# Patient Record
Sex: Male | Born: 1955
Health system: Southern US, Community
[De-identification: ages and names within clinical notes are randomized; demographics above are authoritative.]

## PROBLEM LIST (undated history)

## (undated) DIAGNOSIS — B1920 Unspecified viral hepatitis C without hepatic coma: Secondary | ICD-10-CM

## (undated) DIAGNOSIS — I10 Essential (primary) hypertension: Secondary | ICD-10-CM

## (undated) DIAGNOSIS — R0602 Shortness of breath: Secondary | ICD-10-CM

## (undated) DIAGNOSIS — F329 Major depressive disorder, single episode, unspecified: Secondary | ICD-10-CM

## (undated) DIAGNOSIS — D696 Thrombocytopenia, unspecified: Secondary | ICD-10-CM

## (undated) DIAGNOSIS — R9431 Abnormal electrocardiogram [ECG] [EKG]: Secondary | ICD-10-CM

## (undated) DIAGNOSIS — I864 Gastric varices: Secondary | ICD-10-CM

## (undated) DIAGNOSIS — K4090 Unilateral inguinal hernia, without obstruction or gangrene, not specified as recurrent: Secondary | ICD-10-CM

## (undated) DIAGNOSIS — R188 Other ascites: Secondary | ICD-10-CM

## (undated) DIAGNOSIS — K7460 Unspecified cirrhosis of liver: Secondary | ICD-10-CM

## (undated) DIAGNOSIS — C801 Malignant (primary) neoplasm, unspecified: Secondary | ICD-10-CM

## (undated) DIAGNOSIS — F32A Depression, unspecified: Secondary | ICD-10-CM

## (undated) DIAGNOSIS — F1020 Alcohol dependence, uncomplicated: Secondary | ICD-10-CM

## (undated) DIAGNOSIS — I8500 Esophageal varices without bleeding: Secondary | ICD-10-CM

## (undated) DIAGNOSIS — H9190 Unspecified hearing loss, unspecified ear: Secondary | ICD-10-CM

## (undated) HISTORY — DX: Esophageal varices without bleeding: I85.00

## (undated) HISTORY — PX: CIRCUMCISION: SUR203

## (undated) HISTORY — PX: COLONOSCOPY: SHX174

## (undated) HISTORY — DX: Unspecified cirrhosis of liver: K74.60

## (undated) HISTORY — DX: Gastric varices: I86.4

---

## 1998-05-26 ENCOUNTER — Emergency Department (HOSPITAL_COMMUNITY): Admission: EM | Admit: 1998-05-26 | Discharge: 1998-05-26 | Payer: Self-pay | Admitting: Emergency Medicine

## 1998-05-28 ENCOUNTER — Emergency Department (HOSPITAL_COMMUNITY): Admission: EM | Admit: 1998-05-28 | Discharge: 1998-05-28 | Payer: Self-pay | Admitting: Emergency Medicine

## 1999-05-15 ENCOUNTER — Emergency Department (HOSPITAL_COMMUNITY): Admission: EM | Admit: 1999-05-15 | Discharge: 1999-05-15 | Payer: Self-pay | Admitting: *Deleted

## 1999-05-16 ENCOUNTER — Encounter: Payer: Self-pay | Admitting: *Deleted

## 1999-05-16 ENCOUNTER — Encounter: Payer: Self-pay | Admitting: Emergency Medicine

## 1999-05-16 ENCOUNTER — Ambulatory Visit (HOSPITAL_COMMUNITY): Admission: RE | Admit: 1999-05-16 | Discharge: 1999-05-16 | Payer: Self-pay | Admitting: *Deleted

## 1999-05-16 ENCOUNTER — Emergency Department (HOSPITAL_COMMUNITY): Admission: EM | Admit: 1999-05-16 | Discharge: 1999-05-16 | Payer: Self-pay | Admitting: Emergency Medicine

## 2004-02-20 ENCOUNTER — Encounter: Admission: RE | Admit: 2004-02-20 | Discharge: 2004-02-20 | Payer: Self-pay | Admitting: Internal Medicine

## 2004-06-25 ENCOUNTER — Ambulatory Visit: Payer: Self-pay | Admitting: Internal Medicine

## 2005-07-04 ENCOUNTER — Ambulatory Visit: Payer: Self-pay | Admitting: Internal Medicine

## 2005-08-15 ENCOUNTER — Ambulatory Visit (HOSPITAL_COMMUNITY): Admission: RE | Admit: 2005-08-15 | Discharge: 2005-08-15 | Payer: Self-pay | Admitting: Internal Medicine

## 2005-08-15 ENCOUNTER — Encounter (INDEPENDENT_AMBULATORY_CARE_PROVIDER_SITE_OTHER): Payer: Self-pay | Admitting: Specialist

## 2005-08-15 ENCOUNTER — Encounter: Payer: Self-pay | Admitting: Internal Medicine

## 2005-08-15 ENCOUNTER — Ambulatory Visit: Payer: Self-pay | Admitting: Internal Medicine

## 2005-08-21 ENCOUNTER — Ambulatory Visit: Payer: Self-pay | Admitting: Internal Medicine

## 2005-08-21 LAB — CBC WITH DIFFERENTIAL/PLATELET
BASO%: 0.5 % (ref 0.0–2.0)
EOS%: 1.9 % (ref 0.0–7.0)
MCH: 35.6 pg — ABNORMAL HIGH (ref 28.0–33.4)
MCHC: 34.8 g/dL (ref 32.0–35.9)
MCV: 102.3 fL — ABNORMAL HIGH (ref 81.6–98.0)
MONO%: 20.2 % — ABNORMAL HIGH (ref 0.0–13.0)
NEUT%: 44.4 % (ref 40.0–75.0)
RDW: 14.1 % (ref 11.2–14.6)
lymph#: 1.3 10*3/uL (ref 0.9–3.3)

## 2005-08-28 LAB — HEMOCHROMATOSIS DNA-PCR(C282Y,H63D)

## 2005-10-10 ENCOUNTER — Ambulatory Visit: Payer: Self-pay | Admitting: Internal Medicine

## 2005-10-13 LAB — CBC WITH DIFFERENTIAL/PLATELET
BASO%: 0.7 % (ref 0.0–2.0)
EOS%: 2.1 % (ref 0.0–7.0)
LYMPH%: 37.2 % (ref 14.0–48.0)
MCH: 35.8 pg — ABNORMAL HIGH (ref 28.0–33.4)
MCHC: 34.9 g/dL (ref 32.0–35.9)
MCV: 102.6 fL — ABNORMAL HIGH (ref 81.6–98.0)
MONO#: 0.5 10*3/uL (ref 0.1–0.9)
MONO%: 15.6 % — ABNORMAL HIGH (ref 0.0–13.0)
Platelets: 40 10*3/uL — ABNORMAL LOW (ref 145–400)
RBC: 4.15 10*6/uL — ABNORMAL LOW (ref 4.20–5.71)
WBC: 3.2 10*3/uL — ABNORMAL LOW (ref 4.0–10.0)

## 2005-10-17 LAB — HEMOCHROMATOSIS DNA-PCR(C282Y,H63D)

## 2005-10-17 LAB — COMPREHENSIVE METABOLIC PANEL
BUN: 12 mg/dL (ref 6–23)
CO2: 25 mEq/L (ref 19–32)
Calcium: 8.8 mg/dL (ref 8.4–10.5)
Creatinine, Ser: 0.82 mg/dL (ref 0.40–1.50)
Glucose, Bld: 145 mg/dL — ABNORMAL HIGH (ref 70–99)
Total Bilirubin: 1.1 mg/dL (ref 0.3–1.2)

## 2005-10-17 LAB — LACTATE DEHYDROGENASE: LDH: 153 U/L (ref 94–250)

## 2005-10-17 LAB — HIV ANTIBODY (ROUTINE TESTING W REFLEX)

## 2006-06-17 ENCOUNTER — Ambulatory Visit: Payer: Self-pay | Admitting: Internal Medicine

## 2008-04-07 ENCOUNTER — Emergency Department (HOSPITAL_COMMUNITY): Admission: EM | Admit: 2008-04-07 | Discharge: 2008-04-07 | Payer: Self-pay | Admitting: Emergency Medicine

## 2008-09-16 ENCOUNTER — Emergency Department (HOSPITAL_COMMUNITY): Admission: EM | Admit: 2008-09-16 | Discharge: 2008-09-16 | Payer: Self-pay | Admitting: Emergency Medicine

## 2010-08-06 LAB — POCT I-STAT, CHEM 8
BUN: 10 mg/dL (ref 6–23)
Calcium, Ion: 1.03 mmol/L — ABNORMAL LOW (ref 1.12–1.32)
Chloride: 104 mEq/L (ref 96–112)
Creatinine, Ser: 1.1 mg/dL (ref 0.4–1.5)
Glucose, Bld: 117 mg/dL — ABNORMAL HIGH (ref 70–99)
HCT: 40 % (ref 39.0–52.0)
Hemoglobin: 13.6 g/dL (ref 13.0–17.0)
Potassium: 3.9 mEq/L (ref 3.5–5.1)
Sodium: 135 mEq/L (ref 135–145)
TCO2: 25 mmol/L (ref 0–100)

## 2013-04-25 ENCOUNTER — Encounter (HOSPITAL_COMMUNITY): Payer: Self-pay | Admitting: Emergency Medicine

## 2013-04-25 ENCOUNTER — Inpatient Hospital Stay (HOSPITAL_COMMUNITY)
Admission: EM | Admit: 2013-04-25 | Discharge: 2013-04-29 | DRG: 433 | Disposition: A | Discharge status: Home / Self Care | Payer: Medicaid Other | Attending: Internal Medicine | Admitting: Internal Medicine

## 2013-04-25 ENCOUNTER — Emergency Department (INDEPENDENT_AMBULATORY_CARE_PROVIDER_SITE_OTHER)
Admission: EM | Admit: 2013-04-25 | Discharge: 2013-04-25 | Disposition: A | Discharge status: Home / Self Care | Payer: Medicaid Other | Source: Home / Self Care | Attending: Emergency Medicine | Admitting: Emergency Medicine

## 2013-04-25 DIAGNOSIS — N390 Urinary tract infection, site not specified: Secondary | ICD-10-CM

## 2013-04-25 DIAGNOSIS — K7290 Hepatic failure, unspecified without coma: Secondary | ICD-10-CM

## 2013-04-25 DIAGNOSIS — R188 Other ascites: Secondary | ICD-10-CM | POA: Diagnosis present

## 2013-04-25 DIAGNOSIS — F1010 Alcohol abuse, uncomplicated: Secondary | ICD-10-CM | POA: Diagnosis present

## 2013-04-25 DIAGNOSIS — K7030 Alcoholic cirrhosis of liver without ascites: Principal | ICD-10-CM | POA: Diagnosis present

## 2013-04-25 DIAGNOSIS — F17200 Nicotine dependence, unspecified, uncomplicated: Secondary | ICD-10-CM | POA: Diagnosis present

## 2013-04-25 DIAGNOSIS — K7460 Unspecified cirrhosis of liver: Secondary | ICD-10-CM

## 2013-04-25 DIAGNOSIS — B1920 Unspecified viral hepatitis C without hepatic coma: Secondary | ICD-10-CM

## 2013-04-25 DIAGNOSIS — F1410 Cocaine abuse, uncomplicated: Secondary | ICD-10-CM | POA: Diagnosis present

## 2013-04-25 DIAGNOSIS — R601 Generalized edema: Secondary | ICD-10-CM

## 2013-04-25 DIAGNOSIS — I10 Essential (primary) hypertension: Secondary | ICD-10-CM

## 2013-04-25 DIAGNOSIS — L209 Atopic dermatitis, unspecified: Secondary | ICD-10-CM | POA: Diagnosis present

## 2013-04-25 DIAGNOSIS — L2089 Other atopic dermatitis: Secondary | ICD-10-CM | POA: Diagnosis present

## 2013-04-25 DIAGNOSIS — D696 Thrombocytopenia, unspecified: Secondary | ICD-10-CM

## 2013-04-25 DIAGNOSIS — D684 Acquired coagulation factor deficiency: Secondary | ICD-10-CM | POA: Diagnosis present

## 2013-04-25 DIAGNOSIS — R609 Edema, unspecified: Secondary | ICD-10-CM

## 2013-04-25 DIAGNOSIS — Z23 Encounter for immunization: Secondary | ICD-10-CM

## 2013-04-25 HISTORY — DX: Unspecified viral hepatitis C without hepatic coma: B19.20

## 2013-04-25 HISTORY — DX: Essential (primary) hypertension: I10

## 2013-04-25 HISTORY — DX: Other ascites: R18.8

## 2013-04-25 NOTE — ED Provider Notes (Signed)
Chief Complaint:   Chief Complaint  Patient presents with  . Leg Swelling  . Groin Swelling    History of Present Illness:   Jose [name] is a 57 year old male with hepatitis C and ongoing alcohol intake who presents tonight with a 2 to three-month history of swelling of both of his legs, right more so than left. He also notes swelling of his abdomen and tightness and over the past 2 days has had swelling of his scrotum and penis. The scrotal swelling is painful but he denies any abdominal or leg pain. He's had no fever, chills, nausea, or vomiting. He's had some cramping in his legs. He does not have a primary care physician and states he has never received any treatment for hepatitis C. He admits to drinking about 2-3 beers per day plus an unknown amount liquor.  Review of Systems:  Other than noted above, the patient denies any of the following symptoms: Systemic:  No fever, chills, sweats, weight gain or loss. Respiratory:  No coughing, wheezing, or shortness of breath. Cardiac:  No chest pain, tightness, pressure or syncope. GI:  No abdominal pain, swelling, distension, nausea, or vomiting. GU:  No dysuria, frequency, or hematuria. Ext:  No joint pain, muscle pain, or weakness. Skin:  No rash or itching. Neuro:  No paresthesias.  PMFSH:  Past medical history, family history, social history, meds, and allergies were reviewed.  The patient states he's had high blood pressure in the past but is not on any treatment for right now.  Physical Exam:   Vital signs:  BP 152/75  Pulse 60  Temp(Src) 98.3 F (36.8 C) (Oral)  Resp 14  SpO2 98% Gen:  Alert, oriented, in no distress. He has scleral icterus and fetor hepatis.  Neck:  No tenderness, adenopathy, or JVD. Lungs:  Breath sounds clear and equal bilaterally.  No rales, rhonchi or wheezes. Heart:  Regular rhythm, no gallops or murmers. Abdomen:  Was distended, tight, but not tender to palpation. Bowel sounds were normal. Genital exam:  He has massive swelling of the scrotum and penis. Ext:  He has pitting edema of both legs, right more so than left. Neuro:  Alert and oriented times 3.  No muscle weakness.  Sensation intact to light touch. Skin:  Warm and dry.  No rash or skin lesions.  Assessment:  The primary encounter diagnosis was Cirrhosis. Diagnoses of Ascites and Edema were also pertinent to this visit.  He will need diuresis and medical monitoring. He does not have a medical home, therefore will probably need hospitalization.  Plan:  The patient was transferred to the ED via shuttle in stable condition.  Medical Decision Making: 57 year old male with history of hepatitis C and ongoing alcohol intake presents tonight with a 2 to 3 month history of swelling of both legs, right greater than left, scrotum, and abdomen.  He denies fever, abdominal pain, or vomiting.  He has no primary care physician.  On exam he has ascites, scleral icterus, massive edema of his scrotum, and edema of both legs.  My impression is cirrhosis and he will need to be admitted for diuresis.        Reuben Likes, MD 04/25/13 [phone]

## 2013-04-25 NOTE — ED Notes (Signed)
Reports swelling of right leg and knee for over 2 to 3 months. States has gotten worse over the past week.   Pt is also having swelling in groin area over the last couple of days that is gradually getting worse daily.  No otc meds taken.  Hx of hypertension.

## 2013-04-25 NOTE — ED Notes (Signed)
Pt is an [facility] transfer.  Per [facility] papers, pt with gross swelling to scrotum and bilateral legs.  Pt states swelling has been ongoing for a couple months.

## 2013-04-26 ENCOUNTER — Inpatient Hospital Stay (HOSPITAL_COMMUNITY): Payer: Medicaid Other

## 2013-04-26 ENCOUNTER — Emergency Department (HOSPITAL_COMMUNITY): Payer: Medicaid Other

## 2013-04-26 ENCOUNTER — Encounter (HOSPITAL_COMMUNITY): Payer: Self-pay | Admitting: Internal Medicine

## 2013-04-26 DIAGNOSIS — R601 Generalized edema: Secondary | ICD-10-CM | POA: Diagnosis present

## 2013-04-26 DIAGNOSIS — M7989 Other specified soft tissue disorders: Secondary | ICD-10-CM

## 2013-04-26 DIAGNOSIS — R609 Edema, unspecified: Secondary | ICD-10-CM

## 2013-04-26 DIAGNOSIS — B1920 Unspecified viral hepatitis C without hepatic coma: Secondary | ICD-10-CM

## 2013-04-26 DIAGNOSIS — K769 Liver disease, unspecified: Secondary | ICD-10-CM

## 2013-04-26 DIAGNOSIS — I10 Essential (primary) hypertension: Secondary | ICD-10-CM

## 2013-04-26 DIAGNOSIS — N390 Urinary tract infection, site not specified: Secondary | ICD-10-CM | POA: Diagnosis present

## 2013-04-26 LAB — CBC WITH DIFFERENTIAL/PLATELET
Basophils Absolute: 0 10*3/uL (ref 0.0–0.1)
Eosinophils Absolute: 0.1 10*3/uL (ref 0.0–0.7)
Eosinophils Relative: 2 % (ref 0–5)
HCT: 38.3 % — ABNORMAL LOW (ref 39.0–52.0)
Lymphocytes Relative: 35 % (ref 12–46)
MCH: 39.1 pg — ABNORMAL HIGH (ref 26.0–34.0)
MCHC: 35.8 g/dL (ref 30.0–36.0)
MCV: 109.4 fL — ABNORMAL HIGH (ref 78.0–100.0)
Monocytes Absolute: 0.5 10*3/uL (ref 0.1–1.0)
Monocytes Relative: 14 % — ABNORMAL HIGH (ref 3–12)
Platelets: 35 10*3/uL — ABNORMAL LOW (ref 150–400)
RDW: 16.4 % — ABNORMAL HIGH (ref 11.5–15.5)
WBC: 3.6 10*3/uL — ABNORMAL LOW (ref 4.0–10.5)

## 2013-04-26 LAB — URINALYSIS, ROUTINE W REFLEX MICROSCOPIC
Ketones, ur: NEGATIVE mg/dL
Nitrite: NEGATIVE
Nitrite: POSITIVE — AB
Protein, ur: NEGATIVE mg/dL
Protein, ur: NEGATIVE mg/dL
Specific Gravity, Urine: 1.025 (ref 1.005–1.030)
Urobilinogen, UA: >8 mg/dL — ABNORMAL HIGH (ref 0.0–1.0)
Urobilinogen, UA: 4 mg/dL — ABNORMAL HIGH (ref 0.0–1.0)

## 2013-04-26 LAB — COMPREHENSIVE METABOLIC PANEL
AST: 139 U/L — ABNORMAL HIGH (ref 0–37)
BUN: 10 mg/dL (ref 6–23)
CO2: 26 mEq/L (ref 19–32)
Calcium: 7.8 mg/dL — ABNORMAL LOW (ref 8.4–10.5)
Creatinine, Ser: 0.85 mg/dL (ref 0.50–1.35)
GFR calc Af Amer: >90 mL/min (ref >90)
GFR calc non Af Amer: >90 mL/min (ref >90)
Glucose, Bld: 116 mg/dL — ABNORMAL HIGH (ref 70–99)
Sodium: 140 mEq/L (ref 137–147)
Total Bilirubin: 2 mg/dL — ABNORMAL HIGH (ref 0.3–1.2)

## 2013-04-26 LAB — URINE MICROSCOPIC-ADD ON

## 2013-04-26 LAB — RAPID URINE DRUG SCREEN, HOSP PERFORMED
Amphetamines: NOT DETECTED
Barbiturates: NOT DETECTED
Tetrahydrocannabinol: NOT DETECTED

## 2013-04-26 LAB — POTASSIUM: Potassium: 4.4 mEq/L (ref 3.7–5.3)

## 2013-04-26 LAB — HEPATITIS PANEL, ACUTE
HCV Ab: REACTIVE — AB
Hepatitis B Surface Ag: NEGATIVE

## 2013-04-26 LAB — AMMONIA: Ammonia: 79 umol/L — ABNORMAL HIGH (ref 11–60)

## 2013-04-26 LAB — ETHANOL: Alcohol, Ethyl (B): <11 mg/dL (ref 0–11)

## 2013-04-26 MED ORDER — VITAMIN B-1 100 MG PO TABS
100.0000 mg | ORAL_TABLET | Freq: Every day | ORAL | Status: DC
  Administered 2013-04-26 – 2013-04-29 (×4): 100 mg via ORAL
  Filled 2013-04-26 (×4): qty 1

## 2013-04-26 MED ORDER — LORAZEPAM 1 MG PO TABS
1.0000 mg | ORAL_TABLET | Freq: Four times a day (QID) | ORAL | Status: AC | PRN
  Administered 2013-04-27: 1 mg via ORAL
  Filled 2013-04-26: qty 1

## 2013-04-26 MED ORDER — DEXTROSE 5 % IV SOLN
1.0000 g | INTRAVENOUS | Status: DC
  Administered 2013-04-26 – 2013-04-27 (×2): 1 g via INTRAVENOUS
  Filled 2013-04-26 (×3): qty 10

## 2013-04-26 MED ORDER — NICOTINE 21 MG/24HR TD PT24
21.0000 mg | MEDICATED_PATCH | Freq: Every day | TRANSDERMAL | Status: DC
  Administered 2013-04-27 – 2013-04-29 (×3): 21 mg via TRANSDERMAL
  Filled 2013-04-26 (×4): qty 1

## 2013-04-26 MED ORDER — LORAZEPAM 2 MG/ML IJ SOLN: 1.0000 mg | Freq: Four times a day (QID) | INTRAMUSCULAR | Status: AC | PRN

## 2013-04-26 MED ORDER — FOLIC ACID 1 MG PO TABS
1.0000 mg | ORAL_TABLET | Freq: Every day | ORAL | Status: DC
  Administered 2013-04-26 – 2013-04-29 (×4): 1 mg via ORAL
  Filled 2013-04-26 (×4): qty 1

## 2013-04-26 MED ORDER — THIAMINE HCL 100 MG/ML IJ SOLN
100.0000 mg | Freq: Every day | INTRAMUSCULAR | Status: DC
  Filled 2013-04-26: qty 1

## 2013-04-26 MED ORDER — ADULT MULTIVITAMIN W/MINERALS CH
1.0000 | ORAL_TABLET | Freq: Every day | ORAL | Status: DC
  Administered 2013-04-26 – 2013-04-29 (×4): 1 via ORAL
  Filled 2013-04-26 (×4): qty 1

## 2013-04-26 MED ORDER — LACTULOSE 10 GM/15ML PO SOLN
20.0000 g | Freq: Every day | ORAL | Status: DC
  Administered 2013-04-26 – 2013-04-29 (×4): 20 g via ORAL
  Filled 2013-04-26 (×4): qty 30

## 2013-04-26 MED ORDER — ONDANSETRON HCL 4 MG PO TABS: 4.0000 mg | ORAL_TABLET | Freq: Four times a day (QID) | ORAL | Status: DC | PRN

## 2013-04-26 MED ORDER — ONDANSETRON HCL 4 MG/2ML IJ SOLN: 4.0000 mg | Freq: Four times a day (QID) | INTRAMUSCULAR | Status: DC | PRN

## 2013-04-26 MED ORDER — FUROSEMIDE 10 MG/ML IJ SOLN
40.0000 mg | Freq: Three times a day (TID) | INTRAMUSCULAR | Status: DC
  Administered 2013-04-26 – 2013-04-27 (×5): 40 mg via INTRAVENOUS
  Filled 2013-04-26 (×7): qty 4

## 2013-04-26 MED ORDER — INFLUENZA VAC SPLIT QUAD 0.5 ML IM SUSP
0.5000 mL | INTRAMUSCULAR | Status: AC
  Administered 2013-04-27: 0.5 mL via INTRAMUSCULAR
  Filled 2013-04-26: qty 0.5

## 2013-04-26 NOTE — ED Provider Notes (Signed)
CSN: [id number]     Arrival date & time 04/25/13  1818 History   First MD Initiated Contact with Patient 04/25/13 2356     Chief Complaint  Patient presents with  . Leg Swelling  . Groin Swelling   (Consider location/radiation/quality/duration/timing/severity/associated sxs/prior Treatment) HPI Comments: 57 year old male with a history of hepatitis C, heavy alcohol use and hypertension who presents from the urgent care with a complaint of bilateral lower extremity swelling and scrotal swelling. He states that he has had chronic right lower extremity swelling, recently the swelling has spread to the left leg and 3 days ago the swelling extended into the scrotum and onto the abdominal wall. This is gradually getting worse, nothing makes it better or worse, he has associated mild shortness of breath when he lays supine, no chest pain, no fever, no cough. He states that he used intravenous drug use when he was younger, he postulates this is how he obtained hepatitis C. According to the medical record the patient has had no recent visits to the hospital in many years, no recent laboratory workup or imaging.  The history is provided by the patient and medical records.    Past Medical History  Diagnosis Date  . Hypertension    History reviewed. No pertinent past surgical history. No family history on file. History  Substance Use Topics  . Smoking status: Current Every Day Smoker -- 0.50 packs/day    Types: Cigarettes  . Smokeless tobacco: Not on file  . Alcohol Use: Yes     Comment: "couple beers a day"    Review of Systems  All other systems reviewed and are negative.    Allergies  Review of patient's allergies indicates no known allergies.  Home Medications   Current Outpatient Rx  Name  Route  Sig  Dispense  Refill  . Naproxen Sodium (ALEVE PO)   Oral   Take 220 mg by mouth every 6 (six) hours as needed (pain).          BP 138/65  Pulse 55  Temp(Src) 98.1 F (36.7 C)  (Oral)  Resp 18  Ht 5\' 6"  (1.676 m)  Wt 241 lb (109.317 kg)  BMI 38.92 kg/m2  SpO2 100% Physical Exam  Nursing note and vitals reviewed. Constitutional: He appears well-developed and well-nourished. No distress.  HENT:  Head: Normocephalic and atraumatic.  Mouth/Throat: Oropharynx is clear and moist. No oropharyngeal exudate.  Eyes: Conjunctivae and EOM are normal. Pupils are equal, round, and reactive to light. Right eye exhibits no discharge. Left eye exhibits no discharge. No scleral icterus.  Neck: Normal range of motion. Neck supple. No JVD present. No thyromegaly present.  Cardiovascular: Normal rate, regular rhythm, normal heart sounds and intact distal pulses.  Exam reveals no gallop and no friction rub.   No murmur heard. Pulmonary/Chest: Effort normal and breath sounds normal. No respiratory distress. He has no wheezes. He has no rales.  Abdominal: Soft. Bowel sounds are normal. He exhibits distension. He exhibits no mass. There is no tenderness.  Distended tight nontender abdomen with anasarca to the diaphragm  Genitourinary:  Scrotum approximately the size of a cantaloupe, nontender, edematous. Right inguinal canal appears full intense, left inguinal canal soft, supple and nontender  Musculoskeletal: Normal range of motion. He exhibits edema. He exhibits no tenderness.  Bilateral lower extremity edema pitting, right greater than left  Lymphadenopathy:    He has no cervical adenopathy.  Neurological: He is alert. Coordination normal.  Skin: Skin is warm and  dry. No rash noted. No erythema.  Psychiatric: He has a normal mood and affect. His behavior is normal.    ED Course  Procedures (including critical care time) Labs Review Labs Reviewed  CBC WITH DIFFERENTIAL - Abnormal; Notable for the following:    WBC 3.6 (*)    RBC 3.50 (*)    HCT 38.3 (*)    MCV 109.4 (*)    MCH 39.1 (*)    RDW 16.4 (*)    Platelets 35 (*)    Monocytes Relative 14 (*)    All other  components within normal limits  COMPREHENSIVE METABOLIC PANEL - Abnormal; Notable for the following:    Glucose, Bld 116 (*)    Calcium 7.8 (*)    Albumin 1.8 (*)    AST 139 (*)    ALT 59 (*)    Alkaline Phosphatase 240 (*)    Total Bilirubin 2.0 (*)    All other components within normal limits  URINALYSIS, ROUTINE W REFLEX MICROSCOPIC - Abnormal; Notable for the following:    Color, Urine ORANGE (*)    APPearance CLOUDY (*)    Hgb urine dipstick MODERATE (*)    Bilirubin Urine SMALL (*)    Ketones, ur 15 (*)    Urobilinogen, UA >8.0 (*)    Leukocytes, UA MODERATE (*)    All other components within normal limits  AMMONIA - Abnormal; Notable for the following:    Ammonia 79 (*)    All other components within normal limits  URINE CULTURE  URINE MICROSCOPIC-ADD ON  ETHANOL  URINALYSIS, ROUTINE W REFLEX MICROSCOPIC   Imaging Review Ct Abdomen Pelvis Wo Contrast  04/26/2013   CLINICAL DATA:  Leg swelling for 3 months. Swelling in the groin area for 2 days.  EXAM: CT ABDOMEN AND PELVIS WITHOUT CONTRAST  TECHNIQUE: Multidetector CT imaging of the abdomen and pelvis was performed following the standard protocol without intravenous contrast.  COMPARISON:  None.  FINDINGS: Visualization of lung fields is limited due to respiratory motion artifact.  Visualization of solid organs and vascular structures is limited due noncontrast imaging. There are Coronary artery calcifications. The liver demonstrates changes of cirrhosis. Multiple stones in the gallbladder with gallbladder contraction. Free fluid in the upper abdomen around the liver, in the right and left lower quadrant, and extending down to the pelvis consistent with ascites. Spleen is mildly enlarged. An accessory spleen is present. Multiple varices are demonstrated throughout the upper abdomen and in the paraesophageal region. The unenhanced appearance of the pancreas, adrenal glands, kidneys, inferior vena cava, and retroperitoneal lymph  nodes is unremarkable. Calcification of the abdominal aorta without aneurysm. The stomach, small bowel, and colon are not abnormally distended. No free air in the abdomen.  Pelvis: There is a right inguinal hernia with fluid extending into the right scrotum. Prostate gland is mildly enlarged. Bladder wall is mildly thickened, likely due to incomplete distention. No evidence of diverticulitis. The appendix is normal. Degenerative changes in the spine.  IMPRESSION: Changes of hepatic cirrhosis with splenic enlargement and diffuse abdominal and pelvic ascites. Multiple upper abdominal and paraesophageal varices. Right inguinal hernia containing ascitic fluid. Cholelithiasis with contracted gallbladder.   Electronically Signed   By: Burman Nieves M.D.   On: 04/26/2013 02:33    EKG Interpretation   None       MDM   1. Liver failure   2. Anasarca    The patient does appear to have severe edema, I suspect this is related to  liver failure. Absolutely need evaluation and treatment for his increased fluid accumulation. He also needs imaging of his abdomen to evaluate for possible hernia in the right scrotum. Labs pending, the patient does not appear to be in acute distress and vital signs are reassuring.  Laboratory workup shows that the patient does have slight dehydration given ketonuria, urine appears contaminated which is not surprising given the patient's genitalia exam. He is not anemic and does not have a leukocytosis but he does have a transaminitis as well as an increased ammonia level.  CT scan of the abdomen shows that the patient does have hepatic cirrhosis, diffuse ascites as well as a right inguinal hernia that contains ascites. This was discussed with the hospitalist to admit to the hospital. The patient appears medically stable  Vida Roller, MD 04/26/13 [phone]

## 2013-04-26 NOTE — Progress Notes (Signed)
VASCULAR LAB PRELIMINARY  PRELIMINARY  PRELIMINARY  PRELIMINARY  Right lower extremity venous duplex completed.    Preliminary report:  Right:  No evidence of DVT, superficial thrombosis, or Baker's cyst.  Jose [name], RVS 04/26/2013, 6:03 PM

## 2013-04-26 NOTE — Progress Notes (Signed)
[facility] PROGRESS NOTE  Jose [name] ZOX:[id number] DOB: 1955/06/29 DOA: 04/25/2013 PCP: No PCP Per Patient  STONEY KARCZEWSKI is a 57 y.o. African American male with history of hepatitis C and hypertension not on any medications who presents with the above complaints. Patient reports that he has had increased swelling in the last 2-3 months, however in the last 3-4 days he is noted increased swelling in lower extremities, abdomen, and his scrotum.  Assessment/Plan:  Swelling Patient has ascites, scrotal and lower extremity edema from cirrhosis Albumin 1.8 Currently on Lasix 40 mg TID.  Urine output is good. 2D echo pending GI consulted.  UTI U/A appears infected Cultures pending Started on Rocephin  Alcohol abuse with alcoholic coagulopathy Patient counseled.  States his last drink was Sunday.  No history of withdraw sxs. LFTs elevated in alcoholic pattern.  Thrombocytopenia (platlets 35).  MCV 109. Ammonia level slightly elevated.  Start low dose lactulose. CIWA protocol Placed on folic acid and thiamine.  Cocaine positive Will consult social work for resources.  Hep C Cirrhosis HCV ab reactive. Patient reports he has never had treatment for hep c. Have consulted GI - Appreciate any recommendations Dr. Elnoria Howard can offer.  Hypertension BP vacillates.  Pulse rate low. Continue to monitor.    Right leg swelling significantly greater than left While platelets are very low, will still check doppler to rule out DVT.  Rash Area of black induration on right lower extremity. Skin care per floor RN.  DVT Prophylaxis:  Coagulopathic.  Platelets 35.  Code Status: full Family Communication: patient alert and orientated. Disposition Plan: to friends home when appropriate.   Consultants:  Dr. Elnoria Howard, GI  Procedures:    Antibiotics:  Rocephin  HPI/Subjective: Swelling started 3 days ago, made it difficult to walk.  Breathing fine.  Objective: Filed Vitals:    04/26/13 0732 04/26/13 0845 04/26/13 0930 04/26/13 0942  BP: 161/94 151/100 151/94 151/94  Pulse: 52 53 56 55  Temp:      TempSrc:      Resp: 17   17  Height:      Weight:      SpO2: 99% 97% 96% 99%    Intake/Output Summary (Last 24 hours) at 04/26/13 1519 Last data filed at 04/26/13 8119  Gross per 24 hour  Intake      0 ml  Output    750 ml  Net   -750 ml   Filed Weights   04/25/13 1836  Weight: 109.317 kg (241 lb)    Exam: General: Well developed, well nourished, NAD, appears older than stated age  HEENT:  PERR, EOMI, 2+ icteic Sclera, MMM. No pharyngeal erythema or exudates  Neck: Supple, no JVD, no masses  Cardiovascular: RRR, S1 S2, +systolic murmur Respiratory: Clear to auscultation bilaterally with equal chest rise  Abdomen: Soft, nontender, protuberant, + bowel sounds  Extremities: warm dry without cyanosis clubbing or edema.  Neuro: AAOx3, cranial nerves grossly intact. Strength 5/5 in upper and lower extremities  Skin: dry skin thru out, patch of black induration on right lower ext. Psych: Normal affect and demeanor with intact judgement and insight       Data Reviewed: Basic Metabolic Panel:  Recent Labs Lab 04/26/13 0010  NA 140  K 3.9  CL 105  CO2 26  GLUCOSE 116*  BUN 10  CREATININE 0.85  CALCIUM 7.8*   Liver Function Tests:  Recent Labs Lab 04/26/13 0010  AST 139*  ALT 59*  ALKPHOS 240*  BILITOT 2.0*  PROT 7.0  ALBUMIN 1.8*    Recent Labs Lab 04/26/13 0010  AMMONIA 79*   CBC:  Recent Labs Lab 04/26/13 0010  WBC 3.6*  NEUTROABS 1.7  HGB 13.7  HCT 38.3*  MCV 109.4*  PLT 35*     Studies: Ct Abdomen Pelvis Wo Contrast  04/26/2013   CLINICAL DATA:  Leg swelling for 3 months. Swelling in the groin area for 2 days.  EXAM: CT ABDOMEN AND PELVIS WITHOUT CONTRAST  TECHNIQUE: Multidetector CT imaging of the abdomen and pelvis was performed following the standard protocol without intravenous contrast.  COMPARISON:  None.   FINDINGS: Visualization of lung fields is limited due to respiratory motion artifact.  Visualization of solid organs and vascular structures is limited due noncontrast imaging. There are Coronary artery calcifications. The liver demonstrates changes of cirrhosis. Multiple stones in the gallbladder with gallbladder contraction. Free fluid in the upper abdomen around the liver, in the right and left lower quadrant, and extending down to the pelvis consistent with ascites. Spleen is mildly enlarged. An accessory spleen is present. Multiple varices are demonstrated throughout the upper abdomen and in the paraesophageal region. The unenhanced appearance of the pancreas, adrenal glands, kidneys, inferior vena cava, and retroperitoneal lymph nodes is unremarkable. Calcification of the abdominal aorta without aneurysm. The stomach, small bowel, and colon are not abnormally distended. No free air in the abdomen.  Pelvis: There is a right inguinal hernia with fluid extending into the right scrotum. Prostate gland is mildly enlarged. Bladder wall is mildly thickened, likely due to incomplete distention. No evidence of diverticulitis. The appendix is normal. Degenerative changes in the spine.  IMPRESSION: Changes of hepatic cirrhosis with splenic enlargement and diffuse abdominal and pelvic ascites. Multiple upper abdominal and paraesophageal varices. Right inguinal hernia containing ascitic fluid. Cholelithiasis with contracted gallbladder.   Electronically Signed   By: Burman Nieves M.D.   On: 04/26/2013 02:33   US Abdomen Complete  04/26/2013   CLINICAL DATA:  Anasarca.  Assess for cirrhosis.  EXAM: ULTRASOUND ABDOMEN COMPLETE  COMPARISON:  CT 04/26/2013  FINDINGS: Gallbladder:  The gallbladder is contracted and contains multiple stones. Wall thickness measures 5 mm. No Murphy sign.  Common bile duct:  Diameter: Normal at 5.7 mm.  Liver:  Findings consistent with cirrhosis. Lobular margins with prominent left lobe.  Slow flow in the portal vein which remains hepatopetal. No evidence of focal mass lesion.  IVC:  No abnormality visualized.  Pancreas:  Poorly seen because of overlying bowel gas  Spleen:  Upper limits of normal with a length of 9 cm.  Right Kidney:  Length: 14.2 cm in length. No focal lesion. No hydronephrosis. Echogenicity within normal limits.  Left Kidney:  Length: 13.9 cm in length. No focal lesions or hydronephrosis. Echogenicity within normal limits.  Abdominal aorta:  Atherosclerosis without aneurysm.  Other findings:  Prominent diffuse ascites.  IMPRESSION: Findings consistent with cirrhosis of the liver and diffuse ascites. Slow flow in the portal vein which remains hepatopetal at the moment.  Multiple stones in a contracted gallbladder. Slight gallbladder wall thickening, frequently seen in the setting of ascites.   Electronically Signed   By: Paulina Fusi M.D.   On: 04/26/2013 08:34    Scheduled Meds: . folic acid  1 mg Oral Daily  . furosemide  40 mg Intravenous Q8H  . lactulose  20 g Oral Daily  . multivitamin with minerals  1 tablet Oral Daily  . nicotine  21 mg Transdermal  Daily  . thiamine  100 mg Oral Daily   Or  . thiamine  100 mg Intravenous Daily   Continuous Infusions: . cefTRIAXone (ROCEPHIN)  IV Stopped (04/26/13 9147)    Active Problems:   Anasarca   UTI (urinary tract infection)   Hypertension   Hepatitis C    [place]  [facility] Pager [phone]. If 7PM-7AM, please contact night-coverage at [email or web address], password [facility] 04/26/2013, 3:19 PM  LOS: 1 day

## 2013-04-26 NOTE — H&P (Signed)
Patient's PCP: No PCP Per Patient  Chief Complaint: Swelling  History of Present Illness: Jose [name] is a 57 y.o. African American male with history of hepatitis C and hypertension not on any medications who presents with the above complaints.  Patient reports that he has had increased swelling in the last 2-3 months, however in the last 3-4 days he is noted increased swelling in lower extremities, abdomen, and his scrotum.  He initially presented to urgent care, but was sent to the emergency department for further evaluation.  Hospitalist service was asked to admit the patient for generalized edema.  He denies any recent fevers, chills, nausea, vomiting, chest pain, abdominal pain, diarrhea, headaches or vision changes.  Does complain of minimal shortness of breath.  Review of Systems: All systems reviewed with the patient and positive as per history of present illness, otherwise all other systems are negative.  Past Medical History  Diagnosis Date  . Hypertension   . Hepatitis C    History reviewed. No pertinent past surgical history. Family History  Problem Relation Age of Onset  . Breast cancer Mother   . Diabetes Father    History   Social History  . Marital Status: Married    Spouse Name: N/A    Number of Children: N/A  . Years of Education: N/A   Occupational History  . Not on file.   Social History Main Topics  . Smoking status: Current Every Day Smoker -- 0.50 packs/day    Types: Cigarettes  . Smokeless tobacco: Not on file  . Alcohol Use: Yes     Comment: "couple beers a day"  . Drug Use: Yes    Special: Marijuana, Cocaine     Comment: Last week  . Sexual Activity: Not Currently   Other Topics Concern  . Not on file   Social History Narrative  . No narrative on file   Allergies: Review of patient's allergies indicates no known allergies.  Home Meds: Prior to Admission medications   Medication Sig Start Date End Date Taking? Authorizing Provider   Naproxen Sodium (ALEVE PO) Take 220 mg by mouth every 6 (six) hours as needed (pain).   Yes Historical Provider, MD    Physical Exam: Blood pressure 138/65, pulse 55, temperature 98.1 F (36.7 C), temperature source Oral, resp. rate 18, height 5\' 6"  (1.676 m), weight 109.317 kg (241 lb), SpO2 100.00%. General: Awake, Oriented x3, No acute distress. HEENT: EOMI, Moist mucous membranes Neck: Supple CV: S1 and S2 Lungs: Clear to ascultation bilaterally Abdomen: Soft, Nontender, Nondistended, +bowel sounds. Ext: Good pulses.  3-4+ pitting lower extremity edema. No clubbing or cyanosis noted. Neuro: Cranial Nerves II-XII grossly intact. Has 5/5 motor strength in upper and lower extremities.  Lab results:  Recent Labs  04/26/13 0010  NA 140  K 3.9  CL 105  CO2 26  GLUCOSE 116*  BUN 10  CREATININE 0.85  CALCIUM 7.8*    Recent Labs  04/26/13 0010  AST 139*  ALT 59*  ALKPHOS 240*  BILITOT 2.0*  PROT 7.0  ALBUMIN 1.8*   No results found for this basename: LIPASE, AMYLASE,  in the last 72 hours  Recent Labs  04/26/13 0010  WBC 3.6*  NEUTROABS 1.7  HGB 13.7  HCT 38.3*  MCV 109.4*  PLT 35*   No results found for this basename: CKTOTAL, CKMB, CKMBINDEX, TROPONINI,  in the last 72 hours No components found with this basename: POCBNP,  No results found for this basename:  DDIMER,  in the last 72 hours No results found for this basename: HGBA1C,  in the last 72 hours No results found for this basename: CHOL, HDL, LDLCALC, TRIG, CHOLHDL, LDLDIRECT,  in the last 72 hours No results found for this basename: TSH, T4TOTAL, FREET3, T3FREE, THYROIDAB,  in the last 72 hours No results found for this basename: VITAMINB12, FOLATE, FERRITIN, TIBC, IRON, RETICCTPCT,  in the last 72 hours Imaging results:  Ct Abdomen Pelvis Wo Contrast  04/26/2013   CLINICAL DATA:  Leg swelling for 3 months. Swelling in the groin area for 2 days.  EXAM: CT ABDOMEN AND PELVIS WITHOUT CONTRAST   TECHNIQUE: Multidetector CT imaging of the abdomen and pelvis was performed following the standard protocol without intravenous contrast.  COMPARISON:  None.  FINDINGS: Visualization of lung fields is limited due to respiratory motion artifact.  Visualization of solid organs and vascular structures is limited due noncontrast imaging. There are Coronary artery calcifications. The liver demonstrates changes of cirrhosis. Multiple stones in the gallbladder with gallbladder contraction. Free fluid in the upper abdomen around the liver, in the right and left lower quadrant, and extending down to the pelvis consistent with ascites. Spleen is mildly enlarged. An accessory spleen is present. Multiple varices are demonstrated throughout the upper abdomen and in the paraesophageal region. The unenhanced appearance of the pancreas, adrenal glands, kidneys, inferior vena cava, and retroperitoneal lymph nodes is unremarkable. Calcification of the abdominal aorta without aneurysm. The stomach, small bowel, and colon are not abnormally distended. No free air in the abdomen.  Pelvis: There is a right inguinal hernia with fluid extending into the right scrotum. Prostate gland is mildly enlarged. Bladder wall is mildly thickened, likely due to incomplete distention. No evidence of diverticulitis. The appendix is normal. Degenerative changes in the spine.  IMPRESSION: Changes of hepatic cirrhosis with splenic enlargement and diffuse abdominal and pelvic ascites. Multiple upper abdominal and paraesophageal varices. Right inguinal hernia containing ascitic fluid. Cholelithiasis with contracted gallbladder.   Electronically Signed   By: Burman Nieves M.D.   On: 04/26/2013 02:33    Assessment & Plan by Problem: Anasarca Unclear etiology.  May be related to patient's history of hepatitis C.  Request 2-D echocardiogram in the morning.  Also will get abdominal ultrasound to evaluate for cirrhosis and/or ascites.  Diuresis patient on  Lasix 40 mg IV every 8 hours.  Hepatitis C Ultrasound ordered as indicated above.  Send for hepatitis panel.  Patient will likely benefit from GI consultation, consider consulting GI in the morning.  Urinary tract infection? Patient denies any dysuria.  Likely may be related to scrotal swelling.  Start empiric ceftriaxone.  Follow urine culture.  De-escalation antibiotics if culture results are negative.  Hypertension Continue to monitor for now.  Thrombocytopenia Likely due to liver disease.  Continue to monitor.  Polysubstance abuse Counseled patient on smoking cessation.  Nicotine patch prescribed.  Also counseled the patient on alcohol cessation.  Started the patient on CIWA protocol, with thiamine and folic acid.  Prophylaxis SCDs.  No heparin given thrombocytopenia.  CODE STATUS Full code.  Disposition Admit the patient as inpatient to MedSurg.  Time spent on admission, talking to the patient, and coordinating care was: 50 mins.  Jaxsun Ciampi A, MD 04/26/2013, 6:16 AM

## 2013-04-26 NOTE — Consult Note (Signed)
Unassigned Patient  Reason for Consult: Decompensated cirrhosis Referring Physician: [facility]  Esmond Plants HPI: This is a 57 year old male with a PMH of HCV and ETOH abuse.  The noticed lower extremity swelling for the past couple of months, but then he started to have issues with abdominal and scrotal swelling.  No complaints of any pain or fever.  His last drink of ETOH was this past Sunday with the football game.  He has been drinking ETOH for the past 40 years and he is positive for cocaine in his urine tox screen.  Past Medical History  Diagnosis Date  . Hypertension   . Hepatitis C   . Alcohol abuse   . Ascites     History reviewed. No pertinent past surgical history.  Family History  Problem Relation Age of Onset  . Breast cancer Mother   . Diabetes Father     Social History:  reports that he has been smoking Cigarettes.  He has been smoking about 0.50 packs per day. He has never used smokeless tobacco. He reports that he drinks alcohol. He reports that he uses illicit drugs (Marijuana and Cocaine).  Allergies: No Known Allergies  Medications:  Scheduled: . cefTRIAXone (ROCEPHIN)  IV  1 g Intravenous Q24H  . folic acid  1 mg Oral Daily  . furosemide  40 mg Intravenous Q8H  . [START ON 04/27/2013] influenza vac split quadrivalent PF  0.5 mL Intramuscular Tomorrow-1000  . lactulose  20 g Oral Daily  . multivitamin with minerals  1 tablet Oral Daily  . nicotine  21 mg Transdermal Daily  . thiamine  100 mg Oral Daily   Or  . thiamine  100 mg Intravenous Daily   Continuous:   Results for orders placed during the hospital encounter of 04/25/13 (from the past 24 hour(s))  CBC WITH DIFFERENTIAL     Status: Abnormal   Collection Time    04/26/13 12:10 AM      Result Value Range   WBC 3.6 (*) 4.0 - 10.5 K/uL   RBC 3.50 (*) 4.22 - 5.81 MIL/uL   Hemoglobin 13.7  13.0 - 17.0 g/dL   HCT 16.1 (*) 09.6 - 04.5 %   MCV 109.4 (*) 78.0 - 100.0 fL   MCH 39.1 (*) 26.0  - 34.0 pg   MCHC 35.8  30.0 - 36.0 g/dL   RDW 40.9 (*) 81.1 - 91.4 %   Platelets 35 (*) 150 - 400 K/uL   Neutrophils Relative % 48  43 - 77 %   Neutro Abs 1.7  1.7 - 7.7 K/uL   Lymphocytes Relative 35  12 - 46 %   Lymphs Abs 1.3  0.7 - 4.0 K/uL   Monocytes Relative 14 (*) 3 - 12 %   Monocytes Absolute 0.5  0.1 - 1.0 K/uL   Eosinophils Relative 2  0 - 5 %   Eosinophils Absolute 0.1  0.0 - 0.7 K/uL   Basophils Relative 1  0 - 1 %   Basophils Absolute 0.0  0.0 - 0.1 K/uL  COMPREHENSIVE METABOLIC PANEL     Status: Abnormal   Collection Time    04/26/13 12:10 AM      Result Value Range   Sodium 140  137 - 147 mEq/L   Potassium 3.9  3.7 - 5.3 mEq/L   Chloride 105  96 - 112 mEq/L   CO2 26  19 - 32 mEq/L   Glucose, Bld 116 (*) 70 - 99  mg/dL   BUN 10  6 - 23 mg/dL   Creatinine, Ser 4.78  0.50 - 1.35 mg/dL   Calcium 7.8 (*) 8.4 - 10.5 mg/dL   Total Protein 7.0  6.0 - 8.3 g/dL   Albumin 1.8 (*) 3.5 - 5.2 g/dL   AST 295 (*) 0 - 37 U/L   ALT 59 (*) 0 - 53 U/L   Alkaline Phosphatase 240 (*) 39 - 117 U/L   Total Bilirubin 2.0 (*) 0.3 - 1.2 mg/dL   GFR calc non Af Amer >90  >90 mL/min   GFR calc Af Amer >90  >90 mL/min  AMMONIA     Status: Abnormal   Collection Time    04/26/13 12:10 AM      Result Value Range   Ammonia 79 (*) 11 - 60 umol/L  URINALYSIS, ROUTINE W REFLEX MICROSCOPIC     Status: Abnormal   Collection Time    04/26/13 12:37 AM      Result Value Range   Color, Urine ORANGE (*) YELLOW   APPearance CLOUDY (*) CLEAR   Specific Gravity, Urine 1.025  1.005 - 1.030   pH 6.0  5.0 - 8.0   Glucose, UA NEGATIVE  NEGATIVE mg/dL   Hgb urine dipstick MODERATE (*) NEGATIVE   Bilirubin Urine SMALL (*) NEGATIVE   Ketones, ur 15 (*) NEGATIVE mg/dL   Protein, ur NEGATIVE  NEGATIVE mg/dL   Urobilinogen, UA >6.2 (*) 0.0 - 1.0 mg/dL   Nitrite NEGATIVE  NEGATIVE   Leukocytes, UA MODERATE (*) NEGATIVE  URINE MICROSCOPIC-ADD ON     Status: None   Collection Time    04/26/13 12:37 AM       Result Value Range   Squamous Epithelial / LPF RARE  RARE   WBC, UA 11-20  <3 WBC/hpf   RBC / HPF 21-50  <3 RBC/hpf   Bacteria, UA RARE  RARE  ETHANOL     Status: None   Collection Time    04/26/13  3:41 AM      Result Value Range   Alcohol, Ethyl (B) <11  0 - 11 mg/dL  URINALYSIS, ROUTINE W REFLEX MICROSCOPIC     Status: Abnormal   Collection Time    04/26/13  5:00 AM      Result Value Range   Color, Urine YELLOW  YELLOW   APPearance CLOUDY (*) CLEAR   Specific Gravity, Urine 1.022  1.005 - 1.030   pH 7.0  5.0 - 8.0   Glucose, UA NEGATIVE  NEGATIVE mg/dL   Hgb urine dipstick MODERATE (*) NEGATIVE   Bilirubin Urine SMALL (*) NEGATIVE   Ketones, ur NEGATIVE  NEGATIVE mg/dL   Protein, ur NEGATIVE  NEGATIVE mg/dL   Urobilinogen, UA 4.0 (*) 0.0 - 1.0 mg/dL   Nitrite POSITIVE (*) NEGATIVE   Leukocytes, UA SMALL (*) NEGATIVE  URINE MICROSCOPIC-ADD ON     Status: Abnormal   Collection Time    04/26/13  5:00 AM      Result Value Range   Squamous Epithelial / LPF FEW (*) RARE   WBC, UA 3-6  <3 WBC/hpf   RBC / HPF 21-50  <3 RBC/hpf   Bacteria, UA RARE  RARE  URINE RAPID DRUG SCREEN (HOSP PERFORMED)     Status: Abnormal   Collection Time    04/26/13  5:00 AM      Result Value Range   Opiates NONE DETECTED  NONE DETECTED   Cocaine POSITIVE (*) NONE DETECTED  Benzodiazepines NONE DETECTED  NONE DETECTED   Amphetamines NONE DETECTED  NONE DETECTED   Tetrahydrocannabinol NONE DETECTED  NONE DETECTED   Barbiturates NONE DETECTED  NONE DETECTED  HEPATITIS PANEL, ACUTE     Status: Abnormal   Collection Time    04/26/13  6:35 AM      Result Value Range   Hepatitis B Surface Ag NEGATIVE  NEGATIVE   HCV Ab Reactive (*) NEGATIVE   Hep A IgM NON REACTIVE  NON REACTIVE   Hep B C IgM NON REACTIVE  NON REACTIVE  HIV ANTIBODY (ROUTINE TESTING)     Status: None   Collection Time    04/26/13  6:35 AM      Result Value Range   HIV NON REACTIVE  NON REACTIVE  POTASSIUM     Status: None    Collection Time    04/26/13  3:00 PM      Result Value Range   Potassium 4.4  3.7 - 5.3 mEq/L     Ct Abdomen Pelvis Wo Contrast  04/26/2013   CLINICAL DATA:  Leg swelling for 3 months. Swelling in the groin area for 2 days.  EXAM: CT ABDOMEN AND PELVIS WITHOUT CONTRAST  TECHNIQUE: Multidetector CT imaging of the abdomen and pelvis was performed following the standard protocol without intravenous contrast.  COMPARISON:  None.  FINDINGS: Visualization of lung fields is limited due to respiratory motion artifact.  Visualization of solid organs and vascular structures is limited due noncontrast imaging. There are Coronary artery calcifications. The liver demonstrates changes of cirrhosis. Multiple stones in the gallbladder with gallbladder contraction. Free fluid in the upper abdomen around the liver, in the right and left lower quadrant, and extending down to the pelvis consistent with ascites. Spleen is mildly enlarged. An accessory spleen is present. Multiple varices are demonstrated throughout the upper abdomen and in the paraesophageal region. The unenhanced appearance of the pancreas, adrenal glands, kidneys, inferior vena cava, and retroperitoneal lymph nodes is unremarkable. Calcification of the abdominal aorta without aneurysm. The stomach, small bowel, and colon are not abnormally distended. No free air in the abdomen.  Pelvis: There is a right inguinal hernia with fluid extending into the right scrotum. Prostate gland is mildly enlarged. Bladder wall is mildly thickened, likely due to incomplete distention. No evidence of diverticulitis. The appendix is normal. Degenerative changes in the spine.  IMPRESSION: Changes of hepatic cirrhosis with splenic enlargement and diffuse abdominal and pelvic ascites. Multiple upper abdominal and paraesophageal varices. Right inguinal hernia containing ascitic fluid. Cholelithiasis with contracted gallbladder.   Electronically Signed   By: Burman Nieves M.D.    On: 04/26/2013 02:33   US Abdomen Complete  04/26/2013   CLINICAL DATA:  Anasarca.  Assess for cirrhosis.  EXAM: ULTRASOUND ABDOMEN COMPLETE  COMPARISON:  CT 04/26/2013  FINDINGS: Gallbladder:  The gallbladder is contracted and contains multiple stones. Wall thickness measures 5 mm. No Murphy sign.  Common bile duct:  Diameter: Normal at 5.7 mm.  Liver:  Findings consistent with cirrhosis. Lobular margins with prominent left lobe. Slow flow in the portal vein which remains hepatopetal. No evidence of focal mass lesion.  IVC:  No abnormality visualized.  Pancreas:  Poorly seen because of overlying bowel gas  Spleen:  Upper limits of normal with a length of 9 cm.  Right Kidney:  Length: 14.2 cm in length. No focal lesion. No hydronephrosis. Echogenicity within normal limits.  Left Kidney:  Length: 13.9 cm in length. No focal lesions or  hydronephrosis. Echogenicity within normal limits.  Abdominal aorta:  Atherosclerosis without aneurysm.  Other findings:  Prominent diffuse ascites.  IMPRESSION: Findings consistent with cirrhosis of the liver and diffuse ascites. Slow flow in the portal vein which remains hepatopetal at the moment.  Multiple stones in a contracted gallbladder. Slight gallbladder wall thickening, frequently seen in the setting of ascites.   Electronically Signed   By: Paulina Fusi M.D.   On: 04/26/2013 08:34    ROS:  As stated above in the HPI otherwise negative.  Blood pressure 154/88, pulse 55, temperature 98.2 F (36.8 C), temperature source Oral, resp. rate 18, height 5\' 6"  (1.676 m), weight 241 lb (109.317 kg), SpO2 98.00%.    PE: Gen: NAD, Alert and Oriented HEENT:  [place]/AT, EOMI Neck: Supple, no LAD Lungs: CTA Bilaterally CV: RRR without M/G/R ABM: Soft, distended, soft, +BS Ext: 2+ LE edema  Assessment/Plan: 1) Decompensated cirrhosis. 2) ETOH abuse.   The patient has a poor prognosis.  50% of patients who present with ascites will not survive beyond two years.  I doubt  that he will be able to remain abstinent.  I agree with the diuretics, however, TID is a bit frequent.  An important point is to remain on a low sodium diet.  Plan: 1) Step I diuretics - Lasix 40 mg QD and spironolactone 100 mg QD when some of the swelling has decreased.  Okay to keep on TID for the next 1-2 days to help improve his symptoms. 2) Less 2000 mg sodium diet.  Deannie Resetar D 04/26/2013, 5:07 PM

## 2013-04-26 NOTE — ED Notes (Signed)
Patient given a urinal.

## 2013-04-27 DIAGNOSIS — I359 Nonrheumatic aortic valve disorder, unspecified: Secondary | ICD-10-CM

## 2013-04-27 LAB — COMPREHENSIVE METABOLIC PANEL
ALT: 57 U/L — ABNORMAL HIGH (ref 0–53)
AST: 136 U/L — ABNORMAL HIGH (ref 0–37)
CO2: 25 mEq/L (ref 19–32)
Calcium: 7.9 mg/dL — ABNORMAL LOW (ref 8.4–10.5)
Chloride: 105 mEq/L (ref 96–112)
GFR calc non Af Amer: >90 mL/min (ref >90)
Sodium: 140 mEq/L (ref 137–147)

## 2013-04-27 LAB — URINE CULTURE

## 2013-04-27 LAB — CBC
HCT: 38.7 % — ABNORMAL LOW (ref 39.0–52.0)
MCH: 38.1 pg — ABNORMAL HIGH (ref 26.0–34.0)
Platelets: 37 10*3/uL — ABNORMAL LOW (ref 150–400)
RBC: 3.52 MIL/uL — ABNORMAL LOW (ref 4.22–5.81)
WBC: 3.7 10*3/uL — ABNORMAL LOW (ref 4.0–10.5)

## 2013-04-27 MED ORDER — FUROSEMIDE 40 MG PO TABS
40.0000 mg | ORAL_TABLET | Freq: Two times a day (BID) | ORAL | Status: DC
  Administered 2013-04-27 – 2013-04-28 (×2): 40 mg via ORAL
  Filled 2013-04-27 (×4): qty 1

## 2013-04-27 MED ORDER — CIPROFLOXACIN HCL 500 MG PO TABS
500.0000 mg | ORAL_TABLET | Freq: Two times a day (BID) | ORAL | Status: DC
  Administered 2013-04-27 – 2013-04-29 (×4): 500 mg via ORAL
  Filled 2013-04-27 (×6): qty 1

## 2013-04-27 MED ORDER — SPIRONOLACTONE 100 MG PO TABS
100.0000 mg | ORAL_TABLET | Freq: Every day | ORAL | Status: DC
  Administered 2013-04-27 – 2013-04-29 (×3): 100 mg via ORAL
  Filled 2013-04-27 (×3): qty 1

## 2013-04-27 NOTE — Progress Notes (Signed)
Echo Lab  2D Echocardiogram completed.  Jose [name], RDCS 04/27/2013 9:25 AM

## 2013-04-27 NOTE — Progress Notes (Signed)
Subjective: Feeling better.  Belly is not as tight.  Objective: Vital signs in last 24 hours: Temp:  [97.1 F (36.2 C)-98.4 F (36.9 C)] 97.1 F (36.2 C) (12/31 0530) Pulse Rate:  [52-64] 64 (12/31 0530) Resp:  [17-18] 18 (12/31 0530) BP: (146-169)/(79-100) 146/87 mmHg (12/31 0530) SpO2:  [94 %-100 %] 100 % (12/31 0530) Last BM Date: 04/26/13  Intake/Output from previous day: 12/30 0701 - 12/31 0700 In: 220 [P.O.:120; IV Piggyback:100] Out: 2250 [Urine:2250] Intake/Output this shift: Total I/O In: -  Out: 1000 [Urine:1000]  General appearance: alert and no distress GI: soft, non-tender; bowel sounds normal; no masses,  no organomegaly  Lab Results:  Recent Labs  04/26/13 0010 04/27/13 0710  WBC 3.6* 3.7*  HGB 13.7 13.4  HCT 38.3* 38.7*  PLT 35* 37*   BMET  Recent Labs  04/26/13 0010 04/26/13 1500 04/27/13 0710  NA 140  --  140  K 3.9 4.4 4.0  CL 105  --  105  CO2 26  --  25  GLUCOSE 116*  --  96  BUN 10  --  10  CREATININE 0.85  --  0.90  CALCIUM 7.8*  --  7.9*   LFT  Recent Labs  04/27/13 0710  PROT 6.8  ALBUMIN 1.7*  AST 136*  ALT 57*  ALKPHOS 190*  BILITOT 2.0*   PT/INR No results found for this basename: LABPROT, INR,  in the last 72 hours Hepatitis Panel  Recent Labs  04/26/13 0635  HEPBSAG NEGATIVE  HCVAB Reactive*  HEPAIGM NON REACTIVE  HEPBIGM NON REACTIVE   C-Diff No results found for this basename: CDIFFTOX,  in the last 72 hours Fecal Lactopherrin No results found for this basename: FECLLACTOFRN,  in the last 72 hours  Studies/Results: Ct Abdomen Pelvis Wo Contrast  04/26/2013   CLINICAL DATA:  Leg swelling for 3 months. Swelling in the groin area for 2 days.  EXAM: CT ABDOMEN AND PELVIS WITHOUT CONTRAST  TECHNIQUE: Multidetector CT imaging of the abdomen and pelvis was performed following the standard protocol without intravenous contrast.  COMPARISON:  None.  FINDINGS: Visualization of lung fields is limited due to  respiratory motion artifact.  Visualization of solid organs and vascular structures is limited due noncontrast imaging. There are Coronary artery calcifications. The liver demonstrates changes of cirrhosis. Multiple stones in the gallbladder with gallbladder contraction. Free fluid in the upper abdomen around the liver, in the right and left lower quadrant, and extending down to the pelvis consistent with ascites. Spleen is mildly enlarged. An accessory spleen is present. Multiple varices are demonstrated throughout the upper abdomen and in the paraesophageal region. The unenhanced appearance of the pancreas, adrenal glands, kidneys, inferior vena cava, and retroperitoneal lymph nodes is unremarkable. Calcification of the abdominal aorta without aneurysm. The stomach, small bowel, and colon are not abnormally distended. No free air in the abdomen.  Pelvis: There is a right inguinal hernia with fluid extending into the right scrotum. Prostate gland is mildly enlarged. Bladder wall is mildly thickened, likely due to incomplete distention. No evidence of diverticulitis. The appendix is normal. Degenerative changes in the spine.  IMPRESSION: Changes of hepatic cirrhosis with splenic enlargement and diffuse abdominal and pelvic ascites. Multiple upper abdominal and paraesophageal varices. Right inguinal hernia containing ascitic fluid. Cholelithiasis with contracted gallbladder.   Electronically Signed   By: Burman Nieves M.D.   On: 04/26/2013 02:33   US Abdomen Complete  04/26/2013   CLINICAL DATA:  Anasarca.  Assess for  cirrhosis.  EXAM: ULTRASOUND ABDOMEN COMPLETE  COMPARISON:  CT 04/26/2013  FINDINGS: Gallbladder:  The gallbladder is contracted and contains multiple stones. Wall thickness measures 5 mm. No Murphy sign.  Common bile duct:  Diameter: Normal at 5.7 mm.  Liver:  Findings consistent with cirrhosis. Lobular margins with prominent left lobe. Slow flow in the portal vein which remains hepatopetal. No  evidence of focal mass lesion.  IVC:  No abnormality visualized.  Pancreas:  Poorly seen because of overlying bowel gas  Spleen:  Upper limits of normal with a length of 9 cm.  Right Kidney:  Length: 14.2 cm in length. No focal lesion. No hydronephrosis. Echogenicity within normal limits.  Left Kidney:  Length: 13.9 cm in length. No focal lesions or hydronephrosis. Echogenicity within normal limits.  Abdominal aorta:  Atherosclerosis without aneurysm.  Other findings:  Prominent diffuse ascites.  IMPRESSION: Findings consistent with cirrhosis of the liver and diffuse ascites. Slow flow in the portal vein which remains hepatopetal at the moment.  Multiple stones in a contracted gallbladder. Slight gallbladder wall thickening, frequently seen in the setting of ascites.   Electronically Signed   By: Paulina Fusi M.D.   On: 04/26/2013 08:34    Medications:  Scheduled: . cefTRIAXone (ROCEPHIN)  IV  1 g Intravenous Q24H  . folic acid  1 mg Oral Daily  . furosemide  40 mg Intravenous Q8H  . influenza vac split quadrivalent PF  0.5 mL Intramuscular Tomorrow-1000  . lactulose  20 g Oral Daily  . multivitamin with minerals  1 tablet Oral Daily  . nicotine  21 mg Transdermal Daily  . thiamine  100 mg Oral Daily   Continuous:   Assessment/Plan: 1) Decompensated cirrhosis. 2) ETOH abuse. 3) Cocaine abuse.   His edema has improved.  He is feeling better. His abdomen is softer and her lower extremity edema has improved significantly.  Plan: 1) Continue with lasix and spironolactone. 2) Low salt diet. 3) Stop ETOH and cocaine abuse. 4) Signing off.    LOS: 2 days   Roosvelt Churchwell D 04/27/2013, 8:19 AM

## 2013-04-27 NOTE — Progress Notes (Addendum)
INITIAL NUTRITION ASSESSMENT  DOCUMENTATION CODES Per approved criteria  -Obesity Unspecified   INTERVENTION: 1.  General healthful diet; encourage intake of foods and beverages as able.  RD to follow and assess for nutritional adequacy.  2.  Brief education; provided to patient re: Low sodium diet. Discouraged intake of processed foods and use of salt shaker. RD discussed why it is important for patient to adhere to diet recommendations, and emphasized the role of fluids.  NUTRITION DIAGNOSIS: Nutrition-related knowledge deficit related to cirrhosis with ascites as evidenced by new to low sodium diet.   Monitor:  1.  Food/Beverage; pt meeting >/=90% estimated needs with tolerance. 2.  Wt/wt change; monitor trends 3. Knowledge; monitor for questions  Reason for Assessment: consult for assessment  57 y.o. male  Admitting Dx: Swelling  ASSESSMENT: Pt admitted with swelling in lower extremities, abdomen, and scrotum. Pt with EtOH and cocaine abuse.  RD met with pt who denies nutrition-related concerns.  Pt states he cooks for himself, as well as has meals prepared by his god mother. He does occasionally eat out.  Pt states that he eats a regular diet at home. A low sodium diet is discussed with pt.  He is able to states ways he can decrease his salt intake. Currently eating 100% of meals. Nutrition focused physical exam limited by edema/ascites.   Height: Ht Readings from Last 1 Encounters:  04/25/13 5\' 6"  (1.676 m)    Weight: Wt Readings from Last 1 Encounters:  04/25/13 241 lb (109.317 kg)    Ideal Body Weight: 64.5 kg  % Ideal Body Weight: 168%  Wt Readings from Last 10 Encounters:  04/25/13 241 lb (109.317 kg)    Usual Body Weight: unknown, pt unable to state  % Usual Body Weight: unknown  BMI:  Body mass index is 38.92 kg/(m^2).  Estimated Nutritional Needs: Kcal: 1800-2000 Protein: 65-85g Fluid: ~1.8 L/day  Skin: intact  Diet Order: Sodium  Restricted  EDUCATION NEEDS: -Education needs addressed   Intake/Output Summary (Last 24 hours) at 04/27/13 1307 Last data filed at 04/27/13 1028  Gross per 24 hour  Intake    220 ml  Output   3500 ml  Net  -3280 ml    Last BM: 12/30  Labs:   Recent Labs Lab 04/26/13 0010 04/26/13 1500 04/27/13 0710  NA 140  --  140  K 3.9 4.4 4.0  CL 105  --  105  CO2 26  --  25  BUN 10  --  10  CREATININE 0.85  --  0.90  CALCIUM 7.8*  --  7.9*  GLUCOSE 116*  --  96    CBG (last 3)  No results found for this basename: GLUCAP,  in the last 72 hours  Scheduled Meds: . cefTRIAXone (ROCEPHIN)  IV  1 g Intravenous Q24H  . folic acid  1 mg Oral Daily  . furosemide  40 mg Intravenous Q8H  . lactulose  20 g Oral Daily  . multivitamin with minerals  1 tablet Oral Daily  . nicotine  21 mg Transdermal Daily  . thiamine  100 mg Oral Daily    Continuous Infusions:   Past Medical History  Diagnosis Date  . Hypertension   . Hepatitis C   . Alcohol abuse   . Ascites     History reviewed. No pertinent past surgical history.  Loyce Dys, MS RD LDN Clinical Inpatient Dietitian Pager: [phone] Weekend/After hours pager: [phone]

## 2013-04-27 NOTE — Care Management Note (Signed)
  Page 1 of 1   04/29/2013     2:29:48 PM   CARE MANAGEMENT NOTE 04/29/2013  Patient:  ANTWONE, CAPOZZOLI   Account Number:  [id number]  Date Initiated:  04/27/2013  Documentation initiated by:  Ronny Flurry  Subjective/Objective Assessment:     Action/Plan:   Anticipated DC Date:  04/29/2013   Anticipated DC Plan:  HOME/SELF CARE      DC Planning Services  [facility]  [facility]      Choice offered to / List presented to:             Status of service:   Medicare Important Message given?   (If response is "NO", the following Medicare IM given date fields will be blank) Date Medicare IM given:   Date Additional Medicare IM given:    Discharge Disposition:    Per UR Regulation:    If discussed at Long Length of Stay Meetings, dates discussed:    Comments:  04-29-13 [facility] letter given and explained. Patient voiced understanding . Patient called [facility] and left voice mail to schedule appointment. Ronny Flurry RN BSN [phone]   04-27-13 [place] has information on [employer] , [employer] and [employer] . Patient stated he will call today to schedule follow up appointment .  Ronny Flurry RN BSN [phone]

## 2013-04-27 NOTE — Progress Notes (Signed)
[facility] PROGRESS NOTE  Jose [name] QVZ:[id number] DOB: 07-03-1955 DOA: 04/25/2013 PCP: No PCP Per Patient  Jose [name] is a 57 y.o. African American male with history of hepatitis C and hypertension not on any medications who presents with the above complaints. Patient reports that he has had increased swelling in the last 2-3 months, however in the last 3-4 days he is noted increased swelling in lower extremities, abdomen, and his scrotum.  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 Change to po lasix. Likely discharge in am  UTI Grew lactobacillus. Change to po abx  Alcohol abuse with alcoholic coagulopathy No withdrawal. counselled to quit  Cocaine positive Will consult social work for resources.  Hep C Cirrhosis   Hypertension   Right leg swelling significantly greater than left Doppler negative  Rash Area of black induration on right lower extremity. Skin care per floor RN.  DVT Prophylaxis: .  Platelets 35.  Code Status: full Family Communication: patient alert and orientated. Disposition Plan: to friends home when appropriate.   Consultants:  Dr. Elnoria Howard, GI  Procedures:    Antibiotics:  Rocephin  HPI/Subjective: Swelling started 3 days ago, made it difficult to walk.  Breathing fine.  Objective: Filed Vitals:   04/26/13 1636 04/26/13 2119 04/27/13 0530 04/27/13 1200  BP: 154/88 169/82 146/87 142/66  Pulse: 55 54 64 62  Temp: 98.2 F (36.8 C) 98.4 F (36.9 C) 97.1 F (36.2 C)   TempSrc: Oral Oral Oral   Resp: 18 18 18    Height:      Weight:      SpO2: 98% 100% 100%     Intake/Output Summary (Last 24 hours) at 04/27/13 1727 Last data filed at 04/27/13 1448  Gross per 24 hour  Intake    460 ml  Output   3050 ml  Net  -2590 ml   Filed Weights   04/25/13 1836  Weight: 109.317 kg (241 lb)    Exam: General: Well developed, well nourished, NAD, appears older than stated age  HEENT:  PERR, EOMI, 2+  icteic Sclera, MMM. No pharyngeal erythema or exudates  Neck: Supple, no JVD, no masses  Cardiovascular: RRR, S1 S2, +systolic murmur Respiratory: Clear to auscultation bilaterally with equal chest rise  Abdomen: Soft, nontender, protuberant, + bowel sounds  GU: scrotal edema present Extremities: warm dry without cyanosis clubbing or edema.  Neuro: AAOx3, cranial nerves grossly intact. Strength 5/5 in upper and lower extremities  Skin: dry skin thru out, patch of black induration on right lower ext. Psych: Normal affect and demeanor with intact judgement and insight       Data Reviewed: Basic Metabolic Panel:  Recent Labs Lab 04/26/13 0010 04/26/13 1500 04/27/13 0710  NA 140  --  140  K 3.9 4.4 4.0  CL 105  --  105  CO2 26  --  25  GLUCOSE 116*  --  96  BUN 10  --  10  CREATININE 0.85  --  0.90  CALCIUM 7.8*  --  7.9*   Liver Function Tests:  Recent Labs Lab 04/26/13 0010 04/27/13 0710  AST 139* 136*  ALT 59* 57*  ALKPHOS 240* 190*  BILITOT 2.0* 2.0*  PROT 7.0 6.8  ALBUMIN 1.8* 1.7*    Recent Labs Lab 04/26/13 0010  AMMONIA 79*   CBC:  Recent Labs Lab 04/26/13 0010 04/27/13 0710  WBC 3.6* 3.7*  NEUTROABS 1.7  --   HGB 13.7 13.4  HCT 38.3* 38.7*  MCV  109.4* 109.9*  PLT 35* 37*     Studies: Ct Abdomen Pelvis Wo Contrast  04/26/2013   CLINICAL DATA:  Leg swelling for 3 months. Swelling in the groin area for 2 days.  EXAM: CT ABDOMEN AND PELVIS WITHOUT CONTRAST  TECHNIQUE: Multidetector CT imaging of the abdomen and pelvis was performed following the standard protocol without intravenous contrast.  COMPARISON:  None.  FINDINGS: Visualization of lung fields is limited due to respiratory motion artifact.  Visualization of solid organs and vascular structures is limited due noncontrast imaging. There are Coronary artery calcifications. The liver demonstrates changes of cirrhosis. Multiple stones in the gallbladder with gallbladder contraction. Free fluid  in the upper abdomen around the liver, in the right and left lower quadrant, and extending down to the pelvis consistent with ascites. Spleen is mildly enlarged. An accessory spleen is present. Multiple varices are demonstrated throughout the upper abdomen and in the paraesophageal region. The unenhanced appearance of the pancreas, adrenal glands, kidneys, inferior vena cava, and retroperitoneal lymph nodes is unremarkable. Calcification of the abdominal aorta without aneurysm. The stomach, small bowel, and colon are not abnormally distended. No free air in the abdomen.  Pelvis: There is a right inguinal hernia with fluid extending into the right scrotum. Prostate gland is mildly enlarged. Bladder wall is mildly thickened, likely due to incomplete distention. No evidence of diverticulitis. The appendix is normal. Degenerative changes in the spine.  IMPRESSION: Changes of hepatic cirrhosis with splenic enlargement and diffuse abdominal and pelvic ascites. Multiple upper abdominal and paraesophageal varices. Right inguinal hernia containing ascitic fluid. Cholelithiasis with contracted gallbladder.   Electronically Signed   By: Burman Nieves M.D.   On: 04/26/2013 02:33   US Abdomen Complete  04/26/2013   CLINICAL DATA:  Anasarca.  Assess for cirrhosis.  EXAM: ULTRASOUND ABDOMEN COMPLETE  COMPARISON:  CT 04/26/2013  FINDINGS: Gallbladder:  The gallbladder is contracted and contains multiple stones. Wall thickness measures 5 mm. No Murphy sign.  Common bile duct:  Diameter: Normal at 5.7 mm.  Liver:  Findings consistent with cirrhosis. Lobular margins with prominent left lobe. Slow flow in the portal vein which remains hepatopetal. No evidence of focal mass lesion.  IVC:  No abnormality visualized.  Pancreas:  Poorly seen because of overlying bowel gas  Spleen:  Upper limits of normal with a length of 9 cm.  Right Kidney:  Length: 14.2 cm in length. No focal lesion. No hydronephrosis. Echogenicity within normal  limits.  Left Kidney:  Length: 13.9 cm in length. No focal lesions or hydronephrosis. Echogenicity within normal limits.  Abdominal aorta:  Atherosclerosis without aneurysm.  Other findings:  Prominent diffuse ascites.  IMPRESSION: Findings consistent with cirrhosis of the liver and diffuse ascites. Slow flow in the portal vein which remains hepatopetal at the moment.  Multiple stones in a contracted gallbladder. Slight gallbladder wall thickening, frequently seen in the setting of ascites.   Electronically Signed   By: Paulina Fusi M.D.   On: 04/26/2013 08:34    Scheduled Meds: . cefTRIAXone (ROCEPHIN)  IV  1 g Intravenous Q24H  . folic acid  1 mg Oral Daily  . furosemide  40 mg Intravenous Q8H  . lactulose  20 g Oral Daily  . multivitamin with minerals  1 tablet Oral Daily  . nicotine  21 mg Transdermal Daily  . thiamine  100 mg Oral Daily   Continuous Infusions:    Active Problems:   Anasarca   UTI (urinary tract infection)  Hypertension   Hepatitis C    Sidda Humm L,   [facility] Pager [phone]. If 7PM-7AM, please contact night-coverage at [email or web address], password [facility] 04/27/2013, 5:27 PM  LOS: 2 days

## 2013-04-28 DIAGNOSIS — D696 Thrombocytopenia, unspecified: Secondary | ICD-10-CM | POA: Diagnosis present

## 2013-04-28 DIAGNOSIS — L209 Atopic dermatitis, unspecified: Secondary | ICD-10-CM | POA: Diagnosis present

## 2013-04-28 DIAGNOSIS — L2089 Other atopic dermatitis: Secondary | ICD-10-CM

## 2013-04-28 LAB — BASIC METABOLIC PANEL
BUN: 10 mg/dL (ref 6–23)
CHLORIDE: 101 meq/L (ref 96–112)
CO2: 26 mEq/L (ref 19–32)
CREATININE: 0.85 mg/dL (ref 0.50–1.35)
Calcium: 7.4 mg/dL — ABNORMAL LOW (ref 8.4–10.5)
GFR calc non Af Amer: >90 mL/min (ref >90)
Glucose, Bld: 104 mg/dL — ABNORMAL HIGH (ref 70–99)
POTASSIUM: 3.8 meq/L (ref 3.7–5.3)
SODIUM: 134 meq/L — AB (ref 137–147)

## 2013-04-28 MED ORDER — FUROSEMIDE 40 MG PO TABS
60.0000 mg | ORAL_TABLET | Freq: Two times a day (BID) | ORAL | Status: DC
  Administered 2013-04-28 – 2013-04-29 (×2): 60 mg via ORAL
  Filled 2013-04-28 (×6): qty 1

## 2013-04-28 NOTE — Progress Notes (Signed)
[facility] PROGRESS NOTE  Jose [name] [id number] DOB: August 04, 1955 DOA: 04/25/2013 PCP: No PCP Per Patient  Jose [name] is a 58 y.o. African American male with history of hepatitis C and hypertension not on any medications who presents with the above complaints. Patient reports that he has had increased swelling in the last 2-3 months, however in the last 3-4 days he is noted increased swelling in lower extremities, abdomen, and his scrotum.  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 Improving, but still with significant scrotal edema. Diurese in house for one more day. Likely home in am  UTI Grew lactobacillus. Change to po abx  Alcohol abuse with alcoholic coagulopathy No withdrawal. counselled to quit  Cocaine positive Will consult social work for resources.  Hep C Cirrhosis   Hypertension   Right leg swelling significantly greater than left Doppler negative  Rash Atopic dermatitis  DVT Prophylaxis: .  Platelets 35.  Code Status: full Family Communication: patient alert and orientated. Disposition Plan: to friends home when appropriate.   Consultants:  Dr. Benson Norway, GI  Procedures:    Antibiotics:  Rocephin  HPI/Subjective: Still with scrotal edema  Objective: Filed Vitals:   04/27/13 2237 04/28/13 0500 04/28/13 0504 04/28/13 1401  BP: 113/60  119/59 123/74  Pulse: 69  68 66  Temp: 98 F (36.7 C)  97.5 F (36.4 C) 98.2 F (36.8 C)  TempSrc: Oral  Oral Oral  Resp: 16  17 15   Height:      Weight:  99 kg (218 lb 4.1 oz)    SpO2: 97%  100% 95%    Intake/Output Summary (Last 24 hours) at 04/28/13 1533 Last data filed at 04/28/13 0617  Gross per 24 hour  Intake      0 ml  Output    800 ml  Net   -800 ml   Filed Weights   04/25/13 1836 04/27/13 1700 04/28/13 0500  Weight: 109.317 kg (241 lb) 101 kg (222 lb 10.6 oz) 99 kg (218 lb 4.1 oz)    Exam: General: Well developed, well nourished, NAD, appears older  than stated age  HEENT:  PERR, EOMI, 2+ icteic Sclera, MMM. No pharyngeal erythema or exudates  Neck: Supple, no JVD, no masses  Cardiovascular: RRR, S1 S2, +systolic murmur Respiratory: Clear to auscultation bilaterally with equal chest rise  Abdomen: Soft, nontender, protuberant, + bowel sounds  GU: scrotal edema present Extremities: warm dry without cyanosis clubbing or edema.   Skin: rash on ankle with hyperpigmentation and lichenification        Data Reviewed: Basic Metabolic Panel:  Recent Labs Lab 04/26/13 0010 04/26/13 1500 04/27/13 0710 04/28/13 0815  NA 140  --  140 134*  K 3.9 4.4 4.0 3.8  CL 105  --  105 101  CO2 26  --  25 26  GLUCOSE 116*  --  96 104*  BUN 10  --  10 10  CREATININE 0.85  --  0.90 0.85  CALCIUM 7.8*  --  7.9* 7.4*   Liver Function Tests:  Recent Labs Lab 04/26/13 0010 04/27/13 0710  AST 139* 136*  ALT 59* 57*  ALKPHOS 240* 190*  BILITOT 2.0* 2.0*  PROT 7.0 6.8  ALBUMIN 1.8* 1.7*    Recent Labs Lab 04/26/13 0010  AMMONIA 79*   CBC:  Recent Labs Lab 04/26/13 0010 04/27/13 0710  WBC 3.6* 3.7*  NEUTROABS 1.7  --   HGB 13.7 13.4  HCT 38.3* 38.7*  MCV 109.4* 109.9*  PLT 35* 37*     Studies: No results found.  Scheduled Meds: . ciprofloxacin  500 mg Oral BID  . folic acid  1 mg Oral Daily  . furosemide  40 mg Oral BID  . lactulose  20 g Oral Daily  . multivitamin with minerals  1 tablet Oral Daily  . nicotine  21 mg Transdermal Daily  . spironolactone  100 mg Oral Daily  . thiamine  100 mg Oral Daily   Continuous Infusions:    Active Problems:   Anasarca   UTI (urinary tract infection)   Hypertension   Hepatitis C   Jose [name] L,   [facility] Pager [phone]. If 7PM-7AM, please contact night-coverage at [email or web address], password [facility] 04/28/2013, 3:33 PM  LOS: 3 days

## 2013-04-29 LAB — CBC
HCT: 35.9 % — ABNORMAL LOW (ref 39.0–52.0)
HEMOGLOBIN: 12.4 g/dL — AB (ref 13.0–17.0)
MCH: 37.7 pg — ABNORMAL HIGH (ref 26.0–34.0)
MCHC: 34.5 g/dL (ref 30.0–36.0)
MCV: 109.1 fL — AB (ref 78.0–100.0)
PLATELETS: 33 10*3/uL — AB (ref 150–400)
RBC: 3.29 MIL/uL — AB (ref 4.22–5.81)
RDW: 15.5 % (ref 11.5–15.5)
WBC: 3 10*3/uL — AB (ref 4.0–10.5)

## 2013-04-29 LAB — BASIC METABOLIC PANEL
BUN: 10 mg/dL (ref 6–23)
CALCIUM: 7.4 mg/dL — AB (ref 8.4–10.5)
CO2: 25 meq/L (ref 19–32)
Chloride: 100 mEq/L (ref 96–112)
Creatinine, Ser: 0.91 mg/dL (ref 0.50–1.35)
GFR calc Af Amer: >90 mL/min (ref >90)
GFR calc non Af Amer: >90 mL/min (ref >90)
Glucose, Bld: 131 mg/dL — ABNORMAL HIGH (ref 70–99)
POTASSIUM: 3.6 meq/L — AB (ref 3.7–5.3)
SODIUM: 133 meq/L — AB (ref 137–147)

## 2013-04-29 MED ORDER — SPIRONOLACTONE 100 MG PO TABS: 100.0000 mg | ORAL_TABLET | Freq: Every day | ORAL | Status: DC

## 2013-04-29 MED ORDER — FUROSEMIDE 40 MG PO TABS: 60.0000 mg | ORAL_TABLET | Freq: Two times a day (BID) | ORAL | Status: DC

## 2013-04-29 NOTE — Progress Notes (Signed)
Discharge home. Home discharge instruction given, no question verbalized. 

## 2013-04-29 NOTE — Discharge Summary (Signed)
Physician Discharge Summary  Jose [name] [id number] DOB: 09-20-1955 DOA: 04/25/2013  PCP: No PCP Per Patient  Admit date: 04/25/2013 Discharge date: 04/29/2013  Time spent: greater than 30 min  Recommendations for Outpatient Follow-up:  1. Monitor weights, BMET  Discharge Diagnoses:  Active Problems:   Anasarca   UTI (urinary tract infection)   Hypertension   Hepatitis C   Atopic dermatitis   Thrombocytopenia, unspecified   Discharge Condition:   Filed Weights   04/27/13 1700 04/28/13 0500 04/29/13 0620  Weight: 101 kg (222 lb 10.6 oz) 99 kg (218 lb 4.1 oz) 98.612 kg (217 lb 6.4 oz)    History of present illness:  58 y.o. African American male with history of hepatitis C and hypertension not on any medications who presents with the above complaints. Patient reports that he has had increased swelling in the last 2-3 months, however in the last 3-4 days he is noted increased swelling in lower extremities, abdomen, and his scrotum. He initially presented to urgent care, but was sent to the emergency department for further evaluation. Hospitalist service was asked to admit the patient for generalized edema. He denies any recent fevers, chills, nausea, vomiting, chest pain, abdominal pain, diarrhea, headaches or vision changes. Does complain of minimal shortness of breath  Hospital Course:  Started on IV lasix, CIWA protocol.  GI consulted.  Consulted social work for alcohol and drugs.  Diuresed well, but still some residual scrotal edema at discharge.  No alcohol withdrawal.  Encouraged to abstain from alcohol and f/u as outpatient.  Procedures:  none  Consultations:  [facility]  Discharge Exam: Filed Vitals:   04/29/13 0614  BP: 132/82  Pulse: 66  Temp: 98.4 F (36.9 C)  Resp: 20    General: comfortable Cardiovascular: RRR Respiratory: CTA Abd: s, nt, nd Ext no edema GU: scrotal edema present, but less so.  Discharge Instructions  Discharge Orders   Future  Orders Complete By Expires   Diet - low sodium heart healthy  As directed        Medication List    STOP taking these medications       ALEVE PO      TAKE these medications       furosemide 40 MG tablet  Commonly known as:  LASIX  Take 1.5 tablets (60 mg total) by mouth 2 (two) times daily.     spironolactone 100 MG tablet  Commonly known as:  ALDACTONE  Take 1 tablet (100 mg total) by mouth daily.       No Known Allergies     Follow-up Information   Follow up with [facility]     In 1 week.   Contact information:   [place] [id number] [phone]       The results of significant diagnostics from this hospitalization (including imaging, microbiology, ancillary and laboratory) are listed below for reference.    Significant Diagnostic Studies: Ct Abdomen Pelvis Wo Contrast  04/26/2013   CLINICAL DATA:  Leg swelling for 3 months. Swelling in the groin area for 2 days.  EXAM: CT ABDOMEN AND PELVIS WITHOUT CONTRAST  TECHNIQUE: Multidetector CT imaging of the abdomen and pelvis was performed following the standard protocol without intravenous contrast.  COMPARISON:  None.  FINDINGS: Visualization of lung fields is limited due to respiratory motion artifact.  Visualization of solid organs and vascular structures is limited due noncontrast imaging. There are Coronary artery calcifications. The liver demonstrates changes  of cirrhosis. Multiple stones in the gallbladder with gallbladder contraction. Free fluid in the upper abdomen around the liver, in the right and left lower quadrant, and extending down to the pelvis consistent with ascites. Spleen is mildly enlarged. An accessory spleen is present. Multiple varices are demonstrated throughout the upper abdomen and in the paraesophageal region. The unenhanced appearance of the pancreas, adrenal glands, kidneys, inferior vena cava, and retroperitoneal lymph nodes is unremarkable.  Calcification of the abdominal aorta without aneurysm. The stomach, small bowel, and colon are not abnormally distended. No free air in the abdomen.  Pelvis: There is a right inguinal hernia with fluid extending into the right scrotum. Prostate gland is mildly enlarged. Bladder wall is mildly thickened, likely due to incomplete distention. No evidence of diverticulitis. The appendix is normal. Degenerative changes in the spine.  IMPRESSION: Changes of hepatic cirrhosis with splenic enlargement and diffuse abdominal and pelvic ascites. Multiple upper abdominal and paraesophageal varices. Right inguinal hernia containing ascitic fluid. Cholelithiasis with contracted gallbladder.   Electronically Signed   By: Lucienne Capers M.D.   On: 04/26/2013 02:33   US Abdomen Complete  04/26/2013   CLINICAL DATA:  Anasarca.  Assess for cirrhosis.  EXAM: ULTRASOUND ABDOMEN COMPLETE  COMPARISON:  CT 04/26/2013  FINDINGS: Gallbladder:  The gallbladder is contracted and contains multiple stones. Wall thickness measures 5 mm. No Murphy sign.  Common bile duct:  Diameter: Normal at 5.7 mm.  Liver:  Findings consistent with cirrhosis. Lobular margins with prominent left lobe. Slow flow in the portal vein which remains hepatopetal. No evidence of focal mass lesion.  IVC:  No abnormality visualized.  Pancreas:  Poorly seen because of overlying bowel gas  Spleen:  Upper limits of normal with a length of 9 cm.  Right Kidney:  Length: 14.2 cm in length. No focal lesion. No hydronephrosis. Echogenicity within normal limits.  Left Kidney:  Length: 13.9 cm in length. No focal lesions or hydronephrosis. Echogenicity within normal limits.  Abdominal aorta:  Atherosclerosis without aneurysm.  Other findings:  Prominent diffuse ascites.  IMPRESSION: Findings consistent with cirrhosis of the liver and diffuse ascites. Slow flow in the portal vein which remains hepatopetal at the moment.  Multiple stones in a contracted gallbladder. Slight  gallbladder wall thickening, frequently seen in the setting of ascites.   Electronically Signed   By: Nelson Chimes M.D.   On: 04/26/2013 08:34    Microbiology: Recent Results (from the past 240 hour(s))  URINE CULTURE     Status: None   Collection Time    04/26/13 12:37 AM      Result Value Range Status   Specimen Description URINE, CLEAN CATCH   Final   Special Requests NONE   Final   Culture  Setup Time     Final   Value: 04/26/2013 01:28     Performed at [facility]     Final   Value: 60,000 COLONIES/ML     Performed at [facility]   Culture     Final   Value: LACTOBACILLUS SPECIES     Note: Standardized susceptibility testing for this organism is not available.     Performed at [facility]   Report Status 04/27/2013 FINAL   Final     Labs: Basic Metabolic Panel:  Recent Labs Lab 04/26/13 0010 04/26/13 1500 04/27/13 0710 04/28/13 0815 04/29/13 0328  NA 140  --  140 134* 133*  K 3.9 4.4 4.0 3.8 3.6*  CL 105  --  105 101 100  CO2 26  --  25 26 25   GLUCOSE 116*  --  96 104* 131*  BUN 10  --  10 10 10   CREATININE 0.85  --  0.90 0.85 0.91  CALCIUM 7.8*  --  7.9* 7.4* 7.4*   Liver Function Tests:  Recent Labs Lab 04/26/13 0010 04/27/13 0710  AST 139* 136*  ALT 59* 57*  ALKPHOS 240* 190*  BILITOT 2.0* 2.0*  PROT 7.0 6.8  ALBUMIN 1.8* 1.7*   No results found for this basename: LIPASE, AMYLASE,  in the last 168 hours  Recent Labs Lab 04/26/13 0010  AMMONIA 79*   CBC:  Recent Labs Lab 04/26/13 0010 04/27/13 0710 04/29/13 0328  WBC 3.6* 3.7* 3.0*  NEUTROABS 1.7  --   --   HGB 13.7 13.4 12.4*  HCT 38.3* 38.7* 35.9*  MCV 109.4* 109.9* 109.1*  PLT 35* 37* 33*   Cardiac Enzymes: No results found for this basename: CKTOTAL, CKMB, CKMBINDEX, TROPONINI,  in the last 168 hours BNP: BNP (last 3 results) No results found for this basename: PROBNP,  in the last 8760 hours CBG: No results found for this basename:  GLUCAP,  in the last 168 hours     Signed:  [place] L  [facility] 04/29/2013, 1:59 PM

## 2013-06-06 ENCOUNTER — Ambulatory Visit: Payer: Medicaid Other | Attending: Internal Medicine | Admitting: Internal Medicine

## 2013-06-06 ENCOUNTER — Encounter: Payer: Self-pay | Admitting: Internal Medicine

## 2013-06-06 VITALS — BP 144/76 | HR 58 | Temp 98.4°F | Resp 16 | Ht 66.0 in | Wt 212.0 lb

## 2013-06-06 DIAGNOSIS — B1920 Unspecified viral hepatitis C without hepatic coma: Secondary | ICD-10-CM

## 2013-06-06 DIAGNOSIS — F17200 Nicotine dependence, unspecified, uncomplicated: Secondary | ICD-10-CM | POA: Insufficient documentation

## 2013-06-06 DIAGNOSIS — Z139 Encounter for screening, unspecified: Secondary | ICD-10-CM

## 2013-06-06 DIAGNOSIS — R601 Generalized edema: Secondary | ICD-10-CM

## 2013-06-06 DIAGNOSIS — F1911 Other psychoactive substance abuse, in remission: Secondary | ICD-10-CM

## 2013-06-06 DIAGNOSIS — R609 Edema, unspecified: Secondary | ICD-10-CM

## 2013-06-06 DIAGNOSIS — F1910 Other psychoactive substance abuse, uncomplicated: Secondary | ICD-10-CM

## 2013-06-06 DIAGNOSIS — D696 Thrombocytopenia, unspecified: Secondary | ICD-10-CM

## 2013-06-06 DIAGNOSIS — I10 Essential (primary) hypertension: Secondary | ICD-10-CM

## 2013-06-06 DIAGNOSIS — Z760 Encounter for issue of repeat prescription: Secondary | ICD-10-CM | POA: Insufficient documentation

## 2013-06-06 LAB — LIPID PANEL
CHOLESTEROL: 95 mg/dL (ref 0–200)
HDL: 32 mg/dL — ABNORMAL LOW (ref >39)
LDL Cholesterol: 44 mg/dL (ref 0–99)
Total CHOL/HDL Ratio: 3 Ratio
Triglycerides: 94 mg/dL (ref <150)
VLDL: 19 mg/dL (ref 0–40)

## 2013-06-06 LAB — COMPLETE METABOLIC PANEL WITH GFR
ALT: 73 U/L — ABNORMAL HIGH (ref 0–53)
AST: 133 U/L — ABNORMAL HIGH (ref 0–37)
Albumin: 2.3 g/dL — ABNORMAL LOW (ref 3.5–5.2)
Alkaline Phosphatase: 149 U/L — ABNORMAL HIGH (ref 39–117)
BUN: 9 mg/dL (ref 6–23)
CHLORIDE: 109 meq/L (ref 96–112)
CO2: 22 mEq/L (ref 19–32)
Calcium: 7.7 mg/dL — ABNORMAL LOW (ref 8.4–10.5)
Creat: 0.73 mg/dL (ref 0.50–1.35)
GFR, Est African American: >89 mL/min
Glucose, Bld: 141 mg/dL — ABNORMAL HIGH (ref 70–99)
POTASSIUM: 3.8 meq/L (ref 3.5–5.3)
Sodium: 136 mEq/L (ref 135–145)
Total Bilirubin: 1.5 mg/dL — ABNORMAL HIGH (ref 0.2–1.2)
Total Protein: 6.5 g/dL (ref 6.0–8.3)

## 2013-06-06 LAB — TSH: TSH: 3.502 u[IU]/mL (ref 0.350–4.500)

## 2013-06-06 MED ORDER — SPIRONOLACTONE 100 MG PO TABS: 100.0000 mg | ORAL_TABLET | Freq: Every day | ORAL | Status: DC

## 2013-06-06 MED ORDER — FUROSEMIDE 40 MG PO TABS: 60.0000 mg | ORAL_TABLET | Freq: Two times a day (BID) | ORAL | Status: DC

## 2013-06-06 NOTE — Progress Notes (Signed)
Patient Demographics  Jose [name], is a 58 y.o. male  XFG:[id number]  [id number]:[id number]  DOB - [birth year]  CC:  Chief Complaint  Patient presents with  . Establish Care  . Hypertension  . Hepatitis C       HPI: Jose [name] is a 58 y.o. male here today to establish medical care. he has history of hypertension, hepatitis C, thrombocytopenia UTI anasarca, 6 weeks ago patient was hospitalized with swelling and abdominal scrotum and lower extremities , EMR reviewed, patient was treated with IV Lasix GI was also on board, patient symptomatically improved and was discharged on Lasix and spironolactone, as per patient he then out of his medication still has some swelling in the groin area and scrotal area denies any urinary symptoms denies any change in bowel habits, is requesting refill on his medications. Patient has No headache, No chest pain, No abdominal pain - No Nausea, No new weakness tingling or numbness, No Cough - SOB.  No Known Allergies Past Medical History  Diagnosis Date  . Hypertension   . Hepatitis C   . Alcohol abuse   . Ascites    No current outpatient prescriptions on file prior to visit.   No current facility-administered medications on file prior to visit.   Family History  Problem Relation Age of Onset  . Breast cancer Mother   . Hypertension Mother   . Diabetes Father   . Hypertension Sister   . Heart disease Sister   . Hypertension Paternal Grandmother    History   Social History  . Marital Status: Married    Spouse Name: N/A    Number of Children: N/A  . Years of Education: N/A   Occupational History  . Not on file.   Social History Main Topics  . Smoking status: Light Tobacco Smoker -- 0.50 packs/day    Types: Cigarettes  . Smokeless tobacco: Never Used     Comment: STATES HE SMOKES 2-3 TIMES MONTHLY  . Alcohol Use: Yes     Comment: "couple beers a day"  . Drug Use: Yes    Special: Marijuana, Cocaine     Comment: Last week  . Sexual  Activity: Not Currently   Other Topics Concern  . Not on file   Social History Narrative  . No narrative on file    Review of Systems: Constitutional: Negative for fever, chills, diaphoresis, activity change, appetite change and fatigue. HENT: Negative for ear pain, nosebleeds, congestion, facial swelling, rhinorrhea, neck pain, neck stiffness and ear discharge.  Eyes: Negative for pain, discharge, redness, itching and visual disturbance. Respiratory: Negative for cough, choking, chest tightness, shortness of breath, wheezing and stridor.  Cardiovascular: Negative for chest pain, palpitations and +leg swelling. Gastrointestinal: Negative for abdominal distention. Genitourinary: Negative for dysuria, urgency, frequency, hematuria, flank pain, decreased urine volume, difficulty urinating and dyspareunia. + Scrotal swelling Musculoskeletal: Negative for back pain, joint swelling, arthralgia and gait problem. Neurological: Negative for dizziness, tremors, seizures, syncope, facial asymmetry, speech difficulty, weakness, light-headedness, numbness and headaches.  Hematological: Negative for adenopathy. Does not bruise/bleed easily. Psychiatric/Behavioral: Negative for hallucinations, behavioral problems, confusion, dysphoric mood, decreased concentration and agitation.    Objective:   Filed Vitals:   06/06/13 1142  BP: 144/76  Pulse: 58  Temp: 98.4 F (36.9 C)  Resp: 16    Physical Exam: Constitutional: Patient appears well-developed and well-nourished. No distress. HENT: Normocephalic, atraumatic, External right and left ear normal. Oropharynx is clear and moist.  Eyes: Conjunctivae and EOM  are normal. PERRLA, +scleral icterus. Neck: Normal ROM. Neck supple. No JVD. No tracheal deviation. No thyromegaly. CVS: RRR, S1/S2 +, no murmurs, no gallops, no carotid bruit.  Pulmonary: Effort and breath sounds normal, no stridor, rhonchi, wheezes, rales.  Abdominal: Soft. BS +,  +distension, tenderness, rebound or guarding. Scrotal swelling nontender right greater than left, does not increase with coughing.  Musculoskeletal: Normal range of motion. 2 +edema and no tenderness.  Neuro: Alert. Normal reflexes, muscle tone coordination. No cranial nerve deficit. Skin: Skin is warm and dry. No rash noted. Not diaphoretic. No erythema. No pallor. Psychiatric: Normal mood and affect. Behavior, judgment, thought content normal.  Lab Results  Component Value Date   WBC 3.0* 04/29/2013   HGB 12.4* 04/29/2013   HCT 35.9* 04/29/2013   MCV 109.1* 04/29/2013   PLT 33* 04/29/2013   Lab Results  Component Value Date   CREATININE 0.91 04/29/2013   BUN 10 04/29/2013   NA 133* 04/29/2013   K 3.6* 04/29/2013   CL 100 04/29/2013   CO2 25 04/29/2013    No results found for this basename: HGBA1C   Lipid Panel  No results found for this basename: chol, trig, hdl, cholhdl, vldl, ldlcalc       Assessment and plan:   1. Hypertension Medication refill done. - furosemide (LASIX) 40 MG tablet; Take 1.5 tablets (60 mg total) by mouth 2 (two) times daily.  Dispense: 90 tablet; Refill: 3 - spironolactone (ALDACTONE) 100 MG tablet; Take 1 tablet (100 mg total) by mouth daily.  Dispense: 30 tablet; Refill: 3  2. Hepatitis C  - Ambulatory referral to Gastroenterology  3. Thrombocytopenia, unspecified Will repeat CBC, patient denies any bleeding. - CBC with Differential  4. Screening  - COMPLETE METABOLIC PANEL WITH GFR - Lipid panel - TSH - Vit D  25 hydroxy (rtn osteoporosis monitoring)  5. Anasarca/improved currently more prominent in scrotal area  - furosemide (LASIX) 40 MG tablet; Take 1.5 tablets (60 mg total) by mouth 2 (two) times daily.  Dispense: 90 tablet; Refill: 3 - spironolactone (ALDACTONE) 100 MG tablet; Take 1 tablet (100 mg total) by mouth daily.  Dispense: 30 tablet; Refill: 3  6. History of substance abuse   Return in about 6 weeks (around 07/18/2013).  The patient  was given clear instructions to go to ER or return to medical center if symptoms don't improve, worsen or new problems develop. The patient verbalized understanding. The patient was told to call to get lab results if they haven't heard anything in the next week.     Lorayne Marek, MD

## 2013-06-06 NOTE — Progress Notes (Signed)
Pt here to establish care for Hx  HTN,Hep C  Need medication refill C/o right groin pain-hernia noted with swelling

## 2013-06-07 ENCOUNTER — Telehealth: Payer: Self-pay

## 2013-06-07 LAB — CBC WITH DIFFERENTIAL/PLATELET
Basophils Absolute: 0 10*3/uL (ref 0.0–0.1)
Basophils Relative: 1 % (ref 0–1)
Eosinophils Absolute: 0.1 10*3/uL (ref 0.0–0.7)
Eosinophils Relative: 2 % (ref 0–5)
HEMATOCRIT: 34.5 % — AB (ref 39.0–52.0)
HEMOGLOBIN: 12.2 g/dL — AB (ref 13.0–17.0)
Lymphocytes Relative: 29 % (ref 12–46)
Lymphs Abs: 1 10*3/uL (ref 0.7–4.0)
MCH: 36.2 pg — ABNORMAL HIGH (ref 26.0–34.0)
MCHC: 35.4 g/dL (ref 30.0–36.0)
MCV: 102.4 fL — ABNORMAL HIGH (ref 78.0–100.0)
MONOS PCT: 18 % — AB (ref 3–12)
Monocytes Absolute: 0.6 10*3/uL (ref 0.1–1.0)
NEUTROS ABS: 1.7 10*3/uL (ref 1.7–7.7)
Neutrophils Relative %: 50 % (ref 43–77)
Platelets: 40 10*3/uL — ABNORMAL LOW (ref 150–400)
RBC: 3.37 MIL/uL — ABNORMAL LOW (ref 4.22–5.81)
RDW: 14.3 % (ref 11.5–15.5)
WBC: 3.4 10*3/uL — ABNORMAL LOW (ref 4.0–10.5)

## 2013-06-07 LAB — VITAMIN D 25 HYDROXY (VIT D DEFICIENCY, FRACTURES): Vit D, 25-Hydroxy: <10 ng/mL — ABNORMAL LOW (ref 30–89)

## 2013-06-07 NOTE — Telephone Encounter (Signed)
[facility] called with critical lab Platelets at 40- test was repeated and verified and  Confirmed with a smear Dr Annitta Needs made aware

## 2013-06-07 NOTE — Telephone Encounter (Signed)
Message copied by Dorothe Pea on Tue Jun 07, 2013  2:22 PM ------      Message from: Lorayne Marek      Created: Tue Jun 07, 2013  9:24 AM       Blood work reviewed, noticed low vitamin D, call patient advise to start ergocalciferol 50,000 units once a week for the duration of  12 weeks.       LFTs are stable, CBC shows improvement in the platelet count, advise patient to abstain from alcohol and followup with the GI (history of hepatitis C) ------

## 2013-06-07 NOTE — Telephone Encounter (Signed)
Patient not available Could not leave message Mailbox was full

## 2013-06-10 ENCOUNTER — Ambulatory Visit: Payer: Medicaid Other | Attending: Internal Medicine

## 2013-06-26 HISTORY — PX: COLONOSCOPY: SHX174

## 2013-07-13 ENCOUNTER — Ambulatory Visit: Payer: No Typology Code available for payment source | Attending: Internal Medicine | Admitting: Internal Medicine

## 2013-07-13 ENCOUNTER — Encounter: Payer: Self-pay | Admitting: Internal Medicine

## 2013-07-13 VITALS — BP 147/88 | HR 60 | Temp 98.2°F | Resp 17 | Wt 199.8 lb

## 2013-07-13 DIAGNOSIS — B1920 Unspecified viral hepatitis C without hepatic coma: Secondary | ICD-10-CM | POA: Insufficient documentation

## 2013-07-13 DIAGNOSIS — R7301 Impaired fasting glucose: Secondary | ICD-10-CM | POA: Insufficient documentation

## 2013-07-13 DIAGNOSIS — Z79899 Other long term (current) drug therapy: Secondary | ICD-10-CM | POA: Insufficient documentation

## 2013-07-13 DIAGNOSIS — E559 Vitamin D deficiency, unspecified: Secondary | ICD-10-CM | POA: Insufficient documentation

## 2013-07-13 DIAGNOSIS — F17200 Nicotine dependence, unspecified, uncomplicated: Secondary | ICD-10-CM | POA: Insufficient documentation

## 2013-07-13 DIAGNOSIS — I10 Essential (primary) hypertension: Secondary | ICD-10-CM | POA: Insufficient documentation

## 2013-07-13 MED ORDER — VITAMIN D (ERGOCALCIFEROL) 1.25 MG (50000 UNIT) PO CAPS: 50000.0000 [IU] | ORAL_CAPSULE | ORAL | Status: DC

## 2013-07-13 NOTE — Progress Notes (Signed)
Patient here for follow up for HTN History of Hep C

## 2013-07-13 NOTE — Patient Instructions (Addendum)
DASH Diet The DASH diet stands for "Dietary Approaches to Stop Hypertension." It is a healthy eating plan that has been shown to reduce high blood pressure (hypertension) in as little as 14 days, while also possibly providing other significant health benefits. These other health benefits include reducing the risk of breast cancer after menopause and reducing the risk of type 2 diabetes, heart disease, colon cancer, and stroke. Health benefits also include weight loss and slowing kidney failure in patients with chronic kidney disease.  DIET GUIDELINES  Limit salt (sodium). Your diet should contain less than 1500 mg of sodium daily.  Limit refined or processed carbohydrates. Your diet should include mostly whole grains. Desserts and added sugars should be used sparingly.  Include small amounts of heart-healthy fats. These types of fats include nuts, oils, and tub margarine. Limit saturated and trans fats. These fats have been shown to be harmful in the body. CHOOSING FOODS  The following food groups are based on a 2000 calorie diet. See your Registered Dietitian for individual calorie needs. Grains and Grain Products (6 to 8 servings daily)  Eat More Often: Whole-wheat bread, brown rice, whole-grain or wheat pasta, quinoa, popcorn without added fat or salt (air popped).  Eat Less Often: White bread, white pasta, white rice, cornbread. Vegetables (4 to 5 servings daily)  Eat More Often: Fresh, frozen, and canned vegetables. Vegetables may be raw, steamed, roasted, or grilled with a minimal amount of fat.  Eat Less Often/Avoid: Creamed or fried vegetables. Vegetables in a cheese sauce. Fruit (4 to 5 servings daily)  Eat More Often: All fresh, canned (in natural juice), or frozen fruits. Dried fruits without added sugar. One hundred percent fruit juice ( cup [237 mL] daily).  Eat Less Often: Dried fruits with added sugar. Canned fruit in light or heavy syrup. Lean Meats, Fish, and Poultry (2  servings or less daily. One serving is 3 to 4 oz [85-114 g]).  Eat More Often: Ninety percent or leaner ground beef, tenderloin, sirloin. Round cuts of beef, chicken breast, turkey breast. All fish. Grill, bake, or broil your meat. Nothing should be fried.  Eat Less Often/Avoid: Fatty cuts of meat, turkey, or chicken leg, thigh, or wing. Fried cuts of meat or fish. Dairy (2 to 3 servings)  Eat More Often: Low-fat or fat-free milk, low-fat plain or light yogurt, reduced-fat or part-skim cheese.  Eat Less Often/Avoid: Milk (whole, 2%).Whole milk yogurt. Full-fat cheeses. Nuts, Seeds, and Legumes (4 to 5 servings per week)  Eat More Often: All without added salt.  Eat Less Often/Avoid: Salted nuts and seeds, canned beans with added salt. Fats and Sweets (limited)  Eat More Often: Vegetable oils, tub margarines without trans fats, sugar-free gelatin. Mayonnaise and salad dressings.  Eat Less Often/Avoid: Coconut oils, palm oils, butter, stick margarine, cream, half and half, cookies, candy, pie. FOR MORE INFORMATION The Dash Diet Eating Plan: www.dashdiet.org Document Released: 04/03/2011 Document Revised: 07/07/2011 Document Reviewed: 04/03/2011 Exit[employer] Patient Information 2014 [employer]. Diabetes Meal Planning Guide The diabetes meal planning guide is a tool to help you plan your meals and snacks. It is important for people with diabetes to manage their blood glucose (sugar) levels. Choosing the right foods and the right amounts throughout your day will help control your blood glucose. Eating right can even help you improve your blood pressure and reach or maintain a healthy weight. CARBOHYDRATE COUNTING MADE EASY When you eat carbohydrates, they turn to sugar. This raises your blood glucose level. Counting carbohydrates   can help you control this level so you feel better. When you plan your meals by counting carbohydrates, you can have more flexibility in what you eat and balance  your medicine with your food intake. Carbohydrate counting simply means adding up the total amount of carbohydrate grams in your meals and snacks. Try to eat about the same amount at each meal. Foods with carbohydrates are listed below. Each portion below is 1 carbohydrate serving or 15 grams of carbohydrates. Ask your dietician how many grams of carbohydrates you should eat at each meal or snack. Grains and Starches  1 slice bread.   English muffin or hotdog/hamburger bun.   cup cold cereal (unsweetened).   cup cooked pasta or rice.   cup starchy vegetables (corn, potatoes, peas, beans, winter squash).  1 tortilla (6 inches).   bagel.  1 waffle or pancake (size of a CD).   cup cooked cereal.  4 to 6 small crackers. *Whole grain is recommended. Fruit  1 cup fresh unsweetened berries, melon, papaya, pineapple.  1 small fresh fruit.   banana or mango.   cup fruit juice (4 oz unsweetened).   cup canned fruit in natural juice or water.  2 tbs dried fruit.  12 to 15 grapes or cherries. Milk and Yogurt  1 cup fat-free or 1% milk.  1 cup soy milk.  6 oz light yogurt with sugar-free sweetener.  6 oz low-fat soy yogurt.  6 oz plain yogurt. Vegetables  1 cup raw or  cup cooked is counted as 0 carbohydrates or a "free" food.  If you eat 3 or more servings at 1 meal, count them as 1 carbohydrate serving. Other Carbohydrates   oz chips or pretzels.   cup ice cream or frozen yogurt.   cup sherbet or sorbet.  2 inch square cake, no frosting.  1 tbs honey, sugar, jam, jelly, or syrup.  2 small cookies.  3 squares of graham crackers.  3 cups popcorn.  6 crackers.  1 cup broth-based soup.  Count 1 cup casserole or other mixed foods as 2 carbohydrate servings.  Foods with less than 20 calories in a serving may be counted as 0 carbohydrates or a "free" food. You may want to purchase a book or computer software that lists the carbohydrate gram  counts of different foods. In addition, the nutrition facts panel on the labels of the foods you eat are a good source of this information. The label will tell you how big the serving size is and the total number of carbohydrate grams you will be eating per serving. Divide this number by 15 to obtain the number of carbohydrate servings in a portion. Remember, 1 carbohydrate serving equals 15 grams of carbohydrate. SERVING SIZES Measuring foods and serving sizes helps you make sure you are getting the right amount of food. The list below tells how big or small some common serving sizes are.  1 oz.........4 stacked dice.  3 oz.........Deck of cards.  1 tsp........Tip of little finger.  1 tbs........Thumb.  2 tbs........Golf ball.   cup.......Half of a fist.  1 cup........A fist. SAMPLE DIABETES MEAL PLAN Below is a sample meal plan that includes foods from the grain and starches, dairy, vegetable, fruit, and meat groups. A dietician can individualize a meal plan to fit your calorie needs and tell you the number of servings needed from each food group. However, controlling the total amount of carbohydrates in your meal or snack is more important than making sure you   include all of the food groups at every meal. You may interchange carbohydrate containing foods (dairy, starches, and fruits). The meal plan below is an example of a 2000 calorie diet using carbohydrate counting. This meal plan has 17 carbohydrate servings. Breakfast  1 cup oatmeal (2 carb servings).   cup light yogurt (1 carb serving).  1 cup blueberries (1 carb serving).   cup almonds. Snack  1 large apple (2 carb servings).  1 low-fat string cheese stick. Lunch  Chicken breast salad.  1 cup spinach.   cup chopped tomatoes.  2 oz chicken breast, sliced.  2 tbs low-fat Italian dressing.  12 whole-wheat crackers (2 carb servings).  12 to 15 grapes (1 carb serving).  1 cup low-fat milk (1 carb  serving). Snack  1 cup carrots.   cup hummus (1 carb serving). Dinner  3 oz broiled salmon.  1 cup brown rice (3 carb servings). Snack  1  cups steamed broccoli (1 carb serving) drizzled with 1 tsp olive oil and lemon juice.  1 cup light pudding (2 carb servings). DIABETES MEAL PLANNING WORKSHEET Your dietician can use this worksheet to help you decide how many servings of foods and what types of foods are right for you.  BREAKFAST Food Group and Servings / Carb Servings Grain/Starches __________________________________ Dairy __________________________________________ Vegetable ______________________________________ Fruit ___________________________________________ Meat __________________________________________ Fat ____________________________________________ LUNCH Food Group and Servings / Carb Servings Grain/Starches ___________________________________ Dairy ___________________________________________ Fruit ____________________________________________ Meat ___________________________________________ Fat _____________________________________________ DINNER Food Group and Servings / Carb Servings Grain/Starches ___________________________________ Dairy ___________________________________________ Fruit ____________________________________________ Meat ___________________________________________ Fat _____________________________________________ SNACKS Food Group and Servings / Carb Servings Grain/Starches ___________________________________ Dairy ___________________________________________ Vegetable _______________________________________ Fruit ____________________________________________ Meat ___________________________________________ Fat _____________________________________________ DAILY TOTALS Starches _________________________ Vegetable ________________________ Fruit ____________________________ Dairy ____________________________ Meat  ____________________________ Fat ______________________________ Document Released: 01/09/2005 Document Revised: 07/07/2011 Document Reviewed: 11/20/2008 ExitCare Patient Information 2014 [employer].  

## 2013-07-13 NOTE — Progress Notes (Signed)
MRN: [id number] Name: Jose [name]  Sex: male Age: 58 y.o. DOB: [birth year]  Allergies: Review of patient's allergies indicates no known allergies.  Chief Complaint  Patient presents with  . Follow-up    HPI: Patient is 58 y.o. male who has History of hypertension and is on Lasix and Aldactone comes today for followup , also history of hepatitis C. patient has already been referred to GI, patient had a blood work done which was reviewed with the patient noticed impaired fasting glucose as well as vitamin D deficiency. Patient denies any acute symptoms  Past Medical History  Diagnosis Date  . Hypertension   . Hepatitis C   . Alcohol abuse   . Ascites     History reviewed. No pertinent past surgical history.    Medication List       This list is accurate as of: 07/13/13  2:43 PM.  Always use your most recent med list.               furosemide 40 MG tablet  Commonly known as:  LASIX  Take 1.5 tablets (60 mg total) by mouth 2 (two) times daily.     spironolactone 100 MG tablet  Commonly known as:  ALDACTONE  Take 1 tablet (100 mg total) by mouth daily.     Vitamin D (Ergocalciferol) 50000 UNITS Caps capsule  Commonly known as:  DRISDOL  Take 1 capsule (50,000 Units total) by mouth every 7 (seven) days.        Meds ordered this encounter  Medications  . Vitamin D, Ergocalciferol, (DRISDOL) 50000 UNITS CAPS capsule    Sig: Take 1 capsule (50,000 Units total) by mouth every 7 (seven) days.    Dispense:  12 capsule    Refill:  0    Immunization History  Administered Date(s) Administered  . Influenza,inj,Quad PF,36+ Mos 04/27/2013    Family History  Problem Relation Age of Onset  . Breast cancer Mother   . Hypertension Mother   . Diabetes Father   . Hypertension Sister   . Heart disease Sister   . Hypertension Paternal Grandmother     History  Substance Use Topics  . Smoking status: Light Tobacco Smoker -- 0.50 packs/day    Types: Cigarettes  .  Smokeless tobacco: Never Used     Comment: STATES HE SMOKES 2-3 TIMES MONTHLY  . Alcohol Use: Yes     Comment: "couple beers a day"    Review of Systems   As noted in HPI  Filed Vitals:   07/13/13 1425  BP: 147/88  Pulse: 60  Temp: 98.2 F (36.8 C)  Resp: 17    Physical Exam  Physical Exam  Constitutional: No distress.  Eyes: EOM are normal. Pupils are equal, round, and reactive to light.  Cardiovascular: Normal rate and regular rhythm.   Pulmonary/Chest: Breath sounds normal. No respiratory distress. He has no wheezes. He has no rales.  Musculoskeletal: He exhibits no edema.  Skin: Skin is dry.    CBC    Component Value Date/Time   WBC 3.4* 06/06/2013 1211   WBC 3.2* 10/13/2005 1212   RBC 3.37* 06/06/2013 1211   RBC 4.15* 10/13/2005 1212   HGB 12.2* 06/06/2013 1211   HGB 14.9 10/13/2005 1212   HCT 34.5* 06/06/2013 1211   HCT 42.6 10/13/2005 1212   PLT 40* 06/06/2013 1211   PLT 40* 10/13/2005 1212   MCV 102.4* 06/06/2013 1211   MCV 102.6* 10/13/2005 1212   LYMPHSABS  1.0 06/06/2013 1211   LYMPHSABS 1.2 10/13/2005 1212   MONOABS 0.6 06/06/2013 1211   MONOABS 0.5 10/13/2005 1212   EOSABS 0.1 06/06/2013 1211   EOSABS 0.1 10/13/2005 1212   BASOSABS 0.0 06/06/2013 1211   BASOSABS 0.0 10/13/2005 1212    CMP     Component Value Date/Time   NA 136 06/06/2013 1211   K 3.8 06/06/2013 1211   CL 109 06/06/2013 1211   CO2 22 06/06/2013 1211   GLUCOSE 141* 06/06/2013 1211   BUN 9 06/06/2013 1211   CREATININE 0.73 06/06/2013 1211   CREATININE 0.91 04/29/2013 0328   CALCIUM 7.7* 06/06/2013 1211   PROT 6.5 06/06/2013 1211   ALBUMIN 2.3* 06/06/2013 1211   AST 133* 06/06/2013 1211   ALT 73* 06/06/2013 1211   ALKPHOS 149* 06/06/2013 1211   BILITOT 1.5* 06/06/2013 1211   GFRNONAA >90 04/29/2013 0328   GFRAA >90 04/29/2013 0328    Lab Results  Component Value Date/Time   CHOL 95 06/06/2013 12:11 PM    No components found with this basename: hga1c    Lab Results  Component Value Date/Time   AST 133* 06/06/2013 12:11 PM      Assessment and Plan  Hypertension Advise patient for low salt diet continue with current medications  Hepatitis C Patient is in the process to see GI at [facility].  Unspecified vitamin D deficiency - Plan: Vitamin D, Ergocalciferol, (DRISDOL) 50000 UNITS CAPS capsule  IFG (impaired fasting glucose) Advise patient for low carbohydrate diet, will repeat fasting blood chemistry as well as check hemoglobin A1c on next visit.   Return in about 3 months (around 10/13/2013) for hypertension.  Lorayne Marek, MD

## 2013-08-16 ENCOUNTER — Encounter: Payer: Self-pay | Admitting: Internal Medicine

## 2013-08-19 ENCOUNTER — Telehealth: Payer: Self-pay | Admitting: Internal Medicine

## 2013-08-19 NOTE — Telephone Encounter (Signed)
Pt came to request referral to GI doctor.   [place]

## 2013-08-23 ENCOUNTER — Other Ambulatory Visit: Payer: Self-pay

## 2013-08-23 ENCOUNTER — Telehealth: Payer: Self-pay

## 2013-08-23 DIAGNOSIS — Z0000 Encounter for general adult medical examination without abnormal findings: Secondary | ICD-10-CM

## 2013-08-23 NOTE — Telephone Encounter (Signed)
Patient called  Requesting a referral to GI Referral put into epic

## 2013-09-16 ENCOUNTER — Telehealth: Payer: Self-pay

## 2013-09-16 NOTE — Telephone Encounter (Signed)
Pt was referred by [facility] for screening colonoscopy. He said he had one about a month ago in [place] and he was not cleaned out good and they told him to have another one.  Bringing in for OV to gather more info and then schedule.   He is scheduled for OV with Laban Emperor, NP on 10/18/2013 at 1:30 PM.

## 2013-10-13 ENCOUNTER — Ambulatory Visit: Payer: Medicaid Other | Attending: Internal Medicine | Admitting: Internal Medicine

## 2013-10-13 ENCOUNTER — Encounter: Payer: Self-pay | Admitting: Internal Medicine

## 2013-10-13 VITALS — BP 128/81 | HR 56 | Temp 98.1°F | Resp 16 | Ht 66.0 in | Wt 205.6 lb

## 2013-10-13 DIAGNOSIS — I10 Essential (primary) hypertension: Secondary | ICD-10-CM | POA: Diagnosis not present

## 2013-10-13 DIAGNOSIS — R252 Cramp and spasm: Secondary | ICD-10-CM | POA: Diagnosis not present

## 2013-10-13 DIAGNOSIS — Z09 Encounter for follow-up examination after completed treatment for conditions other than malignant neoplasm: Secondary | ICD-10-CM | POA: Diagnosis present

## 2013-10-13 DIAGNOSIS — E559 Vitamin D deficiency, unspecified: Secondary | ICD-10-CM | POA: Diagnosis not present

## 2013-10-13 DIAGNOSIS — B1920 Unspecified viral hepatitis C without hepatic coma: Secondary | ICD-10-CM | POA: Diagnosis not present

## 2013-10-13 DIAGNOSIS — F1010 Alcohol abuse, uncomplicated: Secondary | ICD-10-CM | POA: Insufficient documentation

## 2013-10-13 DIAGNOSIS — F17200 Nicotine dependence, unspecified, uncomplicated: Secondary | ICD-10-CM | POA: Insufficient documentation

## 2013-10-13 LAB — COMPLETE METABOLIC PANEL WITH GFR
ALT: 58 U/L — AB (ref 0–53)
AST: 106 U/L — AB (ref 0–37)
Albumin: 2.8 g/dL — ABNORMAL LOW (ref 3.5–5.2)
Alkaline Phosphatase: 254 U/L — ABNORMAL HIGH (ref 39–117)
BUN: 11 mg/dL (ref 6–23)
CALCIUM: 8 mg/dL — AB (ref 8.4–10.5)
CHLORIDE: 107 meq/L (ref 96–112)
CO2: 22 mEq/L (ref 19–32)
Creat: 0.8 mg/dL (ref 0.50–1.35)
GFR, Est African American: >89 mL/min
GFR, Est Non African American: >89 mL/min
Glucose, Bld: 112 mg/dL — ABNORMAL HIGH (ref 70–99)
Potassium: 4 mEq/L (ref 3.5–5.3)
Sodium: 134 mEq/L — ABNORMAL LOW (ref 135–145)
Total Bilirubin: 1.5 mg/dL — ABNORMAL HIGH (ref 0.2–1.2)
Total Protein: 7.1 g/dL (ref 6.0–8.3)

## 2013-10-13 LAB — MAGNESIUM: MAGNESIUM: 1.7 mg/dL (ref 1.5–2.5)

## 2013-10-13 MED ORDER — SPIRONOLACTONE 100 MG PO TABS: 100.0000 mg | ORAL_TABLET | Freq: Every day | ORAL | Status: DC

## 2013-10-13 MED ORDER — VITAMIN D 1000 UNITS PO CAPS: 1000.0000 [IU] | ORAL_CAPSULE | Freq: Every day | ORAL | Status: DC

## 2013-10-13 MED ORDER — FUROSEMIDE 40 MG PO TABS: 60.0000 mg | ORAL_TABLET | Freq: Two times a day (BID) | ORAL | Status: DC

## 2013-10-13 NOTE — Progress Notes (Signed)
Patient ID: Jose [name], male   DOB: 01-27-1956, 58 y.o.   MRN: [id number]  CC: f/u   HPI: Patient presents today for follow up of HTN and hepatitis C.  Patient reports that he still drinks a 20 oz of beer daily.  He reports that he continues to take his fluid pills daily.  Patient states that his groin only has slight swelling at the moment.  Patient reports that he went to [facility] for treatment of his Hep C but he has a hard time making the appointments due to transprotation.  Patient c/o of leg/arm cramps after working all day, has been presents for 1 year.    No Known Allergies Past Medical History  Diagnosis Date  . Hypertension   . Hepatitis C   . Alcohol abuse   . Ascites    Current Outpatient Prescriptions on File Prior to Visit  Medication Sig Dispense Refill  . furosemide (LASIX) 40 MG tablet Take 1.5 tablets (60 mg total) by mouth 2 (two) times daily.  90 tablet  3  . spironolactone (ALDACTONE) 100 MG tablet Take 1 tablet (100 mg total) by mouth daily.  30 tablet  3  . Vitamin D, Ergocalciferol, (DRISDOL) 50000 UNITS CAPS capsule Take 1 capsule (50,000 Units total) by mouth every 7 (seven) days.  12 capsule  0   No current facility-administered medications on file prior to visit.   Family History  Problem Relation Age of Onset  . Breast cancer Mother   . Hypertension Mother   . Diabetes Father   . Hypertension Sister   . Heart disease Sister   . Hypertension Paternal Grandmother    History   Social History  . Marital Status: Married    Spouse Name: N/A    Number of Children: N/A  . Years of Education: N/A   Occupational History  . Not on file.   Social History Main Topics  . Smoking status: Light Tobacco Smoker -- 0.50 packs/day    Types: Cigarettes  . Smokeless tobacco: Never Used     Comment: STATES HE SMOKES 2-3 TIMES MONTHLY  . Alcohol Use: Yes     Comment: "couple beers a day"  . Drug Use: Yes    Special: Marijuana, Cocaine     Comment: Last  week  . Sexual Activity: Not Currently   Other Topics Concern  . Not on file   Social History Narrative  . No narrative on file    Review of Systems: See HPI    Objective:   Filed Vitals:   10/13/13 1109  BP: 128/81  Pulse: 56  Temp: 98.1 F (36.7 C)  Resp: 16   Physical Exam  Vitals reviewed. Constitutional: He is oriented to person, place, and time.  Cardiovascular: Normal rate, regular rhythm and normal heart sounds.   Pulmonary/Chest: Effort normal and breath sounds normal.  Abdominal: Soft. Bowel sounds are normal. He exhibits distension (ascites). There is no tenderness.  Genitourinary:  Minimal edema in scrotum  Neurological: He is alert and oriented to person, place, and time. He has normal reflexes.  Skin: Skin is dry.     Lab Results  Component Value Date   WBC 3.4* 06/06/2013   HGB 12.2* 06/06/2013   HCT 34.5* 06/06/2013   MCV 102.4* 06/06/2013   PLT 40* 06/06/2013   Lab Results  Component Value Date   CREATININE 0.73 06/06/2013   BUN 9 06/06/2013   NA 136 06/06/2013   K 3.8 06/06/2013  CL 109 06/06/2013   CO2 22 06/06/2013    No results found for this basename: HGBA1C   Lipid Panel     Component Value Date/Time   CHOL 95 06/06/2013 1211   TRIG 94 06/06/2013 1211   HDL 32* 06/06/2013 1211   CHOLHDL 3.0 06/06/2013 1211   VLDL 19 06/06/2013 1211   LDLCALC 44 06/06/2013 1211       Assessment and plan:   Patrich was seen today for hypertension.  Diagnoses and associated orders for this visit:  Hepatitis C virus infection without hepatic coma, unspecified chronicity - Ambulatory referral to Infectious Disease-Patient will need GI due to liver failure - COMPLETE METABOLIC PANEL WITH GFR  Essential hypertension - spironolactone (ALDACTONE) 100 MG tablet; Take 1 tablet (100 mg total) by mouth daily. - furosemide (LASIX) 40 MG tablet; Take 1.5 tablets (60 mg total) by mouth 2 (two) times daily.  Cramp of both lower extremities - Magnesium  Unspecified vitamin D  deficiency - Cholecalciferol (VITAMIN D) 1000 UNITS capsule; Take 1 capsule (1,000 Units total) by mouth daily.   Return in about 3 months (around 01/13/2014) for Hypertension.      Chari Manning, [facility] [phone] 10/17/2013, 11:31 AM

## 2013-10-13 NOTE — Patient Instructions (Signed)
Hepatitis C  Hepatitis C is a viral infection of the liver. Infection may go undetected for months or years because symptoms may be absent or very mild. Chronic liver disease is the main danger of hepatitis C. This may lead to scarring of the liver (cirrhosis), liver failure, and liver cancer.  CAUSES   Hepatitis C is caused by the hepatitis C virus (HCV). Formerly, hepatitis C infections were most commonly transmitted through blood transfusions. In the early 1990s, routine testing of donated blood for hepatitis C and exclusion of blood that tests positive for HCV began. Now, HCV is most commonly transmitted from person to person through injection drug use, sharing needles, or sex with an infected person. A caregiver may also get the infection from exposure to the blood of an infected patient by way of a cut or needle stick.   SYMPTOMS   Acute Phase  Many cases of acute HCV infection are mild and cause few problems. Some people may not even realize they are sick. Symptoms in others may last a few weeks to several months and include:  · Feeling very tired.  · Loss of appetite.  · Nausea.  · Vomiting.  · Abdominal pain.  · Dark yellow urine.  · Yellow skin and eyes (jaundice).  · Itching of the skin.  Chronic Phase  · Between 50% to 85% of people who get HCV infection become "chronic carriers." They often have no symptoms, but the virus stays in their body. They may spread the virus to others and can get long-term liver disease.  · Many people with chronic HCV infection remain healthy for many years. However, up to 1 in 5 chronically infected people may develop severe liver diseases including scarring of the liver (cirrhosis), liver failure, or liver cancer.  DIAGNOSIS   Diagnosis of hepatitis C infection is made by testing blood for the presence of hepatitis C viral particles called RNA. Other tests may also be done to measure the status of current liver function, exclude other liver problems, or assess liver  damage.  TREATMENT   Treatment with many antiviral drugs is available and recommended for some patients with chronic HCV infection. Drug treatment is generally considered appropriate for patients who:  · Are 18 years of age or older.  · Have a positive test for HCV particles in the blood.  · Have a liver tissue sample (biopsy) that shows chronic hepatitis and significant scarring (fibrosis).  · Do not have signs of liver failure.  · Have acceptable blood test results that confirm the wellness of other body organs.  · Are willing to be treated and conform to treatment requirements.  · Have no other circumstances that would prevent treatment from being recommended (contraindications).  All people who are offered and choose to receive drug treatment must understand that careful medical follow up for many months and even years is crucial in order to make successful care possible. The goal of drug treatment is to eliminate any evidence of HCV in the blood on a long-term basis. This is called a "sustained virologic response" or SVR. Achieving a SVR is associated with a decrease in the chance of life-threatening liver problems, need for a liver transplant, liver cancer rates, and liver-related complications.  Successful treatment currently requires taking treatment drugs for at least 24 weeks and up to 72 weeks. An injected drug (interferon) given weekly and an oral antiviral medicine taken daily are usually prescribed. Side effects from these drugs are common and some may be very   serious. Your response to treatment must be carefully monitored by both you and your caregiver throughout the entire treatment period.  PREVENTION  There is no vaccine for hepatitis C. The only way to prevent the disease is to reduce the risk of exposure to the virus.   · Avoid sharing drug needles or personal items like toothbrushes, razors, and nail clippers with an infected person.  · Healthcare workers need to avoid injuries and wear  appropriate protective equipment such as gloves, gowns, and face masks when performing invasive medical or nursing procedures.  HOME CARE INSTRUCTIONS   To avoid making your liver disease worse:  · Strictly avoid drinking alcohol.  · Carefully review all new prescriptions of medicines with your caregiver. Ask your caregiver which drugs you should avoid. The following drugs are toxic to the liver, and your caregiver may tell you to avoid them:  ¨ Isoniazid.  ¨ Methyldopa.  ¨ Acetaminophen.  ¨ Anabolic steroids (muscle-building drugs).  ¨ Erythromycin.  ¨ Oral contraceptives (birth control pills).  · Check with your caregiver to make sure medicine you are currently taking will not be harmful.  · Periodic blood tests may be required. Follow your caregiver's advice about when you should have blood tests.  · Avoid a sexual relationship until advised otherwise by your caregiver.  · Avoid activities that could expose other people to your blood. Examples include sharing a toothbrush, nail clippers, razors, and needles.  · Bed rest is not necessary, but it may make you feel better. Recovery time is not related to the amount of rest you receive.  · This infection is contagious. Follow your caregiver's instructions in order to avoid spread of the infection.  SEEK IMMEDIATE MEDICAL CARE IF:  · You have increasing fatigue or weakness.  · You have an oral temperature above 102° F (38.9° C), not controlled by medicine.  · You develop loss of appetite, nausea, or vomiting.  · You develop jaundice.  · You develop easy bruising or bleeding.  · You develop any severe problems as a result of your treatment.  MAKE SURE YOU:   · Understand these instructions.  · Will watch your condition.  · Will get help right away if you are not doing well or get worse.  Document Released: 04/11/2000 Document Revised: 07/07/2011 Document Reviewed: 08/14/2010  ExitCare® Patient Information ©2015 [employer]. This information is not intended to replace  advice given to you by your health care provider. Make sure you discuss any questions you have with your health care provider.

## 2013-10-13 NOTE — Progress Notes (Signed)
Patient here for f/u on HTN Needs a refill on Vitamin D Patient c/o leg cramps last CMP was 05/2013. Potassium was WNL.  Problem with Transportation for [employer] in [place] next week .

## 2013-10-18 ENCOUNTER — Other Ambulatory Visit: Payer: Self-pay | Admitting: Internal Medicine

## 2013-10-18 ENCOUNTER — Ambulatory Visit (INDEPENDENT_AMBULATORY_CARE_PROVIDER_SITE_OTHER): Payer: Medicaid Other | Admitting: Gastroenterology

## 2013-10-18 ENCOUNTER — Other Ambulatory Visit: Payer: Self-pay | Admitting: Gastroenterology

## 2013-10-18 ENCOUNTER — Encounter: Payer: Self-pay | Admitting: Gastroenterology

## 2013-10-18 VITALS — BP 129/76 | HR 52 | Temp 98.1°F | Resp 20 | Ht 66.0 in | Wt 198.0 lb

## 2013-10-18 DIAGNOSIS — D369 Benign neoplasm, unspecified site: Secondary | ICD-10-CM

## 2013-10-18 DIAGNOSIS — K7460 Unspecified cirrhosis of liver: Secondary | ICD-10-CM

## 2013-10-18 DIAGNOSIS — K7469 Other cirrhosis of liver: Secondary | ICD-10-CM

## 2013-10-18 MED ORDER — PEG 3350-KCL-NA BICARB-NACL 420 G PO SOLR: 4000.0000 mL | ORAL | Status: DC

## 2013-10-18 NOTE — Patient Instructions (Signed)
We have scheduled you for a colonoscopy and upper endoscopy with Dr. [name] in the near future.  Further recommendations to follow.   Please let us know if you decide not to be seen in [facility] by Infectious Diseases. You will need long-term follow-up of cirrhosis.

## 2013-10-18 NOTE — Progress Notes (Signed)
Primary Care Physician:  Lorayne Marek, MD Primary Gastroenterologist:  Dr. [name]   Chief Complaint  Patient presents with  . Referral    HPI:   Jose [name] presents today at the request of his PCP secondary to need for colonoscopy. He actually underwent a colonoscopy at [facility] in March 2015 by Dr. Renee Harder, revealing nodular mucosa of appendiceal orifice, tubular adenoma. Very poor prep. Patient states he was told he needed another colonoscopy. I do not have verification of this at time of appointment.   History of chronic Hep C cirrhosis. Referred to [place] for Hep C per PCP. Wants to see if that works out first instead of undergoing treatment here.   Intermittent loose stool, usually diet related. No abdominal pain. Good appetite. No N/V. No GERD. No dysphagia. No rectal bleeding. No prior EGD.   Past Medical History  Diagnosis Date  . Hypertension   . Hepatitis C   . Alcohol abuse   . Ascites   . Cirrhosis     Past Surgical History  Procedure Laterality Date  . Colonoscopy  march 2015    Dr. Renee Harder: nodular mucosa at appendiceal orifice, tubular adenoma, extremely poor prep    Current Outpatient Prescriptions  Medication Sig Dispense Refill  . Cholecalciferol (VITAMIN D) 1000 UNITS capsule Take 1 capsule (1,000 Units total) by mouth daily.  30 capsule  2  . furosemide (LASIX) 40 MG tablet Take 1.5 tablets (60 mg total) by mouth 2 (two) times daily.  90 tablet  3  . spironolactone (ALDACTONE) 100 MG tablet Take 1 tablet (100 mg total) by mouth daily.  30 tablet  3  . polyethylene glycol-electrolytes (TRILYTE) 420 G solution Take 4,000 mLs by mouth as directed.  4000 mL  0   No current facility-administered medications for this visit.    Allergies as of 10/18/2013  . (No Known Allergies)    Family History  Problem Relation Age of Onset  . Breast cancer Mother   . Hypertension Mother   . Diabetes Father   . Hypertension Sister   . Heart disease Sister    . Hypertension Paternal Grandmother   . Colon cancer Neg Hx     History   Social History  . Marital Status: Married    Spouse Name: N/A    Number of Children: N/A  . Years of Education: N/A   Occupational History  . Not on file.   Social History Main Topics  . Smoking status: Light Tobacco Smoker -- 0.50 packs/day    Types: Cigarettes  . Smokeless tobacco: Never Used     Comment: STATES HE SMOKES 2-3 TIMES MONTHLY  . Alcohol Use: Yes     Comment: A couple beers every few days   . Drug Use: Yes    Special: Marijuana, Cocaine     Comment: last few weeks  . Sexual Activity: Not Currently   Other Topics Concern  . Not on file   Social History Narrative  . No narrative on file    Review of Systems: Gen: Denies any fever, chills, fatigue, weight loss, lack of appetite.  CV: Denies chest pain, heart palpitations, peripheral edema, syncope.  Resp: +DOE GI: see HPI GU : Denies urinary burning, urinary frequency, urinary hesitancy MS: complain of leg "weakness" with exertion Derm: Denies rash, itching, dry skin Psych: Denies depression, anxiety, memory loss, and confusion Heme: Denies bruising, bleeding, and enlarged lymph nodes.  Physical Exam: BP 129/76  Pulse  52  Temp(Src) 98.1 F (36.7 C) (Oral)  Resp 20  Ht 5\' 6"  (1.676 m)  Wt 198 lb (89.812 kg)  BMI 31.97 kg/m2 General:   Alert and oriented. Pleasant and cooperative. Well-nourished and well-developed.  Head:  Normocephalic and atraumatic. Ears:  Normal auditory acuity. Nose:  No deformity, discharge,  or lesions. Mouth:  No deformity or lesions, oral mucosa pink.  Neck:  Supple, without mass or thyromegaly. Lungs:  Clear to auscultation bilaterally. No wheezes, rales, or rhonchi. No distress.  Heart:  S1, S2 present without murmurs appreciated.  Abdomen:  +BS, soft, non-tender and non-distended. Umbilical hernia, no HSM Rectal:  Deferred  Msk:  Symmetrical without gross deformities. Normal  posture. Extremities:  Without clubbing or edema. Neurologic:  Alert and  oriented x4;  grossly normal neurologically. Skin:  Intact without significant lesions or rashes. Psych:  Alert and cooperative. Normal mood and affect.   Lab Results  Component Value Date   WBC 3.4* 06/06/2013   HGB 12.2* 06/06/2013   HCT 34.5* 06/06/2013   MCV 102.4* 06/06/2013   PLT 40* 06/06/2013   Lab Results  Component Value Date   ALT 58* 10/13/2013   AST 106* 10/13/2013   ALKPHOS 254* 10/13/2013   BILITOT 1.5* 10/13/2013   [place] of abdomen Dec 2014:  No HCC

## 2013-10-19 ENCOUNTER — Telehealth: Payer: Self-pay | Admitting: Gastroenterology

## 2013-10-19 ENCOUNTER — Encounter: Payer: Self-pay | Admitting: Gastroenterology

## 2013-10-19 ENCOUNTER — Other Ambulatory Visit: Payer: No Typology Code available for payment source

## 2013-10-19 ENCOUNTER — Other Ambulatory Visit: Payer: Self-pay | Admitting: Internal Medicine

## 2013-10-19 DIAGNOSIS — B182 Chronic viral hepatitis C: Secondary | ICD-10-CM

## 2013-10-19 NOTE — Assessment & Plan Note (Signed)
Likely secondary to Hep C. Last abdominal imaging in Dec 2014. Needs routine [place] screening. Unknown immunization status. Patient has been referred to [facility] for Hep C treatment. No prior EGD. Needs variceal screening at time of colonoscopy. He desires to pursue Hep C treatment in [place]; however, he states if transportation is an issue, he will establish care with [place] for treatment. I discussed the need for long-term care due to cirrhosis.

## 2013-10-19 NOTE — Assessment & Plan Note (Addendum)
58 year old male with history of adenomatous polyps noted on colonoscopy at [facility] in March 2015; due to poor prep, he reports a repeat exam has been requested. I have requested operative notes and recommendations from [facility] that he needs an early interval colonoscopy, but I would like to ensure the timing on this first. Obtain outside records, then likely proceed with colonoscopy with PROPOFOL due to drug abuse. Discussed abstinence from illegal substances. Will need urine drug screen prior.   UPDATE: I was able to retrieve the letter from Dr. Renee Harder that indicated patient needed a repeat colonoscopy in 3 months, which would be June 2015. Will proceed with TCS/EGD with Dr. [name]. NEEDS PROPOFOL. Urine drug screen needed.

## 2013-10-19 NOTE — Telephone Encounter (Signed)
I requested records from [place].

## 2013-10-19 NOTE — Telephone Encounter (Signed)
Procedure has been cancelled and patient an Endo are aware

## 2013-10-19 NOTE — Telephone Encounter (Signed)
After further review of records, it may be too early to proceed with a repeat colonoscopy. I would like to have the letter from [facility] that notified patient he needed a repeat exam.   Let's put the TCS/EGD with Propofol on hold until we get the information from [facility], please see if we can get any documentation from [facility] (result letter, etc).   Jose [name]: we may need to cancel that upcoming appt if we do not get results from [facility] in time.

## 2013-10-24 ENCOUNTER — Other Ambulatory Visit: Payer: Self-pay | Admitting: Internal Medicine

## 2013-10-24 NOTE — Telephone Encounter (Signed)
Jose [name], I was able to obtain records through a different online portal instead of care everywhere. Can cancel request.  Darius Bump:   Let's go ahead and proceed with TCS/EGD with Propofol as planned. Needs urine drug screen prior.

## 2013-10-24 NOTE — Telephone Encounter (Signed)
Jose [name] is scheduled with [facility] in the OR on July 16th and he has instructions

## 2013-10-26 ENCOUNTER — Encounter (HOSPITAL_COMMUNITY): Payer: Self-pay | Admitting: Pharmacy Technician

## 2013-11-03 NOTE — Patient Instructions (Signed)
Jose [name]  11/03/2013   Your procedure is scheduled on:  11/10/13  Report to [facility] at [place] AM.  Call this number if you have problems the morning of surgery: [phone]   Remember:   Do not eat food or drink liquids after midnight.   Take these medicines the morning of surgery with A SIP OF WATER: aldactone   Do not wear jewelry, make-up or nail polish.  Do not wear lotions, powders, or perfumes. You may wear deodorant.  Do not shave 48 hours prior to surgery. Men may shave face and neck.  Do not bring valuables to the hospital.  [facility] is not responsible                  for any belongings or valuables.               Contacts, dentures or bridgework may not be worn into surgery.  Leave suitcase in the car. After surgery it may be brought to your room.  For patients admitted to the hospital, discharge time is determined by your                treatment team.               Patients discharged the day of surgery will not be allowed to drive  home.  Name and phone number of your driver: family  Special Instructions: N/A   Please read over the following fact sheets that you were given: Anesthesia Post-op Instructions and Care and Recovery After Surgery   PATIENT INSTRUCTIONS POST-ANESTHESIA  IMMEDIATELY FOLLOWING SURGERY:  Do not drive or operate machinery for the first twenty four hours after surgery.  Do not make any important decisions for twenty four hours after surgery or while taking narcotic pain medications or sedatives.  If you develop intractable nausea and vomiting or a severe headache please notify your doctor immediately.  FOLLOW-UP:  Please make an appointment with your surgeon as instructed. You do not need to follow up with anesthesia unless specifically instructed to do so.  WOUND CARE INSTRUCTIONS (if applicable):  Keep a dry clean dressing on the anesthesia/puncture wound site if there is drainage.  Once the wound has quit draining you may leave it open  to air.  Generally you should leave the bandage intact for twenty four hours unless there is drainage.  If the epidural site drains for more than 36-48 hours please call the anesthesia department.  QUESTIONS?:  Please feel free to call your physician or the hospital operator if you have any questions, and they will be happy to assist you.      Colonoscopy A colonoscopy is an exam to look at the entire large intestine (colon). This exam can help find problems such as tumors, polyps, inflammation, and areas of bleeding. The exam takes about 1 hour.  LET [facility] CARE PROVIDER KNOW ABOUT:   Any allergies you have.  All medicines you are taking, including vitamins, herbs, eye drops, creams, and over-the-counter medicines.  Previous problems you or members of your family have had with the use of anesthetics.  Any blood disorders you have.  Previous surgeries you have had.  Medical conditions you have. RISKS AND COMPLICATIONS  Generally, this is a safe procedure. However, as with any procedure, complications can occur. Possible complications include:  Bleeding.  Tearing or rupture of the colon wall.  Reaction to medicines given during the exam.  Infection (rare). BEFORE THE PROCEDURE  Ask your health care provider about changing or stopping your regular medicines.  You may be prescribed an oral bowel prep. This involves drinking a large amount of medicated liquid, starting the day before your procedure. The liquid will cause you to have multiple loose stools until your stool is almost clear or light green. This cleans out your colon in preparation for the procedure.  Do not eat or drink anything else once you have started the bowel prep, unless your health care provider tells you it is safe to do so.  Arrange for someone to drive you home after the procedure. PROCEDURE   You will be given medicine to help you relax (sedative).  You will lie on your side with your knees  bent.  A long, flexible tube with a light and camera on the end (colonoscope) will be inserted through the rectum and into the colon. The camera sends video back to a computer screen as it moves through the colon. The colonoscope also releases carbon dioxide gas to inflate the colon. This helps your health care provider see the area better.  During the exam, your health care provider may take a small tissue sample (biopsy) to be examined under a microscope if any abnormalities are found.  The exam is finished when the entire colon has been viewed. AFTER THE PROCEDURE   Do not drive for 24 hours after the exam.  You may have a small amount of blood in your stool.  You may pass moderate amounts of gas and have mild abdominal cramping or bloating. This is caused by the gas used to inflate your colon during the exam.  Ask when your test results will be ready and how you will get your results. Make sure you get your test results. Document Released: 04/11/2000 Document Revised: 02/02/2013 Document Reviewed: 12/20/2012 [facility] Patient Information 2015 [place], [place]. This information is not intended to replace advice given to you by your health care provider. Make sure you discuss any questions you have with your health care provider. Esophagogastroduodenoscopy Esophagogastroduodenoscopy (EGD) is a procedure to examine the lining of the esophagus, stomach, and first part of the small intestine (duodenum). A long, flexible, lighted tube with a camera attached (endoscope) is inserted down the throat to view these organs. This procedure is done to detect problems or abnormalities, such as inflammation, bleeding, ulcers, or growths, in order to treat them. The procedure lasts about 5-20 minutes. It is usually an outpatient procedure, but it may need to be performed in emergency cases in the hospital. LET YOUR CAREGIVER KNOW ABOUT:   Allergies to food or medicine.  All medicines you are taking,  including vitamins, herbs, eyedrops, and over-the-counter medicines and creams.  Use of steroids (by mouth or creams).  Previous problems you or members of your family have had with the use of anesthetics.  Any blood disorders you have.  Previous surgeries you have had.  Other health problems you have.  Possibility of pregnancy, if this applies. RISKS AND COMPLICATIONS  Generally, EGD is a safe procedure. However, as with any procedure, complications can occur. Possible complications include:  Infection.  Bleeding.  Tearing (perforation) of the esophagus, stomach, or duodenum.  Difficulty breathing or not being able to breath.  Excessive sweating.  Spasms of the larynx.  Slowed heartbeat.  Low blood pressure. BEFORE THE PROCEDURE  Do not eat or drink anything for 6-8 hours before the procedure or as directed by your caregiver.  Ask your caregiver about changing or  stopping your regular medicines.  If you wear dentures, be prepared to remove them before the procedure.  Arrange for someone to drive you home after the procedure. PROCEDURE   A vein will be accessed to give medicines and fluids. A medicine to relax you (sedative) and a pain reliever will be given through that access into the vein.  A numbing medicine (local anesthetic) may be sprayed on your throat for comfort and to stop you from gagging or coughing.  A mouth guard may be placed in your mouth to protect your teeth and to keep you from biting on the endoscope.  You will be asked to lie on your left side.  The endoscope is inserted down your throat and into the esophagus, stomach, and duodenum.  Air is put through the endoscope to allow your caregiver to view the lining of your esophagus clearly.  The esophagus, stomach, and duodenum is then examined. During the exam, your caregiver may:  Remove tissue to be examined under a microscope (biopsy) for inflammation, infection, or other medical  problems.  Remove growths.  Remove objects (foreign bodies) that are stuck.  Treat any bleeding with medicines or other devices that stop tissues from bleeding (hot cauters, clipping devices).  Widen (dilate) or stretch narrowed areas of the esophagus and stomach.  The endoscope will then be withdrawn. AFTER THE PROCEDURE  You will be taken to a recovery area to be monitored. You will be able to go home once you are stable and alert.  Do not eat or drink anything until the local anesthetic and numbing medicines have worn off. You may choke.  It is normal to feel bloated, have pain with swallowing, or have a sore throat for a short time. This will wear off.  Your caregiver should be able to discuss his or her findings with you. It will take longer to discuss the test results if any biopsies were taken. Document Released: 08/15/2004 Document Revised: 03/31/2012 Document Reviewed: 03/17/2012 [facility] Patient Information 2015 [place], [place]. This information is not intended to replace advice given to you by your health care provider. Make sure you discuss any questions you have with your health care provider.

## 2013-11-04 ENCOUNTER — Encounter (HOSPITAL_COMMUNITY)
Admission: RE | Admit: 2013-11-04 | Discharge: 2013-11-04 | Disposition: A | Discharge status: Home / Self Care | Payer: Medicaid Other | Source: Ambulatory Visit | Attending: Internal Medicine | Admitting: Internal Medicine

## 2013-11-04 ENCOUNTER — Encounter (HOSPITAL_COMMUNITY): Payer: Self-pay

## 2013-11-04 ENCOUNTER — Other Ambulatory Visit (HOSPITAL_COMMUNITY): Payer: Medicaid Other

## 2013-11-04 ENCOUNTER — Other Ambulatory Visit: Payer: Self-pay

## 2013-11-04 DIAGNOSIS — Z01810 Encounter for preprocedural cardiovascular examination: Secondary | ICD-10-CM | POA: Insufficient documentation

## 2013-11-04 DIAGNOSIS — Z01812 Encounter for preprocedural laboratory examination: Secondary | ICD-10-CM | POA: Insufficient documentation

## 2013-11-04 HISTORY — DX: Depression, unspecified: F32.A

## 2013-11-04 HISTORY — DX: Shortness of breath: R06.02

## 2013-11-04 HISTORY — DX: Major depressive disorder, single episode, unspecified: F32.9

## 2013-11-04 LAB — BASIC METABOLIC PANEL
Anion gap: 10 (ref 5–15)
BUN: 14 mg/dL (ref 6–23)
CALCIUM: 8.3 mg/dL — AB (ref 8.4–10.5)
CHLORIDE: 101 meq/L (ref 96–112)
CO2: 22 mEq/L (ref 19–32)
CREATININE: 0.88 mg/dL (ref 0.50–1.35)
Glucose, Bld: 143 mg/dL — ABNORMAL HIGH (ref 70–99)
Potassium: 4.1 mEq/L (ref 3.7–5.3)
Sodium: 133 mEq/L — ABNORMAL LOW (ref 137–147)

## 2013-11-04 LAB — HEMOGLOBIN AND HEMATOCRIT, BLOOD
HEMATOCRIT: 37.3 % — AB (ref 39.0–52.0)
HEMOGLOBIN: 13.3 g/dL (ref 13.0–17.0)

## 2013-11-04 NOTE — Pre-Procedure Instructions (Signed)
Pt. Given info for MyChart. To be set up at home. 

## 2013-11-07 NOTE — Progress Notes (Signed)
Spoke to Dr. [name] concerning patient using cocaine. Reported to him that Dr. Duwayne Heck ordered a drug screen before surgery. Dr. [name] reported that he is in agreement with Dr. Duwayne Heck. Will move forward if approved by Dr. Duwayne Heck.

## 2013-11-10 ENCOUNTER — Telehealth: Payer: Self-pay | Admitting: General Practice

## 2013-11-10 ENCOUNTER — Ambulatory Visit (HOSPITAL_COMMUNITY): Admission: RE | Admit: 2013-11-10 | Payer: Medicaid Other | Source: Ambulatory Visit | Admitting: Internal Medicine

## 2013-11-10 ENCOUNTER — Ambulatory Visit (HOSPITAL_COMMUNITY)
Admission: RE | Admit: 2013-11-10 | Discharge: 2013-11-10 | Disposition: A | Discharge status: Home / Self Care | Payer: Medicaid Other | Source: Ambulatory Visit | Attending: Internal Medicine | Admitting: Internal Medicine

## 2013-11-10 ENCOUNTER — Encounter (HOSPITAL_COMMUNITY)
Admission: RE | Disposition: A | Discharge status: Home / Self Care | Payer: Self-pay | Source: Ambulatory Visit | Attending: Internal Medicine

## 2013-11-10 ENCOUNTER — Encounter (HOSPITAL_COMMUNITY): Admission: RE | Payer: Self-pay | Source: Ambulatory Visit

## 2013-11-10 DIAGNOSIS — K7460 Unspecified cirrhosis of liver: Secondary | ICD-10-CM | POA: Diagnosis not present

## 2013-11-10 DIAGNOSIS — Z860100 Personal history of colon polyps, unspecified: Secondary | ICD-10-CM | POA: Insufficient documentation

## 2013-11-10 DIAGNOSIS — Z538 Procedure and treatment not carried out for other reasons: Secondary | ICD-10-CM | POA: Diagnosis not present

## 2013-11-10 LAB — RAPID URINE DRUG SCREEN, HOSP PERFORMED
Amphetamines: NOT DETECTED
BARBITURATES: NOT DETECTED
Benzodiazepines: NOT DETECTED
Cocaine: POSITIVE — AB
Opiates: NOT DETECTED
Tetrahydrocannabinol: NOT DETECTED

## 2013-11-10 SURGERY — CANCELLED PROCEDURE

## 2013-11-10 SURGERY — COLONOSCOPY WITH PROPOFOL
Anesthesia: Monitor Anesthesia Care

## 2013-11-10 SURGICAL SUPPLY — 27 items
BLOCK BITE 60FR ADLT L/F BLUE (MISCELLANEOUS) IMPLANT
DEVICE CLIP HEMOSTAT 235CM (CLIP) IMPLANT
ELECT REM PT RETURN 9FT ADLT (ELECTROSURGICAL)
ELECTRODE REM PT RTRN 9FT ADLT (ELECTROSURGICAL) IMPLANT
FCP BXJMBJMB 240X2.8X (CUTTING FORCEPS)
FLOOR PAD 36X40 (MISCELLANEOUS)
FORCEPS BIOP RAD 4 LRG CAP 4 (CUTTING FORCEPS) IMPLANT
FORCEPS BIOP RJ4 240 W/NDL (CUTTING FORCEPS)
FORCEPS BXJMBJMB 240X2.8X (CUTTING FORCEPS) IMPLANT
FORMALIN 10 PREFIL 20ML (MISCELLANEOUS) IMPLANT
INJECTOR/SNARE I SNARE (MISCELLANEOUS) IMPLANT
KIT CLEAN ENDO COMPLIANCE (KITS) ×3 IMPLANT
LUBRICANT JELLY 4.5OZ STERILE (MISCELLANEOUS) IMPLANT
MANIFOLD NEPTUNE II (INSTRUMENTS) IMPLANT
NEEDLE SCLEROTHERAPY 25GX240 (NEEDLE) IMPLANT
PAD FLOOR 36X40 (MISCELLANEOUS) IMPLANT
PROBE APC STR FIRE (PROBE) IMPLANT
PROBE INJECTION GOLD (MISCELLANEOUS)
PROBE INJECTION GOLD 7FR (MISCELLANEOUS) IMPLANT
SNARE ROTATE MED OVAL 20MM (MISCELLANEOUS) IMPLANT
SNARE SHORT THROW 13M SML OVAL (MISCELLANEOUS) ×3 IMPLANT
SYR 50ML LL SCALE MARK (SYRINGE) IMPLANT
SYR INFLATION 60ML (SYRINGE) ×3 IMPLANT
TRAP SPECIMEN MUCOUS 40CC (MISCELLANEOUS) IMPLANT
TUBING INSUFFLATOR CO2MPACT (TUBING) ×3 IMPLANT
TUBING IRRIGATION ENDOGATOR (MISCELLANEOUS) IMPLANT
WATER STERILE IRR 1000ML POUR (IV SOLUTION) IMPLANT

## 2013-11-10 NOTE — Telephone Encounter (Signed)
Letter mailed

## 2013-11-10 NOTE — OR Nursing (Signed)
Patient reported that he finished his prep this morning at 5am. Stated he last ate solid food at 11:00 yesterday, at that time he ate chicken and vegetables. .  Stools are brown liquids. Reported to Dr. [name], Orders given. Procedure cancelled and office will contact patient about further instructions.  Dr. Patsey Berthold and Marquita Palms notified of cancellation.

## 2013-11-10 NOTE — Telephone Encounter (Signed)
Message copied by Idamae Schuller on Thu Nov 10, 2013  2:13 PM ------      Message from: Daneil Dolin      Created: Thu Nov 10, 2013  9:34 AM       Patient's drug screen positive for cocaine. Did not followup prep instructions. Not cleaned out.            "We do not have a positive doctor-patient relationship. Using illegal, illicit drugs undermines the doctor-patient relationship".            Please send patient a divorce letter. It Is not productive to keep him in our practice. ------

## 2013-12-05 ENCOUNTER — Ambulatory Visit: Payer: Medicaid Other

## 2013-12-05 VITALS — BP 115/72 | HR 53 | Temp 97.8°F | Resp 14

## 2013-12-05 MED ORDER — ONDANSETRON HCL 4 MG PO TABS: 4.0000 mg | ORAL_TABLET | Freq: Three times a day (TID) | ORAL | Status: DC | PRN

## 2013-12-05 NOTE — Progress Notes (Unsigned)
Patient presents to walk in clinic with c/o nausea. Patient states onset was 2 days ago. Patient denies any vomiting, diarrhea, or constipation. Patient denies being around anyone that's sick. Patient denies chest pain and abdominal. Patient states he is able to eat.  Verbal order received for Zofran 4 mg every hours. Patient informed to return to clinic if symptoms worsen or persist. Vivia Birmingham, RN

## 2013-12-05 NOTE — Patient Instructions (Signed)

## 2014-04-05 ENCOUNTER — Other Ambulatory Visit (INDEPENDENT_AMBULATORY_CARE_PROVIDER_SITE_OTHER): Payer: Self-pay | Admitting: Surgery

## 2014-04-05 NOTE — H&P (Signed)
Jose [name] 04/05/2014 10:03 AM Location: [facility] Patient #: [id number] DOB: 1955-10-28 Undefined / Language: Jose [name] / Race: Black or African American Male History of Present Illness [name] MD; 04/05/2014 11:09 AM) Patient words: RIH.  The patient is a 58 year old male who presents with an inguinal hernia. Patient sent for surgical consultation by Dr. Beulah Gandy with [facility] for concern of right inguinal hernia Pleasant male with history of alcohol and cocaine abuse. Diagnosed with cirrhosis since when presented with ascites last year. Hepatitis C positive. CT scan noted hernia and right groin. He has been more compliant on diuretics. Has difficulty with having even a phone or transportation. Has been to [facility] and in [place] with different doctors. Colonoscopy with polyp removed earlier this year at [facility]. He was concerned about his hernia being worse. Saw a urologist. No testicular mass. Hernia confirmed. Peristalsis noted suspicious for small bowel incarceration. Surgical consultation requested. Patient has never had abdominal surgeries. When she can walk 20 minutes without difficulty. Occasional loose stools. No history of MRSA or skin infections. No major leg swelling. He's not had a have any paracentesis tapping done. Does not think he sees a gastroenterologist regularly. "Divorced" by Dr [name] for +cocaine earlier this year. Sees [facility] & Dr Vista Lawman. Other Problems Jose [name], CMA; 04/05/2014 10:03 AM) Heart murmur High blood pressure Sleep Apnea  Diagnostic Studies History Jose [name], CMA; 04/05/2014 10:03 AM) Colonoscopy 5-10 years ago  Medication History Jose [name], CMA; 04/05/2014 10:04 AM) Furosemide (40MG  Tablet, Oral) Active. Spironolactone (100MG  Tablet, Oral) Active.  Social History Jose [name], [place]; 04/05/2014 10:03 AM) Alcohol use Recently quit alcohol use. Caffeine use Coffee, Tea. Tobacco use  Current some day smoker.  Family History Jose [name], [place]; 04/05/2014 10:03 AM) Alcohol Abuse Brother. Breast Cancer Mother. Heart disease in male family member before age 22 Hypertension Mother.     Review of Systems ([place]; 04/05/2014 10:03 AM) Skin Present- Dryness. Not Present- Change in Wart/Mole, Hives, Jaundice, New Lesions, Non-Healing Wounds, Rash and Ulcer. HEENT Present- Ringing in the Ears. Not Present- Earache, Hearing Loss, Hoarseness, Nose Bleed, Oral Ulcers, Seasonal Allergies, Sinus Pain, Sore Throat, Visual Disturbances, Wears glasses/contact lenses and Yellow Eyes. Cardiovascular Present- Shortness of Breath. Not Present- Chest Pain, Difficulty Breathing Lying Down, Leg Cramps, Palpitations, Rapid Heart Rate and Swelling of Extremities. Male Genitourinary Present- Nocturia. Not Present- Blood in Urine, Change in Urinary Stream, Frequency, Impotence, Painful Urination, Urgency and Urine Leakage.  Vitals (Sonya [name] CMA; 04/05/2014 10:04 AM) 04/05/2014 10:04 AM Weight: 206 lb Height: 66in Body Surface Area: 2.09 m Body Mass Index: 33.25 kg/m Temp.: 86F(Temporal)  Pulse: 79 (Regular)  BP: 138/80 (Sitting, Left Arm, Standard)     Physical Exam [name] MD; 04/05/2014 11:06 AM)  General Mental Status-Alert. General Appearance-Not in acute distress, Not Sickly. Orientation-Oriented X3. Hydration-Well hydrated. Voice-Normal.  Integumentary Global Assessment Upon inspection and palpation of skin surfaces of the - Axillae: non-tender, no inflammation or ulceration, no drainage. and Distribution of scalp and body hair is normal. General Characteristics Temperature - normal warmth is noted.  Head and Neck Head-normocephalic, atraumatic with no lesions or palpable masses. Face Global Assessment - atraumatic, no absence of expression. Neck Global Assessment - no abnormal movements, no bruit auscultated on the right,  no bruit auscultated on the left, no decreased range of motion, non-tender. Trachea-midline. Thyroid Gland Characteristics - non-tender.  Eye Eyeball - Left-Extraocular movements intact, No Nystagmus. Eyeball - Right-Extraocular  movements intact, No Nystagmus. Cornea - Left-No Hazy. Cornea - Right-No Hazy. Sclera/Conjunctiva - Left-No scleral icterus, No Discharge. Sclera/Conjunctiva - Right-No scleral icterus, No Discharge. Pupil - Left-Direct reaction to light normal. Pupil - Right-Direct reaction to light normal.  ENMT Ears Pinna - Left - no drainage observed, no generalized tenderness observed. Right - no drainage observed, no generalized tenderness observed. Nose and Sinuses External Inspection of the Nose - no destructive lesion observed. Inspection of the nares - Left - quiet respiration. Right - quiet respiration. Mouth and Throat Lips - Upper Lip - no fissures observed, no pallor noted. Lower Lip - no fissures observed, no pallor noted. Nasopharynx - no discharge present. Oral Cavity/Oropharynx - Tongue - no dryness observed. Oral Mucosa - no cyanosis observed. Hypopharynx - no evidence of airway distress observed.  Chest and Lung Exam Inspection Movements - Normal and Symmetrical. Accessory muscles - No use of accessory muscles in breathing. Palpation Palpation of the chest reveals - Non-tender. Auscultation Breath sounds - Normal and Clear.  Cardiovascular Auscultation Rhythm - Regular. Murmurs & Other Heart Sounds - Auscultation of the heart reveals - No Murmurs and No Systolic Clicks.  Abdomen Inspection Inspection of the abdomen reveals - No Visible peristalsis and No Abnormal pulsations. Umbilicus - No Bleeding, No Urine drainage. Palpation/Percussion Palpation and Percussion of the abdomen reveal - Soft, Non Tender, No Rebound tenderness, No Rigidity (guarding) and No Cutaneous hyperesthesia. Note: Obese. 3 cm mass reduces to 2 cm umbilical  hernia. Skin thin. No caput medusa   Male Genitourinary Sexual Maturity Tanner 5 - Adult hair pattern and Adult penile size and shape. Note: Normal external male genitalia. Circumcised. Large right inguinal hernia incarcerated to scrotum. Small left hernia prominent with cough.   Peripheral Vascular Upper Extremity Inspection - Left - No Cyanotic nailbeds, Not Ischemic. Right - No Cyanotic nailbeds, Not Ischemic.  Neurologic Neurologic evaluation reveals -normal attention span and ability to concentrate, able to name objects and repeat phrases. Appropriate fund of knowledge , normal sensation and normal coordination. Mental Status Affect - not angry, not paranoid. Cranial Nerves-Normal Bilaterally. Gait-Normal.  Neuropsychiatric Mental status exam performed with findings of-able to articulate well with normal speech/language, rate, volume and coherence, thought content normal with ability to perform basic computations and apply abstract reasoning and no evidence of hallucinations, delusions, obsessions or homicidal/suicidal ideation.  Musculoskeletal Global Assessment Spine, Ribs and Pelvis - no instability, subluxation or laxity. Right Upper Extremity - no instability, subluxation or laxity.  Lymphatic Head & Neck  General Head & Neck Lymphatics: Bilateral - Description - No Localized lymphadenopathy. Axillary  General Axillary Region: Bilateral - Description - No Localized lymphadenopathy. Femoral & Inguinal  Generalized Femoral & Inguinal Lymphatics: Left - Description - No Localized lymphadenopathy. Right - Description - No Localized lymphadenopathy.    Assessment & Plan [name] MD; 04/05/2014 11:01 AM)  REDUCIBLE LEFT INGUINAL HERNIA (550.90  K40.90)  INCARCERATED RIGHT INGUINAL HERNIA (550.10  K40.30) Impression: Patient has 3 hernias. Large right scrotal incarcerated hernia. Reducible definite left inguinal hernia. Moderate size umbilical  hernia.  Standard of care would be to repair the hernias. Given his obesity and cirrhosis and ascites in the past, would need mesh.  His operative risks are markedly increased with his history of cirrhosis. Sounds like he is much more compliant over the past year although he tends to have to see different doctors due to transportation and other issues. No evidence of portal hypertension. No [place]. No history of bleeding.  Offer laparoscopic bilateral hernia repairs with mesh and umbilical hernia repair with mesh. His operative risks are markedly increased. Probably at least an overnight stay. I would definitely want medical clearance first to make sure it is safe. If his operative risks are elevated and his primary care physician is concerned, hold off on surgery.  He has a history of alcohol and cocaine abuse. I noted he needed to be abstinent on alcohol to avoid his cirrhosis being worse & shortening his life expectancy. The consulting Gastroentrologist last year warned him he had a 2 year life expectancy. I think that is better now since he is more compliant on diuretics.  However I did note if he is positive for cocaine, the surgery will be canceled. He does wish to be aggressive and have the hernia repaired as it is painful at times & is bothering him. If cleared medically, we'll offer surgery.  Current Plans Schedule for Surgery Pt Education - [facility] Hernia Post-Op HCI (Kashmir Lysaght): discussed with patient and provided information. Pt Education - [facility] Good Bowel Health (Natalyia Innes) [facility] Consent - Hernia Repair - Ventral/Incisional/Umbilical (Daijah Scrivens) [facility] Consent - Hernia Repair Inguinal/Pelvic (Jonee Lamore): discussed with patient and provided information. UMBILICAL HERNIA WITHOUT OBSTRUCTION AND WITHOUT GANGRENE (553.1  K42.9)

## 2014-05-11 ENCOUNTER — Encounter (HOSPITAL_COMMUNITY): Payer: Self-pay | Admitting: Internal Medicine

## 2015-05-30 MED FILL — SPIRONOLACTONE 100 MG TAB: 100 | 30 days supply | Qty: 30 | Fill #3

## 2015-06-19 MED FILL — TRIAMTERENE-HCTZ 37.5-25 MG: 37.5-25 | 30 days supply | Qty: 30 | Fill #0

## 2015-06-19 MED FILL — FUROSEMIDE 40 MG TABLET: 40 | 15 days supply | Qty: 45 | Fill #0

## 2015-06-26 ENCOUNTER — Inpatient Hospital Stay (HOSPITAL_COMMUNITY)
Admission: EM | Admit: 2015-06-26 | Discharge: 2015-06-29 | DRG: 442 | Disposition: A | Discharge status: Home / Self Care | Payer: Medicaid Other | Attending: Internal Medicine | Admitting: Internal Medicine

## 2015-06-26 ENCOUNTER — Encounter (HOSPITAL_COMMUNITY): Payer: Self-pay | Admitting: Emergency Medicine

## 2015-06-26 ENCOUNTER — Emergency Department (HOSPITAL_COMMUNITY): Payer: Medicaid Other

## 2015-06-26 DIAGNOSIS — K767 Hepatorenal syndrome: Secondary | ICD-10-CM | POA: Diagnosis present

## 2015-06-26 DIAGNOSIS — K4090 Unilateral inguinal hernia, without obstruction or gangrene, not specified as recurrent: Secondary | ICD-10-CM | POA: Diagnosis present

## 2015-06-26 DIAGNOSIS — R4182 Altered mental status, unspecified: Secondary | ICD-10-CM

## 2015-06-26 DIAGNOSIS — N390 Urinary tract infection, site not specified: Secondary | ICD-10-CM | POA: Diagnosis present

## 2015-06-26 DIAGNOSIS — A599 Trichomoniasis, unspecified: Secondary | ICD-10-CM | POA: Diagnosis present

## 2015-06-26 DIAGNOSIS — D6959 Other secondary thrombocytopenia: Secondary | ICD-10-CM | POA: Diagnosis present

## 2015-06-26 DIAGNOSIS — K7682 Hepatic encephalopathy: Secondary | ICD-10-CM | POA: Diagnosis present

## 2015-06-26 DIAGNOSIS — F1290 Cannabis use, unspecified, uncomplicated: Secondary | ICD-10-CM | POA: Diagnosis present

## 2015-06-26 DIAGNOSIS — B9561 Methicillin susceptible Staphylococcus aureus infection as the cause of diseases classified elsewhere: Secondary | ICD-10-CM | POA: Diagnosis present

## 2015-06-26 DIAGNOSIS — K469 Unspecified abdominal hernia without obstruction or gangrene: Secondary | ICD-10-CM | POA: Diagnosis present

## 2015-06-26 DIAGNOSIS — A5903 Trichomonal cystitis and urethritis: Secondary | ICD-10-CM | POA: Diagnosis present

## 2015-06-26 DIAGNOSIS — B1920 Unspecified viral hepatitis C without hepatic coma: Secondary | ICD-10-CM | POA: Diagnosis present

## 2015-06-26 DIAGNOSIS — K7030 Alcoholic cirrhosis of liver without ascites: Secondary | ICD-10-CM | POA: Diagnosis present

## 2015-06-26 DIAGNOSIS — I10 Essential (primary) hypertension: Secondary | ICD-10-CM | POA: Diagnosis present

## 2015-06-26 DIAGNOSIS — K7290 Hepatic failure, unspecified without coma: Principal | ICD-10-CM | POA: Diagnosis present

## 2015-06-26 DIAGNOSIS — K7469 Other cirrhosis of liver: Secondary | ICD-10-CM | POA: Diagnosis not present

## 2015-06-26 DIAGNOSIS — N179 Acute kidney failure, unspecified: Secondary | ICD-10-CM | POA: Diagnosis present

## 2015-06-26 DIAGNOSIS — K7460 Unspecified cirrhosis of liver: Secondary | ICD-10-CM | POA: Diagnosis not present

## 2015-06-26 DIAGNOSIS — F1010 Alcohol abuse, uncomplicated: Secondary | ICD-10-CM | POA: Diagnosis present

## 2015-06-26 DIAGNOSIS — F17210 Nicotine dependence, cigarettes, uncomplicated: Secondary | ICD-10-CM | POA: Diagnosis present

## 2015-06-26 DIAGNOSIS — G9341 Metabolic encephalopathy: Secondary | ICD-10-CM | POA: Diagnosis present

## 2015-06-26 LAB — URINE MICROSCOPIC-ADD ON

## 2015-06-26 LAB — COMPREHENSIVE METABOLIC PANEL
ALK PHOS: 165 U/L — AB (ref 38–126)
ALT: 69 U/L — ABNORMAL HIGH (ref 17–63)
ANION GAP: 10 (ref 5–15)
AST: 148 U/L — ABNORMAL HIGH (ref 15–41)
Albumin: 2.7 g/dL — ABNORMAL LOW (ref 3.5–5.0)
BILIRUBIN TOTAL: 4.2 mg/dL — AB (ref 0.3–1.2)
BUN: 31 mg/dL — ABNORMAL HIGH (ref 6–20)
CALCIUM: 8.9 mg/dL (ref 8.9–10.3)
CO2: 19 mmol/L — ABNORMAL LOW (ref 22–32)
Chloride: 101 mmol/L (ref 101–111)
Creatinine, Ser: 1.76 mg/dL — ABNORMAL HIGH (ref 0.61–1.24)
GFR calc Af Amer: 47 mL/min — ABNORMAL LOW (ref >60)
GFR calc non Af Amer: 41 mL/min — ABNORMAL LOW (ref >60)
Glucose, Bld: 159 mg/dL — ABNORMAL HIGH (ref 65–99)
Potassium: 4.6 mmol/L (ref 3.5–5.1)
SODIUM: 130 mmol/L — AB (ref 135–145)
TOTAL PROTEIN: 8 g/dL (ref 6.5–8.1)

## 2015-06-26 LAB — CBC
HCT: 42.4 % (ref 39.0–52.0)
HEMOGLOBIN: 15.2 g/dL (ref 13.0–17.0)
MCH: 36.1 pg — AB (ref 26.0–34.0)
MCHC: 35.8 g/dL (ref 30.0–36.0)
MCV: 100.7 fL — ABNORMAL HIGH (ref 78.0–100.0)
PLATELETS: 54 10*3/uL — AB (ref 150–400)
RBC: 4.21 MIL/uL — AB (ref 4.22–5.81)
RDW: 13.7 % (ref 11.5–15.5)
WBC: 4.4 10*3/uL (ref 4.0–10.5)

## 2015-06-26 LAB — URINALYSIS, ROUTINE W REFLEX MICROSCOPIC
Glucose, UA: NEGATIVE mg/dL
Ketones, ur: 15 mg/dL — AB
NITRITE: POSITIVE — AB
PROTEIN: NEGATIVE mg/dL
SPECIFIC GRAVITY, URINE: 1.021 (ref 1.005–1.030)
pH: 6 (ref 5.0–8.0)

## 2015-06-26 LAB — CK: CK TOTAL: 745 U/L — AB (ref 49–397)

## 2015-06-26 LAB — RAPID URINE DRUG SCREEN, HOSP PERFORMED
Amphetamines: NOT DETECTED
BENZODIAZEPINES: NOT DETECTED
Barbiturates: NOT DETECTED
Cocaine: NOT DETECTED
Opiates: NOT DETECTED
Tetrahydrocannabinol: NOT DETECTED

## 2015-06-26 LAB — LIPASE, BLOOD: Lipase: 57 U/L — ABNORMAL HIGH (ref 11–51)

## 2015-06-26 LAB — AMMONIA: Ammonia: 135 umol/L — ABNORMAL HIGH (ref 9–35)

## 2015-06-26 LAB — ETHANOL

## 2015-06-26 MED ORDER — ACETAMINOPHEN 325 MG PO TABS: 325.0000 mg | ORAL_TABLET | Freq: Three times a day (TID) | ORAL | Status: DC | PRN

## 2015-06-26 MED ORDER — VITAMIN B-1 100 MG PO TABS
100.0000 mg | ORAL_TABLET | Freq: Every day | ORAL | Status: DC
  Administered 2015-06-27 – 2015-06-29 (×3): 100 mg via ORAL
  Filled 2015-06-26 (×3): qty 1

## 2015-06-26 MED ORDER — ONDANSETRON HCL 4 MG/2ML IJ SOLN: 4.0000 mg | Freq: Four times a day (QID) | INTRAMUSCULAR | Status: DC | PRN

## 2015-06-26 MED ORDER — SODIUM CHLORIDE 0.9 % IV BOLUS (SEPSIS)
1000.0000 mL | Freq: Once | INTRAVENOUS | Status: AC
  Administered 2015-06-26: 1000 mL via INTRAVENOUS

## 2015-06-26 MED ORDER — ONDANSETRON HCL 4 MG/2ML IJ SOLN: 4.0000 mg | Freq: Once | INTRAMUSCULAR | Status: DC

## 2015-06-26 MED ORDER — FUROSEMIDE 40 MG PO TABS
60.0000 mg | ORAL_TABLET | Freq: Two times a day (BID) | ORAL | Status: DC
  Administered 2015-06-27 – 2015-06-29 (×5): 60 mg via ORAL
  Filled 2015-06-26 (×5): qty 1

## 2015-06-26 MED ORDER — LORAZEPAM 1 MG PO TABS: 0.0000 mg | ORAL_TABLET | Freq: Four times a day (QID) | ORAL | Status: AC

## 2015-06-26 MED ORDER — LACTULOSE 10 GM/15ML PO SOLN: 30.0000 g | Freq: Three times a day (TID) | ORAL | Status: DC

## 2015-06-26 MED ORDER — LORAZEPAM 1 MG PO TABS: 0.0000 mg | ORAL_TABLET | Freq: Two times a day (BID) | ORAL | Status: DC

## 2015-06-26 MED ORDER — ACETAMINOPHEN 650 MG RE SUPP: 650.0000 mg | Freq: Four times a day (QID) | RECTAL | Status: DC | PRN

## 2015-06-26 MED ORDER — LORAZEPAM 1 MG PO TABS: 1.0000 mg | ORAL_TABLET | Freq: Four times a day (QID) | ORAL | Status: DC | PRN

## 2015-06-26 MED ORDER — METRONIDAZOLE 500 MG PO TABS
500.0000 mg | ORAL_TABLET | Freq: Two times a day (BID) | ORAL | Status: DC
  Administered 2015-06-27 – 2015-06-29 (×6): 500 mg via ORAL
  Filled 2015-06-26 (×7): qty 1

## 2015-06-26 MED ORDER — THIAMINE HCL 100 MG/ML IJ SOLN: 100.0000 mg | Freq: Every day | INTRAMUSCULAR | Status: DC

## 2015-06-26 MED ORDER — SPIRONOLACTONE 25 MG PO TABS
100.0000 mg | ORAL_TABLET | Freq: Every day | ORAL | Status: DC
  Administered 2015-06-27 – 2015-06-29 (×3): 100 mg via ORAL
  Filled 2015-06-26 (×4): qty 4

## 2015-06-26 MED ORDER — LACTULOSE 10 GM/15ML PO SOLN
30.0000 g | Freq: Three times a day (TID) | ORAL | Status: DC
  Administered 2015-06-27 (×3): 30 g via ORAL
  Filled 2015-06-26 (×3): qty 45

## 2015-06-26 MED ORDER — OXYCODONE HCL 5 MG PO TABS
5.0000 mg | ORAL_TABLET | ORAL | Status: DC | PRN
  Administered 2015-06-28: 5 mg via ORAL
  Filled 2015-06-26: qty 1

## 2015-06-26 MED ORDER — NALOXONE HCL 0.4 MG/ML IJ SOLN
0.2000 mg | Freq: Once | INTRAMUSCULAR | Status: AC
  Administered 2015-06-26: 0.2 mg via INTRAVENOUS
  Filled 2015-06-26: qty 1

## 2015-06-26 MED ORDER — METRONIDAZOLE 500 MG PO TABS: 2000.0000 mg | ORAL_TABLET | Freq: Once | ORAL | Status: DC

## 2015-06-26 MED ORDER — FOLIC ACID 1 MG PO TABS
1.0000 mg | ORAL_TABLET | Freq: Every day | ORAL | Status: DC
  Administered 2015-06-27 – 2015-06-29 (×3): 1 mg via ORAL
  Filled 2015-06-26 (×3): qty 1

## 2015-06-26 MED ORDER — LORAZEPAM 2 MG/ML IJ SOLN: 1.0000 mg | Freq: Four times a day (QID) | INTRAMUSCULAR | Status: DC | PRN

## 2015-06-26 MED ORDER — SODIUM CHLORIDE 0.9 % IV SOLN
INTRAVENOUS | Status: DC
  Administered 2015-06-26 – 2015-06-27 (×2): via INTRAVENOUS

## 2015-06-26 MED ORDER — ONDANSETRON HCL 4 MG PO TABS: 4.0000 mg | ORAL_TABLET | Freq: Four times a day (QID) | ORAL | Status: DC | PRN

## 2015-06-26 MED ORDER — ALUM & MAG HYDROXIDE-SIMETH 200-200-20 MG/5ML PO SUSP
30.0000 mL | Freq: Four times a day (QID) | ORAL | Status: DC | PRN
Start: 2015-06-26 — End: 2015-06-29
  Administered 2015-06-28: 30 mL via ORAL
  Filled 2015-06-26: qty 30

## 2015-06-26 MED ORDER — ADULT MULTIVITAMIN W/MINERALS CH
1.0000 | ORAL_TABLET | Freq: Every day | ORAL | Status: DC
  Administered 2015-06-27 – 2015-06-29 (×3): 1 via ORAL
  Filled 2015-06-26 (×3): qty 1

## 2015-06-26 MED ORDER — HYDROMORPHONE HCL 1 MG/ML IJ SOLN: 0.5000 mg | INTRAMUSCULAR | Status: DC | PRN

## 2015-06-26 MED ORDER — LACTULOSE 10 GM/15ML PO SOLN
30.0000 g | Freq: Once | ORAL | Status: AC
  Administered 2015-06-26: 30 g via ORAL
  Filled 2015-06-26: qty 45

## 2015-06-26 MED ORDER — NALOXONE HCL 0.4 MG/ML IJ SOLN
0.4000 mg | Freq: Once | INTRAMUSCULAR | Status: DC
Start: 2015-06-26 — End: 2015-06-26

## 2015-06-26 NOTE — Progress Notes (Signed)
Received report from [facility] in ED for transfer of pt to [phone].

## 2015-06-26 NOTE — ED Notes (Signed)
Significant swelling noted bilaterally in front of ears. EDP made aware

## 2015-06-26 NOTE — ED Notes (Signed)
Jose [name], accepting nurse made aware that Lactulose was given in ED prior to PT being taken upstairs

## 2015-06-26 NOTE — ED Notes (Signed)
Pt refused catheter, states he is going to try to use urinal.

## 2015-06-26 NOTE — ED Notes (Signed)
PT continues to be lethargic. PT made aware that we need a urine sample. PT reports he can provide one and then goes to sleep. PT awakes to verbal stimuli.

## 2015-06-26 NOTE — H&P (Signed)
[facility] Admission History and Physical       Jose [name] [id number] DOB: 09-25-1955 DOA: 06/26/2015  Referring physician: EDP PCP: Carmie Kanner, NP  Specialists:   Chief Complaint: Confusion  HPI: Jose [name] is a 60 y.o. male with a history of Cirrhosis, Hep C, HTN, Alcohol Abuse who presents to the ED with complaints of increased confusion, and lethargy over the past 2 days.    He also reports having body aches.   He denies any fevers or chills or nausea, vomiting or diarrhea. He reports his last drink of alcohol was 2 days ago.    He was evaluated in the ED and found to have an Ammonia level of 135.   He was administered a dose of PO Lactulose 30 grams in the ED and referred for admission.       Review of Systems: Unable to Obtain from the Patient  Past Medical History  Diagnosis Date  . Hypertension   . Hepatitis C   . Alcohol abuse   . Ascites   . Cirrhosis ([place])   . Shortness of breath     with exertion  . Depression      Past Surgical History  Procedure Laterality Date  . Colonoscopy  march 2015    Dr. Renee Harder: nodular mucosa at appendiceal orifice, tubular adenoma, extremely poor prep  . Circumcision        Prior to Admission medications   Medication Sig Start Date End Date Taking? Authorizing Provider  furosemide (LASIX) 40 MG tablet Take 1.5 tablets (60 mg total) by mouth 2 (two) times daily. 10/13/13  Yes Lance Bosch, NP  spironolactone (ALDACTONE) 100 MG tablet Take 1 tablet (100 mg total) by mouth daily. 10/13/13  Yes Lance Bosch, NP  triamterene-hydrochlorothiazide (MAXZIDE-25) 37.5-25 MG tablet Take 1 tablet by mouth daily at 12 noon. 06/19/15  Yes Historical Provider, MD  Cholecalciferol (VITAMIN D) 1000 UNITS capsule Take 1 capsule (1,000 Units total) by mouth daily. Patient not taking: Reported on 06/26/2015 10/13/13   Lance Bosch, NP  ondansetron (ZOFRAN) 4 MG tablet Take 1 tablet (4 mg total) by mouth every 8 (eight) hours  as needed for nausea or vomiting. Patient not taking: Reported on 06/26/2015 12/05/13   Lorayne Marek, MD     No Known Allergies  Social History:  reports that he has been smoking Cigarettes.  He has been smoking about 0.50 packs per day. He has never used smokeless tobacco. He reports that he drinks alcohol. He reports that he uses illicit drugs (Marijuana and Cocaine).    Family History  Problem Relation Age of Onset  . Breast cancer Mother   . Hypertension Mother   . Diabetes Father   . Hypertension Sister   . Heart disease Sister   . Hypertension Paternal Grandmother   . Colon cancer Neg Hx        Physical Exam:  GEN: Obtunded  Well Nourished and Well Developed  60 y.o. African American male examined and in no acute distress; cooperative with exam Filed Vitals:   06/26/15 1930 06/26/15 2015 06/26/15 2105 06/26/15 2200  BP: 125/76 125/74 130/81 121/73  Pulse: 62 63 56 76  Temp:      TempSrc:      Resp: 18 18 21 21   Height:      Weight:      SpO2: 96% 96% 100% 100%   Blood pressure 121/73, pulse 76, temperature 98.3 F (36.8 C), temperature source  Oral, resp. rate 21, height 5\' 6"  (1.676 m), weight 81.647 kg (180 lb), SpO2 100 %. PSYCH: He is alert and oriented x 2; Obtunded HEENT: Normocephalic and Atraumatic, Mucous membranes pink; PERRLA; EOM intact; Fundi:  Benign;  No scleral icterus, Nares: Patent, Oropharynx: Clear, Edentulous or Fair Dentition,    Neck:  FROM, No Cervical Lymphadenopathy nor Thyromegaly or Carotid Bruit; No JVD; Breasts:: Not examined CHEST WALL: No tenderness CHEST: Normal respiration, clear to auscultation bilaterally HEART: Regular rate and rhythm; no murmurs rubs or gallops BACK: No kyphosis or scoliosis; No CVA tenderness ABDOMEN: Positive Bowel Sounds, Soft Non-Tender, No Rebound or Guarding; No Masses, No Organomegaly, No Pannus; No Intertriginous candida. Rectal Exam: Not done EXTREMITIES: No Cyanosis, Clubbing, or Edema; No  Ulcerations. Genitalia: not examined PULSES: 2+ and symmetric SKIN: Normal hydration no rash or ulceration CNS:  Alert and Oriented x 2, No Focal Deficits, +Asterixis Vascular: pulses palpable throughout    Labs on Admission:  Basic Metabolic Panel:  Recent Labs Lab 06/26/15 1438  NA 130*  K 4.6  CL 101  CO2 19*  GLUCOSE 159*  BUN 31*  CREATININE 1.76*  CALCIUM 8.9   Liver Function Tests:  Recent Labs Lab 06/26/15 1438  AST 148*  ALT 69*  ALKPHOS 165*  BILITOT 4.2*  PROT 8.0  ALBUMIN 2.7*    Recent Labs Lab 06/26/15 1438  LIPASE 57*    Recent Labs Lab 06/26/15 2124  AMMONIA 135*   CBC:  Recent Labs Lab 06/26/15 1438  WBC 4.4  HGB 15.2  HCT 42.4  MCV 100.7*  PLT 54*   Cardiac Enzymes:  Recent Labs Lab 06/26/15 1954  CKTOTAL 745*    BNP (last 3 results) No results for input(s): BNP in the last 8760 hours.  ProBNP (last 3 results) No results for input(s): PROBNP in the last 8760 hours.  CBG: No results for input(s): GLUCAP in the last 168 hours.  Radiological Exams on Admission: Dg Chest 2 View  06/26/2015  CLINICAL DATA:  Confusion. History of hypertension hepatitis-C and alcohol abuse. EXAM: CHEST  2 VIEW COMPARISON:  None. FINDINGS: The heart size and mediastinal contours are within normal limits. Both lungs are clear. The visualized skeletal structures are unremarkable. IMPRESSION: No active cardiopulmonary disease. Electronically Signed   By: Kerby Moors M.D.   On: 06/26/2015 18:41     EKG: Independently reviewed.    Assessment/Plan:   60 y.o. 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

## 2015-06-26 NOTE — ED Notes (Signed)
ED PA at bedside

## 2015-06-26 NOTE — ED Notes (Signed)
Lab to add on CK test

## 2015-06-26 NOTE — ED Notes (Signed)
Report accepted by Antoine Poche, RN

## 2015-06-26 NOTE — ED Notes (Addendum)
Pt states for the last two days he has just had body aches all over with a non-productive cough pt also states he has a inguinal hernia that has been there for 1 year that is "bothering him. "

## 2015-06-26 NOTE — ED Notes (Signed)
No immediate response to  Narcan. PT sleeping. Arouses to voice.

## 2015-06-26 NOTE — ED Provider Notes (Signed)
CSN: [id number]     Arrival date & time 06/26/15  1423 History   First MD Initiated Contact with Patient 06/26/15 1654     Chief Complaint  Patient presents with  . Generalized Body Aches   HPI   Jose [name] is a 60 y.o. male PMH significant for hypertension, hepatitis C, alcohol abuse, cirrhosis, depression presenting with a 2 day history of body aches. He endorses cough. He denies fevers, chills, localized chest pain, shortness of breath, localized abdominal pain, N/V, changes in bowel/bladder habits. He is lethargic during the interview and states he "worked a lot yesterday."   Past Medical History  Diagnosis Date  . Hypertension   . Hepatitis C   . Alcohol abuse   . Ascites   . Cirrhosis ([place])   . Shortness of breath     with exertion  . Depression    Past Surgical History  Procedure Laterality Date  . Colonoscopy  march 2015    Dr. Renee Harder: nodular mucosa at appendiceal orifice, tubular adenoma, extremely poor prep  . Circumcision     Family History  Problem Relation Age of Onset  . Breast cancer Mother   . Hypertension Mother   . Diabetes Father   . Hypertension Sister   . Heart disease Sister   . Hypertension Paternal Grandmother   . Colon cancer Neg Hx    Social History  Substance Use Topics  . Smoking status: Light Tobacco Smoker -- 0.50 packs/day    Types: Cigarettes  . Smokeless tobacco: Never Used     Comment: STATES HE SMOKES 2-3 TIMES MONTHLY  . Alcohol Use: Yes     Comment: A couple beers every few days     Review of Systems  Ten systems are reviewed and are negative for acute change except as noted in the HPI  Allergies  Review of patient's allergies indicates no known allergies.  Home Medications   Prior to Admission medications   Medication Sig Start Date End Date Taking? Authorizing Provider  furosemide (LASIX) 40 MG tablet Take 1.5 tablets (60 mg total) by mouth 2 (two) times daily. 10/13/13  Yes Lance Bosch, NP  spironolactone  (ALDACTONE) 100 MG tablet Take 1 tablet (100 mg total) by mouth daily. 10/13/13  Yes Lance Bosch, NP  triamterene-hydrochlorothiazide (MAXZIDE-25) 37.5-25 MG tablet Take 1 tablet by mouth daily at 12 noon. 06/19/15  Yes Historical Provider, MD  Cholecalciferol (VITAMIN D) 1000 UNITS capsule Take 1 capsule (1,000 Units total) by mouth daily. Patient not taking: Reported on 06/26/2015 10/13/13   Lance Bosch, NP  ondansetron (ZOFRAN) 4 MG tablet Take 1 tablet (4 mg total) by mouth every 8 (eight) hours as needed for nausea or vomiting. Patient not taking: Reported on 06/26/2015 12/05/13   Lorayne Marek, MD   BP 125/76 mmHg  Pulse 62  Temp(Src) 98.3 F (36.8 C) (Oral)  Resp 18  Ht 5\' 6"  (1.676 m)  Wt 81.647 kg  BMI 29.07 kg/m2  SpO2 96% Physical Exam  Constitutional: He appears well-developed and well-nourished. No distress.  HENT:  Head: Normocephalic and atraumatic.  Oropharynx dry. BL enlarged parotid glands.   Eyes: Conjunctivae are normal. Pupils are equal, round, and reactive to light. Right eye exhibits no discharge. Left eye exhibits no discharge. Scleral icterus is present.  Neck: No tracheal deviation present.  Cardiovascular: Normal rate, regular rhythm, normal heart sounds and intact distal pulses.  Exam reveals no gallop and no friction rub.   No  murmur heard. Pulmonary/Chest: Effort normal and breath sounds normal. No respiratory distress. He has no wheezes. He has no rales. He exhibits no tenderness.  Abdominal: Soft. Bowel sounds are normal. He exhibits no distension and no mass. There is no tenderness. There is no rebound and no guarding.  Musculoskeletal: He exhibits no edema.  Lymphadenopathy:    He has cervical adenopathy.  Neurological: He is alert.  Positive asterixis BL   Skin: Skin is warm and dry. No rash noted. He is not diaphoretic. No erythema.  Psychiatric:  Lethargic, arouses to voice  Nursing note and vitals reviewed.   ED Course  Procedures  Labs  Review Labs Reviewed  LIPASE, BLOOD - Abnormal; Notable for the following:    Lipase 57 (*)    All other components within normal limits  COMPREHENSIVE METABOLIC PANEL - Abnormal; Notable for the following:    Sodium 130 (*)    CO2 19 (*)    Glucose, Bld 159 (*)    BUN 31 (*)    Creatinine, Ser 1.76 (*)    Albumin 2.7 (*)    AST 148 (*)    ALT 69 (*)    Alkaline Phosphatase 165 (*)    Total Bilirubin 4.2 (*)    GFR calc non Af Amer 41 (*)    GFR calc Af Amer 47 (*)    All other components within normal limits  CBC - Abnormal; Notable for the following:    RBC 4.21 (*)    MCV 100.7 (*)    MCH 36.1 (*)    Platelets 54 (*)    All other components within normal limits  CK - Abnormal; Notable for the following:    Total CK 745 (*)    All other components within normal limits  URINALYSIS, ROUTINE W REFLEX MICROSCOPIC (NOT AT [facility])   Imaging Review Dg Chest 2 View  06/26/2015  CLINICAL DATA:  Confusion. History of hypertension hepatitis-C and alcohol abuse. EXAM: CHEST  2 VIEW COMPARISON:  None. FINDINGS: The heart size and mediastinal contours are within normal limits. Both lungs are clear. The visualized skeletal structures are unremarkable. IMPRESSION: No active cardiopulmonary disease. Electronically Signed   By: Kerby Moors M.D.   On: 06/26/2015 18:41   I have personally reviewed and evaluated these images and lab results as part of my medical decision-making.  MDM   Final diagnoses:  Altered mental status, unspecified altered mental status type  Metabolic encephalopathy  Trichomonal urethritis in male   Patient lethargic. Patient not responding to narcan.  Will perform broad workup for infectious vs metabolic etiologies. URI/influenza likely as well.  CXR, CBC unremarkable.  CMP with elevated LFTs (AST 148, ALT 69), 4.2 bilirubin, SCr 1.76, hyperglycemia of 159. Lipase 57. Patient will need admission for lethargy, CK 745, AKI of 1.76.  Dr. Arnoldo Morale advising ammonia  level and influenza test prior to admission evaluation.  UA remarkable for nitrite positive with few bacteria. Will hold off on UTI treatment, as WBCs are most likely secondary pyruria resulting from trichomonas. Urine culture sent. Will hold off on trich treatment per Dr. Arnoldo Morale. Patient may be safely admitted at this time. He is in understanding and agreement with the plan.    Steele Lions, PA-C 06/26/15 [place], DO 06/27/15 [id number]

## 2015-06-27 DIAGNOSIS — K767 Hepatorenal syndrome: Secondary | ICD-10-CM | POA: Diagnosis present

## 2015-06-27 DIAGNOSIS — N3000 Acute cystitis without hematuria: Secondary | ICD-10-CM

## 2015-06-27 DIAGNOSIS — K7460 Unspecified cirrhosis of liver: Secondary | ICD-10-CM

## 2015-06-27 LAB — INFLUENZA PANEL BY PCR (TYPE A & B)
H1N1FLUPCR: NOT DETECTED
Influenza A By PCR: NEGATIVE
Influenza B By PCR: NEGATIVE

## 2015-06-27 LAB — RPR: RPR: NONREACTIVE

## 2015-06-27 LAB — CBC
HCT: 38.1 % — ABNORMAL LOW (ref 39.0–52.0)
Hemoglobin: 13.3 g/dL (ref 13.0–17.0)
MCH: 36 pg — AB (ref 26.0–34.0)
MCHC: 34.9 g/dL (ref 30.0–36.0)
MCV: 103.3 fL — ABNORMAL HIGH (ref 78.0–100.0)
PLATELETS: 44 10*3/uL — AB (ref 150–400)
RBC: 3.69 MIL/uL — AB (ref 4.22–5.81)
RDW: 14 % (ref 11.5–15.5)
WBC: 3.5 10*3/uL — AB (ref 4.0–10.5)

## 2015-06-27 LAB — BASIC METABOLIC PANEL
Anion gap: 5 (ref 5–15)
BUN: 24 mg/dL — AB (ref 6–20)
CALCIUM: 8.4 mg/dL — AB (ref 8.9–10.3)
CO2: 21 mmol/L — ABNORMAL LOW (ref 22–32)
CREATININE: 1.25 mg/dL — AB (ref 0.61–1.24)
Chloride: 106 mmol/L (ref 101–111)
GFR calc non Af Amer: >60 mL/min (ref >60)
Glucose, Bld: 105 mg/dL — ABNORMAL HIGH (ref 65–99)
Potassium: 4.3 mmol/L (ref 3.5–5.1)
SODIUM: 132 mmol/L — AB (ref 135–145)

## 2015-06-27 LAB — HIV ANTIBODY (ROUTINE TESTING W REFLEX): HIV Screen 4th Generation wRfx: NONREACTIVE

## 2015-06-27 MED ORDER — RIFAXIMIN 550 MG PO TABS
550.0000 mg | ORAL_TABLET | Freq: Two times a day (BID) | ORAL | Status: DC
  Administered 2015-06-27 – 2015-06-29 (×4): 550 mg via ORAL
  Filled 2015-06-27 (×4): qty 1

## 2015-06-27 MED ORDER — LACTULOSE 10 GM/15ML PO SOLN
30.0000 g | Freq: Three times a day (TID) | ORAL | Status: DC
  Administered 2015-06-28: 30 g via ORAL
  Filled 2015-06-27: qty 45

## 2015-06-27 MED ORDER — DEXTROSE 5 % IV SOLN
1.0000 g | INTRAVENOUS | Status: DC
  Administered 2015-06-27 – 2015-06-28 (×2): 1 g via INTRAVENOUS
  Filled 2015-06-27 (×2): qty 10

## 2015-06-27 NOTE — Progress Notes (Signed)
UR COMPLETED  

## 2015-06-27 NOTE — Care Management Note (Signed)
Case Management Note  Patient Details  Name: Jose [name] MRN: [id number] Date of Birth: 11/08/1955  Subjective/Objective:                 Admitted with AMS/confusion. History of Cirrhosis, Hep C, HTN, Alcohol Abuse. From a group home( [name] ) @ [phone].   Action/Plan: Return to home when medically stable.CM TO F/U WITH DISPOSITION NEEDS.  Expected Discharge Date:                  Expected Discharge Plan:  Home/Self Care  In-House Referral:   CSW  Discharge planning Services  CM Consult  Post Acute Care Choice:    Choice offered to:     DME Arranged:    DME Agency:     HH Arranged:    HH Agency:     Status of Service:  In process, will continue to follow  Medicare Important Message Given:    Date Medicare IM Given:    Medicare IM give by:    Date Additional Medicare IM Given:    Additional Medicare Important Message give by:     If discussed at [place] of Stay Meetings, dates discussed:    Additional Comments:  Sharin Mons, [place] [phone] 06/27/2015, 4:18 PM

## 2015-06-27 NOTE — Progress Notes (Signed)
[facility] PROGRESS NOTE  Jose [name] [id number] DOB: [birth year] DOA: 06/26/2015 PCP: Carmie Kanner, NP  Assessment/Plan: #1 hepatic encephalopathy Likely secondary to medication noncompliance. Some clinical improvement. Continue current dose of lactulose. Continue Aldactone. Continue Lasix. Will add Xifaxan. Outpatient follow-up.  #2 UTI/Trichomonas in the urine Urine cultures pending. Continue Flagyl. Will place on IV Rocephin.  #3 acute renal failure Improvement. Follow.  #4 hypertension Continue Lasix and Aldactone.  #5 cirrhosis secondary to alcohol abuse and hepatitis C Continue diuretics of Lasix and Aldactone. Continue lactulose. Place on Xifaxan. Follow.  #6 history of alcohol abuse Continue the Ativan withdrawal protocol.  #7 thrombocytopenia Likely secondary to problem #5 and alcohol abuse. No signs of bleeding. Follow.  #8 hernia Patient with complaints of hernia bothering him intermittently. Patient will need to follow-up with general surgery for further evaluation as outpatient.  Code Status: Full. Family Communication: Updated patient. No family present. Disposition Plan: Home when medically stable.   Consultants:  None  Procedures:  Chest x-ray 06/26/2015  Antibiotics:  IV Rocephin 06/27/2015  Oral Flagyl 06/26/2015  HPI/Subjective: Patient complaining of a hernia which he states has been ongoing for a while and usually bothers him intermittently. 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 No chest pain. No shortness of breath.  Objective: Filed Vitals:   06/26/15 2347 06/27/15 1107  BP: 146/79 131/65  Pulse: 59 58  Temp:  98.5 F (36.9 C)  Resp: 20 18    Intake/Output Summary (Last 24 hours) at 06/27/15 1609 Last data filed at 06/27/15 1506  Gross per 24 hour  Intake      0 ml  Output    401 ml  Net   -401 ml   Filed Weights   06/26/15 1432 06/27/15 0000  Weight: 81.647 kg (180 lb) 78.7 kg (173 lb 8 oz)     Exam:   General:  NAD  Cardiovascular: RRR  Respiratory: CTAB  Abdomen: Soft, nontender, nondistended, positive bowel sounds.  Musculoskeletal: No clubbing cyanosis or edema.  Data Reviewed: Basic Metabolic Panel:  Recent Labs Lab 06/26/15 1438 06/27/15 0733  NA 130* 132*  K 4.6 4.3  CL 101 106  CO2 19* 21*  GLUCOSE 159* 105*  BUN 31* 24*  CREATININE 1.76* 1.25*  CALCIUM 8.9 8.4*   Liver Function Tests:  Recent Labs Lab 06/26/15 1438  AST 148*  ALT 69*  ALKPHOS 165*  BILITOT 4.2*  PROT 8.0  ALBUMIN 2.7*    Recent Labs Lab 06/26/15 1438  LIPASE 57*    Recent Labs Lab 06/26/15 2124  AMMONIA 135*   CBC:  Recent Labs Lab 06/26/15 1438 06/27/15 0733  WBC 4.4 3.5*  HGB 15.2 13.3  HCT 42.4 38.1*  MCV 100.7* 103.3*  PLT 54* 44*   Cardiac Enzymes:  Recent Labs Lab 06/26/15 1954  CKTOTAL 745*   BNP (last 3 results) No results for input(s): BNP in the last 8760 hours.  ProBNP (last 3 results) No results for input(s): PROBNP in the last 8760 hours.  CBG: No results for input(s): GLUCAP in the last 168 hours.  No results found for this or any previous visit (from the past 240 hour(s)).   Studies: Dg Chest 2 View  06/26/2015  CLINICAL DATA:  Confusion. History of hypertension hepatitis-C and alcohol abuse. EXAM: CHEST  2 VIEW COMPARISON:  None. FINDINGS: The heart size and mediastinal contours are within normal limits. Both lungs are clear. The visualized skeletal structures are unremarkable. IMPRESSION: No active cardiopulmonary  disease. Electronically Signed   By: Kerby Moors M.D.   On: 06/26/2015 18:41    Scheduled Meds: . cefTRIAXone (ROCEPHIN)  IV  1 g Intravenous Q24H  . folic acid  1 mg Oral Daily  . furosemide  60 mg Oral BID  . lactulose  30 g Oral TID  . LORazepam  0-4 mg Oral Q6H   Followed by  . [START ON 06/28/2015] LORazepam  0-4 mg Oral Q12H  . metroNIDAZOLE  500 mg Oral Q12H  . multivitamin with minerals  1  tablet Oral Daily  . spironolactone  100 mg Oral Daily  . thiamine  100 mg Oral Daily   Continuous Infusions: . sodium chloride 50 mL/hr at 06/26/15 2353    Principal Problem:   Hepatic encephalopathy (HCC) Active Problems:   UTI (urinary tract infection)   Hypertension   Cirrhosis (HCC)   Metabolic encephalopathy   Alcohol abuse   Trichomonas infection   AKI (acute kidney injury) ([place])   ARF (acute renal failure) ([place])    Time spent: 11 minutes    Keleigh Kazee M.D. [facility] Pager [phone]. If 7PM-7AM, please contact night-coverage at [email or web address], password [facility] 06/27/2015, 4:09 PM  LOS: 1 day

## 2015-06-28 ENCOUNTER — Encounter: Payer: Self-pay | Admitting: Hematology and Oncology

## 2015-06-28 DIAGNOSIS — K7290 Hepatic failure, unspecified without coma: Principal | ICD-10-CM

## 2015-06-28 LAB — COMPREHENSIVE METABOLIC PANEL
ALK PHOS: 130 U/L — AB (ref 38–126)
ALT: 58 U/L (ref 17–63)
AST: 114 U/L — AB (ref 15–41)
Albumin: 2.2 g/dL — ABNORMAL LOW (ref 3.5–5.0)
Anion gap: 10 (ref 5–15)
BILIRUBIN TOTAL: 2.3 mg/dL — AB (ref 0.3–1.2)
BUN: 18 mg/dL (ref 6–20)
CO2: 21 mmol/L — ABNORMAL LOW (ref 22–32)
CREATININE: 1.14 mg/dL (ref 0.61–1.24)
Calcium: 8.4 mg/dL — ABNORMAL LOW (ref 8.9–10.3)
Chloride: 99 mmol/L — ABNORMAL LOW (ref 101–111)
GFR calc Af Amer: >60 mL/min (ref >60)
Glucose, Bld: 111 mg/dL — ABNORMAL HIGH (ref 65–99)
Potassium: 4.3 mmol/L (ref 3.5–5.1)
Sodium: 130 mmol/L — ABNORMAL LOW (ref 135–145)
TOTAL PROTEIN: 6.3 g/dL — AB (ref 6.5–8.1)

## 2015-06-28 LAB — CBC
HCT: 37.5 % — ABNORMAL LOW (ref 39.0–52.0)
Hemoglobin: 13.6 g/dL (ref 13.0–17.0)
MCH: 37.6 pg — ABNORMAL HIGH (ref 26.0–34.0)
MCHC: 36.3 g/dL — ABNORMAL HIGH (ref 30.0–36.0)
MCV: 103.6 fL — ABNORMAL HIGH (ref 78.0–100.0)
PLATELETS: 37 10*3/uL — AB (ref 150–400)
RBC: 3.62 MIL/uL — ABNORMAL LOW (ref 4.22–5.81)
RDW: 13.9 % (ref 11.5–15.5)
WBC: 4.3 10*3/uL (ref 4.0–10.5)

## 2015-06-28 LAB — GC/CHLAMYDIA PROBE AMP (~~LOC~~) NOT AT ARMC
Chlamydia: NEGATIVE
Neisseria Gonorrhea: NEGATIVE

## 2015-06-28 LAB — MAGNESIUM: Magnesium: 1.6 mg/dL — ABNORMAL LOW (ref 1.7–2.4)

## 2015-06-28 LAB — AMMONIA: Ammonia: 32 umol/L (ref 9–35)

## 2015-06-28 MED ORDER — ALUM & MAG HYDROXIDE-SIMETH 200-200-20 MG/5ML PO SUSP: 30.0000 mL | Freq: Four times a day (QID) | ORAL | Status: DC | PRN

## 2015-06-28 MED ORDER — VANCOMYCIN HCL IN DEXTROSE 750-5 MG/150ML-% IV SOLN
750.0000 mg | Freq: Two times a day (BID) | INTRAVENOUS | Status: DC
  Administered 2015-06-28 – 2015-06-29 (×2): 750 mg via INTRAVENOUS
  Filled 2015-06-28 (×3): qty 150

## 2015-06-28 MED ORDER — PNEUMOCOCCAL VAC POLYVALENT 25 MCG/0.5ML IJ INJ
0.5000 mL | INJECTION | INTRAMUSCULAR | Status: DC
  Filled 2015-06-28: qty 1
  Filled 2015-06-28: qty 0.5

## 2015-06-28 MED ORDER — LACTULOSE 10 GM/15ML PO SOLN
30.0000 g | Freq: Two times a day (BID) | ORAL | Status: DC
  Administered 2015-06-28 – 2015-06-29 (×2): 30 g via ORAL
  Filled 2015-06-28 (×2): qty 45

## 2015-06-28 NOTE — Progress Notes (Signed)
ANTIBIOTIC CONSULT NOTE - INITIAL  Pharmacy Consult for vancomycin Indication: UTI  No Known Allergies  Patient Measurements: Height: 5\' 6"  (167.6 cm) Weight: 173 lb 8 oz (78.7 kg) IBW/kg (Calculated) : 63.8 Adjusted Body Weight:   Vital Signs: Temp: 97.8 F (36.6 C) (03/02 1407) Temp Source: Oral (03/02 1407) BP: 133/81 mmHg (03/02 1407) Pulse Rate: 55 (03/02 1407) Intake/Output from previous day: 03/01 0701 - 03/02 0700 In: 1175 [I.V.:1175] Out: 1160 [Urine:1160] Intake/Output from this shift: Total I/O In: -  Out: 250 [Urine:250]  Labs:  Recent Labs  06/26/15 1438 06/27/15 0733 06/28/15 0631  WBC 4.4 3.5* 4.3  HGB 15.2 13.3 13.6  PLT 54* 44* 37*  CREATININE 1.76* 1.25* 1.14   Estimated Creatinine Clearance: 68.9 mL/min (by C-G formula based on Cr of 1.14). No results for input(s): VANCOTROUGH, VANCOPEAK, VANCORANDOM, GENTTROUGH, GENTPEAK, GENTRANDOM, TOBRATROUGH, TOBRAPEAK, TOBRARND, AMIKACINPEAK, AMIKACINTROU, AMIKACIN in the last 72 hours.   Microbiology: Recent Results (from the past 720 hour(s))  Urine culture     Status: None (Preliminary result)   Collection Time: 06/26/15  8:09 PM  Result Value Ref Range Status   Specimen Description URINE, RANDOM  Final   Special Requests NONE  Final   Culture >=100,000 COLONIES/mL STAPHYLOCOCCUS AUREUS  Final   Report Status PENDING  Incomplete    Medical History: Past Medical History  Diagnosis Date  . Hypertension   . Hepatitis C   . Alcohol abuse   . Ascites   . Cirrhosis ([place])   . Shortness of breath     with exertion  . Depression     Medications:  Scheduled:  . folic acid  1 mg Oral Daily  . furosemide  60 mg Oral BID  . lactulose  30 g Oral BID  . LORazepam  0-4 mg Oral Q6H   Followed by  . LORazepam  0-4 mg Oral Q12H  . metroNIDAZOLE  500 mg Oral Q12H  . multivitamin with minerals  1 tablet Oral Daily  . [START ON 06/29/2015] pneumococcal 23 valent vaccine  0.5 mL Intramuscular  Tomorrow-1000  . rifaximin  550 mg Oral BID  . spironolactone  100 mg Oral Daily  . thiamine  100 mg Oral Daily   Infusions:   Assessment: 60 yo male with staph UTI will be started on vancomycin.  CrCl ~69.  Goal of Therapy:  Vancomycin trough level 10-15 mcg/ml  Plan:  - vancomycin 750 mg iv q12h - monitor renal function and check vancomycin trough when it's appropriate - f/u on sensitivity   Grayson White, [place] 06/28/2015,4:29 PM

## 2015-06-28 NOTE — Progress Notes (Signed)
[facility] PROGRESS NOTE  Jose [name] [id number] DOB: [birth year] DOA: 06/26/2015 PCP: Carmie Kanner, NP  Assessment/Plan: #1 hepatic encephalopathy Clinically improved and NH3 now normal. Having abdominal cramps. Change lactulose to bid  #2 UTI/ cx >100K staph aureus. Sensitivity pending. Change to vancomycin pending sensitivity.  Trichomonas: on flagyl. Not sexually active for >1 year.  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   #6 history of alcohol abuse No DTs. Consult SW  #7 thrombocytopenia Secondary to cirrhosis/EtOH. At baseline. No bleeding  #8 hernia F/u surgery as outpatient  Dispo: pt requests SW consult. Lives in group home and interested in "public housing"  Code Status: Full. Family Communication: Updated patient. No family present. Disposition Plan: discharge when urine cx complete   Consultants:  None  Procedures:  Chest x-ray 06/26/2015  Antibiotics:  IV Rocephin 06/27/2015  Oral Flagyl 06/26/2015  HPI/Subjective: Per RN, c/o stomach cramps early. Not sexually active. Denies dysuria currently.   Objective: Filed Vitals:   06/28/15 0633 06/28/15 1407  BP: 120/76 133/81  Pulse: 61 55  Temp: 98.4 F (36.9 C) 97.8 F (36.6 C)  Resp: 18 18    Intake/Output Summary (Last 24 hours) at 06/28/15 1630 Last data filed at 06/28/15 1300  Gross per 24 hour  Intake   1175 ml  Output   1110 ml  Net     65 ml   Filed Weights   06/26/15 1432 06/27/15 0000  Weight: 81.647 kg (180 lb) 78.7 kg (173 lb 8 oz)    Exam:   General:  A and o. cooperative  Cardiovascular: RRR without MGR  Respiratory: CTAB without WRR  Abdomen: Soft, nontender, nondistended, positive bowel sounds.  Musculoskeletal: No clubbing cyanosis or edema.  Neuro: no asterixis  Data Reviewed: Basic Metabolic Panel:  Recent Labs Lab 06/26/15 1438  06/27/15 0733 06/28/15 0631  NA 130* 132* 130*  K 4.6 4.3 4.3  CL 101 106 99*  CO2 19* 21* 21*  GLUCOSE 159* 105* 111*  BUN 31* 24* 18  CREATININE 1.76* 1.25* 1.14  CALCIUM 8.9 8.4* 8.4*  MG  --   --  1.6*   Liver Function Tests:  Recent Labs Lab 06/26/15 1438 06/28/15 0631  AST 148* 114*  ALT 69* 58  ALKPHOS 165* 130*  BILITOT 4.2* 2.3*  PROT 8.0 6.3*  ALBUMIN 2.7* 2.2*    Recent Labs Lab 06/26/15 1438  LIPASE 57*    Recent Labs Lab 06/26/15 2124 06/28/15 0631  AMMONIA 135* 32   CBC:  Recent Labs Lab 06/26/15 1438 06/27/15 0733 06/28/15 0631  WBC 4.4 3.5* 4.3  HGB 15.2 13.3 13.6  HCT 42.4 38.1* 37.5*  MCV 100.7* 103.3* 103.6*  PLT 54* 44* 37*   Cardiac Enzymes:  Recent Labs Lab 06/26/15 1954  CKTOTAL 745*   BNP (last 3 results) No results for input(s): BNP in the last 8760 hours.  ProBNP (last 3 results) No results for input(s): PROBNP in the last 8760 hours.  CBG: No results for input(s): GLUCAP in the last 168 hours.  Recent Results (from the past 240 hour(s))  Urine culture     Status: None (Preliminary result)   Collection Time: 06/26/15  8:09 PM  Result Value Ref Range Status   Specimen Description URINE, RANDOM  Final   Special Requests NONE  Final   Culture >=100,000 COLONIES/mL STAPHYLOCOCCUS AUREUS  Final   Report Status PENDING  Incomplete     Studies: Dg Chest 2 View  06/26/2015  CLINICAL DATA:  Confusion. History of hypertension hepatitis-C and alcohol abuse. EXAM: CHEST  2 VIEW COMPARISON:  None. FINDINGS: The heart size and mediastinal contours are within normal limits. Both lungs are clear. The visualized skeletal structures are unremarkable. IMPRESSION: No active cardiopulmonary disease. Electronically Signed   By: Kerby Moors M.D.   On: 06/26/2015 18:41    Scheduled Meds: . folic acid  1 mg Oral Daily  . furosemide  60 mg Oral BID  . lactulose  30 g Oral BID  . LORazepam  0-4 mg Oral Q6H   Followed by  .  LORazepam  0-4 mg Oral Q12H  . metroNIDAZOLE  500 mg Oral Q12H  . multivitamin with minerals  1 tablet Oral Daily  . [START ON 06/29/2015] pneumococcal 23 valent vaccine  0.5 mL Intramuscular Tomorrow-1000  . rifaximin  550 mg Oral BID  . spironolactone  100 mg Oral Daily  . thiamine  100 mg Oral Daily   Continuous Infusions:    Principal Problem:   Hepatic encephalopathy (HCC) Active Problems:   UTI (urinary tract infection)   Hypertension   Cirrhosis (HCC)   Metabolic encephalopathy   Alcohol abuse   Trichomonas infection   AKI (acute kidney injury) ([place])   ARF (acute renal failure) ([place])    Time spent: 35 minutes    Havah Ammon L M.D. [facility] Pager [phone]. If 7PM-7AM, please contact night-coverage at [email or web address], password [facility] 06/28/2015, 4:30 PM  LOS: 2 days

## 2015-06-29 LAB — AMMONIA: Ammonia: 56 umol/L — ABNORMAL HIGH (ref 9–35)

## 2015-06-29 LAB — URINE CULTURE

## 2015-06-29 MED ORDER — SULFAMETHOXAZOLE-TRIMETHOPRIM 800-160 MG PO TABS: 1.0000 | ORAL_TABLET | Freq: Two times a day (BID) | ORAL | Status: DC

## 2015-06-29 MED ORDER — LACTULOSE 10 GM/15ML PO SOLN: 30.0000 g | Freq: Two times a day (BID) | ORAL | Status: DC

## 2015-06-29 MED ORDER — METRONIDAZOLE 500 MG PO TABS: 500.0000 mg | ORAL_TABLET | Freq: Two times a day (BID) | ORAL | Status: DC

## 2015-06-29 MED ORDER — SULFAMETHOXAZOLE-TRIMETHOPRIM 800-160 MG PO TABS
1.0000 | ORAL_TABLET | Freq: Two times a day (BID) | ORAL | Status: DC
  Administered 2015-06-29: 1 via ORAL
  Filled 2015-06-29: qty 1

## 2015-06-29 MED FILL — SULFAMETHOXAZOLE/TMP DS TAB: 800-160 | 6 days supply | Qty: 3 | Fill #0

## 2015-06-29 MED FILL — metroNIDAZOLE 500 MG TABS: 500 | 4 days supply | Qty: 7 | Fill #0

## 2015-06-29 MED FILL — GENERLAC 10 GM/15 ML SOLN: 10 | 3 days supply | Qty: 240 | Fill #0

## 2015-06-29 NOTE — Discharge Summary (Signed)
Physician Discharge Summary  Jose [name] [id number] DOB: 03/27/1956 DOA: 06/26/2015  PCP: Carmie Kanner, NP  Admit date: 06/26/2015 Discharge date: 06/29/2015  Time spent: greater than 30 minutes  Recommendations for Outpatient Follow-up:  1. Monitor BMET   Discharge Diagnoses:  Principal Problem:   Hepatic encephalopathy ([place]) Active Problems:   UTI (urinary tract infection)   Hypertension   Cirrhosis (HCC)   Metabolic encephalopathy   Alcohol abuse   Trichomonas infection   ARF (acute renal failure) ([place])   Discharge Condition: stable  Diet recommendation: heart healthy  Filed Weights   06/26/15 1432 06/27/15 0000  Weight: 81.647 kg (180 lb) 78.7 kg (173 lb 8 oz)    History of present illness:  60 y.o. male with a history of Cirrhosis, Hep C, HTN, Alcohol Abuse who presents to the ED with complaints of increased confusion, and lethargy over the past 2 days. He also reports having body aches. He denies any fevers or chills or nausea, vomiting or diarrhea. He reports his last drink of alcohol was 2 days ago. He was evaluated in the ED and found to have an Ammonia level of 135. He was administered a dose of PO Lactulose 30 grams in the ED and referred for admission.   Hospital Course:  hepatic encephalopathy Improved on lactulose and xifaxan. No asterixis at discharge and NH3 improved  UTI/ cx >100K staph aureus. Pan sensitive. Will treat for 5 days  Trichomonas: started flagyl. Not sexually active for >1 year.  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   history of alcohol abuse No DTs. Consulted SW  thrombocytopenia Secondary to cirrhosis/EtOH. At baseline. No bleeding  inguinal hernia Reducible. F/u surgery as outpatient   Procedures:  none  Consultations:  none  Discharge Exam: Filed Vitals:   06/29/15 0803 06/29/15 1206  BP:  124/84 136/89  Pulse: 55 55  Temp: 97.9 F (36.6 C) 98 F (36.7 C)  Resp: 17 18    General: a and o Cardiovascular: RRR Respiratory: CTA Ext no CCE abd s, nt, nd Neuro no asterixis  Discharge Instructions   Discharge Instructions    Activity as tolerated - No restrictions    Complete by:  As directed      Diet - low sodium heart healthy    Complete by:  As directed      Discharge instructions    Complete by:  As directed   Avoid alcohol     Discharge instructions    Complete by:  As directed   Stop maxide (stop triamterene/HCTZ)          Current Discharge Medication List    START taking these medications   Details  lactulose (CHRONULAC) 10 GM/15ML solution Take 45 mLs (30 g total) by mouth 2 (two) times daily. Qty: 240 mL, Refills: 0    metroNIDAZOLE (FLAGYL) 500 MG tablet Take 1 tablet (500 mg total) by mouth every 12 (twelve) hours. Qty: 7 tablet, Refills: 0    sulfamethoxazole-trimethoprim (BACTRIM DS,SEPTRA DS) 800-160 MG tablet Take 1 tablet by mouth every 12 (twelve) hours. Qty: 3 tablet, Refills: 0      CONTINUE these medications which have NOT CHANGED   Details  furosemide (LASIX) 40 MG tablet Take 1.5 tablets (60 mg total) by mouth 2 (two) times daily. Qty: 90 tablet, Refills: 3   Associated Diagnoses: Essential hypertension    spironolactone (ALDACTONE) 100 MG tablet Take  1 tablet (100 mg total) by mouth daily. Qty: 30 tablet, Refills: 3   Associated Diagnoses: Essential hypertension      STOP taking these medications     triamterene-hydrochlorothiazide (MAXZIDE-25) 37.5-25 MG tablet      Cholecalciferol (VITAMIN D) 1000 UNITS capsule      ondansetron (ZOFRAN) 4 MG tablet        No Known Allergies Follow-up Information    Follow up with PLACEY,MARY H, NP In 2 weeks.   Contact information:   [place] [address] [phone]       Schedule an appointment as soon as possible for a visit with Berger.   Specialty:  General Surgery   Why:  for hernia   Contact information:   [address] [place]  [place] [phone]        The results of significant diagnostics from this hospitalization (including imaging, microbiology, ancillary and laboratory) are listed below for reference.    Significant Diagnostic Studies: Dg Chest 2 View  06/26/2015  CLINICAL DATA:  Confusion. History of hypertension hepatitis-C and alcohol abuse. EXAM: CHEST  2 VIEW COMPARISON:  None. FINDINGS: The heart size and mediastinal contours are within normal limits. Both lungs are clear. The visualized skeletal structures are unremarkable. IMPRESSION: No active cardiopulmonary disease. Electronically Signed   By: Kerby Moors M.D.   On: 06/26/2015 18:41    Microbiology: Recent Results (from the past 240 hour(s))  Urine culture     Status: None   Collection Time: 06/26/15  8:09 PM  Result Value Ref Range Status   Specimen Description URINE, RANDOM  Final   Special Requests NONE  Final   Culture >=100,000 COLONIES/mL STAPHYLOCOCCUS AUREUS  Final   Report Status 06/29/2015 FINAL  Final   Organism ID, Bacteria STAPHYLOCOCCUS AUREUS  Final      Susceptibility   Staphylococcus aureus - MIC*    CIPROFLOXACIN <=0.5 SENSITIVE Sensitive     GENTAMICIN <=0.5 SENSITIVE Sensitive     NITROFURANTOIN <=16 SENSITIVE Sensitive     OXACILLIN <=0.25 SENSITIVE Sensitive     TETRACYCLINE <=1 SENSITIVE Sensitive     VANCOMYCIN <=0.5 SENSITIVE Sensitive     TRIMETH/SULFA <=10 SENSITIVE Sensitive     CLINDAMYCIN <=0.25 SENSITIVE Sensitive     RIFAMPIN <=0.5 SENSITIVE Sensitive     Inducible Clindamycin NEGATIVE Sensitive     * >=100,000 COLONIES/mL STAPHYLOCOCCUS AUREUS     Labs: Basic Metabolic Panel:  Recent Labs Lab 06/26/15 1438 06/27/15 0733 06/28/15 0631  NA 130* 132* 130*  K 4.6 4.3 4.3  CL 101 106 99*  CO2 19* 21* 21*  GLUCOSE 159* 105* 111*  BUN 31* 24* 18  CREATININE 1.76*  1.25* 1.14  CALCIUM 8.9 8.4* 8.4*  MG  --   --  1.6*   Liver Function Tests:  Recent Labs Lab 06/26/15 1438 06/28/15 0631  AST 148* 114*  ALT 69* 58  ALKPHOS 165* 130*  BILITOT 4.2* 2.3*  PROT 8.0 6.3*  ALBUMIN 2.7* 2.2*    Recent Labs Lab 06/26/15 1438  LIPASE 57*    Recent Labs Lab 06/26/15 2124 06/28/15 0631 06/29/15 0648  AMMONIA 135* 32 56*   CBC:  Recent Labs Lab 06/26/15 1438 06/27/15 0733 06/28/15 0631  WBC 4.4 3.5* 4.3  HGB 15.2 13.3 13.6  HCT 42.4 38.1* 37.5*  MCV 100.7* 103.3* 103.6*  PLT 54* 44* 37*   Cardiac Enzymes:  Recent Labs Lab 06/26/15 1954  CKTOTAL 745*   BNP: BNP (last 3 results) No results for input(s): BNP in the last 8760 hours.  ProBNP (last 3 results) No results for input(s): PROBNP in the last 8760 hours.  CBG: No results for input(s): GLUCAP in the last 168 hours.     Signed:  Delfina Redwood MD [facility] 06/29/2015, 1:51 PM

## 2015-06-29 NOTE — Progress Notes (Signed)
Pt given discharge instructions, prescriptions, and care notes. Pt verbalized understanding AEB no further questions or concerns at this time. IV was discontinued, no redness, pain, or swelling noted at this time. Pt left the floor via wheelchair with staff in stable condition. 

## 2015-06-30 ENCOUNTER — Telehealth (HOSPITAL_BASED_OUTPATIENT_CLINIC_OR_DEPARTMENT_OTHER): Payer: Self-pay | Admitting: Emergency Medicine

## 2015-06-30 NOTE — Telephone Encounter (Signed)
Post ED Visit - Positive Culture Follow-up  Culture report reviewed by antimicrobial stewardship pharmacist:  []  Elenor Quinones, Pharm.D. []  Heide Guile, Pharm.D., BCPS []  Parks Neptune, Pharm.D. []  Alycia Rossetti, Pharm.D., BCPS []  [place], Pharm.D., BCPS, AAHIVP []  Legrand Como, Pharm.D., BCPS, AAHIVP [x]  Milus Glazier, Pharm.D. []  Stephens November, Pharm.D.  Positive urine culture  Treated with bactrim, organism sensitive to the same and no further patient follow-up is required at this time.  Hazle Nordmann 06/30/2015, 10:44 AM

## 2015-07-03 MED FILL — GENERLAC 10 GM/15 ML SOLN: 10 | 30 days supply | Qty: 450 | Fill #0

## 2015-07-10 ENCOUNTER — Telehealth: Payer: Self-pay | Admitting: Hematology and Oncology

## 2015-07-10 NOTE — Telephone Encounter (Signed)
Called pt to complete pt intake. Pt's vm not set up. Will call again.

## 2015-07-11 ENCOUNTER — Telehealth: Payer: Self-pay | Admitting: Hematology and Oncology

## 2015-07-11 NOTE — Telephone Encounter (Signed)
Pt is aware of np appt. On 07/12/15@12 :30. I wanted to verify pt address and insurance instead he hung up.

## 2015-07-12 ENCOUNTER — Ambulatory Visit (HOSPITAL_BASED_OUTPATIENT_CLINIC_OR_DEPARTMENT_OTHER): Payer: Medicaid Other | Admitting: Hematology and Oncology

## 2015-07-12 ENCOUNTER — Telehealth: Payer: Self-pay | Admitting: Hematology and Oncology

## 2015-07-12 ENCOUNTER — Encounter: Payer: Self-pay | Admitting: Hematology and Oncology

## 2015-07-12 VITALS — BP 118/60 | HR 58 | Temp 98.6°F | Resp 19 | Wt 190.4 lb

## 2015-07-12 DIAGNOSIS — B182 Chronic viral hepatitis C: Secondary | ICD-10-CM

## 2015-07-12 DIAGNOSIS — D696 Thrombocytopenia, unspecified: Secondary | ICD-10-CM | POA: Diagnosis present

## 2015-07-12 DIAGNOSIS — F1020 Alcohol dependence, uncomplicated: Secondary | ICD-10-CM | POA: Diagnosis not present

## 2015-07-12 MED FILL — FUROSEMIDE 40 MG TABLET: 40 | 15 days supply | Qty: 45 | Fill #1

## 2015-07-12 NOTE — Assessment & Plan Note (Signed)
The patient have history of noncompliance, and continues to drink. He has untreated hepatitis C. I recommend the patient to stay abstinent and if he can quit drinking for 3 months or longer, he can be referred back to infectious disease clinic for possible treatment for hepatitis C

## 2015-07-12 NOTE — Telephone Encounter (Signed)
Gave and pritned appt shced and avs fo rpt for Sept

## 2015-07-12 NOTE — Assessment & Plan Note (Signed)
The thrombocytopenia is related to untreated hepatitis C and liver disease along with alcoholism. He is not bleeding and does not require further treatment. I will see him back in 6 months for further follow-up

## 2015-07-12 NOTE — Progress Notes (Signed)
[facility] NOTE  Patient Care Team: Marliss Coots, NP as PCP - General Langley Gauss, MD as Consulting Physician (Gastroenterology) [name], MD as Attending Physician (Internal Medicine)  CHIEF COMPLAINTS/PURPOSE OF CONSULTATION:  Chronic thrombocytopenia, untreated hepatitis C  HISTORY OF PRESENTING ILLNESS:  Jose [name] 60 y.o. male is here because of thrombocytopenia.  He was found to have abnormal CBC from recent blood work. This patient have chronic untreated hepatitis C, liver disease with cirrhosis and chronic alcoholism. He was recently discharged from the hospital with acute encephalopathy which improved upon supportive care management. He denies recent bruising/bleeding, such as spontaneous epistaxis, hematuria, melena or hematochezia The patient had poor social circumstances. He lives in a group home/facility. He continues to drink on a regular basis, last alcohol intake was over a day ago. He has not been treated for hepatitis C, presumably due to noncompliance and chronic alcoholism. He has chronic right lower abdominal pain from abdominal hernia but that was not surgically repaired due to chronic thrombocytopenia  MEDICAL HISTORY:  Past Medical History  Diagnosis Date  . Hypertension   . Hepatitis C   . Alcohol abuse   . Ascites   . Cirrhosis ([place])   . Shortness of breath     with exertion  . Depression   . Alcoholism ([place]) 07/12/2015    SURGICAL HISTORY: Past Surgical History  Procedure Laterality Date  . Colonoscopy  march 2015    Dr. Renee Harder: nodular mucosa at appendiceal orifice, tubular adenoma, extremely poor prep  . Circumcision      SOCIAL HISTORY: Social History   Social History  . Marital Status: Single    Spouse Name: N/A  . Number of Children: N/A  . Years of Education: N/A   Occupational History  . Not on file.   Social History Main Topics  . Smoking status: Light Tobacco Smoker -- 0.50  packs/day    Types: Cigarettes  . Smokeless tobacco: Never Used     Comment: STATES HE SMOKES 2-3 TIMES MONTHLY  . Alcohol Use: Yes     Comment: A couple beers every few days   . Drug Use: Yes    Special: Marijuana, Cocaine     Comment: last used 11/03/13  . 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 Not on file   Social History Narrative    FAMILY HISTORY: Family History  Problem Relation Age of Onset  . Breast cancer Mother   . Hypertension Mother   . Diabetes Father   . Hypertension Sister   . Heart disease Sister   . Hypertension Paternal Grandmother   . Colon cancer Neg Hx     ALLERGIES:  has No Known Allergies.  MEDICATIONS:  Current Outpatient Prescriptions  Medication Sig Dispense Refill  . furosemide (LASIX) 40 MG tablet Take 1.5 tablets (60 mg total) by mouth 2 (two) times daily. 90 tablet 3  . lactulose (CHRONULAC) 10 GM/15ML solution Take 45 mLs (30 g total) by mouth 2 (two) times daily. (Patient taking differently: Take 30 g by mouth daily. ) 240 mL 0  . Multiple Vitamin (MULTIVITAMIN) tablet Take 1 tablet by mouth daily.    Marland Kitchen spironolactone (ALDACTONE) 100 MG tablet Take 100 mg by mouth daily.  3   No current facility-administered medications for this visit.    REVIEW OF SYSTEMS:   Constitutional: Denies fevers, chills or abnormal night sweats  Eyes: Denies blurriness of vision, double vision or watery eyes Ears, nose, mouth, throat, and face: Denies mucositis or sore throat Respiratory: Denies cough, dyspnea or wheezes Cardiovascular: Denies palpitation, chest discomfort or lower extremity swelling Gastrointestinal:  Denies nausea, heartburn or change in bowel habits Skin: Denies abnormal skin rashes Lymphatics: Denies new lymphadenopathy or easy bruising Neurological:Denies numbness, tingling or new weaknesses Behavioral/Psych: Mood is stable, no new changes  All other systems were reviewed with  the patient and are negative.  PHYSICAL EXAMINATION: ECOG PERFORMANCE STATUS: 1 - Symptomatic but completely ambulatory  Filed Vitals:   07/12/15 1311  BP: 118/60  Pulse: 58  Temp: 98.6 F (37 C)  Resp: 19   Filed Weights   07/12/15 1311  Weight: 190 lb 6.4 oz (86.365 kg)    GENERAL:alert, no distress and comfortable SKIN: skin color, texture, turgor are normal, no rashes or significant lesions EYES: normal, conjunctiva are Mildly jaundiced OROPHARYNX:no exudate, no erythema and lips, buccal mucosa, and tongue normal  NECK: supple, thyroid normal size, non-tender, without nodularity LYMPH:  no palpable lymphadenopathy in the cervical, axillary or inguinal LUNGS: clear to auscultation and percussion with normal breathing effort HEART: regular rate & rhythm and no murmurs and no lower extremity edema ABDOMEN:abdomen soft, non-tender and normal bowel sounds. It appears distended with ascites Musculoskeletal:no cyanosis of digits and no clubbing  PSYCH: alert & oriented x 3 with fluent speech NEURO: no focal motor/sensory deficits  LABORATORY DATA:  I have reviewed the data as listed Recent Results (from the past 2160 hour(s))  GC/Chlamydia probe amp ([place])not at [facility]     Status: None   Collection Time: 06/26/15 12:00 AM  Result Value Ref Range   Chlamydia Negative     Comment: Normal Reference Range - Negative   Neisseria gonorrhea Negative     Comment: Normal Reference Range - Negative  Lipase, blood     Status: Abnormal   Collection Time: 06/26/15  2:38 PM  Result Value Ref Range   Lipase 57 (H) 11 - 51 U/L  Comprehensive metabolic panel     Status: Abnormal   Collection Time: 06/26/15  2:38 PM  Result Value Ref Range   Sodium 130 (L) 135 - 145 mmol/L   Potassium 4.6 3.5 - 5.1 mmol/L   Chloride 101 101 - 111 mmol/L   CO2 19 (L) 22 - 32 mmol/L   Glucose, Bld 159 (H) 65 - 99 mg/dL   BUN 31 (H) 6 - 20 mg/dL   Creatinine, Ser 1.76 (H) 0.61 - 1.24 mg/dL    Calcium 8.9 8.9 - 10.3 mg/dL   Total Protein 8.0 6.5 - 8.1 g/dL   Albumin 2.7 (L) 3.5 - 5.0 g/dL   AST 148 (H) 15 - 41 U/L   ALT 69 (H) 17 - 63 U/L   Alkaline Phosphatase 165 (H) 38 - 126 U/L   Total Bilirubin 4.2 (H) 0.3 - 1.2 mg/dL   GFR calc non Af Amer 41 (L) >60 mL/min   GFR calc Af Amer 47 (L) >60 mL/min    Comment: (NOTE) The eGFR has been calculated using the CKD EPI equation. This calculation has not been validated in all clinical situations. eGFR's persistently <60 mL/min signify possible Chronic Kidney Disease.    Anion gap 10 5 - 15  CBC     Status: Abnormal   Collection Time: 06/26/15  2:38 PM  Result Value Ref Range   WBC 4.4 4.0 - 10.5 K/uL   RBC 4.21 (  L) 4.22 - 5.81 MIL/uL   Hemoglobin 15.2 13.0 - 17.0 g/dL   HCT 42.4 39.0 - 52.0 %   MCV 100.7 (H) 78.0 - 100.0 fL   MCH 36.1 (H) 26.0 - 34.0 pg   MCHC 35.8 30.0 - 36.0 g/dL   RDW 13.7 11.5 - 15.5 %   Platelets 54 (L) 150 - 400 K/uL    Comment: REPEATED TO VERIFY PLATELET COUNT CONFIRMED BY SMEAR   CK     Status: Abnormal   Collection Time: 06/26/15  7:54 PM  Result Value Ref Range   Total CK 745 (H) 49 - 397 U/L  Urinalysis, Routine w reflex microscopic (not at [facility])     Status: Abnormal   Collection Time: 06/26/15  8:09 PM  Result Value Ref Range   Color, Urine ORANGE (A) YELLOW    Comment: BIOCHEMICALS MAY BE AFFECTED BY COLOR   APPearance CLEAR CLEAR   Specific Gravity, Urine 1.021 1.005 - 1.030   pH 6.0 5.0 - 8.0   Glucose, UA NEGATIVE NEGATIVE mg/dL   Hgb urine dipstick MODERATE (A) NEGATIVE   Bilirubin Urine SMALL (A) NEGATIVE   Ketones, ur 15 (A) NEGATIVE mg/dL   Protein, ur NEGATIVE NEGATIVE mg/dL   Nitrite POSITIVE (A) NEGATIVE   Leukocytes, UA LARGE (A) NEGATIVE  Urine microscopic-add on     Status: Abnormal   Collection Time: 06/26/15  8:09 PM  Result Value Ref Range   Squamous Epithelial / LPF 6-30 (A) NONE SEEN   WBC, UA TOO NUMEROUS TO COUNT 0 - 5 WBC/hpf   RBC / HPF 0-5 0 - 5 RBC/hpf    Bacteria, UA FEW (A) NONE SEEN   Casts HYALINE CASTS (A) NEGATIVE   Urine-Other TRICHOMONAS PRESENT   Urine rapid drug screen (hosp performed)     Status: None   Collection Time: 06/26/15  8:09 PM  Result Value Ref Range   Opiates NONE DETECTED NONE DETECTED   Cocaine NONE DETECTED NONE DETECTED   Benzodiazepines NONE DETECTED NONE DETECTED   Amphetamines NONE DETECTED NONE DETECTED   Tetrahydrocannabinol NONE DETECTED NONE DETECTED   Barbiturates NONE DETECTED NONE DETECTED    Comment:        DRUG SCREEN FOR MEDICAL PURPOSES ONLY.  IF CONFIRMATION IS NEEDED FOR ANY PURPOSE, NOTIFY LAB WITHIN 5 DAYS.        LOWEST DETECTABLE LIMITS FOR URINE DRUG SCREEN Drug Class       Cutoff (ng/mL) Amphetamine      1000 Barbiturate      200 Benzodiazepine   151 Tricyclics       761 Opiates          300 Cocaine          300 THC              50   Urine culture     Status: None   Collection Time: 06/26/15  8:09 PM  Result Value Ref Range   Specimen Description URINE, RANDOM    Special Requests NONE    Culture >=100,000 COLONIES/mL STAPHYLOCOCCUS AUREUS    Report Status 06/29/2015 FINAL    Organism ID, Bacteria STAPHYLOCOCCUS AUREUS       Susceptibility   Staphylococcus aureus - MIC*    CIPROFLOXACIN <=0.5 SENSITIVE Sensitive     GENTAMICIN <=0.5 SENSITIVE Sensitive     NITROFURANTOIN <=16 SENSITIVE Sensitive     OXACILLIN <=0.25 SENSITIVE Sensitive     TETRACYCLINE <=1 SENSITIVE Sensitive  VANCOMYCIN <=0.5 SENSITIVE Sensitive     TRIMETH/SULFA <=10 SENSITIVE Sensitive     CLINDAMYCIN <=0.25 SENSITIVE Sensitive     RIFAMPIN <=0.5 SENSITIVE Sensitive     Inducible Clindamycin NEGATIVE Sensitive     * >=100,000 COLONIES/mL STAPHYLOCOCCUS AUREUS  Ammonia     Status: Abnormal   Collection Time: 06/26/15  9:24 PM  Result Value Ref Range   Ammonia 135 (H) 9 - 35 umol/L  Ethanol     Status: None   Collection Time: 06/26/15  9:34 PM  Result Value Ref Range   Alcohol, Ethyl (B) <5  <5 mg/dL    Comment:        LOWEST DETECTABLE LIMIT FOR SERUM ALCOHOL IS 5 mg/dL FOR MEDICAL PURPOSES ONLY   HIV antibody (routine testing) (NOT for [facility])     Status: None   Collection Time: 06/26/15  9:34 PM  Result Value Ref Range   HIV Screen 4th Generation wRfx Non Reactive Non Reactive    Comment: (NOTE) Performed At: [facility] [address] [place], [place] [id number] Lindon Romp MD [id number]   RPR     Status: None   Collection Time: 06/26/15  9:34 PM  Result Value Ref Range   RPR Ser Ql Non Reactive Non Reactive    Comment: (NOTE) Performed At: [facility] [address] [place], [place] [id number] Lindon Romp MD [id number]   Influenza panel by PCR (type A & B, H1N1)     Status: None   Collection Time: 06/26/15 10:03 PM  Result Value Ref Range   Influenza A By PCR NEGATIVE NEGATIVE   Influenza B By PCR NEGATIVE NEGATIVE   H1N1 flu by pcr NOT DETECTED NOT DETECTED    Comment:        The Xpert Flu assay (FDA approved for nasal aspirates or washes and nasopharyngeal swab specimens), is intended as an aid in the diagnosis of influenza and should not be used as a sole basis for treatment.   Basic metabolic panel     Status: Abnormal   Collection Time: 06/27/15  7:33 AM  Result Value Ref Range   Sodium 132 (L) 135 - 145 mmol/L   Potassium 4.3 3.5 - 5.1 mmol/L   Chloride 106 101 - 111 mmol/L   CO2 21 (L) 22 - 32 mmol/L   Glucose, Bld 105 (H) 65 - 99 mg/dL   BUN 24 (H) 6 - 20 mg/dL   Creatinine, Ser 1.25 (H) 0.61 - 1.24 mg/dL   Calcium 8.4 (L) 8.9 - 10.3 mg/dL   GFR calc non Af Amer >60 >60 mL/min   GFR calc Af Amer >60 >60 mL/min    Comment: (NOTE) The eGFR has been calculated using the CKD EPI equation. This calculation has not been validated in all clinical situations. eGFR's persistently <60 mL/min signify possible Chronic Kidney Disease.    Anion gap 5 5 - 15  CBC     Status: Abnormal   Collection Time: 06/27/15   7:33 AM  Result Value Ref Range   WBC 3.5 (L) 4.0 - 10.5 K/uL   RBC 3.69 (L) 4.22 - 5.81 MIL/uL   Hemoglobin 13.3 13.0 - 17.0 g/dL   HCT 38.1 (L) 39.0 - 52.0 %   MCV 103.3 (H) 78.0 - 100.0 fL   MCH 36.0 (H) 26.0 - 34.0 pg   MCHC 34.9 30.0 - 36.0 g/dL   RDW 14.0 11.5 - 15.5 %   Platelets 44 (L) 150 - 400  K/uL    Comment: CONSISTENT WITH PREVIOUS RESULT  Ammonia     Status: None   Collection Time: 06/28/15  6:31 AM  Result Value Ref Range   Ammonia 32 9 - 35 umol/L  Comprehensive metabolic panel     Status: Abnormal   Collection Time: 06/28/15  6:31 AM  Result Value Ref Range   Sodium 130 (L) 135 - 145 mmol/L   Potassium 4.3 3.5 - 5.1 mmol/L   Chloride 99 (L) 101 - 111 mmol/L   CO2 21 (L) 22 - 32 mmol/L   Glucose, Bld 111 (H) 65 - 99 mg/dL   BUN 18 6 - 20 mg/dL   Creatinine, Ser 1.14 0.61 - 1.24 mg/dL   Calcium 8.4 (L) 8.9 - 10.3 mg/dL   Total Protein 6.3 (L) 6.5 - 8.1 g/dL   Albumin 2.2 (L) 3.5 - 5.0 g/dL   AST 114 (H) 15 - 41 U/L   ALT 58 17 - 63 U/L   Alkaline Phosphatase 130 (H) 38 - 126 U/L   Total Bilirubin 2.3 (H) 0.3 - 1.2 mg/dL   GFR calc non Af Amer >60 >60 mL/min   GFR calc Af Amer >60 >60 mL/min    Comment: (NOTE) The eGFR has been calculated using the CKD EPI equation. This calculation has not been validated in all clinical situations. eGFR's persistently <60 mL/min signify possible Chronic Kidney Disease.    Anion gap 10 5 - 15  CBC     Status: Abnormal   Collection Time: 06/28/15  6:31 AM  Result Value Ref Range   WBC 4.3 4.0 - 10.5 K/uL   RBC 3.62 (L) 4.22 - 5.81 MIL/uL   Hemoglobin 13.6 13.0 - 17.0 g/dL   HCT 37.5 (L) 39.0 - 52.0 %   MCV 103.6 (H) 78.0 - 100.0 fL   MCH 37.6 (H) 26.0 - 34.0 pg   MCHC 36.3 (H) 30.0 - 36.0 g/dL   RDW 13.9 11.5 - 15.5 %   Platelets 37 (L) 150 - 400 K/uL    Comment: CONSISTENT WITH PREVIOUS RESULT  Magnesium     Status: Abnormal   Collection Time: 06/28/15  6:31 AM  Result Value Ref Range   Magnesium 1.6 (L) 1.7 -  2.4 mg/dL  Ammonia     Status: Abnormal   Collection Time: 06/29/15  6:48 AM  Result Value Ref Range   Ammonia 56 (H) 9 - 35 umol/L    RADIOGRAPHIC STUDIES: I have personally reviewed the radiological images as listed and agreed with the findings in the report. Dg Chest 2 View  06/26/2015  CLINICAL DATA:  Confusion. History of hypertension hepatitis-C and alcohol abuse. EXAM: CHEST  2 VIEW COMPARISON:  None. FINDINGS: The heart size and mediastinal contours are within normal limits. Both lungs are clear. The visualized skeletal structures are unremarkable. IMPRESSION: No active cardiopulmonary disease. Electronically Signed   By: Kerby Moors M.D.   On: 06/26/2015 18:41    ASSESSMENT & PLAN Thrombocytopenia, unspecified (HCC) The thrombocytopenia is related to untreated hepatitis C and liver disease along with alcoholism. He is not bleeding and does not require further treatment. I will see him back in 6 months for further follow-up  Alcoholism [facility]) The patient have untreated hepatitis C due to noncompliance and alcoholism. I spoke with the patient extensively about staying abstinent and to stop drinking  Chronic hepatitis C without hepatic coma ([place]) The patient have history of noncompliance, and continues to drink. He has untreated hepatitis C.  I recommend the patient to stay abstinent and if he can quit drinking for 3 months or longer, he can be referred back to infectious disease clinic for possible treatment for hepatitis C

## 2015-07-12 NOTE — Assessment & Plan Note (Signed)
The patient have untreated hepatitis C due to noncompliance and alcoholism. I spoke with the patient extensively about staying abstinent and to stop drinking 

## 2015-07-17 MED FILL — TRIAMTERENE-HCTZ 37.5-25 MG: 37.5-25 | 30 days supply | Qty: 30 | Fill #0

## 2015-07-23 ENCOUNTER — Encounter (HOSPITAL_COMMUNITY): Payer: Self-pay | Admitting: *Deleted

## 2015-07-23 ENCOUNTER — Emergency Department (HOSPITAL_COMMUNITY): Payer: Medicaid Other

## 2015-07-23 ENCOUNTER — Inpatient Hospital Stay (HOSPITAL_COMMUNITY)
Admission: EM | Admit: 2015-07-23 | Discharge: 2015-07-29 | DRG: 442 | Disposition: A | Discharge status: Skilled Nursing Facility | Payer: Medicaid Other | Attending: Internal Medicine | Admitting: Internal Medicine

## 2015-07-23 DIAGNOSIS — N184 Chronic kidney disease, stage 4 (severe): Secondary | ICD-10-CM | POA: Diagnosis present

## 2015-07-23 DIAGNOSIS — K7682 Hepatic encephalopathy: Secondary | ICD-10-CM | POA: Diagnosis present

## 2015-07-23 DIAGNOSIS — K7460 Unspecified cirrhosis of liver: Secondary | ICD-10-CM

## 2015-07-23 DIAGNOSIS — K7200 Acute and subacute hepatic failure without coma: Principal | ICD-10-CM | POA: Diagnosis present

## 2015-07-23 DIAGNOSIS — E876 Hypokalemia: Secondary | ICD-10-CM | POA: Diagnosis present

## 2015-07-23 DIAGNOSIS — D638 Anemia in other chronic diseases classified elsewhere: Secondary | ICD-10-CM | POA: Diagnosis not present

## 2015-07-23 DIAGNOSIS — K648 Other hemorrhoids: Secondary | ICD-10-CM | POA: Diagnosis present

## 2015-07-23 DIAGNOSIS — I4581 Long QT syndrome: Secondary | ICD-10-CM

## 2015-07-23 DIAGNOSIS — E860 Dehydration: Secondary | ICD-10-CM | POA: Diagnosis present

## 2015-07-23 DIAGNOSIS — F1010 Alcohol abuse, uncomplicated: Secondary | ICD-10-CM | POA: Diagnosis not present

## 2015-07-23 DIAGNOSIS — M25562 Pain in left knee: Secondary | ICD-10-CM | POA: Diagnosis not present

## 2015-07-23 DIAGNOSIS — D61818 Other pancytopenia: Secondary | ICD-10-CM | POA: Diagnosis present

## 2015-07-23 DIAGNOSIS — R52 Pain, unspecified: Secondary | ICD-10-CM

## 2015-07-23 DIAGNOSIS — D539 Nutritional anemia, unspecified: Secondary | ICD-10-CM | POA: Diagnosis present

## 2015-07-23 DIAGNOSIS — K7031 Alcoholic cirrhosis of liver with ascites: Secondary | ICD-10-CM | POA: Diagnosis present

## 2015-07-23 DIAGNOSIS — F1020 Alcohol dependence, uncomplicated: Secondary | ICD-10-CM | POA: Diagnosis present

## 2015-07-23 DIAGNOSIS — F17210 Nicotine dependence, cigarettes, uncomplicated: Secondary | ICD-10-CM | POA: Diagnosis present

## 2015-07-23 DIAGNOSIS — I10 Essential (primary) hypertension: Secondary | ICD-10-CM | POA: Diagnosis not present

## 2015-07-23 DIAGNOSIS — R5381 Other malaise: Secondary | ICD-10-CM | POA: Insufficient documentation

## 2015-07-23 DIAGNOSIS — K7290 Hepatic failure, unspecified without coma: Secondary | ICD-10-CM | POA: Diagnosis present

## 2015-07-23 DIAGNOSIS — Z79899 Other long term (current) drug therapy: Secondary | ICD-10-CM

## 2015-07-23 DIAGNOSIS — K625 Hemorrhage of anus and rectum: Secondary | ICD-10-CM | POA: Diagnosis not present

## 2015-07-23 DIAGNOSIS — R400 Somnolence: Secondary | ICD-10-CM

## 2015-07-23 DIAGNOSIS — N179 Acute kidney failure, unspecified: Secondary | ICD-10-CM | POA: Diagnosis present

## 2015-07-23 DIAGNOSIS — N189 Chronic kidney disease, unspecified: Secondary | ICD-10-CM

## 2015-07-23 DIAGNOSIS — D696 Thrombocytopenia, unspecified: Secondary | ICD-10-CM | POA: Diagnosis present

## 2015-07-23 DIAGNOSIS — I129 Hypertensive chronic kidney disease with stage 1 through stage 4 chronic kidney disease, or unspecified chronic kidney disease: Secondary | ICD-10-CM | POA: Diagnosis present

## 2015-07-23 DIAGNOSIS — R001 Bradycardia, unspecified: Secondary | ICD-10-CM | POA: Diagnosis present

## 2015-07-23 DIAGNOSIS — D72819 Decreased white blood cell count, unspecified: Secondary | ICD-10-CM

## 2015-07-23 DIAGNOSIS — B182 Chronic viral hepatitis C: Secondary | ICD-10-CM

## 2015-07-23 LAB — CBC WITH DIFFERENTIAL/PLATELET
Basophils Absolute: 0 10*3/uL (ref 0.0–0.1)
Basophils Relative: 1 %
Eosinophils Absolute: 0.1 10*3/uL (ref 0.0–0.7)
Eosinophils Relative: 3 %
HEMATOCRIT: 35 % — AB (ref 39.0–52.0)
HEMOGLOBIN: 12 g/dL — AB (ref 13.0–17.0)
LYMPHS ABS: 0.9 10*3/uL (ref 0.7–4.0)
LYMPHS PCT: 26 %
MCH: 35.7 pg — AB (ref 26.0–34.0)
MCHC: 34.3 g/dL (ref 30.0–36.0)
MCV: 104.2 fL — AB (ref 78.0–100.0)
MONOS PCT: 18 %
Monocytes Absolute: 0.6 10*3/uL (ref 0.1–1.0)
NEUTROS ABS: 1.7 10*3/uL (ref 1.7–7.7)
NEUTROS PCT: 52 %
Platelets: 52 10*3/uL — ABNORMAL LOW (ref 150–400)
RBC: 3.36 MIL/uL — ABNORMAL LOW (ref 4.22–5.81)
RDW: 14 % (ref 11.5–15.5)
WBC: 3.3 10*3/uL — ABNORMAL LOW (ref 4.0–10.5)

## 2015-07-23 LAB — RAPID URINE DRUG SCREEN, HOSP PERFORMED
AMPHETAMINES: NOT DETECTED
BARBITURATES: NOT DETECTED
BENZODIAZEPINES: NOT DETECTED
COCAINE: NOT DETECTED
Opiates: NOT DETECTED
TETRAHYDROCANNABINOL: NOT DETECTED

## 2015-07-23 LAB — COMPREHENSIVE METABOLIC PANEL
ALK PHOS: 225 U/L — AB (ref 38–126)
ALT: 58 U/L (ref 17–63)
ANION GAP: 11 (ref 5–15)
AST: 124 U/L — ABNORMAL HIGH (ref 15–41)
Albumin: 2.2 g/dL — ABNORMAL LOW (ref 3.5–5.0)
BILIRUBIN TOTAL: 1.2 mg/dL (ref 0.3–1.2)
BUN: 35 mg/dL — ABNORMAL HIGH (ref 6–20)
CALCIUM: 8.1 mg/dL — AB (ref 8.9–10.3)
CO2: 24 mmol/L (ref 22–32)
CREATININE: 1.88 mg/dL — AB (ref 0.61–1.24)
Chloride: 103 mmol/L (ref 101–111)
GFR calc non Af Amer: 37 mL/min — ABNORMAL LOW (ref >60)
GFR, EST AFRICAN AMERICAN: 43 mL/min — AB (ref >60)
Glucose, Bld: 138 mg/dL — ABNORMAL HIGH (ref 65–99)
Potassium: 2.9 mmol/L — ABNORMAL LOW (ref 3.5–5.1)
Sodium: 138 mmol/L (ref 135–145)
TOTAL PROTEIN: 6.5 g/dL (ref 6.5–8.1)

## 2015-07-23 LAB — CBG MONITORING, ED: Glucose-Capillary: 203 mg/dL — ABNORMAL HIGH (ref 65–99)

## 2015-07-23 LAB — I-STAT CG4 LACTIC ACID, ED
Lactic Acid, Venous: 1.84 mmol/L (ref 0.5–2.0)
Lactic Acid, Venous: 1.75 mmol/L (ref 0.5–2.0)

## 2015-07-23 LAB — I-STAT VENOUS BLOOD GAS, ED
ACID-BASE EXCESS: 2 mmol/L (ref 0.0–2.0)
BICARBONATE: 24.4 meq/L — AB (ref 20.0–24.0)
O2 SAT: 81 %
PH VEN: 7.52 — AB (ref 7.250–7.300)
TCO2: 25 mmol/L (ref 0–100)
pCO2, Ven: 29.9 mmHg — ABNORMAL LOW (ref 45.0–50.0)
pO2, Ven: 39 mmHg (ref 31.0–45.0)

## 2015-07-23 LAB — PROTIME-INR
INR: 1.29 (ref 0.00–1.49)
Prothrombin Time: 16.2 seconds — ABNORMAL HIGH (ref 11.6–15.2)

## 2015-07-23 LAB — AMMONIA: AMMONIA: 264 umol/L — AB (ref 9–35)

## 2015-07-23 LAB — URINALYSIS, ROUTINE W REFLEX MICROSCOPIC
GLUCOSE, UA: NEGATIVE mg/dL
HGB URINE DIPSTICK: NEGATIVE
Ketones, ur: NEGATIVE mg/dL
Leukocytes, UA: NEGATIVE
Nitrite: NEGATIVE
Protein, ur: NEGATIVE mg/dL
SPECIFIC GRAVITY, URINE: 1.016 (ref 1.005–1.030)
pH: 6 (ref 5.0–8.0)

## 2015-07-23 LAB — ETHANOL: Alcohol, Ethyl (B): <5 mg/dL (ref <5)

## 2015-07-23 LAB — I-STAT TROPONIN, ED: TROPONIN I, POC: 0.02 ng/mL (ref 0.00–0.08)

## 2015-07-23 MED ORDER — THIAMINE HCL 100 MG/ML IJ SOLN: 100.0000 mg | Freq: Every day | INTRAMUSCULAR | Status: DC

## 2015-07-23 MED ORDER — NALOXONE HCL 0.4 MG/ML IJ SOLN
0.4000 mg | Freq: Once | INTRAMUSCULAR | Status: AC
  Administered 2015-07-23: 0.4 mg via INTRAVENOUS
  Filled 2015-07-23: qty 1

## 2015-07-23 MED ORDER — FUROSEMIDE 40 MG PO TABS
60.0000 mg | ORAL_TABLET | Freq: Two times a day (BID) | ORAL | Status: DC
  Administered 2015-07-24 – 2015-07-29 (×11): 60 mg via ORAL
  Filled 2015-07-23: qty 3
  Filled 2015-07-23 (×6): qty 1
  Filled 2015-07-23: qty 3
  Filled 2015-07-23 (×3): qty 1

## 2015-07-23 MED ORDER — ACETAMINOPHEN 650 MG RE SUPP: 650.0000 mg | Freq: Four times a day (QID) | RECTAL | Status: DC | PRN

## 2015-07-23 MED ORDER — ACETAMINOPHEN 325 MG PO TABS
650.0000 mg | ORAL_TABLET | Freq: Four times a day (QID) | ORAL | Status: DC | PRN
  Administered 2015-07-24 – 2015-07-27 (×4): 650 mg via ORAL
  Filled 2015-07-23 (×4): qty 2

## 2015-07-23 MED ORDER — SODIUM CHLORIDE 0.9 % IV BOLUS (SEPSIS): 1000.0000 mL | Freq: Once | INTRAVENOUS | Status: DC

## 2015-07-23 MED ORDER — LACTULOSE ENEMA
300.0000 mL | Freq: Once | ORAL | Status: AC
  Administered 2015-07-23: 300 mL via RECTAL
  Filled 2015-07-23: qty 300

## 2015-07-23 MED ORDER — LACTULOSE 10 GM/15ML PO SOLN
30.0000 g | Freq: Once | ORAL | Status: DC
  Filled 2015-07-23: qty 45

## 2015-07-23 MED ORDER — MAGNESIUM SULFATE 2 GM/50ML IV SOLN
2.0000 g | Freq: Once | INTRAVENOUS | Status: AC
  Administered 2015-07-23: 2 g via INTRAVENOUS
  Filled 2015-07-23: qty 50

## 2015-07-23 MED ORDER — ADULT MULTIVITAMIN W/MINERALS CH
1.0000 | ORAL_TABLET | Freq: Every day | ORAL | Status: DC
  Administered 2015-07-24 – 2015-07-29 (×6): 1 via ORAL
  Filled 2015-07-23 (×6): qty 1

## 2015-07-23 MED ORDER — SPIRONOLACTONE 25 MG PO TABS
100.0000 mg | ORAL_TABLET | Freq: Every day | ORAL | Status: DC
  Administered 2015-07-24 – 2015-07-29 (×6): 100 mg via ORAL
  Filled 2015-07-23: qty 1
  Filled 2015-07-23 (×5): qty 4

## 2015-07-23 MED ORDER — SODIUM CHLORIDE 0.9% FLUSH
3.0000 mL | Freq: Two times a day (BID) | INTRAVENOUS | Status: DC
  Administered 2015-07-23 – 2015-07-29 (×12): 3 mL via INTRAVENOUS

## 2015-07-23 MED ORDER — ONDANSETRON HCL 4 MG PO TABS: 4.0000 mg | ORAL_TABLET | Freq: Four times a day (QID) | ORAL | Status: DC | PRN

## 2015-07-23 MED ORDER — THIAMINE HCL 100 MG/ML IJ SOLN
Freq: Once | INTRAVENOUS | Status: AC
  Administered 2015-07-23: via INTRAVENOUS
  Filled 2015-07-23: qty 1000

## 2015-07-23 MED ORDER — FOLIC ACID 5 MG/ML IJ SOLN
1.0000 mg | Freq: Every day | INTRAMUSCULAR | Status: DC
  Filled 2015-07-23: qty 0.2

## 2015-07-23 MED ORDER — NALOXONE HCL 0.4 MG/ML IJ SOLN
INTRAMUSCULAR | Status: AC
  Filled 2015-07-23: qty 1

## 2015-07-23 MED ORDER — LACTULOSE 10 GM/15ML PO SOLN: 30.0000 g | Freq: Three times a day (TID) | ORAL | Status: DC

## 2015-07-23 MED ORDER — ONDANSETRON HCL 4 MG/2ML IJ SOLN: 4.0000 mg | Freq: Four times a day (QID) | INTRAMUSCULAR | Status: DC | PRN

## 2015-07-23 MED ORDER — POTASSIUM CHLORIDE 10 MEQ/100ML IV SOLN
10.0000 meq | INTRAVENOUS | Status: AC
  Administered 2015-07-23 – 2015-07-24 (×4): 10 meq via INTRAVENOUS
  Filled 2015-07-23 (×4): qty 100

## 2015-07-23 NOTE — ED Notes (Addendum)
Pt reports having left knee pain, hx of same. Denies new injury to leg or knee. Pt appears lethargic at triage, denies etoh use, states he is fatigued due to knee pain. Airway intact at triage.

## 2015-07-23 NOTE — ED Provider Notes (Signed)
CSN: [id number]     Arrival date & time 07/23/15  1635 History   First MD Initiated Contact with Patient 07/23/15 1901     Chief Complaint  Patient presents with  . Knee Pain  . Altered Mental Status     HPI 60 year old male who presented to the ED with complaint of left knee pain, which he has a history of. While in triage patient was noted to be somnolent. He denied alcohol use or drug use, though has history of alcoholism, cirrhosis, hepatic encephalopathy, and hepatitis C. He is brought back to the ED, which point he was noted to be very somnolent. Reports pain in "both of my legs." He is oriented only to person. He is unable to give any further history and falls asleep in between questioning. Awake awakens only to shouting or sternal rub.  Past Medical History  Diagnosis Date  . Hypertension   . Hepatitis C   . Alcohol abuse   . Ascites   . Cirrhosis ([place])   . Shortness of breath     with exertion  . Depression   . Alcoholism ([place]) 07/12/2015   Past Surgical History  Procedure Laterality Date  . Colonoscopy  march 2015    Dr. Renee Harder: nodular mucosa at appendiceal orifice, tubular adenoma, extremely poor prep  . Circumcision     Family History  Problem Relation Age of Onset  . Breast cancer Mother   . Hypertension Mother   . Diabetes Father   . Hypertension Sister   . Heart disease Sister   . Hypertension Paternal Grandmother   . Colon cancer Neg Hx    Social History  Substance Use Topics  . Smoking status: Light Tobacco Smoker -- 0.50 packs/day    Types: Cigarettes  . Smokeless tobacco: Never Used     Comment: STATES HE SMOKES 2-3 TIMES MONTHLY  . Alcohol Use: Yes     Comment: A couple beers every few days     Review of Systems  Unable to perform ROS: Mental status change      Allergies  Review of patient's allergies indicates no known allergies.  Home Medications   Prior to Admission medications   Medication Sig Start Date End Date Taking?  Authorizing Provider  furosemide (LASIX) 40 MG tablet Take 1.5 tablets (60 mg total) by mouth 2 (two) times daily. 10/13/13   Lance Bosch, NP  lactulose (CHRONULAC) 10 GM/15ML solution Take 45 mLs (30 g total) by mouth 2 (two) times daily. Patient taking differently: Take 30 g by mouth daily.  06/29/15   Delfina Redwood, MD  Multiple Vitamin (MULTIVITAMIN) tablet Take 1 tablet by mouth daily.    Historical Provider, MD  spironolactone (ALDACTONE) 100 MG tablet Take 100 mg by mouth daily. 05/30/15   Historical Provider, MD   BP 126/77 mmHg  Pulse 47  Temp(Src) 97.2 F (36.2 C) (Rectal)  Resp 11  SpO2 99% Physical Exam  Constitutional: He appears well-developed and well-nourished. No distress.  HENT:  Head: Normocephalic and atraumatic.  Right Ear: External ear normal.  Left Ear: External ear normal.  Nose: Nose normal.  Mouth/Throat: Oropharynx is clear and moist. No oropharyngeal exudate.  Eyes: Conjunctivae are normal. Right eye exhibits no discharge. Left eye exhibits no discharge. No scleral icterus.  Pupils pinpoint bilaterally  Neck: Normal range of motion. Neck supple. No tracheal deviation present.  Cardiovascular: Normal rate, regular rhythm and normal heart sounds.  Exam reveals no gallop and no friction rub.  No murmur heard. Pulmonary/Chest: Effort normal and breath sounds normal. No respiratory distress. He has no wheezes. He has no rales.  Abdominal: Soft. Bowel sounds are normal. He exhibits no distension and no mass. There is no tenderness. There is no rebound and no guarding.  Musculoskeletal:  No erythema or edema noted to the bilateral hips, knees, or ankles. Full passive and active ROM.   Neurological:  Somnolent, oriented to person only. Says "yes m'am" to answer every question asked of him. Moves all 4 extremities on command, but will not cooperate with further neurologic testing.   Skin: Skin is warm and dry. No rash noted. He is not diaphoretic.    ED  Course  Procedures  Labs Review Labs Reviewed  CBC WITH DIFFERENTIAL/PLATELET - Abnormal; Notable for the following:    WBC 3.3 (*)    RBC 3.36 (*)    Hemoglobin 12.0 (*)    HCT 35.0 (*)    MCV 104.2 (*)    MCH 35.7 (*)    Platelets 52 (*)    All other components within normal limits  COMPREHENSIVE METABOLIC PANEL - Abnormal; Notable for the following:    Potassium 2.9 (*)    Glucose, Bld 138 (*)    BUN 35 (*)    Creatinine, Ser 1.88 (*)    Calcium 8.1 (*)    Albumin 2.2 (*)    AST 124 (*)    Alkaline Phosphatase 225 (*)    GFR calc non Af Amer 37 (*)    GFR calc Af Amer 43 (*)    All other components within normal limits  AMMONIA - Abnormal; Notable for the following:    Ammonia 264 (*)    All other components within normal limits  URINALYSIS, ROUTINE W REFLEX MICROSCOPIC (NOT AT [facility]) - Abnormal; Notable for the following:    Color, Urine AMBER (*)    Bilirubin Urine SMALL (*)    All other components within normal limits  PROTIME-INR - Abnormal; Notable for the following:    Prothrombin Time 16.2 (*)    All other components within normal limits  CBG MONITORING, ED - Abnormal; Notable for the following:    Glucose-Capillary 203 (*)    All other components within normal limits  I-STAT VENOUS BLOOD GAS, ED - Abnormal; Notable for the following:    pH, Ven 7.520 (*)    pCO2, Ven 29.9 (*)    Bicarbonate 24.4 (*)    All other components within normal limits  URINE CULTURE  CULTURE, BLOOD (ROUTINE X 2)  CULTURE, BLOOD (ROUTINE X 2)  ETHANOL  URINE RAPID DRUG SCREEN, HOSP PERFORMED  LACTIC ACID, PLASMA  LACTIC ACID, PLASMA  PROCALCITONIN  COMPREHENSIVE METABOLIC PANEL  MAGNESIUM  CBC WITH DIFFERENTIAL/PLATELET  BRAIN NATRIURETIC PEPTIDE  MAGNESIUM  I-STAT CG4 LACTIC ACID, ED  I-STAT TROPOININ, ED  I-STAT CG4 LACTIC ACID, ED    Imaging Review Ct Head Wo Contrast  07/23/2015  CLINICAL DATA:  Altered mental status. EXAM: CT HEAD WITHOUT CONTRAST TECHNIQUE:  Contiguous axial images were obtained from the base of the skull through the vertex without intravenous contrast. COMPARISON:  None. FINDINGS: Bony calvarium appears intact. Right maxillary mucous retention cyst is noted. Mild chronic ischemic white matter disease is noted. No mass effect or midline shift is noted. Ventricular size is within normal limits. There is no evidence of mass lesion, hemorrhage or acute infarction. IMPRESSION: Mild chronic ischemic white matter disease. No acute intracranial abnormality seen. Electronically Signed  By: Marijo Conception, M.D.   On: 07/23/2015 20:36   Dg Chest Portable 1 View  07/23/2015  CLINICAL DATA:  Weakness and lethargy for 1 day, history hypertension, cirrhosis, hepatitis-C, alcohol abuse, light smoker EXAM: PORTABLE CHEST 1 VIEW COMPARISON:  Portable exam 1920 hours compared to 06/26/2015 FINDINGS: Upper normal size of cardiac silhouette. Mediastinal contours and pulmonary vascularity normal for technique. Lungs clear. No pleural effusion or pneumothorax. Bones unremarkable. IMPRESSION: No acute abnormalities. Electronically Signed   By: Lavonia Dana M.D.   On: 07/23/2015 19:36   I have personally reviewed and evaluated these images and lab results as part of my medical decision-making.   EKG Interpretation   Date/Time:  Monday July 23 2015 19:24:12 EDT Ventricular Rate:  46 PR Interval:  173 QRS Duration: 106 QT Interval:  642 QTC Calculation: 562 R Axis:   55 Text Interpretation:  Sinus bradycardia Multiple premature complexes, vent  & supraven Nonspecific T abnormalities, lateral leads Prolonged QT  interval Baseline wander in lead(s) V3 Sinus bradycardia Premature atrial  complexes T wave abnormality Abnormal ekg Confirmed by Carmin Muskrat   MD [phone]) on 07/23/2015 9:05:04 PM      MDM   Final diagnoses:  Somnolence  Hepatic encephalopathy ([place])   Patient is somnolent, unable to obtain history on arrival. Continues to protect his  airway. Noted to be moving all 4 extremities. CT head obtained with no acute cranial abnormalities. Doubt CVA. No lactic acidosis. Ammonia elevated to 264 and patient's presentation is compatible with hepatic encephalopathy given his known history of alcohol abuse and cirrhosis. Ethanol level less than 5. UDS negative. We will administer lactulose rectally. Given level of somnolence, will admit to stepdown unit for management of the patient's hepatic encephalopathy. He remained hemodynamically stable while in the ED. Fluids given for AKI with creatinine of 1.88.    Rune Mendez Algernon Huxley, MD 07/24/15 [id number]  Carmin Muskrat, MD 07/24/15 [phone]

## 2015-07-23 NOTE — ED Notes (Signed)
Pt more active after narcan but not more alert, pupils more reactive.

## 2015-07-23 NOTE — H&P (Signed)
[facility] History and Physical  Jose [name] [id number] DOB: 1955-08-09 DOA: 07/23/2015  Referring physician: ED physician PCP: Carmie Kanner, NP  Specialists: Dr. Alvy Bimler (oncology)   Chief Complaint: Knee pain   HPI: Jose [name] is a 60 y.o. male with PMH of liver cirrhosis, chronic hepatitis C, alcohol abuse, hypertension, and thrombocytopenia who presents to the ED complaining of left knee pain. Unfortunately, the patient was extremely lethargic and no further history could be obtained. He was given a dose of Narcan, but without significant response. Patient had a recent admission for hepatic encephalopathy and ammonia level was checked in the ED. Ona returned elevated at 264 and lactulose was ordered. Patient was noted to be afebrile, saturating well on room air, with bradycardia in the 40s, and blood pressure stable. Labs were notable for a potassium of 2.9, serum creatinine 1.88, up from his apparent baseline is 0.8, white blood cell count of 3300, platelet count of 52,000, and MCV of 104.2. EKG reveals sinus bradycardia with QTc of 562 ms. An attempt was made to place NG tube for administration of lactulose, but this was unsuccessful. Patient was bolused with 1 L of normal saline, blood cultures were obtained, and he will be admitted to the hospital for ongoing evaluation and management of lethargy suspected secondary to hepatic encephalopathy.  Where does patient live?   At home     Can patient participate in ADLs?  Yes      Review of Systems:  Unable to obtain ROS secondary to patient's clinical condition with acute encephalopathy.     Allergy: No Known Allergies  Past Medical History  Diagnosis Date  . Hypertension   . Hepatitis C   . Alcohol abuse   . Ascites   . Cirrhosis ([place])   . Shortness of breath     with exertion  . Depression   . Alcoholism ([place]) 07/12/2015    Past Surgical History  Procedure Laterality Date  . Colonoscopy  march 2015    Dr.  Renee Harder: nodular mucosa at appendiceal orifice, tubular adenoma, extremely poor prep  . Circumcision      Social History:  reports that he has been smoking Cigarettes.  He has been smoking about 0.50 packs per day. He has never used smokeless tobacco. He reports that he drinks alcohol. He reports that he uses illicit drugs (Marijuana and Cocaine).  Family History:  Family History  Problem Relation Age of Onset  . Breast cancer Mother   . Hypertension Mother   . Diabetes Father   . Hypertension Sister   . Heart disease Sister   . Hypertension Paternal Grandmother   . Colon cancer Neg Hx      Prior to Admission medications   Medication Sig Start Date End Date Taking? Authorizing Provider  furosemide (LASIX) 40 MG tablet Take 1.5 tablets (60 mg total) by mouth 2 (two) times daily. 10/13/13   Lance Bosch, NP  lactulose (CHRONULAC) 10 GM/15ML solution Take 45 mLs (30 g total) by mouth 2 (two) times daily. Patient taking differently: Take 30 g by mouth daily.  06/29/15   Delfina Redwood, MD  Multiple Vitamin (MULTIVITAMIN) tablet Take 1 tablet by mouth daily.    Historical Provider, MD  spironolactone (ALDACTONE) 100 MG tablet Take 100 mg by mouth daily. 05/30/15   Historical Provider, MD    Physical Exam: Filed Vitals:   07/23/15 1644 07/23/15 1855 07/23/15 1921  BP: 115/72 122/72 126/77  Pulse: 57 52 47  Temp: 98.7 F (37.1 C) 98 F (36.7 C) 97.2 F (36.2 C)  TempSrc: Oral Oral Rectal  Resp: 14 16 11   SpO2: 99% 100% 99%   General: Not in acute distress. Obtunded HEENT:       Eyes: PERRL, EOMI, no scleral icterus or conjunctival pallor.       ENT: No discharge from the ears or nose, no pharyngeal ulcers, oral mucosa moist.        Neck: No significant JVD, no bruit, no appreciable mass Heme: No cervical adenopathy, no pallor Cardiac: S1/S2, RRR, grade IV systolic murmur throughout precordium, No gallops or rubs. Pulm: Good air movement bilaterally. No rales, wheezing,  rhonchi or rubs. Abd: Soft, nondistended, nontender, no rebound pain or gaurding, BS present. Ext: 1+ pitting edema of b/l LEs. 2+DP/PT pulse bilaterally. Musculoskeletal: No gross deformity, no red, hot, swollen joints  Skin: No rashes or wounds on exposed surfaces  Neuro: Obtunded, withdraws from pain; no gross facial asymmetry; pupils equal, round, sluggishly reactive; patellar DTRs 2+ b/l, Babinski is down-going bilaterally.  Psych: Difficult to assess given the clinical scenario.  Labs on Admission:  Basic Metabolic Panel:  Recent Labs Lab 07/23/15 1920  NA 138  K 2.9*  CL 103  CO2 24  GLUCOSE 138*  BUN 35*  CREATININE 1.88*  CALCIUM 8.1*   Liver Function Tests:  Recent Labs Lab 07/23/15 1920  AST 124*  ALT 58  ALKPHOS 225*  BILITOT 1.2  PROT 6.5  ALBUMIN 2.2*   No results for input(s): LIPASE, AMYLASE in the last 168 hours.  Recent Labs Lab 07/23/15 1900  AMMONIA 264*   CBC:  Recent Labs Lab 07/23/15 1920  WBC 3.3*  NEUTROABS 1.7  HGB 12.0*  HCT 35.0*  MCV 104.2*  PLT 52*   Cardiac Enzymes: No results for input(s): CKTOTAL, CKMB, CKMBINDEX, TROPONINI in the last 168 hours.  BNP (last 3 results) No results for input(s): BNP in the last 8760 hours.  ProBNP (last 3 results) No results for input(s): PROBNP in the last 8760 hours.  CBG:  Recent Labs Lab 07/23/15 1822  GLUCAP 203*    Radiological Exams on Admission: Ct Head Wo Contrast  07/23/2015  CLINICAL DATA:  Altered mental status. EXAM: CT HEAD WITHOUT CONTRAST TECHNIQUE: Contiguous axial images were obtained from the base of the skull through the vertex without intravenous contrast. COMPARISON:  None. FINDINGS: Bony calvarium appears intact. Right maxillary mucous retention cyst is noted. Mild chronic ischemic white matter disease is noted. No mass effect or midline shift is noted. Ventricular size is within normal limits. There is no evidence of mass lesion, hemorrhage or acute  infarction. IMPRESSION: Mild chronic ischemic white matter disease. No acute intracranial abnormality seen. Electronically Signed   By: Marijo Conception, M.D.   On: 07/23/2015 20:36   Dg Chest Portable 1 View  07/23/2015  CLINICAL DATA:  Weakness and lethargy for 1 day, history hypertension, cirrhosis, hepatitis-C, alcohol abuse, light smoker EXAM: PORTABLE CHEST 1 VIEW COMPARISON:  Portable exam 1920 hours compared to 06/26/2015 FINDINGS: Upper normal size of cardiac silhouette. Mediastinal contours and pulmonary vascularity normal for technique. Lungs clear. No pleural effusion or pneumothorax. Bones unremarkable. IMPRESSION: No acute abnormalities. Electronically Signed   By: Lavonia Dana M.D.   On: 07/23/2015 19:36    EKG: Independently reviewed.  Abnormal findings: Sinus bradycardia (rate 46), QTc 562 ms    Assessment/Plan  1. Hepatic encephalopathy  - Ammonia is 264 on admission and pt is  obtunded  - Head CT negative for acute intracranial findings - Lactulose 30 mg TID, titrated to achieve 2-3 loose stools daily   2. Cirrhosis d/t chronic Hep C, alcoholism  - No ascites appreciated on exam  - MELD score 16, corresponding to an estimated 6% 3-mo mortality  - Hep C has not been treated  - Management of suspected encephalopathy as above  - No apparent history of GIB or SBP   3. Alcohol abuse  - EtOH level <5 on arrival  - Monitor with CIWA, prn Ativan  - Counsel towards cessation   4. Bradycardia, prolonged QT interval  - HR in 40s, seems to be a longstanding problem per chart review  - Bradycardia would likely preclude use of propanolol for variceal bleed ppx  - QTc is 562 on admission EKG; hypokalemia likely contributes to this, replacing  - Monitor on telemetry; avoid AV-blocking agents, drugs that would further prolong QT (eg. Antiemetics, narcotics)   5. Acute kidney injury  - SCr 1.88 on admission, up from apparent baseline of 0.8  - Uncertain etiology, suspected to  represent a prerenal azotemia - Hydrating with banana bag overnight  - Repeat chem panel in am   6. Pancytopenia  - WBC 3,300, Hgb 12.0, platelets 52,000 on admission  - Evaluated by heme-onc in past and attributed to liver disease at that time  - No sign of active blood loss or petechia  - No pharmacologic VTE ppx ordered; SCD only    7. Hypokalemia  - Potassium 2.9 on admission, uncertain etiology  - Replacing IV  - Check mag level and replete prn    DVT ppx:  SCDs  Code Status: Full code Family Communication: None at bed side.                Disposition Plan: Admit to inpatient   Date of Service 07/23/2015    Vianne Bulls, MD [facility] Pager [phone]  If 7PM-7AM, please contact night-coverage [email or web address] Password [facility] 07/23/2015, 10:54 PM

## 2015-07-24 ENCOUNTER — Inpatient Hospital Stay (HOSPITAL_COMMUNITY): Payer: Medicaid Other

## 2015-07-24 DIAGNOSIS — D638 Anemia in other chronic diseases classified elsewhere: Secondary | ICD-10-CM

## 2015-07-24 DIAGNOSIS — K7031 Alcoholic cirrhosis of liver with ascites: Secondary | ICD-10-CM

## 2015-07-24 LAB — CBC WITH DIFFERENTIAL/PLATELET
BASOS ABS: 0 10*3/uL (ref 0.0–0.1)
BASOS PCT: 1 %
EOS ABS: 0.1 10*3/uL (ref 0.0–0.7)
Eosinophils Relative: 2 %
HEMATOCRIT: 35.3 % — AB (ref 39.0–52.0)
HEMOGLOBIN: 11.9 g/dL — AB (ref 13.0–17.0)
Lymphocytes Relative: 31 %
Lymphs Abs: 1 10*3/uL (ref 0.7–4.0)
MCH: 35.2 pg — ABNORMAL HIGH (ref 26.0–34.0)
MCHC: 33.7 g/dL (ref 30.0–36.0)
MCV: 104.4 fL — ABNORMAL HIGH (ref 78.0–100.0)
Monocytes Absolute: 0.4 10*3/uL (ref 0.1–1.0)
Monocytes Relative: 11 %
NEUTROS ABS: 1.8 10*3/uL (ref 1.7–7.7)
NEUTROS PCT: 55 %
PLATELETS: 59 10*3/uL — AB (ref 150–400)
RBC: 3.38 MIL/uL — ABNORMAL LOW (ref 4.22–5.81)
RDW: 14 % (ref 11.5–15.5)
WBC: 3.3 10*3/uL — AB (ref 4.0–10.5)

## 2015-07-24 LAB — COMPREHENSIVE METABOLIC PANEL
ALK PHOS: 189 U/L — AB (ref 38–126)
ALT: 56 U/L (ref 17–63)
ANION GAP: 9 (ref 5–15)
AST: 114 U/L — ABNORMAL HIGH (ref 15–41)
Albumin: 1.9 g/dL — ABNORMAL LOW (ref 3.5–5.0)
BILIRUBIN TOTAL: 1.7 mg/dL — AB (ref 0.3–1.2)
BUN: 30 mg/dL — ABNORMAL HIGH (ref 6–20)
CALCIUM: 8.2 mg/dL — AB (ref 8.9–10.3)
CO2: 24 mmol/L (ref 22–32)
CREATININE: 1.41 mg/dL — AB (ref 0.61–1.24)
Chloride: 108 mmol/L (ref 101–111)
GFR, EST NON AFRICAN AMERICAN: 53 mL/min — AB (ref >60)
Glucose, Bld: 121 mg/dL — ABNORMAL HIGH (ref 65–99)
Potassium: 3.1 mmol/L — ABNORMAL LOW (ref 3.5–5.1)
SODIUM: 141 mmol/L (ref 135–145)
TOTAL PROTEIN: 6.1 g/dL — AB (ref 6.5–8.1)

## 2015-07-24 LAB — AMMONIA: Ammonia: 130 umol/L — ABNORMAL HIGH (ref 9–35)

## 2015-07-24 LAB — POTASSIUM: Potassium: 3.1 mmol/L — ABNORMAL LOW (ref 3.5–5.1)

## 2015-07-24 LAB — GLUCOSE, CAPILLARY: GLUCOSE-CAPILLARY: 171 mg/dL — AB (ref 65–99)

## 2015-07-24 LAB — CBG MONITORING, ED: GLUCOSE-CAPILLARY: 141 mg/dL — AB (ref 65–99)

## 2015-07-24 LAB — MAGNESIUM: MAGNESIUM: 2.4 mg/dL (ref 1.7–2.4)

## 2015-07-24 LAB — LACTIC ACID, PLASMA: Lactic Acid, Venous: 1.5 mmol/L (ref 0.5–2.0)

## 2015-07-24 LAB — PROCALCITONIN: Procalcitonin: 0.23 ng/mL

## 2015-07-24 LAB — BRAIN NATRIURETIC PEPTIDE: B NATRIURETIC PEPTIDE 5: 39.8 pg/mL (ref 0.0–100.0)

## 2015-07-24 MED ORDER — PANTOPRAZOLE SODIUM 40 MG PO TBEC
40.0000 mg | DELAYED_RELEASE_TABLET | Freq: Every day | ORAL | Status: DC
  Administered 2015-07-24 – 2015-07-27 (×4): 40 mg via ORAL
  Filled 2015-07-24 (×4): qty 1

## 2015-07-24 MED ORDER — VITAMIN B-1 100 MG PO TABS
100.0000 mg | ORAL_TABLET | Freq: Every day | ORAL | Status: DC
  Administered 2015-07-24 – 2015-07-29 (×6): 100 mg via ORAL
  Filled 2015-07-24 (×6): qty 1

## 2015-07-24 MED ORDER — POTASSIUM CHLORIDE 10 MEQ/100ML IV SOLN
10.0000 meq | INTRAVENOUS | Status: AC
  Administered 2015-07-24 (×3): 10 meq via INTRAVENOUS
  Filled 2015-07-24 (×3): qty 100

## 2015-07-24 MED ORDER — LACTULOSE 10 GM/15ML PO SOLN
20.0000 g | Freq: Two times a day (BID) | ORAL | Status: DC
  Administered 2015-07-24 – 2015-07-29 (×11): 20 g via ORAL
  Filled 2015-07-24 (×12): qty 30

## 2015-07-24 MED ORDER — FOLIC ACID 1 MG PO TABS
1.0000 mg | ORAL_TABLET | Freq: Every day | ORAL | Status: DC
  Administered 2015-07-24 – 2015-07-29 (×6): 1 mg via ORAL
  Filled 2015-07-24 (×6): qty 1

## 2015-07-24 NOTE — ED Notes (Signed)
Attempted Report x1.   

## 2015-07-24 NOTE — ED Notes (Signed)
CBG - 141 ° °

## 2015-07-24 NOTE — ED Notes (Signed)
Pt to be transported upstairs by [employer], Therapist, sports

## 2015-07-24 NOTE — Progress Notes (Addendum)
Patient ID: Jose [name], male   DOB: Mar 10, 1956, 60 y.o.   MRN: [id number] [facility] PROGRESS NOTE  Jose [name] [id number] DOB: Mar 10, 1956 DOA: 07/23/2015 PCP: Carmie Kanner, NP  Brief narrative:    60 y.o. male with past medical history of chronic hepatitis C, alcohol abuse, hypertension who presented to [facility] with reports of worsening left knee pain. He was lethargic on admission. Blood ammonia level was 264. UDS was WNL. Potassium as 2.9 and was supplemented. Alcohol level was within normal limits. He was started on CIWA protocol.  Assessment/Plan:    Principal Problem:   Hepatic encephalopathy ([place]) - Likely related to liver cirrhosis and related hepatitis C and alcohol abuse - Ammonia level 264 the admission and improving with lactulose. Because of lethargy he was given enema lactulose in ED. We changed this to by mouth lactulose this morning because he is more alert and oriented  Active Problems:   Left knee pain - Obtain portable x-ray for evaluation    Liver cirrhosis with ascites - Abdomen is softly distended. He has stable respiratory status. He does not complain of abdominal pain. We'll continue to monitor abdominal distention. For now he does not require paracentesis. - Continue Lasix, spironolactone - Continue Protonix daily    Essential hypertension - Continue Lasix 60 mg twice daily, Aldactone 100 mg daily    History of alcohol abuse  - He is on CIWA protocol - Alcohol level is within normal limits on this admission - Continue multivitamin, thiamine and folic acid    Acute renal failure superimposed on chronic kidney disease stage IV - Recent creatinine baseline about 4 weeks ago was 1.7 and on this admission 1.88 - Likely related to his altered mental status, dehydration - We have continued Lasix from the time of the admission and his creatinine is actually improving, 1.41    Thrombocytopenia, unspecified (HCC) / anemia of chronic  disease / leukopenia - Related to bone marrow suppression from chronic liver cirrhosis - Platelets are 59, hemoglobin 11.9 and white blood cell count 3.3 this morning    Hypokalemia - Secondary to acute hepatic encephalopathy, lethargy - Supplemented - Magnesium level within normal limits  DVT Prophylaxis  - SCDs bilaterally because of thrombocytopenia   Code Status: Full.  Family Communication:  plan of care discussed with the patient Disposition Plan: Home likely by 07/26/2015  IV access:  Peripheral IV  Procedures and diagnostic studies:    Ct Head Wo Contrast 07/23/2015   Mild chronic ischemic white matter disease. No acute intracranial abnormality seen.   Dg Chest Portable 1 View 07/23/2015  No acute abnormalities.  Medical Consultants:  None   Other Consultants:  None   IAnti-Infectives:   None    Leisa Lenz, MD  [facility] Pager [phone]  Time spent in minutes: 25 minutes  If 7PM-7AM, please contact night-coverage [email or web address] Password [facility] 07/24/2015, 12:35 PM   LOS: 1 day    HPI/Subjective: No acute overnight events. Patient reports he feels better.   Objective: Filed Vitals:   07/24/15 1045 07/24/15 1130 07/24/15 1215 07/24/15 1218  BP: 128/89 137/92  130/76  Pulse: 50 56 55 56  Temp:      TempSrc:      Resp: 17 19 19 17   SpO2: 100% 99% 100% 100%    Intake/Output Summary (Last 24 hours) at 07/24/15 1235 Last data filed at 07/24/15 1225  Gross per 24 hour  Intake   1406 ml  Output  550 ml  Net    856 ml    Exam:   General:  Pt is alert, follows commands appropriately, not in acute distress  Cardiovascular: Regular rate and rhythm, S1/S2 appreciated   Respiratory: Clear to auscultation bilaterally, no wheezing, no crackles, no rhonchi  Abdomen: Soft, non tender, non distended, bowel sounds present  Extremities: No edema, pulses palpable bilaterally  Neuro: Grossly nonfocal  Data Reviewed: Basic Metabolic  Panel:  Recent Labs Lab 07/23/15 1920 07/24/15 0230 07/24/15 0732  NA 138 141  --   K 2.9* 3.1* 3.1*  CL 103 108  --   CO2 24 24  --   GLUCOSE 138* 121*  --   BUN 35* 30*  --   CREATININE 1.88* 1.41*  --   CALCIUM 8.1* 8.2*  --   MG  --  2.4  --    Liver Function Tests:  Recent Labs Lab 07/23/15 1920 07/24/15 0230  AST 124* 114*  ALT 58 56  ALKPHOS 225* 189*  BILITOT 1.2 1.7*  PROT 6.5 6.1*  ALBUMIN 2.2* 1.9*   No results for input(s): LIPASE, AMYLASE in the last 168 hours.  Recent Labs Lab 07/23/15 1900 07/24/15 0230  AMMONIA 264* 130*   CBC:  Recent Labs Lab 07/23/15 1920 07/24/15 0230  WBC 3.3* 3.3*  NEUTROABS 1.7 1.8  HGB 12.0* 11.9*  HCT 35.0* 35.3*  MCV 104.2* 104.4*  PLT 52* 59*   Cardiac Enzymes: No results for input(s): CKTOTAL, CKMB, CKMBINDEX, TROPONINI in the last 168 hours. BNP: Invalid input(s): POCBNP CBG:  Recent Labs Lab 07/23/15 1822 07/24/15 0925  GLUCAP 203* 141*    Recent Results (from the past 240 hour(s))  Blood culture (routine x 2)     Status: None (Preliminary result)   Collection Time: 07/23/15  7:20 PM  Result Value Ref Range Status   Specimen Description BLOOD RIGHT ANTECUBITAL  Final   Special Requests BOTTLES DRAWN AEROBIC AND ANAEROBIC 5CC  Final   Culture NO GROWTH < 24 HOURS  Final   Report Status PENDING  Incomplete  Urine culture     Status: None (Preliminary result)   Collection Time: 07/23/15  7:30 PM  Result Value Ref Range Status   Specimen Description URINE, CATHETERIZED  Final   Special Requests NONE  Final   Culture NO GROWTH < 24 HOURS  Final   Report Status PENDING  Incomplete  Blood culture (routine x 2)     Status: None (Preliminary result)   Collection Time: 07/23/15  7:40 PM  Result Value Ref Range Status   Specimen Description BLOOD LEFT ANTECUBITAL  Final   Special Requests BOTTLES DRAWN AEROBIC AND ANAEROBIC 5CC  Final   Culture NO GROWTH < 24 HOURS  Final   Report Status PENDING   Incomplete     Scheduled Meds: . folic acid  1 mg Oral Daily  . furosemide  60 mg Oral BID  . multivitamin with minerals  1 tablet Oral Daily  . sodium chloride flush  3 mL Intravenous Q12H  . spironolactone  100 mg Oral Daily  . thiamine  100 mg Oral Daily   Continuous Infusions: . potassium chloride 10 mEq (07/24/15 1234)

## 2015-07-24 NOTE — ED Notes (Signed)
Attempted Report x2. 

## 2015-07-24 NOTE — ED Notes (Signed)
Regular lunch tray ordered 

## 2015-07-24 NOTE — ED Notes (Signed)
Ordered Reg tray

## 2015-07-25 DIAGNOSIS — N179 Acute kidney failure, unspecified: Secondary | ICD-10-CM | POA: Diagnosis present

## 2015-07-25 DIAGNOSIS — K7031 Alcoholic cirrhosis of liver with ascites: Secondary | ICD-10-CM | POA: Diagnosis present

## 2015-07-25 DIAGNOSIS — N184 Chronic kidney disease, stage 4 (severe): Secondary | ICD-10-CM

## 2015-07-25 DIAGNOSIS — M25562 Pain in left knee: Secondary | ICD-10-CM

## 2015-07-25 LAB — URINE CULTURE: Culture: NO GROWTH

## 2015-07-25 LAB — GLUCOSE, CAPILLARY: Glucose-Capillary: 123 mg/dL — ABNORMAL HIGH (ref 65–99)

## 2015-07-25 LAB — MRSA PCR SCREENING: MRSA by PCR: NEGATIVE

## 2015-07-25 NOTE — Progress Notes (Signed)
Patient ID: Jose [name], male   DOB: [birth year], 60 y.o.   MRN: [id number] [facility] PROGRESS NOTE  Jose [name] [id number] DOB: 09-12-1955 DOA: 07/23/2015 PCP: Carmie Kanner, NP  Brief narrative:    60 y.o. male with past medical history of chronic hepatitis C, alcohol abuse, hypertension who presented to [facility] with reports of worsening left knee pain. He was lethargic on admission. Blood ammonia level was 264. UDS was WNL. Potassium as 2.9 and was supplemented. Alcohol level was within normal limits. He was started on CIWA protocol.  Assessment/Plan:    Principal Problem:   Hepatic encephalopathy ([place]) - Likely related to liver cirrhosis and related hepatitis C and alcohol abuse - Ammonia level 264 the admission and has improved with lactulose.  - Transfer to telemetry floor today.   Active Problems:   Left knee pain - No acute findings on the x ray     Liver cirrhosis with ascites - Continue Lasix, spironolactone - Continue Protonix daily    Essential hypertension - Continue Lasix 60 mg twice daily, Aldactone 100 mg daily    History of alcohol abuse  - On CIWA protocol - No reports of withdrawals  - Alcohol level is within normal limits on this admission - Continue multivitamin, thiamine and folic acid    Acute renal failure superimposed on chronic kidney disease stage IV - Recent creatinine baseline about 4 weeks ago was 1.7 and on this admission 1.88 - Cr improving with IV fluids, 1.44 on 07/24/2015 - Of note, we have continued Lasix from the time of the admission and his creatinine is improving    Thrombocytopenia, unspecified (HCC) / anemia of chronic disease / leukopenia - Related to bone marrow suppression from chronic liver cirrhosis - Check CBC in am    Hypokalemia - Secondary to acute hepatic encephalopathy, lethargy - Supplemented - Magnesium level within normal limits - Check BMP in am  DVT Prophylaxis  - SCDs bilaterally due  to thrombocytopenia   Code Status: Full.  Family Communication:  plan of care discussed with the patient Disposition Plan: Transfer to telemetry   IV access:  Peripheral IV  Procedures and diagnostic studies:    Ct Head Wo Contrast 07/23/2015   Mild chronic ischemic white matter disease. No acute intracranial abnormality seen.   Dg Chest Portable 1 View 07/23/2015  No acute abnormalities.  Medical Consultants:  None   Other Consultants:  None   IAnti-Infectives:   None    Leisa Lenz, MD  [facility] Pager [phone]  Time spent in minutes: 25 minutes  If 7PM-7AM, please contact night-coverage [email or web address] Password [facility] 07/25/2015, 4:22 PM   LOS: 2 days    HPI/Subjective: No acute overnight events. Patient reports knee pain better.   Objective: Filed Vitals:   07/25/15 0800 07/25/15 1100 07/25/15 1116 07/25/15 1514  BP: 121/76 119/65 119/65 122/71  Pulse: 54  53 49  Temp:   98 F (36.7 C) 98.6 F (37 C)  TempSrc:   Oral Oral  Resp: 19 20 18 18   Height:    5\' 6"  (1.676 m)  Weight:    83.507 kg (184 lb 1.6 oz)  SpO2: 100%  100% 100%    Intake/Output Summary (Last 24 hours) at 07/25/15 1622 Last data filed at 07/25/15 1519  Gross per 24 hour  Intake    600 ml  Output   1551 ml  Net   -951 ml    Exam:   General:  Pt  is alert, not in acute distress  Cardiovascular: RRR, S1/S2 (+)  Respiratory: No wheezing, no crackles, no rhonchi  Abdomen: (+) BS, non tender   Extremities: No swelling, palpable pulses   Neuro: Non-focal  Data Reviewed: Basic Metabolic Panel:  Recent Labs Lab 07/23/15 1920 07/24/15 0230 07/24/15 0732  NA 138 141  --   K 2.9* 3.1* 3.1*  CL 103 108  --   CO2 24 24  --   GLUCOSE 138* 121*  --   BUN 35* 30*  --   CREATININE 1.88* 1.41*  --   CALCIUM 8.1* 8.2*  --   MG  --  2.4  --    Liver Function Tests:  Recent Labs Lab 07/23/15 1920 07/24/15 0230  AST 124* 114*  ALT 58 56  ALKPHOS 225* 189*   BILITOT 1.2 1.7*  PROT 6.5 6.1*  ALBUMIN 2.2* 1.9*   No results for input(s): LIPASE, AMYLASE in the last 168 hours.  Recent Labs Lab 07/23/15 1900 07/24/15 0230  AMMONIA 264* 130*   CBC:  Recent Labs Lab 07/23/15 1920 07/24/15 0230  WBC 3.3* 3.3*  NEUTROABS 1.7 1.8  HGB 12.0* 11.9*  HCT 35.0* 35.3*  MCV 104.2* 104.4*  PLT 52* 59*   Cardiac Enzymes: No results for input(s): CKTOTAL, CKMB, CKMBINDEX, TROPONINI in the last 168 hours. BNP: Invalid input(s): POCBNP CBG:  Recent Labs Lab 07/23/15 1822 07/24/15 0925 07/24/15 2141 07/25/15 0745  GLUCAP 203* 141* 171* 123*    Recent Results (from the past 240 hour(s))  Blood culture (routine x 2)     Status: None (Preliminary result)   Collection Time: 07/23/15  7:20 PM  Result Value Ref Range Status   Specimen Description BLOOD RIGHT ANTECUBITAL  Final   Special Requests BOTTLES DRAWN AEROBIC AND ANAEROBIC 5CC  Final   Culture NO GROWTH 2 DAYS  Final   Report Status PENDING  Incomplete  Urine culture     Status: None   Collection Time: 07/23/15  7:30 PM  Result Value Ref Range Status   Specimen Description URINE, CATHETERIZED  Final   Special Requests NONE  Final   Culture NO GROWTH 1 DAY  Final   Report Status 07/25/2015 FINAL  Final  Blood culture (routine x 2)     Status: None (Preliminary result)   Collection Time: 07/23/15  7:40 PM  Result Value Ref Range Status   Specimen Description BLOOD LEFT ANTECUBITAL  Final   Special Requests BOTTLES DRAWN AEROBIC AND ANAEROBIC 5CC  Final   Culture NO GROWTH 2 DAYS  Final   Report Status PENDING  Incomplete  MRSA PCR Screening     Status: None   Collection Time: 07/24/15  8:03 PM  Result Value Ref Range Status   MRSA by PCR NEGATIVE NEGATIVE Final    Comment:        The GeneXpert MRSA Assay (FDA approved for NASAL specimens only), is one component of a comprehensive MRSA colonization surveillance program. It is not intended to diagnose MRSA infection nor  to guide or monitor treatment for MRSA infections.      Scheduled Meds: . folic acid  1 mg Oral Daily  . furosemide  60 mg Oral BID  . lactulose  20 g Oral BID  . multivitamin with minerals  1 tablet Oral Daily  . pantoprazole  40 mg Oral Daily  . sodium chloride flush  3 mL Intravenous Q12H  . spironolactone  100 mg Oral Daily  . thiamine  100 mg Oral Daily   Continuous Infusions:

## 2015-07-25 NOTE — Progress Notes (Signed)
Nutrition Brief Note  RD received consult for probable malnutrition.   Wt Readings from Last 15 Encounters:  07/25/15 184 lb 1.4 oz (83.5 kg)  07/12/15 190 lb 6.4 oz (86.365 kg)  06/27/15 173 lb 8 oz (78.7 kg)  11/04/13 201 lb (91.173 kg)  10/18/13 198 lb (89.812 kg)  10/13/13 205 lb 9.6 oz (93.26 kg)  07/13/13 199 lb 12.8 oz (90.629 kg)  06/06/13 212 lb (96.163 kg)  04/29/13 217 lb 6.4 oz (98.612 kg)   60 y.o. male with past medical history of chronic hepatitis C, alcohol abuse, hypertension who presented to [facility] with reports of worsening left knee pain. He was lethargic on admission. Blood ammonia level was 264. UDS was WNL. Potassium as 2.9 and was supplemented. Alcohol level was within normal limits. He was started on CIWA protocol.  Pt admitted with hepatic encephalopathy.   Spoke with pt at bedside. He reports he "loves to eat" and generally has a very good appetite. Pt reports he resides in a group home and consumes 3 meals per day. Diet recall is as follows: Breakfast: grits, eggs, sausage, Lunch: hot dogs and hamburgers, and Dinner: meat, starch, and vegetable. Pt reports he consumed 75% of his chicken and rice dinner last night. Breakfast tray at bedside- pt consumed 100% of his breakfast tray, including coke, milk, grits, pancake, and fruit cup.   Nutrition-Focused physical exam completed. Findings are no fat depletion, no muscle depletion, and no edema. Pt reports he has chronic knee pain and often uses a walking stick to assist with mobility.   Wt hx reviewed. Pt with hx of distant wt loss, but denies any recent wt loss. Suspect inaccuracy of weights, as pt's wt has flucuated between 173-190# within the past month. Also suspect lasix and fluid rentention may be contributing to wt fluctuations.   Pt with hx of ETOH abuse. He is currently on MVI, folvite, and MVI supplementation.   Discussed with pt importance of continued good PO intake to promote healing. Pt  denies any further nutritional needs at this time, but expressed appreciation for visit.   Body mass index is 29.73 kg/(m^2). Patient meets criteria for overweight based on current BMI.   Current diet order is regular, patient is consuming approximately 75% of meals at this time. Labs and medications reviewed.   No nutrition interventions warranted at this time. If nutrition issues arise, please consult RD.   Venita Seng A. Jimmye Norman, RD, LDN, CDE Pager: [phone] After hours Pager: [phone]

## 2015-07-25 NOTE — Care Management Note (Signed)
Case Management Note  Patient Details  Name: Jose [name] MRN: [id number] Date of Birth: 1955-06-27  Subjective/Objective:         Adm w hepatic encephalopathy           Action/Plan: lives in grp home, sw ref to be made   Expected Discharge Date:                  Expected Discharge Plan:  [name]  In-House Referral:     Discharge planning Services     Post Acute Care Choice:    Choice offered to:     DME Arranged:    DME Agency:     HH Arranged:    [place] Agency:     Status of Service:     Medicare Important Message Given:    Date Medicare IM Given:    Medicare IM give by:    Date Additional Medicare IM Given:    Additional Medicare Important Message give by:     If discussed at [place] of Stay Meetings, dates discussed:    Additional Comments: ur review done  Lacretia Leigh, RN 07/25/2015, 8:09 AM

## 2015-07-26 DIAGNOSIS — N184 Chronic kidney disease, stage 4 (severe): Secondary | ICD-10-CM

## 2015-07-26 LAB — BASIC METABOLIC PANEL
Anion gap: 7 (ref 5–15)
BUN: 11 mg/dL (ref 6–20)
CHLORIDE: 108 mmol/L (ref 101–111)
CO2: 23 mmol/L (ref 22–32)
CREATININE: 1.01 mg/dL (ref 0.61–1.24)
Calcium: 7.9 mg/dL — ABNORMAL LOW (ref 8.9–10.3)
GFR calc Af Amer: >60 mL/min (ref >60)
GFR calc non Af Amer: >60 mL/min (ref >60)
Glucose, Bld: 138 mg/dL — ABNORMAL HIGH (ref 65–99)
POTASSIUM: 3.1 mmol/L — AB (ref 3.5–5.1)
SODIUM: 138 mmol/L (ref 135–145)

## 2015-07-26 LAB — CBC
HEMATOCRIT: 31.6 % — AB (ref 39.0–52.0)
Hemoglobin: 10.7 g/dL — ABNORMAL LOW (ref 13.0–17.0)
MCH: 35.3 pg — AB (ref 26.0–34.0)
MCHC: 33.9 g/dL (ref 30.0–36.0)
MCV: 104.3 fL — AB (ref 78.0–100.0)
PLATELETS: 47 10*3/uL — AB (ref 150–400)
RBC: 3.03 MIL/uL — AB (ref 4.22–5.81)
RDW: 13.9 % (ref 11.5–15.5)
WBC: 3.4 10*3/uL — AB (ref 4.0–10.5)

## 2015-07-26 LAB — GLUCOSE, CAPILLARY: Glucose-Capillary: 134 mg/dL — ABNORMAL HIGH (ref 65–99)

## 2015-07-26 MED ORDER — POTASSIUM CHLORIDE CRYS ER 20 MEQ PO TBCR
40.0000 meq | EXTENDED_RELEASE_TABLET | Freq: Once | ORAL | Status: AC
  Administered 2015-07-26: 40 meq via ORAL
  Filled 2015-07-26: qty 2

## 2015-07-26 NOTE — Plan of Care (Signed)
Problem: Safety: Goal: Ability to remain free from injury will improve Outcome: Progressing Patient alert and oriented. Patient has bed alarm on and calls out for help when needed.

## 2015-07-26 NOTE — Progress Notes (Signed)
Patient ID: Jose [name], male   DOB: [birth year], 61 y.o.   MRN: [id number] [facility] PROGRESS NOTE  Jose [name] [id number] DOB: Oct 30, 1955 DOA: 07/23/2015 PCP: Carmie Kanner, NP  Brief narrative:    60 y.o. male with past medical history of chronic hepatitis C, alcohol abuse, hypertension who presented to [facility] with reports of worsening left knee pain. He was lethargic on admission. Blood ammonia level was 264. UDS was WNL. Potassium as 2.9 and was supplemented. Alcohol level was within normal limits. He was started on CIWA protocol.  Assessment/Plan:    Principal Problem:   Hepatic encephalopathy ([place]) - Likely related to liver cirrhosis and related hepatitis C and alcohol abuse - Ammonia level 264 the admission  - Improved with lactulose.   Active Problems:   Left knee pain - No acute findings on the x ray  - PT eval in progress    Liver cirrhosis with ascites - Continue Lasix, spironolactone - Continue Protonix daily    Essential hypertension - Continue Lasix 60 mg twice daily, Aldactone 100 mg daily - BP 112/67    History of alcohol abuse  - On CIWA protocol - No withdrawals  - Alcohol level is WNL - Continue multivitamin, thiamine and folic acid    Acute renal failure superimposed on chronic kidney disease stage IV - Recent creatinine baseline about 4 weeks ago was 1.7 and on this admission 1.88 - Cr normalized with IV fluids     Thrombocytopenia, unspecified (HCC) / anemia of chronic disease / leukopenia - Related to bone marrow suppression from chronic liver cirrhosis - Counts stable - No reports of bleeding     Hypokalemia - Secondary to acute hepatic encephalopathy, lethargy - Supplemented - Magnesium level WNL  DVT Prophylaxis  - SCDs bilaterally due to thrombocytopenia   Code Status: Full.  Family Communication:  plan of care discussed with the patient Disposition Plan: Needs PT eval, home likely 3/31  IV access:   Peripheral IV  Procedures and diagnostic studies:    Ct Head Wo Contrast 07/23/2015   Mild chronic ischemic white matter disease. No acute intracranial abnormality seen.   Dg Chest Portable 1 View 07/23/2015  No acute abnormalities.  Medical Consultants:  None   Other Consultants:  PT  IAnti-Infectives:   None    Leisa Lenz, MD  [facility] Pager [phone]  Time spent in minutes: 15 minutes  If 7PM-7AM, please contact night-coverage [email or web address] Password [facility] 07/26/2015, 3:39 PM   LOS: 3 days    HPI/Subjective: No acute overnight events. He still has knee pain, he says he needs 2 people 2 try to get him up.  Objective: Filed Vitals:   07/26/15 0937 07/26/15 1041 07/26/15 1204 07/26/15 1348  BP: 112/70  109/64 112/67  Pulse: 54  52 51  Temp: 98.2 F (36.8 C)  98.2 F (36.8 C) 97.5 F (36.4 C)  TempSrc: Oral  Oral Axillary  Resp:  18 18 17   Height:      Weight:      SpO2:  100% 100% 100%    Intake/Output Summary (Last 24 hours) at 07/26/15 1539 Last data filed at 07/26/15 1413  Gross per 24 hour  Intake   1080 ml  Output   1101 ml  Net    -21 ml    Exam:   General:  Pt is not in acute distress  Cardiovascular: rate controlled, S1/S2 appreciated   Respiratory: bilateral air entry, no wheezing   Abdomen:  non tender, non distended  Extremities: No edema, pulses palpable   Neuro: No focal deficits   Data Reviewed: Basic Metabolic Panel:  Recent Labs Lab 07/23/15 1920 07/24/15 0230 07/24/15 0732 07/26/15 0300  NA 138 141  --  138  K 2.9* 3.1* 3.1* 3.1*  CL 103 108  --  108  CO2 24 24  --  23  GLUCOSE 138* 121*  --  138*  BUN 35* 30*  --  11  CREATININE 1.88* 1.41*  --  1.01  CALCIUM 8.1* 8.2*  --  7.9*  MG  --  2.4  --   --    Liver Function Tests:  Recent Labs Lab 07/23/15 1920 07/24/15 0230  AST 124* 114*  ALT 58 56  ALKPHOS 225* 189*  BILITOT 1.2 1.7*  PROT 6.5 6.1*  ALBUMIN 2.2* 1.9*   No results for  input(s): LIPASE, AMYLASE in the last 168 hours.  Recent Labs Lab 07/23/15 1900 07/24/15 0230  AMMONIA 264* 130*   CBC:  Recent Labs Lab 07/23/15 1920 07/24/15 0230 07/26/15 0300  WBC 3.3* 3.3* 3.4*  NEUTROABS 1.7 1.8  --   HGB 12.0* 11.9* 10.7*  HCT 35.0* 35.3* 31.6*  MCV 104.2* 104.4* 104.3*  PLT 52* 59* 47*   Cardiac Enzymes: No results for input(s): CKTOTAL, CKMB, CKMBINDEX, TROPONINI in the last 168 hours. BNP: Invalid input(s): POCBNP CBG:  Recent Labs Lab 07/23/15 1822 07/24/15 0925 07/24/15 2141 07/25/15 0745 07/26/15 0547  GLUCAP 203* 141* 171* 123* 134*    Recent Results (from the past 240 hour(s))  Blood culture (routine x 2)     Status: None (Preliminary result)   Collection Time: 07/23/15  7:20 PM  Result Value Ref Range Status   Specimen Description BLOOD RIGHT ANTECUBITAL  Final   Special Requests BOTTLES DRAWN AEROBIC AND ANAEROBIC 5CC  Final   Culture NO GROWTH 3 DAYS  Final   Report Status PENDING  Incomplete  Urine culture     Status: None   Collection Time: 07/23/15  7:30 PM  Result Value Ref Range Status   Specimen Description URINE, CATHETERIZED  Final   Special Requests NONE  Final   Culture NO GROWTH 1 DAY  Final   Report Status 07/25/2015 FINAL  Final  Blood culture (routine x 2)     Status: None (Preliminary result)   Collection Time: 07/23/15  7:40 PM  Result Value Ref Range Status   Specimen Description BLOOD LEFT ANTECUBITAL  Final   Special Requests BOTTLES DRAWN AEROBIC AND ANAEROBIC 5CC  Final   Culture NO GROWTH 3 DAYS  Final   Report Status PENDING  Incomplete  MRSA PCR Screening     Status: None   Collection Time: 07/24/15  8:03 PM  Result Value Ref Range Status   MRSA by PCR NEGATIVE NEGATIVE Final    Comment:        The GeneXpert MRSA Assay (FDA approved for NASAL specimens only), is one component of a comprehensive MRSA colonization surveillance program. It is not intended to diagnose MRSA infection nor to  guide or monitor treatment for MRSA infections.      Scheduled Meds: . folic acid  1 mg Oral Daily  . furosemide  60 mg Oral BID  . lactulose  20 g Oral BID  . multivitamin with minerals  1 tablet Oral Daily  . pantoprazole  40 mg Oral Daily  . potassium chloride  40 mEq Oral Once  . sodium chloride flush  3 mL Intravenous Q12H  . spironolactone  100 mg Oral Daily  . thiamine  100 mg Oral Daily   Continuous Infusions:

## 2015-07-26 NOTE — Evaluation (Signed)
Physical Therapy Evaluation Patient Details Name: Jose [name] MRN: [id number] DOB: 08-Nov-1955 Today's Date: 07/26/2015   History of Present Illness  60 y.o. male with past medical history of chronic hepatitis C, alcohol abuse, hypertension who presented to [facility] with reports of worsening left knee pain.   Clinical Impression  Pt admitted with above diagnosis. Pt currently with functional limitations due to the deficits listed below (see PT Problem List). Primary limiting factor is his knee pain. Very stiff, limited ROM and reports a Hx of gout? Eager to work with PT, required min assist to safely mobilize today. Anticipate as his knee pain resolves he will improve functionally as well however we should consider ST SNF due to lack of assistance and group home and fall risk. Pt will benefit from skilled PT to increase their independence and safety with mobility to allow discharge to the venue listed below.       Follow Up Recommendations SNF    Equipment Recommendations  Rolling walker with 5" wheels    Recommendations for Other Services Rehab consult     Precautions / Restrictions Precautions Precautions: Fall Restrictions Weight Bearing Restrictions: No      Mobility  Bed Mobility Overal bed mobility: Modified Independent             General bed mobility comments: extra time  Transfers Overall transfer level: Needs assistance Equipment used: Rolling walker (2 wheeled);Straight cane Transfers: Sit to/from Stand Sit to Stand: Min assist         General transfer comment: Min assist for balance with gait and for boost from bed demonstrating posterior lean and very slow to rise. VC for hand placement with use of RW, better stability with this device.  Ambulation/Gait Ambulation/Gait assistance: Min assist Ambulation Distance (Feet): 25 Feet Assistive device: Rolling walker (2 wheeled) Gait Pattern/deviations: Step-to pattern;Decreased step length -  right;Decreased stance time - left;Decreased weight shift to left;Antalgic;Trunk flexed Gait velocity: slow Gait velocity interpretation: <1.8 ft/sec, indicative of risk for recurrent falls General Gait Details: Educated on safe DME use with a rolling walker. Very slow and guarded with severely antalgic gait pattern.. Cues for walker placement and min assist with turns.  Stairs            Wheelchair Mobility    Modified Rankin (Stroke Patients Only)       Balance Overall balance assessment: Needs assistance Sitting-balance support: No upper extremity supported;Feet supported Sitting balance-Leahy Scale: Normal     Standing balance support: No upper extremity supported Standing balance-Leahy Scale: Fair                               Pertinent Vitals/Pain Pain Assessment: 0-10 Pain Score: 6  Pain Location: left knee Pain Descriptors / Indicators: Aching Pain Intervention(s): Monitored during session;Repositioned    Home Living Family/patient expects to be discharged to:: Group home Living Arrangements: Group Home Available Help at Discharge: Other (Comment) (Minimal to none) Type of Home: Group Home Home Access: Stairs to enter Entrance Stairs-Rails: Right;Left Entrance Stairs-Number of Steps: 3 Home Layout: Two level;Bed/bath upstairs Home Equipment: Cane - single point      Prior Function Level of Independence: Independent with assistive device(s)         Comments: cane for ambulation     Hand Dominance        Extremity/Trunk Assessment   Upper Extremity Assessment: Defer to OT evaluation  Lower Extremity Assessment: LLE deficits/detail   LLE Deficits / Details: Knee painful with flexion and lacks full extension.     Communication   Communication: No difficulties  Cognition Arousal/Alertness: Awake/alert Behavior During Therapy: WFL for tasks assessed/performed Overall Cognitive Status: Within Functional Limits for  tasks assessed                      General Comments General comments (skin integrity, edema, etc.): Denies any falls or recent trauma to Lt knee, states he has had gout in the past and feels similar to this.    Exercises        Assessment/Plan    PT Assessment Patient needs continued PT services  PT Diagnosis Difficulty walking;Abnormality of gait;Generalized weakness;Acute pain   PT Problem List Decreased strength;Decreased range of motion;Decreased activity tolerance;Decreased balance;Decreased mobility;Decreased knowledge of use of DME;Pain  PT Treatment Interventions DME instruction;Gait training;Functional mobility training;Therapeutic activities;Therapeutic exercise;Balance training;Neuromuscular re-education;Stair training;Patient/family education   PT Goals (Current goals can be found in the Care Plan section) Acute Rehab PT Goals Patient Stated Goal: Be independent PT Goal Formulation: With patient Time For Goal Achievement: 08/09/15 Potential to Achieve Goals: Good    Frequency Min 3X/week   Barriers to discharge Decreased caregiver support lives in group home    Co-evaluation               End of Session Equipment Utilized During Treatment: Gait belt Activity Tolerance: Patient tolerated treatment well Patient left: in bed;with call bell/phone within reach;with bed alarm set Nurse Communication: Mobility status         Time: [id number] PT Time Calculation (min) (ACUTE ONLY): 14 min   Charges:   PT Evaluation $PT Eval Low Complexity: 1 Procedure     PT G CodesEllouise Newer 07/26/2015, 4:39 PM Camille Bal [place], [place]

## 2015-07-27 LAB — BASIC METABOLIC PANEL
ANION GAP: 8 (ref 5–15)
BUN: 14 mg/dL (ref 6–20)
CHLORIDE: 110 mmol/L (ref 101–111)
CO2: 22 mmol/L (ref 22–32)
Calcium: 8.4 mg/dL — ABNORMAL LOW (ref 8.9–10.3)
Creatinine, Ser: 1.18 mg/dL (ref 0.61–1.24)
GFR calc Af Amer: >60 mL/min (ref >60)
Glucose, Bld: 117 mg/dL — ABNORMAL HIGH (ref 65–99)
POTASSIUM: 3.8 mmol/L (ref 3.5–5.1)
SODIUM: 140 mmol/L (ref 135–145)

## 2015-07-27 LAB — CBC
HEMATOCRIT: 34.9 % — AB (ref 39.0–52.0)
HEMOGLOBIN: 12 g/dL — AB (ref 13.0–17.0)
MCH: 36 pg — ABNORMAL HIGH (ref 26.0–34.0)
MCHC: 34.4 g/dL (ref 30.0–36.0)
MCV: 104.8 fL — ABNORMAL HIGH (ref 78.0–100.0)
Platelets: 48 10*3/uL — ABNORMAL LOW (ref 150–400)
RBC: 3.33 MIL/uL — AB (ref 4.22–5.81)
RDW: 13.8 % (ref 11.5–15.5)
WBC: 3.4 10*3/uL — AB (ref 4.0–10.5)

## 2015-07-27 LAB — GLUCOSE, CAPILLARY: Glucose-Capillary: 136 mg/dL — ABNORMAL HIGH (ref 65–99)

## 2015-07-27 MED ORDER — PANTOPRAZOLE SODIUM 40 MG IV SOLR
40.0000 mg | Freq: Two times a day (BID) | INTRAVENOUS | Status: DC
  Administered 2015-07-27 – 2015-07-29 (×4): 40 mg via INTRAVENOUS
  Filled 2015-07-27 (×5): qty 40

## 2015-07-27 NOTE — Progress Notes (Signed)
Patient ID: Jose [name], male   DOB: Jan 21, 1956, 60 y.o.   MRN: [id number] [facility] PROGRESS NOTE  Jose [name] [id number] DOB: 02/17/1956 DOA: 07/23/2015 PCP: Carmie Kanner, NP  Brief narrative:    60 y.o. male with past medical history of chronic hepatitis C, alcohol abuse, hypertension who presented to [facility] with reports of worsening left knee pain. He was lethargic on admission. Blood ammonia level was 264. UDS was WNL. Potassium as 2.9 and was supplemented. Alcohol level was within normal limits. He was started on CIWA protocol.  Assessment/Plan:    Principal Problem:   Hepatic encephalopathy ([place]) - Likely related to liver cirrhosis and related hepatitis C and alcohol abuse - Ammonia level 264 the admission  - Mental status better with lactulose   Active Problems:   Left knee pain - No acute findings on the x ray  - Per PT eval - SNF recommended and pt agreeable to d/c to SNF    Liver cirrhosis with ascites - Continue Lasix, spironolactone - Continue PPI therapy     Essential hypertension - Continue Lasix 60 mg twice daily, Aldactone 100 mg daily    History of alcohol abuse  - Continue multivitamin, thiamine and folic acid - No withdrawals    Acute renal failure superimposed on chronic kidney disease stage IV - Recent creatinine baseline about 4 weeks ago was 1.7 and on this admission 1.88 - Cr now WNL    Thrombocytopenia, unspecified (HCC) / anemia of chronic disease / leukopenia - Related to bone marrow suppression from chronic liver cirrhosis - Some bleed per rectum but overall stable - Changed PO protonix to IV protonix Q 12 hours  - Check CBC in am    Hypokalemia - Secondary to acute hepatic encephalopathy, lethargy - Supplemented and WNL  DVT Prophylaxis  - SCDs bilaterally due to thrombocytopenia and risk of bleed    Code Status: Full.  Family Communication:  plan of care discussed with the patient Disposition Plan: to SNF  in next 24 hours provided bed available   IV access:  Peripheral IV  Procedures and diagnostic studies:    Ct Head Wo Contrast 07/23/2015   Mild chronic ischemic white matter disease. No acute intracranial abnormality seen.   Dg Chest Portable 1 View 07/23/2015  No acute abnormalities.  Medical Consultants:  None   Other Consultants:  PT  IAnti-Infectives:   None    Leisa Lenz, MD  [facility] Pager [phone]  Time spent in minutes: 25 minutes  If 7PM-7AM, please contact night-coverage [email or web address] Password [email or web address] 07/27/2015, 4:03 PM   LOS: 4 days    HPI/Subjective: No acute overnight events. Some blood per rectum this am.   Objective: Filed Vitals:   07/26/15 2138 07/27/15 0459 07/27/15 1000 07/27/15 1553  BP: 116/63 136/74 120/69 138/66  Pulse: 50 47 50 58  Temp: 98.7 F (37.1 C) 98.4 F (36.9 C)    TempSrc: Oral Oral    Resp: 18 16  16   Height:      Weight:  83.19 kg (183 lb 6.4 oz)    SpO2: 100% 100%  100%    Intake/Output Summary (Last 24 hours) at 07/27/15 1603 Last data filed at 07/27/15 1500  Gross per 24 hour  Intake   1080 ml  Output    351 ml  Net    729 ml    Exam:   General:  No distress, alert and oriented to time, place and person  Cardiovascular: RRR, appreciate S1, S2   Respiratory: no wheezing, no rhonchi   Abdomen: (+) BS, non tender   Extremities: No swelling, palpable pulses    Neuro: Non focal   Data Reviewed: Basic Metabolic Panel:  Recent Labs Lab 07/23/15 1920 07/24/15 0230 07/24/15 0732 07/26/15 0300 07/27/15 0505  NA 138 141  --  138 140  K 2.9* 3.1* 3.1* 3.1* 3.8  CL 103 108  --  108 110  CO2 24 24  --  23 22  GLUCOSE 138* 121*  --  138* 117*  BUN 35* 30*  --  11 14  CREATININE 1.88* 1.41*  --  1.01 1.18  CALCIUM 8.1* 8.2*  --  7.9* 8.4*  MG  --  2.4  --   --   --    Liver Function Tests:  Recent Labs Lab 07/23/15 1920 07/24/15 0230  AST 124* 114*  ALT 58 56  ALKPHOS 225* 189*   BILITOT 1.2 1.7*  PROT 6.5 6.1*  ALBUMIN 2.2* 1.9*   No results for input(s): LIPASE, AMYLASE in the last 168 hours.  Recent Labs Lab 07/23/15 1900 07/24/15 0230  AMMONIA 264* 130*   CBC:  Recent Labs Lab 07/23/15 1920 07/24/15 0230 07/26/15 0300 07/27/15 0505  WBC 3.3* 3.3* 3.4* 3.4*  NEUTROABS 1.7 1.8  --   --   HGB 12.0* 11.9* 10.7* 12.0*  HCT 35.0* 35.3* 31.6* 34.9*  MCV 104.2* 104.4* 104.3* 104.8*  PLT 52* 59* 47* 48*   Cardiac Enzymes: No results for input(s): CKTOTAL, CKMB, CKMBINDEX, TROPONINI in the last 168 hours. BNP: Invalid input(s): POCBNP CBG:  Recent Labs Lab 07/24/15 0925 07/24/15 2141 07/25/15 0745 07/26/15 0547 07/27/15 0618  GLUCAP 141* 171* 123* 134* 136*    Recent Results (from the past 240 hour(s))  Blood culture (routine x 2)     Status: None (Preliminary result)   Collection Time: 07/23/15  7:20 PM  Result Value Ref Range Status   Specimen Description BLOOD RIGHT ANTECUBITAL  Final   Special Requests BOTTLES DRAWN AEROBIC AND ANAEROBIC 5CC  Final   Culture NO GROWTH 4 DAYS  Final   Report Status PENDING  Incomplete  Urine culture     Status: None   Collection Time: 07/23/15  7:30 PM  Result Value Ref Range Status   Specimen Description URINE, CATHETERIZED  Final   Special Requests NONE  Final   Culture NO GROWTH 1 DAY  Final   Report Status 07/25/2015 FINAL  Final  Blood culture (routine x 2)     Status: None (Preliminary result)   Collection Time: 07/23/15  7:40 PM  Result Value Ref Range Status   Specimen Description BLOOD LEFT ANTECUBITAL  Final   Special Requests BOTTLES DRAWN AEROBIC AND ANAEROBIC 5CC  Final   Culture NO GROWTH 4 DAYS  Final   Report Status PENDING  Incomplete  MRSA PCR Screening     Status: None   Collection Time: 07/24/15  8:03 PM  Result Value Ref Range Status   MRSA by PCR NEGATIVE NEGATIVE Final    Comment:        The GeneXpert MRSA Assay (FDA approved for NASAL specimens only), is one  component of a comprehensive MRSA colonization surveillance program. It is not intended to diagnose MRSA infection nor to guide or monitor treatment for MRSA infections.      Scheduled Meds: . folic acid  1 mg Oral Daily  . furosemide  60 mg Oral BID  .  lactulose  20 g Oral BID  . multivitamin with minerals  1 tablet Oral Daily  . pantoprazole (PROTONIX) IV  40 mg Intravenous Q12H  . sodium chloride flush  3 mL Intravenous Q12H  . spironolactone  100 mg Oral Daily  . thiamine  100 mg Oral Daily   Continuous Infusions:

## 2015-07-27 NOTE — Progress Notes (Signed)
Physical Therapy Treatment Patient Details Name: Jose [name] MRN: [id number] DOB: 29-Sep-1955 Today's Date: 07/27/2015    History of Present Illness 60 y.o. male with past medical history of chronic hepatitis C, alcohol abuse, hypertension who presented to [facility] with reports of worsening left knee pain.     PT Comments    Pt improved with therapy today, but is still limited with ambulation due to increased L knee pain. Pt presents with functional limitations due to decreased strength, balance, functional activity tolerance, and acute pain. Pt is very pleasant and cooperative with therapy. Pt will continue to benefit from PT services to increase independence with functional mobility. Pt will need SNF placement for short term rehab as pt does not have any help at the group home where he lives.    Follow Up Recommendations  SNF;Supervision for mobility/OOB     Equipment Recommendations  Rolling walker with 5" wheels    Recommendations for Other Services       Precautions / Restrictions Precautions Precautions: Fall Restrictions Weight Bearing Restrictions: No    Mobility  Bed Mobility Overal bed mobility: Modified Independent             General bed mobility comments: increased time  Transfers Overall transfer level: Needs assistance Equipment used: Rolling walker (2 wheeled);Straight cane Transfers: Sit to/from [facility] Sit to Stand: Min assist Stand pivot transfers: Min assist       General transfer comment: Min A to stand due to pain. VC's for sequencing and use of RW. Needed Min A for turning walker due to tight spacing trying to get over to the bedside commode.   Ambulation/Gait Ambulation/Gait assistance: Min guard Ambulation Distance (Feet): 50 Feet Assistive device: Rolling walker (2 wheeled) Gait Pattern/deviations: Step-to pattern;Decreased step length - left;Decreased stance time - left;Decreased weight shift to  left;Antalgic;Trunk flexed;Wide base of support Gait velocity: very slow Gait velocity interpretation: Below normal speed for age/gender General Gait Details: Educated on sequencing with the RW due to increased L LE pain. Pt has very guarded and antalgic gait pattern as he does not want to put much weight on his L LE due to pain in his knee.    Stairs            Wheelchair Mobility    Modified Rankin (Stroke Patients Only)       Balance Overall balance assessment: Needs assistance Sitting-balance support: No upper extremity supported;Feet unsupported Sitting balance-Leahy Scale: Normal     Standing balance support: No upper extremity supported Standing balance-Leahy Scale: Fair Standing balance comment: Pt able to stand staticaly without UE support, but needs UE support for dynamic balance due to L knee pain.                     Cognition Arousal/Alertness: Awake/alert Behavior During Therapy: WFL for tasks assessed/performed Overall Cognitive Status: Within Functional Limits for tasks assessed                      Exercises      General Comments General comments (skin integrity, edema, etc.): Pt has very painful L knee, likely due to gout. Pt asked to go to bedside commode for activity. Pt's condom catheter fell off when he sat down on the commode. RN aware.       Pertinent Vitals/Pain Pain Assessment: Faces Faces Pain Scale: Hurts even more Pain Location: L knee with ambulation Pain Descriptors / Indicators: Aching;Grimacing;Guarding Pain Intervention(s):  Monitored during session;Repositioned    Home Living                      Prior Function            PT Goals (current goals can now be found in the care plan section) Acute Rehab PT Goals Patient Stated Goal: Be independent PT Goal Formulation: With patient Time For Goal Achievement: 08/09/15 Potential to Achieve Goals: Good Progress towards PT goals: Progressing toward  goals    Frequency  Min 3X/week    PT Plan Current plan remains appropriate    Co-evaluation             End of Session Equipment Utilized During Treatment: Gait belt Activity Tolerance: Patient tolerated treatment well Patient left: in chair;with call bell/phone within reach;with chair alarm set     Time: [id number] PT Time Calculation (min) (ACUTE ONLY): 17 min  Charges:  $Gait Training: 8-22 mins                    G Codes:      [place], SPT [place] 07/27/2015, 11:20 AM

## 2015-07-27 NOTE — NC FL2 (Signed)
[place] MEDICAID FL2 LEVEL OF CARE SCREENING TOOL     IDENTIFICATION  Patient Name: Jose [name] Birthdate: January 04, 1956 Sex: male Admission Date (Current Location): 07/23/2015  [facility] and [place] Number:  Herbalist and Address:  [facility] [place]. [facility] [address] [place], [place] [address]      Provider Number: [id number]  Attending Physician Name and Address:  Robbie Lis, MD  Relative Name and Phone Number:       Current Level of Care: Hospital Recommended Level of Care: [place] Prior Approval Number:    Date Approved/Denied:   PASRR Number:    Discharge Plan: SNF    Current Diagnoses: Patient Active Problem List   Diagnosis Date Noted  . Alcoholic cirrhosis of liver with ascites ([place]) 07/25/2015  . Acute renal failure superimposed on stage 4 chronic kidney disease ([place]) 07/25/2015  . Hypokalemia 07/23/2015  . Macrocytic anemia 07/23/2015  . Alcoholism ([place]) 07/12/2015  . ARF (acute renal failure) ([place]) 06/27/2015  . Metabolic encephalopathy [id number]  . Hepatic encephalopathy ([place]) 06/26/2015  . Trichomonas infection 06/26/2015  . 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 Adenomatous polyps 10/18/2013  . Unspecified vitamin D deficiency 07/13/2013  . Atopic dermatitis 04/28/2013  . Thrombocytopenia, unspecified ([place]) 04/28/2013  . Anasarca 04/26/2013  . UTI (urinary tract infection) 04/26/2013    Orientation RESPIRATION BLADDER Height & Weight     Self, Time, Situation, Place  Normal Continent Weight: 183 lb 6.4 oz (83.19 kg) Height:  5\' 6"  (167.6 cm)  BEHAVIORAL SYMPTOMS/MOOD NEUROLOGICAL BOWEL NUTRITION STATUS      Continent    AMBULATORY STATUS COMMUNICATION OF NEEDS Skin   Limited Assist Verbally Normal                       Personal Care Assistance Level of Assistance  Bathing, Dressing, Feeding Bathing Assistance: Limited assistance Feeding assistance: Independent Dressing Assistance: Limited  assistance     Functional Limitations Info  Sight, Hearing, Speech Sight Info: Adequate Hearing Info: Adequate Speech Info: Adequate    SPECIAL CARE FACTORS FREQUENCY  PT (By licensed PT)                    Contractures      Additional Factors Info  Code Status, Allergies Code Status Info: FULL Allergies Info: No Known Allergies           Current Medications (07/27/2015):  This is the current hospital active medication list Current Facility-Administered Medications  Medication Dose Route Frequency Provider Last Rate Last Dose  . acetaminophen (TYLENOL) tablet 650 mg  650 mg Oral Q6H PRN Vianne Bulls, MD   650 mg at 07/27/15 1027   Or  . acetaminophen (TYLENOL) suppository 650 mg  650 mg Rectal Q6H PRN Vianne Bulls, MD      . folic acid (FOLVITE) tablet 1 mg  1 mg Oral Daily Cecilio Asper Batchelder, RPH   1 mg at 07/27/15 1027  . furosemide (LASIX) tablet 60 mg  60 mg Oral BID Vianne Bulls, MD   60 mg at 07/27/15 1026  . lactulose (CHRONULAC) 10 GM/15ML solution 20 g  20 g Oral BID Robbie Lis, MD   20 g at 07/27/15 1027  . multivitamin with minerals tablet 1 tablet  1 tablet Oral Daily Vianne Bulls, MD   1 tablet at 07/27/15 1025  . ondansetron (ZOFRAN) tablet 4 mg  4 mg  Oral Q6H PRN Vianne Bulls, MD       Or  . ondansetron (ZOFRAN) injection 4 mg  4 mg Intravenous Q6H PRN Vianne Bulls, MD      . pantoprazole (PROTONIX) EC tablet 40 mg  40 mg Oral Daily Robbie Lis, MD   40 mg at 07/27/15 1026  . sodium chloride flush (NS) 0.9 % injection 3 mL  3 mL Intravenous Q12H Vianne Bulls, MD   3 mL at 07/27/15 1028  . spironolactone (ALDACTONE) tablet 100 mg  100 mg Oral Daily Vianne Bulls, MD   100 mg at 07/27/15 1025  . thiamine (VITAMIN B-1) tablet 100 mg  100 mg Oral Daily Reginia Naas, RPH   100 mg at 07/27/15 1026     Discharge Medications: Please see discharge summary for a list of discharge medications.  Relevant Imaging Results:  Relevant  Lab Results:   Additional Information SS#: [id number]  Raymondo Band, LCSW

## 2015-07-27 NOTE — Progress Notes (Addendum)
Patient had bowel movement that had small amount bright red blood noted.  There was also blood on tissue when wiping.  Skin assessed and intact.  Dr. Charlies Silvers paged. Will continue to monitor.    Change order to protonix 40 mg IV Q 12 hours. Leisa Lenz

## 2015-07-27 NOTE — Clinical Social Work Note (Signed)
Clinical Social Work Assessment  Patient Details  Name: Jose [name] MRN: [id number] Date of Birth: 10-19-1955  Date of referral:  07/25/15               Reason for consult:  Facility Placement                Permission sought to share information with:  Case Manager, Family Supports Permission granted to share information::  Yes, Verbal Permission Granted  Name::     [name]  Agency::     Relationship::  Sister  Contact Information:  [phone]  Housing/Transportation Living arrangements for the past 2 months:  [place] of Information:  Patient, Other (Comment Required) (Sibling ) Patient Interpreter Needed:  None Criminal Activity/Legal Involvement Pertinent to Current Situation/Hospitalization:  No - Comment as needed Significant Relationships:  Siblings Lives with:  Facility Resident Do you feel safe going back to the place where you live?  Yes Need for family participation in patient care:  Yes (Comment)  Care giving concerns:  No concerns were reported at this time.    Social Worker assessment / plan:  CSW received consult by RN that patient is from [facility] and needing SNF. CSW went to speak with patient regarding the possible need for short term rehab. CSW introduced self and acknowledged the patient. Patient is alert and orientedx4. Patient was calm and cooperative with CSW assessment. Patient's sister [name] was at bedside. Patient provided CSW with permission to speak while family is in room. Patient reports he is from a residential facility called [facility] Patient reports he has been living at this facility for the past three months. Prior to placement there, patient reports he had his own apartment on [address]. Patient reports a history of alcohol and substance abuse. Patient reports his last drink was about two weeks ago. Patient also reports that being the last time he smoked a cigarette. Patient informed CSW that he has used cocaine  before, and reports his last usage was two weeks ago. Patient reports the residential program he is in has been the only treatment he has received before. No prior MH history reported by patient.   CSW informed patient of PT recommendation for short term rehab. Patient and Sister is agreeable to SNF placement. CSW informed patient and sister that with prior history of substance abuse and with insurance that his options may be limited. Patient and sister expressed understanding. CSW was provided permission to fax clinical information out to the facilities in [facility]. No preferences provided at this time. CSW to complete FL2 for MD signature. CSW to initiate SNF placement process.    Employment status:  Unemployed Forensic scientist:  Medicaid In [place] PT Recommendations:  [place] / Referral to community resources:     Patient/Family's Response to care:  Patient and sister is agreeable to disposition plan.   Patient/Family's Understanding of and Emotional Response to Diagnosis, Current Treatment, and Prognosis:  Patient and Sister is understanding of current treatment and prognosis. Patient and sister are both aware of possible plan to return to group home or home if no beds are available. Patient and sister was very appreciative of CSW conversation and services. CSW will follow up with family regarding offers if available.   Emotional Assessment Appearance:  Appears stated age Attitude/Demeanor/Rapport:   (Calm and Cooperative ) Affect (typically observed):  Accepting, Appropriate, Calm Orientation:  Oriented to Self, Oriented to Place, Oriented to  Time,  Oriented to Situation Alcohol / Substance use:  Tobacco Use, Alcohol Use, Illicit Drugs (Patient reports he smokes cigarettes twice a day and drinks alcohol daily. In addition, patient reports his last drink was about two weeks ago. Patient also reports this was the last time he has smoked a cigarette.  Patient also reported cocaine use ) Psych involvement (Current and /or in the community):  No (Comment)  Discharge Needs  Concerns to be addressed:  Discharge Planning Concerns Readmission within the last 30 days:  No Current discharge risk:  Physical Impairment Barriers to Discharge:  Continued Medical Work up   [employer], LCSW 07/27/2015, 11:55 AM

## 2015-07-28 LAB — CBC
HCT: 36 % — ABNORMAL LOW (ref 39.0–52.0)
Hemoglobin: 12 g/dL — ABNORMAL LOW (ref 13.0–17.0)
MCH: 35.3 pg — AB (ref 26.0–34.0)
MCHC: 33.3 g/dL (ref 30.0–36.0)
MCV: 105.9 fL — ABNORMAL HIGH (ref 78.0–100.0)
PLATELETS: 45 10*3/uL — AB (ref 150–400)
RBC: 3.4 MIL/uL — AB (ref 4.22–5.81)
RDW: 13.9 % (ref 11.5–15.5)
WBC: 3.9 10*3/uL — ABNORMAL LOW (ref 4.0–10.5)

## 2015-07-28 LAB — CULTURE, BLOOD (ROUTINE X 2)
CULTURE: NO GROWTH
Culture: NO GROWTH

## 2015-07-28 LAB — BASIC METABOLIC PANEL
Anion gap: 6 (ref 5–15)
BUN: 16 mg/dL (ref 6–20)
CO2: 21 mmol/L — ABNORMAL LOW (ref 22–32)
CREATININE: 1.07 mg/dL (ref 0.61–1.24)
Calcium: 8 mg/dL — ABNORMAL LOW (ref 8.9–10.3)
Chloride: 112 mmol/L — ABNORMAL HIGH (ref 101–111)
GFR calc Af Amer: >60 mL/min (ref >60)
Glucose, Bld: 154 mg/dL — ABNORMAL HIGH (ref 65–99)
POTASSIUM: 4.1 mmol/L (ref 3.5–5.1)
SODIUM: 139 mmol/L (ref 135–145)

## 2015-07-28 LAB — GLUCOSE, CAPILLARY: GLUCOSE-CAPILLARY: 122 mg/dL — AB (ref 65–99)

## 2015-07-28 NOTE — Progress Notes (Signed)
Patient ID: Jose [name], male   DOB: [birth year], 60 y.o.   MRN: [id number] [facility] PROGRESS NOTE  Jose [name] [id number] DOB: Sep 21, 1955 DOA: 07/23/2015 PCP: Carmie Kanner, NP  Brief narrative:    60 y.o. male with past medical history of chronic hepatitis C, alcohol abuse, hypertension who presented to [facility] with reports of worsening left knee pain. He was lethargic on admission. Blood ammonia level was 264. UDS was WNL. Potassium as 2.9 and was supplemented. Alcohol level was within normal limits. He was started on CIWA protocol.  Appreciate SW assisting discharge plan to SNF.  Assessment/Plan:    Principal Problem:   Hepatic encephalopathy ([place]) - Likely related to liver cirrhosis and related hepatitis C and alcohol abuse - Ammonia level 264 the admission and improving - Mental status good this am - Continue lactulose 20 gm BID  Active Problems:   Left knee pain - No acute findings on the x ray  - Per PT eval - SNF recommended and pt agreeable to d/c to SNF    Liver cirrhosis with ascites - Continue Lasix, spironolactone - Continue PPI's    Essential hypertension - Continue Lasix 60 mg twice daily, Aldactone 100 mg daily    History of alcohol abuse  - Continue multivitamin, thiamine and folic acid - No reports of withdrawals    Acute renal failure superimposed on chronic kidney disease stage IV - Recent creatinine baseline about 4 weeks ago was 1.7 and on this admission 1.88 - Cr improved with IV fluids - BMP in am    Thrombocytopenia, unspecified (HCC) / anemia of chronic disease / leukopenia - Related to bone marrow suppression from chronic liver cirrhosis - Some bleed per rectum 3/31 but not this am - Continue protonix 40 mg IC Q 12 hours - CBC in am    Hypokalemia - Secondary to acute hepatic encephalopathy, lethargy - Supplemented and WNL - Check BMP in am  DVT Prophylaxis  - SCDs bilaterally due to thrombocytopenia  Code  Status: Full.  Family Communication:  plan of care discussed with the patient Disposition Plan: to SNF by 4/3  IV access:  Peripheral IV  Procedures and diagnostic studies:    Ct Head Wo Contrast 07/23/2015   Mild chronic ischemic white matter disease. No acute intracranial abnormality seen.   Dg Chest Portable 1 View 07/23/2015  No acute abnormalities.  Medical Consultants:  None   Other Consultants:  PT  IAnti-Infectives:   None    Leisa Lenz, MD  [facility] Pager [phone]  Time spent in minutes: 15 minutes  If 7PM-7AM, please contact night-coverage [email or web address] Password [email or web address] 07/28/2015, 11:18 AM   LOS: 5 days    HPI/Subjective: No acute overnight events. Had blood per rectum yesterday but not today.   Objective: Filed Vitals:   07/27/15 1553 07/27/15 2224 07/28/15 0052 07/28/15 0636  BP: 138/66 120/71 146/79 140/62  Pulse: 58 60 56 54  Temp:  98.1 F (36.7 C) 98.8 F (37.1 C) 97.9 F (36.6 C)  TempSrc:  Oral Oral Oral  Resp: 16 17 18 18   Height:      Weight:      SpO2: 100% 98% 100% 98%    Intake/Output Summary (Last 24 hours) at 07/28/15 1118 Last data filed at 07/28/15 1116  Gross per 24 hour  Intake   1083 ml  Output    525 ml  Net    558 ml    Exam:   General:  Alert, no distress  Cardiovascular: (+) S1, S2, Rate controlled   Respiratory: bilateral air entry no wheezing   Abdomen: non tender, non distended, (+) BS  Extremities: No edema, palpable pulses   Neuro: No focal deficits   Data Reviewed: Basic Metabolic Panel:  Recent Labs Lab 07/23/15 1920 07/24/15 0230 07/24/15 0732 07/26/15 0300 07/27/15 0505  NA 138 141  --  138 140  K 2.9* 3.1* 3.1* 3.1* 3.8  CL 103 108  --  108 110  CO2 24 24  --  23 22  GLUCOSE 138* 121*  --  138* 117*  BUN 35* 30*  --  11 14  CREATININE 1.88* 1.41*  --  1.01 1.18  CALCIUM 8.1* 8.2*  --  7.9* 8.4*  MG  --  2.4  --   --   --    Liver Function Tests:  Recent Labs Lab  07/23/15 1920 07/24/15 0230  AST 124* 114*  ALT 58 56  ALKPHOS 225* 189*  BILITOT 1.2 1.7*  PROT 6.5 6.1*  ALBUMIN 2.2* 1.9*   No results for input(s): LIPASE, AMYLASE in the last 168 hours.  Recent Labs Lab 07/23/15 1900 07/24/15 0230  AMMONIA 264* 130*   CBC:  Recent Labs Lab 07/23/15 1920 07/24/15 0230 07/26/15 0300 07/27/15 0505 07/28/15 0958  WBC 3.3* 3.3* 3.4* 3.4* 3.9*  NEUTROABS 1.7 1.8  --   --   --   HGB 12.0* 11.9* 10.7* 12.0* 12.0*  HCT 35.0* 35.3* 31.6* 34.9* 36.0*  MCV 104.2* 104.4* 104.3* 104.8* 105.9*  PLT 52* 59* 47* 48* 45*   Cardiac Enzymes: No results for input(s): CKTOTAL, CKMB, CKMBINDEX, TROPONINI in the last 168 hours. BNP: Invalid input(s): POCBNP CBG:  Recent Labs Lab 07/24/15 2141 07/25/15 0745 07/26/15 0547 07/27/15 0618 07/28/15 0627  GLUCAP 171* 123* 134* 136* 122*    Recent Results (from the past 240 hour(s))  Blood culture (routine x 2)     Status: None (Preliminary result)   Collection Time: 07/23/15  7:20 PM  Result Value Ref Range Status   Specimen Description BLOOD RIGHT ANTECUBITAL  Final   Special Requests BOTTLES DRAWN AEROBIC AND ANAEROBIC 5CC  Final   Culture NO GROWTH 4 DAYS  Final   Report Status PENDING  Incomplete  Urine culture     Status: None   Collection Time: 07/23/15  7:30 PM  Result Value Ref Range Status   Specimen Description URINE, CATHETERIZED  Final   Special Requests NONE  Final   Culture NO GROWTH 1 DAY  Final   Report Status 07/25/2015 FINAL  Final  Blood culture (routine x 2)     Status: None (Preliminary result)   Collection Time: 07/23/15  7:40 PM  Result Value Ref Range Status   Specimen Description BLOOD LEFT ANTECUBITAL  Final   Special Requests BOTTLES DRAWN AEROBIC AND ANAEROBIC 5CC  Final   Culture NO GROWTH 4 DAYS  Final   Report Status PENDING  Incomplete  MRSA PCR Screening     Status: None   Collection Time: 07/24/15  8:03 PM  Result Value Ref Range Status   MRSA by PCR  NEGATIVE NEGATIVE Final    Comment:        The GeneXpert MRSA Assay (FDA approved for NASAL specimens only), is one component of a comprehensive MRSA colonization surveillance program. It is not intended to diagnose MRSA infection nor to guide or monitor treatment for MRSA infections.      Scheduled Meds: .  folic acid  1 mg Oral Daily  . furosemide  60 mg Oral BID  . lactulose  20 g Oral BID  . multivitamin with minerals  1 tablet Oral Daily  . pantoprazole (PROTONIX) IV  40 mg Intravenous Q12H  . sodium chloride flush  3 mL Intravenous Q12H  . spironolactone  100 mg Oral Daily  . thiamine  100 mg Oral Daily   Continuous Infusions:

## 2015-07-28 NOTE — Progress Notes (Signed)
PASARR completed for patient and number placed on Fl2. Fl2 in chart for MD's signature. Active bed search in place with currently one bed offer- [place]. CSW left message for sister- [name] to return call. Awaiting stability per MD.  CSW services will continue to monitor for stability.  Lorie Phenix. Pauline Good, [place] (weekend coverage)

## 2015-07-28 NOTE — NC FL2 (Signed)
[place] MEDICAID FL2 LEVEL OF CARE SCREENING TOOL     IDENTIFICATION  Patient Name: Jose [name] Birthdate: January 01, 1956 Sex: male Admission Date (Current Location): 07/23/2015  [facility] and [place] Number:  Herbalist and Address:  [facility] [place]. [facility], [place] [address], [place], [place] [address]      Provider Number: [id number]  Attending Physician Name and Address:  Robbie Lis, MD  Relative Name and Phone Number:       Current Level of Care: Hospital Recommended Level of Care: [place] Prior Approval Number:    Date Approved/Denied:   PASRR Number:  [id number]  Discharge Plan: SNF    Current Diagnoses: Patient Active Problem List   Diagnosis Date Noted  . Alcoholic cirrhosis of liver with ascites ([place]) 07/25/2015  . Acute renal failure superimposed on stage 4 chronic kidney disease ([place]) 07/25/2015  . Hypokalemia 07/23/2015  . Macrocytic anemia 07/23/2015  . Alcoholism ([place]) 07/12/2015  . ARF (acute renal failure) ([place]) 06/27/2015  . Metabolic encephalopathy [id number]  . Hepatic encephalopathy ([place]) 06/26/2015  . Trichomonas infection 06/26/2015  . 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 Adenomatous polyps 10/18/2013  . Unspecified vitamin D deficiency 07/13/2013  . Atopic dermatitis 04/28/2013  . Thrombocytopenia, unspecified ([place]) 04/28/2013  . Anasarca 04/26/2013  . UTI (urinary tract infection) 04/26/2013    Orientation RESPIRATION BLADDER Height & Weight     Self, Time, Situation, Place  Normal Continent Weight: 183 lb 6.4 oz (83.19 kg) Height:  5\' 6"  (167.6 cm)  BEHAVIORAL SYMPTOMS/MOOD NEUROLOGICAL BOWEL NUTRITION STATUS      Continent    AMBULATORY STATUS COMMUNICATION OF NEEDS Skin   Limited Assist Verbally Normal                       Personal Care Assistance Level of Assistance  Bathing, Dressing, Feeding Bathing Assistance: Limited assistance Feeding assistance: Independent Dressing  Assistance: Limited assistance     Functional Limitations Info  Sight, Hearing, Speech Sight Info: Adequate Hearing Info: Adequate Speech Info: Adequate    SPECIAL CARE FACTORS FREQUENCY  PT (By licensed PT)                    Contractures      Additional Factors Info  Code Status, Allergies Code Status Info: FULL Allergies Info: No Known Allergies           Current Medications (07/28/2015):  This is the current hospital active medication list Current Facility-Administered Medications  Medication Dose Route Frequency Provider Last Rate Last Dose  . acetaminophen (TYLENOL) tablet 650 mg  650 mg Oral Q6H PRN Vianne Bulls, MD   650 mg at 07/27/15 2128   Or  . acetaminophen (TYLENOL) suppository 650 mg  650 mg Rectal Q6H PRN Vianne Bulls, MD      . folic acid (FOLVITE) tablet 1 mg  1 mg Oral Daily Cecilio Asper Batchelder, RPH   1 mg at 07/28/15 1115  . furosemide (LASIX) tablet 60 mg  60 mg Oral BID Vianne Bulls, MD   60 mg at 07/28/15 0831  . lactulose (CHRONULAC) 10 GM/15ML solution 20 g  20 g Oral BID Robbie Lis, MD   20 g at 07/28/15 1115  . multivitamin with minerals tablet 1 tablet  1 tablet Oral Daily Vianne Bulls, MD   1 tablet at 07/28/15 1115  . ondansetron (ZOFRAN) tablet 4 mg  4 mg  Oral Q6H PRN Vianne Bulls, MD       Or  . ondansetron (ZOFRAN) injection 4 mg  4 mg Intravenous Q6H PRN Vianne Bulls, MD      . pantoprazole (PROTONIX) injection 40 mg  40 mg Intravenous Q12H Robbie Lis, MD   40 mg at 07/28/15 1115  . sodium chloride flush (NS) 0.9 % injection 3 mL  3 mL Intravenous Q12H Ilene Qua Opyd, MD   3 mL at 07/28/15 1116  . spironolactone (ALDACTONE) tablet 100 mg  100 mg Oral Daily Vianne Bulls, MD   100 mg at 07/28/15 1114  . thiamine (VITAMIN B-1) tablet 100 mg  100 mg Oral Daily Reginia Naas, RPH   100 mg at 07/28/15 1115     Discharge Medications: Please see discharge summary for a list of discharge medications.  Relevant  Imaging Results:  Relevant Lab Results:   Additional Information SS#: [id number]  Williemae Area, LCSW

## 2015-07-29 DIAGNOSIS — D696 Thrombocytopenia, unspecified: Secondary | ICD-10-CM

## 2015-07-29 DIAGNOSIS — I10 Essential (primary) hypertension: Secondary | ICD-10-CM

## 2015-07-29 DIAGNOSIS — K7290 Hepatic failure, unspecified without coma: Secondary | ICD-10-CM

## 2015-07-29 DIAGNOSIS — R5381 Other malaise: Secondary | ICD-10-CM

## 2015-07-29 DIAGNOSIS — D539 Nutritional anemia, unspecified: Secondary | ICD-10-CM

## 2015-07-29 DIAGNOSIS — N189 Chronic kidney disease, unspecified: Secondary | ICD-10-CM

## 2015-07-29 DIAGNOSIS — E876 Hypokalemia: Secondary | ICD-10-CM

## 2015-07-29 DIAGNOSIS — N179 Acute kidney failure, unspecified: Secondary | ICD-10-CM

## 2015-07-29 LAB — GLUCOSE, CAPILLARY: GLUCOSE-CAPILLARY: 93 mg/dL (ref 65–99)

## 2015-07-29 MED ORDER — THIAMINE HCL 100 MG PO TABS: 100.0000 mg | ORAL_TABLET | Freq: Every day | ORAL | Status: DC

## 2015-07-29 MED ORDER — FOLIC ACID 1 MG PO TABS: 1.0000 mg | ORAL_TABLET | Freq: Every day | ORAL | Status: DC

## 2015-07-29 MED ORDER — PANTOPRAZOLE SODIUM 40 MG PO TBEC: 40.0000 mg | DELAYED_RELEASE_TABLET | Freq: Every day | ORAL | Status: DC

## 2015-07-29 NOTE — Progress Notes (Signed)
Patient is discharge  to [facility] with two [facility]  personnel via ambulance..Report given to [facility] prior to discharge. IV remove prior to discharge. Left ac site is sore but right side in good condition.All personal belongings given. Vital signs taken and recorded. Patient's stable.

## 2015-07-29 NOTE — Discharge Summary (Signed)
Physician Discharge Summary  Jose [name] [id number] DOB: Feb 27, 1956 DOA: 07/23/2015  PCP: Carmie Kanner, NP  Admit date: 07/23/2015 Discharge date: 07/29/2015  Time spent: 35 minutes  Recommendations for Outpatient Follow-up:  1. Repeat BMET in 5 days to follow electrolytes and renal function  2. Repeat CBC in 5 days to follow Hgb and platelets trend 3.   Discharge Diagnoses:  Principal Problem:   Hepatic encephalopathy ([place]) Active Problems:   Thrombocytopenia, unspecified (HCC)   Hypokalemia   Macrocytic anemia   Alcoholic cirrhosis of liver with ascites (HCC)   Acute renal failure superimposed on stage 4 chronic kidney disease ([place])   Discharge Condition: stable/improved. Will discharge to SNF for rehabilitation and conditioning. Follow up with PCP in 10-14 days after discharge from facility.  Diet recommendation: low sodium diet   Filed Weights   07/25/15 1514 07/27/15 0459 07/29/15 0625  Weight: 83.507 kg (184 lb 1.6 oz) 83.19 kg (183 lb 6.4 oz) 84.4 kg (186 lb 1.1 oz)    History of present illness:  As per H&P written by Dr. Myna Hidalgo on 07/23/15 60 y.o. male with PMH of liver cirrhosis, chronic hepatitis C, alcohol abuse, hypertension, and thrombocytopenia who presents to the ED complaining of left knee pain. Unfortunately, the patient was extremely lethargic and no further history could be obtained. He was given a dose of Narcan, but without significant response. Patient had a recent admission for hepatic encephalopathy and ammonia level was checked in the ED. Ona returned elevated at 264 and lactulose was ordered. Patient was noted to be afebrile, saturating well on room air, with bradycardia in the 40s, and blood pressure stable. Labs were notable for a potassium of 2.9, serum creatinine 1.88, up from his apparent baseline is 0.8, white blood cell count of 3300, platelet count of 52,000, and MCV of 104.2. EKG reveals sinus bradycardia with QTc of 562 ms. An attempt was made  to place NG tube for administration of lactulose, but this was unsuccessful. Patient was bolused with 1 L of normal saline, blood cultures were obtained, and he will be admitted to the hospital for ongoing evaluation and management of lethargy suspected secondary to hepatic encephalopathy.  Hospital Course:  Hepatic encephalopathy ([place]) - related to liver cirrhosis and alcohol abuse (still drinking) - Ammonia level 264 on admission  - Mental status good this am - Continue lactulose 30 gm BID - abstinence/cessation counseling provided - Patient was not compliant with meds prior to admission    Left knee pain/physical deconditioning  - No acute findings on the x ray  - Per PT eval - SNF recommended and pt agreeable to d/c to SNF for rehabilitation    Liver cirrhosis with ascites - Continue Lasix, spironolactone - Continue PPI's   Essential hypertension - Continue Lasix 60 mg twice daily, Aldactone 100 mg daily - advise to follow low sodium diet -BP is well controlled and well tolerated    History of alcohol abuse  - Continue multivitamin, thiamine and folic acid - No withdrawals appreciated during this admission counseling sensation provided   Acute renal failure superimposed on chronic kidney disease stage II-III - Recent creatinine baseline about 4 weeks ago was 1.7 and on this admission 1.88 - Cr improved and WNL at discharge  - BMP in 5 days to follow renal function    Thrombocytopenia, unspecified (HCC) / anemia of chronic disease /Mild leukopenia - Related to bone marrow suppression from alcohol use; also due to chronic liver cirrhosis - Some bleeding  per rectum 3/31; associated with internal hemorrhoids; no further bleeding appreciated  - will continue po PPI - CBC in 5 days to follow Hgb trend and platelets level    Hypokalemia - Secondary to poor intake and use of diuretics intermittently  - Supplemented and WNL at discharge - Check BMP in 5 days to follow up  electrolytes   Procedures:  See below for x-ray reports   Consultations:  None   Discharge Exam: Filed Vitals:   07/29/15 0027 07/29/15 0625  BP: 128/80 120/54  Pulse: 58 63  Temp: 98.8 F (37.1 C) 98.1 F (36.7 C)  Resp: 16 17    General: Alert, Awake and Oriented X3; no acute distress. Denies CP, abd pain and SOB  Cardiovascular: (+) S1, S2, Rate controlled   Respiratory: bilateral air entry, no wheezing, no crackles   Abdomen: non tenderness, mild distension seen on exam, positive BS and benign exam  Extremities: No edema, palpable pulses   Neuro: No focal deficits   Discharge Instructions   Discharge Instructions    Diet - low sodium heart healthy    Complete by:  As directed      Discharge instructions    Complete by:  As directed   Follow low sodium diet (< 2 gram daily) Maintain adequate hydration and follow daily weights Physical therapy, rehabilitation and conditioning as per SNF protocol          Current Discharge Medication List    START taking these medications   Details  folic acid (FOLVITE) 1 MG tablet Take 1 tablet (1 mg total) by mouth daily.    pantoprazole (PROTONIX) 40 MG tablet Take 1 tablet (40 mg total) by mouth daily.    thiamine 100 MG tablet Take 1 tablet (100 mg total) by mouth daily.      CONTINUE these medications which have NOT CHANGED   Details  furosemide (LASIX) 40 MG tablet Take 1.5 tablets (60 mg total) by mouth 2 (two) times daily. Qty: 90 tablet, Refills: 3   Associated Diagnoses: Essential hypertension    lactulose (CHRONULAC) 10 GM/15ML solution Take 45 mLs (30 g total) by mouth 2 (two) times daily. Qty: 240 mL, Refills: 0    Multiple Vitamin (MULTIVITAMIN) tablet Take 1 tablet by mouth daily.    spironolactone (ALDACTONE) 100 MG tablet Take 100 mg by mouth daily. Refills: 3       No Known Allergies Follow-up Information    Schedule an appointment as soon as possible for a visit with PLACEY,MARY H,  NP.   Why:  10-14 days after discharge form SNF   Contact information:   [place] [address] [phone]       The results of significant diagnostics from this hospitalization (including imaging, microbiology, ancillary and laboratory) are listed below for reference.    Significant Diagnostic Studies: Ct Head Wo Contrast  07/23/2015  CLINICAL DATA:  Altered mental status. EXAM: CT HEAD WITHOUT CONTRAST TECHNIQUE: Contiguous axial images were obtained from the base of the skull through the vertex without intravenous contrast. COMPARISON:  None. FINDINGS: Bony calvarium appears intact. Right maxillary mucous retention cyst is noted. Mild chronic ischemic white matter disease is noted. No mass effect or midline shift is noted. Ventricular size is within normal limits. There is no evidence of mass lesion, hemorrhage or acute infarction. IMPRESSION: Mild chronic ischemic white matter disease. No acute intracranial abnormality seen. Electronically Signed   By: Marijo Conception, M.D.   On:  07/23/2015 20:36   Dg Chest Portable 1 View  07/23/2015  CLINICAL DATA:  Weakness and lethargy for 1 day, history hypertension, cirrhosis, hepatitis-C, alcohol abuse, light smoker EXAM: PORTABLE CHEST 1 VIEW COMPARISON:  Portable exam 1920 hours compared to 06/26/2015 FINDINGS: Upper normal size of cardiac silhouette. Mediastinal contours and pulmonary vascularity normal for technique. Lungs clear. No pleural effusion or pneumothorax. Bones unremarkable. IMPRESSION: No acute abnormalities. Electronically Signed   By: Lavonia Dana M.D.   On: 07/23/2015 19:36   Dg Knee Left Port  07/24/2015  CLINICAL DATA:  Left knee pain EXAM: PORTABLE LEFT KNEE - 1-2 VIEW COMPARISON:  None. FINDINGS: There is no evidence of fracture, dislocation, or joint effusion. There is no evidence of arthropathy or other focal bone abnormality. Soft tissues are unremarkable. IMPRESSION: Negative. Electronically Signed   By:  Kerby Moors M.D.   On: 07/24/2015 13:21    Microbiology: Recent Results (from the past 240 hour(s))  Blood culture (routine x 2)     Status: None   Collection Time: 07/23/15  7:20 PM  Result Value Ref Range Status   Specimen Description BLOOD RIGHT ANTECUBITAL  Final   Special Requests BOTTLES DRAWN AEROBIC AND ANAEROBIC 5CC  Final   Culture NO GROWTH 5 DAYS  Final   Report Status 07/28/2015 FINAL  Final  Urine culture     Status: None   Collection Time: 07/23/15  7:30 PM  Result Value Ref Range Status   Specimen Description URINE, CATHETERIZED  Final   Special Requests NONE  Final   Culture NO GROWTH 1 DAY  Final   Report Status 07/25/2015 FINAL  Final  Blood culture (routine x 2)     Status: None   Collection Time: 07/23/15  7:40 PM  Result Value Ref Range Status   Specimen Description BLOOD LEFT ANTECUBITAL  Final   Special Requests BOTTLES DRAWN AEROBIC AND ANAEROBIC 5CC  Final   Culture NO GROWTH 5 DAYS  Final   Report Status 07/28/2015 FINAL  Final  MRSA PCR Screening     Status: None   Collection Time: 07/24/15  8:03 PM  Result Value Ref Range Status   MRSA by PCR NEGATIVE NEGATIVE Final    Comment:        The GeneXpert MRSA Assay (FDA approved for NASAL specimens only), is one component of a comprehensive MRSA colonization surveillance program. It is not intended to diagnose MRSA infection nor to guide or monitor treatment for MRSA infections.      Labs: Basic Metabolic Panel:  Recent Labs Lab 07/23/15 1920 07/24/15 0230 07/24/15 0732 07/26/15 0300 07/27/15 0505 07/28/15 0958  NA 138 141  --  138 140 139  K 2.9* 3.1* 3.1* 3.1* 3.8 4.1  CL 103 108  --  108 110 112*  CO2 24 24  --  23 22 21*  GLUCOSE 138* 121*  --  138* 117* 154*  BUN 35* 30*  --  11 14 16   CREATININE 1.88* 1.41*  --  1.01 1.18 1.07  CALCIUM 8.1* 8.2*  --  7.9* 8.4* 8.0*  MG  --  2.4  --   --   --   --    Liver Function Tests:  Recent Labs Lab 07/23/15 1920 07/24/15 0230   AST 124* 114*  ALT 58 56  ALKPHOS 225* 189*  BILITOT 1.2 1.7*  PROT 6.5 6.1*  ALBUMIN 2.2* 1.9*    Recent Labs Lab 07/23/15 1900 07/24/15 0230  AMMONIA 264* 130*  CBC:  Recent Labs Lab 07/23/15 1920 07/24/15 0230 07/26/15 0300 07/27/15 0505 07/28/15 0958  WBC 3.3* 3.3* 3.4* 3.4* 3.9*  NEUTROABS 1.7 1.8  --   --   --   HGB 12.0* 11.9* 10.7* 12.0* 12.0*  HCT 35.0* 35.3* 31.6* 34.9* 36.0*  MCV 104.2* 104.4* 104.3* 104.8* 105.9*  PLT 52* 59* 47* 48* 45*   BNP (last 3 results)  Recent Labs  07/24/15 0230  BNP 39.8   CBG:  Recent Labs Lab 07/25/15 0745 07/26/15 0547 07/27/15 0618 07/28/15 0627 07/29/15 0619  GLUCAP 123* 134* 136* 122* 93    Signed:  Barton Dubois MD.  [facility] 07/29/2015, 11:23 AM

## 2015-08-24 MED FILL — GENERLAC 10 GM/15 ML SOLN: 10 | 30 days supply | Qty: 5400 | Fill #0

## 2015-08-24 MED FILL — OMEPRAZOLE DR 20 MG CAPSULE: 20 | 30 days supply | Qty: 30 | Fill #0

## 2015-08-24 MED FILL — FUROSEMIDE 20 MG TABLET: 20 | 30 days supply | Qty: 180 | Fill #0

## 2015-08-24 MED FILL — FOLIC ACID 1 MG TABLET: 1 | 30 days supply | Qty: 30 | Fill #0

## 2015-08-24 MED FILL — SPIRONOLACTONE 100 MG TAB: 100 | 30 days supply | Qty: 30 | Fill #0

## 2015-09-03 ENCOUNTER — Inpatient Hospital Stay (HOSPITAL_COMMUNITY)
Admission: EM | Admit: 2015-09-03 | Discharge: 2015-09-07 | DRG: 683 | Disposition: A | Discharge status: Skilled Nursing Facility | Payer: Medicaid Other | Attending: Internal Medicine | Admitting: Internal Medicine

## 2015-09-03 ENCOUNTER — Emergency Department (HOSPITAL_COMMUNITY): Payer: Medicaid Other

## 2015-09-03 ENCOUNTER — Encounter (HOSPITAL_COMMUNITY): Payer: Self-pay | Admitting: *Deleted

## 2015-09-03 DIAGNOSIS — D6959 Other secondary thrombocytopenia: Secondary | ICD-10-CM | POA: Diagnosis present

## 2015-09-03 DIAGNOSIS — C220 Liver cell carcinoma: Secondary | ICD-10-CM | POA: Diagnosis present

## 2015-09-03 DIAGNOSIS — B1920 Unspecified viral hepatitis C without hepatic coma: Secondary | ICD-10-CM | POA: Diagnosis present

## 2015-09-03 DIAGNOSIS — K7031 Alcoholic cirrhosis of liver with ascites: Secondary | ICD-10-CM | POA: Diagnosis present

## 2015-09-03 DIAGNOSIS — K7682 Hepatic encephalopathy: Secondary | ICD-10-CM | POA: Diagnosis present

## 2015-09-03 DIAGNOSIS — R7989 Other specified abnormal findings of blood chemistry: Secondary | ICD-10-CM

## 2015-09-03 DIAGNOSIS — G8929 Other chronic pain: Secondary | ICD-10-CM | POA: Diagnosis present

## 2015-09-03 DIAGNOSIS — I10 Essential (primary) hypertension: Secondary | ICD-10-CM | POA: Diagnosis present

## 2015-09-03 DIAGNOSIS — N179 Acute kidney failure, unspecified: Secondary | ICD-10-CM | POA: Diagnosis not present

## 2015-09-03 DIAGNOSIS — N1330 Unspecified hydronephrosis: Secondary | ICD-10-CM | POA: Diagnosis present

## 2015-09-03 DIAGNOSIS — D696 Thrombocytopenia, unspecified: Secondary | ICD-10-CM | POA: Diagnosis present

## 2015-09-03 DIAGNOSIS — K7290 Hepatic failure, unspecified without coma: Secondary | ICD-10-CM

## 2015-09-03 DIAGNOSIS — N390 Urinary tract infection, site not specified: Secondary | ICD-10-CM

## 2015-09-03 DIAGNOSIS — K8020 Calculus of gallbladder without cholecystitis without obstruction: Secondary | ICD-10-CM | POA: Diagnosis present

## 2015-09-03 DIAGNOSIS — M25562 Pain in left knee: Secondary | ICD-10-CM

## 2015-09-03 DIAGNOSIS — K7460 Unspecified cirrhosis of liver: Secondary | ICD-10-CM

## 2015-09-03 DIAGNOSIS — F1010 Alcohol abuse, uncomplicated: Secondary | ICD-10-CM | POA: Diagnosis present

## 2015-09-03 DIAGNOSIS — F17210 Nicotine dependence, cigarettes, uncomplicated: Secondary | ICD-10-CM | POA: Diagnosis present

## 2015-09-03 DIAGNOSIS — R945 Abnormal results of liver function studies: Secondary | ICD-10-CM

## 2015-09-03 DIAGNOSIS — R160 Hepatomegaly, not elsewhere classified: Secondary | ICD-10-CM | POA: Diagnosis present

## 2015-09-03 DIAGNOSIS — R188 Other ascites: Secondary | ICD-10-CM

## 2015-09-03 DIAGNOSIS — R531 Weakness: Secondary | ICD-10-CM | POA: Diagnosis not present

## 2015-09-03 LAB — COMPREHENSIVE METABOLIC PANEL
ALT: 79 U/L — ABNORMAL HIGH (ref 17–63)
AST: 147 U/L — AB (ref 15–41)
Albumin: 2.7 g/dL — ABNORMAL LOW (ref 3.5–5.0)
Alkaline Phosphatase: 182 U/L — ABNORMAL HIGH (ref 38–126)
Anion gap: 12 (ref 5–15)
BUN: 27 mg/dL — ABNORMAL HIGH (ref 6–20)
CHLORIDE: 103 mmol/L (ref 101–111)
CO2: 19 mmol/L — ABNORMAL LOW (ref 22–32)
Calcium: 9.2 mg/dL (ref 8.9–10.3)
Creatinine, Ser: 1.59 mg/dL — ABNORMAL HIGH (ref 0.61–1.24)
GFR, EST AFRICAN AMERICAN: 53 mL/min — AB (ref >60)
GFR, EST NON AFRICAN AMERICAN: 46 mL/min — AB (ref >60)
Glucose, Bld: 164 mg/dL — ABNORMAL HIGH (ref 65–99)
POTASSIUM: 4.2 mmol/L (ref 3.5–5.1)
Sodium: 134 mmol/L — ABNORMAL LOW (ref 135–145)
Total Bilirubin: 3.5 mg/dL — ABNORMAL HIGH (ref 0.3–1.2)
Total Protein: 8.4 g/dL — ABNORMAL HIGH (ref 6.5–8.1)

## 2015-09-03 LAB — URINE MICROSCOPIC-ADD ON

## 2015-09-03 LAB — CBC
HEMATOCRIT: 40.6 % (ref 39.0–52.0)
Hemoglobin: 14.1 g/dL (ref 13.0–17.0)
MCH: 35.2 pg — ABNORMAL HIGH (ref 26.0–34.0)
MCHC: 34.7 g/dL (ref 30.0–36.0)
MCV: 101.2 fL — AB (ref 78.0–100.0)
Platelets: 65 10*3/uL — ABNORMAL LOW (ref 150–400)
RBC: 4.01 MIL/uL — AB (ref 4.22–5.81)
RDW: 13.6 % (ref 11.5–15.5)
WBC: 4.6 10*3/uL (ref 4.0–10.5)

## 2015-09-03 LAB — URINALYSIS, ROUTINE W REFLEX MICROSCOPIC
GLUCOSE, UA: NEGATIVE mg/dL
Ketones, ur: 15 mg/dL — AB
Nitrite: POSITIVE — AB
PH: 5.5 (ref 5.0–8.0)
PROTEIN: NEGATIVE mg/dL
Specific Gravity, Urine: 1.025 (ref 1.005–1.030)

## 2015-09-03 LAB — RAPID URINE DRUG SCREEN, HOSP PERFORMED
AMPHETAMINES: NOT DETECTED
Barbiturates: NOT DETECTED
Benzodiazepines: NOT DETECTED
COCAINE: NOT DETECTED
OPIATES: NOT DETECTED
TETRAHYDROCANNABINOL: NOT DETECTED

## 2015-09-03 LAB — CREATININE, URINE, RANDOM: Creatinine, Urine: 271.64 mg/dL

## 2015-09-03 LAB — LIPASE, BLOOD: Lipase: 60 U/L — ABNORMAL HIGH (ref 11–51)

## 2015-09-03 LAB — SODIUM, URINE, RANDOM: Sodium, Ur: 43 mmol/L

## 2015-09-03 MED ORDER — SODIUM CHLORIDE 0.9 % IV SOLN
Freq: Once | INTRAVENOUS | Status: AC
  Administered 2015-09-03: via INTRAVENOUS

## 2015-09-03 MED ORDER — SODIUM CHLORIDE 0.9 % IV BOLUS (SEPSIS)
500.0000 mL | Freq: Once | INTRAVENOUS | Status: AC
  Administered 2015-09-03: 500 mL via INTRAVENOUS

## 2015-09-03 NOTE — ED Notes (Signed)
Pt reports having recent n/v/d. Reports "just not feeling well" for several days. Reports generalized weakness that has gotten progressively worse and now having difficulty ambulating this am due to weakness.

## 2015-09-03 NOTE — ED Provider Notes (Signed)
CSN: [id number]     Arrival date & time 09/03/15  1308 History   First MD Initiated Contact with Patient 09/03/15 2054     Chief Complaint  Patient presents with  . Emesis  . Weakness     (Consider location/radiation/quality/duration/timing/severity/associated sxs/prior Treatment) HPI Comments: 60 y.o. male with PMH of liver cirrhosis, chronic hepatitis C, alcohol abuse, hypertension, and thrombocytopenia who presents to the ED complaining of weakness. Pt reports that for the past few days he has been feeling unwell and weak. He has had some off and nausea and emesis. Last night, he was tired and fell asleep around 8 pm, and woke up 14 hours later. Pt was unable to get up and walk because of his weakness, and finally decided to come to the ER. He has some generalized abd tenderness with intermittent nausea. Denies fevers, diarrhea. Pt has no uti like symptoms. He denies headaches, neck pain, chest pain, dib. + cough - mild. Pt lives at a group home and takes care of himself. Currently feels too weak to go home.   ROS 10 Systems reviewed and are negative for acute change except as noted in the HPI.     Patient is a 60 y.o. male presenting with vomiting and weakness. The history is provided by the patient.  Emesis Weakness    Past Medical History  Diagnosis Date  . Hypertension   . Hepatitis C   . Alcohol abuse   . Ascites   . Cirrhosis ([place])   . Shortness of breath     with exertion  . Depression   . Alcoholism ([place]) 07/12/2015   Past Surgical History  Procedure Laterality Date  . Colonoscopy  march 2015    Dr. Renee Harder: nodular mucosa at appendiceal orifice, tubular adenoma, extremely poor prep  . Circumcision     Family History  Problem Relation Age of Onset  . Breast cancer Mother   . Hypertension Mother   . Diabetes Father   . Hypertension Sister   . Heart disease Sister   . Hypertension Paternal Grandmother   . Colon cancer Neg Hx    Social History   Substance Use Topics  . Smoking status: Light Tobacco Smoker -- 0.50 packs/day    Types: Cigarettes  . Smokeless tobacco: Never Used     Comment: STATES HE SMOKES 2-3 TIMES MONTHLY  . Alcohol Use: Yes     Comment: A couple beers every few days     Review of Systems  Gastrointestinal: Positive for vomiting.  Neurological: Positive for weakness.      Allergies  Review of patient's allergies indicates no known allergies.  Home Medications   Prior to Admission medications   Medication Sig Start Date End Date Taking? Authorizing Provider  folic acid (FOLVITE) 1 MG tablet Take 1 tablet (1 mg total) by mouth daily. 07/29/15   Barton Dubois, MD  furosemide (LASIX) 40 MG tablet Take 1.5 tablets (60 mg total) by mouth 2 (two) times daily. 10/13/13   Lance Bosch, NP  lactulose (CHRONULAC) 10 GM/15ML solution Take 45 mLs (30 g total) by mouth 2 (two) times daily. Patient taking differently: Take 30 g by mouth daily.  06/29/15   Delfina Redwood, MD  Multiple Vitamin (MULTIVITAMIN) tablet Take 1 tablet by mouth daily.    Historical Provider, MD  pantoprazole (PROTONIX) 40 MG tablet Take 1 tablet (40 mg total) by mouth daily. 07/29/15   Barton Dubois, MD  spironolactone (ALDACTONE) 100 MG tablet Take 100  mg by mouth daily. 05/30/15   Historical Provider, MD  thiamine 100 MG tablet Take 1 tablet (100 mg total) by mouth daily. 07/29/15   Barton Dubois, MD   BP 143/87 mmHg  Pulse 73  Temp(Src) 98.2 F (36.8 C) (Oral)  Resp 15  SpO2 100% Physical Exam  Constitutional: He is oriented to person, place, and time. He appears well-developed.  HENT:  Head: Atraumatic.  Eyes: Scleral icterus is present.  Neck: Neck supple.  Cardiovascular: Normal rate.   Pulmonary/Chest: Effort normal.  Abdominal: Soft. There is tenderness. There is no rebound and no guarding.  Neurological: He is alert and oriented to person, place, and time.  Skin: Skin is warm and dry.  Nursing note and vitals  reviewed.   ED Course  Procedures (including critical care time) Labs Review Labs Reviewed  LIPASE, BLOOD - Abnormal; Notable for the following:    Lipase 60 (*)    All other components within normal limits  COMPREHENSIVE METABOLIC PANEL - Abnormal; Notable for the following:    Sodium 134 (*)    CO2 19 (*)    Glucose, Bld 164 (*)    BUN 27 (*)    Creatinine, Ser 1.59 (*)    Total Protein 8.4 (*)    Albumin 2.7 (*)    AST 147 (*)    ALT 79 (*)    Alkaline Phosphatase 182 (*)    Total Bilirubin 3.5 (*)    GFR calc non Af Amer 46 (*)    GFR calc Af Amer 53 (*)    All other components within normal limits  CBC - Abnormal; Notable for the following:    RBC 4.01 (*)    MCV 101.2 (*)    MCH 35.2 (*)    Platelets 65 (*)    All other components within normal limits  URINALYSIS, ROUTINE W REFLEX MICROSCOPIC (NOT AT [facility]) - Abnormal; Notable for the following:    Color, Urine ORANGE (*)    APPearance CLOUDY (*)    Hgb urine dipstick MODERATE (*)    Bilirubin Urine SMALL (*)    Ketones, ur 15 (*)    Nitrite POSITIVE (*)    Leukocytes, UA SMALL (*)    All other components within normal limits  PROTIME-INR - Abnormal; Notable for the following:    Prothrombin Time 16.2 (*)    All other components within normal limits  LACTIC ACID, PLASMA - Abnormal; Notable for the following:    Lactic Acid, Venous 2.7 (*)    All other components within normal limits  URINE MICROSCOPIC-ADD ON - Abnormal; Notable for the following:    Squamous Epithelial / LPF 6-30 (*)    Bacteria, UA MANY (*)    Casts HYALINE CASTS (*)    All other components within normal limits  URINE CULTURE  SODIUM, URINE, RANDOM  CREATININE, URINE, RANDOM  URINE RAPID DRUG SCREEN, HOSP PERFORMED  TROPONIN I  AMMONIA    Imaging Review Dg Chest Port 1 View  09/03/2015  CLINICAL DATA:  Generalized weakness and cough for 3 days EXAM: PORTABLE CHEST 1 VIEW COMPARISON:  07/23/2015 FINDINGS: A single AP portable view of  the chest demonstrates no focal airspace consolidation or alveolar edema. The lungs are grossly clear. There is no large effusion or pneumothorax. Cardiac and mediastinal contours appear unremarkable. IMPRESSION: No active disease. Electronically Signed   By: Andreas Newport M.D.   On: 09/03/2015 22:05   I have personally reviewed and evaluated these images  and lab results as part of my medical decision-making.   EKG Interpretation None      MDM   Final diagnoses:  Generalized weakness  Hyperbilirubinemia  AKI (acute kidney injury) ([place])  Cirrhosis of liver with ascites, unspecified hepatic cirrhosis type ([place])  UTI (lower urinary tract infection)    Pt comes in with cc of generalized weakness. He is noted to be dry and labs show AKI.  Pt weak and unable to walk well for me. His labs have abnormalities on the LFTs as well, but likely to be insignificant. His Bili is elevated - showing some stress on his body for sure, could be just dehydration. Abd is tender in the periumbilical and lower abd. Infection screen + for UTI. We will start ceftriaxone and admit the patient.     Varney Biles, MD 09/04/15 [phone]

## 2015-09-03 NOTE — H&P (Signed)
History and Physical    Jose [name] [id number] DOB: 1955-07-04 DOA: 09/03/2015  Referring MD/NP/PA: Dr.Nanavati. PCP: Carmie Kanner, NP  Outpatient Specialists: Dr.Gorsuch. Hematologist. Patient coming from: Group home.  Chief Complaint: Weakness.  HPI: Jose [name] is a 60 y.o. male with medical history significant of cirrhosis of liver, alcohol abuse and has been not drinking alcohol for last 2 months, hepatitis C, chronic thrombocytopenia presents to the ER because of weakness. Patient states over the last 2-3 days patient has been feeling weak and lethargic. He has been sleeping more than usual. He has forgotten taken his medications last 2 days because of his weakness. Denies any fall or loss of consciousness denies any chest pain or shortness of breath. On exam patient appears lethargic and weak but nonfocal. Creatinine has increased from baseline and is around 1.5 now. LFTs are also elevated from baseline. Patient states he has not been drinking alcohol for last 2 months. Still has some complaints on his left knee which has been chronic at this time. Patient states he has had one episode of nausea and vomiting 4 days ago but denies any diarrhea or abdominal pain at this time.  ED Course: Patient was given fluid bolus of 500 mL and started on IV infusion of normal saline 75 mL per hour.  Review of Systems: As per HPI otherwise 10 point review of systems negative.    Past Medical History  Diagnosis Date  . Hypertension   . Hepatitis C   . Alcohol abuse   . Ascites   . Cirrhosis ([place])   . Shortness of breath     with exertion  . Depression   . Alcoholism ([place]) 07/12/2015    Past Surgical History  Procedure Laterality Date  . Colonoscopy  march 2015    Dr. Renee Harder: nodular mucosa at appendiceal orifice, tubular adenoma, extremely poor prep  . Circumcision       reports that he has been smoking Cigarettes.  He has been smoking about 0.50 packs per day. He has never  used smokeless tobacco. He reports that he drinks alcohol. He reports that he uses illicit drugs (Marijuana and Cocaine).  No Known Allergies  Family History  Problem Relation Age of Onset  . Breast cancer Mother   . Hypertension Mother   . Diabetes Father   . Hypertension Sister   . Heart disease Sister   . Hypertension Paternal Grandmother   . Colon cancer Neg Hx     Prior to Admission medications   Medication Sig Start Date End Date Taking? Authorizing Provider  folic acid (FOLVITE) 1 MG tablet Take 1 tablet (1 mg total) by mouth daily. 07/29/15   Barton Dubois, MD  furosemide (LASIX) 40 MG tablet Take 1.5 tablets (60 mg total) by mouth 2 (two) times daily. 10/13/13   Lance Bosch, NP  lactulose (CHRONULAC) 10 GM/15ML solution Take 45 mLs (30 g total) by mouth 2 (two) times daily. Patient taking differently: Take 30 g by mouth daily.  06/29/15   Delfina Redwood, MD  Multiple Vitamin (MULTIVITAMIN) tablet Take 1 tablet by mouth daily.    Historical Provider, MD  pantoprazole (PROTONIX) 40 MG tablet Take 1 tablet (40 mg total) by mouth daily. 07/29/15   Barton Dubois, MD  spironolactone (ALDACTONE) 100 MG tablet Take 100 mg by mouth daily. 05/30/15   Historical Provider, MD  thiamine 100 MG tablet Take 1 tablet (100 mg total) by mouth daily. 07/29/15   Barton Dubois,  MD    Physical Exam: Filed Vitals:   09/03/15 1721 09/03/15 2045 09/03/15 2110 09/03/15 2139  BP: 141/73 131/87  140/80  Pulse: 75 95  81  Temp: 98.2 F (36.8 C)   98.2 F (36.8 C)  TempSrc: Oral   Oral  Resp: 17 14  14   SpO2: 99% 100% 100% 100%      Constitutional: Not in distress. Filed Vitals:   09/03/15 1721 09/03/15 2045 09/03/15 2110 09/03/15 2139  BP: 141/73 131/87  140/80  Pulse: 75 95  81  Temp: 98.2 F (36.8 C)   98.2 F (36.8 C)  TempSrc: Oral   Oral  Resp: 17 14  14   SpO2: 99% 100% 100% 100%   Eyes: Mild icterus no pallor. ENMT: No discharge from the ears eyes nose or mouth. Neck: No mass  felt. No JVD appreciated. Respiratory: No rhonchi or crepitations. Cardiovascular: S1-S2 heard. Abdomen: Soft nontender bowel sounds present. Musculoskeletal: No edema. Has some minimal difficulty moving his left knee. Skin: No rash. Neurologic: Patient is mildly lethargic but oriented to time place and person. Moves all extremities. Psychiatric: Mildly lethargic.   Labs on Admission: I have personally reviewed following labs and imaging studies  CBC:  Recent Labs Lab 09/03/15 1408  WBC 4.6  HGB 14.1  HCT 40.6  MCV 101.2*  PLT 65*   Basic Metabolic Panel:  Recent Labs Lab 09/03/15 1408  NA 134*  K 4.2  CL 103  CO2 19*  GLUCOSE 164*  BUN 27*  CREATININE 1.59*  CALCIUM 9.2   GFR: CrCl cannot be calculated (Unknown ideal weight.). Liver Function Tests:  Recent Labs Lab 09/03/15 1408  AST 147*  ALT 79*  ALKPHOS 182*  BILITOT 3.5*  PROT 8.4*  ALBUMIN 2.7*    Recent Labs Lab 09/03/15 1408  LIPASE 60*   No results for input(s): AMMONIA in the last 168 hours. Coagulation Profile: No results for input(s): INR, PROTIME in the last 168 hours. Cardiac Enzymes: No results for input(s): CKTOTAL, CKMB, CKMBINDEX, TROPONINI in the last 168 hours. BNP (last 3 results) No results for input(s): PROBNP in the last 8760 hours. HbA1C: No results for input(s): HGBA1C in the last 72 hours. CBG: No results for input(s): GLUCAP in the last 168 hours. Lipid Profile: No results for input(s): CHOL, HDL, LDLCALC, TRIG, CHOLHDL, LDLDIRECT in the last 72 hours. Thyroid Function Tests: No results for input(s): TSH, T4TOTAL, FREET4, T3FREE, THYROIDAB in the last 72 hours. Anemia Panel: No results for input(s): VITAMINB12, FOLATE, FERRITIN, TIBC, IRON, RETICCTPCT in the last 72 hours. Urine analysis:    Component Value Date/Time   COLORURINE ORANGE* 09/03/2015 2300   APPEARANCEUR CLOUDY* 09/03/2015 2300   LABSPEC 1.025 09/03/2015 2300   PHURINE 5.5 09/03/2015 2300    GLUCOSEU NEGATIVE 09/03/2015 2300   HGBUR MODERATE* 09/03/2015 2300   BILIRUBINUR SMALL* 09/03/2015 2300   KETONESUR 15* 09/03/2015 2300   PROTEINUR NEGATIVE 09/03/2015 2300   UROBILINOGEN 4.0* 04/26/2013 0500   NITRITE POSITIVE* 09/03/2015 2300   LEUKOCYTESUR SMALL* 09/03/2015 2300   Sepsis Labs: @LABRCNTIP (procalcitonin:4,lacticidven:4) )No results found for this or any previous visit (from the past 240 hour(s)).   Radiological Exams on Admission: Dg Chest Port 1 View  09/03/2015  CLINICAL DATA:  Generalized weakness and cough for 3 days EXAM: PORTABLE CHEST 1 VIEW COMPARISON:  07/23/2015 FINDINGS: A single AP portable view of the chest demonstrates no focal airspace consolidation or alveolar edema. The lungs are grossly clear. There is no large effusion  or pneumothorax. Cardiac and mediastinal contours appear unremarkable. IMPRESSION: No active disease. Electronically Signed   By: Andreas Newport M.D.   On: 09/03/2015 22:05    EKG: Independently reviewed. Normal sinus rhythm with LVH.  Assessment/Plan Principal Problem:   AKI (acute kidney injury) ([place]) Active Problems:   Thrombocytopenia, unspecified ([place])   Hepatic encephalopathy ([facility])   Weakness    #1. Acute renal failure - probably from poor oral intake and diuretics. Patient also had one episode of nausea and vomiting. At this time we will hold off patient's diuretics (both Lasix and spironolactone) and gently hydrate. Patient did receive fluid bolus of 500 mL in the ER. Continue with infusion of 75 mL/h and closely follow intake and output and metabolic panel. May need to restart diuretics soon once creatinine improves. Check FENa. #2. Hepatic encephalopathy - patient appears lethargic. Ammonia levels are pending. At this time I have ordered lactulose 30 mL 3 times a day increase from his baseline. Patient also may require Xifaxan. Follow ammonia levels. CT head is pending. #3. Elevated LFTs more than his baseline with  minimally elevated lipase - I have ordered a sonogram of abdomen to check for any mass or obstruction and also for any ascites which clinically is not present. For now patient is empirically on ceftriaxone. Follow LFTs and lipase. Patient denies any abdominal pain at this time and abdomen appears benign. #4. Cirrhosis of the liver secondary to hepatitis C and alcohol abuse - Patient states he has not had any alcohol for last 2 months. Patient will need follow-up with infectious disease clinic for treatment for hepatitis C. Holding of diuretics secondary to #1. #5. Generalized weakness secondary to acute renal failure and hepatic encephalopathy. Check troponins follow lactic acid levels and continue hydration. Will consult physical therapy and social work patient probably may need rehabilitation.  #6. Chronic left knee pain - will check x-rays. #7. Chronic thrombocytopenia secondary to hepatitis C and cirrhosis of the liver - follow CBC. #8. Possible UTI - patient is on ceftriaxone. Follow urine cultures.   DVT prophylaxis: SCDs. Code Status: Full code.  Family Communication: No family at the bedside.  Disposition Plan: May need rehabilitation.  Consults called: Physical therapy and social work.  Admission status: Inpatient. Telemetry. Likely stay 2-3 days.   Rise Patience MD [facility] Pager [phone].  If 7PM-7AM, please contact night-coverage [email or web address] Password [email or web address]  09/03/2015, 11:56 PM

## 2015-09-03 NOTE — ED Notes (Signed)
MD at bedside. 

## 2015-09-04 ENCOUNTER — Observation Stay (HOSPITAL_COMMUNITY): Payer: Medicaid Other

## 2015-09-04 ENCOUNTER — Encounter (HOSPITAL_COMMUNITY): Payer: Self-pay | Admitting: Radiology

## 2015-09-04 DIAGNOSIS — K7031 Alcoholic cirrhosis of liver with ascites: Secondary | ICD-10-CM | POA: Diagnosis not present

## 2015-09-04 DIAGNOSIS — F17210 Nicotine dependence, cigarettes, uncomplicated: Secondary | ICD-10-CM | POA: Diagnosis present

## 2015-09-04 DIAGNOSIS — R945 Abnormal results of liver function studies: Secondary | ICD-10-CM

## 2015-09-04 DIAGNOSIS — N390 Urinary tract infection, site not specified: Secondary | ICD-10-CM | POA: Diagnosis present

## 2015-09-04 DIAGNOSIS — C220 Liver cell carcinoma: Secondary | ICD-10-CM | POA: Diagnosis present

## 2015-09-04 DIAGNOSIS — I10 Essential (primary) hypertension: Secondary | ICD-10-CM | POA: Diagnosis present

## 2015-09-04 DIAGNOSIS — R160 Hepatomegaly, not elsewhere classified: Secondary | ICD-10-CM | POA: Diagnosis present

## 2015-09-04 DIAGNOSIS — N1330 Unspecified hydronephrosis: Secondary | ICD-10-CM | POA: Diagnosis present

## 2015-09-04 DIAGNOSIS — Z01810 Encounter for preprocedural cardiovascular examination: Secondary | ICD-10-CM | POA: Diagnosis not present

## 2015-09-04 DIAGNOSIS — R188 Other ascites: Secondary | ICD-10-CM

## 2015-09-04 DIAGNOSIS — K7460 Unspecified cirrhosis of liver: Secondary | ICD-10-CM

## 2015-09-04 DIAGNOSIS — G8929 Other chronic pain: Secondary | ICD-10-CM | POA: Diagnosis present

## 2015-09-04 DIAGNOSIS — F1010 Alcohol abuse, uncomplicated: Secondary | ICD-10-CM | POA: Diagnosis present

## 2015-09-04 DIAGNOSIS — D6959 Other secondary thrombocytopenia: Secondary | ICD-10-CM | POA: Diagnosis present

## 2015-09-04 DIAGNOSIS — B1920 Unspecified viral hepatitis C without hepatic coma: Secondary | ICD-10-CM | POA: Diagnosis present

## 2015-09-04 DIAGNOSIS — K8020 Calculus of gallbladder without cholecystitis without obstruction: Secondary | ICD-10-CM | POA: Diagnosis present

## 2015-09-04 DIAGNOSIS — R531 Weakness: Secondary | ICD-10-CM | POA: Diagnosis present

## 2015-09-04 DIAGNOSIS — N179 Acute kidney failure, unspecified: Secondary | ICD-10-CM | POA: Diagnosis present

## 2015-09-04 DIAGNOSIS — K7290 Hepatic failure, unspecified without coma: Secondary | ICD-10-CM | POA: Diagnosis present

## 2015-09-04 DIAGNOSIS — R7989 Other specified abnormal findings of blood chemistry: Secondary | ICD-10-CM

## 2015-09-04 LAB — TROPONIN I
TROPONIN I: 0.05 ng/mL — AB (ref <0.031)
TROPONIN I: 0.06 ng/mL — AB (ref <0.031)
Troponin I: 0.05 ng/mL — ABNORMAL HIGH (ref <0.031)
Troponin I: 0.05 ng/mL — ABNORMAL HIGH (ref <0.031)

## 2015-09-04 LAB — BASIC METABOLIC PANEL
Anion gap: 13 (ref 5–15)
BUN: 26 mg/dL — AB (ref 6–20)
CHLORIDE: 102 mmol/L (ref 101–111)
CO2: 19 mmol/L — ABNORMAL LOW (ref 22–32)
Calcium: 8.5 mg/dL — ABNORMAL LOW (ref 8.9–10.3)
Creatinine, Ser: 1.4 mg/dL — ABNORMAL HIGH (ref 0.61–1.24)
GFR calc Af Amer: >60 mL/min (ref >60)
GFR calc non Af Amer: 54 mL/min — ABNORMAL LOW (ref >60)
GLUCOSE: 155 mg/dL — AB (ref 65–99)
POTASSIUM: 3.9 mmol/L (ref 3.5–5.1)
Sodium: 134 mmol/L — ABNORMAL LOW (ref 135–145)

## 2015-09-04 LAB — CBC
HEMATOCRIT: 38.8 % — AB (ref 39.0–52.0)
Hemoglobin: 13.5 g/dL (ref 13.0–17.0)
MCH: 35.6 pg — AB (ref 26.0–34.0)
MCHC: 34.8 g/dL (ref 30.0–36.0)
MCV: 102.4 fL — AB (ref 78.0–100.0)
Platelets: 44 10*3/uL — ABNORMAL LOW (ref 150–400)
RBC: 3.79 MIL/uL — ABNORMAL LOW (ref 4.22–5.81)
RDW: 13.7 % (ref 11.5–15.5)
WBC: 4.8 10*3/uL (ref 4.0–10.5)

## 2015-09-04 LAB — HEPATIC FUNCTION PANEL
ALT: 70 U/L — ABNORMAL HIGH (ref 17–63)
AST: 132 U/L — ABNORMAL HIGH (ref 15–41)
Albumin: 2.4 g/dL — ABNORMAL LOW (ref 3.5–5.0)
Alkaline Phosphatase: 137 U/L — ABNORMAL HIGH (ref 38–126)
BILIRUBIN DIRECT: 1.2 mg/dL — AB (ref 0.1–0.5)
BILIRUBIN TOTAL: 3.3 mg/dL — AB (ref 0.3–1.2)
Indirect Bilirubin: 2.1 mg/dL — ABNORMAL HIGH (ref 0.3–0.9)
Total Protein: 7.3 g/dL (ref 6.5–8.1)

## 2015-09-04 LAB — MAGNESIUM: Magnesium: 2.2 mg/dL (ref 1.7–2.4)

## 2015-09-04 LAB — AMMONIA: AMMONIA: 142 umol/L — AB (ref 9–35)

## 2015-09-04 LAB — LACTIC ACID, PLASMA
Lactic Acid, Venous: 1.6 mmol/L (ref 0.5–2.0)
Lactic Acid, Venous: 2.3 mmol/L (ref 0.5–2.0)
Lactic Acid, Venous: 2.7 mmol/L (ref 0.5–2.0)

## 2015-09-04 LAB — PROTIME-INR
INR: 1.29 (ref 0.00–1.49)
PROTHROMBIN TIME: 16.2 s — AB (ref 11.6–15.2)

## 2015-09-04 MED ORDER — ACETAMINOPHEN 325 MG PO TABS
650.0000 mg | ORAL_TABLET | Freq: Four times a day (QID) | ORAL | Status: DC | PRN
Start: 2015-09-04 — End: 2015-09-07

## 2015-09-04 MED ORDER — LACTULOSE 10 GM/15ML PO SOLN
30.0000 g | Freq: Three times a day (TID) | ORAL | Status: DC
Start: 2015-09-04 — End: 2015-09-07
  Administered 2015-09-04 – 2015-09-07 (×11): 30 g via ORAL
  Filled 2015-09-04 (×12): qty 45

## 2015-09-04 MED ORDER — CEFTRIAXONE SODIUM 1 G IJ SOLR
1.0000 g | Freq: Every day | INTRAMUSCULAR | Status: DC
  Administered 2015-09-04: 1 g via INTRAVENOUS
  Filled 2015-09-04 (×2): qty 10

## 2015-09-04 MED ORDER — ONDANSETRON HCL 4 MG/2ML IJ SOLN: 4.0000 mg | Freq: Four times a day (QID) | INTRAMUSCULAR | Status: DC | PRN

## 2015-09-04 MED ORDER — SODIUM CHLORIDE 0.9 % IV SOLN
INTRAVENOUS | Status: AC
  Administered 2015-09-04 (×2): via INTRAVENOUS

## 2015-09-04 MED ORDER — PANTOPRAZOLE SODIUM 40 MG PO TBEC
40.0000 mg | DELAYED_RELEASE_TABLET | Freq: Every day | ORAL | Status: DC
  Administered 2015-09-04 – 2015-09-07 (×4): 40 mg via ORAL
  Filled 2015-09-04 (×4): qty 1

## 2015-09-04 MED ORDER — FOLIC ACID 1 MG PO TABS
1.0000 mg | ORAL_TABLET | Freq: Every day | ORAL | Status: DC
  Administered 2015-09-04 – 2015-09-07 (×4): 1 mg via ORAL
  Filled 2015-09-04 (×4): qty 1

## 2015-09-04 MED ORDER — ADULT MULTIVITAMIN W/MINERALS CH: 1.0000 | ORAL_TABLET | Freq: Every day | ORAL | Status: DC

## 2015-09-04 MED ORDER — SODIUM CHLORIDE 0.9% FLUSH
3.0000 mL | Freq: Two times a day (BID) | INTRAVENOUS | Status: DC
  Administered 2015-09-04 – 2015-09-06 (×4): 3 mL via INTRAVENOUS

## 2015-09-04 MED ORDER — ACETAMINOPHEN 650 MG RE SUPP: 650.0000 mg | Freq: Four times a day (QID) | RECTAL | Status: DC | PRN

## 2015-09-04 MED ORDER — VITAMIN B-1 100 MG PO TABS
100.0000 mg | ORAL_TABLET | Freq: Every day | ORAL | Status: DC
  Administered 2015-09-04 – 2015-09-07 (×4): 100 mg via ORAL
  Filled 2015-09-04 (×5): qty 1

## 2015-09-04 MED ORDER — DEXTROSE 5 % IV SOLN
1.0000 g | Freq: Once | INTRAVENOUS | Status: AC
  Administered 2015-09-04: 1 g via INTRAVENOUS
  Filled 2015-09-04: qty 10

## 2015-09-04 MED ORDER — MAGNESIUM OXIDE 400 (241.3 MG) MG PO TABS
400.0000 mg | ORAL_TABLET | Freq: Every day | ORAL | Status: DC
  Administered 2015-09-04 – 2015-09-07 (×4): 400 mg via ORAL
  Filled 2015-09-04 (×4): qty 1

## 2015-09-04 MED ORDER — ADULT MULTIVITAMIN W/MINERALS CH
1.0000 | ORAL_TABLET | Freq: Every day | ORAL | Status: DC
  Administered 2015-09-04 – 2015-09-07 (×4): 1 via ORAL
  Filled 2015-09-04 (×4): qty 1

## 2015-09-04 MED ORDER — ONDANSETRON HCL 4 MG PO TABS: 4.0000 mg | ORAL_TABLET | Freq: Four times a day (QID) | ORAL | Status: DC | PRN

## 2015-09-04 NOTE — Evaluation (Signed)
Physical Therapy Evaluation Patient Details Name: Jose [name] MRN: [id number] DOB: 02/10/1956 Today's Date: 09/04/2015   History of Present Illness  Patient is a 60 y/o male with hx of chronic Hep C, alcohol abuse, HTN, chronic thrombocytopenia presents with weakness. Creatinine and LFTs elevated. Admitted with admitted for hepatic encephalopathy.  Clinical Impression  Patient presents with generalized weakness and balance deficits impacting mobility. Tolerated gait training with use of RW and Min A for balance/safety. Pt reports he might not be able to stay at his group home anymore. PTA, pt independent, using SPC as needed for mobility and taking bus to grocery store. Would benefit from ST SNF to maximize independence and mobility prior to return home.    Follow Up Recommendations SNF (even though he is obs)    Equipment Recommendations  Other (comment) (TBD)    Recommendations for Other Services OT consult     Precautions / Restrictions Precautions Precautions: Fall Restrictions Weight Bearing Restrictions: No      Mobility  Bed Mobility Overal bed mobility: Needs Assistance             General bed mobility comments: In bathroom upon PT arrival.   Transfers Overall transfer level: Needs assistance Equipment used: Rolling walker (2 wheeled) Transfers: Sit to/from Stand Sit to Stand: Min guard         General transfer comment: Min guard to boost from EOB with cues for hand placement. Transferred to chair post ambulation bout.  Ambulation/Gait Ambulation/Gait assistance: Min assist Ambulation Distance (Feet): 150 Feet Assistive device: Rolling walker (2 wheeled) Gait Pattern/deviations: Step-through pattern;Decreased stride length;Drifts right/left;Trunk flexed   Gait velocity interpretation: Below normal speed for age/gender General Gait Details: Cues for RW management/proximity and for balance at times esp with turns.   Stairs            Wheelchair  Mobility    Modified Rankin (Stroke Patients Only)       Balance Overall balance assessment: Needs assistance Sitting-balance support: Feet supported;No upper extremity supported Sitting balance-Leahy Scale: Good     Standing balance support: During functional activity Standing balance-Leahy Scale: Fair Standing balance comment: Able to stand at sink and wash hands without difficulty.                              Pertinent Vitals/Pain Pain Assessment: No/denies pain    Home Living Family/patient expects to be discharged to:: Group home                 Additional Comments: 1 flight of steps to get to bedroom. Reports he might not be able to stay at group home anymore.    Prior Function Level of Independence: Independent with assistive device(s)         Comments: cane for ambulation PTA; takes bus for appt and to grocery store     Hand Dominance        Extremity/Trunk Assessment   Upper Extremity Assessment: Defer to OT evaluation           Lower Extremity Assessment: Generalized weakness         Communication   Communication: No difficulties  Cognition Arousal/Alertness: Awake/alert Behavior During Therapy: WFL for tasks assessed/performed Overall Cognitive Status: No family/caregiver present to determine baseline cognitive functioning (A&Ox4.)                      General Comments  Exercises        Assessment/Plan    PT Assessment Patient needs continued PT services  PT Diagnosis Difficulty walking;Generalized weakness   PT Problem List Decreased strength;Pain;Decreased balance;Decreased mobility;Decreased safety awareness;Decreased knowledge of use of DME  PT Treatment Interventions Balance training;Gait training;Therapeutic exercise;Therapeutic activities;Patient/family education;DME instruction;Functional mobility training;Stair training   PT Goals (Current goals can be found in the Care Plan section) Acute  Rehab PT Goals Patient Stated Goal: to find some place to stay and to get stronger PT Goal Formulation: With patient Time For Goal Achievement: 09/18/15 Potential to Achieve Goals: Fair    Frequency Min 2X/week   Barriers to discharge Inaccessible home environment stairs to get to bedroom    Co-evaluation               End of Session Equipment Utilized During Treatment: Gait belt Activity Tolerance: Patient tolerated treatment well Patient left: in chair;with call bell/phone within reach;with chair alarm set Nurse Communication: Mobility status    Functional Assessment Tool Used: clinical judgment Functional Limitation: Mobility: Walking and moving around Mobility: Walking and Moving Around Current Status [phone]): At least 20 percent but less than 40 percent impaired, limited or restricted Mobility: Walking and Moving Around Goal Status [phone]): At least 1 percent but less than 20 percent impaired, limited or restricted    Time: 1431-1458 PT Time Calculation (min) (ACUTE ONLY): 27 min   Charges:   PT Evaluation $PT Eval Moderate Complexity: 1 Procedure PT Treatments $Gait Training: 8-22 mins   PT G Codes:   PT G-Codes **NOT FOR INPATIENT CLASS** Functional Assessment Tool Used: clinical judgment Functional Limitation: Mobility: Walking and moving around Mobility: Walking and Moving Around Current Status [id number]): At least 20 percent but less than 40 percent impaired, limited or restricted Mobility: Walking and Moving Around Goal Status [phone]): At least 1 percent but less than 20 percent impaired, limited or restricted    [place] 09/04/2015, 3:43 PM Wray Kearns, [place], DPT [phone]

## 2015-09-04 NOTE — Progress Notes (Signed)
10 beat run of vtach. pt is asymptomatic. Dr. Baltazar Najjar is aware. No new orders at this time.

## 2015-09-04 NOTE — ED Notes (Addendum)
Critical report received. Lactic Acid 2.7 MD aware

## 2015-09-04 NOTE — Progress Notes (Signed)
CRITICAL VALUE ALERT  Critical value received:  Lactic acid 2.3  Date of notification: 09/04/15  Time of notification:  [id number]  Critical value read back:Yes.    Nurse who received alert:  Ranelle Oyster, RN  MD notified (1st page):  Baltazar Najjar, NP  Time of first page: [phone]  MD notified (2nd page): Baltazar Najjar, NP  Time of second [id number]  Responding MD:  No new orders given or call returned.  Time MD responded:

## 2015-09-04 NOTE — NC FL2 (Signed)
[place] MEDICAID FL2 LEVEL OF CARE SCREENING TOOL     IDENTIFICATION  Patient Name: Jose [name] Birthdate: Jan 31, 1956 Sex: male Admission Date (Current Location): 09/03/2015  [facility] and [place] Number:  Herbalist and Address:  [facility], [place] [address] [place], [place] [address]      Provider Number: [id number]  Attending Physician Name and Address:  Reyne Dumas, MD  Relative Name and Phone Number:       Current Level of Care: Hospital Recommended Level of Care: [place] Prior Approval Number:    Date Approved/Denied:   PASRR Number: [id number]  Discharge Plan: SNF    Current Diagnoses: Patient Active Problem List   Diagnosis Date Noted  . Cirrhosis of liver with ascites ([place])   . Elevated LFTs   . AKI (acute kidney injury) ([place]) 09/03/2015  . Weakness 09/03/2015  . Generalized weakness   . Physical deconditioning   . Acute-on-chronic renal failure ([place])   . Alcoholic cirrhosis of liver with ascites ([place]) 07/25/2015  . Acute renal failure superimposed on stage 4 chronic kidney disease ([place]) 07/25/2015  . Hypokalemia 07/23/2015  . Macrocytic anemia 07/23/2015  . Alcoholism ([place]) 07/12/2015  . ARF (acute renal failure) ([place]) 06/27/2015  . Metabolic encephalopathy [id number]  . Hepatic encephalopathy ([place]) 06/26/2015  . Trichomonas infection 06/26/2015  . 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 Adenomatous polyps 10/18/2013  . Unspecified vitamin D deficiency 07/13/2013  . Atopic dermatitis 04/28/2013  . Thrombocytopenia, unspecified ([place]) 04/28/2013  . Anasarca 04/26/2013  . UTI (urinary tract infection) 04/26/2013    Orientation RESPIRATION BLADDER Height & Weight     Self, Time, Situation, Place  Normal Continent Weight: 78.9 kg (173 lb 15.1 oz) Height:     BEHAVIORAL SYMPTOMS/MOOD NEUROLOGICAL BOWEL NUTRITION STATUS      Continent Diet (Please See DC summary)  AMBULATORY STATUS COMMUNICATION OF  NEEDS Skin   Supervision Verbally Normal                       Personal Care Assistance Level of Assistance  Bathing, Feeding, Dressing Bathing Assistance: Independent Feeding assistance: Independent Dressing Assistance: Limited assistance     Functional Limitations Info             SPECIAL CARE FACTORS FREQUENCY  PT (By licensed PT)     PT Frequency: min 2x/week              Contractures      Additional Factors Info  Code Status, Allergies Code Status Info: Full Allergies Info: NKA           Current Medications (09/04/2015):  This is the current hospital active medication list Current Facility-Administered Medications  Medication Dose Route Frequency Provider Last Rate Last Dose  . 0.9 %  sodium chloride infusion   Intravenous Continuous Rise Patience, MD 75 mL/hr at 09/04/15 0321    . acetaminophen (TYLENOL) tablet 650 mg  650 mg Oral Q6H PRN Rise Patience, MD       Or  . acetaminophen (TYLENOL) suppository 650 mg  650 mg Rectal Q6H PRN Rise Patience, MD      . cefTRIAXone (ROCEPHIN) 1 g in dextrose 5 % 50 mL IVPB  1 g Intravenous QHS Franky Macho, RPH      . folic acid (FOLVITE) tablet 1 mg  1 mg Oral Daily Rise Patience, MD   1 mg at 09/04/15 1102  .  lactulose (CHRONULAC) 10 GM/15ML solution 30 g  30 g Oral TID Rise Patience, MD   30 g at 09/04/15 1101  . magnesium oxide (MAG-OX) tablet 400 mg  400 mg Oral Daily Reyne Dumas, MD      . multivitamin with minerals tablet 1 tablet  1 tablet Oral Daily Rise Patience, MD   1 tablet at 09/04/15 1101  . ondansetron (ZOFRAN) tablet 4 mg  4 mg Oral Q6H PRN Rise Patience, MD       Or  . ondansetron [facility]) injection 4 mg  4 mg Intravenous Q6H PRN Rise Patience, MD      . pantoprazole (PROTONIX) EC tablet 40 mg  40 mg Oral Daily Rise Patience, MD   40 mg at 09/04/15 1101  . sodium chloride flush (NS) 0.9 % injection 3 mL  3 mL Intravenous Q12H Rise Patience, MD   3 mL at 09/04/15 0322  . thiamine (VITAMIN B-1) tablet 100 mg  100 mg Oral Daily Rise Patience, MD   100 mg at 09/04/15 0320     Discharge Medications: Please see discharge summary for a list of discharge medications.  Relevant Imaging Results:  Relevant Lab Results:   Additional Information SS#: [id number]  Benard Halsted, LCSWA

## 2015-09-04 NOTE — Progress Notes (Signed)
Pharmacy Antibiotic Note  Jose [name] is a 60 y.o. male admitted on 09/03/2015 with UTI.  Pharmacy has been consulted for Rocephin dosing.  Plan: Rocephin 1gm IV q24h Pharmacy will sign off - please reconsult if needed    Temp (24hrs), Avg:98.3 F (36.8 C), Min:98.2 F (36.8 C), Max:98.4 F (36.9 C)   Recent Labs Lab 09/03/15 1408 09/04/15  WBC 4.6  --   CREATININE 1.59*  --   LATICACIDVEN  --  2.7*    CrCl cannot be calculated (Unknown ideal weight.).    No Known Allergies  Antimicrobials this admission: 5/9 Rocephin >>   Microbiology results: Pending  Thank you for allowing pharmacy to be a part of this patient's care.  Sherlon Handing, PharmD, BCPS Clinical pharmacist, pager [phone] 09/04/2015 2:42 AM

## 2015-09-04 NOTE — Progress Notes (Signed)
[facility] PROGRESS NOTE  Jose [name] [id number] DOB: 07-26-1955 DOA: 09/03/2015   PCP: Carmie Kanner, NP     Assessment/Plan: Principal Problem:   AKI (acute kidney injury) ([place]) Active Problems:   Thrombocytopenia, unspecified (HCC)   Hepatic encephalopathy ([place])   Weakness   Cirrhosis of liver with ascites (HCC)   Elevated LFTs   60 y.o. male with medical history significant of cirrhosis of liver, alcohol abuse and has been not drinking alcohol for last 2 months, hepatitis C, chronic thrombocytopenia presents to the ER because of weakness. Patient states over the last 2-3 days patient has been feeling weak and lethargic. He has been sleeping more than usual.   Creatinine has increased from baseline and is around 1.5 now. LFTs are also elevated from baseline. Patient states he has not been drinking alcohol for last 2 months. Still has some complaints on his left knee which has been chronic at this time. Patient admitted for hepatic encephalopathy. Ammonia 142  Assessment and plan   #1. Acute renal failure - probably from poor oral intake and diuretics. Patient also had one episode of nausea and vomiting. At this time we will hold off patient's diuretics (both Lasix and spironolactone) and gently hydrate. Patient did receive fluid bolus of 500 mL in the ER. Continue with infusion of 75 mL/h and closely follow intake and output and metabolic panel. May need to restart diuretics soon once creatinine improves.    #2. Hepatic encephalopathy - patient appears lethargic. Ammonia levels 142 . Continue lactulose 30 mL 3 times a day increase from his baseline. Patient also may require Xifaxan. Follow ammonia levels. CT head negative  #3. Transaminitis, elevated lipase -ultrasound shows cirrhosis, solid-appearing right lobe mass measuring 3 cm, MRI recommended. Gallstones without acute cholecystitis, chronic mild right-sided hydronephrosis. For now patient is empirically on  ceftriaxone. Follow LFTs and lipase.   With mildly elevated bilirubin and alkaline phosphatase, concern for obstructive jaundice/cholangiocarcinoma, will order MRCP  #4. Cirrhosis of the liver secondary to hepatitis C and alcohol abuse - Patient states he has not had any alcohol for last 2 months. Patient will need follow-up with infectious disease clinic for treatment for hepatitis C. Holding of diuretics secondary to #1. No ascites,  #5. Generalized weakness secondary to acute renal failure and hepatic encephalopathy. Check troponins follow lactic acid levels and continue hydration. Will consult physical therapy and social work patient probably may need rehabilitation.   #6. Chronic left knee pain - will check x-rays.  #7. Chronic thrombocytopenia secondary to hepatitis C and cirrhosis of the liver - follow CBC.  #8. Possible UTI - patient is on ceftriaxone. Follow urine cultures.  DVT prophylaxsis SCDs  Code Status:  Full code    Family Communication: Discussed in detail with the patient, all imaging results, lab results explained to the patient   Disposition Plan: Discharge in 3-4 days      Consultants:  None  Procedures:  None  Antibiotics: Anti-infectives    Start     Dose/Rate Route Frequency Ordered Stop   09/04/15 2200  cefTRIAXone (ROCEPHIN) 1 g in dextrose 5 % 50 mL IVPB     1 g 100 mL/hr over 30 Minutes Intravenous Daily at bedtime 09/04/15 0244     09/04/15 0115  cefTRIAXone (ROCEPHIN) 1 g in dextrose 5 % 50 mL IVPB     1 g 100 mL/hr over 30 Minutes Intravenous  Once 09/04/15 0110 09/04/15 0351  HPI/Subjective: Alert and oriented , denies nausea and vomiting   Objective: Filed Vitals:   09/03/15 2139 09/04/15 0015 09/04/15 0402 09/04/15 0541  BP: 140/80 143/87  127/74  Pulse: 81 73  68  Temp: 98.2 F (36.8 C)   98.4 F (36.9 C)  TempSrc: Oral   Oral  Resp: 14 15  16   Weight:   78.9 kg (173 lb 15.1 oz)   SpO2: 100% 100%  100%     Intake/Output Summary (Last 24 hours) at 09/04/15 1156 Last data filed at 09/04/15 0544  Gross per 24 hour  Intake      0 ml  Output    590 ml  Net   -590 ml    Exam:  Examination:  General exam: Appears calm and comfortable  Respiratory system: Clear to auscultation. Respiratory effort normal. Cardiovascular system: S1 & S2 heard, RRR. No JVD, murmurs, rubs, gallops or clicks. No pedal edema. Gastrointestinal system: Abdomen is nondistended, soft and nontender. No organomegaly or masses felt. Normal bowel sounds heard. Central nervous system: Alert and oriented. No focal neurological deficits. Extremities: Symmetric 5 x 5 power. Skin: No rashes, lesions or ulcers Psychiatry: Judgement and insight appear normal. Mood & affect appropriate.     Data Reviewed: I have personally reviewed following labs and imaging studies  Micro Results No results found for this or any previous visit (from the past 240 hour(s)).  Radiology Reports Dg Knee 1-2 Views Left  09/04/2015  CLINICAL DATA:  Left knee pain for several months without known injury. EXAM: LEFT KNEE - 1-2 VIEW COMPARISON:  July 24, 2015. FINDINGS: There is no evidence of fracture, dislocation, or joint effusion. There is noted increased sclerosis involving the articular surface of the medial femoral condyle. Joint spaces appear intact. Vascular calcifications are noted. IMPRESSION: No acute fracture or dislocation is noted. Focal increased sclerosis is seen involving the articular surface of the medial femoral condyle which may represent early degenerative change. Electronically Signed   By: Marijo Conception, M.D.   On: 09/04/2015 09:09   Ct Head Wo Contrast  09/04/2015  CLINICAL DATA:  Weakness.  Nausea and vomiting. EXAM: CT HEAD WITHOUT CONTRAST TECHNIQUE: Contiguous axial images were obtained from the base of the skull through the vertex without intravenous contrast. COMPARISON:  07/23/2015 FINDINGS: There is no intracranial  hemorrhage, mass or evidence of acute infarction. There is mild white matter hypodensity consistent with chronic microvascular ischemic change. There is no significant extra-axial fluid collection. No acute intracranial findings are evident. The calvarium and skullbase are intact. Visible paranasal sinuses and orbits are unremarkable. IMPRESSION: No acute intracranial findings. There are chronic appearing white matter hypodensities which likely represent small vessel ischemic disease. Electronically Signed   By: Andreas Newport M.D.   On: 09/04/2015 05:27   US Abdomen Complete  09/04/2015  CLINICAL DATA:  Elevated liver function studies, onset of abdominal pain yesterday; history of alcoholic cirrhosis, hepatitis-C EXAM: ABDOMEN ULTRASOUND COMPLETE COMPARISON:  Abdominal ultrasound of April 26, 2013 FINDINGS: Gallbladder: The gallbladder is adequately distended. Echogenic sludge and mobile stones are present. The largest stone measures 2.2 cm in diameter. There is mild gallbladder wall thickening at 3.6 mm. There is no pericholecystic fluid or positive sonographic Murphy's sign. Common bile duct: Diameter: 5.2 mm Liver: There is heterogeneously increased hepatic echotexture. The surface contour of the liver is irregular. There is a solid-appearing hypoechoic mass in the right hepatic lobe measuring 3 x 2.4 x 2.6 cm. IVC: No abnormality visualized.  Pancreas: No abnormality visualized Spleen: Size and appearance within normal limits. There is an accessory spleen measuring 1.8 cm in greatest dimension. Right Kidney: Length: 11.7 cm. The renal cortical echotexture remains lower than that of the adjacent liver. There is mild hydronephrosis. Left Kidney: Length: 12.3 cm. Echogenicity within normal limits. No mass or hydronephrosis visualized. Abdominal aorta: Bowel gas limits evaluation of the abdominal aorta. Other findings: No ascites is observed. IMPRESSION: 1. Heterogeneously increased hepatic echotexture with  irregularity of the hepatic contour consistent with known cirrhosis. Solid-appearing right lobe mass measuring 3 cm in greatest dimension. Further evaluation with hepatic protocol MRI is recommended. 2. Gallstones without sonographic evidence of acute cholecystitis. There is mild gallbladder wall thickening which has been observed in the past. 3. Chronic mild right-sided hydronephrosis. Electronically Signed   By: David  Martinique M.D.   On: 28-Sep-2015 10:04   Dg Chest Port 1 View  09/03/2015  CLINICAL DATA:  Generalized weakness and cough for 3 days EXAM: PORTABLE CHEST 1 VIEW COMPARISON:  07/23/2015 FINDINGS: A single AP portable view of the chest demonstrates no focal airspace consolidation or alveolar edema. The lungs are grossly clear. There is no large effusion or pneumothorax. Cardiac and mediastinal contours appear unremarkable. IMPRESSION: No active disease. Electronically Signed   By: Andreas Newport M.D.   On: 09/03/2015 22:05     CBC  Recent Labs Lab 09/03/15 1408 28-Sep-2015 0340  WBC 4.6 4.8  HGB 14.1 13.5  HCT 40.6 38.8*  PLT 65* 44*  MCV 101.2* 102.4*  MCH 35.2* 35.6*  MCHC 34.7 34.8  RDW 13.6 13.7    Chemistries   Recent Labs Lab 09/03/15 1408 09-28-15 0340  NA 134* 134*  K 4.2 3.9  CL 103 102  CO2 19* 19*  GLUCOSE 164* 155*  BUN 27* 26*  CREATININE 1.59* 1.40*  CALCIUM 9.2 8.5*  AST 147* 132*  ALT 79* 70*  ALKPHOS 182* 137*  BILITOT 3.5* 3.3*   ------------------------------------------------------------------------------------------------------------------ estimated creatinine clearance is 56.1 mL/min (by C-G formula based on Cr of 1.4). ------------------------------------------------------------------------------------------------------------------ No results for input(s): HGBA1C in the last 72 hours. ------------------------------------------------------------------------------------------------------------------ No results for input(s): CHOL, HDL,  LDLCALC, TRIG, CHOLHDL, LDLDIRECT in the last 72 hours. ------------------------------------------------------------------------------------------------------------------ No results for input(s): TSH, T4TOTAL, T3FREE, THYROIDAB in the last 72 hours.  Invalid input(s): FREET3 ------------------------------------------------------------------------------------------------------------------ No results for input(s): VITAMINB12, FOLATE, FERRITIN, TIBC, IRON, RETICCTPCT in the last 72 hours.  Coagulation profile  Recent Labs Lab 09-28-15  INR 1.29    No results for input(s): DDIMER in the last 72 hours.  Cardiac Enzymes  Recent Labs Lab 09/28/15 0132 09-28-2015 0340 2015-09-28 0805  TROPONINI 0.05* 0.05* 0.06*   ------------------------------------------------------------------------------------------------------------------ Invalid input(s): POCBNP   CBG: No results for input(s): GLUCAP in the last 168 hours.     Studies: Dg Knee 1-2 Views Left  2015-09-28  CLINICAL DATA:  Left knee pain for several months without known injury. EXAM: LEFT KNEE - 1-2 VIEW COMPARISON:  July 24, 2015. FINDINGS: There is no evidence of fracture, dislocation, or joint effusion. There is noted increased sclerosis involving the articular surface of the medial femoral condyle. Joint spaces appear intact. Vascular calcifications are noted. IMPRESSION: No acute fracture or dislocation is noted. Focal increased sclerosis is seen involving the articular surface of the medial femoral condyle which may represent early degenerative change. Electronically Signed   By: Marijo Conception, M.D.   On: September 28, 2015 09:09   Ct Head Wo Contrast  09-28-15  CLINICAL DATA:  Weakness.  Nausea and vomiting. EXAM: CT HEAD WITHOUT CONTRAST TECHNIQUE: Contiguous axial images were obtained from the base of the skull through the vertex without intravenous contrast. COMPARISON:  07/23/2015 FINDINGS: There is no intracranial hemorrhage,  mass or evidence of acute infarction. There is mild white matter hypodensity consistent with chronic microvascular ischemic change. There is no significant extra-axial fluid collection. No acute intracranial findings are evident. The calvarium and skullbase are intact. Visible paranasal sinuses and orbits are unremarkable. IMPRESSION: No acute intracranial findings. There are chronic appearing white matter hypodensities which likely represent small vessel ischemic disease. Electronically Signed   By: Andreas Newport M.D.   On: 09/04/2015 05:27   US Abdomen Complete  09/04/2015  CLINICAL DATA:  Elevated liver function studies, onset of abdominal pain yesterday; history of alcoholic cirrhosis, hepatitis-C EXAM: ABDOMEN ULTRASOUND COMPLETE COMPARISON:  Abdominal ultrasound of April 26, 2013 FINDINGS: Gallbladder: The gallbladder is adequately distended. Echogenic sludge and mobile stones are present. The largest stone measures 2.2 cm in diameter. There is mild gallbladder wall thickening at 3.6 mm. There is no pericholecystic fluid or positive sonographic Murphy's sign. Common bile duct: Diameter: 5.2 mm Liver: There is heterogeneously increased hepatic echotexture. The surface contour of the liver is irregular. There is a solid-appearing hypoechoic mass in the right hepatic lobe measuring 3 x 2.4 x 2.6 cm. IVC: No abnormality visualized. Pancreas: No abnormality visualized Spleen: Size and appearance within normal limits. There is an accessory spleen measuring 1.8 cm in greatest dimension. Right Kidney: Length: 11.7 cm. The renal cortical echotexture remains lower than that of the adjacent liver. There is mild hydronephrosis. Left Kidney: Length: 12.3 cm. Echogenicity within normal limits. No mass or hydronephrosis visualized. Abdominal aorta: Bowel gas limits evaluation of the abdominal aorta. Other findings: No ascites is observed. IMPRESSION: 1. Heterogeneously increased hepatic echotexture with irregularity  of the hepatic contour consistent with known cirrhosis. Solid-appearing right lobe mass measuring 3 cm in greatest dimension. Further evaluation with hepatic protocol MRI is recommended. 2. Gallstones without sonographic evidence of acute cholecystitis. There is mild gallbladder wall thickening which has been observed in the past. 3. Chronic mild right-sided hydronephrosis. Electronically Signed   By: David  Martinique M.D.   On: 09/04/2015 10:04   Dg Chest Port 1 View  09/03/2015  CLINICAL DATA:  Generalized weakness and cough for 3 days EXAM: PORTABLE CHEST 1 VIEW COMPARISON:  07/23/2015 FINDINGS: A single AP portable view of the chest demonstrates no focal airspace consolidation or alveolar edema. The lungs are grossly clear. There is no large effusion or pneumothorax. Cardiac and mediastinal contours appear unremarkable. IMPRESSION: No active disease. Electronically Signed   By: Andreas Newport M.D.   On: 09/03/2015 22:05      No results found for: HGBA1C Lab Results  Component Value Date   LDLCALC 44 06/06/2013   CREATININE 1.40* 09/04/2015       Scheduled Meds: . cefTRIAXone (ROCEPHIN)  IV  1 g Intravenous QHS  . folic acid  1 mg Oral Daily  . lactulose  30 g Oral TID  . multivitamin with minerals  1 tablet Oral Daily  . pantoprazole  40 mg Oral Daily  . sodium chloride flush  3 mL Intravenous Q12H  . thiamine  100 mg Oral Daily   Continuous Infusions: . sodium chloride 75 mL/hr at 09/04/15 0321        Time spent: >30 MINS    [facility] Pager [phone]. If 7PM-7AM, please contact night-coverage at [email or web address], password  [email or web address] 09/04/2015, 11:56 AM

## 2015-09-05 ENCOUNTER — Encounter (HOSPITAL_COMMUNITY): Payer: Self-pay | Admitting: Physician Assistant

## 2015-09-05 ENCOUNTER — Inpatient Hospital Stay (HOSPITAL_COMMUNITY): Payer: Medicaid Other

## 2015-09-05 LAB — COMPREHENSIVE METABOLIC PANEL
ALBUMIN: 2.1 g/dL — AB (ref 3.5–5.0)
ALT: 66 U/L — ABNORMAL HIGH (ref 17–63)
AST: 116 U/L — ABNORMAL HIGH (ref 15–41)
Alkaline Phosphatase: 138 U/L — ABNORMAL HIGH (ref 38–126)
Anion gap: 10 (ref 5–15)
BILIRUBIN TOTAL: 2.1 mg/dL — AB (ref 0.3–1.2)
BUN: 16 mg/dL (ref 6–20)
CHLORIDE: 102 mmol/L (ref 101–111)
CO2: 21 mmol/L — AB (ref 22–32)
Calcium: 8.4 mg/dL — ABNORMAL LOW (ref 8.9–10.3)
Creatinine, Ser: 0.99 mg/dL (ref 0.61–1.24)
GFR calc Af Amer: >60 mL/min (ref >60)
GFR calc non Af Amer: >60 mL/min (ref >60)
GLUCOSE: 106 mg/dL — AB (ref 65–99)
POTASSIUM: 4.3 mmol/L (ref 3.5–5.1)
SODIUM: 133 mmol/L — AB (ref 135–145)
TOTAL PROTEIN: 6.8 g/dL (ref 6.5–8.1)

## 2015-09-05 LAB — CBC
HEMATOCRIT: 34.8 % — AB (ref 39.0–52.0)
Hemoglobin: 12.1 g/dL — ABNORMAL LOW (ref 13.0–17.0)
MCH: 36 pg — AB (ref 26.0–34.0)
MCHC: 34.8 g/dL (ref 30.0–36.0)
MCV: 103.6 fL — AB (ref 78.0–100.0)
Platelets: 46 10*3/uL — ABNORMAL LOW (ref 150–400)
RBC: 3.36 MIL/uL — ABNORMAL LOW (ref 4.22–5.81)
RDW: 13.5 % (ref 11.5–15.5)
WBC: 4.1 10*3/uL (ref 4.0–10.5)

## 2015-09-05 LAB — TYPE AND SCREEN
ABO/RH(D): O POS
Antibody Screen: NEGATIVE

## 2015-09-05 LAB — ABO/RH: ABO/RH(D): O POS

## 2015-09-05 LAB — AMMONIA: Ammonia: 47 umol/L — ABNORMAL HIGH (ref 9–35)

## 2015-09-05 MED ORDER — SODIUM CHLORIDE 0.9 % IV SOLN
3.0000 g | Freq: Four times a day (QID) | INTRAVENOUS | Status: DC
  Filled 2015-09-05 (×4): qty 3

## 2015-09-05 MED ORDER — CEPHALEXIN 500 MG PO CAPS: 500.0000 mg | ORAL_CAPSULE | Freq: Three times a day (TID) | ORAL | Status: DC

## 2015-09-05 MED ORDER — SODIUM CHLORIDE 0.9 % IV SOLN
Freq: Once | INTRAVENOUS | Status: AC
  Administered 2015-09-06: 11:00:00 via INTRAVENOUS

## 2015-09-05 MED ORDER — GADOXETATE DISODIUM 0.25 MMOL/ML IV SOLN
8.0000 mL | Freq: Once | INTRAVENOUS | Status: AC | PRN
  Administered 2015-09-05: 8 mL via INTRAVENOUS

## 2015-09-05 MED ORDER — LACTULOSE 10 GM/15ML PO SOLN: 30.0000 g | Freq: Three times a day (TID) | ORAL | Status: DC

## 2015-09-05 MED ORDER — FUROSEMIDE 40 MG PO TABS: 40.0000 mg | ORAL_TABLET | Freq: Two times a day (BID) | ORAL | Status: DC

## 2015-09-05 MED ORDER — SPIRONOLACTONE 100 MG PO TABS: 50.0000 mg | ORAL_TABLET | Freq: Every day | ORAL | Status: DC

## 2015-09-05 MED ORDER — PANTOPRAZOLE SODIUM 40 MG PO TBEC: 40.0000 mg | DELAYED_RELEASE_TABLET | Freq: Every day | ORAL | Status: DC

## 2015-09-05 MED ORDER — THIAMINE HCL 100 MG PO TABS: 100.0000 mg | ORAL_TABLET | Freq: Every day | ORAL | Status: DC

## 2015-09-05 MED ORDER — MAGNESIUM OXIDE 400 (241.3 MG) MG PO TABS: 400.0000 mg | ORAL_TABLET | Freq: Every day | ORAL | Status: DC

## 2015-09-05 NOTE — Progress Notes (Addendum)
Discharge cancelled due to radiology recommending the need to bx the 2.8 cm lesion , he may need to be transfused with platelets prior to his bx ,   Discussed with Dr Jacklynn Lewis , she recommended to check and alpha fetoprotein level, if elevated and MRI characteristic of hepatoma ,then choice is between general surgery resecting  the mass if its peripharally located vs chemo embolization by IR, general surgery has been consulted .   Patient may not need a bx if MRI characteristic of a hepatoma and if alpha fetoprotein is high   There is no evidence of acute cholecystitis at this time , no abx indicated at this time   Will obtain baseline echo for completion of preop evaluation

## 2015-09-05 NOTE — H&P (Signed)
Chief Complaint: Liver mass  Referring Physician(s): Dr. Reyne Dumas  Supervising Physician: Corrie Mckusick  Patient Status: In-pt  History of Present Illness: Jose [name] is a 60 y.o. male with medical history significant of cirrhosis of liver, alcohol abuse, hepatitis C, and thrombocytopenia who presented to the ER on 09/03/2015 because of weakness and lethargy over the last 2-3 days.  He had been sleeping more than usual and he had forgotten taken his medications last 2 days because of his weakness.   He reports he has not been drinking alcohol for last 2 months.   He also had elevated creatinine (1.59), likely secondary to not eating/drinking due to lethargy.  He received a fluid bolus in the ER and was admitted for hydration and was started on lactulose.  Workup included US of the liver which revealed a 3 cm mass.  MRI of the liver was also obtained.  We are asked to evaluate patient for an image guided biopsy of the mass.  His has thrombocytopenia and this biopsy needs to be done as inpatient due to need for platelet infusion.  The patients mental status has improved and he is alert and oriented today.  He denies taking any blood thinners.  Past Medical History  Diagnosis Date  . Hypertension   . Hepatitis C   . Alcohol abuse   . Ascites   . Cirrhosis ([place])   . Shortness of breath     with exertion  . Depression   . Alcoholism ([place]) 07/12/2015    Past Surgical History  Procedure Laterality Date  . Colonoscopy  march 2015    Dr. Renee Harder: nodular mucosa at appendiceal orifice, tubular adenoma, extremely poor prep  . Circumcision      Allergies: Review of patient's allergies indicates no known allergies.  Medications: Prior to Admission medications   Medication Sig Start Date End Date Taking? Authorizing Provider  folic acid (FOLVITE) 1 MG tablet Take 1 tablet (1 mg total) by mouth daily. 07/29/15  Yes Barton Dubois, MD  KLOR-CON M20 20 MEQ tablet Take  20 mEq by mouth 2 (two) times daily. 07/20/15  Yes Historical Provider, MD  lactulose (CHRONULAC) 10 GM/15ML solution Take 45 mLs (30 g total) by mouth 2 (two) times daily. Patient taking differently: Take 30 g by mouth daily.  06/29/15  Yes Delfina Redwood, MD  Multiple Vitamin (MULTIVITAMIN) tablet Take 1 tablet by mouth daily.   Yes Historical Provider, MD  omeprazole (PRILOSEC) 20 MG capsule Take 20 mg by mouth daily.   Yes Historical Provider, MD  triamterene-hydrochlorothiazide (MAXZIDE-25) 37.5-25 MG tablet Take 1 tablet by mouth daily.   Yes Historical Provider, MD  cephALEXin (KEFLEX) 500 MG capsule Take 1 capsule (500 mg total) by mouth 3 (three) times daily. 09/05/15   Reyne Dumas, MD  furosemide (LASIX) 40 MG tablet Take 1 tablet (40 mg total) by mouth 2 (two) times daily. 09/05/15   Reyne Dumas, MD  lactulose (CHRONULAC) 10 GM/15ML solution Take 45 mLs (30 g total) by mouth 3 (three) times daily. 09/05/15   Reyne Dumas, MD  magnesium oxide (MAG-OX) 400 (241.3 Mg) MG tablet Take 1 tablet (400 mg total) by mouth daily. 09/05/15   Reyne Dumas, MD  pantoprazole (PROTONIX) 40 MG tablet Take 1 tablet (40 mg total) by mouth daily. 09/05/15   Reyne Dumas, MD  spironolactone (ALDACTONE) 100 MG tablet Take 0.5 tablets (50 mg total) by mouth daily. 09/05/15   Reyne Dumas, MD  thiamine 100  MG tablet Take 1 tablet (100 mg total) by mouth daily. 09/05/15   Reyne Dumas, MD     Family History  Problem Relation Age of Onset  . Breast cancer Mother   . Hypertension Mother   . Diabetes Father   . Hypertension Sister   . Heart disease Sister   . Hypertension Paternal Grandmother   . Colon cancer Neg Hx     Social History   Social History  . Marital Status: Single    Spouse Name: N/A  . Number of Children: N/A  . Years of Education: N/A   Social History Main Topics  . Smoking status: Light Tobacco Smoker -- 0.50 packs/day    Types: Cigarettes  . Smokeless tobacco: Never Used      Comment: STATES HE SMOKES 2-3 TIMES MONTHLY  . Alcohol Use: Yes     Comment: A couple beers every few days   . Drug Use: Yes    Special: Marijuana, Cocaine     Comment: last used 11/03/13  . 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 None   Social History Narrative    Review of Systems: A 12 point ROS discussed Review of Systems  Constitutional: Positive for activity change and fatigue. Negative for fever, chills and appetite change.  HENT: Negative.   Respiratory: Negative for cough and shortness of breath.   Cardiovascular: Negative for chest pain.  Gastrointestinal: Positive for abdominal pain. Negative for nausea and vomiting.  Genitourinary: Negative.   Musculoskeletal: Negative.   Skin: Negative.   Neurological: Negative.   Hematological: Negative.   Psychiatric/Behavioral: Negative.     Vital Signs: BP 144/74 mmHg  Pulse 69  Temp(Src) 98.1 F (36.7 C) (Oral)  Resp 18  Wt 177 lb 0.5 oz (80.3 kg)  SpO2 100%  Physical Exam  Constitutional: He is oriented to person, place, and time. He appears well-developed and well-nourished.  HENT:  Head: Normocephalic and atraumatic.  Eyes: EOM are normal.  Neck: Neck supple.  Cardiovascular: Normal rate, regular rhythm and normal heart sounds.   No murmur heard. Pulmonary/Chest: Effort normal and breath sounds normal. No respiratory distress.  Abdominal: Soft. Bowel sounds are normal. He exhibits distension. There is no tenderness.  Musculoskeletal: Normal range of motion.  Neurological: He is alert and oriented to person, place, and time.  Skin: Skin is warm and dry.  Psychiatric: He has a normal mood and affect. His behavior is normal. Judgment and thought content normal.  Vitals reviewed.   Mallampati Score:  MD Evaluation Airway: WNL Heart: WNL Abdomen: WNL Chest/ Lungs: WNL ASA  Classification: 2 Mallampati/Airway Score: One  Imaging: Dg Knee 1-2 Views  Left  09/04/2015  CLINICAL DATA:  Left knee pain for several months without known injury. EXAM: LEFT KNEE - 1-2 VIEW COMPARISON:  July 24, 2015. FINDINGS: There is no evidence of fracture, dislocation, or joint effusion. There is noted increased sclerosis involving the articular surface of the medial femoral condyle. Joint spaces appear intact. Vascular calcifications are noted. IMPRESSION: No acute fracture or dislocation is noted. Focal increased sclerosis is seen involving the articular surface of the medial femoral condyle which may represent early degenerative change. Electronically Signed   By: Marijo Conception, M.D.   On: 09/04/2015 09:09   Ct Head Wo Contrast  09/04/2015  CLINICAL DATA:  Weakness.  Nausea and vomiting. EXAM: CT HEAD WITHOUT CONTRAST TECHNIQUE: Contiguous axial images were  obtained from the base of the skull through the vertex without intravenous contrast. COMPARISON:  07/23/2015 FINDINGS: There is no intracranial hemorrhage, mass or evidence of acute infarction. There is mild white matter hypodensity consistent with chronic microvascular ischemic change. There is no significant extra-axial fluid collection. No acute intracranial findings are evident. The calvarium and skullbase are intact. Visible paranasal sinuses and orbits are unremarkable. IMPRESSION: No acute intracranial findings. There are chronic appearing white matter hypodensities which likely represent small vessel ischemic disease. Electronically Signed   By: Andreas Newport M.D.   On: 09/04/2015 05:27   US Abdomen Complete  09/04/2015  CLINICAL DATA:  Elevated liver function studies, onset of abdominal pain yesterday; history of alcoholic cirrhosis, hepatitis-C EXAM: ABDOMEN ULTRASOUND COMPLETE COMPARISON:  Abdominal ultrasound of April 26, 2013 FINDINGS: Gallbladder: The gallbladder is adequately distended. Echogenic sludge and mobile stones are present. The largest stone measures 2.2 cm in diameter. There is mild  gallbladder wall thickening at 3.6 mm. There is no pericholecystic fluid or positive sonographic Murphy's sign. Common bile duct: Diameter: 5.2 mm Liver: There is heterogeneously increased hepatic echotexture. The surface contour of the liver is irregular. There is a solid-appearing hypoechoic mass in the right hepatic lobe measuring 3 x 2.4 x 2.6 cm. IVC: No abnormality visualized. Pancreas: No abnormality visualized Spleen: Size and appearance within normal limits. There is an accessory spleen measuring 1.8 cm in greatest dimension. Right Kidney: Length: 11.7 cm. The renal cortical echotexture remains lower than that of the adjacent liver. There is mild hydronephrosis. Left Kidney: Length: 12.3 cm. Echogenicity within normal limits. No mass or hydronephrosis visualized. Abdominal aorta: Bowel gas limits evaluation of the abdominal aorta. Other findings: No ascites is observed. IMPRESSION: 1. Heterogeneously increased hepatic echotexture with irregularity of the hepatic contour consistent with known cirrhosis. Solid-appearing right lobe mass measuring 3 cm in greatest dimension. Further evaluation with hepatic protocol MRI is recommended. 2. Gallstones without sonographic evidence of acute cholecystitis. There is mild gallbladder wall thickening which has been observed in the past. 3. Chronic mild right-sided hydronephrosis. Electronically Signed   By: David  Martinique M.D.   On: 09/04/2015 10:04   Mr 3d Recon At Scanner  09/05/2015  CLINICAL DATA:  Cirrhosis with hepatic mass seen on recent ultrasound. EXAM: MRI ABDOMEN WITHOUT AND WITH CONTRAST (INCLUDING MRCP) TECHNIQUE: Multiplanar multisequence MR imaging of the abdomen was performed both before and after the administration of intravenous contrast. Heavily T2-weighted images of the biliary and pancreatic ducts were obtained, and three-dimensional MRCP images were rendered by post processing. CONTRAST:  8 cc Eovist COMPARISON:  Ultrasound 09/04/2015 FINDINGS:  Examination is limited by respiratory motion. Also, I do not have early arterial phase imaging. There is a 2.8 cm segment 7 hepatic lesion which has slight increased T2 signal intensity. This is bright on the T1 weighted gradient echo sequences surrounded by a background of diffuse fatty infiltration. The contrast enhanced images wall appear the same. Contrast enhancement is difficult to assess because the lesion is bright on T1. Extremely low T2 signal intensity in the liver and pancreas suggest iron deposition. The spleen appears normal. The gallbladder demonstrates gallstones, wall thickening and pericholecystic fluid. The common bile duct is normal in caliber and has a normal course. No common bile duct stones. The adrenal glands and kidneys are unremarkable. No mesenteric or retroperitoneal mass or adenopathy. Extensive portal venous collaterals due to portal hypertension. Esophageal varices are also noted. The portal vein is patent. Small amount of perihepatic ascites. No  significant bony findings. IMPRESSION: 1. Limited examination due to timing of the contrast administration and lack of precontrast LAVA sequence. There is a T1 bright 2.8 cm lesion in segment 7 of the liver. Contrast enhancement is difficult to assess but this is considered a hepatoma. Recommend ultrasound-guided biopsy. 2. Advanced cirrhotic changes with portal venous hypertension, portal venous collaterals and esophageal varices. 3. Suspect iron deposition in liver and pancreas. 4. Gallstones, gallbladder wall thickening and pericholecystic fluid. Electronically Signed   By: Marijo Sanes M.D.   On: 09/05/2015 13:44   Dg Chest Port 1 View  09/03/2015  CLINICAL DATA:  Generalized weakness and cough for 3 days EXAM: PORTABLE CHEST 1 VIEW COMPARISON:  07/23/2015 FINDINGS: A single AP portable view of the chest demonstrates no focal airspace consolidation or alveolar edema. The lungs are grossly clear. There is no large effusion or  pneumothorax. Cardiac and mediastinal contours appear unremarkable. IMPRESSION: No active disease. Electronically Signed   By: Andreas Newport M.D.   On: 09/03/2015 22:05   Mr Abd W/wo Cm/mrcp  09/05/2015  CLINICAL DATA:  Cirrhosis with hepatic mass seen on recent ultrasound. EXAM: MRI ABDOMEN WITHOUT AND WITH CONTRAST (INCLUDING MRCP) TECHNIQUE: Multiplanar multisequence MR imaging of the abdomen was performed both before and after the administration of intravenous contrast. Heavily T2-weighted images of the biliary and pancreatic ducts were obtained, and three-dimensional MRCP images were rendered by post processing. CONTRAST:  8 cc Eovist COMPARISON:  Ultrasound 09/04/2015 FINDINGS: Examination is limited by respiratory motion. Also, I do not have early arterial phase imaging. There is a 2.8 cm segment 7 hepatic lesion which has slight increased T2 signal intensity. This is bright on the T1 weighted gradient echo sequences surrounded by a background of diffuse fatty infiltration. The contrast enhanced images wall appear the same. Contrast enhancement is difficult to assess because the lesion is bright on T1. Extremely low T2 signal intensity in the liver and pancreas suggest iron deposition. The spleen appears normal. The gallbladder demonstrates gallstones, wall thickening and pericholecystic fluid. The common bile duct is normal in caliber and has a normal course. No common bile duct stones. The adrenal glands and kidneys are unremarkable. No mesenteric or retroperitoneal mass or adenopathy. Extensive portal venous collaterals due to portal hypertension. Esophageal varices are also noted. The portal vein is patent. Small amount of perihepatic ascites. No significant bony findings. IMPRESSION: 1. Limited examination due to timing of the contrast administration and lack of precontrast LAVA sequence. There is a T1 bright 2.8 cm lesion in segment 7 of the liver. Contrast enhancement is difficult to assess but  this is considered a hepatoma. Recommend ultrasound-guided biopsy. 2. Advanced cirrhotic changes with portal venous hypertension, portal venous collaterals and esophageal varices. 3. Suspect iron deposition in liver and pancreas. 4. Gallstones, gallbladder wall thickening and pericholecystic fluid. Electronically Signed   By: Marijo Sanes M.D.   On: 09/05/2015 13:44    Labs:  CBC:  Recent Labs  07/28/15 0958 09/03/15 1408 09/04/15 0340 09/05/15 0617  WBC 3.9* 4.6 4.8 4.1  HGB 12.0* 14.1 13.5 12.1*  HCT 36.0* 40.6 38.8* 34.8*  PLT 45* 65* 44* 46*    COAGS:  Recent Labs  07/23/15 1920 09/04/15  INR 1.29 1.29    BMP:  Recent Labs  07/28/15 0958 09/03/15 1408 09/04/15 0340 09/05/15 0617  NA 139 134* 134* 133*  K 4.1 4.2 3.9 4.3  CL 112* 103 102 102  CO2 21* 19* 19* 21*  GLUCOSE 154* 164*  155* 106*  BUN 16 27* 26* 16  CALCIUM 8.0* 9.2 8.5* 8.4*  CREATININE 1.07 1.59* 1.40* 0.99  GFRNONAA >60 46* 54* >60  GFRAA >60 53* >60 >60    LIVER FUNCTION TESTS:  Recent Labs  07/24/15 0230 09/03/15 1408 09/04/15 0340 09/05/15 0617  BILITOT 1.7* 3.5* 3.3* 2.1*  AST 114* 147* 132* 116*  ALT 56 79* 70* 66*  ALKPHOS 189* 182* 137* 138*  PROT 6.1* 8.4* 7.3 6.8  ALBUMIN 1.9* 2.7* 2.4* 2.1*    TUMOR MARKERS: No results for input(s): AFPTM, CEA, CA199, CHROMGRNA in the last 8760 hours.  Assessment and Plan:  Liver mass in a patient with cirrhosis secondary to Hepatitis C and ETOH abuse  Will proceed with image guided biopsy tomorrow.  Risks and Benefits discussed with the patient including, but not limited to bleeding, infection, damage to adjacent structures or low yield requiring additional tests.  All of the patient's questions were answered, patient is agreeable to proceed. Consent signed and in chart.  Thrombocytopenia - I have ordered platelets to be given prior to procedure. I have discussed the need and benefit of this transfusion with the patient and he  is agreeable.  Thank you for this interesting consult.  I greatly enjoyed meeting ULICES SASSE and look forward to participating in their care.  A copy of this report was sent to the requesting provider on this date.  Electronically Signed: Murrell Redden PA-C 09/05/2015, 3:35 PM   I spent a total of 40 Minutes in face to face in clinical consultation, greater than 50% of which was counseling/coordinating care for liver biopsy

## 2015-09-05 NOTE — Consult Note (Signed)
Reason for Consult:Hepatoma and possible cholecystitis Referring Physician: Derrek Gu  Jose [name] is an 60 y.o. male.  HPI: Jose [name] was admitted for AKI and hepatic encephalopathy. He underwent RUQ [place] which showed a mass and gallstones with chronic GB wall thickening. He then had MRI/MRCP that showed a hepatoma and gallstones with a thickened GB wall. Surgery was asked to consult on both. He denies any current abd pain or hx/o biliary colic.  Past Medical History  Diagnosis Date  . Hypertension   . Hepatitis C   . Alcohol abuse   . Ascites   . Cirrhosis ([place])   . Shortness of breath     with exertion  . Depression   . Alcoholism ([place]) 07/12/2015    Past Surgical History  Procedure Laterality Date  . Colonoscopy  march 2015    Dr. Renee Harder: nodular mucosa at appendiceal orifice, tubular adenoma, extremely poor prep  . Circumcision      Family History  Problem Relation Age of Onset  . Breast cancer Mother   . Hypertension Mother   . Diabetes Father   . Hypertension Sister   . Heart disease Sister   . Hypertension Paternal Grandmother   . Colon cancer Neg Hx     Social History:  reports that he has been smoking Cigarettes.  He has been smoking about 0.50 packs per day. He has never used smokeless tobacco. He reports that he drinks alcohol. He reports that he uses illicit drugs (Marijuana and Cocaine).  Allergies: No Known Allergies  Medications: I have reviewed the patient's current medications.  Results for orders placed or performed during the hospital encounter of 09/03/15 (from the past 48 hour(s))  Urinalysis, Routine w reflex microscopic     Status: Abnormal   Collection Time: 09/03/15 11:00 PM  Result Value Ref Range   Color, Urine ORANGE (A) YELLOW    Comment: BIOCHEMICALS MAY BE AFFECTED BY COLOR   APPearance CLOUDY (A) CLEAR   Specific Gravity, Urine 1.025 1.005 - 1.030   pH 5.5 5.0 - 8.0   Glucose, UA NEGATIVE NEGATIVE mg/dL   Hgb urine dipstick MODERATE (A)  NEGATIVE   Bilirubin Urine SMALL (A) NEGATIVE   Ketones, ur 15 (A) NEGATIVE mg/dL   Protein, ur NEGATIVE NEGATIVE mg/dL   Nitrite POSITIVE (A) NEGATIVE   Leukocytes, UA SMALL (A) NEGATIVE  Sodium, urine, random     Status: None   Collection Time: 09/03/15 11:00 PM  Result Value Ref Range   Sodium, Ur 43 mmol/L  Creatinine, urine, random     Status: None   Collection Time: 09/03/15 11:00 PM  Result Value Ref Range   Creatinine, Urine 271.64 mg/dL  Urine rapid drug screen (hosp performed)     Status: None   Collection Time: 09/03/15 11:00 PM  Result Value Ref Range   Opiates NONE DETECTED NONE DETECTED   Cocaine NONE DETECTED NONE DETECTED   Benzodiazepines NONE DETECTED NONE DETECTED   Amphetamines NONE DETECTED NONE DETECTED   Tetrahydrocannabinol NONE DETECTED NONE DETECTED   Barbiturates NONE DETECTED NONE DETECTED    Comment:        DRUG SCREEN FOR MEDICAL PURPOSES ONLY.  IF CONFIRMATION IS NEEDED FOR ANY PURPOSE, NOTIFY LAB WITHIN 5 DAYS.        LOWEST DETECTABLE LIMITS FOR URINE DRUG SCREEN Drug Class       Cutoff (ng/mL) Amphetamine      1000 Barbiturate      200 Benzodiazepine   419 Tricyclics  300 Opiates          300 Cocaine          300 THC              50   Urine microscopic-add on     Status: Abnormal   Collection Time: 09/03/15 11:00 PM  Result Value Ref Range   Squamous Epithelial / LPF 6-30 (A) NONE SEEN   WBC, UA 0-5 0 - 5 WBC/hpf   RBC / HPF 6-30 0 - 5 RBC/hpf   Bacteria, UA MANY (A) NONE SEEN   Casts HYALINE CASTS (A) NEGATIVE  Protime-INR     Status: Abnormal   Collection Time: 09/04/15 12:00 AM  Result Value Ref Range   Prothrombin Time 16.2 (H) 11.6 - 15.2 seconds   INR 1.29 0.00 - 1.49  Lactic acid, plasma     Status: Abnormal   Collection Time: 09/04/15 12:00 AM  Result Value Ref Range   Lactic Acid, Venous 2.7 (HH) 0.5 - 2.0 mmol/L    Comment: CRITICAL RESULT CALLED TO, READ BACK BY AND VERIFIED WITH: OLLIFON,J RN 09/04/2015 0043  JORDANS   Troponin I     Status: Abnormal   Collection Time: 09/04/15  1:32 AM  Result Value Ref Range   Troponin I 0.05 (H) <0.031 ng/mL    Comment:        PERSISTENTLY INCREASED TROPONIN VALUES IN THE RANGE OF 0.04-0.49 ng/mL CAN BE SEEN IN:       -UNSTABLE ANGINA       -CONGESTIVE HEART FAILURE       -MYOCARDITIS       -CHEST TRAUMA       -ARRYHTHMIAS       -LATE PRESENTING MYOCARDIAL INFARCTION       -COPD   CLINICAL FOLLOW-UP RECOMMENDED.   Ammonia     Status: Abnormal   Collection Time: 09/04/15  1:32 AM  Result Value Ref Range   Ammonia 142 (H) 9 - 35 umol/L  Basic metabolic panel     Status: Abnormal   Collection Time: 09/04/15  3:40 AM  Result Value Ref Range   Sodium 134 (L) 135 - 145 mmol/L   Potassium 3.9 3.5 - 5.1 mmol/L   Chloride 102 101 - 111 mmol/L   CO2 19 (L) 22 - 32 mmol/L   Glucose, Bld 155 (H) 65 - 99 mg/dL   BUN 26 (H) 6 - 20 mg/dL   Creatinine, Ser 1.40 (H) 0.61 - 1.24 mg/dL   Calcium 8.5 (L) 8.9 - 10.3 mg/dL   GFR calc non Af Amer 54 (L) >60 mL/min   GFR calc Af Amer >60 >60 mL/min    Comment: (NOTE) The eGFR has been calculated using the CKD EPI equation. This calculation has not been validated in all clinical situations. eGFR's persistently <60 mL/min signify possible Chronic Kidney Disease.    Anion gap 13 5 - 15  CBC     Status: Abnormal   Collection Time: 09/04/15  3:40 AM  Result Value Ref Range   WBC 4.8 4.0 - 10.5 K/uL   RBC 3.79 (L) 4.22 - 5.81 MIL/uL   Hemoglobin 13.5 13.0 - 17.0 g/dL   HCT 38.8 (L) 39.0 - 52.0 %   MCV 102.4 (H) 78.0 - 100.0 fL   MCH 35.6 (H) 26.0 - 34.0 pg   MCHC 34.8 30.0 - 36.0 g/dL   RDW 13.7 11.5 - 15.5 %   Platelets 44 (L) 150 - 400 K/uL  Comment: CONSISTENT WITH PREVIOUS RESULT  Hepatic function panel     Status: Abnormal   Collection Time: 09/04/15  3:40 AM  Result Value Ref Range   Total Protein 7.3 6.5 - 8.1 g/dL   Albumin 2.4 (L) 3.5 - 5.0 g/dL   AST 132 (H) 15 - 41 U/L   ALT 70 (H) 17 - 63  U/L   Alkaline Phosphatase 137 (H) 38 - 126 U/L   Total Bilirubin 3.3 (H) 0.3 - 1.2 mg/dL   Bilirubin, Direct 1.2 (H) 0.1 - 0.5 mg/dL   Indirect Bilirubin 2.1 (H) 0.3 - 0.9 mg/dL  Troponin I (q 6hr x 3)     Status: Abnormal   Collection Time: 09/04/15  3:40 AM  Result Value Ref Range   Troponin I 0.05 (H) <0.031 ng/mL    Comment:        PERSISTENTLY INCREASED TROPONIN VALUES IN THE RANGE OF 0.04-0.49 ng/mL CAN BE SEEN IN:       -UNSTABLE ANGINA       -CONGESTIVE HEART FAILURE       -MYOCARDITIS       -CHEST TRAUMA       -ARRYHTHMIAS       -LATE PRESENTING MYOCARDIAL INFARCTION       -COPD   CLINICAL FOLLOW-UP RECOMMENDED.   Lactic acid, plasma     Status: Abnormal   Collection Time: 09/04/15  3:40 AM  Result Value Ref Range   Lactic Acid, Venous 2.3 (HH) 0.5 - 2.0 mmol/L    Comment: CRITICAL RESULT CALLED TO, READ BACK BY AND VERIFIED WITH: THACKER,J RN 09/04/2015 0434 JORDANS   Lactic acid, plasma     Status: None   Collection Time: 09/04/15  6:12 AM  Result Value Ref Range   Lactic Acid, Venous 1.6 0.5 - 2.0 mmol/L  Troponin I (q 6hr x 3)     Status: Abnormal   Collection Time: 09/04/15  8:05 AM  Result Value Ref Range   Troponin I 0.06 (H) <0.031 ng/mL    Comment:        PERSISTENTLY INCREASED TROPONIN VALUES IN THE RANGE OF 0.04-0.49 ng/mL CAN BE SEEN IN:       -UNSTABLE ANGINA       -CONGESTIVE HEART FAILURE       -MYOCARDITIS       -CHEST TRAUMA       -ARRYHTHMIAS       -LATE PRESENTING MYOCARDIAL INFARCTION       -COPD   CLINICAL FOLLOW-UP RECOMMENDED.   Troponin I (q 6hr x 3)     Status: Abnormal   Collection Time: 09/04/15  2:25 PM  Result Value Ref Range   Troponin I 0.05 (H) <0.031 ng/mL    Comment:        PERSISTENTLY INCREASED TROPONIN VALUES IN THE RANGE OF 0.04-0.49 ng/mL CAN BE SEEN IN:       -UNSTABLE ANGINA       -CONGESTIVE HEART FAILURE       -MYOCARDITIS       -CHEST TRAUMA       -ARRYHTHMIAS       -LATE PRESENTING MYOCARDIAL  INFARCTION       -COPD   CLINICAL FOLLOW-UP RECOMMENDED.   Magnesium     Status: None   Collection Time: 09/04/15  2:25 PM  Result Value Ref Range   Magnesium 2.2 1.7 - 2.4 mg/dL  Comprehensive metabolic panel     Status: Abnormal   Collection Time: 09/05/15  6:17 AM  Result Value Ref Range   Sodium 133 (L) 135 - 145 mmol/L   Potassium 4.3 3.5 - 5.1 mmol/L   Chloride 102 101 - 111 mmol/L   CO2 21 (L) 22 - 32 mmol/L   Glucose, Bld 106 (H) 65 - 99 mg/dL   BUN 16 6 - 20 mg/dL   Creatinine, Ser 0.99 0.61 - 1.24 mg/dL   Calcium 8.4 (L) 8.9 - 10.3 mg/dL   Total Protein 6.8 6.5 - 8.1 g/dL   Albumin 2.1 (L) 3.5 - 5.0 g/dL   AST 116 (H) 15 - 41 U/L   ALT 66 (H) 17 - 63 U/L   Alkaline Phosphatase 138 (H) 38 - 126 U/L   Total Bilirubin 2.1 (H) 0.3 - 1.2 mg/dL   GFR calc non Af Amer >60 >60 mL/min   GFR calc Af Amer >60 >60 mL/min    Comment: (NOTE) The eGFR has been calculated using the CKD EPI equation. This calculation has not been validated in all clinical situations. eGFR's persistently <60 mL/min signify possible Chronic Kidney Disease.    Anion gap 10 5 - 15  CBC     Status: Abnormal   Collection Time: 09/05/15  6:17 AM  Result Value Ref Range   WBC 4.1 4.0 - 10.5 K/uL   RBC 3.36 (L) 4.22 - 5.81 MIL/uL   Hemoglobin 12.1 (L) 13.0 - 17.0 g/dL    Comment: REPEATED TO VERIFY   HCT 34.8 (L) 39.0 - 52.0 %   MCV 103.6 (H) 78.0 - 100.0 fL   MCH 36.0 (H) 26.0 - 34.0 pg   MCHC 34.8 30.0 - 36.0 g/dL   RDW 13.5 11.5 - 15.5 %   Platelets 46 (L) 150 - 400 K/uL    Comment: SPECIMEN CHECKED FOR CLOTS REPEATED TO VERIFY   Ammonia     Status: Abnormal   Collection Time: 09/05/15  9:21 AM  Result Value Ref Range   Ammonia 47 (H) 9 - 35 umol/L    Dg Knee 1-2 Views Left  09/04/2015  CLINICAL DATA:  Left knee pain for several months without known injury. EXAM: LEFT KNEE - 1-2 VIEW COMPARISON:  July 24, 2015. FINDINGS: There is no evidence of fracture, dislocation, or joint effusion.  There is noted increased sclerosis involving the articular surface of the medial femoral condyle. Joint spaces appear intact. Vascular calcifications are noted. IMPRESSION: No acute fracture or dislocation is noted. Focal increased sclerosis is seen involving the articular surface of the medial femoral condyle which may represent early degenerative change. Electronically Signed   By: Marijo Conception, M.D.   On: 09/04/2015 09:09   Ct Head Wo Contrast  09/04/2015  CLINICAL DATA:  Weakness.  Nausea and vomiting. EXAM: CT HEAD WITHOUT CONTRAST TECHNIQUE: Contiguous axial images were obtained from the base of the skull through the vertex without intravenous contrast. COMPARISON:  07/23/2015 FINDINGS: There is no intracranial hemorrhage, mass or evidence of acute infarction. There is mild white matter hypodensity consistent with chronic microvascular ischemic change. There is no significant extra-axial fluid collection. No acute intracranial findings are evident. The calvarium and skullbase are intact. Visible paranasal sinuses and orbits are unremarkable. IMPRESSION: No acute intracranial findings. There are chronic appearing white matter hypodensities which likely represent small vessel ischemic disease. Electronically Signed   By: Andreas Newport M.D.   On: 09/04/2015 05:27   US Abdomen Complete  09/04/2015  CLINICAL DATA:  Elevated liver function studies, onset of abdominal pain yesterday;  history of alcoholic cirrhosis, hepatitis-C EXAM: ABDOMEN ULTRASOUND COMPLETE COMPARISON:  Abdominal ultrasound of April 26, 2013 FINDINGS: Gallbladder: The gallbladder is adequately distended. Echogenic sludge and mobile stones are present. The largest stone measures 2.2 cm in diameter. There is mild gallbladder wall thickening at 3.6 mm. There is no pericholecystic fluid or positive sonographic Murphy's sign. Common bile duct: Diameter: 5.2 mm Liver: There is heterogeneously increased hepatic echotexture. The surface  contour of the liver is irregular. There is a solid-appearing hypoechoic mass in the right hepatic lobe measuring 3 x 2.4 x 2.6 cm. IVC: No abnormality visualized. Pancreas: No abnormality visualized Spleen: Size and appearance within normal limits. There is an accessory spleen measuring 1.8 cm in greatest dimension. Right Kidney: Length: 11.7 cm. The renal cortical echotexture remains lower than that of the adjacent liver. There is mild hydronephrosis. Left Kidney: Length: 12.3 cm. Echogenicity within normal limits. No mass or hydronephrosis visualized. Abdominal aorta: Bowel gas limits evaluation of the abdominal aorta. Other findings: No ascites is observed. IMPRESSION: 1. Heterogeneously increased hepatic echotexture with irregularity of the hepatic contour consistent with known cirrhosis. Solid-appearing right lobe mass measuring 3 cm in greatest dimension. Further evaluation with hepatic protocol MRI is recommended. 2. Gallstones without sonographic evidence of acute cholecystitis. There is mild gallbladder wall thickening which has been observed in the past. 3. Chronic mild right-sided hydronephrosis. Electronically Signed   By: David  Martinique M.D.   On: 09/04/2015 10:04   Mr 3d Recon At Scanner  09/05/2015  CLINICAL DATA:  Cirrhosis with hepatic mass seen on recent ultrasound. EXAM: MRI ABDOMEN WITHOUT AND WITH CONTRAST (INCLUDING MRCP) TECHNIQUE: Multiplanar multisequence MR imaging of the abdomen was performed both before and after the administration of intravenous contrast. Heavily T2-weighted images of the biliary and pancreatic ducts were obtained, and three-dimensional MRCP images were rendered by post processing. CONTRAST:  8 cc Eovist COMPARISON:  Ultrasound 09/04/2015 FINDINGS: Examination is limited by respiratory motion. Also, I do not have early arterial phase imaging. There is a 2.8 cm segment 7 hepatic lesion which has slight increased T2 signal intensity. This is bright on the T1 weighted  gradient echo sequences surrounded by a background of diffuse fatty infiltration. The contrast enhanced images wall appear the same. Contrast enhancement is difficult to assess because the lesion is bright on T1. Extremely low T2 signal intensity in the liver and pancreas suggest iron deposition. The spleen appears normal. The gallbladder demonstrates gallstones, wall thickening and pericholecystic fluid. The common bile duct is normal in caliber and has a normal course. No common bile duct stones. The adrenal glands and kidneys are unremarkable. No mesenteric or retroperitoneal mass or adenopathy. Extensive portal venous collaterals due to portal hypertension. Esophageal varices are also noted. The portal vein is patent. Small amount of perihepatic ascites. No significant bony findings. IMPRESSION: 1. Limited examination due to timing of the contrast administration and lack of precontrast LAVA sequence. There is a T1 bright 2.8 cm lesion in segment 7 of the liver. Contrast enhancement is difficult to assess but this is considered a hepatoma. Recommend ultrasound-guided biopsy. 2. Advanced cirrhotic changes with portal venous hypertension, portal venous collaterals and esophageal varices. 3. Suspect iron deposition in liver and pancreas. 4. Gallstones, gallbladder wall thickening and pericholecystic fluid. Electronically Signed   By: Marijo Sanes M.D.   On: 09/05/2015 13:44   Dg Chest Port 1 View  09/03/2015  CLINICAL DATA:  Generalized weakness and cough for 3 days EXAM: PORTABLE CHEST 1 VIEW COMPARISON:  07/23/2015 FINDINGS: A single AP portable view of the chest demonstrates no focal airspace consolidation or alveolar edema. The lungs are grossly clear. There is no large effusion or pneumothorax. Cardiac and mediastinal contours appear unremarkable. IMPRESSION: No active disease. Electronically Signed   By: Andreas Newport M.D.   On: 09/03/2015 22:05   Mr Abd W/wo Cm/mrcp  09/05/2015  CLINICAL DATA:   Cirrhosis with hepatic mass seen on recent ultrasound. EXAM: MRI ABDOMEN WITHOUT AND WITH CONTRAST (INCLUDING MRCP) TECHNIQUE: Multiplanar multisequence MR imaging of the abdomen was performed both before and after the administration of intravenous contrast. Heavily T2-weighted images of the biliary and pancreatic ducts were obtained, and three-dimensional MRCP images were rendered by post processing. CONTRAST:  8 cc Eovist COMPARISON:  Ultrasound 09/04/2015 FINDINGS: Examination is limited by respiratory motion. Also, I do not have early arterial phase imaging. There is a 2.8 cm segment 7 hepatic lesion which has slight increased T2 signal intensity. This is bright on the T1 weighted gradient echo sequences surrounded by a background of diffuse fatty infiltration. The contrast enhanced images wall appear the same. Contrast enhancement is difficult to assess because the lesion is bright on T1. Extremely low T2 signal intensity in the liver and pancreas suggest iron deposition. The spleen appears normal. The gallbladder demonstrates gallstones, wall thickening and pericholecystic fluid. The common bile duct is normal in caliber and has a normal course. No common bile duct stones. The adrenal glands and kidneys are unremarkable. No mesenteric or retroperitoneal mass or adenopathy. Extensive portal venous collaterals due to portal hypertension. Esophageal varices are also noted. The portal vein is patent. Small amount of perihepatic ascites. No significant bony findings. IMPRESSION: 1. Limited examination due to timing of the contrast administration and lack of precontrast LAVA sequence. There is a T1 bright 2.8 cm lesion in segment 7 of the liver. Contrast enhancement is difficult to assess but this is considered a hepatoma. Recommend ultrasound-guided biopsy. 2. Advanced cirrhotic changes with portal venous hypertension, portal venous collaterals and esophageal varices. 3. Suspect iron deposition in liver and  pancreas. 4. Gallstones, gallbladder wall thickening and pericholecystic fluid. Electronically Signed   By: Marijo Sanes M.D.   On: 09/05/2015 13:44    Review of Systems  Constitutional: Positive for malaise/fatigue. Negative for fever, chills and weight loss.  Respiratory: Negative for cough.   Cardiovascular: Negative for chest pain.  Gastrointestinal: Negative for heartburn, nausea, vomiting, abdominal pain, diarrhea, constipation, blood in stool and melena.  Musculoskeletal: Negative for falls.  Neurological: Positive for weakness. Negative for dizziness.   Blood pressure 144/74, pulse 69, temperature 98.1 F (36.7 C), temperature source Oral, resp. rate 18, weight 80.3 kg (177 lb 0.5 oz), SpO2 100 %. Physical Exam  Constitutional: He appears well-developed and well-nourished. No distress.  HENT:  Head: Normocephalic and atraumatic.  Eyes: Conjunctivae are normal. Right eye exhibits no discharge. Left eye exhibits no discharge. No scleral icterus.  Neck: Normal range of motion. Neck supple.  Cardiovascular: Normal rate and regular rhythm.   Murmur heard.  Systolic murmur is present with a grade of 3/6  Respiratory: Breath sounds normal. No respiratory distress. He has no wheezes. He has no rales.  GI: Normal appearance and bowel sounds are normal. He exhibits ascites. There is no tenderness. There is negative Murphy's sign.  Musculoskeletal: Normal range of motion.  Lymphadenopathy:    He has no cervical adenopathy.  Neurological: He is alert.  Skin: Skin is warm and dry. He is not diaphoretic.  Psychiatric: He has a normal mood and affect. His behavior is normal.    Assessment/Plan: Hepatoma -- I spoke with our hepatologist Dr. Barry Dienes who suspects pt is probably not a transplant or surgical candidate. Pt reports sobriety for 4 months though physician notes as recently as March note current alcohol consumption. Alcohol level on that admission, though, was undetectable. In any  event would not recommend any intervention this admission and should follow-up as outpatient with Dr. Barry Dienes and oncology. His attending had mentioned palliation and I think he would benefit from a consult from that specialty. Cholelithiasis -- Without signs or symptoms of cholecystitis or even biliary colic the findings on [place] and MRI represent cirrhotic changes and do not require intervention or further work-up unless his symptomatology changes.  Thank you for allowing [place] to participate in Jose [name]'s care.    Lisette Abu, PA-C Pager: [phone] 09/05/2015, 2:51 PM

## 2015-09-05 NOTE — Clinical Social Work Note (Signed)
Clinical Social Work Assessment  Patient Details  Name: Jose [name] MRN: [id number] Date of Birth: October 15, 1955  Date of referral:  09/05/15               Reason for consult:  Facility Placement                Permission sought to share information with:  Chartered certified accountant granted to share information::  Yes, Verbal Permission Granted  Name::        Agency::  [facility]  Relationship::     Contact Information:     Housing/Transportation Living arrangements for the past 2 months:   (transitional rehab home- [facility] [id number]) Source of Information:  Patient Patient Interpreter Needed:  None Criminal Activity/Legal Involvement Pertinent to Current Situation/Hospitalization:  No - Comment as needed Significant Relationships:  Siblings Lives with:  Facility Resident Do you feel safe going back to the place where you live?  Yes Need for family participation in patient care:  No (Coment)  Care giving concerns: pt been living at [facility] transitional housing for substance abuse- does not have any medical management at facility and facility unsure if they can continue to accommodate pt with current medical issues.   Social Worker assessment / plan:  CSW spoke with pt concerning plan for time of DC.  Pt states he was at a rehab in [place] for his knee and then went to [facility] for substance abuse treatment.  Pt reports that facility has told him they cannot continue to have him stay there with his medical issues.  CSW discussed PT recommendation for SNF- pt is agreeable to this plan for medical management and rehab to increase independence.    Pt also reports that he was working with DSS on getting permanent housing but have not been in contact with them for awhile- CSW provided pt with DSS number to follow up concerning housing.  Employment status:  Unemployed Forensic scientist:  Medicaid [place] PT Recommendations:  [place] /  Referral to community resources:  [place]  Patient/Family's Response to care:  Pt is agreeable to SNF stay for medical management- is agreeable to LTC stay with SNF.  Pt states that he has family who stays at [facility] SNF and he would like to go there if possible.  Patient/Family's Understanding of and Emotional Response to Diagnosis, Current Treatment, and Prognosis:  Pt has no questions or concerns about his current medical state/diagnosis- pt expresses understanding that he needs additional management for these issues.  Emotional Assessment Appearance:  Appears stated age Attitude/Demeanor/Rapport:    Affect (typically observed):  Appropriate, Pleasant, Accepting Orientation:  Oriented to Self, Oriented to Place, Oriented to  Time, Oriented to Situation Alcohol / Substance use:  Alcohol Use, Illicit Drugs Psych involvement (Current and /or in the community):  No (Comment)  Discharge Needs  Concerns to be addressed:  Care Coordination Readmission within the last 30 days:  No Current discharge risk:  Physical Impairment Barriers to Discharge:  Continued Medical Work up   [employer], LCSW 09/05/2015, 9:19 AM

## 2015-09-05 NOTE — Progress Notes (Signed)
[name] for admission- per MD DC canceled for today.  CSW will continue to follow  Jose [name], [place] Social Worker [phone]

## 2015-09-05 NOTE — Care Management Note (Signed)
Case Management Note  Patient Details  Name: Jose [name] MRN: [id number] Date of Birth: [birth year]  Subjective/Objective:                 Patient admitted with AKI, has H/O Hep C, Cirrhosis, and possible new liver mass. Patient will DC to SNF, today as facilitated through [place]. Patient will need follow up with GI per Dr. Allyson Sabal. CM asked CSW to include this in FL2.   Action/Plan:  Will DC to SNF.  Expected Discharge Date:                  Expected Discharge Plan:  Skilled Nursing Facility  In-House Referral:  Clinical Social Work  Discharge planning Services  CM Consult  Post Acute Care Choice:  NA Choice offered to:     DME Arranged:    DME Agency:     HH Arranged:    [place] Agency:     Status of Service:  Completed, signed off  Medicare Important Message Given:    Date Medicare IM Given:    Medicare IM give by:    Date Additional Medicare IM Given:    Additional Medicare Important Message give by:     If discussed at [place] of Stay Meetings, dates discussed:    Additional Comments:  Carles Collet, RN 09/05/2015, 11:24 AM

## 2015-09-05 NOTE — Discharge Summary (Addendum)
Physician Discharge Summary  Jose [name] MRN: [id number] DOB/AGE: 60-Dec-1957 60 y.o.  PCP: Carmie Kanner, NP   Admit date: 09/03/2015 Discharge date: 09/05/2015  Discharge Diagnoses:     Principal Problem:   AKI (acute kidney injury) [facility]) Active Problems:   Thrombocytopenia, unspecified ([place])   Hepatic encephalopathy ([place])   Weakness   Cirrhosis of liver with ascites (HCC)   Elevated LFTs UTI   Follow-up recommendations Follow-up with PCP in 3-5 days , including all  additional recommended appointments as below Follow-up CBC, CMP, ammonia, magnesium in 3-5 days Patient needs MRI hepatic protocol to further evaluate a 3 cm mass in the right lobe of the liver, to be arranged for by PCP Patient previously seen at [facility] in [place] outpatient GI follow-up for cirrhosis and right liver lobe mass   Addendum Discharge cancelled due to radiology recommending the need to  bx the 2.8 cm lesion , he may need to be transfused with platelets prior to his bx , therefore needs to be done as an inpatient ,      Current Discharge Medication List    START taking these medications   Details  cephALEXin (KEFLEX) 500 MG capsule Take 1 capsule (500 mg total) by mouth 3 (three) times daily. Qty: 15 capsule, Refills: 0    magnesium oxide (MAG-OX) 400 (241.3 Mg) MG tablet Take 1 tablet (400 mg total) by mouth daily. Qty: 30 tablet, Refills: 0      CONTINUE these medications which have CHANGED   Details  furosemide (LASIX) 40 MG tablet Take 1 tablet (40 mg total) by mouth 2 (two) times daily. Qty: 90 tablet, Refills: 3   Associated Diagnoses: Essential hypertension    lactulose (CHRONULAC) 10 GM/15ML solution Take 45 mLs (30 g total) by mouth 3 (three) times daily. Qty: 240 mL, Refills: 0    pantoprazole (PROTONIX) 40 MG tablet Take 1 tablet (40 mg total) by mouth daily. Qty: 30 tablet, Refills: 1    spironolactone (ALDACTONE) 100 MG tablet Take 0.5  tablets (50 mg total) by mouth daily. Qty: 30 tablet, Refills: 3    thiamine 100 MG tablet Take 1 tablet (100 mg total) by mouth daily.      CONTINUE these medications which have NOT CHANGED   Details  folic acid (FOLVITE) 1 MG tablet Take 1 tablet (1 mg total) by mouth daily.    KLOR-CON M20 Jose MEQ tablet Take Jose mEq by mouth 2 (two) times daily. Refills: 0    Multiple Vitamin (MULTIVITAMIN) tablet Take 1 tablet by mouth daily.    omeprazole (PRILOSEC) Jose MG capsule Take Jose mg by mouth daily.      STOP taking these medications     triamterene-hydrochlorothiazide (MAXZIDE-25) 37.5-25 MG tablet          Discharge Condition: Overall prognosis guarded   Discharge Instructions Get Medicines reviewed and adjusted: Please take all your medications with you for your next visit with your Primary MD  Please request your Primary MD to go over all hospital tests and procedure/radiological results at the follow up, please ask your Primary MD to get all Hospital records sent to his/her office.  If you experience worsening of your admission symptoms, develop shortness of breath, life threatening emergency, suicidal or homicidal thoughts you must seek medical attention immediately by calling 911 or calling your MD immediately if symptoms less severe.  You must read complete instructions/literature along with all the possible adverse reactions/side effects for all the Medicines  you take and that have been prescribed to you. Take any new Medicines after you have completely understood and accpet all the possible adverse reactions/side effects.   Do not drive when taking Pain medications.   Do not take more than prescribed Pain, Sleep and Anxiety Medications  Special Instructions: If you have smoked or chewed Tobacco in the last 2 yrs please stop smoking, stop any regular Alcohol and or any Recreational drug use.  Wear Seat belts while driving.  Please note  You were cared for by a  hospitalist during your hospital stay. Once you are discharged, your primary care physician will handle any further medical issues. Please note that NO REFILLS for any discharge medications will be authorized once you are discharged, as it is imperative that you return to your primary care physician (or establish a relationship with a primary care physician if you do not have one) for your aftercare needs so that they can reassess your need for medications and monitor your lab values.     No Known Allergies    Disposition: 03-Skilled Nursing Facility   Consults:  None     Significant Diagnostic Studies:  Dg Knee 1-2 Views Left  09/04/2015  CLINICAL DATA:  Left knee pain for several months without known injury. EXAM: LEFT KNEE - 1-2 VIEW COMPARISON:  July 24, 2015. FINDINGS: There is no evidence of fracture, dislocation, or joint effusion. There is noted increased sclerosis involving the articular surface of the medial femoral condyle. Joint spaces appear intact. Vascular calcifications are noted. IMPRESSION: No acute fracture or dislocation is noted. Focal increased sclerosis is seen involving the articular surface of the medial femoral condyle which may represent early degenerative change. Electronically Signed   By: Marijo Conception, M.D.   On: 09/04/2015 09:09   Ct Head Wo Contrast  09/04/2015  CLINICAL DATA:  Weakness.  Nausea and vomiting. EXAM: CT HEAD WITHOUT CONTRAST TECHNIQUE: Contiguous axial images were obtained from the base of the skull through the vertex without intravenous contrast. COMPARISON:  07/23/2015 FINDINGS: There is no intracranial hemorrhage, mass or evidence of acute infarction. There is mild white matter hypodensity consistent with chronic microvascular ischemic change. There is no significant extra-axial fluid collection. No acute intracranial findings are evident. The calvarium and skullbase are intact. Visible paranasal sinuses and orbits are unremarkable.  IMPRESSION: No acute intracranial findings. There are chronic appearing white matter hypodensities which likely represent small vessel ischemic disease. Electronically Signed   By: Andreas Newport M.D.   On: 09/04/2015 05:27   US Abdomen Complete  09/04/2015  CLINICAL DATA:  Elevated liver function studies, onset of abdominal pain yesterday; history of alcoholic cirrhosis, hepatitis-C EXAM: ABDOMEN ULTRASOUND COMPLETE COMPARISON:  Abdominal ultrasound of April 26, 2013 FINDINGS: Gallbladder: The gallbladder is adequately distended. Echogenic sludge and mobile stones are present. The largest stone measures 2.2 cm in diameter. There is mild gallbladder wall thickening at 3.6 mm. There is no pericholecystic fluid or positive sonographic Murphy's sign. Common bile duct: Diameter: 5.2 mm Liver: There is heterogeneously increased hepatic echotexture. The surface contour of the liver is irregular. There is a solid-appearing hypoechoic mass in the right hepatic lobe measuring 3 x 2.4 x 2.6 cm. IVC: No abnormality visualized. Pancreas: No abnormality visualized Spleen: Size and appearance within normal limits. There is an accessory spleen measuring 1.8 cm in greatest dimension. Right Kidney: Length: 11.7 cm. The renal cortical echotexture remains lower than that of the adjacent liver. There is mild hydronephrosis. Left Kidney:  Length: 12.3 cm. Echogenicity within normal limits. No mass or hydronephrosis visualized. Abdominal aorta: Bowel gas limits evaluation of the abdominal aorta. Other findings: No ascites is observed. IMPRESSION: 1. Heterogeneously increased hepatic echotexture with irregularity of the hepatic contour consistent with known cirrhosis. Solid-appearing right lobe mass measuring 3 cm in greatest dimension. Further evaluation with hepatic protocol MRI is recommended. 2. Gallstones without sonographic evidence of acute cholecystitis. There is mild gallbladder wall thickening which has been observed in  the past. 3. Chronic mild right-sided hydronephrosis. Electronically Signed   By: David  Martinique M.D.   On: 09/04/2015 10:04   Dg Chest Port 1 View  09/03/2015  CLINICAL DATA:  Generalized weakness and cough for 3 days EXAM: PORTABLE CHEST 1 VIEW COMPARISON:  07/23/2015 FINDINGS: A single AP portable view of the chest demonstrates no focal airspace consolidation or alveolar edema. The lungs are grossly clear. There is no large effusion or pneumothorax. Cardiac and mediastinal contours appear unremarkable. IMPRESSION: No active disease. Electronically Signed   By: Andreas Newport M.D.   On: 09/03/2015 22:05       Filed Weights   09/04/15 0402 09/05/15 0601 09/05/15 0612  Weight: 78.9 kg (173 lb 15.1 oz) 78.8 kg (173 lb 11.6 oz) 80.3 kg (177 lb 0.5 oz)     Microbiology: No results found for this or any previous visit (from the past 240 hour(s)).     Blood Culture    Component Value Date/Time   SDES BLOOD LEFT ANTECUBITAL 07/23/2015 1940   SPECREQUEST BOTTLES DRAWN AEROBIC AND ANAEROBIC 5CC 07/23/2015 1940   CULT NO GROWTH 5 DAYS 07/23/2015 1940   REPTSTATUS 07/28/2015 FINAL 07/23/2015 1940      Labs: Results for orders placed or performed during the hospital encounter of 09/03/15 (from the past 48 hour(s))  Lipase, blood     Status: Abnormal   Collection Time: 09/03/15  2:08 PM  Result Value Ref Range   Lipase 60 (H) 11 - 51 U/L  Comprehensive metabolic panel     Status: Abnormal   Collection Time: 09/03/15  2:08 PM  Result Value Ref Range   Sodium 134 (L) 135 - 145 mmol/L   Potassium 4.2 3.5 - 5.1 mmol/L   Chloride 103 101 - 111 mmol/L   CO2 19 (L) 22 - 32 mmol/L   Glucose, Bld 164 (H) 65 - 99 mg/dL   BUN 27 (H) 6 - Jose mg/dL   Creatinine, Ser 1.59 (H) 0.61 - 1.24 mg/dL   Calcium 9.2 8.9 - 10.3 mg/dL   Total Protein 8.4 (H) 6.5 - 8.1 g/dL   Albumin 2.7 (L) 3.5 - 5.0 g/dL   AST 147 (H) 15 - 41 U/L   ALT 79 (H) 17 - 63 U/L   Alkaline Phosphatase 182 (H) 38 - 126 U/L    Total Bilirubin 3.5 (H) 0.3 - 1.2 mg/dL   GFR calc non Af Amer 46 (L) >60 mL/min   GFR calc Af Amer 53 (L) >60 mL/min    Comment: (NOTE) The eGFR has been calculated using the CKD EPI equation. This calculation has not been validated in all clinical situations. eGFR's persistently <60 mL/min signify possible Chronic Kidney Disease.    Anion gap 12 5 - 15  CBC     Status: Abnormal   Collection Time: 09/03/15  2:08 PM  Result Value Ref Range   WBC 4.6 4.0 - 10.5 K/uL   RBC 4.01 (L) 4.22 - 5.81 MIL/uL   Hemoglobin 14.1 13.0 -  17.0 g/dL   HCT 40.6 39.0 - 52.0 %   MCV 101.2 (H) 78.0 - 100.0 fL   MCH 35.2 (H) 26.0 - 34.0 pg   MCHC 34.7 30.0 - 36.0 g/dL   RDW 13.6 11.5 - 15.5 %   Platelets 65 (L) 150 - 400 K/uL    Comment: CONSISTENT WITH PREVIOUS RESULT  Urinalysis, Routine w reflex microscopic     Status: Abnormal   Collection Time: 09/03/15 11:00 PM  Result Value Ref Range   Color, Urine ORANGE (A) YELLOW    Comment: BIOCHEMICALS MAY BE AFFECTED BY COLOR   APPearance CLOUDY (A) CLEAR   Specific Gravity, Urine 1.025 1.005 - 1.030   pH 5.5 5.0 - 8.0   Glucose, UA NEGATIVE NEGATIVE mg/dL   Hgb urine dipstick MODERATE (A) NEGATIVE   Bilirubin Urine SMALL (A) NEGATIVE   Ketones, ur 15 (A) NEGATIVE mg/dL   Protein, ur NEGATIVE NEGATIVE mg/dL   Nitrite POSITIVE (A) NEGATIVE   Leukocytes, UA SMALL (A) NEGATIVE  Sodium, urine, random     Status: None   Collection Time: 09/03/15 11:00 PM  Result Value Ref Range   Sodium, Ur 43 mmol/L  Creatinine, urine, random     Status: None   Collection Time: 09/03/15 11:00 PM  Result Value Ref Range   Creatinine, Urine 271.64 mg/dL  Urine rapid drug screen (hosp performed)     Status: None   Collection Time: 09/03/15 11:00 PM  Result Value Ref Range   Opiates NONE DETECTED NONE DETECTED   Cocaine NONE DETECTED NONE DETECTED   Benzodiazepines NONE DETECTED NONE DETECTED   Amphetamines NONE DETECTED NONE DETECTED   Tetrahydrocannabinol NONE  DETECTED NONE DETECTED   Barbiturates NONE DETECTED NONE DETECTED    Comment:        DRUG SCREEN FOR MEDICAL PURPOSES ONLY.  IF CONFIRMATION IS NEEDED FOR ANY PURPOSE, NOTIFY LAB WITHIN 5 DAYS.        LOWEST DETECTABLE LIMITS FOR URINE DRUG SCREEN Drug Class       Cutoff (ng/mL) Amphetamine      1000 Barbiturate      200 Benzodiazepine   628 Tricyclics       315 Opiates          300 Cocaine          300 THC              50   Urine microscopic-add on     Status: Abnormal   Collection Time: 09/03/15 11:00 PM  Result Value Ref Range   Squamous Epithelial / LPF 6-30 (A) NONE SEEN   WBC, UA 0-5 0 - 5 WBC/hpf   RBC / HPF 6-30 0 - 5 RBC/hpf   Bacteria, UA MANY (A) NONE SEEN   Casts HYALINE CASTS (A) NEGATIVE  Protime-INR     Status: Abnormal   Collection Time: 09/04/15 12:00 AM  Result Value Ref Range   Prothrombin Time 16.2 (H) 11.6 - 15.2 seconds   INR 1.29 0.00 - 1.49  Lactic acid, plasma     Status: Abnormal   Collection Time: 09/04/15 12:00 AM  Result Value Ref Range   Lactic Acid, Venous 2.7 (HH) 0.5 - 2.0 mmol/L    Comment: CRITICAL RESULT CALLED TO, READ BACK BY AND VERIFIED WITH: OLLIFON,J RN 09/04/2015 0043 JORDANS   Troponin I     Status: Abnormal   Collection Time: 09/04/15  1:32 AM  Result Value Ref Range   Troponin I 0.05 (H) <  0.031 ng/mL    Comment:        PERSISTENTLY INCREASED TROPONIN VALUES IN THE RANGE OF 0.04-0.49 ng/mL CAN BE SEEN IN:       -UNSTABLE ANGINA       -CONGESTIVE HEART FAILURE       -MYOCARDITIS       -CHEST TRAUMA       -ARRYHTHMIAS       -LATE PRESENTING MYOCARDIAL INFARCTION       -COPD   CLINICAL FOLLOW-UP RECOMMENDED.   Ammonia     Status: Abnormal   Collection Time: 09/04/15  1:32 AM  Result Value Ref Range   Ammonia 142 (H) 9 - 35 umol/L  Basic metabolic panel     Status: Abnormal   Collection Time: 09/04/15  3:40 AM  Result Value Ref Range   Sodium 134 (L) 135 - 145 mmol/L   Potassium 3.9 3.5 - 5.1 mmol/L   Chloride  102 101 - 111 mmol/L   CO2 19 (L) 22 - 32 mmol/L   Glucose, Bld 155 (H) 65 - 99 mg/dL   BUN 26 (H) 6 - Jose mg/dL   Creatinine, Ser 1.40 (H) 0.61 - 1.24 mg/dL   Calcium 8.5 (L) 8.9 - 10.3 mg/dL   GFR calc non Af Amer 54 (L) >60 mL/min   GFR calc Af Amer >60 >60 mL/min    Comment: (NOTE) The eGFR has been calculated using the CKD EPI equation. This calculation has not been validated in all clinical situations. eGFR's persistently <60 mL/min signify possible Chronic Kidney Disease.    Anion gap 13 5 - 15  CBC     Status: Abnormal   Collection Time: 09/04/15  3:40 AM  Result Value Ref Range   WBC 4.8 4.0 - 10.5 K/uL   RBC 3.79 (L) 4.22 - 5.81 MIL/uL   Hemoglobin 13.5 13.0 - 17.0 g/dL   HCT 38.8 (L) 39.0 - 52.0 %   MCV 102.4 (H) 78.0 - 100.0 fL   MCH 35.6 (H) 26.0 - 34.0 pg   MCHC 34.8 30.0 - 36.0 g/dL   RDW 13.7 11.5 - 15.5 %   Platelets 44 (L) 150 - 400 K/uL    Comment: CONSISTENT WITH PREVIOUS RESULT  Hepatic function panel     Status: Abnormal   Collection Time: 09/04/15  3:40 AM  Result Value Ref Range   Total Protein 7.3 6.5 - 8.1 g/dL   Albumin 2.4 (L) 3.5 - 5.0 g/dL   AST 132 (H) 15 - 41 U/L   ALT 70 (H) 17 - 63 U/L   Alkaline Phosphatase 137 (H) 38 - 126 U/L   Total Bilirubin 3.3 (H) 0.3 - 1.2 mg/dL   Bilirubin, Direct 1.2 (H) 0.1 - 0.5 mg/dL   Indirect Bilirubin 2.1 (H) 0.3 - 0.9 mg/dL  Troponin I (q 6hr x 3)     Status: Abnormal   Collection Time: 09/04/15  3:40 AM  Result Value Ref Range   Troponin I 0.05 (H) <0.031 ng/mL    Comment:        PERSISTENTLY INCREASED TROPONIN VALUES IN THE RANGE OF 0.04-0.49 ng/mL CAN BE SEEN IN:       -UNSTABLE ANGINA       -CONGESTIVE HEART FAILURE       -MYOCARDITIS       -CHEST TRAUMA       -ARRYHTHMIAS       -LATE PRESENTING MYOCARDIAL INFARCTION       -COPD  CLINICAL FOLLOW-UP RECOMMENDED.   Lactic acid, plasma     Status: Abnormal   Collection Time: 09/04/15  3:40 AM  Result Value Ref Range   Lactic Acid, Venous  2.3 (HH) 0.5 - 2.0 mmol/L    Comment: CRITICAL RESULT CALLED TO, READ BACK BY AND VERIFIED WITH: THACKER,J RN 09/04/2015 0434 JORDANS   Lactic acid, plasma     Status: None   Collection Time: 09/04/15  6:12 AM  Result Value Ref Range   Lactic Acid, Venous 1.6 0.5 - 2.0 mmol/L  Troponin I (q 6hr x 3)     Status: Abnormal   Collection Time: 09/04/15  8:05 AM  Result Value Ref Range   Troponin I 0.06 (H) <0.031 ng/mL    Comment:        PERSISTENTLY INCREASED TROPONIN VALUES IN THE RANGE OF 0.04-0.49 ng/mL CAN BE SEEN IN:       -UNSTABLE ANGINA       -CONGESTIVE HEART FAILURE       -MYOCARDITIS       -CHEST TRAUMA       -ARRYHTHMIAS       -LATE PRESENTING MYOCARDIAL INFARCTION       -COPD   CLINICAL FOLLOW-UP RECOMMENDED.   Troponin I (q 6hr x 3)     Status: Abnormal   Collection Time: 09/04/15  2:25 PM  Result Value Ref Range   Troponin I 0.05 (H) <0.031 ng/mL    Comment:        PERSISTENTLY INCREASED TROPONIN VALUES IN THE RANGE OF 0.04-0.49 ng/mL CAN BE SEEN IN:       -UNSTABLE ANGINA       -CONGESTIVE HEART FAILURE       -MYOCARDITIS       -CHEST TRAUMA       -ARRYHTHMIAS       -LATE PRESENTING MYOCARDIAL INFARCTION       -COPD   CLINICAL FOLLOW-UP RECOMMENDED.   Magnesium     Status: None   Collection Time: 09/04/15  2:25 PM  Result Value Ref Range   Magnesium 2.2 1.7 - 2.4 mg/dL  Comprehensive metabolic panel     Status: Abnormal   Collection Time: 09/05/15  6:17 AM  Result Value Ref Range   Sodium 133 (L) 135 - 145 mmol/L   Potassium 4.3 3.5 - 5.1 mmol/L   Chloride 102 101 - 111 mmol/L   CO2 21 (L) 22 - 32 mmol/L   Glucose, Bld 106 (H) 65 - 99 mg/dL   BUN 16 6 - Jose mg/dL   Creatinine, Ser 0.99 0.61 - 1.24 mg/dL   Calcium 8.4 (L) 8.9 - 10.3 mg/dL   Total Protein 6.8 6.5 - 8.1 g/dL   Albumin 2.1 (L) 3.5 - 5.0 g/dL   AST 116 (H) 15 - 41 U/L   ALT 66 (H) 17 - 63 U/L   Alkaline Phosphatase 138 (H) 38 - 126 U/L   Total Bilirubin 2.1 (H) 0.3 - 1.2 mg/dL    GFR calc non Af Amer >60 >60 mL/min   GFR calc Af Amer >60 >60 mL/min    Comment: (NOTE) The eGFR has been calculated using the CKD EPI equation. This calculation has not been validated in all clinical situations. eGFR's persistently <60 mL/min signify possible Chronic Kidney Disease.    Anion gap 10 5 - 15  CBC     Status: Abnormal   Collection Time: 09/05/15  6:17 AM  Result Value Ref Range   WBC 4.1  4.0 - 10.5 K/uL   RBC 3.36 (L) 4.22 - 5.81 MIL/uL   Hemoglobin 12.1 (L) 13.0 - 17.0 g/dL    Comment: REPEATED TO VERIFY   HCT 34.8 (L) 39.0 - 52.0 %   MCV 103.6 (H) 78.0 - 100.0 fL   MCH 36.0 (H) 26.0 - 34.0 pg   MCHC 34.8 30.0 - 36.0 g/dL   RDW 13.5 11.5 - 15.5 %   Platelets 46 (L) 150 - 400 K/uL    Comment: SPECIMEN CHECKED FOR CLOTS REPEATED TO VERIFY      Lipid Panel     Component Value Date/Time   CHOL 95 06/06/2013 1211   TRIG 94 06/06/2013 1211   HDL 32* 06/06/2013 1211   CHOLHDL 3.0 06/06/2013 1211   VLDL 19 06/06/2013 1211   LDLCALC 44 06/06/2013 1211     No results found for: HGBA1C   Lab Results  Component Value Date   LDLCALC 44 06/06/2013   CREATININE 0.99 09/05/2015     60 y.o. [name] with medical history significant of cirrhosis of liver, alcohol abuse and has been not drinking alcohol for last 2 months, hepatitis C, chronic thrombocytopenia presents to the ER because of weakness. Patient states over the last 2-3 days patient has been feeling weak and lethargic. He has been sleeping more than usual. Creatinine has increased from baseline and was found to be 1.59. LFTs are also elevated from baseline. Patient states he has not been drinking alcohol for last 2 months. Still has some complaints on his left knee which has been chronic at this time. Patient admitted for hepatic encephalopathy. Ammonia 142  Assessment and plan   #1. Acute renal failure - probably from poor oral intake and diuretics. Aldactone and Lasix held initially, Maxzide also held  initially. Patient hydrated gently with IV fluids. Patient did receive fluid bolus of 500 mL in the ER. Creatinine 0.99 prior to discharge. Okay to resume diuretics in the next couple of days, continue to hold Maxide   #2. Hepatic encephalopathy - patient was lethargic. Ammonia levels 142, repeat ammonia level pending . Increased lactulose 30 mL to 3 times a day from 2 times a day. Patient also may require Xifaxan. Follow ammonia levels in the outpatient setting. CT head negative  #3. Transaminitis, elevated lipase -ultrasound shows cirrhosis, solid-appearing right lobe mass measuring 3 cm, MRI recommended. Gallstones without acute cholecystitis, chronic mild right-sided hydronephrosis. Started   empirically on ceftriaxone. Patient likely has chronically elevated lipase, lipase was 60 this admission. Total bilirubin has improved and was found to be 2.1 on the day of discharge. Patient was also found to have mildly elevated alkaline phosphatase With mildly elevated bilirubin and alkaline phosphatase, concern for obstructive jaundice/cholangiocarcinoma, please order MRCP /MRI in the outpatient setting  #4. Cirrhosis of the liver secondary to hepatitis C and alcohol abuse - Patient states he has not had any alcohol for last 2 months. Patient will need follow-up with infectious disease clinic for treatment for hepatitis C. resuming diuretics at a lower dose . No ascites,  #5. Generalized weakness secondary to acute renal failure and hepatic encephalopathy. Slightly abnormal but flat troponins elevated lactic acid levels likely due to cirrhosis improved with hydration. Physical therapy recommends SNF.  #6. Chronic left knee pain - shows early degenerative changes.  #7. Chronic thrombocytopenia secondary to hepatitis C and cirrhosis of the liver - follow CBC. Follow-up with GI in the outpatient setting  #8. Possible UTI - patient is on ceftriaxone.  urine cultures. No growth so far, Now switched to  Keflex 5 days    Discharge Exam:   Blood pressure 120/75, pulse 60, temperature 98.4 F (36.9 C), temperature source Oral, resp. rate 18, weight 80.3 kg (177 lb 0.5 oz), SpO2 100 %. General exam: Appears calm and comfortable  Respiratory system: Clear to auscultation. Respiratory effort normal. Cardiovascular system: S1 & S2 heard, RRR. No JVD, murmurs, rubs, gallops or clicks. No pedal edema. Gastrointestinal system: Abdomen is nondistended, soft and nontender. No organomegaly or masses felt. Normal bowel sounds heard. Central nervous system: Alert and oriented. No focal neurological deficits. Extremities: Symmetric 5 x 5 power. Skin: No rashes, lesions or ulcers Psychiatry: Judgement and insight appear normal. Mood & affect appropriate     Follow-up Information    Follow up with [facility], NP. Schedule an appointment as soon as possible for a visit in 3 days.   Contact information:   [address] [place] [address] [phone]       Signed: Reyne Dumas 09/05/2015, 9:19 AM        Time spent >45 mins

## 2015-09-06 ENCOUNTER — Inpatient Hospital Stay (HOSPITAL_COMMUNITY): Payer: Medicaid Other

## 2015-09-06 DIAGNOSIS — Z01810 Encounter for preprocedural cardiovascular examination: Secondary | ICD-10-CM

## 2015-09-06 DIAGNOSIS — K7031 Alcoholic cirrhosis of liver with ascites: Secondary | ICD-10-CM

## 2015-09-06 LAB — ECHOCARDIOGRAM COMPLETE: WEIGHTICAEL: 2832.47 [oz_av]

## 2015-09-06 LAB — IRON AND TIBC
IRON: 157 ug/dL (ref 45–182)
Saturation Ratios: 84 % — ABNORMAL HIGH (ref 17.9–39.5)
TIBC: 188 ug/dL — ABNORMAL LOW (ref 250–450)
UIBC: 31 ug/dL

## 2015-09-06 LAB — CBC
HCT: 29.5 % — ABNORMAL LOW (ref 39.0–52.0)
HCT: 32.5 % — ABNORMAL LOW (ref 39.0–52.0)
Hemoglobin: 10.4 g/dL — ABNORMAL LOW (ref 13.0–17.0)
Hemoglobin: 11.4 g/dL — ABNORMAL LOW (ref 13.0–17.0)
MCH: 36.4 pg — AB (ref 26.0–34.0)
MCH: 36.1 pg — ABNORMAL HIGH (ref 26.0–34.0)
MCHC: 35.3 g/dL (ref 30.0–36.0)
MCHC: 35.1 g/dL (ref 30.0–36.0)
MCV: 103.1 fL — ABNORMAL HIGH (ref 78.0–100.0)
MCV: 102.8 fL — AB (ref 78.0–100.0)
PLATELETS: 36 10*3/uL — AB (ref 150–400)
PLATELETS: 41 10*3/uL — AB (ref 150–400)
RBC: 2.86 MIL/uL — ABNORMAL LOW (ref 4.22–5.81)
RBC: 3.16 MIL/uL — ABNORMAL LOW (ref 4.22–5.81)
RDW: 13.3 % (ref 11.5–15.5)
RDW: 13.3 % (ref 11.5–15.5)
WBC: 3.2 10*3/uL — ABNORMAL LOW (ref 4.0–10.5)
WBC: 3.6 10*3/uL — AB (ref 4.0–10.5)

## 2015-09-06 LAB — VITAMIN B12: Vitamin B-12: 1377 pg/mL — ABNORMAL HIGH (ref 180–914)

## 2015-09-06 LAB — COMPREHENSIVE METABOLIC PANEL
ALT: 61 U/L (ref 17–63)
ANION GAP: 9 (ref 5–15)
AST: 112 U/L — ABNORMAL HIGH (ref 15–41)
Albumin: 2 g/dL — ABNORMAL LOW (ref 3.5–5.0)
Alkaline Phosphatase: 143 U/L — ABNORMAL HIGH (ref 38–126)
BUN: 15 mg/dL (ref 6–20)
CALCIUM: 8.2 mg/dL — AB (ref 8.9–10.3)
CHLORIDE: 102 mmol/L (ref 101–111)
CO2: 19 mmol/L — AB (ref 22–32)
Creatinine, Ser: 1.02 mg/dL (ref 0.61–1.24)
GFR calc non Af Amer: >60 mL/min (ref >60)
Glucose, Bld: 119 mg/dL — ABNORMAL HIGH (ref 65–99)
Potassium: 4.1 mmol/L (ref 3.5–5.1)
SODIUM: 130 mmol/L — AB (ref 135–145)
Total Bilirubin: 1.8 mg/dL — ABNORMAL HIGH (ref 0.3–1.2)
Total Protein: 6.3 g/dL — ABNORMAL LOW (ref 6.5–8.1)

## 2015-09-06 LAB — RETICULOCYTES
RBC.: 3.16 MIL/uL — AB (ref 4.22–5.81)
RETIC COUNT ABSOLUTE: 75.8 10*3/uL (ref 19.0–186.0)
Retic Ct Pct: 2.4 % (ref 0.4–3.1)

## 2015-09-06 LAB — AFP TUMOR MARKER: AFP TUMOR MARKER: 34.4 ng/mL — AB (ref 0.0–8.3)

## 2015-09-06 LAB — FOLATE: FOLATE: 20.8 ng/mL (ref >5.9)

## 2015-09-06 LAB — FERRITIN: FERRITIN: 1295 ng/mL — AB (ref 24–336)

## 2015-09-06 MED ORDER — FENTANYL CITRATE (PF) 100 MCG/2ML IJ SOLN
INTRAMUSCULAR | Status: AC
  Filled 2015-09-06: qty 2

## 2015-09-06 MED ORDER — GELATIN ABSORBABLE 12-7 MM EX MISC
CUTANEOUS | Status: AC
  Filled 2015-09-06: qty 1

## 2015-09-06 MED ORDER — SODIUM CHLORIDE 0.9 % IV SOLN
INTRAVENOUS | Status: AC | PRN
  Administered 2015-09-06: 10 mL/h via INTRAVENOUS

## 2015-09-06 MED ORDER — LIDOCAINE-EPINEPHRINE 1 %-1:100000 IJ SOLN
INTRAMUSCULAR | Status: AC
  Filled 2015-09-06: qty 1

## 2015-09-06 MED ORDER — MIDAZOLAM HCL 2 MG/2ML IJ SOLN
INTRAMUSCULAR | Status: AC
  Filled 2015-09-06: qty 4

## 2015-09-06 MED ORDER — FENTANYL CITRATE (PF) 100 MCG/2ML IJ SOLN
INTRAMUSCULAR | Status: AC | PRN
  Administered 2015-09-06 (×2): 25 ug via INTRAVENOUS

## 2015-09-06 MED ORDER — MIDAZOLAM HCL 2 MG/2ML IJ SOLN
INTRAMUSCULAR | Status: AC | PRN
  Administered 2015-09-06 (×2): 0.5 mg via INTRAVENOUS

## 2015-09-06 NOTE — Procedures (Signed)
Technically successful US guided biopsy of indeterminate hepatic mass.   EBL: Minimal   No immediate complications.   Ronny Bacon, MD Pager #: [phone]

## 2015-09-06 NOTE — Progress Notes (Signed)
[facility] PROGRESS NOTE  Jose [name] [id number] DOB: [birth year] DOA: 09/03/2015   PCP: Carmie Kanner, NP     Assessment/Plan: Principal Problem:   AKI (acute kidney injury) ([place]) Active Problems:   Thrombocytopenia, unspecified (HCC)   Hepatic encephalopathy ([place])   Weakness   Cirrhosis of liver with ascites (HCC)   Elevated LFTs   60 y.o. male with medical history significant of cirrhosis of liver, alcohol abuse and has been not drinking alcohol for last 2 months, hepatitis C, chronic thrombocytopenia presents to the ER because of weakness. Patient states over the last 2-3 days patient has been feeling weak and lethargic. He has been sleeping more than usual.   Creatinine has increased from baseline and is around 1.5 now. LFTs are also elevated from baseline. Patient states he has not been drinking alcohol for last 2 months. Still has some complaints on his left knee which has been chronic at this time. Patient admitted for hepatic encephalopathy. Ammonia 142  Assessment and plan   #1. Acute renal failure - probably from poor oral intake and diuretics. Aldactone and Lasix held initially, Maxzide also held initially. Patient hydrated gently with IV fluids. Patient did receive fluid bolus of 500 mL in the ER. Creatinine 0.99 prior to discharge. Okay to resume diuretics in the next couple of days, continue to hold Maxide   #2. Hepatic encephalopathy - patient was lethargic. Ammonia levels 142, repeat ammonia level 47  . Increased lactulose 30 mL to 3 times a day from 2 times a day. Patient also may require Xifaxan. Follow ammonia levels in the outpatient setting. CT head negative  #3. Transaminitis, elevated lipase -ultrasound shows cirrhosis, solid-appearing right lobe mass measuring 3 cm, MRI recommended. Gallstones without acute cholecystitis, chronic mild right-sided hydronephrosis. Started empirically on ceftriaxone. Patient likely has chronically elevated lipase,  lipase was 60 this admission. Total bilirubin has improved and was found to be 2.1 on the day of discharge. Patient was also found to have mildly elevated alkaline phosphatase With mildly elevated bilirubin and alkaline phosphatase, concern for obstructive jaundice/cholangiocarcinoma, please order MRCP /MRI showed 2.8 cm mass, elevated alpha fetoprotein 34.4. As per general surgery hepatologist Dr. Barry Dienes  , patient is not a transplant or a surgical candidate, and further decision regarding surgery needs to be made in the outpatient setting. Interventional radiology consulted for ultrasound-guided biopsy after completion of platelet transfusion. Patient should   be monitored overnight , and if stable discharge to SNF tomorrow  #4. Cirrhosis of the liver secondary to hepatitis C and alcohol abuse - Patient states he has not had any alcohol for last 2 months. Patient will need follow-up with infectious disease clinic for treatment for hepatitis C. resuming diuretics at a lower dose . No ascites,  #5. Generalized weakness secondary to acute renal failure and hepatic encephalopathy. Slightly abnormal but flat troponins elevated lactic acid levels likely due to cirrhosis improved with hydration. Physical therapy recommends SNF.  #6. Chronic left knee pain - shows early degenerative changes.  #7. Chronic thrombocytopenia secondary to hepatitis C and cirrhosis of the liver - follow CBC. Follow-up with GI in the outpatient setting. Administering one unit of platelets prior to this biopsy  #8. Possible UTI - patient is on ceftriaxone. urine cultures. No growth so far, Now switched to Keflex 5 days  DVT prophylaxsis SCDs  Code Status:  Full code    Family Communication: Discussed in detail with the patient, all imaging results, lab results  explained to the patient   Disposition Plan: Discharge to SNF tomorrow if stable overnight after biopsy     Consultants:  General surgery  Interventional  radiology  Procedures:  None  Antibiotics: Rocephin 5/9-5/10 Keflex 5/10-       HPI/Subjective: Alert and oriented , denies nausea and vomiting   Objective: Filed Vitals:   09/05/15 0612 09/05/15 1351 09/05/15 2127 09/06/15 0510  BP:  144/74 133/69 121/69  Pulse:  69 69 71  Temp:  98.1 F (36.7 C) 98.7 F (37.1 C) 99 F (37.2 C)  TempSrc:  Oral Oral Oral  Resp:    18  Weight: 80.3 kg (177 lb 0.5 oz)     SpO2:  100% 100% 100%    Intake/Output Summary (Last 24 hours) at 09/06/15 1011 Last data filed at 09/05/15 1300  Gross per 24 hour  Intake      0 ml  Output    600 ml  Net   -600 ml    Exam:  Examination:  General exam: Appears calm and comfortable  Respiratory system: Clear to auscultation. Respiratory effort normal. Cardiovascular system: S1 & S2 heard, RRR. No JVD, murmurs, rubs, gallops or clicks. No pedal edema. Gastrointestinal system: Abdomen is nondistended, soft and nontender. No organomegaly or masses felt. Normal bowel sounds heard. Central nervous system: Alert and oriented. No focal neurological deficits. Extremities: Symmetric 5 x 5 power. Skin: No rashes, lesions or ulcers Psychiatry: Judgement and insight appear normal. Mood & affect appropriate.     Data Reviewed: I have personally reviewed following labs and imaging studies  Micro Results No results found for this or any previous visit (from the past 240 hour(s)).  Radiology Reports Dg Knee 1-2 Views Left  09/04/2015  CLINICAL DATA:  Left knee pain for several months without known injury. EXAM: LEFT KNEE - 1-2 VIEW COMPARISON:  July 24, 2015. FINDINGS: There is no evidence of fracture, dislocation, or joint effusion. There is noted increased sclerosis involving the articular surface of the medial femoral condyle. Joint spaces appear intact. Vascular calcifications are noted. IMPRESSION: No acute fracture or dislocation is noted. Focal increased sclerosis is seen involving the articular  surface of the medial femoral condyle which may represent early degenerative change. Electronically Signed   By: Marijo Conception, M.D.   On: 09/04/2015 09:09   Ct Head Wo Contrast  09/04/2015  CLINICAL DATA:  Weakness.  Nausea and vomiting. EXAM: CT HEAD WITHOUT CONTRAST TECHNIQUE: Contiguous axial images were obtained from the base of the skull through the vertex without intravenous contrast. COMPARISON:  07/23/2015 FINDINGS: There is no intracranial hemorrhage, mass or evidence of acute infarction. There is mild white matter hypodensity consistent with chronic microvascular ischemic change. There is no significant extra-axial fluid collection. No acute intracranial findings are evident. The calvarium and skullbase are intact. Visible paranasal sinuses and orbits are unremarkable. IMPRESSION: No acute intracranial findings. There are chronic appearing white matter hypodensities which likely represent small vessel ischemic disease. Electronically Signed   By: Andreas Newport M.D.   On: 09/04/2015 05:27   US Abdomen Complete  09/04/2015  CLINICAL DATA:  Elevated liver function studies, onset of abdominal pain yesterday; history of alcoholic cirrhosis, hepatitis-C EXAM: ABDOMEN ULTRASOUND COMPLETE COMPARISON:  Abdominal ultrasound of April 26, 2013 FINDINGS: Gallbladder: The gallbladder is adequately distended. Echogenic sludge and mobile stones are present. The largest stone measures 2.2 cm in diameter. There is mild gallbladder wall thickening at 3.6 mm. There is no pericholecystic fluid or  positive sonographic Murphy's sign. Common bile duct: Diameter: 5.2 mm Liver: There is heterogeneously increased hepatic echotexture. The surface contour of the liver is irregular. There is a solid-appearing hypoechoic mass in the right hepatic lobe measuring 3 x 2.4 x 2.6 cm. IVC: No abnormality visualized. Pancreas: No abnormality visualized Spleen: Size and appearance within normal limits. There is an accessory spleen  measuring 1.8 cm in greatest dimension. Right Kidney: Length: 11.7 cm. The renal cortical echotexture remains lower than that of the adjacent liver. There is mild hydronephrosis. Left Kidney: Length: 12.3 cm. Echogenicity within normal limits. No mass or hydronephrosis visualized. Abdominal aorta: Bowel gas limits evaluation of the abdominal aorta. Other findings: No ascites is observed. IMPRESSION: 1. Heterogeneously increased hepatic echotexture with irregularity of the hepatic contour consistent with known cirrhosis. Solid-appearing right lobe mass measuring 3 cm in greatest dimension. Further evaluation with hepatic protocol MRI is recommended. 2. Gallstones without sonographic evidence of acute cholecystitis. There is mild gallbladder wall thickening which has been observed in the past. 3. Chronic mild right-sided hydronephrosis. Electronically Signed   By: David  Martinique M.D.   On: 09/04/2015 10:04   Mr 3d Recon At Scanner  09/05/2015  CLINICAL DATA:  Cirrhosis with hepatic mass seen on recent ultrasound. EXAM: MRI ABDOMEN WITHOUT AND WITH CONTRAST (INCLUDING MRCP) TECHNIQUE: Multiplanar multisequence MR imaging of the abdomen was performed both before and after the administration of intravenous contrast. Heavily T2-weighted images of the biliary and pancreatic ducts were obtained, and three-dimensional MRCP images were rendered by post processing. CONTRAST:  8 cc Eovist COMPARISON:  Ultrasound 09/04/2015 FINDINGS: Examination is limited by respiratory motion. Also, I do not have early arterial phase imaging. There is a 2.8 cm segment 7 hepatic lesion which has slight increased T2 signal intensity. This is bright on the T1 weighted gradient echo sequences surrounded by a background of diffuse fatty infiltration. The contrast enhanced images wall appear the same. Contrast enhancement is difficult to assess because the lesion is bright on T1. Extremely low T2 signal intensity in the liver and pancreas suggest  iron deposition. The spleen appears normal. The gallbladder demonstrates gallstones, wall thickening and pericholecystic fluid. The common bile duct is normal in caliber and has a normal course. No common bile duct stones. The adrenal glands and kidneys are unremarkable. No mesenteric or retroperitoneal mass or adenopathy. Extensive portal venous collaterals due to portal hypertension. Esophageal varices are also noted. The portal vein is patent. Small amount of perihepatic ascites. No significant bony findings. IMPRESSION: 1. Limited examination due to timing of the contrast administration and lack of precontrast LAVA sequence. There is a T1 bright 2.8 cm lesion in segment 7 of the liver. Contrast enhancement is difficult to assess but this is considered a hepatoma. Recommend ultrasound-guided biopsy. 2. Advanced cirrhotic changes with portal venous hypertension, portal venous collaterals and esophageal varices. 3. Suspect iron deposition in liver and pancreas. 4. Gallstones, gallbladder wall thickening and pericholecystic fluid. Electronically Signed   By: Marijo Sanes M.D.   On: 09/05/2015 13:44   Dg Chest Port 1 View  09/03/2015  CLINICAL DATA:  Generalized weakness and cough for 3 days EXAM: PORTABLE CHEST 1 VIEW COMPARISON:  07/23/2015 FINDINGS: A single AP portable view of the chest demonstrates no focal airspace consolidation or alveolar edema. The lungs are grossly clear. There is no large effusion or pneumothorax. Cardiac and mediastinal contours appear unremarkable. IMPRESSION: No active disease. Electronically Signed   By: Andreas Newport M.D.   On: 09/03/2015  22:05   Mr Jeananne Rama W/wo Cm/mrcp  09/05/2015  CLINICAL DATA:  Cirrhosis with hepatic mass seen on recent ultrasound. EXAM: MRI ABDOMEN WITHOUT AND WITH CONTRAST (INCLUDING MRCP) TECHNIQUE: Multiplanar multisequence MR imaging of the abdomen was performed both before and after the administration of intravenous contrast. Heavily T2-weighted images  of the biliary and pancreatic ducts were obtained, and three-dimensional MRCP images were rendered by post processing. CONTRAST:  8 cc Eovist COMPARISON:  Ultrasound 09/04/2015 FINDINGS: Examination is limited by respiratory motion. Also, I do not have early arterial phase imaging. There is a 2.8 cm segment 7 hepatic lesion which has slight increased T2 signal intensity. This is bright on the T1 weighted gradient echo sequences surrounded by a background of diffuse fatty infiltration. The contrast enhanced images wall appear the same. Contrast enhancement is difficult to assess because the lesion is bright on T1. Extremely low T2 signal intensity in the liver and pancreas suggest iron deposition. The spleen appears normal. The gallbladder demonstrates gallstones, wall thickening and pericholecystic fluid. The common bile duct is normal in caliber and has a normal course. No common bile duct stones. The adrenal glands and kidneys are unremarkable. No mesenteric or retroperitoneal mass or adenopathy. Extensive portal venous collaterals due to portal hypertension. Esophageal varices are also noted. The portal vein is patent. Small amount of perihepatic ascites. No significant bony findings. IMPRESSION: 1. Limited examination due to timing of the contrast administration and lack of precontrast LAVA sequence. There is a T1 bright 2.8 cm lesion in segment 7 of the liver. Contrast enhancement is difficult to assess but this is considered a hepatoma. Recommend ultrasound-guided biopsy. 2. Advanced cirrhotic changes with portal venous hypertension, portal venous collaterals and esophageal varices. 3. Suspect iron deposition in liver and pancreas. 4. Gallstones, gallbladder wall thickening and pericholecystic fluid. Electronically Signed   By: Marijo Sanes M.D.   On: 09/05/2015 13:44     CBC  Recent Labs Lab 09/03/15 1408 09/04/15 0340 09/05/15 0617 09/06/15 0537  WBC 4.6 4.8 4.1 3.2*  HGB 14.1 13.5 12.1* 10.4*   HCT 40.6 38.8* 34.8* 29.5*  PLT 65* 44* 46* 36*  MCV 101.2* 102.4* 103.6* 103.1*  MCH 35.2* 35.6* 36.0* 36.4*  MCHC 34.7 34.8 34.8 35.3  RDW 13.6 13.7 13.5 13.3    Chemistries   Recent Labs Lab 09/03/15 1408 09/04/15 0340 09/04/15 1425 09/05/15 0617 09/06/15 0537  NA 134* 134*  --  133* 130*  K 4.2 3.9  --  4.3 4.1  CL 103 102  --  102 102  CO2 19* 19*  --  21* 19*  GLUCOSE 164* 155*  --  106* 119*  BUN 27* 26*  --  16 15  CREATININE 1.59* 1.40*  --  0.99 1.02  CALCIUM 9.2 8.5*  --  8.4* 8.2*  MG  --   --  2.2  --   --   AST 147* 132*  --  116* 112*  ALT 79* 70*  --  66* 61  ALKPHOS 182* 137*  --  138* 143*  BILITOT 3.5* 3.3*  --  2.1* 1.8*   ------------------------------------------------------------------------------------------------------------------ estimated creatinine clearance is 77.6 mL/min (by C-G formula based on Cr of 1.02). ------------------------------------------------------------------------------------------------------------------ No results for input(s): HGBA1C in the last 72 hours. ------------------------------------------------------------------------------------------------------------------ No results for input(s): CHOL, HDL, LDLCALC, TRIG, CHOLHDL, LDLDIRECT in the last 72 hours. ------------------------------------------------------------------------------------------------------------------ No results for input(s): TSH, T4TOTAL, T3FREE, THYROIDAB in the last 72 hours.  Invalid input(s): FREET3 ------------------------------------------------------------------------------------------------------------------ No results for input(s):  VITAMINB12, FOLATE, FERRITIN, TIBC, IRON, RETICCTPCT in the last 72 hours.  Coagulation profile  Recent Labs Lab 09/04/15  INR 1.29    No results for input(s): DDIMER in the last 72 hours.  Cardiac Enzymes  Recent Labs Lab 09/04/15 0340 09/04/15 0805 09/04/15 1425  TROPONINI 0.05* 0.06* 0.05*    ------------------------------------------------------------------------------------------------------------------ Invalid input(s): POCBNP   CBG: No results for input(s): GLUCAP in the last 168 hours.     Studies: Mr 3d Recon At Scanner  09-13-2015  CLINICAL DATA:  Cirrhosis with hepatic mass seen on recent ultrasound. EXAM: MRI ABDOMEN WITHOUT AND WITH CONTRAST (INCLUDING MRCP) TECHNIQUE: Multiplanar multisequence MR imaging of the abdomen was performed both before and after the administration of intravenous contrast. Heavily T2-weighted images of the biliary and pancreatic ducts were obtained, and three-dimensional MRCP images were rendered by post processing. CONTRAST:  8 cc Eovist COMPARISON:  Ultrasound 09/04/2015 FINDINGS: Examination is limited by respiratory motion. Also, I do not have early arterial phase imaging. There is a 2.8 cm segment 7 hepatic lesion which has slight increased T2 signal intensity. This is bright on the T1 weighted gradient echo sequences surrounded by a background of diffuse fatty infiltration. The contrast enhanced images wall appear the same. Contrast enhancement is difficult to assess because the lesion is bright on T1. Extremely low T2 signal intensity in the liver and pancreas suggest iron deposition. The spleen appears normal. The gallbladder demonstrates gallstones, wall thickening and pericholecystic fluid. The common bile duct is normal in caliber and has a normal course. No common bile duct stones. The adrenal glands and kidneys are unremarkable. No mesenteric or retroperitoneal mass or adenopathy. Extensive portal venous collaterals due to portal hypertension. Esophageal varices are also noted. The portal vein is patent. Small amount of perihepatic ascites. No significant bony findings. IMPRESSION: 1. Limited examination due to timing of the contrast administration and lack of precontrast LAVA sequence. There is a T1 bright 2.8 cm lesion in segment 7 of the  liver. Contrast enhancement is difficult to assess but this is considered a hepatoma. Recommend ultrasound-guided biopsy. 2. Advanced cirrhotic changes with portal venous hypertension, portal venous collaterals and esophageal varices. 3. Suspect iron deposition in liver and pancreas. 4. Gallstones, gallbladder wall thickening and pericholecystic fluid. Electronically Signed   By: Marijo Sanes M.D.   On: 09/13/15 13:44   Mr Jeananne Rama W/wo Cm/mrcp  09-13-2015  CLINICAL DATA:  Cirrhosis with hepatic mass seen on recent ultrasound. EXAM: MRI ABDOMEN WITHOUT AND WITH CONTRAST (INCLUDING MRCP) TECHNIQUE: Multiplanar multisequence MR imaging of the abdomen was performed both before and after the administration of intravenous contrast. Heavily T2-weighted images of the biliary and pancreatic ducts were obtained, and three-dimensional MRCP images were rendered by post processing. CONTRAST:  8 cc Eovist COMPARISON:  Ultrasound 09/04/2015 FINDINGS: Examination is limited by respiratory motion. Also, I do not have early arterial phase imaging. There is a 2.8 cm segment 7 hepatic lesion which has slight increased T2 signal intensity. This is bright on the T1 weighted gradient echo sequences surrounded by a background of diffuse fatty infiltration. The contrast enhanced images wall appear the same. Contrast enhancement is difficult to assess because the lesion is bright on T1. Extremely low T2 signal intensity in the liver and pancreas suggest iron deposition. The spleen appears normal. The gallbladder demonstrates gallstones, wall thickening and pericholecystic fluid. The common bile duct is normal in caliber and has a normal course. No common bile duct stones. The adrenal glands and kidneys are unremarkable.  No mesenteric or retroperitoneal mass or adenopathy. Extensive portal venous collaterals due to portal hypertension. Esophageal varices are also noted. The portal vein is patent. Small amount of perihepatic ascites. No  significant bony findings. IMPRESSION: 1. Limited examination due to timing of the contrast administration and lack of precontrast LAVA sequence. There is a T1 bright 2.8 cm lesion in segment 7 of the liver. Contrast enhancement is difficult to assess but this is considered a hepatoma. Recommend ultrasound-guided biopsy. 2. Advanced cirrhotic changes with portal venous hypertension, portal venous collaterals and esophageal varices. 3. Suspect iron deposition in liver and pancreas. 4. Gallstones, gallbladder wall thickening and pericholecystic fluid. Electronically Signed   By: Marijo Sanes M.D.   On: 09/05/2015 13:44      No results found for: HGBA1C Lab Results  Component Value Date   LDLCALC 44 06/06/2013   CREATININE 1.02 09/06/2015       Scheduled Meds: . sodium chloride   Intravenous Once  . folic acid  1 mg Oral Daily  . lactulose  30 g Oral TID  . magnesium oxide  400 mg Oral Daily  . multivitamin with minerals  1 tablet Oral Daily  . pantoprazole  40 mg Oral Daily  . sodium chloride flush  3 mL Intravenous Q12H  . thiamine  100 mg Oral Daily   Continuous Infusions:     LOS: 2 days    Time spent: >30 MINS    [facility]  [facility] Pager [phone]. If 7PM-7AM, please contact night-coverage at [email or web address], password [email or web address] 09/06/2015, 10:11 AM  LOS: 2 days

## 2015-09-06 NOTE — Progress Notes (Signed)
Responded to spiritual care consult to provide support and prayer to patient.  Jose [name] said that he was preparing for a  biopsy later today due to newly found cancer on liver and other medical issues. Patient spoke of his renewed relationship with God and his faith. Jose [name] said he would welcome any follow up visit when if he is not discharged today.  Chaplain avaiable as needed.   09/06/15 1000  Clinical Encounter Type  Visited With Patient;Health care provider  Visit Type Initial;Spiritual support  Referral From Nurse  Spiritual Encounters  Spiritual Needs Prayer;Emotional  Stress Factors  Patient Stress Factors Health changes;Major life changes  Jaclynn Major, [place]

## 2015-09-06 NOTE — Progress Notes (Signed)
Thank you for consulting the Palliative Medicine Team at [facility] to meet your patient's and family's needs.   The reason that you asked [place] to see your patient is  For Establishing GOC  Unable to scheduled your patient for a meeting at this time due to biopsy today and discharge tomorrow and coordination with patient's sister (main support) person.  Recommend Palliative Services at SNF  Discussed with Dr Gwendolyn Fill NP Palliative Medicine Team Team Phone # [phone] Pager [phone]

## 2015-09-06 NOTE — Progress Notes (Signed)
  Echocardiogram 2D Echocardiogram has been performed.  Jennette Dubin 09/06/2015, 4:40 PM

## 2015-09-07 LAB — COMPREHENSIVE METABOLIC PANEL
ALT: 56 U/L (ref 17–63)
AST: 103 U/L — ABNORMAL HIGH (ref 15–41)
Albumin: 1.9 g/dL — ABNORMAL LOW (ref 3.5–5.0)
Alkaline Phosphatase: 142 U/L — ABNORMAL HIGH (ref 38–126)
Anion gap: 8 (ref 5–15)
BUN: 13 mg/dL (ref 6–20)
CHLORIDE: 102 mmol/L (ref 101–111)
CO2: 20 mmol/L — AB (ref 22–32)
Calcium: 8 mg/dL — ABNORMAL LOW (ref 8.9–10.3)
Creatinine, Ser: 0.9 mg/dL (ref 0.61–1.24)
Glucose, Bld: 186 mg/dL — ABNORMAL HIGH (ref 65–99)
POTASSIUM: 3.6 mmol/L (ref 3.5–5.1)
SODIUM: 130 mmol/L — AB (ref 135–145)
Total Bilirubin: 2.1 mg/dL — ABNORMAL HIGH (ref 0.3–1.2)
Total Protein: 5.9 g/dL — ABNORMAL LOW (ref 6.5–8.1)

## 2015-09-07 LAB — BASIC METABOLIC PANEL
BUN: 15 mg/dL (ref 4–21)
Creatinine: 1 mg/dL (ref 0.6–1.3)
GLUCOSE: 119 mg/dL
Sodium: 130 mmol/L — AB (ref 137–147)

## 2015-09-07 LAB — PREPARE PLATELET PHERESIS: UNIT DIVISION: 0

## 2015-09-07 LAB — CBC
HCT: 28.3 % — ABNORMAL LOW (ref 39.0–52.0)
Hemoglobin: 10 g/dL — ABNORMAL LOW (ref 13.0–17.0)
MCH: 36.1 pg — AB (ref 26.0–34.0)
MCHC: 35.3 g/dL (ref 30.0–36.0)
MCV: 102.2 fL — AB (ref 78.0–100.0)
PLATELETS: 49 10*3/uL — AB (ref 150–400)
RBC: 2.77 MIL/uL — ABNORMAL LOW (ref 4.22–5.81)
RDW: 13.2 % (ref 11.5–15.5)
WBC: 4.2 10*3/uL (ref 4.0–10.5)

## 2015-09-07 LAB — CBC AND DIFFERENTIAL: WBC: 3.2 10*3/mL

## 2015-09-07 LAB — HEPATIC FUNCTION PANEL: BILIRUBIN, TOTAL: 1.8 mg/dL

## 2015-09-07 NOTE — Progress Notes (Signed)
Pt discharged to [facility] with sister. Paperwork faxed to facility and report given.

## 2015-09-07 NOTE — Progress Notes (Signed)
Patient will DC to: [facility] Anticipated DC date: 09/07/15 Family notified: Sister Transport by: Sister in car   Per MD patient ready for DC to [facility]. RN, patient, patient's family, and facility notified of DC. RN given number for report.   CSW signing off.  Cedric Fishman, [place] Social Worker [phone]

## 2015-09-07 NOTE — Clinical Social Work Placement (Signed)
   CLINICAL SOCIAL WORK PLACEMENT  NOTE  Date:  09/07/2015  Patient Details  Name: Jose [name] MRN: [id number] Date of Birth: October 12, 1955  Clinical Social Work is seeking post-discharge placement for this patient at the [place] level of care (*CSW will initial, date and re-position this form in  chart as items are completed):  Yes   Patient/family provided with [place] Work Department's list of facilities offering this level of care within the geographic area requested by the patient (or if unable, by the patient's family).  Yes   Patient/family informed of their freedom to choose among providers that offer the needed level of care, that participate in Medicare, Medicaid or managed care program needed by the patient, have an available bed and are willing to accept the patient.  Yes   Patient/family informed of [place]'s ownership interest in [facility] and [facility], as well as of the fact that they are under no obligation to receive care at these facilities.  PASRR submitted to EDS on 09/04/15     PASRR number received on 09/04/15     Existing PASRR number confirmed on       FL2 transmitted to all facilities in geographic area requested by pt/family on 09/04/15     FL2 transmitted to all facilities within larger geographic area on       Patient informed that his/her managed care company has contracts with or will negotiate with certain facilities, including the following:        Yes   Patient/family informed of bed offers received.  Patient chooses bed at [facility]     Physician recommends and patient chooses bed at      Patient to be transferred to [facility] on 09/07/15.  Patient to be transferred to facility by Sister will take by car     Patient family notified on 09/07/15 of transfer.  Name of family member notified:  Sister [name]      PHYSICIAN Please sign FL2     Additional Comment:    _______________________________________________ Benard Halsted, [place] 09/07/2015, 12:49 PM

## 2015-09-07 NOTE — Discharge Summary (Signed)
Physician Discharge Summary  Jose [name] MRN: [id number] DOB/AGE: 12/05/1955 60 y.o.  PCP: Carmie Kanner, NP   Admit date: 09/03/2015 Discharge date: 09/07/2015  Discharge Diagnoses:     Principal Problem:   AKI (acute kidney injury) [facility]) Active Problems:   Thrombocytopenia, unspecified ([place])   Hepatic encephalopathy ([place])   Weakness   Cirrhosis of liver with ascites (H[facility])   Elevated LFTs UTI   Follow-up recommendations Follow-up with PCP in 3-5 days , including all  additional recommended appointments as below Follow-up CBC, CMP, ammonia, magnesium in 3-5 days Patient needs MRI hepatic protocol to further evaluate a 3 cm mass in the right lobe of the liver, to be arranged for by PCP  Status post biopsy of this mass and results are pending at the time of discharge Patient previously seen at [facility] in [place] outpatient GI follow-up for cirrhosis and right liver lobe mass   Recommend Palliative Services at SNF    Current Discharge Medication List    START taking these medications   Details  cephALEXin (KEFLEX) 500 MG capsule Take 1 capsule (500 mg total) by mouth 3 (three) times daily. Qty: 15 capsule, Refills: 0    magnesium oxide (MAG-OX) 400 (241.3 Mg) MG tablet Take 1 tablet (400 mg total) by mouth daily. Qty: 30 tablet, Refills: 0      CONTINUE these medications which have CHANGED   Details  furosemide (LASIX) 40 MG tablet Take 1 tablet (40 mg total) by mouth 2 (two) times daily. Qty: 90 tablet, Refills: 3   Associated Diagnoses: Essential hypertension    lactulose (CHRONULAC) 10 GM/15ML solution Take 45 mLs (30 g total) by mouth 3 (three) times daily. Qty: 240 mL, Refills: 0    pantoprazole (PROTONIX) 40 MG tablet Take 1 tablet (40 mg total) by mouth daily. Qty: 30 tablet, Refills: 1    spironolactone (ALDACTONE) 100 MG tablet Take 0.5 tablets (50 mg total) by mouth daily. Qty: 30 tablet, Refills: 3    thiamine 100 MG  tablet Take 1 tablet (100 mg total) by mouth daily.      CONTINUE these medications which have NOT CHANGED   Details  folic acid (FOLVITE) 1 MG tablet Take 1 tablet (1 mg total) by mouth daily.    KLOR-CON M20 20 MEQ tablet Take 20 mEq by mouth 2 (two) times daily. Refills: 0    Multiple Vitamin (MULTIVITAMIN) tablet Take 1 tablet by mouth daily.    omeprazole (PRILOSEC) 20 MG capsule Take 20 mg by mouth daily.      STOP taking these medications     triamterene-hydrochlorothiazide (MAXZIDE-25) 37.5-25 MG tablet          Discharge Condition: Overall prognosis guarded   Discharge Instructions Get Medicines reviewed and adjusted: Please take all your medications with you for your next visit with your Primary MD  Please request your Primary MD to go over all hospital tests and procedure/radiological results at the follow up, please ask your Primary MD to get all Hospital records sent to his/her office.  If you experience worsening of your admission symptoms, develop shortness of breath, life threatening emergency, suicidal or homicidal thoughts you must seek medical attention immediately by calling 911 or calling your MD immediately if symptoms less severe.  You must read complete instructions/literature along with all the possible adverse reactions/side effects for all the Medicines you take and that have been prescribed to you. Take any new Medicines after you have completely understood and accpet  all the possible adverse reactions/side effects.   Do not drive when taking Pain medications.   Do not take more than prescribed Pain, Sleep and Anxiety Medications  Special Instructions: If you have smoked or chewed Tobacco in the last 2 yrs please stop smoking, stop any regular Alcohol and or any Recreational drug use.  Wear Seat belts while driving.  Please note  You were cared for by a hospitalist during your hospital stay. Once you are discharged, your primary care  physician will handle any further medical issues. Please note that NO REFILLS for any discharge medications will be authorized once you are discharged, as it is imperative that you return to your primary care physician (or establish a relationship with a primary care physician if you do not have one) for your aftercare needs so that they can reassess your need for medications and monitor your lab values.  Discharge Instructions    Diet - low sodium heart healthy    Complete by:  As directed      Diet - low sodium heart healthy    Complete by:  As directed      Diet - low sodium heart healthy    Complete by:  As directed      Increase activity slowly    Complete by:  As directed      Increase activity slowly    Complete by:  As directed      Increase activity slowly    Complete by:  As directed             No Known Allergies    Disposition: 03-Skilled Nursing Facility   Consults:  None     Significant Diagnostic Studies:  Dg Knee 1-2 Views Left  09/04/2015  CLINICAL DATA:  Left knee pain for several months without known injury. EXAM: LEFT KNEE - 1-2 VIEW COMPARISON:  July 24, 2015. FINDINGS: There is no evidence of fracture, dislocation, or joint effusion. There is noted increased sclerosis involving the articular surface of the medial femoral condyle. Joint spaces appear intact. Vascular calcifications are noted. IMPRESSION: No acute fracture or dislocation is noted. Focal increased sclerosis is seen involving the articular surface of the medial femoral condyle which may represent early degenerative change. Electronically Signed   By: Marijo Conception, M.D.   On: 09/04/2015 09:09   Ct Head Wo Contrast  09/04/2015  CLINICAL DATA:  Weakness.  Nausea and vomiting. EXAM: CT HEAD WITHOUT CONTRAST TECHNIQUE: Contiguous axial images were obtained from the base of the skull through the vertex without intravenous contrast. COMPARISON:  07/23/2015 FINDINGS: There is no intracranial  hemorrhage, mass or evidence of acute infarction. There is mild white matter hypodensity consistent with chronic microvascular ischemic change. There is no significant extra-axial fluid collection. No acute intracranial findings are evident. The calvarium and skullbase are intact. Visible paranasal sinuses and orbits are unremarkable. IMPRESSION: No acute intracranial findings. There are chronic appearing white matter hypodensities which likely represent small vessel ischemic disease. Electronically Signed   By: Andreas Newport M.D.   On: 09/04/2015 05:27   US Abdomen Complete  09/04/2015  CLINICAL DATA:  Elevated liver function studies, onset of abdominal pain yesterday; history of alcoholic cirrhosis, hepatitis-C EXAM: ABDOMEN ULTRASOUND COMPLETE COMPARISON:  Abdominal ultrasound of April 26, 2013 FINDINGS: Gallbladder: The gallbladder is adequately distended. Echogenic sludge and mobile stones are present. The largest stone measures 2.2 cm in diameter. There is mild gallbladder wall thickening at 3.6 mm. There is no pericholecystic  fluid or positive sonographic Murphy's sign. Common bile duct: Diameter: 5.2 mm Liver: There is heterogeneously increased hepatic echotexture. The surface contour of the liver is irregular. There is a solid-appearing hypoechoic mass in the right hepatic lobe measuring 3 x 2.4 x 2.6 cm. IVC: No abnormality visualized. Pancreas: No abnormality visualized Spleen: Size and appearance within normal limits. There is an accessory spleen measuring 1.8 cm in greatest dimension. Right Kidney: Length: 11.7 cm. The renal cortical echotexture remains lower than that of the adjacent liver. There is mild hydronephrosis. Left Kidney: Length: 12.3 cm. Echogenicity within normal limits. No mass or hydronephrosis visualized. Abdominal aorta: Bowel gas limits evaluation of the abdominal aorta. Other findings: No ascites is observed. IMPRESSION: 1. Heterogeneously increased hepatic echotexture with  irregularity of the hepatic contour consistent with known cirrhosis. Solid-appearing right lobe mass measuring 3 cm in greatest dimension. Further evaluation with hepatic protocol MRI is recommended. 2. Gallstones without sonographic evidence of acute cholecystitis. There is mild gallbladder wall thickening which has been observed in the past. 3. Chronic mild right-sided hydronephrosis. Electronically Signed   By: David  [place] M.D.   On: 09/04/2015 10:04   Mr 3d Recon At Scanner  09/05/2015  CLINICAL DATA:  Cirrhosis with hepatic mass seen on recent ultrasound. EXAM: MRI ABDOMEN WITHOUT AND WITH CONTRAST (INCLUDING MRCP) TECHNIQUE: Multiplanar multisequence MR imaging of the abdomen was performed both before and after the administration of intravenous contrast. Heavily T2-weighted images of the biliary and pancreatic ducts were obtained, and three-dimensional MRCP images were rendered by post processing. CONTRAST:  8 cc Eovist COMPARISON:  Ultrasound 09/04/2015 FINDINGS: Examination is limited by respiratory motion. Also, I do not have early arterial phase imaging. There is a 2.8 cm segment 7 hepatic lesion which has slight increased T2 signal intensity. This is bright on the T1 weighted gradient echo sequences surrounded by a background of diffuse fatty infiltration. The contrast enhanced images wall appear the same. Contrast enhancement is difficult to assess because the lesion is bright on T1. Extremely low T2 signal intensity in the liver and pancreas suggest iron deposition. The spleen appears normal. The gallbladder demonstrates gallstones, wall thickening and pericholecystic fluid. The common bile duct is normal in caliber and has a normal course. No common bile duct stones. The adrenal glands and kidneys are unremarkable. No mesenteric or retroperitoneal mass or adenopathy. Extensive portal venous collaterals due to portal hypertension. Esophageal varices are also noted. The portal vein is patent. Small  amount of perihepatic ascites. No significant bony findings. IMPRESSION: 1. Limited examination due to timing of the contrast administration and lack of precontrast LAVA sequence. There is a T1 bright 2.8 cm lesion in segment 7 of the liver. Contrast enhancement is difficult to assess but this is considered a hepatoma. Recommend ultrasound-guided biopsy. 2. Advanced cirrhotic changes with portal venous hypertension, portal venous collaterals and esophageal varices. 3. Suspect iron deposition in liver and pancreas. 4. Gallstones, gallbladder wall thickening and pericholecystic fluid. Electronically Signed   By: Rudie Meyer M.D.   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 Please perform ultrasound-guided biopsy for tissue diagnostic purposes. EXAM: ULTRASOUND GUIDED LIVER LESION BIOPSY COMPARISON:  Abdominal MRI - 09/05/2015 MEDICATIONS: None ANESTHESIA/SEDATION: Fentanyl 50 mcg IV; Versed 1 mg IV Total Moderate Sedation time: 12 minutes. The patient's level of consciousness and vital signs were monitored continuously by  radiology nursing throughout the procedure under my direct supervision. COMPLICATIONS: None immediate. PROCEDURE: Informed written consent was obtained from the patient after a discussion of the risks, benefits and alternatives to treatment. The patient understands and consents the procedure. A timeout was performed prior to the initiation of the procedure. Ultrasound scanning was performed of the right upper abdominal quadrant demonstrates an approximately 2.5 x 1.9 cm lesion within medial aspect of the posterior segment of the right lobe of the liver (representative image 9). The procedure was planned. The right upper abdominal quadrant was prepped and draped in the usual sterile fashion. The overlying soft tissues were  anesthetized with 1% lidocaine with epinephrine. A 17 gauge, 6.8 cm co-axial needle was advanced into a peripheral aspect of the lesion. This was followed by 3 core biopsies with an 18 gauge core device under direct ultrasound guidance. The coaxial needle tract was embolized with a small amount of Gel-Foam slurry and superficial hemostasis was obtained with manual compression. Post procedural scanning was negative for definitive area of hemorrhage or additional complication. A dressing was placed. The patient tolerated the procedure well without immediate post procedural complication. IMPRESSION: Technically successful ultrasound guided core needle biopsy of indeterminate approximately 2.7 cm lesion within the medial aspect of the posterior segment of the right lobe. Electronically Signed   By: Sandi Mariscal M.D.   On: 09/06/2015 13:32   Dg Chest Port 1 View  09/03/2015  CLINICAL DATA:  Generalized weakness and cough for 3 days EXAM: PORTABLE CHEST 1 VIEW COMPARISON:  07/23/2015 FINDINGS: A single AP portable view of the chest demonstrates no focal airspace consolidation or alveolar edema. The lungs are grossly clear. There is no large effusion or pneumothorax. Cardiac and mediastinal contours appear unremarkable. IMPRESSION: No active disease. Electronically Signed   By: Andreas Newport M.D.   On: 09/03/2015 22:05   Mr Abd W/wo Cm/mrcp  09/05/2015  CLINICAL DATA:  Cirrhosis with hepatic mass seen on recent ultrasound. EXAM: MRI ABDOMEN WITHOUT AND WITH CONTRAST (INCLUDING MRCP) TECHNIQUE: Multiplanar multisequence MR imaging of the abdomen was performed both before and after the administration of intravenous contrast. Heavily T2-weighted images of the biliary and pancreatic ducts were obtained, and three-dimensional MRCP images were rendered by post processing. CONTRAST:  8 cc Eovist COMPARISON:  Ultrasound 09/04/2015 FINDINGS: Examination is limited by respiratory motion. Also, I do not have early arterial  phase imaging. There is a 2.8 cm segment 7 hepatic lesion which has slight increased T2 signal intensity. This is bright on the T1 weighted gradient echo sequences surrounded by a background of diffuse fatty infiltration. The contrast enhanced images wall appear the same. Contrast enhancement is difficult to assess because the lesion is bright on T1. Extremely low T2 signal intensity in the liver and pancreas suggest iron deposition. The spleen appears normal. The gallbladder demonstrates gallstones, wall thickening and pericholecystic fluid. The common bile duct is normal in caliber and has a normal course. No common bile duct stones. The adrenal glands and kidneys are unremarkable. No mesenteric or retroperitoneal mass or adenopathy. Extensive portal venous collaterals due to portal hypertension. Esophageal varices are also noted. The portal vein is patent. Small amount of perihepatic ascites. No significant bony findings. IMPRESSION: 1. Limited examination due to timing of the contrast administration and lack of precontrast LAVA sequence. There is a T1 bright 2.8 cm lesion in segment 7 of the liver. Contrast enhancement is difficult to assess but this is considered a hepatoma. Recommend ultrasound-guided biopsy. 2. Advanced  cirrhotic changes with portal venous hypertension, portal venous collaterals and esophageal varices. 3. Suspect iron deposition in liver and pancreas. 4. Gallstones, gallbladder wall thickening and pericholecystic fluid. Electronically Signed   By: Marijo Sanes M.D.   On: 09/05/2015 13:44       Filed Weights   09/05/15 0601 09/05/15 0612 09/07/15 0546  Weight: 78.8 kg (173 lb 11.6 oz) 80.3 kg (177 lb 0.5 oz) 88 kg (194 lb 0.1 oz)     Microbiology: No results found for this or any previous visit (from the past 240 hour(s)).     Blood Culture    Component Value Date/Time   SDES BLOOD LEFT ANTECUBITAL 07/23/2015 1940   SPECREQUEST BOTTLES DRAWN AEROBIC AND ANAEROBIC 5CC  07/23/2015 1940   CULT NO GROWTH 5 DAYS 07/23/2015 1940   REPTSTATUS 07/28/2015 FINAL 07/23/2015 1940      Labs: Results for orders placed or performed during the hospital encounter of 09/03/15 (from the past 48 hour(s))  Ammonia     Status: Abnormal   Collection Time: 09/05/15  9:21 AM  Result Value Ref Range   Ammonia 47 (H) 9 - 35 umol/L  AFP tumor marker     Status: Abnormal   Collection Time: 09/05/15  2:47 PM  Result Value Ref Range   AFP-Tumor Marker 34.4 (H) 0.0 - 8.3 ng/mL    Comment: (NOTE) [employer] ECLIA methodology Performed At: [facility] [place], [place] [id number] Lindon Romp MD YH:[id number]   Type and screen [place]     Status: None   Collection Time: 09/05/15  3:44 PM  Result Value Ref Range   ABO/RH(D) O POS    Antibody Screen NEG    Sample Expiration 09/08/2015   ABO/Rh     Status: None   Collection Time: 09/05/15  3:44 PM  Result Value Ref Range   ABO/RH(D) O POS   CBC     Status: Abnormal   Collection Time: 09/06/15  5:37 AM  Result Value Ref Range   WBC 3.2 (L) 4.0 - 10.5 K/uL   RBC 2.86 (L) 4.22 - 5.81 MIL/uL   Hemoglobin 10.4 (L) 13.0 - 17.0 g/dL   HCT 29.5 (L) 39.0 - 52.0 %   MCV 103.1 (H) 78.0 - 100.0 fL   MCH 36.4 (H) 26.0 - 34.0 pg   MCHC 35.3 30.0 - 36.0 g/dL   RDW 13.3 11.5 - 15.5 %   Platelets 36 (L) 150 - 400 K/uL    Comment: REPEATED TO VERIFY SPECIMEN CHECKED FOR CLOTS CONSISTENT WITH PREVIOUS RESULT   Comprehensive metabolic panel     Status: Abnormal   Collection Time: 09/06/15  5:37 AM  Result Value Ref Range   Sodium 130 (L) 135 - 145 mmol/L   Potassium 4.1 3.5 - 5.1 mmol/L   Chloride 102 101 - 111 mmol/L   CO2 19 (L) 22 - 32 mmol/L   Glucose, Bld 119 (H) 65 - 99 mg/dL   BUN 15 6 - 20 mg/dL   Creatinine, Ser 1.02 0.61 - 1.24 mg/dL   Calcium 8.2 (L) 8.9 - 10.3 mg/dL   Total Protein 6.3 (L) 6.5 - 8.1 g/dL   Albumin 2.0 (L) 3.5 - 5.0 g/dL   AST 112 (H) 15 - 41 U/L   ALT  61 17 - 63 U/L   Alkaline Phosphatase 143 (H) 38 - 126 U/L   Total Bilirubin 1.8 (H) 0.3 - 1.2 mg/dL   GFR calc non Af  Amer >60 >60 mL/min   GFR calc Af Amer >60 >60 mL/min    Comment: (NOTE) The eGFR has been calculated using the CKD EPI equation. This calculation has not been validated in all clinical situations. eGFR's persistently <60 mL/min signify possible Chronic Kidney Disease.    Anion gap 9 5 - 15  Prepare Pheresed Platelets     Status: None (Preliminary result)   Collection Time: 09/06/15  7:00 AM  Result Value Ref Range   Unit Number [id number]    Blood Component Type PLTPHER LR1    Unit division 00    Status of Unit ISSUED    Transfusion Status OK TO TRANSFUSE   CBC     Status: Abnormal   Collection Time: 09/06/15 10:23 AM  Result Value Ref Range   WBC 3.6 (L) 4.0 - 10.5 K/uL   RBC 3.16 (L) 4.22 - 5.81 MIL/uL   Hemoglobin 11.4 (L) 13.0 - 17.0 g/dL   HCT 32.5 (L) 39.0 - 52.0 %   MCV 102.8 (H) 78.0 - 100.0 fL   MCH 36.1 (H) 26.0 - 34.0 pg   MCHC 35.1 30.0 - 36.0 g/dL   RDW 13.3 11.5 - 15.5 %   Platelets 41 (L) 150 - 400 K/uL    Comment: CONSISTENT WITH PREVIOUS RESULT REPEATED TO VERIFY   Vitamin B12     Status: Abnormal   Collection Time: 09/06/15 10:23 AM  Result Value Ref Range   Vitamin B-12 1377 (H) 180 - 914 pg/mL    Comment: (NOTE) This assay is not validated for testing neonatal or myeloproliferative syndrome specimens for Vitamin B12 levels.   Folate     Status: None   Collection Time: 09/06/15 10:23 AM  Result Value Ref Range   Folate 20.8 >5.9 ng/mL  Iron and TIBC     Status: Abnormal   Collection Time: 09/06/15 10:23 AM  Result Value Ref Range   Iron 157 45 - 182 ug/dL   TIBC 188 (L) 250 - 450 ug/dL   Saturation Ratios 84 (H) 17.9 - 39.5 %   UIBC 31 ug/dL  Ferritin     Status: Abnormal   Collection Time: 09/06/15 10:23 AM  Result Value Ref Range   Ferritin 1295 (H) 24 - 336 ng/mL  Reticulocytes     Status: Abnormal   Collection  Time: 09/06/15 10:23 AM  Result Value Ref Range   Retic Ct Pct 2.4 0.4 - 3.1 %   RBC. 3.16 (L) 4.22 - 5.81 MIL/uL   Retic Count, Manual 75.8 19.0 - 186.0 K/uL  CBC     Status: Abnormal   Collection Time: 09/07/15  6:48 AM  Result Value Ref Range   WBC 4.2 4.0 - 10.5 K/uL   RBC 2.77 (L) 4.22 - 5.81 MIL/uL   Hemoglobin 10.0 (L) 13.0 - 17.0 g/dL   HCT 28.3 (L) 39.0 - 52.0 %   MCV 102.2 (H) 78.0 - 100.0 fL   MCH 36.1 (H) 26.0 - 34.0 pg   MCHC 35.3 30.0 - 36.0 g/dL   RDW 13.2 11.5 - 15.5 %   Platelets 49 (L) 150 - 400 K/uL    Comment: CONSISTENT WITH PREVIOUS RESULT  Comprehensive metabolic panel     Status: Abnormal   Collection Time: 09/07/15  6:48 AM  Result Value Ref Range   Sodium 130 (L) 135 - 145 mmol/L   Potassium 3.6 3.5 - 5.1 mmol/L   Chloride 102 101 - 111 mmol/L   CO2 20 (L) 22 -  32 mmol/L   Glucose, Bld 186 (H) 65 - 99 mg/dL   BUN 13 6 - 20 mg/dL   Creatinine, Ser 0.90 0.61 - 1.24 mg/dL   Calcium 8.0 (L) 8.9 - 10.3 mg/dL   Total Protein 5.9 (L) 6.5 - 8.1 g/dL   Albumin 1.9 (L) 3.5 - 5.0 g/dL   AST 103 (H) 15 - 41 U/L   ALT 56 17 - 63 U/L   Alkaline Phosphatase 142 (H) 38 - 126 U/L   Total Bilirubin 2.1 (H) 0.3 - 1.2 mg/dL   GFR calc non Af Amer >60 >60 mL/min   GFR calc Af Amer >60 >60 mL/min    Comment: (NOTE) The eGFR has been calculated using the CKD EPI equation. This calculation has not been validated in all clinical situations. eGFR's persistently <60 mL/min signify possible Chronic Kidney Disease.    Anion gap 8 5 - 15     Lipid Panel     Component Value Date/Time   CHOL 95 06/06/2013 1211   TRIG 94 06/06/2013 1211   HDL 32* 06/06/2013 1211   CHOLHDL 3.0 06/06/2013 1211   VLDL 19 06/06/2013 1211   LDLCALC 44 06/06/2013 1211     No results found for: HGBA1C   Lab Results  Component Value Date   LDLCALC 44 06/06/2013   CREATININE 0.90 09/07/2015    60 y.o. male with medical history significant of cirrhosis of liver, alcohol abuse and  has been not drinking alcohol for last 2 months, hepatitis C, chronic thrombocytopenia presents to the ER because of weakness. Patient states over the last 2-3 days patient has been feeling weak and lethargic. He has been sleeping more than usual. Creatinine has increased from baseline and is around 1.5 now. LFTs are also elevated from baseline. Patient states he has not been drinking alcohol for last 2 months. Still has some complaints on his left knee which has been chronic at this time. Patient admitted for hepatic encephalopathy. Ammonia 142  Assessment and plan   #1. Acute renal failure - probably from poor oral intake and diuretics. Aldactone and Lasix held initially, Maxzide also held initially. Patient hydrated gently with IV fluids. Patient did receive fluid bolus of 500 mL in the ER. Creatinine 0.90  prior to discharge. Okay to resume diuretics in the next couple of days, continue to hold Maxide   #2. Hepatic encephalopathy - patient was lethargic. Ammonia levels 142, repeat ammonia level 47 . Increased lactulose 30 mL to 3 times a day from 2 times a day. Patient also may require Xifaxan. Follow ammonia levels in the outpatient setting. CT head negative  #3. Transaminitis, elevated lipase -ultrasound shows cirrhosis, solid-appearing right lobe mass measuring 3 cm, MRI recommended. Gallstones without acute cholecystitis, chronic mild right-sided hydronephrosis. Started empirically on ceftriaxone. Patient likely has chronically elevated lipase, lipase was 60 this admission. Total bilirubin has improved and was found to be 2.1 on the day of discharge. Patient was also found to have mildly elevated alkaline phosphatase 142 With mildly elevated bilirubin and alkaline phosphatase, concern for obstructive jaundice/cholangiocarcinoma, please order MRCP /MRI showed 2.8 cm mass, elevated alpha fetoprotein 34.4. As per general surgery hepatologist Dr. Barry Dienes , patient is not a transplant or a  surgical candidate, and further decision regarding surgery needs to be made in the outpatient setting. Interventional radiology consulted for ultrasound-guided biopsy done  after completion of platelet transfusion.  Results of the biopsy pending at this time. Patient would benefit from obtaining appointment with  the hepatologist for further management    #4. Cirrhosis of the liver secondary to hepatitis C and alcohol abuse - Patient states he has not had any alcohol for last 2 months. Patient will need follow-up with infectious disease clinic for treatment for hepatitis C. resuming diuretics at a lower dose . No ascites,  #5. Generalized weakness secondary to acute renal failure and hepatic encephalopathy. Slightly abnormal but flat troponins elevated lactic acid levels likely due to cirrhosis improved with hydration. Physical therapy recommends SNF.  #6. Chronic left knee pain - shows early degenerative changes.  #7. Chronic thrombocytopenia secondary to hepatitis C and cirrhosis of the liver - follow CBC. Follow-up with GI in the outpatient setting. Administering one unit of platelets prior to this biopsy  #8. Possible UTI - patient is on ceftriaxone. urine cultures. No growth so far, Now switched to Keflex 5 days  DVT prophylaxsis SCDs  Code Status: Full code Discharge Exam:   Blood pressure 123/63, pulse 71, temperature 99.4 F (37.4 C), temperature source Oral, resp. rate 18, weight 88 kg (194 lb 0.1 oz), SpO2 98 %. General exam: Appears calm and comfortable  Respiratory system: Clear to auscultation. Respiratory effort normal. Cardiovascular system: S1 & S2 heard, RRR. No JVD, murmurs, rubs, gallops or clicks. No pedal edema. Gastrointestinal system: Abdomen is nondistended, soft and nontender. No organomegaly or masses felt. Normal bowel sounds heard. Central nervous system: Alert and oriented. No focal neurological deficits. Extremities: Symmetric 5 x 5 power. Skin: No  rashes, lesions or ulcers Psychiatry: Judgement and insight appear normal. Mood & affect appropriate     Follow-up Information    Follow up with [facility] H, NP. Schedule an appointment as soon as possible for a visit in 3 days.   Contact information:   [address] [place] [address] [phone]       Signed: Reyne Dumas 09/07/2015, 8:56 AM        Time spent >45 mins

## 2015-09-11 ENCOUNTER — Encounter: Payer: Self-pay | Admitting: Internal Medicine

## 2015-09-11 ENCOUNTER — Non-Acute Institutional Stay (SKILLED_NURSING_FACILITY): Payer: Medicaid Other | Admitting: Internal Medicine

## 2015-09-11 DIAGNOSIS — K7031 Alcoholic cirrhosis of liver with ascites: Secondary | ICD-10-CM | POA: Diagnosis not present

## 2015-09-11 DIAGNOSIS — E43 Unspecified severe protein-calorie malnutrition: Secondary | ICD-10-CM

## 2015-09-11 DIAGNOSIS — K219 Gastro-esophageal reflux disease without esophagitis: Secondary | ICD-10-CM

## 2015-09-11 DIAGNOSIS — M1712 Unilateral primary osteoarthritis, left knee: Secondary | ICD-10-CM

## 2015-09-11 DIAGNOSIS — K4090 Unilateral inguinal hernia, without obstruction or gangrene, not specified as recurrent: Secondary | ICD-10-CM

## 2015-09-11 DIAGNOSIS — F1020 Alcohol dependence, uncomplicated: Secondary | ICD-10-CM

## 2015-09-11 DIAGNOSIS — M129 Arthropathy, unspecified: Secondary | ICD-10-CM | POA: Diagnosis not present

## 2015-09-11 DIAGNOSIS — K7682 Hepatic encephalopathy: Secondary | ICD-10-CM

## 2015-09-11 DIAGNOSIS — D638 Anemia in other chronic diseases classified elsewhere: Secondary | ICD-10-CM | POA: Diagnosis not present

## 2015-09-11 DIAGNOSIS — I10 Essential (primary) hypertension: Secondary | ICD-10-CM | POA: Diagnosis not present

## 2015-09-11 DIAGNOSIS — K7290 Hepatic failure, unspecified without coma: Secondary | ICD-10-CM | POA: Diagnosis not present

## 2015-09-11 NOTE — Progress Notes (Signed)
Patient ID: Jose [name], male   DOB: 04/24/1956, 60 y.o.   MRN: [id number]    HISTORY AND PHYSICAL   DATE: 09/11/15  Location:  [facility] [place] Room Number: 143-B Place of Service: SNF [phone])   Extended Emergency Contact Information Primary Emergency Contact: [place], [place] [address] [place] Phone: [phone] Mobile Phone: [phone] Relation: Sister Secondary Emergency Contact: Sela Hilding, [place] [address] [place] of [place] Phone: [phone] Relation: Other  Advanced Directive information  FULL CODE  Chief Complaint  Patient presents with  . New Admit To SNF    New Admission    HPI:  60 yo male seen today as a new admission into SNF following hospital stay for hepatic encephalopathy, thrombocytopenia, right lobe hepatic mass, UTI, elevated liver enzymes, AKI. This is his 3rd hospitalization in 2017. Tbili elevated and AFP up (34.4) and he underwent MRI/MRCP that revealed 2.8 cm mass.  Liver mass US guided bx by IR prior to d/c and results pending at d/c. Determined not to be transplant candidate. Presumed UTI tx with IV rocephin --> po keflex at d/c. Cr 1.59-->0.9 at d/c; Tbili 2.1; albumin 1.9; Plts 49K; H/H 10/28.3  Today he notes c/a right groin hernia x 2 yrs that has recently become painful. He has not seen urology. No nursing issues. Appetite is excellent. No falls.  Hep C - currently not being tx. He was previously followed by GI in [facility]. Hep C viral load 10,100 IU/ml with RNA log 4.004  Etoh cirrhosis/hepatic encephalopathy - on lactulose, aldactone. He is followed by GI  Etoh abuse - takes thiamine and folate. B12/folate levels adequate  GERD - stable on protonix  Edema/HTN - stable on lasix and aldactone  Protein calorie malnutrition - 2/2 Etoh cirrhosis. On vitamins. Albumin 1.9  Chronic left knee pain - xrays showed arthritic changes in  hospital  Anemia - related to cirrhosis. Hgb 10 at d/c  Past Medical History  Diagnosis Date  . Hypertension   . Hepatitis C   . Alcohol abuse   . Ascites   . Cirrhosis ([place])   . Shortness of breath     with exertion  . Depression   . Alcoholism ([place]) 07/12/2015    Past Surgical History  Procedure Laterality Date  . Colonoscopy  march 2015    Dr. Renee Harder: nodular mucosa at appendiceal orifice, tubular adenoma, extremely poor prep  . Circumcision      Patient Care Team: Marliss Coots, NP as PCP - General Langley Gauss, MD as Consulting Physician (Gastroenterology) [name], MD as Attending Physician (Internal Medicine)  Social History   Social History  . Marital Status: Single    Spouse Name: N/A  . Number of Children: N/A  . Years of Education: N/A   Occupational History  . Not on file.   Social History Main Topics  . Smoking status: Light Tobacco Smoker -- 0.50 packs/day    Types: Cigarettes  . Smokeless tobacco: Never Used     Comment: STATES HE SMOKES 2-3 TIMES MONTHLY  . Alcohol Use: Yes     Comment: A couple beers every few days   . Drug Use: Yes    Special: Marijuana, Cocaine     Comment: last used 11/03/13  . Sexual Activity: Not Currently     Comment: MPOA sister and  Doug senior   Other Topics Concern  . Not on file   Social History Narrative     reports that he has been smoking Cigarettes.  He has been smoking about 0.50 packs per day. He has never used smokeless tobacco. He reports that he drinks alcohol. He reports that he uses illicit drugs (Marijuana and Cocaine).  Family History  Problem Relation Age of Onset  . Breast cancer Mother   . Hypertension Mother   . Diabetes Father   . Hypertension Sister   . Heart disease Sister   . Hypertension Paternal Grandmother   . Colon cancer Neg Hx    No family status information on file.    Immunization History  Administered Date(s) Administered  . Influenza,inj,Quad  PF,36+ Mos 04/27/2013    No Known Allergies  Medications: Patient's Medications  New Prescriptions   No medications on file  Previous Medications   CEPHALEXIN (KEFLEX) 500 MG CAPSULE    Take 1 capsule (500 mg total) by mouth 3 (three) times daily.   FOLIC ACID (FOLVITE) 1 MG TABLET    Take 1 tablet (1 mg total) by mouth daily.   FUROSEMIDE (LASIX) 40 MG TABLET    Take 1 tablet (40 mg total) by mouth 2 (two) times daily.   KLOR-CON M20 20 MEQ TABLET    Take 20 mEq by mouth 2 (two) times daily.   LACTULOSE (CHRONULAC) 10 GM/15ML SOLUTION    Take 45 mLs (30 g total) by mouth 3 (three) times daily.   MAGNESIUM OXIDE (MAG-OX) 400 (241.3 MG) MG TABLET    Take 1 tablet (400 mg total) by mouth daily.   MULTIPLE VITAMIN (MULTIVITAMIN) TABLET    Take 1 tablet by mouth daily.   OMEPRAZOLE (PRILOSEC) 20 MG CAPSULE    Take 20 mg by mouth daily.   PANTOPRAZOLE (PROTONIX) 40 MG TABLET    Take 1 tablet (40 mg total) by mouth daily.   SPIRONOLACTONE (ALDACTONE) 100 MG TABLET    Take 0.5 tablets (50 mg total) by mouth daily.   THIAMINE 100 MG TABLET    Take 1 tablet (100 mg total) by mouth daily.  Modified Medications   No medications on file  Discontinued Medications   No medications on file    Review of Systems  Unable to perform ROS: Other  encephalopathy  Filed Vitals:   09/11/15 1129  BP: 114/65  Pulse: 76  Temp: 97.8 F (36.6 C)  TempSrc: Oral  Resp: 18  Height: '5\' 6"'$  (1.676 m)  Weight: 192 lb 12.8 oz (87.454 kg)  SpO2: 97%   Body mass index is 31.13 kg/(m^2).  Physical Exam  Constitutional: He appears well-developed.  Sitting on edge of bed in NAD, frail appearing   HENT:  Mouth/Throat: Oropharynx is clear and moist.  Eyes: Pupils are equal, round, and reactive to light. Scleral icterus is present.  Neck: Neck supple. Carotid bruit is not present. No thyromegaly present.  Cardiovascular: Normal rate, regular rhythm and intact distal pulses.  Exam reveals no gallop and no  friction rub.   Murmur (2/6 SEM ---> carotid b/l) heard. Trace LE edema b/l. No calf TTP  Pulmonary/Chest: Effort normal and breath sounds normal. He has no wheezes. He has no rales. He exhibits no tenderness.  Abdominal: Soft. Bowel sounds are normal. He exhibits distension and ascites. He exhibits no abdominal bruit, no pulsatile midline mass and no mass. There is hepatomegaly. There is no tenderness. There is no rebound and no guarding.  Genitourinary:  Large right inguinal hernia  Musculoskeletal: He exhibits edema.  Lymphadenopathy:    He has no cervical adenopathy.  Neurological: He is alert.  Skin: Skin is warm and dry. No rash noted.  Psychiatric: He has a normal mood and affect. His behavior is normal.     Labs reviewed: Admission on 09/03/2015, Discharged on 09/07/2015  Component Date Value Ref Range Status  . Lipase 09/03/2015 60* 11 - 51 U/L Final  . Sodium 09/03/2015 134* 135 - 145 mmol/L Final  . Potassium 09/03/2015 4.2  3.5 - 5.1 mmol/L Final  . Chloride 09/03/2015 103  101 - 111 mmol/L Final  . CO2 09/03/2015 19* 22 - 32 mmol/L Final  . Glucose, Bld 09/03/2015 164* 65 - 99 mg/dL Final  . BUN 49/82/6415 27* 6 - 20 mg/dL Final  . Creatinine, Ser 09/03/2015 1.59* 0.61 - 1.24 mg/dL Final  . Calcium 83/12/4074 9.2  8.9 - 10.3 mg/dL Final  . Total Protein 09/03/2015 8.4* 6.5 - 8.1 g/dL Final  . Albumin 80/88/1103 2.7* 3.5 - 5.0 g/dL Final  . AST 15/94/5859 147* 15 - 41 U/L Final  . ALT 09/03/2015 79* 17 - 63 U/L Final  . Alkaline Phosphatase 09/03/2015 182* 38 - 126 U/L Final  . Total Bilirubin 09/03/2015 3.5* 0.3 - 1.2 mg/dL Final  . GFR calc non Af Amer 09/03/2015 46* >60 mL/min Final  . GFR calc Af Amer 09/03/2015 53* >60 mL/min Final   Comment: (NOTE) The eGFR has been calculated using the CKD EPI equation. This calculation has not been validated in all clinical situations. eGFR's persistently <60 mL/min signify possible Chronic Kidney Disease.   . Anion gap  09/03/2015 12  5 - 15 Final  . WBC 09/03/2015 4.6  4.0 - 10.5 K/uL Final  . RBC 09/03/2015 4.01* 4.22 - 5.81 MIL/uL Final  . Hemoglobin 09/03/2015 14.1  13.0 - 17.0 g/dL Final  . HCT 29/24/4628 40.6  39.0 - 52.0 % Final  . MCV 09/03/2015 101.2* 78.0 - 100.0 fL Final  . MCH 09/03/2015 35.2* 26.0 - 34.0 pg Final  . MCHC 09/03/2015 34.7  30.0 - 36.0 g/dL Final  . RDW 63/81/7711 13.6  11.5 - 15.5 % Final  . Platelets 09/03/2015 65* 150 - 400 K/uL Final   CONSISTENT WITH PREVIOUS RESULT  . Color, Urine 09/03/2015 ORANGE* YELLOW Final   BIOCHEMICALS MAY BE AFFECTED BY COLOR  . APPearance 09/03/2015 CLOUDY* CLEAR Final  . Specific Gravity, Urine 09/03/2015 1.025  1.005 - 1.030 Final  . pH 09/03/2015 5.5  5.0 - 8.0 Final  . Glucose, UA 09/03/2015 NEGATIVE  NEGATIVE mg/dL Final  . Hgb urine dipstick 09/03/2015 MODERATE* NEGATIVE Final  . Bilirubin Urine 09/03/2015 SMALL* NEGATIVE Final  . Ketones, ur 09/03/2015 15* NEGATIVE mg/dL Final  . Protein, ur 65/79/0383 NEGATIVE  NEGATIVE mg/dL Final  . Nitrite 33/83/2919 POSITIVE* NEGATIVE Final  . Leukocytes, UA 09/03/2015 SMALL* NEGATIVE Final  . Prothrombin Time 09/04/2015 16.2* 11.6 - 15.2 seconds Final  . INR 09/04/2015 1.29  0.00 - 1.49 Final  . Sodium, Ur 09/03/2015 43   Final  . Creatinine, Urine 09/03/2015 271.64   Final  . Lactic Acid, Venous 09/04/2015 2.7* 0.5 - 2.0 mmol/L Final   Comment: CRITICAL RESULT CALLED TO, READ BACK BY AND VERIFIED WITH: OLLIFON,J RN 09/04/2015 0043 JORDANS   . Troponin I 09/04/2015 0.05* <0.031 ng/mL Final   Comment:        PERSISTENTLY INCREASED TROPONIN VALUES IN THE RANGE OF 0.04-0.49  ng/mL CAN BE SEEN IN:       -UNSTABLE ANGINA       -CONGESTIVE HEART FAILURE       -MYOCARDITIS       -CHEST TRAUMA       -ARRYHTHMIAS       -LATE PRESENTING MYOCARDIAL INFARCTION       -COPD   CLINICAL FOLLOW-UP RECOMMENDED.   Marland Kitchen Opiates 09/03/2015 NONE DETECTED  NONE DETECTED Final  . Cocaine 09/03/2015 NONE  DETECTED  NONE DETECTED Final  . Benzodiazepines 09/03/2015 NONE DETECTED  NONE DETECTED Final  . Amphetamines 09/03/2015 NONE DETECTED  NONE DETECTED Final  . Tetrahydrocannabinol 09/03/2015 NONE DETECTED  NONE DETECTED Final  . Barbiturates 09/03/2015 NONE DETECTED  NONE DETECTED Final   Comment:        DRUG SCREEN FOR MEDICAL PURPOSES ONLY.  IF CONFIRMATION IS NEEDED FOR ANY PURPOSE, NOTIFY LAB WITHIN 5 DAYS.        LOWEST DETECTABLE LIMITS FOR URINE DRUG SCREEN Drug Class       Cutoff (ng/mL) Amphetamine      1000 Barbiturate      200 Benzodiazepine   710 Tricyclics       626 Opiates          300 Cocaine          300 THC              50   . Squamous Epithelial / LPF 09/03/2015 6-30* NONE SEEN Final  . WBC, UA 09/03/2015 0-5  0 - 5 WBC/hpf Final  . RBC / HPF 09/03/2015 6-30  0 - 5 RBC/hpf Final  . Bacteria, UA 09/03/2015 MANY* NONE SEEN Final  . Casts 09/03/2015 HYALINE CASTS* NEGATIVE Final  . Ammonia 09/04/2015 142* 9 - 35 umol/L Final  . Sodium 09/04/2015 134* 135 - 145 mmol/L Final  . Potassium 09/04/2015 3.9  3.5 - 5.1 mmol/L Final  . Chloride 09/04/2015 102  101 - 111 mmol/L Final  . CO2 09/04/2015 19* 22 - 32 mmol/L Final  . Glucose, Bld 09/04/2015 155* 65 - 99 mg/dL Final  . BUN 09/04/2015 26* 6 - 20 mg/dL Final  . Creatinine, Ser 09/04/2015 1.40* 0.61 - 1.24 mg/dL Final  . Calcium 09/04/2015 8.5* 8.9 - 10.3 mg/dL Final  . GFR calc non Af Amer 09/04/2015 54* >60 mL/min Final  . GFR calc Af Amer 09/04/2015 >60  >60 mL/min Final   Comment: (NOTE) The eGFR has been calculated using the CKD EPI equation. This calculation has not been validated in all clinical situations. eGFR's persistently <60 mL/min signify possible Chronic Kidney Disease.   . Anion gap 09/04/2015 13  5 - 15 Final  . WBC 09/04/2015 4.8  4.0 - 10.5 K/uL Final  . RBC 09/04/2015 3.79* 4.22 - 5.81 MIL/uL Final  . Hemoglobin 09/04/2015 13.5  13.0 - 17.0 g/dL Final  . HCT 09/04/2015 38.8* 39.0 -  52.0 % Final  . MCV 09/04/2015 102.4* 78.0 - 100.0 fL Final  . MCH 09/04/2015 35.6* 26.0 - 34.0 pg Final  . MCHC 09/04/2015 34.8  30.0 - 36.0 g/dL Final  . RDW 09/04/2015 13.7  11.5 - 15.5 % Final  . Platelets 09/04/2015 44* 150 - 400 K/uL Final   CONSISTENT WITH PREVIOUS RESULT  . Total Protein 09/04/2015 7.3  6.5 - 8.1 g/dL Final  . Albumin 09/04/2015 2.4* 3.5 - 5.0 g/dL Final  . AST 09/04/2015 132* 15 - 41 U/L Final  . ALT 09/04/2015 70* 17 -  63 U/L Final  . Alkaline Phosphatase 09/04/2015 137* 38 - 126 U/L Final  . Total Bilirubin 09/04/2015 3.3* 0.3 - 1.2 mg/dL Final  . Bilirubin, Direct 09/04/2015 1.2* 0.1 - 0.5 mg/dL Final  . Indirect Bilirubin 09/04/2015 2.1* 0.3 - 0.9 mg/dL Final  . Troponin I 85/73/9374 0.05* <0.031 ng/mL Final   Comment:        PERSISTENTLY INCREASED TROPONIN VALUES IN THE RANGE OF 0.04-0.49 ng/mL CAN BE SEEN IN:       -UNSTABLE ANGINA       -CONGESTIVE HEART FAILURE       -MYOCARDITIS       -CHEST TRAUMA       -ARRYHTHMIAS       -LATE PRESENTING MYOCARDIAL INFARCTION       -COPD   CLINICAL FOLLOW-UP RECOMMENDED.   Marland Kitchen Troponin I 09/04/2015 0.06* <0.031 ng/mL Final   Comment:        PERSISTENTLY INCREASED TROPONIN VALUES IN THE RANGE OF 0.04-0.49 ng/mL CAN BE SEEN IN:       -UNSTABLE ANGINA       -CONGESTIVE HEART FAILURE       -MYOCARDITIS       -CHEST TRAUMA       -ARRYHTHMIAS       -LATE PRESENTING MYOCARDIAL INFARCTION       -COPD   CLINICAL FOLLOW-UP RECOMMENDED.   Marland Kitchen Troponin I 09/04/2015 0.05* <0.031 ng/mL Final   Comment:        PERSISTENTLY INCREASED TROPONIN VALUES IN THE RANGE OF 0.04-0.49 ng/mL CAN BE SEEN IN:       -UNSTABLE ANGINA       -CONGESTIVE HEART FAILURE       -MYOCARDITIS       -CHEST TRAUMA       -ARRYHTHMIAS       -LATE PRESENTING MYOCARDIAL INFARCTION       -COPD   CLINICAL FOLLOW-UP RECOMMENDED.   Marland Kitchen Lactic Acid, Venous 09/04/2015 2.3* 0.5 - 2.0 mmol/L Final   Comment: CRITICAL RESULT CALLED TO, READ BACK BY  AND VERIFIED WITH: THACKER,J RN 09/04/2015 0434 JORDANS   . Lactic Acid, Venous 09/04/2015 1.6  0.5 - 2.0 mmol/L Final  . Magnesium 09/04/2015 2.2  1.7 - 2.4 mg/dL Final  . Sodium 60/23/0678 133* 135 - 145 mmol/L Final  . Potassium 09/05/2015 4.3  3.5 - 5.1 mmol/L Final  . Chloride 09/05/2015 102  101 - 111 mmol/L Final  . CO2 09/05/2015 21* 22 - 32 mmol/L Final  . Glucose, Bld 09/05/2015 106* 65 - 99 mg/dL Final  . BUN 01/22/7001 16  6 - 20 mg/dL Final  . Creatinine, Ser 09/05/2015 0.99  0.61 - 1.24 mg/dL Final  . Calcium 24/55/2463 8.4* 8.9 - 10.3 mg/dL Final  . Total Protein 09/05/2015 6.8  6.5 - 8.1 g/dL Final  . Albumin 23/33/9348 2.1* 3.5 - 5.0 g/dL Final  . AST 80/12/9583 116* 15 - 41 U/L Final  . ALT 09/05/2015 66* 17 - 63 U/L Final  . Alkaline Phosphatase 09/05/2015 138* 38 - 126 U/L Final  . Total Bilirubin 09/05/2015 2.1* 0.3 - 1.2 mg/dL Final  . GFR calc non Af Amer 09/05/2015 >60  >60 mL/min Final  . GFR calc Af Amer 09/05/2015 >60  >60 mL/min Final   Comment: (NOTE) The eGFR has been calculated using the CKD EPI equation. This calculation has not been validated in all clinical situations. eGFR's persistently <60 mL/min signify possible Chronic Kidney Disease.   Eustaquio Boyden  gap 09/05/2015 10  5 - 15 Final  . WBC 09/05/2015 4.1  4.0 - 10.5 K/uL Final  . RBC 09/05/2015 3.36* 4.22 - 5.81 MIL/uL Final  . Hemoglobin 09/05/2015 12.1* 13.0 - 17.0 g/dL Final   REPEATED TO VERIFY  . HCT 09/05/2015 34.8* 39.0 - 52.0 % Final  . MCV 09/05/2015 103.6* 78.0 - 100.0 fL Final  . MCH 09/05/2015 36.0* 26.0 - 34.0 pg Final  . MCHC 09/05/2015 34.8  30.0 - 36.0 g/dL Final  . RDW 09/05/2015 13.5  11.5 - 15.5 % Final  . Platelets 09/05/2015 46* 150 - 400 K/uL Final   Comment: SPECIMEN CHECKED FOR CLOTS REPEATED TO VERIFY   . Ammonia 09/05/2015 47* 9 - 35 umol/L Final  . AFP-Tumor Marker 09/05/2015 34.4* 0.0 - 8.3 ng/mL Final   Comment: (NOTE) Roche ECLIA methodology Performed At: [facility] [place], [place] [id number] Lindon Romp MD LE:[id number]   . Weight 09/06/2015 2832.47   Final  . BP 09/06/2015 120/57   Final  . ABO/RH(D) 09/05/2015 O POS   Final  . Antibody Screen 09/05/2015 NEG   Final  . Sample Expiration 09/05/2015 09/08/2015   Final  . ABO/RH(D) 09/05/2015 O POS   Final  . WBC 09/06/2015 3.2* 4.0 - 10.5 K/uL Final  . RBC 09/06/2015 2.86* 4.22 - 5.81 MIL/uL Final  . Hemoglobin 09/06/2015 10.4* 13.0 - 17.0 g/dL Final  . HCT 09/06/2015 29.5* 39.0 - 52.0 % Final  . MCV 09/06/2015 103.1* 78.0 - 100.0 fL Final  . MCH 09/06/2015 36.4* 26.0 - 34.0 pg Final  . MCHC 09/06/2015 35.3  30.0 - 36.0 g/dL Final  . RDW 09/06/2015 13.3  11.5 - 15.5 % Final  . Platelets 09/06/2015 36* 150 - 400 K/uL Final   Comment: REPEATED TO VERIFY SPECIMEN CHECKED FOR CLOTS CONSISTENT WITH PREVIOUS RESULT   . Sodium 09/06/2015 130* 135 - 145 mmol/L Final  . Potassium 09/06/2015 4.1  3.5 - 5.1 mmol/L Final  . Chloride 09/06/2015 102  101 - 111 mmol/L Final  . CO2 09/06/2015 19* 22 - 32 mmol/L Final  . Glucose, Bld 09/06/2015 119* 65 - 99 mg/dL Final  . BUN 09/06/2015 15  6 - 20 mg/dL Final  . Creatinine, Ser 09/06/2015 1.02  0.61 - 1.24 mg/dL Final  . Calcium 09/06/2015 8.2* 8.9 - 10.3 mg/dL Final  . Total Protein 09/06/2015 6.3* 6.5 - 8.1 g/dL Final  . Albumin 09/06/2015 2.0* 3.5 - 5.0 g/dL Final  . AST 09/06/2015 112* 15 - 41 U/L Final  . ALT 09/06/2015 61  17 - 63 U/L Final  . Alkaline Phosphatase 09/06/2015 143* 38 - 126 U/L Final  . Total Bilirubin 09/06/2015 1.8* 0.3 - 1.2 mg/dL Final  . GFR calc non Af Amer 09/06/2015 >60  >60 mL/min Final  . GFR calc Af Amer 09/06/2015 >60  >60 mL/min Final   Comment: (NOTE) The eGFR has been calculated using the CKD EPI equation. This calculation has not been validated in all clinical situations. eGFR's persistently <60 mL/min signify possible Chronic Kidney Disease.   . Anion gap 09/06/2015 9  5 -  15 Final  . Unit Number 09/06/2015 [id number]   Final  . Blood Component Type 09/06/2015 PLTPHER LR1   Final  . Unit division 09/06/2015 00   Final  . Status of Unit 09/06/2015 ISSUED,FINAL   Final  . Transfusion Status 09/06/2015 OK TO TRANSFUSE   Final  . WBC 09/06/2015 3.6* 4.0 -  10.5 K/uL Final  . RBC 09/06/2015 3.16* 4.22 - 5.81 MIL/uL Final  . Hemoglobin 09/06/2015 11.4* 13.0 - 17.0 g/dL Final  . HCT 09/06/2015 32.5* 39.0 - 52.0 % Final  . MCV 09/06/2015 102.8* 78.0 - 100.0 fL Final  . MCH 09/06/2015 36.1* 26.0 - 34.0 pg Final  . MCHC 09/06/2015 35.1  30.0 - 36.0 g/dL Final  . RDW 09/06/2015 13.3  11.5 - 15.5 % Final  . Platelets 09/06/2015 41* 150 - 400 K/uL Final   Comment: CONSISTENT WITH PREVIOUS RESULT REPEATED TO VERIFY   . Vitamin B-12 09/06/2015 1377* 180 - 914 pg/mL Final   Comment: (NOTE) This assay is not validated for testing neonatal or myeloproliferative syndrome specimens for Vitamin B12 levels.   . Folate 09/06/2015 20.8  >5.9 ng/mL Final  . Iron 09/06/2015 157  45 - 182 ug/dL Final  . TIBC 09/06/2015 188* 250 - 450 ug/dL Final  . Saturation Ratios 09/06/2015 84* 17.9 - 39.5 % Final  . UIBC 09/06/2015 31   Final  . Ferritin 09/06/2015 1295* 24 - 336 ng/mL Final  . Retic Ct Pct 09/06/2015 2.4  0.4 - 3.1 % Final  . RBC. 09/06/2015 3.16* 4.22 - 5.81 MIL/uL Final  . Retic Count, Manual 09/06/2015 75.8  19.0 - 186.0 K/uL Final  . WBC 09/07/2015 4.2  4.0 - 10.5 K/uL Final  . RBC 09/07/2015 2.77* 4.22 - 5.81 MIL/uL Final  . Hemoglobin 09/07/2015 10.0* 13.0 - 17.0 g/dL Final  . HCT 09/07/2015 28.3* 39.0 - 52.0 % Final  . MCV 09/07/2015 102.2* 78.0 - 100.0 fL Final  . MCH 09/07/2015 36.1* 26.0 - 34.0 pg Final  . MCHC 09/07/2015 35.3  30.0 - 36.0 g/dL Final  . RDW 09/07/2015 13.2  11.5 - 15.5 % Final  . Platelets 09/07/2015 49* 150 - 400 K/uL Final   CONSISTENT WITH PREVIOUS RESULT  . Sodium 09/07/2015 130* 135 - 145 mmol/L Final  . Potassium 09/07/2015 3.6   3.5 - 5.1 mmol/L Final  . Chloride 09/07/2015 102  101 - 111 mmol/L Final  . CO2 09/07/2015 20* 22 - 32 mmol/L Final  . Glucose, Bld 09/07/2015 186* 65 - 99 mg/dL Final  . BUN 09/07/2015 13  6 - 20 mg/dL Final  . Creatinine, Ser 09/07/2015 0.90  0.61 - 1.24 mg/dL Final  . Calcium 09/07/2015 8.0* 8.9 - 10.3 mg/dL Final  . Total Protein 09/07/2015 5.9* 6.5 - 8.1 g/dL Final  . Albumin 09/07/2015 1.9* 3.5 - 5.0 g/dL Final  . AST 09/07/2015 103* 15 - 41 U/L Final  . ALT 09/07/2015 56  17 - 63 U/L Final  . Alkaline Phosphatase 09/07/2015 142* 38 - 126 U/L Final  . Total Bilirubin 09/07/2015 2.1* 0.3 - 1.2 mg/dL Final  . GFR calc non Af Amer 09/07/2015 >60  >60 mL/min Final  . GFR calc Af Amer 09/07/2015 >60  >60 mL/min Final   Comment: (NOTE) The eGFR has been calculated using the CKD EPI equation. This calculation has not been validated in all clinical situations. eGFR's persistently <60 mL/min signify possible Chronic Kidney Disease.   . Anion gap 09/07/2015 8  5 - 15 Final  Admission on 07/23/2015, Discharged on 07/29/2015  Component Date Value Ref Range Status  . Glucose-Capillary 07/23/2015 203* 65 - 99 mg/dL Final  . Comment 1 07/23/2015 Notify RN   Final  . WBC 07/23/2015 3.3* 4.0 - 10.5 K/uL Final  . RBC 07/23/2015 3.36* 4.22 - 5.81 MIL/uL Final  . Hemoglobin  07/23/2015 12.0* 13.0 - 17.0 g/dL Final  . HCT 07/23/2015 35.0* 39.0 - 52.0 % Final  . MCV 07/23/2015 104.2* 78.0 - 100.0 fL Final  . MCH 07/23/2015 35.7* 26.0 - 34.0 pg Final  . MCHC 07/23/2015 34.3  30.0 - 36.0 g/dL Final  . RDW 07/23/2015 14.0  11.5 - 15.5 % Final  . Platelets 07/23/2015 52* 150 - 400 K/uL Final   CONSISTENT WITH PREVIOUS RESULT  . Neutrophils Relative % 07/23/2015 52   Final  . Neutro Abs 07/23/2015 1.7  1.7 - 7.7 K/uL Final  . Lymphocytes Relative 07/23/2015 26   Final  . Lymphs Abs 07/23/2015 0.9  0.7 - 4.0 K/uL Final  . Monocytes Relative 07/23/2015 18   Final  . Monocytes Absolute 07/23/2015  0.6  0.1 - 1.0 K/uL Final  . Eosinophils Relative 07/23/2015 3   Final  . Eosinophils Absolute 07/23/2015 0.1  0.0 - 0.7 K/uL Final  . Basophils Relative 07/23/2015 1   Final  . Basophils Absolute 07/23/2015 0.0  0.0 - 0.1 K/uL Final  . Sodium 07/23/2015 138  135 - 145 mmol/L Final  . Potassium 07/23/2015 2.9* 3.5 - 5.1 mmol/L Final  . Chloride 07/23/2015 103  101 - 111 mmol/L Final  . CO2 07/23/2015 24  22 - 32 mmol/L Final  . Glucose, Bld 07/23/2015 138* 65 - 99 mg/dL Final  . BUN 07/23/2015 35* 6 - 20 mg/dL Final  . Creatinine, Ser 07/23/2015 1.88* 0.61 - 1.24 mg/dL Final  . Calcium 07/23/2015 8.1* 8.9 - 10.3 mg/dL Final  . Total Protein 07/23/2015 6.5  6.5 - 8.1 g/dL Final  . Albumin 07/23/2015 2.2* 3.5 - 5.0 g/dL Final  . AST 07/23/2015 124* 15 - 41 U/L Final  . ALT 07/23/2015 58  17 - 63 U/L Final  . Alkaline Phosphatase 07/23/2015 225* 38 - 126 U/L Final  . Total Bilirubin 07/23/2015 1.2  0.3 - 1.2 mg/dL Final  . GFR calc non Af Amer 07/23/2015 37* >60 mL/min Final  . GFR calc Af Amer 07/23/2015 43* >60 mL/min Final   Comment: (NOTE) The eGFR has been calculated using the CKD EPI equation. This calculation has not been validated in all clinical situations. eGFR's persistently <60 mL/min signify possible Chronic Kidney Disease.   . Anion gap 07/23/2015 11  5 - 15 Final  . Lactic Acid, Venous 07/23/2015 1.84  0.5 - 2.0 mmol/L Final  . Ammonia 07/23/2015 264* 9 - 35 umol/L Final  . Color, Urine 07/23/2015 AMBER* YELLOW Final   BIOCHEMICALS MAY BE AFFECTED BY COLOR  . APPearance 07/23/2015 CLEAR  CLEAR Final  . Specific Gravity, Urine 07/23/2015 1.016  1.005 - 1.030 Final  . pH 07/23/2015 6.0  5.0 - 8.0 Final  . Glucose, UA 07/23/2015 NEGATIVE  NEGATIVE mg/dL Final  . Hgb urine dipstick 07/23/2015 NEGATIVE  NEGATIVE Final  . Bilirubin Urine 07/23/2015 SMALL* NEGATIVE Final  . Ketones, ur 07/23/2015 NEGATIVE  NEGATIVE mg/dL Final  . Protein, ur 07/23/2015 NEGATIVE  NEGATIVE  mg/dL Final  . Nitrite 07/23/2015 NEGATIVE  NEGATIVE Final  . Leukocytes, UA 07/23/2015 NEGATIVE  NEGATIVE Final   MICROSCOPIC NOT DONE ON URINES WITH NEGATIVE PROTEIN, BLOOD, LEUKOCYTES, NITRITE, OR GLUCOSE <1000 mg/dL.  [facility] Kitchen Specimen Description 07/23/2015 URINE, CATHETERIZED   Final  . Special Requests 07/23/2015 NONE   Final  . Culture 07/23/2015 NO GROWTH 1 DAY   Final  . Report Status 07/23/2015 07/25/2015 FINAL   Final  . Specimen Description 07/23/2015 BLOOD RIGHT  ANTECUBITAL   Final  . Special Requests 07/23/2015 BOTTLES DRAWN AEROBIC AND ANAEROBIC 5CC   Final  . Culture 07/23/2015 NO GROWTH 5 DAYS   Final  . Report Status 07/23/2015 07/28/2015 FINAL   Final  . Specimen Description 07/23/2015 BLOOD LEFT ANTECUBITAL   Final  . Special Requests 07/23/2015 BOTTLES DRAWN AEROBIC AND ANAEROBIC 5CC   Final  . Culture 07/23/2015 NO GROWTH 5 DAYS   Final  . Report Status 07/23/2015 07/28/2015 FINAL   Final  . pH, Ven 07/23/2015 7.520* 7.250 - 7.300 Final  . pCO2, Ven 07/23/2015 29.9* 45.0 - 50.0 mmHg Final  . pO2, Ven 07/23/2015 39.0  31.0 - 45.0 mmHg Final  . Bicarbonate 07/23/2015 24.4* 20.0 - 24.0 mEq/L Final  . TCO2 07/23/2015 25  0 - 100 mmol/L Final  . O2 Saturation 07/23/2015 81.0   Final  . Acid-Base Excess 07/23/2015 2.0  0.0 - 2.0 mmol/L Final  . Patient temperature 07/23/2015 HIDE   Final  . Sample type 07/23/2015 VENOUS   Final  . Comment 07/23/2015 NOTIFIED PHYSICIAN   Final  . Prothrombin Time 07/23/2015 16.2* 11.6 - 15.2 seconds Final  . INR 07/23/2015 1.29  0.00 - 1.49 Final  . Alcohol, Ethyl (B) 07/23/2015 <5  <5 mg/dL Final   Comment:        LOWEST DETECTABLE LIMIT FOR SERUM ALCOHOL IS 5 mg/dL FOR MEDICAL PURPOSES ONLY   . Opiates 07/23/2015 NONE DETECTED  NONE DETECTED Final  . Cocaine 07/23/2015 NONE DETECTED  NONE DETECTED Final  . Benzodiazepines 07/23/2015 NONE DETECTED  NONE DETECTED Final  . Amphetamines 07/23/2015 NONE DETECTED  NONE DETECTED Final  .  Tetrahydrocannabinol 07/23/2015 NONE DETECTED  NONE DETECTED Final  . Barbiturates 07/23/2015 NONE DETECTED  NONE DETECTED Final   Comment:        DRUG SCREEN FOR MEDICAL PURPOSES ONLY.  IF CONFIRMATION IS NEEDED FOR ANY PURPOSE, NOTIFY LAB WITHIN 5 DAYS.        LOWEST DETECTABLE LIMITS FOR URINE DRUG SCREEN Drug Class       Cutoff (ng/mL) Amphetamine      1000 Barbiturate      200 Benzodiazepine   200 Tricyclics       300 Opiates          300 Cocaine          300 THC              50   . Troponin i, poc 07/23/2015 0.02  0.00 - 0.08 ng/mL Final  . Comment 3 07/23/2015          Final   Comment: Due to the release kinetics of cTnI, a negative result within the first hours of the onset of symptoms does not rule out myocardial infarction with certainty. If myocardial infarction is still suspected, repeat the test at appropriate intervals.   . Lactic Acid, Venous 07/23/2015 1.75  0.5 - 2.0 mmol/L Final  . Sodium 07/24/2015 141  135 - 145 mmol/L Final  . Potassium 07/24/2015 3.1* 3.5 - 5.1 mmol/L Final  . Chloride 07/24/2015 108  101 - 111 mmol/L Final  . CO2 07/24/2015 24  22 - 32 mmol/L Final  . Glucose, Bld 07/24/2015 121* 65 - 99 mg/dL Final  . BUN 06/98/6148 30* 6 - 20 mg/dL Final  . Creatinine, Ser 07/24/2015 1.41* 0.61 - 1.24 mg/dL Final  . Calcium 30/73/5430 8.2* 8.9 - 10.3 mg/dL Final  . Total Protein 07/24/2015 6.1* 6.5 - 8.1 g/dL  Final  . Albumin 07/24/2015 1.9* 3.5 - 5.0 g/dL Final  . AST 07/24/2015 114* 15 - 41 U/L Final  . ALT 07/24/2015 56  17 - 63 U/L Final  . Alkaline Phosphatase 07/24/2015 189* 38 - 126 U/L Final  . Total Bilirubin 07/24/2015 1.7* 0.3 - 1.2 mg/dL Final  . GFR calc non Af Amer 07/24/2015 53* >60 mL/min Final  . GFR calc Af Amer 07/24/2015 >60  >60 mL/min Final   Comment: (NOTE) The eGFR has been calculated using the CKD EPI equation. This calculation has not been validated in all clinical situations. eGFR's persistently <60 mL/min signify  possible Chronic Kidney Disease.   . Anion gap 07/24/2015 9  5 - 15 Final  . WBC 07/24/2015 3.3* 4.0 - 10.5 K/uL Final  . RBC 07/24/2015 3.38* 4.22 - 5.81 MIL/uL Final  . Hemoglobin 07/24/2015 11.9* 13.0 - 17.0 g/dL Final  . HCT 07/24/2015 35.3* 39.0 - 52.0 % Final  . MCV 07/24/2015 104.4* 78.0 - 100.0 fL Final  . MCH 07/24/2015 35.2* 26.0 - 34.0 pg Final  . MCHC 07/24/2015 33.7  30.0 - 36.0 g/dL Final  . RDW 07/24/2015 14.0  11.5 - 15.5 % Final  . Platelets 07/24/2015 59* 150 - 400 K/uL Final   CONSISTENT WITH PREVIOUS RESULT  . Neutrophils Relative % 07/24/2015 55   Final  . Neutro Abs 07/24/2015 1.8  1.7 - 7.7 K/uL Final  . Lymphocytes Relative 07/24/2015 31   Final  . Lymphs Abs 07/24/2015 1.0  0.7 - 4.0 K/uL Final  . Monocytes Relative 07/24/2015 11   Final  . Monocytes Absolute 07/24/2015 0.4  0.1 - 1.0 K/uL Final  . Eosinophils Relative 07/24/2015 2   Final  . Eosinophils Absolute 07/24/2015 0.1  0.0 - 0.7 K/uL Final  . Basophils Relative 07/24/2015 1   Final  . Basophils Absolute 07/24/2015 0.0  0.0 - 0.1 K/uL Final  . Magnesium 07/24/2015 2.4  1.7 - 2.4 mg/dL Final  . Ammonia 07/24/2015 130* 9 - 35 umol/L Final  . Potassium 07/24/2015 3.1* 3.5 - 5.1 mmol/L Final  . B Natriuretic Peptide 07/24/2015 39.8  0.0 - 100.0 pg/mL Final  . Procalcitonin 07/24/2015 0.23   Final   Comment:        Interpretation: PCT (Procalcitonin) <= 0.5 ng/mL: Systemic infection (sepsis) is not likely. Local bacterial infection is possible. (NOTE)         ICU PCT Algorithm               Non ICU PCT Algorithm    ----------------------------     ------------------------------         PCT < 0.25 ng/mL                 PCT < 0.1 ng/mL     Stopping of antibiotics            Stopping of antibiotics       strongly encouraged.               strongly encouraged.    ----------------------------     ------------------------------       PCT level decrease by               PCT < 0.25 ng/mL       >= 80%  from peak PCT       OR PCT 0.25 - 0.5 ng/mL          Stopping of antibiotics  encouraged.     Stopping of antibiotics           encouraged.    ----------------------------     ------------------------------       PCT level decrease by              PCT >= 0.25 ng/mL       < 80% from peak PCT        AND PCT >= 0.5 ng/mL            Continuin                          g antibiotics                                              encouraged.       Continuing antibiotics            encouraged.    ----------------------------     ------------------------------     PCT level increase compared          PCT > 0.5 ng/mL         with peak PCT AND          PCT >= 0.5 ng/mL             Escalation of antibiotics                                          strongly encouraged.      Escalation of antibiotics        strongly encouraged.   . Lactic Acid, Venous 07/24/2015 1.5  0.5 - 2.0 mmol/L Final  . Glucose-Capillary 07/24/2015 141* 65 - 99 mg/dL Final  . MRSA by PCR 07/24/2015 NEGATIVE  NEGATIVE Final   Comment:        The GeneXpert MRSA Assay (FDA approved for NASAL specimens only), is one component of a comprehensive MRSA colonization surveillance program. It is not intended to diagnose MRSA infection nor to guide or monitor treatment for MRSA infections.   . Glucose-Capillary 07/24/2015 171* 65 - 99 mg/dL Final  . Comment 1 07/24/2015 Notify RN   Final  . Glucose-Capillary 07/25/2015 123* 65 - 99 mg/dL Final  . Comment 1 07/25/2015 Venous Specimen   Final  . Sodium 07/26/2015 138  135 - 145 mmol/L Final  . Potassium 07/26/2015 3.1* 3.5 - 5.1 mmol/L Final  . Chloride 07/26/2015 108  101 - 111 mmol/L Final  . CO2 07/26/2015 23  22 - 32 mmol/L Final  . Glucose, Bld 07/26/2015 138* 65 - 99 mg/dL Final  . BUN 07/26/2015 11  6 - 20 mg/dL Final  . Creatinine, Ser 07/26/2015 1.01  0.61 - 1.24 mg/dL Final  . Calcium 07/26/2015 7.9* 8.9 - 10.3 mg/dL Final   . GFR calc non Af Amer 07/26/2015 >60  >60 mL/min Final  . GFR calc Af Amer 07/26/2015 >60  >60 mL/min Final   Comment: (NOTE) The eGFR has been calculated using the CKD EPI equation. This calculation has not been validated in all clinical situations. eGFR's persistently <60 mL/min signify possible Chronic Kidney Disease.   . Anion gap 07/26/2015 7  5 - 15 Final  . WBC 07/26/2015 3.4* 4.0 -  10.5 K/uL Final  . RBC 07/26/2015 3.03* 4.22 - 5.81 MIL/uL Final  . Hemoglobin 07/26/2015 10.7* 13.0 - 17.0 g/dL Final  . HCT 07/26/2015 31.6* 39.0 - 52.0 % Final  . MCV 07/26/2015 104.3* 78.0 - 100.0 fL Final  . MCH 07/26/2015 35.3* 26.0 - 34.0 pg Final  . MCHC 07/26/2015 33.9  30.0 - 36.0 g/dL Final  . RDW 07/26/2015 13.9  11.5 - 15.5 % Final  . Platelets 07/26/2015 47* 150 - 400 K/uL Final   CONSISTENT WITH PREVIOUS RESULT  . Glucose-Capillary 07/26/2015 134* 65 - 99 mg/dL Final  . Sodium 07/27/2015 140  135 - 145 mmol/L Final  . Potassium 07/27/2015 3.8  3.5 - 5.1 mmol/L Final   DELTA CHECK NOTED  . Chloride 07/27/2015 110  101 - 111 mmol/L Final  . CO2 07/27/2015 22  22 - 32 mmol/L Final  . Glucose, Bld 07/27/2015 117* 65 - 99 mg/dL Final  . BUN 07/27/2015 14  6 - 20 mg/dL Final  . Creatinine, Ser 07/27/2015 1.18  0.61 - 1.24 mg/dL Final  . Calcium 07/27/2015 8.4* 8.9 - 10.3 mg/dL Final  . GFR calc non Af Amer 07/27/2015 >60  >60 mL/min Final  . GFR calc Af Amer 07/27/2015 >60  >60 mL/min Final   Comment: (NOTE) The eGFR has been calculated using the CKD EPI equation. This calculation has not been validated in all clinical situations. eGFR's persistently <60 mL/min signify possible Chronic Kidney Disease.   . Anion gap 07/27/2015 8  5 - 15 Final  . WBC 07/27/2015 3.4* 4.0 - 10.5 K/uL Final  . RBC 07/27/2015 3.33* 4.22 - 5.81 MIL/uL Final  . Hemoglobin 07/27/2015 12.0* 13.0 - 17.0 g/dL Final  . HCT 07/27/2015 34.9* 39.0 - 52.0 % Final  . MCV 07/27/2015 104.8* 78.0 - 100.0 fL Final   . MCH 07/27/2015 36.0* 26.0 - 34.0 pg Final  . MCHC 07/27/2015 34.4  30.0 - 36.0 g/dL Final  . RDW 07/27/2015 13.8  11.5 - 15.5 % Final  . Platelets 07/27/2015 48* 150 - 400 K/uL Final   CONSISTENT WITH PREVIOUS RESULT  . Glucose-Capillary 07/27/2015 136* 65 - 99 mg/dL Final  . Glucose-Capillary 07/28/2015 122* 65 - 99 mg/dL Final  . Comment 1 07/28/2015 Notify RN   Final  . Comment 2 07/28/2015 Document in Chart   Final  . WBC 07/28/2015 3.9* 4.0 - 10.5 K/uL Final  . RBC 07/28/2015 3.40* 4.22 - 5.81 MIL/uL Final  . Hemoglobin 07/28/2015 12.0* 13.0 - 17.0 g/dL Final  . HCT 07/28/2015 36.0* 39.0 - 52.0 % Final  . MCV 07/28/2015 105.9* 78.0 - 100.0 fL Final  . MCH 07/28/2015 35.3* 26.0 - 34.0 pg Final  . MCHC 07/28/2015 33.3  30.0 - 36.0 g/dL Final  . RDW 07/28/2015 13.9  11.5 - 15.5 % Final  . Platelets 07/28/2015 45* 150 - 400 K/uL Final   CONSISTENT WITH PREVIOUS RESULT  . Sodium 07/28/2015 139  135 - 145 mmol/L Final  . Potassium 07/28/2015 4.1  3.5 - 5.1 mmol/L Final  . Chloride 07/28/2015 112* 101 - 111 mmol/L Final  . CO2 07/28/2015 21* 22 - 32 mmol/L Final  . Glucose, Bld 07/28/2015 154* 65 - 99 mg/dL Final  . BUN 07/28/2015 16  6 - 20 mg/dL Final  . Creatinine, Ser 07/28/2015 1.07  0.61 - 1.24 mg/dL Final  . Calcium 07/28/2015 8.0* 8.9 - 10.3 mg/dL Final  . GFR calc non Af Amer 07/28/2015 >60  >60 mL/min  Final  . GFR calc Af Amer 07/28/2015 >60  >60 mL/min Final   Comment: (NOTE) The eGFR has been calculated using the CKD EPI equation. This calculation has not been validated in all clinical situations. eGFR's persistently <60 mL/min signify possible Chronic Kidney Disease.   . Anion gap 07/28/2015 6  5 - 15 Final  . Glucose-Capillary 07/29/2015 93  65 - 99 mg/dL Final   Dg Knee 1-2 Views Left  09/04/2015  CLINICAL DATA:  Left knee pain for several months without known injury. EXAM: LEFT KNEE - 1-2 VIEW COMPARISON:  July 24, 2015. FINDINGS: There is no evidence of  fracture, dislocation, or joint effusion. There is noted increased sclerosis involving the articular surface of the medial femoral condyle. Joint spaces appear intact. Vascular calcifications are noted. IMPRESSION: No acute fracture or dislocation is noted. Focal increased sclerosis is seen involving the articular surface of the medial femoral condyle which may represent early degenerative change. Electronically Signed   By: Marijo Conception, M.D.   On: 09/04/2015 09:09   Ct Head Wo Contrast  09/04/2015  CLINICAL DATA:  Weakness.  Nausea and vomiting. EXAM: CT HEAD WITHOUT CONTRAST TECHNIQUE: Contiguous axial images were obtained from the base of the skull through the vertex without intravenous contrast. COMPARISON:  07/23/2015 FINDINGS: There is no intracranial hemorrhage, mass or evidence of acute infarction. There is mild white matter hypodensity consistent with chronic microvascular ischemic change. There is no significant extra-axial fluid collection. No acute intracranial findings are evident. The calvarium and skullbase are intact. Visible paranasal sinuses and orbits are unremarkable. IMPRESSION: No acute intracranial findings. There are chronic appearing white matter hypodensities which likely represent small vessel ischemic disease. Electronically Signed   By: Andreas Newport M.D.   On: 09/04/2015 05:27   US Abdomen Complete  09/04/2015  CLINICAL DATA:  Elevated liver function studies, onset of abdominal pain yesterday; history of alcoholic cirrhosis, hepatitis-C EXAM: ABDOMEN ULTRASOUND COMPLETE COMPARISON:  Abdominal ultrasound of April 26, 2013 FINDINGS: Gallbladder: The gallbladder is adequately distended. Echogenic sludge and mobile stones are present. The largest stone measures 2.2 cm in diameter. There is mild gallbladder wall thickening at 3.6 mm. There is no pericholecystic fluid or positive sonographic Murphy's sign. Common bile duct: Diameter: 5.2 mm Liver: There is heterogeneously  increased hepatic echotexture. The surface contour of the liver is irregular. There is a solid-appearing hypoechoic mass in the right hepatic lobe measuring 3 x 2.4 x 2.6 cm. IVC: No abnormality visualized. Pancreas: No abnormality visualized Spleen: Size and appearance within normal limits. There is an accessory spleen measuring 1.8 cm in greatest dimension. Right Kidney: Length: 11.7 cm. The renal cortical echotexture remains lower than that of the adjacent liver. There is mild hydronephrosis. Left Kidney: Length: 12.3 cm. Echogenicity within normal limits. No mass or hydronephrosis visualized. Abdominal aorta: Bowel gas limits evaluation of the abdominal aorta. Other findings: No ascites is observed. IMPRESSION: 1. Heterogeneously increased hepatic echotexture with irregularity of the hepatic contour consistent with known cirrhosis. Solid-appearing right lobe mass measuring 3 cm in greatest dimension. Further evaluation with hepatic protocol MRI is recommended. 2. Gallstones without sonographic evidence of acute cholecystitis. There is mild gallbladder wall thickening which has been observed in the past. 3. Chronic mild right-sided hydronephrosis. Electronically Signed   By: David  Martinique M.D.   On: 09/04/2015 10:04   Mr 3d Recon At Scanner  09/05/2015  CLINICAL DATA:  Cirrhosis with hepatic mass seen on recent ultrasound. EXAM: MRI ABDOMEN WITHOUT AND  WITH CONTRAST (INCLUDING MRCP) TECHNIQUE: Multiplanar multisequence MR imaging of the abdomen was performed both before and after the administration of intravenous contrast. Heavily T2-weighted images of the biliary and pancreatic ducts were obtained, and three-dimensional MRCP images were rendered by post processing. CONTRAST:  8 cc Eovist COMPARISON:  Ultrasound 09/04/2015 FINDINGS: Examination is limited by respiratory motion. Also, I do not have early arterial phase imaging. There is a 2.8 cm segment 7 hepatic lesion which has slight increased T2 signal  intensity. This is bright on the T1 weighted gradient echo sequences surrounded by a background of diffuse fatty infiltration. The contrast enhanced images wall appear the same. Contrast enhancement is difficult to assess because the lesion is bright on T1. Extremely low T2 signal intensity in the liver and pancreas suggest iron deposition. The spleen appears normal. The gallbladder demonstrates gallstones, wall thickening and pericholecystic fluid. The common bile duct is normal in caliber and has a normal course. No common bile duct stones. The adrenal glands and kidneys are unremarkable. No mesenteric or retroperitoneal mass or adenopathy. Extensive portal venous collaterals due to portal hypertension. Esophageal varices are also noted. The portal vein is patent. Small amount of perihepatic ascites. No significant bony findings. IMPRESSION: 1. Limited examination due to timing of the contrast administration and lack of precontrast LAVA sequence. There is a T1 bright 2.8 cm lesion in segment 7 of the liver. Contrast enhancement is difficult to assess but this is considered a hepatoma. Recommend ultrasound-guided biopsy. 2. Advanced cirrhotic changes with portal venous hypertension, portal venous collaterals and esophageal varices. 3. Suspect iron deposition in liver and pancreas. 4. Gallstones, gallbladder wall thickening and pericholecystic fluid. Electronically Signed   By: Marijo Sanes M.D.   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 Please perform ultrasound-guided biopsy for tissue diagnostic purposes. EXAM: ULTRASOUND GUIDED LIVER LESION BIOPSY COMPARISON:  Abdominal MRI - 09/05/2015 MEDICATIONS: None ANESTHESIA/SEDATION: Fentanyl 50 mcg IV; Versed 1 mg IV Total Moderate Sedation time: 12 minutes. The patient's level of consciousness  and vital signs were monitored continuously by radiology nursing throughout the procedure under my direct supervision. COMPLICATIONS: None immediate. PROCEDURE: Informed written consent was obtained from the patient after a discussion of the risks, benefits and alternatives to treatment. The patient understands and consents the procedure. A timeout was performed prior to the initiation of the procedure. Ultrasound scanning was performed of the right upper abdominal quadrant demonstrates an approximately 2.5 x 1.9 cm lesion within medial aspect of the posterior segment of the right lobe of the liver (representative image 9). The procedure was planned. The right upper abdominal quadrant was prepped and draped in the usual sterile fashion. The overlying soft tissues were anesthetized with 1% lidocaine with epinephrine. A 17 gauge, 6.8 cm co-axial needle was advanced into a peripheral aspect of the lesion. This was followed by 3 core biopsies with an 18 gauge core device under direct ultrasound guidance. The coaxial needle tract was embolized with a small amount of Gel-Foam slurry and superficial hemostasis was obtained with manual compression. Post procedural scanning was negative for definitive area of hemorrhage or additional complication. A dressing was placed. The patient tolerated the procedure well without immediate post procedural complication. IMPRESSION: Technically successful ultrasound guided core needle biopsy of indeterminate approximately 2.7 cm lesion within the medial aspect of the posterior segment of the right lobe.  Electronically Signed   By: Sandi Mariscal M.D.   On: 09/06/2015 13:32   Dg Chest Port 1 View  09/03/2015  CLINICAL DATA:  Generalized weakness and cough for 3 days EXAM: PORTABLE CHEST 1 VIEW COMPARISON:  07/23/2015 FINDINGS: A single AP portable view of the chest demonstrates no focal airspace consolidation or alveolar edema. The lungs are grossly clear. There is no large effusion or  pneumothorax. Cardiac and mediastinal contours appear unremarkable. IMPRESSION: No active disease. Electronically Signed   By: Andreas Newport M.D.   On: 09/03/2015 22:05   Mr Abd W/wo Cm/mrcp  09/05/2015  CLINICAL DATA:  Cirrhosis with hepatic mass seen on recent ultrasound. EXAM: MRI ABDOMEN WITHOUT AND WITH CONTRAST (INCLUDING MRCP) TECHNIQUE: Multiplanar multisequence MR imaging of the abdomen was performed both before and after the administration of intravenous contrast. Heavily T2-weighted images of the biliary and pancreatic ducts were obtained, and three-dimensional MRCP images were rendered by post processing. CONTRAST:  8 cc Eovist COMPARISON:  Ultrasound 09/04/2015 FINDINGS: Examination is limited by respiratory motion. Also, I do not have early arterial phase imaging. There is a 2.8 cm segment 7 hepatic lesion which has slight increased T2 signal intensity. This is bright on the T1 weighted gradient echo sequences surrounded by a background of diffuse fatty infiltration. The contrast enhanced images wall appear the same. Contrast enhancement is difficult to assess because the lesion is bright on T1. Extremely low T2 signal intensity in the liver and pancreas suggest iron deposition. The spleen appears normal. The gallbladder demonstrates gallstones, wall thickening and pericholecystic fluid. The common bile duct is normal in caliber and has a normal course. No common bile duct stones. The adrenal glands and kidneys are unremarkable. No mesenteric or retroperitoneal mass or adenopathy. Extensive portal venous collaterals due to portal hypertension. Esophageal varices are also noted. The portal vein is patent. Small amount of perihepatic ascites. No significant bony findings. IMPRESSION: 1. Limited examination due to timing of the contrast administration and lack of precontrast LAVA sequence. There is a T1 bright 2.8 cm lesion in segment 7 of the liver. Contrast enhancement is difficult to assess but  this is considered a hepatoma. Recommend ultrasound-guided biopsy. 2. Advanced cirrhotic changes with portal venous hypertension, portal venous collaterals and esophageal varices. 3. Suspect iron deposition in liver and pancreas. 4. Gallstones, gallbladder wall thickening and pericholecystic fluid. Electronically Signed   By: Marijo Sanes M.D.   On: 09/05/2015 13:44     Assessment/Plan   ICD-9-CM ICD-10-CM   1. Unilateral inguinal hernia without obstruction or gangrene, recurrence not specified 550.90 K40.90   2. Hepatic encephalopathy (HCC) 572.2 K72.90   3. Alcoholic cirrhosis of liver with ascites (HCC) 571.2 K70.31   4. Alcoholism ([place]) 303.90 F10.20   5. Essential hypertension 401.9 I10   6. Gastroesophageal reflux disease without esophagitis 530.81 K21.9   7. Protein-calorie malnutrition, severe ([place]) 262 E43   8. Anemia of chronic disease 285.29 D63.8   9. Arthritis of left knee 716.96 M12.9       Check CBC w diff, CMP, NH3 level and magnesium level  Urology eval for right inguinal hernia  GI eval for hepatic cirrhosis f/u  Follow liver mass bx results which are still pending  Palliative care consult  Cont current meds as ordered. Complete abx  PT/OT/ST as ordered  GOAL: short term rehab and d/c to home vs ALF. Communicated with pt and nursing.  Will follow  Jose [name] S. Eulas Post, D. O., F. [place]  Meda Coffee  [facility] [place], [place] [address] [phone] Cell (Monday-Friday 8 AM - 5 PM) [phone] After 5 PM and follow prompts

## 2015-09-12 DIAGNOSIS — D638 Anemia in other chronic diseases classified elsewhere: Secondary | ICD-10-CM | POA: Insufficient documentation

## 2015-09-12 DIAGNOSIS — M1712 Unilateral primary osteoarthritis, left knee: Secondary | ICD-10-CM | POA: Insufficient documentation

## 2015-09-12 DIAGNOSIS — I10 Essential (primary) hypertension: Secondary | ICD-10-CM | POA: Insufficient documentation

## 2015-09-12 DIAGNOSIS — K4090 Unilateral inguinal hernia, without obstruction or gangrene, not specified as recurrent: Secondary | ICD-10-CM | POA: Insufficient documentation

## 2015-09-12 DIAGNOSIS — K219 Gastro-esophageal reflux disease without esophagitis: Secondary | ICD-10-CM | POA: Insufficient documentation

## 2015-09-12 DIAGNOSIS — E43 Unspecified severe protein-calorie malnutrition: Secondary | ICD-10-CM | POA: Insufficient documentation

## 2015-09-12 LAB — BASIC METABOLIC PANEL
BUN: 9 mg/dL (ref 4–21)
Creatinine: 0.9 mg/dL (ref 0.6–1.3)
GLUCOSE: 117 mg/dL
POTASSIUM: 4.1 mmol/L (ref 3.4–5.3)
Sodium: 133 mmol/L — AB (ref 137–147)

## 2015-09-12 LAB — CBC AND DIFFERENTIAL
HEMATOCRIT: 35 % — AB (ref 41–53)
HEMOGLOBIN: 12.2 g/dL — AB (ref 13.5–17.5)
PLATELETS: 50 10*3/uL — AB (ref 150–399)
WBC: 4.1 10*3/mL

## 2015-09-12 LAB — HEPATIC FUNCTION PANEL
ALT: 57 U/L — AB (ref 10–40)
AST: 100 U/L — AB (ref 14–40)
Alkaline Phosphatase: 151 U/L — AB (ref 25–125)
Bilirubin, Total: 2 mg/dL

## 2015-09-26 ENCOUNTER — Encounter: Payer: Self-pay | Admitting: Adult Health

## 2015-09-26 ENCOUNTER — Non-Acute Institutional Stay (SKILLED_NURSING_FACILITY): Payer: Medicaid Other | Admitting: Adult Health

## 2015-09-26 DIAGNOSIS — K7290 Hepatic failure, unspecified without coma: Secondary | ICD-10-CM

## 2015-09-26 DIAGNOSIS — K7682 Hepatic encephalopathy: Secondary | ICD-10-CM

## 2015-09-26 LAB — AMMONIA
ALBUMIN: 2.6
Ammonia: 138
Ammonia: 187
Calcium: 7.8 mg/dL

## 2015-09-26 MED ORDER — RIFAXIMIN 550 MG PO TABS: 550.0000 mg | ORAL_TABLET | Freq: Two times a day (BID) | ORAL | Status: DC

## 2015-09-26 NOTE — Progress Notes (Signed)
Patient ID: Jose [name], male   DOB: [birth year], 60 y.o.   MRN: [id number]   Location:  [facility] Room Number: 143-B Place of Service:  SNF (31)     CODE STATUS: Full Code  No Known Allergies  Chief Complaint  Patient presents with  . Acute Visit    Lab Review    HPI:    Past Medical History  Diagnosis Date  . Hypertension   . Hepatitis C   . Alcohol abuse   . Ascites   . Cirrhosis ([place])   . Shortness of breath     with exertion  . Depression   . Alcoholism ([place]) 07/12/2015    Past Surgical History  Procedure Laterality Date  . Colonoscopy  march 2015    Dr. Renee Harder: nodular mucosa at appendiceal orifice, tubular adenoma, extremely poor prep  . Circumcision      Social History   Social History  . Marital Status: Single    Spouse Name: N/A  . Number of Children: N/A  . Years of Education: N/A   Occupational History  . Not on file.   Social History Main Topics  . Smoking status: Light Tobacco Smoker -- 0.50 packs/day    Types: Cigarettes  . Smokeless tobacco: Never Used     Comment: STATES HE SMOKES 2-3 TIMES MONTHLY  . Alcohol Use: Yes     Comment: A couple beers every few days   . Drug Use: Yes    Special: Marijuana, Cocaine     Comment: last used 11/03/13  . 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 Not on file   Social History Narrative   Family History  Problem Relation Age of Onset  . Breast cancer Mother   . Hypertension Mother   . Diabetes Father   . Hypertension Sister   . Heart disease Sister   . Hypertension Paternal Grandmother   . Colon cancer Neg Hx       VITAL SIGNS BP 110/68 mmHg  Pulse 74  Temp(Src) 98 F (36.7 C) (Oral)  Resp 18  Ht 5\' 6"  (1.676 m)  Wt 191 lb 4 oz (86.75 kg)  BMI 30.88 kg/m2  SpO2 98%  Patient's Medications  New Prescriptions   No medications on file  Previous Medications   FOLIC ACID (FOLVITE) 1  MG TABLET    Take 1 tablet (1 mg total) by mouth daily.   FUROSEMIDE (LASIX) 40 MG TABLET    Take 1 tablet (40 mg total) by mouth 2 (two) times daily.   KLOR-CON M20 20 MEQ TABLET    Take 20 mEq by mouth 2 (two) times daily.   LACTULOSE (CHRONULAC) 10 GM/15ML SOLUTION    Take 45 mLs (30 g total) by mouth 3 (three) times daily.   MAGNESIUM OXIDE (MAG-OX) 400 (241.3 MG) MG TABLET    Take 1 tablet (400 mg total) by mouth daily.   MULTIPLE VITAMIN (MULTIVITAMIN) TABLET    Take 1 tablet by mouth daily.   OMEPRAZOLE (PRILOSEC) 20 MG CAPSULE    Take 20 mg by mouth daily.   PANTOPRAZOLE (PROTONIX) 40 MG TABLET    Take 1 tablet (40 mg total) by mouth daily.   SPIRONOLACTONE (ALDACTONE) 100 MG TABLET    Take 0.5 tablets (50 mg total) by mouth daily.   THIAMINE 100 MG TABLET    Take 1 tablet (100 mg total)  by mouth daily.  Modified Medications   No medications on file  Discontinued Medications   CEPHALEXIN (KEFLEX) 500 MG CAPSULE    Take 1 capsule (500 mg total) by mouth 3 (three) times daily.     SIGNIFICANT DIAGNOSTIC EXAMS       ASSESSMENT/ PLAN:    Ok Edwards NP [facility]  Contact [phone] Monday through Friday 8am- 5pm  After hours call [phone]   This encounter was created in error - please disregard.

## 2015-09-26 NOTE — Progress Notes (Signed)
Patient ID: Jose [name], male   DOB: June 04, 1955, 60 y.o.   MRN: [id number]   Location:  [place] Room Number: 143-B Place of Service:  SNF (31)   CODE STATUS: Full Code  No Known Allergies  Chief Complaint  Patient presents with  . Acute Visit    HPI:  His ammonia level is 187. He does complain of fatigue present. He states otherwise he is feeling pretty good. He is awaiting his follow up with his GI in [place] to begin treatment for his hepatitis C.   Past Medical History  Diagnosis Date  . Hypertension   . Hepatitis C   . Alcohol abuse   . Ascites   . Cirrhosis ([place])   . Shortness of breath     with exertion  . Depression   . Alcoholism ([place]) 07/12/2015    Past Surgical History  Procedure Laterality Date  . Colonoscopy  march 2015    Dr. Renee Harder: nodular mucosa at appendiceal orifice, tubular adenoma, extremely poor prep  . Circumcision      Social History   Social History  . Marital Status: Single    Spouse Name: N/A  . Number of Children: N/A  . Years of Education: N/A   Occupational History  . Not on file.   Social History Main Topics  . Smoking status: Light Tobacco Smoker -- 0.50 packs/day    Types: Cigarettes  . Smokeless tobacco: Never Used     Comment: STATES HE SMOKES 2-3 TIMES MONTHLY  . Alcohol Use: Yes     Comment: A couple beers every few days   . Drug Use: Yes    Special: Marijuana, Cocaine     Comment: last used 11/03/13  . 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 Not on file   Social History Narrative   Family History  Problem Relation Age of Onset  . Breast cancer Mother   . Hypertension Mother   . Diabetes Father   . Hypertension Sister   . Heart disease Sister   . Hypertension Paternal Grandmother   . Colon cancer Neg Hx       VITAL SIGNS BP 110/68 mmHg  Pulse 74  Temp(Src) 98 F (36.7 C) (Oral)  Resp 18  Ht 5'  6" (1.676 m)  Wt 191 lb 4 oz (86.75 kg)  BMI 30.88 kg/m2  SpO2 98%  Patient's Medications  New Prescriptions   No medications on file  Previous Medications   FOLIC ACID (FOLVITE) 1 MG TABLET    Take 1 tablet (1 mg total) by mouth daily.   FUROSEMIDE (LASIX) 40 MG TABLET    Take 1 tablet (40 mg total) by mouth 2 (two) times daily.   KLOR-CON M20 20 MEQ TABLET    Take 20 mEq by mouth 2 (two) times daily.   LACTULOSE (CHRONULAC) 10 GM/15ML SOLUTION    Take 45 mLs (30 g total) by mouth 3 (three) times daily.   MAGNESIUM OXIDE (MAG-OX) 400 (241.3 MG) MG TABLET    Take 1 tablet (400 mg total) by mouth daily.   MULTIPLE VITAMIN (MULTIVITAMIN) TABLET    Take 1 tablet by mouth daily.   OMEPRAZOLE (PRILOSEC) 20 MG CAPSULE    Take 20 mg by mouth daily.   PANTOPRAZOLE (PROTONIX) 40 MG TABLET    Take 1 tablet (40 mg total) by mouth daily.   SPIRONOLACTONE (  ALDACTONE) 100 MG TABLET    Take 0.5 tablets (50 mg total) by mouth daily.   THIAMINE 100 MG TABLET    Take 1 tablet (100 mg total) by mouth daily.  Modified Medications   No medications on file  Discontinued Medications   No medications on file     SIGNIFICANT DIAGNOSTIC EXAMS  09-04-15: ct of head: No acute intracranial findings. There are chronic appearing white matter hypodensities which likely represent small vessel ischemic disease.   09-04-15; abdominal ultrasound: 1. Heterogeneously increased hepatic echotexture with irregularity of the hepatic contour consistent with known cirrhosis. Solid-appearing right lobe mass measuring 3 cm in greatest dimension. Further evaluation with hepatic protocol MRI is recommended. 2. Gallstones without sonographic evidence of acute cholecystitis. There is mild gallbladder wall thickening which has been observed in the past. 3. Chronic mild right-sided hydronephrosis.   09-05-15: mri of abdomen with mrcp: 1. Limited examination due to timing of the contrast administration and lack of precontrast LAVA  sequence. There is a T1 bright 2.8 cm lesion in segment 7 of the liver. Contrast enhancement is difficult to assess but this is considered a hepatoma. Recommend ultrasound-guided biopsy. 2. Advanced cirrhotic changes with portal venous hypertension, portal venous collaterals and esophageal varices. 3. Suspect iron deposition in liver and pancreas. 4. Gallstones, gallbladder wall thickening and pericholecystic fluid.  09-06-15: 2-d echo:  Impressions: - Vigorous LV systolic function; grade 1 diastolic dysfunction; calcified aortic valve; elevated mean gradient of 17 mmHg suggests mild AS but visually valve opens well; some of elevated gradient likely related to vigorous LV function; mild AI; trace MR; moderate LAE; mild RAE; trace TR with mildly elevated pulmonary pressure. - Left ventricle: The cavity size was normal. Wall thickness was increased in a pattern of mild LVH. Systolic function was vigorous. The estimated ejection fraction was in the range of 65% to 70%. Wall motion was normal; there were no regional wall motion abnormalities. Doppler parameters are consistent with abnormal left ventricular relaxation (grade 1 diastolic dysfunction). - Aortic valve: There was mild regurgitation. - Mitral valve: Calcified annulus. - Left atrium: The atrium was moderately dilated. - Right atrium: The atrium was mildly dilated. - Pulmonary arteries: Systolic pressure was mildly increased. PA   peak pressure: 33 mm Hg (S).    LABS REVIEWED:   09-04-15: ammonia: 142 09-05-15: ammonia 47; AFP tumor marker: 34.4 09-06-15: wbc 3.6; hgb 11.4; hct 32.5 ;mcv 102.8; plt 41; glucose 119; bun 15; creat 1.02; k+ 4.1; na++ 130; ast 112; alt 61; alk phos 143; total bili 1.8; albumin 2.0; vit B 12: 1377; folate 20.8; iron 157; tibc: 188 09-12-15: wbc 4.1; hgb 12.2; hct 35.1; mcv 102.9; plt 50; glucose 117; bun 9; creat 0.91; k+ 4.1; na++ 133; ast 109; alt 57; alk phos 151; total bili 2.0; albumin 2.6; ammonia 138 09-17-15;  ammonia 187  Review of Systems  Constitutional: Positive for malaise/fatigue.  Respiratory: Negative for cough and shortness of breath.   Cardiovascular: Negative for chest pain, palpitations and leg swelling.  Gastrointestinal: Negative for heartburn, abdominal pain and constipation.  Musculoskeletal: Negative for myalgias, back pain and joint pain.  Skin: Negative.   Neurological: Negative for dizziness.  Psychiatric/Behavioral: The patient is not nervous/anxious.     Physical Exam  Constitutional: No distress.  Eyes: Conjunctivae are normal. Scleral icterus is present.  Neck: Neck supple. No JVD present. No thyromegaly present.  Cardiovascular: Normal rate, regular rhythm and intact distal pulses.   Murmur heard. 2/6  Respiratory: Effort  normal and breath sounds normal. No respiratory distress. He has no wheezes.  GI: Soft. Bowel sounds are normal. He exhibits no distension. There is tenderness.  Ascites present hepatomegaly   Musculoskeletal: He exhibits no edema.  Able to move all extremities   Lymphadenopathy:    He has no cervical adenopathy.  Neurological: He is alert.  Skin: Skin is warm and dry. He is not diaphoretic.  Psychiatric: He has a normal mood and affect.      ASSESSMENT/ PLAN:  1. Hepatic encephalopathy: his ammonia level is 187; is presently taking lactulose 45 cc three times daily; will begin xifaxafin 550 mg twice daily and will repeat ammonia in 2 weeks.     Ok Edwards NP [facility]  Contact [phone] Monday through Friday 8am- 5pm  After hours call [phone]

## 2015-10-09 ENCOUNTER — Encounter: Payer: Self-pay | Admitting: Internal Medicine

## 2015-10-09 NOTE — Progress Notes (Signed)
DATE:  Location:  [place] Room Number: [place] of Service: [id number] [phone])   Extended Emergency Contact Information Primary Emergency Contact: [place], [place] [address] [place] Phone: [phone] Mobile Phone: [phone] Relation: Sister Secondary Emergency Contact: Sela H[place], [place] [address] [place] Phone: [phone] Relation: Other  Advanced Directive information Does patient have an advance directive?: No, Would patient like information on creating an advanced directive?: No - patient declined information  Chief Complaint  Patient presents with  . Medical Management of Chronic Issues    Routine Visit    HPI:    Past Medical History  Diagnosis Date  . Hypertension   . Hepatitis C   . Alcohol abuse   . Ascites   . Cirrhosis ([place])   . Shortness of breath     with exertion  . Depression   . Alcoholism ([place]) 07/12/2015    Past Surgical History  Procedure Laterality Date  . Colonoscopy  march 2015    Dr. Renee Harder: nodular mucosa at appendiceal orifice, tubular adenoma, extremely poor prep  . Circumcision      Patient Care Team: Marliss Coots, NP as PCP - General Langley Gauss, MD as Consulting Physician (Gastroenterology) [name], MD as Attending Physician (Internal Medicine)  Social History   Social History  . Marital Status: Single    Spouse Name: N/A  . Number of Children: N/A  . Years of Education: N/A   Occupational History  . Not on file.   Social History Main Topics  . Smoking status: Light Tobacco Smoker -- 0.50 packs/day    Types: Cigarettes  . Smokeless tobacco: Never Used     Comment: STATES HE SMOKES 2-3 TIMES MONTHLY  . Alcohol Use: Yes     Comment: A couple beers every few days   . Drug Use: Yes    Special: Marijuana, Cocaine     Comment: last used 11/03/13  . 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 Not on file   Social History Narrative     reports that he has been smoking Cigarettes.  He has been smoking about 0.50 packs per day. He has never used smokeless tobacco. He reports that he drinks alcohol. He reports that he uses illicit drugs (Marijuana and Cocaine).  Family History  Problem Relation Age of Onset  . Breast cancer Mother   . Hypertension Mother   . Diabetes Father   . Hypertension Sister   . Heart disease Sister   . Hypertension Paternal Grandmother   . Colon cancer Neg Hx    No family status information on file.    Immunization History  Administered Date(s) Administered  . Influenza,inj,Quad PF,36+ Mos 04/27/2013    No Known Allergies  Medications: Patient's Medications  New Prescriptions   No medications on file  Previous Medications   FOLIC ACID (FOLVITE) 1 MG TABLET    Take 1 tablet (1 mg total) by mouth daily.   FUROSEMIDE (LASIX) 40 MG TABLET    Take 1 tablet (40 mg total) by mouth 2 (two) times daily.   KLOR-CON M20 20 MEQ TABLET    Take 20 mEq by mouth 2 (two) times daily.   LACTULOSE (CHRONULAC) 10 GM/15ML SOLUTION  Take 45 mLs (30 g total) by mouth 3 (three) times daily.   MAGNESIUM OXIDE (MAG-OX) 400 (241.3 MG) MG TABLET    Take 1 tablet (400 mg total) by mouth daily.   MULTIPLE VITAMIN (MULTIVITAMIN) TABLET    Take 1 tablet by mouth daily.   OMEPRAZOLE (PRILOSEC) 20 MG CAPSULE    Take 20 mg by mouth daily.   RIFAXIMIN (XIFAXAN) 550 MG TABS TABLET    Take 1 tablet (550 mg total) by mouth 2 (two) times daily.   SPIRONOLACTONE (ALDACTONE) 100 MG TABLET    Take 0.5 tablets (50 mg total) by mouth daily.   THIAMINE 100 MG TABLET    Take 1 tablet (100 mg total) by mouth daily.  Modified Medications   No medications on file  Discontinued Medications   PANTOPRAZOLE (PROTONIX) 40 MG TABLET    Take 1 tablet (40 mg total) by mouth daily.    Review of Systems  Filed  Vitals:   10/09/15 1040  BP: 132/68  Pulse: 78  Temp: 98 F (36.7 C)  TempSrc: Oral  Resp: 18  Height: _0  (1.676 m)  Weight: 197 lb 3.2 oz (89.449 kg)  SpO2: 96%   Body mass index is 31.84 kg/(m^2).  Physical Exam   Labs reviewed: Abstract on 09/26/2015  Component Date Value Ref Range Status  . Ammonia 09/17/2015 187   Final  Erroneous Encounter on 09/26/2015  Component Date Value Ref Range Status  . Hemoglobin 09/12/2015 12.2* 13.5 - 17.5 g/dL Final  . HCT 09/12/2015 35* 41 - 53 % Final  . Platelets 09/12/2015 50* 150 - 399 K/L Final  . WBC 09/12/2015 4.1   Final  . Glucose 09/12/2015 117   Final  . BUN 09/12/2015 9  4 - 21 mg/dL Final  . Creatinine 09/12/2015 0.9  0.6 - 1.3 mg/dL Final  . Potassium 09/12/2015 4.1  3.4 - 5.3 mmol/L Final  . Sodium 09/12/2015 133* 137 - 147 mmol/L Final  . Alkaline Phosphatase 09/12/2015 151* 25 - 125 U/L Final  . ALT 09/12/2015 57* 10 - 40 U/L Final  . AST 09/12/2015 100* 14 - 40 U/L Final   109  . Bilirubin, Total 09/12/2015 2.0   Final  . Ammonia 09/12/2015 138   Final  . Albumin 09/12/2015 2.6   Final  . Calcium 09/12/2015 7.8   Final  Nursing Home on 09/11/2015  Component Date Value Ref Range Status  . WBC 09/07/2015 3.2   Final  . Glucose 09/07/2015 119   Final  . BUN 09/07/2015 15  4 - 21 mg/dL Final  . Creatinine 09/07/2015 1.0  0.6 - 1.3 mg/dL Final  . Sodium 09/07/2015 130* 137 - 147 mmol/L Final  . Bilirubin, Total 09/07/2015 1.8   Final  Admission on 09/03/2015, Discharged on 09/07/2015  Component Date Value Ref Range Status  . Lipase 09/03/2015 60* 11 - 51 U/L Final  . Sodium 09/03/2015 134* 135 - 145 mmol/L Final  . Potassium 09/03/2015 4.2  3.5 - 5.1 mmol/L Final  . Chloride 09/03/2015 103  101 - 111 mmol/L Final  . CO2 09/03/2015 19* 22 - 32 mmol/L Final  . Glucose, Bld 09/03/2015 164* 65 - 99 mg/dL Final  . BUN 09/03/2015 27* 6 - 20 mg/dL Final  . Creatinine, Ser 09/03/2015 1.59* 0.61 - 1.24 mg/dL Final    . Calcium 09/03/2015 9.2  8.9 - 10.3 mg/dL Final  . Total Protein 09/03/2015 8.4* 6.5 - 8.1 g/dL Final  . Albumin  09/03/2015 2.7* 3.5 - 5.0 g/dL Final  . AST 09/03/2015 147* 15 - 41 U/L Final  . ALT 09/03/2015 79* 17 - 63 U/L Final  . Alkaline Phosphatase 09/03/2015 182* 38 - 126 U/L Final  . Total Bilirubin 09/03/2015 3.5* 0.3 - 1.2 mg/dL Final  . GFR calc non Af Amer 09/03/2015 46* >60 mL/min Final  . GFR calc Af Amer 09/03/2015 53* >60 mL/min Final   Comment: (NOTE) The eGFR has been calculated using the CKD EPI equation. This calculation has not been validated in all clinical situations. eGFR's persistently <60 mL/min signify possible Chronic Kidney Disease.   . Anion gap 09/03/2015 12  5 - 15 Final  . WBC 09/03/2015 4.6  4.0 - 10.5 K/uL Final  . RBC 09/03/2015 4.01* 4.22 - 5.81 MIL/uL Final  . Hemoglobin 09/03/2015 14.1  13.0 - 17.0 g/dL Final  . HCT 09/03/2015 40.6  39.0 - 52.0 % Final  . MCV 09/03/2015 101.2* 78.0 - 100.0 fL Final  . MCH 09/03/2015 35.2* 26.0 - 34.0 pg Final  . MCHC 09/03/2015 34.7  30.0 - 36.0 g/dL Final  . RDW 09/03/2015 13.6  11.5 - 15.5 % Final  . Platelets 09/03/2015 65* 150 - 400 K/uL Final   CONSISTENT WITH PREVIOUS RESULT  . Color, Urine 09/03/2015 ORANGE* YELLOW Final   BIOCHEMICALS MAY BE AFFECTED BY COLOR  . APPearance 09/03/2015 CLOUDY* CLEAR Final  . Specific Gravity, Urine 09/03/2015 1.025  1.005 - 1.030 Final  . pH 09/03/2015 5.5  5.0 - 8.0 Final  . Glucose, UA 09/03/2015 NEGATIVE  NEGATIVE mg/dL Final  . Hgb urine dipstick 09/03/2015 MODERATE* NEGATIVE Final  . Bilirubin Urine 09/03/2015 SMALL* NEGATIVE Final  . Ketones, ur 09/03/2015 15* NEGATIVE mg/dL Final  . Protein, ur 09/03/2015 NEGATIVE  NEGATIVE mg/dL Final  . Nitrite 09/03/2015 POSITIVE* NEGATIVE Final  . Leukocytes, UA 09/03/2015 SMALL* NEGATIVE Final  . Prothrombin Time 09/04/2015 16.2* 11.6 - 15.2 seconds Final  . INR 09/04/2015 1.29  0.00 - 1.49 Final  . Sodium, Ur  09/03/2015 43   Final  . Creatinine, Urine 09/03/2015 271.64   Final  . Lactic Acid, Venous 09/04/2015 2.7* 0.5 - 2.0 mmol/L Final   Comment: CRITICAL RESULT CALLED TO, READ BACK BY AND VERIFIED WITH: OLLIFON,J RN 09/04/2015 0043 JORDANS   . Troponin I 09/04/2015 0.05* <0.031 ng/mL Final   Comment:        PERSISTENTLY INCREASED TROPONIN VALUES IN THE RANGE OF 0.04-0.49 ng/mL CAN BE SEEN IN:       -UNSTABLE ANGINA       -CONGESTIVE HEART FAILURE       -MYOCARDITIS       -CHEST TRAUMA       -ARRYHTHMIAS       -LATE PRESENTING MYOCARDIAL INFARCTION       -COPD   CLINICAL FOLLOW-UP RECOMMENDED.   Marland Kitchen Opiates 09/03/2015 NONE DETECTED  NONE DETECTED Final  . Cocaine 09/03/2015 NONE DETECTED  NONE DETECTED Final  . Benzodiazepines 09/03/2015 NONE DETECTED  NONE DETECTED Final  . Amphetamines 09/03/2015 NONE DETECTED  NONE DETECTED Final  . Tetrahydrocannabinol 09/03/2015 NONE DETECTED  NONE DETECTED Final  . Barbiturates 09/03/2015 NONE DETECTED  NONE DETECTED Final   Comment:        DRUG SCREEN FOR MEDICAL PURPOSES ONLY.  IF CONFIRMATION IS NEEDED FOR ANY PURPOSE, NOTIFY LAB WITHIN 5 DAYS.        LOWEST DETECTABLE LIMITS FOR URINE DRUG SCREEN Drug Class  Cutoff (ng/mL) Amphetamine      1000 Barbiturate      200 Benzodiazepine   903 Tricyclics       009 Opiates          300 Cocaine          300 THC              50   . Squamous Epithelial / LPF 09/03/2015 6-30* NONE SEEN Final  . WBC, UA 09/03/2015 0-5  0 - 5 WBC/hpf Final  . RBC / HPF 09/03/2015 6-30  0 - 5 RBC/hpf Final  . Bacteria, UA 09/03/2015 MANY* NONE SEEN Final  . Casts 09/03/2015 HYALINE CASTS* NEGATIVE Final  . Ammonia 09/04/2015 142* 9 - 35 umol/L Final  . Sodium 09/04/2015 134* 135 - 145 mmol/L Final  . Potassium 09/04/2015 3.9  3.5 - 5.1 mmol/L Final  . Chloride 09/04/2015 102  101 - 111 mmol/L Final  . CO2 09/04/2015 19* 22 - 32 mmol/L Final  . Glucose, Bld 09/04/2015 155* 65 - 99 mg/dL Final  . BUN  09/04/2015 26* 6 - 20 mg/dL Final  . Creatinine, Ser 09/04/2015 1.40* 0.61 - 1.24 mg/dL Final  . Calcium 09/04/2015 8.5* 8.9 - 10.3 mg/dL Final  . GFR calc non Af Amer 09/04/2015 54* >60 mL/min Final  . GFR calc Af Amer 09/04/2015 >60  >60 mL/min Final   Comment: (NOTE) The eGFR has been calculated using the CKD EPI equation. This calculation has not been validated in all clinical situations. eGFR's persistently <60 mL/min signify possible Chronic Kidney Disease.   . Anion gap 09/04/2015 13  5 - 15 Final  . WBC 09/04/2015 4.8  4.0 - 10.5 K/uL Final  . RBC 09/04/2015 3.79* 4.22 - 5.81 MIL/uL Final  . Hemoglobin 09/04/2015 13.5  13.0 - 17.0 g/dL Final  . HCT 09/04/2015 38.8* 39.0 - 52.0 % Final  . MCV 09/04/2015 102.4* 78.0 - 100.0 fL Final  . MCH 09/04/2015 35.6* 26.0 - 34.0 pg Final  . MCHC 09/04/2015 34.8  30.0 - 36.0 g/dL Final  . RDW 09/04/2015 13.7  11.5 - 15.5 % Final  . Platelets 09/04/2015 44* 150 - 400 K/uL Final   CONSISTENT WITH PREVIOUS RESULT  . Total Protein 09/04/2015 7.3  6.5 - 8.1 g/dL Final  . Albumin 09/04/2015 2.4* 3.5 - 5.0 g/dL Final  . AST 09/04/2015 132* 15 - 41 U/L Final  . ALT 09/04/2015 70* 17 - 63 U/L Final  . Alkaline Phosphatase 09/04/2015 137* 38 - 126 U/L Final  . Total Bilirubin 09/04/2015 3.3* 0.3 - 1.2 mg/dL Final  . Bilirubin, Direct 09/04/2015 1.2* 0.1 - 0.5 mg/dL Final  . Indirect Bilirubin 09/04/2015 2.1* 0.3 - 0.9 mg/dL Final  . Troponin I 09/04/2015 0.05* <0.031 ng/mL Final   Comment:        PERSISTENTLY INCREASED TROPONIN VALUES IN THE RANGE OF 0.04-0.49 ng/mL CAN BE SEEN IN:       -UNSTABLE ANGINA       -CONGESTIVE HEART FAILURE       -MYOCARDITIS       -CHEST TRAUMA       -ARRYHTHMIAS       -LATE PRESENTING MYOCARDIAL INFARCTION       -COPD   CLINICAL FOLLOW-UP RECOMMENDED.   Marland Kitchen Troponin I 09/04/2015 0.06* <0.031 ng/mL Final   Comment:        PERSISTENTLY INCREASED TROPONIN VALUES IN THE RANGE OF 0.04-0.49 ng/mL CAN BE SEEN  IN:       -  UNSTABLE ANGINA       -CONGESTIVE HEART FAILURE       -MYOCARDITIS       -CHEST TRAUMA       -ARRYHTHMIAS       -LATE PRESENTING MYOCARDIAL INFARCTION       -COPD   CLINICAL FOLLOW-UP RECOMMENDED.   Marland Kitchen Troponin I 09/04/2015 0.05* <0.031 ng/mL Final   Comment:        PERSISTENTLY INCREASED TROPONIN VALUES IN THE RANGE OF 0.04-0.49 ng/mL CAN BE SEEN IN:       -UNSTABLE ANGINA       -CONGESTIVE HEART FAILURE       -MYOCARDITIS       -CHEST TRAUMA       -ARRYHTHMIAS       -LATE PRESENTING MYOCARDIAL INFARCTION       -COPD   CLINICAL FOLLOW-UP RECOMMENDED.   Marland Kitchen Lactic Acid, Venous 09/04/2015 2.3* 0.5 - 2.0 mmol/L Final   Comment: CRITICAL RESULT CALLED TO, READ BACK BY AND VERIFIED WITH: THACKER,J RN 09/04/2015 0434 JORDANS   . Lactic Acid, Venous 09/04/2015 1.6  0.5 - 2.0 mmol/L Final  . Magnesium 09/04/2015 2.2  1.7 - 2.4 mg/dL Final  . Sodium 09/05/2015 133* 135 - 145 mmol/L Final  . Potassium 09/05/2015 4.3  3.5 - 5.1 mmol/L Final  . Chloride 09/05/2015 102  101 - 111 mmol/L Final  . CO2 09/05/2015 21* 22 - 32 mmol/L Final  . Glucose, Bld 09/05/2015 106* 65 - 99 mg/dL Final  . BUN 09/05/2015 16  6 - 20 mg/dL Final  . Creatinine, Ser 09/05/2015 0.99  0.61 - 1.24 mg/dL Final  . Calcium 09/05/2015 8.4* 8.9 - 10.3 mg/dL Final  . Total Protein 09/05/2015 6.8  6.5 - 8.1 g/dL Final  . Albumin 09/05/2015 2.1* 3.5 - 5.0 g/dL Final  . AST 09/05/2015 116* 15 - 41 U/L Final  . ALT 09/05/2015 66* 17 - 63 U/L Final  . Alkaline Phosphatase 09/05/2015 138* 38 - 126 U/L Final  . Total Bilirubin 09/05/2015 2.1* 0.3 - 1.2 mg/dL Final  . GFR calc non Af Amer 09/05/2015 >60  >60 mL/min Final  . GFR calc Af Amer 09/05/2015 >60  >60 mL/min Final   Comment: (NOTE) The eGFR has been calculated using the CKD EPI equation. This calculation has not been validated in all clinical situations. eGFR's persistently <60 mL/min signify possible Chronic Kidney Disease.   . Anion gap  09/05/2015 10  5 - 15 Final  . WBC 09/05/2015 4.1  4.0 - 10.5 K/uL Final  . RBC 09/05/2015 3.36* 4.22 - 5.81 MIL/uL Final  . Hemoglobin 09/05/2015 12.1* 13.0 - 17.0 g/dL Final   REPEATED TO VERIFY  . HCT 09/05/2015 34.8* 39.0 - 52.0 % Final  . MCV 09/05/2015 103.6* 78.0 - 100.0 fL Final  . MCH 09/05/2015 36.0* 26.0 - 34.0 pg Final  . MCHC 09/05/2015 34.8  30.0 - 36.0 g/dL Final  . RDW 09/05/2015 13.5  11.5 - 15.5 % Final  . Platelets 09/05/2015 46* 150 - 400 K/uL Final   Comment: SPECIMEN CHECKED FOR CLOTS REPEATED TO VERIFY   . Ammonia 09/05/2015 47* 9 - 35 umol/L Final  . AFP-Tumor Marker 09/05/2015 34.4* 0.0 - 8.3 ng/mL Final   Comment: (NOTE) Roche ECLIA methodology Performed At: [facility] [place], [place] [id number] Lindon Romp MD IR:[id number]   . Weight 09/06/2015 2832.47   Final  . BP 09/06/2015 120/57   Final  . ABO/RH(D)  09/05/2015 O POS   Final  . Antibody Screen 09/05/2015 NEG   Final  . Sample Expiration 09/05/2015 09/08/2015   Final  . ABO/RH(D) 09/05/2015 O POS   Final  . WBC 09/06/2015 3.2* 4.0 - 10.5 K/uL Final  . RBC 09/06/2015 2.86* 4.22 - 5.81 MIL/uL Final  . Hemoglobin 09/06/2015 10.4* 13.0 - 17.0 g/dL Final  . HCT 09/06/2015 29.5* 39.0 - 52.0 % Final  . MCV 09/06/2015 103.1* 78.0 - 100.0 fL Final  . MCH 09/06/2015 36.4* 26.0 - 34.0 pg Final  . MCHC 09/06/2015 35.3  30.0 - 36.0 g/dL Final  . RDW 09/06/2015 13.3  11.5 - 15.5 % Final  . Platelets 09/06/2015 36* 150 - 400 K/uL Final   Comment: REPEATED TO VERIFY SPECIMEN CHECKED FOR CLOTS CONSISTENT WITH PREVIOUS RESULT   . Sodium 09/06/2015 130* 135 - 145 mmol/L Final  . Potassium 09/06/2015 4.1  3.5 - 5.1 mmol/L Final  . Chloride 09/06/2015 102  101 - 111 mmol/L Final  . CO2 09/06/2015 19* 22 - 32 mmol/L Final  . Glucose, Bld 09/06/2015 119* 65 - 99 mg/dL Final  . BUN 09/06/2015 15  6 - 20 mg/dL Final  . Creatinine, Ser 09/06/2015 1.02  0.61 - 1.24 mg/dL Final  .  Calcium 09/06/2015 8.2* 8.9 - 10.3 mg/dL Final  . Total Protein 09/06/2015 6.3* 6.5 - 8.1 g/dL Final  . Albumin 09/06/2015 2.0* 3.5 - 5.0 g/dL Final  . AST 09/06/2015 112* 15 - 41 U/L Final  . ALT 09/06/2015 61  17 - 63 U/L Final  . Alkaline Phosphatase 09/06/2015 143* 38 - 126 U/L Final  . Total Bilirubin 09/06/2015 1.8* 0.3 - 1.2 mg/dL Final  . GFR calc non Af Amer 09/06/2015 >60  >60 mL/min Final  . GFR calc Af Amer 09/06/2015 >60  >60 mL/min Final   Comment: (NOTE) The eGFR has been calculated using the CKD EPI equation. This calculation has not been validated in all clinical situations. eGFR's persistently <60 mL/min signify possible Chronic Kidney Disease.   . Anion gap 09/06/2015 9  5 - 15 Final  . Unit Number 09/06/2015 [id number]   Final  . Blood Component Type 09/06/2015 PLTPHER LR1   Final  . Unit division 09/06/2015 00   Final  . Status of Unit 09/06/2015 ISSUED,FINAL   Final  . Transfusion Status 09/06/2015 OK TO TRANSFUSE   Final  . WBC 09/06/2015 3.6* 4.0 - 10.5 K/uL Final  . RBC 09/06/2015 3.16* 4.22 - 5.81 MIL/uL Final  . Hemoglobin 09/06/2015 11.4* 13.0 - 17.0 g/dL Final  . HCT 09/06/2015 32.5* 39.0 - 52.0 % Final  . MCV 09/06/2015 102.8* 78.0 - 100.0 fL Final  . MCH 09/06/2015 36.1* 26.0 - 34.0 pg Final  . MCHC 09/06/2015 35.1  30.0 - 36.0 g/dL Final  . RDW 09/06/2015 13.3  11.5 - 15.5 % Final  . Platelets 09/06/2015 41* 150 - 400 K/uL Final   Comment: CONSISTENT WITH PREVIOUS RESULT REPEATED TO VERIFY   . Vitamin B-12 09/06/2015 1377* 180 - 914 pg/mL Final   Comment: (NOTE) This assay is not validated for testing neonatal or myeloproliferative syndrome specimens for Vitamin B12 levels.   . Folate 09/06/2015 20.8  >5.9 ng/mL Final  . Iron 09/06/2015 157  45 - 182 ug/dL Final  . TIBC 09/06/2015 188* 250 - 450 ug/dL Final  . Saturation Ratios 09/06/2015 84* 17.9 - 39.5 % Final  . UIBC 09/06/2015 31   Final  . Ferritin 09/06/2015 1295*  24 - 336 ng/mL  Final  . Retic Ct Pct 09/06/2015 2.4  0.4 - 3.1 % Final  . RBC. 09/06/2015 3.16* 4.22 - 5.81 MIL/uL Final  . Retic Count, Manual 09/06/2015 75.8  19.0 - 186.0 K/uL Final  . WBC 09/07/2015 4.2  4.0 - 10.5 K/uL Final  . RBC 09/07/2015 2.77* 4.22 - 5.81 MIL/uL Final  . Hemoglobin 09/07/2015 10.0* 13.0 - 17.0 g/dL Final  . HCT 09/07/2015 28.3* 39.0 - 52.0 % Final  . MCV 09/07/2015 102.2* 78.0 - 100.0 fL Final  . MCH 09/07/2015 36.1* 26.0 - 34.0 pg Final  . MCHC 09/07/2015 35.3  30.0 - 36.0 g/dL Final  . RDW 09/07/2015 13.2  11.5 - 15.5 % Final  . Platelets 09/07/2015 49* 150 - 400 K/uL Final   CONSISTENT WITH PREVIOUS RESULT  . Sodium 09/07/2015 130* 135 - 145 mmol/L Final  . Potassium 09/07/2015 3.6  3.5 - 5.1 mmol/L Final  . Chloride 09/07/2015 102  101 - 111 mmol/L Final  . CO2 09/07/2015 20* 22 - 32 mmol/L Final  . Glucose, Bld 09/07/2015 186* 65 - 99 mg/dL Final  . BUN 09/07/2015 13  6 - 20 mg/dL Final  . Creatinine, Ser 09/07/2015 0.90  0.61 - 1.24 mg/dL Final  . Calcium 09/07/2015 8.0* 8.9 - 10.3 mg/dL Final  . Total Protein 09/07/2015 5.9* 6.5 - 8.1 g/dL Final  . Albumin 09/07/2015 1.9* 3.5 - 5.0 g/dL Final  . AST 09/07/2015 103* 15 - 41 U/L Final  . ALT 09/07/2015 56  17 - 63 U/L Final  . Alkaline Phosphatase 09/07/2015 142* 38 - 126 U/L Final  . Total Bilirubin 09/07/2015 2.1* 0.3 - 1.2 mg/dL Final  . GFR calc non Af Amer 09/07/2015 >60  >60 mL/min Final  . GFR calc Af Amer 09/07/2015 >60  >60 mL/min Final   Comment: (NOTE) The eGFR has been calculated using the CKD EPI equation. This calculation has not been validated in all clinical situations. eGFR's persistently <60 mL/min signify possible Chronic Kidney Disease.   . Anion gap 09/07/2015 8  5 - 15 Final  Admission on 07/23/2015, Discharged on 07/29/2015  Component Date Value Ref Range Status  . Glucose-Capillary 07/23/2015 203* 65 - 99 mg/dL Final  . Comment 1 07/23/2015 Notify RN   Final  . WBC 07/23/2015 3.3* 4.0  - 10.5 K/uL Final  . RBC 07/23/2015 3.36* 4.22 - 5.81 MIL/uL Final  . Hemoglobin 07/23/2015 12.0* 13.0 - 17.0 g/dL Final  . HCT 07/23/2015 35.0* 39.0 - 52.0 % Final  . MCV 07/23/2015 104.2* 78.0 - 100.0 fL Final  . MCH 07/23/2015 35.7* 26.0 - 34.0 pg Final  . MCHC 07/23/2015 34.3  30.0 - 36.0 g/dL Final  . RDW 07/23/2015 14.0  11.5 - 15.5 % Final  . Platelets 07/23/2015 52* 150 - 400 K/uL Final   CONSISTENT WITH PREVIOUS RESULT  . Neutrophils Relative % 07/23/2015 52   Final  . Neutro Abs 07/23/2015 1.7  1.7 - 7.7 K/uL Final  . Lymphocytes Relative 07/23/2015 26   Final  . Lymphs Abs 07/23/2015 0.9  0.7 - 4.0 K/uL Final  . Monocytes Relative 07/23/2015 18   Final  . Monocytes Absolute 07/23/2015 0.6  0.1 - 1.0 K/uL Final  . Eosinophils Relative 07/23/2015 3   Final  . Eosinophils Absolute 07/23/2015 0.1  0.0 - 0.7 K/uL Final  . Basophils Relative 07/23/2015 1   Final  . Basophils Absolute 07/23/2015 0.0  0.0 - 0.1 K/uL Final  .  Sodium 07/23/2015 138  135 - 145 mmol/L Final  . Potassium 07/23/2015 2.9* 3.5 - 5.1 mmol/L Final  . Chloride 07/23/2015 103  101 - 111 mmol/L Final  . CO2 07/23/2015 24  22 - 32 mmol/L Final  . Glucose, Bld 07/23/2015 138* 65 - 99 mg/dL Final  . BUN 07/23/2015 35* 6 - 20 mg/dL Final  . Creatinine, Ser 07/23/2015 1.88* 0.61 - 1.24 mg/dL Final  . Calcium 07/23/2015 8.1* 8.9 - 10.3 mg/dL Final  . Total Protein 07/23/2015 6.5  6.5 - 8.1 g/dL Final  . Albumin 07/23/2015 2.2* 3.5 - 5.0 g/dL Final  . AST 07/23/2015 124* 15 - 41 U/L Final  . ALT 07/23/2015 58  17 - 63 U/L Final  . Alkaline Phosphatase 07/23/2015 225* 38 - 126 U/L Final  . Total Bilirubin 07/23/2015 1.2  0.3 - 1.2 mg/dL Final  . GFR calc non Af Amer 07/23/2015 37* >60 mL/min Final  . GFR calc Af Amer 07/23/2015 43* >60 mL/min Final   Comment: (NOTE) The eGFR has been calculated using the CKD EPI equation. This calculation has not been validated in all clinical situations. eGFR's persistently <60  mL/min signify possible Chronic Kidney Disease.   . Anion gap 07/23/2015 11  5 - 15 Final  . Lactic Acid, Venous 07/23/2015 1.84  0.5 - 2.0 mmol/L Final  . Ammonia 07/23/2015 264* 9 - 35 umol/L Final  . Color, Urine 07/23/2015 AMBER* YELLOW Final   BIOCHEMICALS MAY BE AFFECTED BY COLOR  . APPearance 07/23/2015 CLEAR  CLEAR Final  . Specific Gravity, Urine 07/23/2015 1.016  1.005 - 1.030 Final  . pH 07/23/2015 6.0  5.0 - 8.0 Final  . Glucose, UA 07/23/2015 NEGATIVE  NEGATIVE mg/dL Final  . Hgb urine dipstick 07/23/2015 NEGATIVE  NEGATIVE Final  . Bilirubin Urine 07/23/2015 SMALL* NEGATIVE Final  . Ketones, ur 07/23/2015 NEGATIVE  NEGATIVE mg/dL Final  . Protein, ur 07/23/2015 NEGATIVE  NEGATIVE mg/dL Final  . Nitrite 07/23/2015 NEGATIVE  NEGATIVE Final  . Leukocytes, UA 07/23/2015 NEGATIVE  NEGATIVE Final   MICROSCOPIC NOT DONE ON URINES WITH NEGATIVE PROTEIN, BLOOD, LEUKOCYTES, NITRITE, OR GLUCOSE <1000 mg/dL.  [facility] Kitchen Specimen Description 07/23/2015 URINE, CATHETERIZED   Final  . Special Requests 07/23/2015 NONE   Final  . Culture 07/23/2015 NO GROWTH 1 DAY   Final  . Report Status 07/23/2015 07/25/2015 FINAL   Final  . Specimen Description 07/23/2015 BLOOD RIGHT ANTECUBITAL   Final  . Special Requests 07/23/2015 BOTTLES DRAWN AEROBIC AND ANAEROBIC 5CC   Final  . Culture 07/23/2015 NO GROWTH 5 DAYS   Final  . Report Status 07/23/2015 07/28/2015 FINAL   Final  . Specimen Description 07/23/2015 BLOOD LEFT ANTECUBITAL   Final  . Special Requests 07/23/2015 BOTTLES DRAWN AEROBIC AND ANAEROBIC 5CC   Final  . Culture 07/23/2015 NO GROWTH 5 DAYS   Final  . Report Status 07/23/2015 07/28/2015 FINAL   Final  . pH, Ven 07/23/2015 7.520* 7.250 - 7.300 Final  . pCO2, Ven 07/23/2015 29.9* 45.0 - 50.0 mmHg Final  . pO2, Ven 07/23/2015 39.0  31.0 - 45.0 mmHg Final  . Bicarbonate 07/23/2015 24.4* 20.0 - 24.0 mEq/L Final  . TCO2 07/23/2015 25  0 - 100 mmol/L Final  . O2 Saturation 07/23/2015 81.0    Final  . Acid-Base Excess 07/23/2015 2.0  0.0 - 2.0 mmol/L Final  . Patient temperature 07/23/2015 HIDE   Final  . Sample type 07/23/2015 VENOUS   Final  . Comment 07/23/2015 NOTIFIED  PHYSICIAN   Final  . Prothrombin Time 07/23/2015 16.2* 11.6 - 15.2 seconds Final  . INR 07/23/2015 1.29  0.00 - 1.49 Final  . Alcohol, Ethyl (B) 07/23/2015 <5  <5 mg/dL Final   Comment:        LOWEST DETECTABLE LIMIT FOR SERUM ALCOHOL IS 5 mg/dL FOR MEDICAL PURPOSES ONLY   . Opiates 07/23/2015 NONE DETECTED  NONE DETECTED Final  . Cocaine 07/23/2015 NONE DETECTED  NONE DETECTED Final  . Benzodiazepines 07/23/2015 NONE DETECTED  NONE DETECTED Final  . Amphetamines 07/23/2015 NONE DETECTED  NONE DETECTED Final  . Tetrahydrocannabinol 07/23/2015 NONE DETECTED  NONE DETECTED Final  . Barbiturates 07/23/2015 NONE DETECTED  NONE DETECTED Final   Comment:        DRUG SCREEN FOR MEDICAL PURPOSES ONLY.  IF CONFIRMATION IS NEEDED FOR ANY PURPOSE, NOTIFY LAB WITHIN 5 DAYS.        LOWEST DETECTABLE LIMITS FOR URINE DRUG SCREEN Drug Class       Cutoff (ng/mL) Amphetamine      1000 Barbiturate      200 Benzodiazepine   623 Tricyclics       762 Opiates          300 Cocaine          300 THC              50   . Troponin i, poc 07/23/2015 0.02  0.00 - 0.08 ng/mL Final  . Comment 3 07/23/2015          Final   Comment: Due to the release kinetics of cTnI, a negative result within the first hours of the onset of symptoms does not rule out myocardial infarction with certainty. If myocardial infarction is still suspected, repeat the test at appropriate intervals.   . Lactic Acid, Venous 07/23/2015 1.75  0.5 - 2.0 mmol/L Final  . Sodium 07/24/2015 141  135 - 145 mmol/L Final  . Potassium 07/24/2015 3.1* 3.5 - 5.1 mmol/L Final  . Chloride 07/24/2015 108  101 - 111 mmol/L Final  . CO2 07/24/2015 24  22 - 32 mmol/L Final  . Glucose, Bld 07/24/2015 121* 65 - 99 mg/dL Final  . BUN 07/24/2015 30* 6 - 20 mg/dL Final   . Creatinine, Ser 07/24/2015 1.41* 0.61 - 1.24 mg/dL Final  . Calcium 07/24/2015 8.2* 8.9 - 10.3 mg/dL Final  . Total Protein 07/24/2015 6.1* 6.5 - 8.1 g/dL Final  . Albumin 07/24/2015 1.9* 3.5 - 5.0 g/dL Final  . AST 07/24/2015 114* 15 - 41 U/L Final  . ALT 07/24/2015 56  17 - 63 U/L Final  . Alkaline Phosphatase 07/24/2015 189* 38 - 126 U/L Final  . Total Bilirubin 07/24/2015 1.7* 0.3 - 1.2 mg/dL Final  . GFR calc non Af Amer 07/24/2015 53* >60 mL/min Final  . GFR calc Af Amer 07/24/2015 >60  >60 mL/min Final   Comment: (NOTE) The eGFR has been calculated using the CKD EPI equation. This calculation has not been validated in all clinical situations. eGFR's persistently <60 mL/min signify possible Chronic Kidney Disease.   . Anion gap 07/24/2015 9  5 - 15 Final  . WBC 07/24/2015 3.3* 4.0 - 10.5 K/uL Final  . RBC 07/24/2015 3.38* 4.22 - 5.81 MIL/uL Final  . Hemoglobin 07/24/2015 11.9* 13.0 - 17.0 g/dL Final  . HCT 07/24/2015 35.3* 39.0 - 52.0 % Final  . MCV 07/24/2015 104.4* 78.0 - 100.0 fL Final  . MCH 07/24/2015 35.2* 26.0 - 34.0 pg Final  .  MCHC 07/24/2015 33.7  30.0 - 36.0 g/dL Final  . RDW 07/24/2015 14.0  11.5 - 15.5 % Final  . Platelets 07/24/2015 59* 150 - 400 K/uL Final   CONSISTENT WITH PREVIOUS RESULT  . Neutrophils Relative % 07/24/2015 55   Final  . Neutro Abs 07/24/2015 1.8  1.7 - 7.7 K/uL Final  . Lymphocytes Relative 07/24/2015 31   Final  . Lymphs Abs 07/24/2015 1.0  0.7 - 4.0 K/uL Final  . Monocytes Relative 07/24/2015 11   Final  . Monocytes Absolute 07/24/2015 0.4  0.1 - 1.0 K/uL Final  . Eosinophils Relative 07/24/2015 2   Final  . Eosinophils Absolute 07/24/2015 0.1  0.0 - 0.7 K/uL Final  . Basophils Relative 07/24/2015 1   Final  . Basophils Absolute 07/24/2015 0.0  0.0 - 0.1 K/uL Final  . Magnesium 07/24/2015 2.4  1.7 - 2.4 mg/dL Final  . Ammonia 07/24/2015 130* 9 - 35 umol/L Final  . Potassium 07/24/2015 3.1* 3.5 - 5.1 mmol/L Final  . B Natriuretic  Peptide 07/24/2015 39.8  0.0 - 100.0 pg/mL Final  . Procalcitonin 07/24/2015 0.23   Final   Comment:        Interpretation: PCT (Procalcitonin) <= 0.5 ng/mL: Systemic infection (sepsis) is not likely. Local bacterial infection is possible. (NOTE)         ICU PCT Algorithm               Non ICU PCT Algorithm    ----------------------------     ------------------------------         PCT < 0.25 ng/mL                 PCT < 0.1 ng/mL     Stopping of antibiotics            Stopping of antibiotics       strongly encouraged.               strongly encouraged.    ----------------------------     ------------------------------       PCT level decrease by               PCT < 0.25 ng/mL       >= 80% from peak PCT       OR PCT 0.25 - 0.5 ng/mL          Stopping of antibiotics                                             encouraged.     Stopping of antibiotics           encouraged.    ----------------------------     ------------------------------       PCT level decrease by              PCT >= 0.25 ng/mL       < 80% from peak PCT        AND PCT >= 0.5 ng/mL            Continuin                          g antibiotics  encouraged.       Continuing antibiotics            encouraged.    ----------------------------     ------------------------------     PCT level increase compared          PCT > 0.5 ng/mL         with peak PCT AND          PCT >= 0.5 ng/mL             Escalation of antibiotics                                          strongly encouraged.      Escalation of antibiotics        strongly encouraged.   . Lactic Acid, Venous 07/24/2015 1.5  0.5 - 2.0 mmol/L Final  . Glucose-Capillary 07/24/2015 141* 65 - 99 mg/dL Final  . MRSA by PCR 07/24/2015 NEGATIVE  NEGATIVE Final   Comment:        The GeneXpert MRSA Assay (FDA approved for NASAL specimens only), is one component of a comprehensive MRSA colonization surveillance program. It is  not intended to diagnose MRSA infection nor to guide or monitor treatment for MRSA infections.   . Glucose-Capillary 07/24/2015 171* 65 - 99 mg/dL Final  . Comment 1 07/24/2015 Notify RN   Final  . Glucose-Capillary 07/25/2015 123* 65 - 99 mg/dL Final  . Comment 1 07/25/2015 Venous Specimen   Final  . Sodium 07/26/2015 138  135 - 145 mmol/L Final  . Potassium 07/26/2015 3.1* 3.5 - 5.1 mmol/L Final  . Chloride 07/26/2015 108  101 - 111 mmol/L Final  . CO2 07/26/2015 23  22 - 32 mmol/L Final  . Glucose, Bld 07/26/2015 138* 65 - 99 mg/dL Final  . BUN 07/26/2015 11  6 - 20 mg/dL Final  . Creatinine, Ser 07/26/2015 1.01  0.61 - 1.24 mg/dL Final  . Calcium 07/26/2015 7.9* 8.9 - 10.3 mg/dL Final  . GFR calc non Af Amer 07/26/2015 >60  >60 mL/min Final  . GFR calc Af Amer 07/26/2015 >60  >60 mL/min Final   Comment: (NOTE) The eGFR has been calculated using the CKD EPI equation. This calculation has not been validated in all clinical situations. eGFR's persistently <60 mL/min signify possible Chronic Kidney Disease.   . Anion gap 07/26/2015 7  5 - 15 Final  . WBC 07/26/2015 3.4* 4.0 - 10.5 K/uL Final  . RBC 07/26/2015 3.03* 4.22 - 5.81 MIL/uL Final  . Hemoglobin 07/26/2015 10.7* 13.0 - 17.0 g/dL Final  . HCT 07/26/2015 31.6* 39.0 - 52.0 % Final  . MCV 07/26/2015 104.3* 78.0 - 100.0 fL Final  . MCH 07/26/2015 35.3* 26.0 - 34.0 pg Final  . MCHC 07/26/2015 33.9  30.0 - 36.0 g/dL Final  . RDW 07/26/2015 13.9  11.5 - 15.5 % Final  . Platelets 07/26/2015 47* 150 - 400 K/uL Final   CONSISTENT WITH PREVIOUS RESULT  . Glucose-Capillary 07/26/2015 134* 65 - 99 mg/dL Final  . Sodium 07/27/2015 140  135 - 145 mmol/L Final  . Potassium 07/27/2015 3.8  3.5 - 5.1 mmol/L Final   DELTA CHECK NOTED  . Chloride 07/27/2015 110  101 - 111 mmol/L Final  . CO2 07/27/2015 22  22 - 32 mmol/L Final  . Glucose, Bld 07/27/2015 117* 65 - 99 mg/dL Final  .  BUN 07/27/2015 14  6 - 20 mg/dL Final  . Creatinine,  Ser 07/27/2015 1.18  0.61 - 1.24 mg/dL Final  . Calcium 07/27/2015 8.4* 8.9 - 10.3 mg/dL Final  . GFR calc non Af Amer 07/27/2015 >60  >60 mL/min Final  . GFR calc Af Amer 07/27/2015 >60  >60 mL/min Final   Comment: (NOTE) The eGFR has been calculated using the CKD EPI equation. This calculation has not been validated in all clinical situations. eGFR's persistently <60 mL/min signify possible Chronic Kidney Disease.   . Anion gap 07/27/2015 8  5 - 15 Final  . WBC 07/27/2015 3.4* 4.0 - 10.5 K/uL Final  . RBC 07/27/2015 3.33* 4.22 - 5.81 MIL/uL Final  . Hemoglobin 07/27/2015 12.0* 13.0 - 17.0 g/dL Final  . HCT 07/27/2015 34.9* 39.0 - 52.0 % Final  . MCV 07/27/2015 104.8* 78.0 - 100.0 fL Final  . MCH 07/27/2015 36.0* 26.0 - 34.0 pg Final  . MCHC 07/27/2015 34.4  30.0 - 36.0 g/dL Final  . RDW 07/27/2015 13.8  11.5 - 15.5 % Final  . Platelets 07/27/2015 48* 150 - 400 K/uL Final   CONSISTENT WITH PREVIOUS RESULT  . Glucose-Capillary 07/27/2015 136* 65 - 99 mg/dL Final  . Glucose-Capillary 07/28/2015 122* 65 - 99 mg/dL Final  . Comment 1 07/28/2015 Notify RN   Final  . Comment 2 07/28/2015 Document in Chart   Final  . WBC 07/28/2015 3.9* 4.0 - 10.5 K/uL Final  . RBC 07/28/2015 3.40* 4.22 - 5.81 MIL/uL Final  . Hemoglobin 07/28/2015 12.0* 13.0 - 17.0 g/dL Final  . HCT 07/28/2015 36.0* 39.0 - 52.0 % Final  . MCV 07/28/2015 105.9* 78.0 - 100.0 fL Final  . MCH 07/28/2015 35.3* 26.0 - 34.0 pg Final  . MCHC 07/28/2015 33.3  30.0 - 36.0 g/dL Final  . RDW 07/28/2015 13.9  11.5 - 15.5 % Final  . Platelets 07/28/2015 45* 150 - 400 K/uL Final   CONSISTENT WITH PREVIOUS RESULT  . Sodium 07/28/2015 139  135 - 145 mmol/L Final  . Potassium 07/28/2015 4.1  3.5 - 5.1 mmol/L Final  . Chloride 07/28/2015 112* 101 - 111 mmol/L Final  . CO2 07/28/2015 21* 22 - 32 mmol/L Final  . Glucose, Bld 07/28/2015 154* 65 - 99 mg/dL Final  . BUN 07/28/2015 16  6 - 20 mg/dL Final  . Creatinine, Ser 07/28/2015 1.07   0.61 - 1.24 mg/dL Final  . Calcium 07/28/2015 8.0* 8.9 - 10.3 mg/dL Final  . GFR calc non Af Amer 07/28/2015 >60  >60 mL/min Final  . GFR calc Af Amer 07/28/2015 >60  >60 mL/min Final   Comment: (NOTE) The eGFR has been calculated using the CKD EPI equation. This calculation has not been validated in all clinical situations. eGFR's persistently <60 mL/min signify possible Chronic Kidney Disease.   . Anion gap 07/28/2015 6  5 - 15 Final  . Glucose-Capillary 07/29/2015 93  65 - 99 mg/dL Final    No results found.   Assessment/Plan    Monica S. Perlie Gold  [facility] [place], [place] [address] [phone] Cell (Monday-Friday 8 AM - 5 PM) [phone] After 5 PM and follow prompts  This encounter was created in error - please disregard.

## 2015-10-11 ENCOUNTER — Non-Acute Institutional Stay (SKILLED_NURSING_FACILITY): Payer: Medicaid Other | Admitting: Adult Health

## 2015-10-11 ENCOUNTER — Encounter: Payer: Self-pay | Admitting: Adult Health

## 2015-10-11 DIAGNOSIS — N184 Chronic kidney disease, stage 4 (severe): Secondary | ICD-10-CM

## 2015-10-11 DIAGNOSIS — K7290 Hepatic failure, unspecified without coma: Secondary | ICD-10-CM

## 2015-10-11 DIAGNOSIS — D638 Anemia in other chronic diseases classified elsewhere: Secondary | ICD-10-CM

## 2015-10-11 DIAGNOSIS — K219 Gastro-esophageal reflux disease without esophagitis: Secondary | ICD-10-CM

## 2015-10-11 DIAGNOSIS — N179 Acute kidney failure, unspecified: Secondary | ICD-10-CM

## 2015-10-11 DIAGNOSIS — K7682 Hepatic encephalopathy: Secondary | ICD-10-CM

## 2015-10-11 DIAGNOSIS — F1020 Alcohol dependence, uncomplicated: Secondary | ICD-10-CM

## 2015-10-11 DIAGNOSIS — K7031 Alcoholic cirrhosis of liver with ascites: Secondary | ICD-10-CM

## 2015-10-11 NOTE — Progress Notes (Signed)
Patient ID: Jose [name], male   DOB: 09-30-1955, 60 y.o.   MRN: [id number]   Location:  [facility] Room Number: 143-B Place of Service:  SNF (31)   CODE STATUS: Full Code  No Known Allergies  Chief Complaint  Patient presents with  . Medical Management of Chronic Issues    Follow up    HPI:  He is a resident of this facility being seen for the management of his chronic illnesses. Overall his status remains stable. He is not voicing any complaints today. He is working on going to a placement center in [place]; to help with re-integration into the community. There are no nursing concerns at this time.    Past Medical History  Diagnosis Date  . Hypertension   . Hepatitis C   . Alcohol abuse   . Ascites   . Cirrhosis ([place])   . Shortness of breath     with exertion  . Depression   . Alcoholism ([place]) 07/12/2015    Past Surgical History  Procedure Laterality Date  . Colonoscopy  march 2015    Dr. Renee Harder: nodular mucosa at appendiceal orifice, tubular adenoma, extremely poor prep  . Circumcision      Social History   Social History  . Marital Status: Single    Spouse Name: N/A  . Number of Children: N/A  . Years of Education: N/A   Occupational History  . Not on file.   Social History Main Topics  . Smoking status: Light Tobacco Smoker -- 0.50 packs/day    Types: Cigarettes  . Smokeless tobacco: Never Used     Comment: STATES HE SMOKES 2-3 TIMES MONTHLY  . Alcohol Use: Yes     Comment: A couple beers every few days   . Drug Use: Yes    Special: Marijuana, Cocaine     Comment: last used 11/03/13  . 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 Not on file   Social History Narrative   Family History  Problem Relation Age of Onset  . Breast cancer Mother   . Hypertension Mother   . Diabetes Father   . Hypertension Sister   . Heart disease Sister   . Hypertension  Paternal Grandmother   . Colon cancer Neg Hx       VITAL SIGNS BP 118/68 mmHg  Pulse 72  Temp(Src) 98.1 F (36.7 C) (Oral)  Resp 18  Wt 197 lb 2 oz (89.415 kg)  SpO2 96%  Patient's Medications  New Prescriptions   No medications on file  Previous Medications   FOLIC ACID (FOLVITE) 1 MG TABLET    Take 1 tablet (1 mg total) by mouth daily.   FUROSEMIDE (LASIX) 40 MG TABLET    Take 1 tablet (40 mg total) by mouth 2 (two) times daily.   KLOR-CON M20 20 MEQ TABLET    Take 20 mEq by mouth 2 (two) times daily.   LACTULOSE (CHRONULAC) 10 GM/15ML SOLUTION    Take 45 mLs (30 g total) by mouth 3 (three) times daily.   MAGNESIUM OXIDE (MAG-OX) 400 (241.3 MG) MG TABLET    Take 1 tablet (400 mg total) by mouth daily.   MULTIPLE VITAMIN (MULTIVITAMIN) TABLET    Take 1 tablet by mouth daily.   OMEPRAZOLE (PRILOSEC) 20 MG CAPSULE    Take 20 mg by mouth daily.   RIFAXIMIN (XIFAXAN) 550 MG TABS  TABLET    Take 1 tablet (550 mg total) by mouth 2 (two) times daily.   SPIRONOLACTONE (ALDACTONE) 100 MG TABLET    Take 0.5 tablets (50 mg total) by mouth daily.   THIAMINE 100 MG TABLET    Take 1 tablet (100 mg total) by mouth daily.  Modified Medications   No medications on file  Discontinued Medications   No medications on file     SIGNIFICANT DIAGNOSTIC EXAMS  09-04-15; abdominal ultrasound: 1. Heterogeneously increased hepatic echotexture with irregularity of the hepatic contour consistent with known cirrhosis. Solid-appearing right lobe mass measuring 3 cm in greatest dimension. Further evaluation with hepatic protocol MRI is recommended. 2. Gallstones without sonographic evidence of acute cholecystitis. There is mild gallbladder wall thickening which has been observed in the past. 3. Chronic mild right-sided hydronephrosis.   09-05-15: mri of abdomen with mrcp: 1. Limited examination due to timing of the contrast administration and lack of precontrast LAVA sequence. There is a T1 bright 2.8 cm  lesion in segment 7 of the liver. Contrast enhancement is difficult to assess but this is considered a hepatoma. Recommend ultrasound-guided biopsy. 2. Advanced cirrhotic changes with portal venous hypertension, portal venous collaterals and esophageal varices. 3. Suspect iron deposition in liver and pancreas. 4. Gallstones, gallbladder wall thickening and pericholecystic fluid.  09-06-15: 2-d echo:  Impressions: - Vigorous LV systolic function; grade 1 diastolic dysfunction; calcified aortic valve; elevated mean gradient of 17 mmHg suggests mild AS but visually valve opens well; some of elevated gradient likely related to vigorous LV function; mild AI; trace MR; moderate LAE; mild RAE; trace TR with mildly elevated pulmonary pressure. - Left ventricle: The cavity size was normal. Wall thickness was increased in a pattern of mild LVH. Systolic function was vigorous. The estimated ejection fraction was in the range of 65% to 70%. Wall motion was normal; there were no regional wall motion abnormalities. Doppler parameters are consistent with abnormal left ventricular relaxation (grade 1 diastolic dysfunction). - Aortic valve: There was mild regurgitation. - Mitral valve: Calcified annulus. - Left atrium: The atrium was moderately dilated. - Right atrium: The atrium was mildly dilated. - Pulmonary arteries: Systolic pressure was mildly increased. PA   peak pressure: 33 mm Hg (S).    LABS REVIEWED:   09-04-15: ammonia: 142 09-05-15: ammonia 47; AFP tumor marker: 34.4 09-06-15: wbc 3.6; hgb 11.4; hct 32.5 ;mcv 102.8; plt 41; glucose 119; bun 15; creat 1.02; k+ 4.1; na++ 130; ast 112; alt 61; alk phos 143; total bili 1.8; albumin 2.0; vit B 12: 1377; folate 20.8; iron 157; tibc: 188 09-12-15: wbc 4.1; hgb 12.2; hct 35.1; mcv 102.9; plt 50; glucose 117; bun 9; creat 0.91; k+ 4.1; na++ 133; ast 109; alt 57; alk phos 151; total bili 2.0; albumin 2.6; ammonia 138 09-17-15; ammonia 187 10-10-15: ammonia 139      Review of Systems  Constitutional:negative for fatigue .  Respiratory: Negative for cough and shortness of breath.   Cardiovascular: Negative for chest pain, palpitations and leg swelling.  Gastrointestinal: Negative for heartburn, abdominal pain and constipation.  Musculoskeletal: Negative for myalgias, back pain and joint pain.  Skin: Negative.   Neurological: Negative for dizziness.  Psychiatric/Behavioral: The patient is not nervous/anxious.     Physical Exam  Constitutional: No distress.  Eyes: Conjunctivae are normal. Scleral icterus is present.  Neck: Neck supple. No JVD present. No thyromegaly present.  Cardiovascular: Normal rate, regular rhythm and intact distal pulses.   Murmur heard. 2/6  Respiratory:  Effort normal and breath sounds normal. No respiratory distress. He has no wheezes.  GI: Soft. Bowel sounds are normal. He exhibits no distension. No tenderness present Ascites present hepatomegaly   Musculoskeletal: He exhibits no edema.  Able to move all extremities   Lymphadenopathy:    He has no cervical adenopathy.  Neurological: He is alert.  Skin: Skin is warm and dry. He is not diaphoretic.  Psychiatric: He has a normal mood and affect.    ASSESSMENT/ PLAN:  1. Hepatic encephalopathy: his ammonia level has improved to 139 from 189; will continue lactulose 45 cc three times daily and xifaxan 550 mg twice daily   2. Alcoholic liver cirrhosis with ascites: will continue lasix 40 mg twice daily; aldactone 50 mg daily mag ox 400 mg daily   3. Jerrye Bushy; will continue prilosec 20 mg daily  4. Stage IV renal disease: bun/creat 9/0.91; will monitor  5. Alcoholism: will continue thiamine and folic acid daily   6. Anemia of chronic disease; hgb 12.2; will monitor          Ok Edwards NP [facility]  Contact [phone] Monday through Friday 8am- 5pm  After hours call [phone]

## 2015-10-17 ENCOUNTER — Telehealth: Payer: Self-pay | Admitting: Hematology and Oncology

## 2015-10-17 NOTE — Telephone Encounter (Signed)
#   not in service....mailed pt appt sched and letter

## 2015-11-07 ENCOUNTER — Encounter: Payer: Self-pay | Admitting: Adult Health

## 2015-11-07 ENCOUNTER — Non-Acute Institutional Stay (SKILLED_NURSING_FACILITY): Payer: Medicaid Other | Admitting: Adult Health

## 2015-11-07 DIAGNOSIS — K7031 Alcoholic cirrhosis of liver with ascites: Secondary | ICD-10-CM | POA: Diagnosis not present

## 2015-11-07 DIAGNOSIS — I10 Essential (primary) hypertension: Secondary | ICD-10-CM

## 2015-11-07 DIAGNOSIS — R5381 Other malaise: Secondary | ICD-10-CM | POA: Diagnosis not present

## 2015-11-07 DIAGNOSIS — N184 Chronic kidney disease, stage 4 (severe): Secondary | ICD-10-CM | POA: Diagnosis not present

## 2015-11-07 DIAGNOSIS — N179 Acute kidney failure, unspecified: Secondary | ICD-10-CM

## 2015-11-07 NOTE — Progress Notes (Signed)
Patient ID: Jose [name], male   DOB: 1956-03-24, 60 y.o.   MRN: [id number]   Location:   [facility] Room Number: 143-B Place of Service:  SNF (31)    CODE STATUS: Full Code  No Known Allergies  Chief Complaint  Patient presents with  . Discharge Note    Discharge    HPI:  He is being discharged to home. He will not need any home or dme. He is independent with his adl needs. He will need his prescriptions to be written. He will need to follow up with his medical provider.    Past Medical History  Diagnosis Date  . Hypertension   . Hepatitis C   . Alcohol abuse   . Ascites   . Cirrhosis (HCC)   . Shortness of breath     with exertion  . Depression   . Alcoholism (HCC) 07/12/2015    Past Surgical History  Procedure Laterality Date  . Colonoscopy  march 2015    Dr. Steele Sizer: nodular mucosa at appendiceal orifice, tubular adenoma, extremely poor prep  . Circumcision      Social History   Social History  . Marital Status: Single    Spouse Name: N/A  . Number of Children: N/A  . Years of Education: N/A   Occupational History  . Not on file.   Social History Main Topics  . Smoking status: Light Tobacco Smoker -- 0.50 packs/day    Types: Cigarettes  . Smokeless tobacco: Never Used     Comment: STATES HE SMOKES 2-3 TIMES MONTHLY  . Alcohol Use: Yes     Comment: A couple beers every few days   . Drug Use: Yes    Special: Marijuana, Cocaine     Comment: last used 11/03/13  . 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 Not on file   Social History Narrative   Family History  Problem Relation Age of Onset  . Breast cancer Mother   . Hypertension Mother   . Diabetes Father   . Hypertension Sister   . Heart disease Sister   . Hypertension Paternal Grandmother   . Colon cancer Neg Hx     VITAL SIGNS BP 113/70 mmHg  Pulse 70  Temp(Src) 97.5 F (36.4 C) (Oral)  Resp 18  Ht 5\' 6"   (1.676 m)  Wt 196 lb 2 oz (88.962 kg)  BMI 31.67 kg/m2  SpO2 100%  Patient's Medications  New Prescriptions   No medications on file  Previous Medications   FOLIC ACID (FOLVITE) 1 MG TABLET    Take 1 tablet (1 mg total) by mouth daily.   FUROSEMIDE (LASIX) 40 MG TABLET    Take 1 tablet (40 mg total) by mouth 2 (two) times daily.   KLOR-CON M20 20 MEQ TABLET    Take 20 mEq by mouth 2 (two) times daily.   LACTULOSE (CHRONULAC) 10 GM/15ML SOLUTION    Take 45 mLs (30 g total) by mouth 3 (three) times daily.   MAGNESIUM OXIDE (MAG-OX) 400 (241.3 MG) MG TABLET    Take 1 tablet (400 mg total) by mouth daily.   MULTIPLE VITAMIN (MULTIVITAMIN) TABLET    Take 1 tablet by mouth daily.   OMEPRAZOLE (PRILOSEC) 20 MG CAPSULE    Take 20 mg by mouth daily.   RIFAXIMIN (XIFAXAN) 550 MG TABS TABLET    Take 1 tablet (550 mg total) by  mouth 2 (two) times daily.   SPIRONOLACTONE (ALDACTONE) 100 MG TABLET    Take 0.5 tablets (50 mg total) by mouth daily.   THIAMINE 100 MG TABLET    Take 1 tablet (100 mg total) by mouth daily.  Modified Medications   No medications on file  Discontinued Medications   No medications on file     SIGNIFICANT DIAGNOSTIC EXAMS  09-04-15; abdominal ultrasound: 1. Heterogeneously increased hepatic echotexture with irregularity of the hepatic contour consistent with known cirrhosis. Solid-appearing right lobe mass measuring 3 cm in greatest dimension. Further evaluation with hepatic protocol MRI is recommended. 2. Gallstones without sonographic evidence of acute cholecystitis. There is mild gallbladder wall thickening which has been observed in the past. 3. Chronic mild right-sided hydronephrosis.   09-05-15: mri of abdomen with mrcp: 1. Limited examination due to timing of the contrast administration and lack of precontrast LAVA sequence. There is a T1 bright 2.8 cm lesion in segment 7 of the liver. Contrast enhancement is difficult to assess but this is considered a hepatoma.  Recommend ultrasound-guided biopsy. 2. Advanced cirrhotic changes with portal venous hypertension, portal venous collaterals and esophageal varices. 3. Suspect iron deposition in liver and pancreas. 4. Gallstones, gallbladder wall thickening and pericholecystic fluid.  09-06-15: 2-d echo:  Impressions: - Vigorous LV systolic function; grade 1 diastolic dysfunction; calcified aortic valve; elevated mean gradient of 17 mmHg suggests mild AS but visually valve opens well; some of elevated gradient likely related to vigorous LV function; mild AI; trace MR; moderate LAE; mild RAE; trace TR with mildly elevated pulmonary pressure. - Left ventricle: The cavity size was normal. Wall thickness was increased in a pattern of mild LVH. Systolic function was vigorous. The estimated ejection fraction was in the range of 65% to 70%. Wall motion was normal; there were no regional wall motion abnormalities. Doppler parameters are consistent with abnormal left ventricular relaxation (grade 1 diastolic dysfunction). - Aortic valve: There was mild regurgitation. - Mitral valve: Calcified annulus. - Left atrium: The atrium was moderately dilated. - Right atrium: The atrium was mildly dilated. - Pulmonary arteries: Systolic pressure was mildly increased. PA   peak pressure: 33 mm Hg (S).    LABS REVIEWED:   09-04-15: ammonia: 142 09-05-15: ammonia 47; AFP tumor marker: 34.4 09-06-15: wbc 3.6; hgb 11.4; hct 32.5 ;mcv 102.8; plt 41; glucose 119; bun 15; creat 1.02; k+ 4.1; na++ 130; ast 112; alt 61; alk phos 143; total bili 1.8; albumin 2.0; vit B 12: 1377; folate 20.8; iron 157; tibc: 188 09-12-15: wbc 4.1; hgb 12.2; hct 35.1; mcv 102.9; plt 50; glucose 117; bun 9; creat 0.91; k+ 4.1; na++ 133; ast 109; alt 57; alk phos 151; total bili 2.0; albumin 2.6; ammonia 138 09-17-15; ammonia 187 10-10-15: ammonia 139     Review of Systems  Constitutional:negative for fatigue .  Respiratory: Negative for cough and shortness of  breath.   Cardiovascular: Negative for chest pain, palpitations and leg swelling.  Gastrointestinal: Negative for heartburn, abdominal pain and constipation.  Musculoskeletal: Negative for myalgias, back pain and joint pain.  Skin: Negative.   Neurological: Negative for dizziness.  Psychiatric/Behavioral: The patient is not nervous/anxious.     Physical Exam  Constitutional: No distress.  Eyes: Conjunctivae are normal. Scleral icterus is present.  Neck: Neck supple. No JVD present. No thyromegaly present.  Cardiovascular: Normal rate, regular rhythm and intact distal pulses.   Murmur heard. 2/6  Respiratory: Effort normal and breath sounds normal. No respiratory distress. He has  no wheezes.  GI: Soft. Bowel sounds are normal. He exhibits no distension. No tenderness present Ascites present hepatomegaly   Musculoskeletal: He exhibits no edema.  Able to move all extremities   Lymphadenopathy:    He has no cervical adenopathy.  Neurological: He is alert.  Skin: Skin is warm and dry. He is not diaphoretic.  Psychiatric: He has a normal mood and affect.    ASSESSMENT/ PLAN:   Patient is being discharged with the following home health services:  He will not require home health   Patient is being discharged with the following durable medical equipment: he does not require any dme.    Patient has been advised to f/u with their PCP in 1-2 weeks to bring them up to date on their rehab stay.  Social services at facility was responsible for arranging this appointment.  Pt was provided with a 30 day supply of prescriptions for medications and refills must be obtained from their PCP.  For controlled substances, a more limited supply may be provided adequate until PCP appointment only.   Time spent with patient 40   minutes >50% time spent counseling; reviewing medical record; tests; labs; and developing future plan of care   Ok Edwards NP [facility]  Contact [phone]  Monday through Friday 8am- 5pm  After hours call [phone]

## 2015-11-12 MED FILL — POTASSIUM CL 20 MEQ PACKET: 20 | 30 days supply | Qty: 60 | Fill #0

## 2015-11-12 MED FILL — FUROSEMIDE 40 MG TABLET: 40 | 30 days supply | Qty: 60 | Fill #0

## 2015-11-12 MED FILL — spIRONOLACTONE 100 MG TAB: 100 | 30 days supply | Qty: 15 | Fill #0

## 2015-11-12 MED FILL — XIFAXAN 550 MG TABLET: 550 | 30 days supply | Qty: 60 | Fill #0

## 2015-11-12 MED FILL — MAGNESIUM OXIDE 400 MG TAB: 400 | 30 days supply | Qty: 30 | Fill #0

## 2015-12-18 ENCOUNTER — Emergency Department (HOSPITAL_BASED_OUTPATIENT_CLINIC_OR_DEPARTMENT_OTHER)
Admission: EM | Admit: 2015-12-18 | Discharge: 2015-12-18 | Disposition: A | Discharge status: Home / Self Care | Payer: Medicaid Other | Source: Home / Self Care

## 2015-12-18 ENCOUNTER — Emergency Department (HOSPITAL_COMMUNITY)
Admission: EM | Admit: 2015-12-18 | Discharge: 2015-12-18 | Disposition: A | Discharge status: Home / Self Care | Payer: Medicaid Other | Attending: Emergency Medicine | Admitting: Emergency Medicine

## 2015-12-18 ENCOUNTER — Encounter (HOSPITAL_COMMUNITY): Payer: Self-pay | Admitting: *Deleted

## 2015-12-18 DIAGNOSIS — N184 Chronic kidney disease, stage 4 (severe): Secondary | ICD-10-CM | POA: Diagnosis not present

## 2015-12-18 DIAGNOSIS — M7989 Other specified soft tissue disorders: Secondary | ICD-10-CM

## 2015-12-18 DIAGNOSIS — I129 Hypertensive chronic kidney disease with stage 1 through stage 4 chronic kidney disease, or unspecified chronic kidney disease: Secondary | ICD-10-CM | POA: Insufficient documentation

## 2015-12-18 DIAGNOSIS — Z79899 Other long term (current) drug therapy: Secondary | ICD-10-CM | POA: Diagnosis not present

## 2015-12-18 DIAGNOSIS — F17210 Nicotine dependence, cigarettes, uncomplicated: Secondary | ICD-10-CM | POA: Diagnosis not present

## 2015-12-18 MED ORDER — METHOCARBAMOL 500 MG PO TABS: 500.0000 mg | ORAL_TABLET | Freq: Two times a day (BID) | ORAL | 0 refills | Status: DC

## 2015-12-18 NOTE — Progress Notes (Signed)
*  Preliminary Results* Right lower extremity venous duplex completed. Study was technically limited and difficult due to hernia. Visualized veins of the right lower extremity are negative for deep vein thrombosis. There is no evidence of right Baker's cyst.  12/18/2015 7:08 PM  Maudry Mayhew, BS, RVT, RDCS, RDMS

## 2015-12-18 NOTE — ED Notes (Signed)
Pt declined dc vitals.

## 2015-12-18 NOTE — ED Notes (Signed)
VENOUS DOPPLER PRESENT AT [facility]

## 2015-12-18 NOTE — ED Provider Notes (Signed)
[facility] DEPT Provider Note   CSN: [id number] Arrival date & time: 12/18/15  1515     History   Chief Complaint Chief Complaint  Patient presents with  . Leg Swelling    HPI Jose [name] is a 60 y.o. male.  60 y/o male here w/ rle swelling localized to calf for 3 days Denies trauma or h/o dvt No chest pain or sob Some sharp pain when walking that's better w/ rest Denies fever or rashes Went to pcp today and sent to ED to r/o dvt No tx used pta       Past Medical History:  Diagnosis Date  . Alcohol abuse   . Alcoholism ([place]) 07/12/2015  . Ascites   . Cirrhosis ([place])   . Depression   . Hepatitis C   . Hypertension   . Shortness of breath    with exertion    Patient Active Problem List   Diagnosis Date Noted  . Inguinal hernia 09/12/2015  . Essential hypertension 09/12/2015  . Gastroesophageal reflux disease without esophagitis 09/12/2015  . Protein-calorie malnutrition, severe ([employer]) 09/12/2015  . Anemia of chronic disease 09/12/2015  . Arthritis of left knee 09/12/2015  . Cirrhosis of liver with ascites ([place])   . Elevated LFTs   . AKI (acute kidney injury) ([place]) 09/03/2015  . Weakness 09/03/2015  . Generalized weakness   . Physical deconditioning   . Acute-on-chronic renal failure ([place])   . Alcoholic cirrhosis of liver with ascites ([place]) 07/25/2015  . Acute renal failure superimposed on stage 4 chronic kidney disease ([place]) 07/25/2015  . Hypokalemia 07/23/2015  . Macrocytic anemia 07/23/2015  . Alcoholism ([place]) 07/12/2015  . ARF (acute renal failure) ([place]) 06/27/2015  . Metabolic encephalopathy [id number]  . Hepatic encephalopathy ([place]) 06/26/2015  . Trichomonas infection 06/26/2015  . 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 Adenomatous polyps 10/18/2013  . Unspecified vitamin D deficiency 07/13/2013  . Atopic dermatitis 04/28/2013  . Thrombocytopenia, unspecified ([place]) 04/28/2013  . Anasarca 04/26/2013  . UTI (urinary tract infection) 04/26/2013     Past Surgical History:  Procedure Laterality Date  . CIRCUMCISION    . COLONOSCOPY  march 2015   Dr. Renee Harder: nodular mucosa at appendiceal orifice, tubular adenoma, extremely poor prep       Home Medications    Prior to Admission medications   Medication Sig Start Date End Date Taking? Authorizing Provider  folic acid (FOLVITE) 1 MG tablet Take 1 tablet (1 mg total) by mouth daily. 07/29/15   Barton Dubois, MD  furosemide (LASIX) 40 MG tablet Take 1 tablet (40 mg total) by mouth 2 (two) times daily. 09/05/15   Reyne Dumas, MD  KLOR-CON M20 20 MEQ tablet Take 20 mEq by mouth 2 (two) times daily. 07/20/15   Historical Provider, MD  lactulose (CHRONULAC) 10 GM/15ML solution Take 45 mLs (30 g total) by mouth 3 (three) times daily. 09/05/15   Reyne Dumas, MD  magnesium oxide (MAG-OX) 400 (241.3 Mg) MG tablet Take 1 tablet (400 mg total) by mouth daily. 09/05/15   Reyne Dumas, MD  Multiple Vitamin (MULTIVITAMIN) tablet Take 1 tablet by mouth daily.    Historical Provider, MD  omeprazole (PRILOSEC) 20 MG capsule Take 20 mg by mouth daily.    Historical Provider, MD  rifaximin (XIFAXAN) 550 MG TABS tablet Take 1 tablet (550 mg total) by mouth 2 (two) times daily. 09/26/15   Gerlene Fee, NP  spironolactone (ALDACTONE) 100 MG tablet Take 0.5 tablets (50 mg total) by  mouth daily. 09/05/15   Reyne Dumas, MD  thiamine 100 MG tablet Take 1 tablet (100 mg total) by mouth daily. 09/05/15   Reyne Dumas, MD    Family History Family History  Problem Relation Age of Onset  . Breast cancer Mother   . Hypertension Mother   . Diabetes Father   . Hypertension Sister   . Heart disease Sister   . Hypertension Paternal Grandmother   . Colon cancer Neg Hx     Social History Social History  Substance Use Topics  . Smoking status: Light Tobacco Smoker    Packs/day: 0.50    Types: Cigarettes  . Smokeless tobacco: Never Used     Comment: STATES HE SMOKES 2-3 TIMES MONTHLY  . Alcohol use Yes      Comment: A couple beers every few days      Allergies   Review of patient's allergies indicates no known allergies.   Review of Systems Review of Systems  All other systems reviewed and are negative.    Physical Exam Updated Vital Signs BP 126/79 (BP Location: Right Arm)   Pulse 61   Temp 98.2 F (36.8 C) (Oral)   Ht 5\' 6"  (1.676 m)   Wt 97.1 kg   SpO2 100%   BMI 34.54 kg/m   Physical Exam  Constitutional: He is oriented to person, place, and time. He appears well-developed and well-nourished.  Non-toxic appearance. No distress.  HENT:  Head: Normocephalic and atraumatic.  Eyes: Conjunctivae, EOM and lids are normal. Pupils are equal, round, and reactive to light.  Neck: Normal range of motion. Neck supple. No tracheal deviation present. No thyroid mass present.  Cardiovascular: Normal rate, regular rhythm and normal heart sounds.  Exam reveals no gallop.   No murmur heard. Pulmonary/Chest: Effort normal and breath sounds normal. No stridor. No respiratory distress. He has no decreased breath sounds. He has no wheezes. He has no rhonchi. He has no rales.  Abdominal: Soft. Normal appearance and bowel sounds are normal. He exhibits no distension. There is no tenderness. There is no rebound and no CVA tenderness.  Musculoskeletal: Normal range of motion. He exhibits no tenderness.       Right lower leg: He exhibits swelling and edema.       Legs: Neurological: He is alert and oriented to person, place, and time. He has normal strength. No cranial nerve deficit or sensory deficit. GCS eye subscore is 4. GCS verbal subscore is 5. GCS motor subscore is 6.  Skin: Skin is warm and dry. No abrasion and no rash noted.  Psychiatric: He has a normal mood and affect. His speech is normal and behavior is normal.  Nursing note and vitals reviewed.    ED Treatments / Results  Labs (all labs ordered are listed, but only abnormal results are displayed) Labs Reviewed - No data to  display  EKG  EKG Interpretation None       Radiology No results found.  Procedures Procedures (including critical care time)  Medications Ordered in ED Medications - No data to display   Initial Impression / Assessment and Plan / ED Course  I have reviewed the triage vital signs and the nursing notes.  Pertinent labs & imaging results that were available during my care of the patient were reviewed by me and considered in my medical decision making (see chart for details).  Clinical Course    Doppler negative for DVT. Patient stable for discharge  Final Clinical Impressions(s) / ED  Diagnoses   Final diagnoses:  None    New Prescriptions New Prescriptions   No medications on file     Lacretia Leigh, MD 12/18/15 2008

## 2015-12-18 NOTE — ED Triage Notes (Addendum)
Pt sent by his PCP for possible blood clot in his right leg. Pt states he noticed his right leg swelling on Sunday morning.

## 2015-12-18 NOTE — ED Notes (Signed)
DOPPLER NEGATIVE

## 2015-12-19 MED FILL — FUROSEMIDE 40 MG TABLET: 40 | 30 days supply | Qty: 60 | Fill #0

## 2015-12-19 MED FILL — spIRONOLACTONE 100 MG TAB: 100 | 30 days supply | Qty: 15 | Fill #0

## 2015-12-19 MED FILL — MAGNESIUM OXIDE 400 MG TAB: 400 | 30 days supply | Qty: 30 | Fill #0

## 2015-12-19 MED FILL — METHOCARBAMOL 500 MG TABLET: 500 | 10 days supply | Qty: 20 | Fill #0

## 2015-12-28 ENCOUNTER — Ambulatory Visit: Payer: Medicaid Other | Admitting: Hematology and Oncology

## 2015-12-28 ENCOUNTER — Other Ambulatory Visit: Payer: Medicaid Other

## 2016-01-02 ENCOUNTER — Telehealth: Payer: Self-pay

## 2016-01-02 NOTE — Telephone Encounter (Signed)
Pt clarifying appt for Friday. Asking if Dr Alvy Bimler will do his surgery here. Told him she will arrange things during the visit. We do not do surgery at [facility].

## 2016-01-04 ENCOUNTER — Ambulatory Visit (HOSPITAL_BASED_OUTPATIENT_CLINIC_OR_DEPARTMENT_OTHER): Payer: Medicaid Other | Admitting: Hematology and Oncology

## 2016-01-04 ENCOUNTER — Other Ambulatory Visit: Payer: Medicaid Other

## 2016-01-04 VITALS — BP 127/66 | HR 56 | Temp 98.0°F | Resp 18 | Wt 209.8 lb

## 2016-01-04 DIAGNOSIS — K4090 Unilateral inguinal hernia, without obstruction or gangrene, not specified as recurrent: Secondary | ICD-10-CM | POA: Diagnosis not present

## 2016-01-04 DIAGNOSIS — D696 Thrombocytopenia, unspecified: Secondary | ICD-10-CM

## 2016-01-04 DIAGNOSIS — E46 Unspecified protein-calorie malnutrition: Secondary | ICD-10-CM | POA: Diagnosis not present

## 2016-01-04 DIAGNOSIS — F1020 Alcohol dependence, uncomplicated: Secondary | ICD-10-CM

## 2016-01-04 DIAGNOSIS — K7031 Alcoholic cirrhosis of liver with ascites: Secondary | ICD-10-CM | POA: Diagnosis not present

## 2016-01-04 DIAGNOSIS — E43 Unspecified severe protein-calorie malnutrition: Secondary | ICD-10-CM

## 2016-01-04 DIAGNOSIS — B182 Chronic viral hepatitis C: Secondary | ICD-10-CM | POA: Diagnosis not present

## 2016-01-04 LAB — CBC WITH DIFFERENTIAL/PLATELET
BASO%: 0.9 % (ref 0.0–2.0)
BASOS ABS: 0 10*3/uL (ref 0.0–0.1)
EOS ABS: 0.1 10*3/uL (ref 0.0–0.5)
EOS%: 1.9 % (ref 0.0–7.0)
HCT: 40.2 % (ref 38.4–49.9)
HEMOGLOBIN: 13.3 g/dL (ref 13.0–17.1)
LYMPH%: 16.7 % (ref 14.0–49.0)
MCH: 36.1 pg — AB (ref 27.2–33.4)
MCHC: 33.1 g/dL (ref 32.0–36.0)
MCV: 109.2 fL — AB (ref 79.3–98.0)
MONO#: 0.7 10*3/uL (ref 0.1–0.9)
MONO%: 19.9 % — ABNORMAL HIGH (ref 0.0–14.0)
NEUT%: 60.6 % (ref 39.0–75.0)
NEUTROS ABS: 2.3 10*3/uL (ref 1.5–6.5)
PLATELETS: 54 10*3/uL — AB (ref 140–400)
RBC: 3.68 10*6/uL — AB (ref 4.20–5.82)
RDW: 14.2 % (ref 11.0–14.6)
WBC: 3.8 10*3/uL — AB (ref 4.0–10.3)
lymph#: 0.6 10*3/uL — ABNORMAL LOW (ref 0.9–3.3)

## 2016-01-04 LAB — COMPREHENSIVE METABOLIC PANEL
ALBUMIN: 2.3 g/dL — AB (ref 3.5–5.0)
ALK PHOS: 231 U/L — AB (ref 40–150)
ALT: 52 U/L (ref 0–55)
ANION GAP: 7 meq/L (ref 3–11)
AST: 98 U/L — ABNORMAL HIGH (ref 5–34)
BILIRUBIN TOTAL: 2.1 mg/dL — AB (ref 0.20–1.20)
BUN: 14.5 mg/dL (ref 7.0–26.0)
CO2: 18 mEq/L — ABNORMAL LOW (ref 22–29)
Calcium: 8.3 mg/dL — ABNORMAL LOW (ref 8.4–10.4)
Chloride: 113 mEq/L — ABNORMAL HIGH (ref 98–109)
Creatinine: 1 mg/dL (ref 0.7–1.3)
EGFR: >90 mL/min/{1.73_m2} (ref >90)
Glucose: 109 mg/dl (ref 70–140)
Potassium: 4.5 mEq/L (ref 3.5–5.1)
Sodium: 137 mEq/L (ref 136–145)
TOTAL PROTEIN: 6.8 g/dL (ref 6.4–8.3)

## 2016-01-05 ENCOUNTER — Encounter: Payer: Self-pay | Admitting: Hematology and Oncology

## 2016-01-05 NOTE — Progress Notes (Signed)
Jose [name]  PLACEY,MARY H, NP SUMMARY OF HEMATOLOGIC HISTORY:  Chronic thrombocytopenia, untreated hepatitis C  HISTORY OF PRESENTING ILLNESS:  Jose [name] 60 y.o. male is here because of thrombocytopenia.  He was found to have abnormal CBC from recent blood work. This patient have chronic untreated hepatitis C, liver disease with cirrhosis and chronic alcoholism. He was recently discharged from the hospital with acute encephalopathy which improved upon supportive care management. He denies recent bruising/bleeding, such as spontaneous epistaxis, hematuria, melena or hematochezia The patient had poor social circumstances. He lives in a group home/facility. He continues to drink on a regular basis, last alcohol intake was over a day ago. He has not been treated for hepatitis C, presumably due to noncompliance and chronic alcoholism. He has chronic right lower abdominal pain from abdominal hernia but that was not surgically repaired due to chronic thrombocytopenia In May, he had extensive workup including liver biopsy which excluded hepatoma INTERVAL HISTORY: Jose [name] 60 y.o. male returns for further follow-up. He is present with his sister. He has quit drinking for 2-3 months. He has significant discomfort due to right inguinal hernia. His abdomen is distended with ascites. He does not have follow-up with anybody except here. He denies recent infection. The patient denies any recent signs or symptoms of bleeding such as spontaneous epistaxis, hematuria or hematochezia.   I have reviewed the past medical history, past surgical history, social history and family history with the patient and they are unchanged from previous [name].  ALLERGIES:  has No Known Allergies.  MEDICATIONS:  Current Outpatient Prescriptions  Medication Sig Dispense Refill  . folic acid (FOLVITE) 1 MG tablet Take 1 tablet (1 mg total) by mouth daily.    . furosemide  (LASIX) 40 MG tablet Take 1 tablet (40 mg total) by mouth 2 (two) times daily. 90 tablet 3  . KLOR-CON M20 20 MEQ tablet Take 20 mEq by mouth 2 (two) times daily.  0  . lactulose (CHRONULAC) 10 GM/15ML solution Take 45 mLs (30 g total) by mouth 3 (three) times daily. 240 mL 0  . magnesium oxide (MAG-OX) 400 (241.3 Mg) MG tablet Take 1 tablet (400 mg total) by mouth daily. 30 tablet 0  . methocarbamol (ROBAXIN) 500 MG tablet Take 1 tablet (500 mg total) by mouth 2 (two) times daily. 20 tablet 0  . Multiple Vitamin (MULTIVITAMIN) tablet Take 1 tablet by mouth daily.    Marland Kitchen omeprazole (PRILOSEC) 20 MG capsule Take 20 mg by mouth daily.    . rifaximin (XIFAXAN) 550 MG TABS tablet Take 1 tablet (550 mg total) by mouth 2 (two) times daily. 90 tablet prn  . spironolactone (ALDACTONE) 100 MG tablet Take 0.5 tablets (50 mg total) by mouth daily. 30 tablet 3  . thiamine 100 MG tablet Take 1 tablet (100 mg total) by mouth daily.     No current facility-administered medications for this visit.      REVIEW OF SYSTEMS:   Constitutional: Denies fevers, chills or night sweats Eyes: Denies blurriness of vision Ears, nose, mouth, throat, and face: Denies mucositis or sore throat Respiratory: Denies cough, dyspnea or wheezes Cardiovascular: Denies palpitation, chest discomfort or lower extremity swelling Gastrointestinal:  Denies nausea, heartburn or change in bowel habits Skin: Denies abnormal skin rashes Lymphatics: Denies new lymphadenopathy Neurological:Denies numbness, tingling or new weaknesses Behavioral/Psych: Mood is stable, no new changes  All other systems were reviewed with the patient and are negative.  PHYSICAL EXAMINATION:  ECOG PERFORMANCE STATUS: 1 - Symptomatic but completely ambulatory  Vitals:   01/04/16 0818  BP: 127/66  Pulse: (!) 56  Resp: 18  Temp: 98 F (36.7 C)   Filed Weights   01/04/16 0818  Weight: 209 lb 12.8 oz (95.2 kg)    GENERAL:alert, no distress and  comfortable SKIN: skin color, texture, turgor are normal, no rashes or significant lesions EYES: normal, Conjunctiva are pink and non-injected, sclera clear OROPHARYNX:no exudate, no erythema and lips, buccal mucosa, and tongue normal  NECK: supple, thyroid normal size, non-tender, without nodularity LYMPH:  no palpable lymphadenopathy in the cervical, axillary or inguinal LUNGS: clear to auscultation and percussion with normal breathing effort HEART: regular rate & rhythm and no murmurs and no lower extremity edema ABDOMEN:abdomen soft, non-tender and normal bowel sounds. It is distended with ascites. Noted large inguinal hernia Musculoskeletal:no cyanosis of digits and no clubbing  NEURO: alert & oriented x 3 with fluent speech, no focal motor/sensory deficits  LABORATORY DATA:  I have reviewed the data as listed     Component Value Date/Time   NA 137 01/04/2016 0809   K 4.5 01/04/2016 0809   CL 102 09/07/2015 0648   CO2 18 (L) 01/04/2016 0809   GLUCOSE 109 01/04/2016 0809   BUN 14.5 01/04/2016 0809   CREATININE 1.0 01/04/2016 0809   CALCIUM 8.3 (L) 01/04/2016 0809   PROT 6.8 01/04/2016 0809   ALBUMIN 2.3 (L) 01/04/2016 0809   AST 98 (H) 01/04/2016 0809   ALT 52 01/04/2016 0809   ALKPHOS 231 (H) 01/04/2016 0809   BILITOT 2.10 (H) 01/04/2016 0809   GFRNONAA >60 09/07/2015 0648   GFRNONAA >89 10/13/2013 1156   GFRAA >60 09/07/2015 0648   GFRAA >89 10/13/2013 1156    No results found for: SPEP, UPEP  Lab Results  Component Value Date   WBC 3.8 (L) 01/04/2016   NEUTROABS 2.3 01/04/2016   HGB 13.3 01/04/2016   HCT 40.2 01/04/2016   MCV 109.2 (H) 01/04/2016   PLT 54 (L) 01/04/2016      Chemistry      Component Value Date/Time   NA 137 01/04/2016 0809   K 4.5 01/04/2016 0809   CL 102 09/07/2015 0648   CO2 18 (L) 01/04/2016 0809   BUN 14.5 01/04/2016 0809   CREATININE 1.0 01/04/2016 0809   GLU 117 09/12/2015      Component Value Date/Time   CALCIUM 8.3 (L)  01/04/2016 0809   ALKPHOS 231 (H) 01/04/2016 0809   AST 98 (H) 01/04/2016 0809   ALT 52 01/04/2016 0809   BILITOT 2.10 (H) 01/04/2016 0809     ASSESSMENT & PLAN:  Hemochromatosis The patient has signs of iron overload and  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 I recommend phlebotomy 1 unit of blood every other week for the next 3 months, goal for ferritin less than 250  I discussed the risk, benefits, side effects of phlebotomy and he agreed to proceed   Thrombocytopenia, unspecified (HCC) The thrombocytopenia is related to untreated hepatitis C and liver disease along with alcoholism. He is not bleeding and does not require further treatment. I will see him back in 6 months for further follow-up  Hepatitis C, chronic ([place]) The patient have history of noncompliance. He has untreated hepatitis C. He has not drink alcohol lately I will refer him back to infectious disease clinic for possible treatment for hepatitis C  Alcoholic cirrhosis of liver with ascites [facility]) The patient is asymptomatic. I recommend close follow-up with  liver specialist for close monitoring. His recent liver biopsy was negative for hepatoma  Hernia, inguinal, right The patient had visible large right inguinal hernia. Due to his liver disease and chronic thrombocytopenia, he is high risk for surgery. I will refer him to general surgery for evaluation If surgery is deemed feasible, I recommend perioperative platelet transfusion  Protein-calorie malnutrition, severe ([place]) This is related to multiple comorbidities. His liver disease appear clinically stable for now. He has not lost any weight. Recommend observation only for now  Orders Placed This Encounter  Procedures  . Ferritin    Standing Status:   Standing    Number of Occurrences:   22    Standing Expiration Date:   01/03/2017  . CBC & Diff and Retic    Standing Status:   Standing    Number of Occurrences:   22    Standing Expiration Date:    01/03/2017  . Ambulatory referral to General Surgery    Referral Priority:   Routine    Referral Type:   Surgical    Referral Reason:   Specialty Services Required    Requested Specialty:   General Surgery    Number of Visits Requested:   1  . Ambulatory referral to Gastroenterology    Referral Priority:   Routine    Referral Type:   Consultation    Referral Reason:   Specialty Services Required    Number of Visits Requested:   1  . Ambulatory referral to Infectious Disease    Referral Priority:   Routine    Referral Type:   Consultation    Referral Reason:   Specialty Services Required    Requested Specialty:   Infectious Diseases    Number of Visits Requested:   1    All questions were answered. The patient knows to call the clinic with any problems, questions or concerns. No barriers to learning was detected.  I spent 25 minutes counseling the patient face to face. The total time spent in the appointment was 40 minutes and more than 50% was on counseling.     Joliyah Lippens, MD 9/9/20171:40 PM

## 2016-01-05 NOTE — Assessment & Plan Note (Signed)
The patient is asymptomatic. I recommend close follow-up with liver specialist for close monitoring. His recent liver biopsy was negative for hepatoma

## 2016-01-05 NOTE — Assessment & Plan Note (Signed)
The patient had visible large right inguinal hernia. Due to his liver disease and chronic thrombocytopenia, he is high risk for surgery. I will refer him to general surgery for evaluation If surgery is deemed feasible, I recommend perioperative platelet transfusion

## 2016-01-05 NOTE — Assessment & Plan Note (Signed)
This is related to multiple comorbidities. His liver disease appear clinically stable for now. He has not lost any weight. Recommend observation only for now

## 2016-01-05 NOTE — Assessment & Plan Note (Signed)
The thrombocytopenia is related to untreated hepatitis C and liver disease along with alcoholism. He is not bleeding and does not require further treatment. I will see him back in 6 months for further follow-up

## 2016-01-05 NOTE — Assessment & Plan Note (Signed)
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 I recommend phlebotomy 1 unit of blood every other week for the next 3 months, goal for ferritin less than 250  I discussed the risk, benefits, side effects of phlebotomy and he agreed to proceed

## 2016-01-05 NOTE — Assessment & Plan Note (Signed)
The patient have history of noncompliance. He has untreated hepatitis C. He has not drink alcohol lately I will refer him back to infectious disease clinic for possible treatment for hepatitis C

## 2016-01-07 ENCOUNTER — Telehealth: Payer: Self-pay | Admitting: Hematology and Oncology

## 2016-01-07 ENCOUNTER — Telehealth: Payer: Self-pay | Admitting: *Deleted

## 2016-01-07 NOTE — Telephone Encounter (Signed)
Message sent to chemo scheduler to add ohlebotomy as per 01/04/16 los

## 2016-01-07 NOTE — Telephone Encounter (Signed)
Per LOS I have scheduled appts. Notified the scheduler

## 2016-01-11 ENCOUNTER — Other Ambulatory Visit (HOSPITAL_BASED_OUTPATIENT_CLINIC_OR_DEPARTMENT_OTHER): Payer: Medicaid Other

## 2016-01-11 ENCOUNTER — Ambulatory Visit (HOSPITAL_BASED_OUTPATIENT_CLINIC_OR_DEPARTMENT_OTHER): Payer: Medicaid Other

## 2016-01-11 LAB — CBC & DIFF AND RETIC
BASO%: 0.9 % (ref 0.0–2.0)
Basophils Absolute: 0 10*3/uL (ref 0.0–0.1)
EOS%: 1.8 % (ref 0.0–7.0)
Eosinophils Absolute: 0.1 10*3/uL (ref 0.0–0.5)
HCT: 36.2 % — ABNORMAL LOW (ref 38.4–49.9)
HGB: 12.9 g/dL — ABNORMAL LOW (ref 13.0–17.1)
IMMATURE RETIC FRACT: 0.9 % — AB (ref 3.00–10.60)
LYMPH#: 0.7 10*3/uL — AB (ref 0.9–3.3)
LYMPH%: 16.9 % (ref 14.0–49.0)
MCH: 36.5 pg — ABNORMAL HIGH (ref 27.2–33.4)
MCHC: 35.6 g/dL (ref 32.0–36.0)
MCV: 102.5 fL — ABNORMAL HIGH (ref 79.3–98.0)
MONO#: 0.6 10*3/uL (ref 0.1–0.9)
MONO%: 13.7 % (ref 0.0–14.0)
NEUT%: 66.7 % (ref 39.0–75.0)
NEUTROS ABS: 2.9 10*3/uL (ref 1.5–6.5)
PLATELETS: 50 10*3/uL — AB (ref 140–400)
RBC: 3.53 10*6/uL — AB (ref 4.20–5.82)
RDW: 13.8 % (ref 11.0–14.6)
RETIC %: 2.14 % — AB (ref 0.80–1.80)
RETIC CT ABS: 75.54 10*3/uL (ref 34.80–93.90)
WBC: 4.4 10*3/uL (ref 4.0–10.3)

## 2016-01-11 LAB — FERRITIN

## 2016-01-11 NOTE — Patient Instructions (Signed)

## 2016-01-14 MED FILL — spIRONOLACTONE 100 MG TAB: 100 | 30 days supply | Qty: 15 | Fill #1

## 2016-01-14 MED FILL — FUROSEMIDE 40 MG TABLET: 40 | 30 days supply | Qty: 60 | Fill #1

## 2016-01-15 MED FILL — GENERLAC 10 GM/15 ML SOLN: 10 | 30 days supply | Qty: 4050 | Fill #0

## 2016-01-25 ENCOUNTER — Ambulatory Visit (HOSPITAL_BASED_OUTPATIENT_CLINIC_OR_DEPARTMENT_OTHER): Payer: Medicaid Other

## 2016-01-25 ENCOUNTER — Other Ambulatory Visit (HOSPITAL_BASED_OUTPATIENT_CLINIC_OR_DEPARTMENT_OTHER): Payer: Medicaid Other

## 2016-01-25 LAB — CBC & DIFF AND RETIC
BASO%: 0.8 % (ref 0.0–2.0)
Basophils Absolute: 0 10*3/uL (ref 0.0–0.1)
EOS%: 2.2 % (ref 0.0–7.0)
Eosinophils Absolute: 0.1 10*3/uL (ref 0.0–0.5)
HCT: 38.3 % — ABNORMAL LOW (ref 38.4–49.9)
HGB: 13.3 g/dL (ref 13.0–17.1)
IMMATURE RETIC FRACT: 0.9 % — AB (ref 3.00–10.60)
LYMPH%: 20.1 % (ref 14.0–49.0)
MCH: 36.7 pg — AB (ref 27.2–33.4)
MCHC: 34.7 g/dL (ref 32.0–36.0)
MCV: 105.8 fL — AB (ref 79.3–98.0)
MONO#: 0.6 10*3/uL (ref 0.1–0.9)
MONO%: 16 % — AB (ref 0.0–14.0)
NEUT%: 60.9 % (ref 39.0–75.0)
NEUTROS ABS: 2.2 10*3/uL (ref 1.5–6.5)
Platelets: 45 10*3/uL — ABNORMAL LOW (ref 140–400)
RBC: 3.62 10*6/uL — AB (ref 4.20–5.82)
RDW: 14.1 % (ref 11.0–14.6)
RETIC %: 3.05 % — AB (ref 0.80–1.80)
Retic Ct Abs: 110.41 10*3/uL — ABNORMAL HIGH (ref 34.80–93.90)
WBC: 3.6 10*3/uL — AB (ref 4.0–10.3)
lymph#: 0.7 10*3/uL — ABNORMAL LOW (ref 0.9–3.3)

## 2016-01-25 LAB — FERRITIN

## 2016-01-25 NOTE — Progress Notes (Signed)
Per Dr Alvy Bimler Therapeutic phlebotomy to be performed unless Hemoglobin less than 10 (goal is ferritin less than 250). Give regardless of platelet count. Therapeutic phlebotomy performed per MD order using 16 gauge phlebotomy set in right [facility] (one unsucessful attempt in left [facility]) starting at 1119 and ending at 1126 removing a total of 665 grams of blood. Pt tolerated procedure well. Pt given a snack and drink and monitored for 30 minutes. Pt and VS stable at time of discharge.

## 2016-01-25 NOTE — Patient Instructions (Signed)

## 2016-02-04 ENCOUNTER — Other Ambulatory Visit: Payer: Self-pay | Admitting: Physician Assistant

## 2016-02-04 DIAGNOSIS — M7989 Other specified soft tissue disorders: Secondary | ICD-10-CM

## 2016-02-04 DIAGNOSIS — M79604 Pain in right leg: Secondary | ICD-10-CM

## 2016-02-04 MED FILL — CEPHALEXIN 500 MG CAPSULE: 500 | 14 days supply | Qty: 28 | Fill #0

## 2016-02-08 ENCOUNTER — Other Ambulatory Visit (HOSPITAL_BASED_OUTPATIENT_CLINIC_OR_DEPARTMENT_OTHER): Payer: Medicaid Other

## 2016-02-08 ENCOUNTER — Ambulatory Visit (HOSPITAL_BASED_OUTPATIENT_CLINIC_OR_DEPARTMENT_OTHER): Payer: Medicaid Other

## 2016-02-08 LAB — FERRITIN: FERRITIN: 833 ng/mL — AB (ref 22–316)

## 2016-02-08 LAB — CBC & DIFF AND RETIC
BASO%: 0.5 % (ref 0.0–2.0)
Basophils Absolute: 0 10*3/uL (ref 0.0–0.1)
EOS ABS: 0.1 10*3/uL (ref 0.0–0.5)
EOS%: 1.9 % (ref 0.0–7.0)
HCT: 37.9 % — ABNORMAL LOW (ref 38.4–49.9)
HEMOGLOBIN: 13.2 g/dL (ref 13.0–17.1)
Immature Retic Fract: 2.4 % — ABNORMAL LOW (ref 3.00–10.60)
LYMPH%: 23.4 % (ref 14.0–49.0)
MCH: 37.3 pg — AB (ref 27.2–33.4)
MCHC: 34.8 g/dL (ref 32.0–36.0)
MCV: 107.1 fL — AB (ref 79.3–98.0)
MONO#: 0.7 10*3/uL (ref 0.1–0.9)
MONO%: 18.1 % — AB (ref 0.0–14.0)
NEUT%: 56.1 % (ref 39.0–75.0)
NEUTROS ABS: 2 10*3/uL (ref 1.5–6.5)
Platelets: 48 10*3/uL — ABNORMAL LOW (ref 140–400)
RBC: 3.54 10*6/uL — ABNORMAL LOW (ref 4.20–5.82)
RDW: 14.5 % (ref 11.0–14.6)
Retic %: 4.16 % — ABNORMAL HIGH (ref 0.80–1.80)
Retic Ct Abs: 147.26 10*3/uL — ABNORMAL HIGH (ref 34.80–93.90)
WBC: 3.6 10*3/uL — AB (ref 4.0–10.3)
lymph#: 0.9 10*3/uL (ref 0.9–3.3)

## 2016-02-08 MED ORDER — ACETAMINOPHEN 325 MG PO TABS
ORAL_TABLET | ORAL | Status: AC
  Filled 2016-02-08: qty 2

## 2016-02-08 MED ORDER — ACETAMINOPHEN 325 MG PO TABS
650.0000 mg | ORAL_TABLET | Freq: Once | ORAL | Status: AC
  Administered 2016-02-08: 650 mg via ORAL

## 2016-02-08 NOTE — Patient Instructions (Signed)

## 2016-02-08 NOTE — Progress Notes (Signed)
Phlebotomy. 500g obtained  from patient with  Using a 16g standard phlebotomy set from  Right antecubital AC. Procedure started at 1113 and ended at 1130. Patient given snack and encouraged fluids. Observed for 30 minutes afterwards. Vital Signs stable. No complaints.

## 2016-02-08 NOTE — Progress Notes (Signed)
Patient complaining of right hand cramping. Patient states he takes tylenol for pain. Selena Lesser, NP notified. Order given and carried out for Tylenol 650 mg PO. Patient verbalized understanding to above mentioned plan.

## 2016-02-11 ENCOUNTER — Ambulatory Visit
Admission: RE | Admit: 2016-02-11 | Discharge: 2016-02-11 | Disposition: A | Discharge status: Home / Self Care | Payer: Medicaid Other | Source: Ambulatory Visit | Attending: Physician Assistant | Admitting: Physician Assistant

## 2016-02-11 DIAGNOSIS — M7989 Other specified soft tissue disorders: Secondary | ICD-10-CM

## 2016-02-11 DIAGNOSIS — M79604 Pain in right leg: Secondary | ICD-10-CM

## 2016-02-21 MED FILL — FUROSEMIDE 40 MG TABLET: 40 | 30 days supply | Qty: 90 | Fill #0

## 2016-02-22 ENCOUNTER — Ambulatory Visit (HOSPITAL_BASED_OUTPATIENT_CLINIC_OR_DEPARTMENT_OTHER): Payer: Medicaid Other

## 2016-02-22 ENCOUNTER — Other Ambulatory Visit (HOSPITAL_BASED_OUTPATIENT_CLINIC_OR_DEPARTMENT_OTHER): Payer: Medicaid Other

## 2016-02-22 LAB — CBC & DIFF AND RETIC
BASO%: 1 % (ref 0.0–2.0)
Basophils Absolute: 0 10*3/uL (ref 0.0–0.1)
EOS ABS: 0.1 10*3/uL (ref 0.0–0.5)
EOS%: 2.2 % (ref 0.0–7.0)
HCT: 38.5 % (ref 38.4–49.9)
HGB: 13.4 g/dL (ref 13.0–17.1)
IMMATURE RETIC FRACT: 2.5 % — AB (ref 3.00–10.60)
LYMPH%: 20.5 % (ref 14.0–49.0)
MCH: 37.2 pg — AB (ref 27.2–33.4)
MCHC: 34.8 g/dL (ref 32.0–36.0)
MCV: 106.9 fL — AB (ref 79.3–98.0)
MONO#: 0.7 10*3/uL (ref 0.1–0.9)
MONO%: 21.2 % — AB (ref 0.0–14.0)
NEUT%: 55.1 % (ref 39.0–75.0)
NEUTROS ABS: 1.7 10*3/uL (ref 1.5–6.5)
NRBC: 0 % (ref 0–0)
PLATELETS: 48 10*3/uL — AB (ref 140–400)
RBC: 3.6 10*6/uL — AB (ref 4.20–5.82)
RDW: 14.2 % (ref 11.0–14.6)
Retic %: 3.34 % — ABNORMAL HIGH (ref 0.80–1.80)
Retic Ct Abs: 120.24 10*3/uL — ABNORMAL HIGH (ref 34.80–93.90)
WBC: 3.1 10*3/uL — AB (ref 4.0–10.3)
lymph#: 0.6 10*3/uL — ABNORMAL LOW (ref 0.9–3.3)

## 2016-02-22 LAB — FERRITIN: FERRITIN: 693 ng/mL — AB (ref 22–316)

## 2016-02-22 NOTE — Patient Instructions (Signed)

## 2016-02-22 NOTE — Progress Notes (Signed)
Ferritin two weeks ago 833, repeated today proceeded pending results.  Started at 1058, 16 G phlebotomy kit to right ac, volume removed 505 ml stopped at 1110.  VSS prior to treatment normal,  Nourishment offered and accepted.  Remaining for post vss.

## 2016-02-25 ENCOUNTER — Other Ambulatory Visit: Payer: Self-pay | Admitting: Adult Health

## 2016-03-07 ENCOUNTER — Ambulatory Visit (HOSPITAL_BASED_OUTPATIENT_CLINIC_OR_DEPARTMENT_OTHER): Payer: Medicaid Other

## 2016-03-07 ENCOUNTER — Other Ambulatory Visit (HOSPITAL_BASED_OUTPATIENT_CLINIC_OR_DEPARTMENT_OTHER): Payer: Medicaid Other

## 2016-03-07 LAB — CBC & DIFF AND RETIC
BASO%: 0.7 % (ref 0.0–2.0)
BASOS ABS: 0 10*3/uL (ref 0.0–0.1)
EOS%: 1.2 % (ref 0.0–7.0)
Eosinophils Absolute: 0.1 10*3/uL (ref 0.0–0.5)
HEMATOCRIT: 35.9 % — AB (ref 38.4–49.9)
HGB: 12.5 g/dL — ABNORMAL LOW (ref 13.0–17.1)
Immature Retic Fract: 2.9 % — ABNORMAL LOW (ref 3.00–10.60)
LYMPH%: 21.5 % (ref 14.0–49.0)
MCH: 37.2 pg — AB (ref 27.2–33.4)
MCHC: 34.8 g/dL (ref 32.0–36.0)
MCV: 106.8 fL — ABNORMAL HIGH (ref 79.3–98.0)
MONO#: 0.7 10*3/uL (ref 0.1–0.9)
MONO%: 15.9 % — AB (ref 0.0–14.0)
NEUT#: 2.6 10*3/uL (ref 1.5–6.5)
NEUT%: 60.7 % (ref 39.0–75.0)
PLATELETS: 45 10*3/uL — AB (ref 140–400)
RBC: 3.36 10*6/uL — ABNORMAL LOW (ref 4.20–5.82)
RDW: 13.8 % (ref 11.0–14.6)
Retic %: 2.78 % — ABNORMAL HIGH (ref 0.80–1.80)
Retic Ct Abs: 93.41 10*3/uL (ref 34.80–93.90)
WBC: 4.3 10*3/uL (ref 4.0–10.3)
lymph#: 0.9 10*3/uL (ref 0.9–3.3)

## 2016-03-07 LAB — FERRITIN: Ferritin: 625 ng/ml — ABNORMAL HIGH (ref 22–316)

## 2016-03-07 NOTE — Patient Instructions (Signed)

## 2016-03-21 ENCOUNTER — Ambulatory Visit (HOSPITAL_BASED_OUTPATIENT_CLINIC_OR_DEPARTMENT_OTHER): Payer: Medicaid Other

## 2016-03-21 ENCOUNTER — Other Ambulatory Visit (HOSPITAL_BASED_OUTPATIENT_CLINIC_OR_DEPARTMENT_OTHER): Payer: Medicaid Other

## 2016-03-21 LAB — CBC & DIFF AND RETIC
BASO%: 0.5 % (ref 0.0–2.0)
Basophils Absolute: 0 10*3/uL (ref 0.0–0.1)
EOS ABS: 0.1 10*3/uL (ref 0.0–0.5)
EOS%: 1.4 % (ref 0.0–7.0)
HCT: 39.7 % (ref 38.4–49.9)
HGB: 13.6 g/dL (ref 13.0–17.1)
Immature Retic Fract: 2 % — ABNORMAL LOW (ref 3.00–10.60)
LYMPH%: 20.3 % (ref 14.0–49.0)
MCH: 36.7 pg — AB (ref 27.2–33.4)
MCHC: 34.3 g/dL (ref 32.0–36.0)
MCV: 107 fL — ABNORMAL HIGH (ref 79.3–98.0)
MONO#: 0.8 10*3/uL (ref 0.1–0.9)
MONO%: 22.5 % — AB (ref 0.0–14.0)
NEUT%: 55.3 % (ref 39.0–75.0)
NEUTROS ABS: 2 10*3/uL (ref 1.5–6.5)
Platelets: 48 10*3/uL — ABNORMAL LOW (ref 140–400)
RBC: 3.71 10*6/uL — AB (ref 4.20–5.82)
RDW: 14 % (ref 11.0–14.6)
RETIC %: 2.28 % — AB (ref 0.80–1.80)
Retic Ct Abs: 84.59 10*3/uL (ref 34.80–93.90)
WBC: 3.7 10*3/uL — AB (ref 4.0–10.3)
lymph#: 0.8 10*3/uL — ABNORMAL LOW (ref 0.9–3.3)

## 2016-03-21 LAB — FERRITIN: Ferritin: 515 ng/ml — ABNORMAL HIGH (ref 22–316)

## 2016-03-21 NOTE — Progress Notes (Signed)
Phlebotomy done to (R)AC with kit. 520 ml withdrawn. Patient observed for 30 mins. Given snack/drink. Patient discharged in stable condition.

## 2016-03-21 NOTE — Progress Notes (Signed)
Pt and VS stable at discharge. 

## 2016-03-21 NOTE — Patient Instructions (Signed)
Therapeutic Phlebotomy Therapeutic phlebotomy is the controlled removal of blood from a person's body for the purpose of treating a medical condition. The procedure is similar to donating blood. Usually, about a pint (470 mL, or 0.47L) of blood is removed. The average adult has 9-12 pints (4.3-5.7 L) of blood. Therapeutic phlebotomy may be used to treat the following medical conditions:  Hemochromatosis. This is a condition in which the blood contains too much iron.  Polycythemia vera. This is a condition in which the blood contains too many red blood cells.  Porphyria cutanea tarda. This is a disease in which an important part of hemoglobin is not made properly. It results in the buildup of abnormal amounts of porphyrins in the body.  Sickle cell disease. This is a condition in which the red blood cells form an abnormal crescent shape rather than a round shape. Tell a health care provider about:  Any allergies you have.  All medicines you are taking, including vitamins, herbs, eye drops, creams, and over-the-counter medicines.  Any problems you or family members have had with anesthetic medicines.  Any blood disorders you have.  Any surgeries you have had.  Any medical conditions you have. What are the risks? Generally, this is a safe procedure. However, problems may occur, including:  Nausea or light-headedness.  Low blood pressure.  Soreness, bleeding, swelling, or bruising at the needle insertion site.  Infection. What happens before the procedure?  Follow instructions from your health care provider about eating or drinking restrictions.  Ask your health care provider about changing or stopping your regular medicines. This is especially important if you are taking diabetes medicines or blood thinners.  Wear clothing with sleeves that can be raised above the elbow.  Plan to have someone take you home after the procedure.  You may have a blood sample taken. What happens  during the procedure?  A needle will be inserted into one of your veins.  Tubing and a collection bag will be attached to that needle.  Blood will flow through the needle and tubing into the collection bag.  You may be asked to open and close your hand slowly and continually during the entire collection.  After the specified amount of blood has been removed from your body, the collection bag and tubing will be clamped.  The needle will be removed from your vein.  Pressure will be held on the site of the needle insertion to stop the bleeding.  A bandage (dressing) will be placed over the needle insertion site. The procedure may vary among health care providers and hospitals. What happens after the procedure?  Your recovery will be assessed and monitored.  You can return to your normal activities as directed by your health care provider. This information is not intended to replace advice given to you by your health care provider. Make sure you discuss any questions you have with your health care provider. Document Released: 09/16/2010 Document Revised: 12/15/2015 Document Reviewed: 04/10/2014 Elsevier Interactive Patient Education  2017 [employer]vier Inc.  

## 2016-04-04 ENCOUNTER — Encounter: Payer: Self-pay | Admitting: Hematology and Oncology

## 2016-04-04 ENCOUNTER — Other Ambulatory Visit (HOSPITAL_BASED_OUTPATIENT_CLINIC_OR_DEPARTMENT_OTHER): Payer: Medicaid Other

## 2016-04-04 ENCOUNTER — Ambulatory Visit (HOSPITAL_BASED_OUTPATIENT_CLINIC_OR_DEPARTMENT_OTHER): Payer: Medicaid Other

## 2016-04-04 ENCOUNTER — Ambulatory Visit (HOSPITAL_BASED_OUTPATIENT_CLINIC_OR_DEPARTMENT_OTHER): Payer: Medicaid Other | Admitting: Hematology and Oncology

## 2016-04-04 VITALS — BP 122/71 | HR 55 | Temp 98.0°F | Resp 17 | Ht 66.0 in | Wt 187.9 lb

## 2016-04-04 DIAGNOSIS — B1920 Unspecified viral hepatitis C without hepatic coma: Secondary | ICD-10-CM | POA: Diagnosis not present

## 2016-04-04 DIAGNOSIS — D696 Thrombocytopenia, unspecified: Secondary | ICD-10-CM | POA: Diagnosis not present

## 2016-04-04 DIAGNOSIS — F1020 Alcohol dependence, uncomplicated: Secondary | ICD-10-CM

## 2016-04-04 DIAGNOSIS — K709 Alcoholic liver disease, unspecified: Secondary | ICD-10-CM | POA: Diagnosis not present

## 2016-04-04 LAB — CBC & DIFF AND RETIC
BASO%: 0.9 % (ref 0.0–2.0)
BASOS ABS: 0.1 10*3/uL (ref 0.0–0.1)
EOS ABS: 0.1 10*3/uL (ref 0.0–0.5)
EOS%: 1.2 % (ref 0.0–7.0)
HEMATOCRIT: 41.4 % (ref 38.4–49.9)
HEMOGLOBIN: 14.1 g/dL (ref 13.0–17.1)
IMMATURE RETIC FRACT: 2.4 % — AB (ref 3.00–10.60)
LYMPH%: 16.4 % (ref 14.0–49.0)
MCH: 36.4 pg — AB (ref 27.2–33.4)
MCHC: 34.1 g/dL (ref 32.0–36.0)
MCV: 107 fL — ABNORMAL HIGH (ref 79.3–98.0)
MONO#: 1.1 10*3/uL — AB (ref 0.1–0.9)
MONO%: 19.4 % — AB (ref 0.0–14.0)
NEUT%: 62.1 % (ref 39.0–75.0)
NEUTROS ABS: 3.6 10*3/uL (ref 1.5–6.5)
Platelets: 52 10*3/uL — ABNORMAL LOW (ref 140–400)
RBC: 3.87 10*6/uL — ABNORMAL LOW (ref 4.20–5.82)
RDW: 13.9 % (ref 11.0–14.6)
RETIC %: 2.57 % — AB (ref 0.80–1.80)
Retic Ct Abs: 99.46 10*3/uL — ABNORMAL HIGH (ref 34.80–93.90)
WBC: 5.7 10*3/uL (ref 4.0–10.3)
lymph#: 0.9 10*3/uL (ref 0.9–3.3)

## 2016-04-04 LAB — FERRITIN: Ferritin: 458 ng/ml — ABNORMAL HIGH (ref 22–316)

## 2016-04-04 NOTE — Assessment & Plan Note (Addendum)
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 He appears to be responding well to phlebotomy. I plan to space out his treatment to every 4 weeks I discussed the risk, benefits, side effects of phlebotomy and he agreed to proceed  The goal would be to keep ferritin level under 200.

## 2016-04-04 NOTE — Progress Notes (Signed)
Pt had 453 gm removed using standard phlebotomy kit with 16 gauge needle. Pt tolerated procedure well. Remained for 30 minutes post procedure, had a sandwich and drink. Vitals signs stable. Ambulated well upon discharge.

## 2016-04-04 NOTE — Assessment & Plan Note (Signed)
The thrombocytopenia is related to untreated hepatitis C and liver disease along with alcoholism. He is not bleeding and does not require further treatment.

## 2016-04-04 NOTE — Assessment & Plan Note (Signed)
The patient have untreated hepatitis C due to noncompliance and alcoholism. I spoke with the patient extensively about staying abstinent and to stop drinking

## 2016-04-04 NOTE — Patient Instructions (Signed)
Therapeutic Phlebotomy Therapeutic phlebotomy is the controlled removal of blood from a person's body for the purpose of treating a medical condition. The procedure is similar to donating blood. Usually, about a pint (470 mL, or 0.47L) of blood is removed. The average adult has 9-12 pints (4.3-5.7 L) of blood. Therapeutic phlebotomy may be used to treat the following medical conditions:  Hemochromatosis. This is a condition in which the blood contains too much iron.  Polycythemia vera. This is a condition in which the blood contains too many red blood cells.  Porphyria cutanea tarda. This is a disease in which an important part of hemoglobin is not made properly. It results in the buildup of abnormal amounts of porphyrins in the body.  Sickle cell disease. This is a condition in which the red blood cells form an abnormal crescent shape rather than a round shape. Tell a health care provider about:  Any allergies you have.  All medicines you are taking, including vitamins, herbs, eye drops, creams, and over-the-counter medicines.  Any problems you or family members have had with anesthetic medicines.  Any blood disorders you have.  Any surgeries you have had.  Any medical conditions you have. What are the risks? Generally, this is a safe procedure. However, problems may occur, including:  Nausea or light-headedness.  Low blood pressure.  Soreness, bleeding, swelling, or bruising at the needle insertion site.  Infection. What happens before the procedure?  Follow instructions from your health care provider about eating or drinking restrictions.  Ask your health care provider about changing or stopping your regular medicines. This is especially important if you are taking diabetes medicines or blood thinners.  Wear clothing with sleeves that can be raised above the elbow.  Plan to have someone take you home after the procedure.  You may have a blood sample taken. What happens  during the procedure?  A needle will be inserted into one of your veins.  Tubing and a collection bag will be attached to that needle.  Blood will flow through the needle and tubing into the collection bag.  You may be asked to open and close your hand slowly and continually during the entire collection.  After the specified amount of blood has been removed from your body, the collection bag and tubing will be clamped.  The needle will be removed from your vein.  Pressure will be held on the site of the needle insertion to stop the bleeding.  A bandage (dressing) will be placed over the needle insertion site. The procedure may vary among health care providers and hospitals. What happens after the procedure?  Your recovery will be assessed and monitored.  You can return to your normal activities as directed by your health care provider. This information is not intended to replace advice given to you by your health care provider. Make sure you discuss any questions you have with your health care provider. Document Released: 09/16/2010 Document Revised: 12/15/2015 Document Reviewed: 04/10/2014 Elsevier Interactive Patient Education  2017 [employer]vier Inc.  

## 2016-04-04 NOTE — Progress Notes (Signed)
[facility] OFFICE PROGRESS NOTE  Carmie Kanner, NP SUMMARY OF HEMATOLOGIC HISTORY:  LEELAND BARB 60 y.o. male is here because of thrombocytopenia.  He was found to have abnormal CBC from recent blood work. This patient have chronic untreated hepatitis C, liver disease with cirrhosis and chronic alcoholism. He was recently discharged from the hospital with acute encephalopathy which improved upon supportive care management. He denies recent bruising/bleeding, such as spontaneous epistaxis, hematuria, melena or hematochezia The patient had poor social circumstances. He lives in a group home/facility. He continues to drink on a regular basis, last alcohol intake was over a day ago. He has not been treated for hepatitis C, presumably due to noncompliance and chronic alcoholism. He has chronic right lower abdominal pain from abdominal hernia but that was not surgically repaired due to chronic thrombocytopenia In May, he had extensive workup including liver biopsy which excluded hepatoma Starting September 2017, he had regular phlebotomy every 3 weeks INTERVAL HISTORY: YANSEL BENTZ 60 y.o. male returns for follow-up. He tolerated phlebotomy well. He continues to drink sporadically. He denies recent infection. The patient denies any recent signs or symptoms of bleeding such as spontaneous epistaxis, hematuria or hematochezia.  I have reviewed the past medical history, past surgical history, social history and family history with the patient and they are unchanged from previous note.  ALLERGIES:  has No Known Allergies.  MEDICATIONS:  Current Outpatient Prescriptions  Medication Sig Dispense Refill  . folic acid (FOLVITE) 1 MG tablet Take 1 tablet (1 mg total) by mouth daily.    . furosemide (LASIX) 40 MG tablet Take 1 tablet (40 mg total) by mouth 2 (two) times daily. 90 tablet 3  . rifaximin (XIFAXAN) 550 MG TABS tablet Take 1 tablet (550 mg total) by mouth 2 (two) times  daily. 90 tablet prn  . KLOR-CON M20 20 MEQ tablet Take 20 mEq by mouth 2 (two) times daily.  0  . lactulose (CHRONULAC) 10 GM/15ML solution Take 45 mLs (30 g total) by mouth 3 (three) times daily. (Patient not taking: Reported on 04/04/2016) 240 mL 0  . magnesium oxide (MAG-OX) 400 (241.3 Mg) MG tablet Take 1 tablet (400 mg total) by mouth daily. (Patient not taking: Reported on 04/04/2016) 30 tablet 0  . methocarbamol (ROBAXIN) 500 MG tablet Take 1 tablet (500 mg total) by mouth 2 (two) times daily. (Patient not taking: Reported on 04/04/2016) 20 tablet 0  . Multiple Vitamin (MULTIVITAMIN) tablet Take 1 tablet by mouth daily.    Marland Kitchen omeprazole (PRILOSEC) 20 MG capsule Take 20 mg by mouth daily.    Marland Kitchen spironolactone (ALDACTONE) 100 MG tablet Take 0.5 tablets (50 mg total) by mouth daily. 30 tablet 3  . thiamine 100 MG tablet Take 1 tablet (100 mg total) by mouth daily.     No current facility-administered medications for this visit.      REVIEW OF SYSTEMS:   Constitutional: Denies fevers, chills or night sweats Eyes: Denies blurriness of vision Ears, nose, mouth, throat, and face: Denies mucositis or sore throat Respiratory: Denies cough, dyspnea or wheezes Cardiovascular: Denies palpitation, chest discomfort or lower extremity swelling Gastrointestinal:  Denies nausea, heartburn or change in bowel habits Skin: Denies abnormal skin rashes Lymphatics: Denies new lymphadenopathy or easy bruising Neurological:Denies numbness, tingling or new weaknesses Behavioral/Psych: Mood is stable, no new changes  All other systems were reviewed with the patient and are negative.  PHYSICAL EXAMINATION: ECOG PERFORMANCE STATUS: 0 - Asymptomatic  Vitals:  04/04/16 0946  BP: 122/71  Pulse: (!) 55  Resp: 17  Temp: 98 F (36.7 C)   Filed Weights   04/04/16 0946  Weight: 187 lb 14.4 oz (85.2 kg)    GENERAL:alert, no distress and comfortable SKIN: skin color, texture, turgor are normal, no rashes or  significant lesions EYES: normal, Conjunctiva are pink and non-injected, sclera clear Musculoskeletal:no cyanosis of digits and no clubbing  NEURO: alert & oriented x 3 with fluent speech, no focal motor/sensory deficits  LABORATORY DATA:  I have reviewed the data as listed     Component Value Date/Time   NA 137 01/04/2016 0809   K 4.5 01/04/2016 0809   CL 102 09/07/2015 0648   CO2 18 (L) 01/04/2016 0809   GLUCOSE 109 01/04/2016 0809   BUN 14.5 01/04/2016 0809   CREATININE 1.0 01/04/2016 0809   CALCIUM 8.3 (L) 01/04/2016 0809   PROT 6.8 01/04/2016 0809   ALBUMIN 2.3 (L) 01/04/2016 0809   AST 98 (H) 01/04/2016 0809   ALT 52 01/04/2016 0809   ALKPHOS 231 (H) 01/04/2016 0809   BILITOT 2.10 (H) 01/04/2016 0809   GFRNONAA >60 09/07/2015 0648   GFRNONAA >89 10/13/2013 1156   GFRAA >60 09/07/2015 0648   GFRAA >89 10/13/2013 1156    No results found for: SPEP, UPEP  Lab Results  Component Value Date   WBC 5.7 04/04/2016   NEUTROABS 3.6 04/04/2016   HGB 14.1 04/04/2016   HCT 41.4 04/04/2016   MCV 107.0 (H) 04/04/2016   PLT 52 (L) 04/04/2016      Chemistry      Component Value Date/Time   NA 137 01/04/2016 0809   K 4.5 01/04/2016 0809   CL 102 09/07/2015 0648   CO2 18 (L) 01/04/2016 0809   BUN 14.5 01/04/2016 0809   CREATININE 1.0 01/04/2016 0809   GLU 117 09/12/2015      Component Value Date/Time   CALCIUM 8.3 (L) 01/04/2016 0809   ALKPHOS 231 (H) 01/04/2016 0809   AST 98 (H) 01/04/2016 0809   ALT 52 01/04/2016 0809   BILITOT 2.10 (H) 01/04/2016 0809      ASSESSMENT & PLAN:  Hemochromatosis The patient has signs of iron overload and  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 He appears to be responding well to phlebotomy. I plan to space out his treatment to every 4 weeks I discussed the risk, benefits, side effects of phlebotomy and he agreed to proceed  The goal would be to keep ferritin level under 200.  Thrombocytopenia ([place]) The thrombocytopenia is  related to untreated hepatitis C and liver disease along with alcoholism. He is not bleeding and does not require further treatment.  Alcoholism ([place]) The patient have untreated hepatitis C due to noncompliance and alcoholism. I spoke with the patient extensively about staying abstinent and to stop drinking   No orders of the defined types were placed in this encounter.   All questions were answered. The patient knows to call the clinic with any problems, questions or concerns. No barriers to learning was detected.  I spent 15 minutes counseling the patient face to face. The total time spent in the appointment was 20 minutes and more than 50% was on counseling.     Heath Lark, MD 12/8/201712:15 PM

## 2016-04-17 MED FILL — TRIAMTERENE-HCTZ 37.5-25 MG: 37.5-25 | 30 days supply | Qty: 30 | Fill #0

## 2016-04-26 ENCOUNTER — Emergency Department (HOSPITAL_COMMUNITY): Payer: Medicaid Other

## 2016-04-26 ENCOUNTER — Encounter (HOSPITAL_COMMUNITY): Payer: Self-pay

## 2016-04-26 ENCOUNTER — Observation Stay (HOSPITAL_COMMUNITY): Payer: Medicaid Other

## 2016-04-26 ENCOUNTER — Inpatient Hospital Stay (HOSPITAL_COMMUNITY)
Admission: EM | Admit: 2016-04-26 | Discharge: 2016-04-28 | DRG: 442 | Disposition: A | Discharge status: Home / Self Care | Payer: Medicaid Other | Attending: Internal Medicine | Admitting: Internal Medicine

## 2016-04-26 DIAGNOSIS — R945 Abnormal results of liver function studies: Secondary | ICD-10-CM

## 2016-04-26 DIAGNOSIS — F17210 Nicotine dependence, cigarettes, uncomplicated: Secondary | ICD-10-CM | POA: Diagnosis present

## 2016-04-26 DIAGNOSIS — N179 Acute kidney failure, unspecified: Secondary | ICD-10-CM | POA: Diagnosis not present

## 2016-04-26 DIAGNOSIS — Z79899 Other long term (current) drug therapy: Secondary | ICD-10-CM

## 2016-04-26 DIAGNOSIS — K219 Gastro-esophageal reflux disease without esophagitis: Secondary | ICD-10-CM | POA: Diagnosis present

## 2016-04-26 DIAGNOSIS — K7290 Hepatic failure, unspecified without coma: Principal | ICD-10-CM | POA: Diagnosis present

## 2016-04-26 DIAGNOSIS — F10920 Alcohol use, unspecified with intoxication, uncomplicated: Secondary | ICD-10-CM

## 2016-04-26 DIAGNOSIS — I10 Essential (primary) hypertension: Secondary | ICD-10-CM | POA: Diagnosis present

## 2016-04-26 DIAGNOSIS — D696 Thrombocytopenia, unspecified: Secondary | ICD-10-CM | POA: Diagnosis present

## 2016-04-26 DIAGNOSIS — E876 Hypokalemia: Secondary | ICD-10-CM | POA: Diagnosis present

## 2016-04-26 DIAGNOSIS — B182 Chronic viral hepatitis C: Secondary | ICD-10-CM | POA: Diagnosis present

## 2016-04-26 DIAGNOSIS — K7200 Acute and subacute hepatic failure without coma: Secondary | ICD-10-CM | POA: Diagnosis present

## 2016-04-26 DIAGNOSIS — K7682 Hepatic encephalopathy: Secondary | ICD-10-CM | POA: Diagnosis present

## 2016-04-26 DIAGNOSIS — R7989 Other specified abnormal findings of blood chemistry: Secondary | ICD-10-CM | POA: Diagnosis not present

## 2016-04-26 DIAGNOSIS — K759 Inflammatory liver disease, unspecified: Secondary | ICD-10-CM

## 2016-04-26 DIAGNOSIS — F1021 Alcohol dependence, in remission: Secondary | ICD-10-CM | POA: Diagnosis present

## 2016-04-26 DIAGNOSIS — R001 Bradycardia, unspecified: Secondary | ICD-10-CM | POA: Diagnosis present

## 2016-04-26 DIAGNOSIS — K7460 Unspecified cirrhosis of liver: Secondary | ICD-10-CM | POA: Diagnosis present

## 2016-04-26 DIAGNOSIS — Z8249 Family history of ischemic heart disease and other diseases of the circulatory system: Secondary | ICD-10-CM

## 2016-04-26 LAB — COMPREHENSIVE METABOLIC PANEL
ALT: 61 U/L (ref 17–63)
ANION GAP: 9 (ref 5–15)
AST: 98 U/L — ABNORMAL HIGH (ref 15–41)
Albumin: 2.6 g/dL — ABNORMAL LOW (ref 3.5–5.0)
Alkaline Phosphatase: 187 U/L — ABNORMAL HIGH (ref 38–126)
BUN: 20 mg/dL (ref 6–20)
CALCIUM: 8.4 mg/dL — AB (ref 8.9–10.3)
CHLORIDE: 108 mmol/L (ref 101–111)
CO2: 19 mmol/L — AB (ref 22–32)
Creatinine, Ser: 1.32 mg/dL — ABNORMAL HIGH (ref 0.61–1.24)
GFR calc non Af Amer: 57 mL/min — ABNORMAL LOW (ref >60)
Glucose, Bld: 134 mg/dL — ABNORMAL HIGH (ref 65–99)
POTASSIUM: 3.3 mmol/L — AB (ref 3.5–5.1)
SODIUM: 136 mmol/L (ref 135–145)
Total Bilirubin: 1.5 mg/dL — ABNORMAL HIGH (ref 0.3–1.2)
Total Protein: 7.8 g/dL (ref 6.5–8.1)

## 2016-04-26 LAB — URINALYSIS, ROUTINE W REFLEX MICROSCOPIC
Bacteria, UA: NONE SEEN
Bilirubin Urine: NEGATIVE
GLUCOSE, UA: NEGATIVE mg/dL
Ketones, ur: NEGATIVE mg/dL
LEUKOCYTES UA: NEGATIVE
NITRITE: NEGATIVE
PROTEIN: NEGATIVE mg/dL
Specific Gravity, Urine: 1.011 (ref 1.005–1.030)
pH: 6 (ref 5.0–8.0)

## 2016-04-26 LAB — CBC WITH DIFFERENTIAL/PLATELET
Basophils Absolute: 0 10*3/uL (ref 0.0–0.1)
Basophils Relative: 1 %
EOS ABS: 0.1 10*3/uL (ref 0.0–0.7)
EOS PCT: 2 %
HCT: 38.5 % — ABNORMAL LOW (ref 39.0–52.0)
Hemoglobin: 13.7 g/dL (ref 13.0–17.0)
LYMPHS ABS: 1.2 10*3/uL (ref 0.7–4.0)
Lymphocytes Relative: 30 %
MCH: 35.9 pg — ABNORMAL HIGH (ref 26.0–34.0)
MCHC: 35.6 g/dL (ref 30.0–36.0)
MCV: 100.8 fL — ABNORMAL HIGH (ref 78.0–100.0)
MONOS PCT: 16 %
Monocytes Absolute: 0.6 10*3/uL (ref 0.1–1.0)
Neutro Abs: 2 10*3/uL (ref 1.7–7.7)
Neutrophils Relative %: 51 %
PLATELETS: 73 10*3/uL — AB (ref 150–400)
RBC: 3.82 MIL/uL — ABNORMAL LOW (ref 4.22–5.81)
RDW: 13.3 % (ref 11.5–15.5)
WBC: 4 10*3/uL (ref 4.0–10.5)

## 2016-04-26 LAB — AMMONIA: Ammonia: 119 umol/L — ABNORMAL HIGH (ref 9–35)

## 2016-04-26 LAB — RAPID URINE DRUG SCREEN, HOSP PERFORMED
AMPHETAMINES: NOT DETECTED
Amphetamines: NOT DETECTED
Barbiturates: NOT DETECTED
Barbiturates: NOT DETECTED
Benzodiazepines: NOT DETECTED
Benzodiazepines: NOT DETECTED
COCAINE: NOT DETECTED
Cocaine: NOT DETECTED
OPIATES: NOT DETECTED
Opiates: NOT DETECTED
TETRAHYDROCANNABINOL: NOT DETECTED
Tetrahydrocannabinol: NOT DETECTED

## 2016-04-26 LAB — ACETAMINOPHEN LEVEL

## 2016-04-26 LAB — MAGNESIUM: Magnesium: 2.2 mg/dL (ref 1.7–2.4)

## 2016-04-26 LAB — ETHANOL: Alcohol, Ethyl (B): <5 mg/dL (ref <5)

## 2016-04-26 LAB — SALICYLATE LEVEL: Salicylate Lvl: <7 mg/dL (ref 2.8–30.0)

## 2016-04-26 MED ORDER — FOLIC ACID 1 MG PO TABS
1.0000 mg | ORAL_TABLET | Freq: Every day | ORAL | Status: DC
  Administered 2016-04-26 – 2016-04-28 (×3): 1 mg via ORAL
  Filled 2016-04-26 (×3): qty 1

## 2016-04-26 MED ORDER — VITAMIN B-1 100 MG PO TABS
100.0000 mg | ORAL_TABLET | Freq: Every day | ORAL | Status: DC
  Administered 2016-04-26 – 2016-04-28 (×3): 100 mg via ORAL
  Filled 2016-04-26 (×3): qty 1

## 2016-04-26 MED ORDER — POTASSIUM CHLORIDE CRYS ER 20 MEQ PO TBCR
20.0000 meq | EXTENDED_RELEASE_TABLET | Freq: Two times a day (BID) | ORAL | Status: DC
  Administered 2016-04-26 – 2016-04-28 (×5): 20 meq via ORAL
  Filled 2016-04-26 (×5): qty 1

## 2016-04-26 MED ORDER — LACTULOSE 10 GM/15ML PO SOLN
20.0000 g | Freq: Three times a day (TID) | ORAL | Status: DC
  Administered 2016-04-26 – 2016-04-28 (×6): 20 g via ORAL
  Filled 2016-04-26 (×6): qty 30

## 2016-04-26 MED ORDER — MAGNESIUM OXIDE 400 (241.3 MG) MG PO TABS
400.0000 mg | ORAL_TABLET | Freq: Every day | ORAL | Status: DC
  Administered 2016-04-26 – 2016-04-28 (×3): 400 mg via ORAL
  Filled 2016-04-26 (×3): qty 1

## 2016-04-26 MED ORDER — ADULT MULTIVITAMIN W/MINERALS CH
1.0000 | ORAL_TABLET | Freq: Every day | ORAL | Status: DC
  Administered 2016-04-26 – 2016-04-28 (×3): 1 via ORAL
  Filled 2016-04-26 (×3): qty 1

## 2016-04-26 MED ORDER — SODIUM CHLORIDE 0.9% FLUSH: 3.0000 mL | INTRAVENOUS | Status: DC | PRN

## 2016-04-26 MED ORDER — SODIUM CHLORIDE 0.9 % IV SOLN
INTRAVENOUS | Status: DC
  Administered 2016-04-26: 14:00:00 via INTRAVENOUS

## 2016-04-26 MED ORDER — SODIUM CHLORIDE 0.9% FLUSH
3.0000 mL | Freq: Two times a day (BID) | INTRAVENOUS | Status: DC
  Administered 2016-04-27 (×2): 3 mL via INTRAVENOUS

## 2016-04-26 MED ORDER — RIFAXIMIN 550 MG PO TABS
550.0000 mg | ORAL_TABLET | Freq: Two times a day (BID) | ORAL | Status: DC
  Administered 2016-04-26 – 2016-04-28 (×5): 550 mg via ORAL
  Filled 2016-04-26 (×7): qty 1

## 2016-04-26 MED ORDER — SODIUM CHLORIDE 0.9 % IV SOLN: INTRAVENOUS | Status: DC

## 2016-04-26 MED ORDER — SODIUM CHLORIDE 0.9 % IV SOLN: 250.0000 mL | INTRAVENOUS | Status: DC | PRN

## 2016-04-26 MED ORDER — PANTOPRAZOLE SODIUM 40 MG PO TBEC
40.0000 mg | DELAYED_RELEASE_TABLET | Freq: Every day | ORAL | Status: DC
  Administered 2016-04-26 – 2016-04-28 (×3): 40 mg via ORAL
  Filled 2016-04-26 (×3): qty 1

## 2016-04-26 MED ORDER — SODIUM CHLORIDE 0.9% FLUSH
3.0000 mL | Freq: Two times a day (BID) | INTRAVENOUS | Status: DC
  Administered 2016-04-26 – 2016-04-27 (×3): 3 mL via INTRAVENOUS

## 2016-04-26 NOTE — ED Provider Notes (Signed)
[facility] DEPT Provider Note   CSN: [id number] Arrival date & time: 04/26/16  0027     History   Chief Complaint Chief Complaint  Patient presents with  . Alcohol Intoxication    HPI Jose [name] is a 60 y.o. male.  HPI   60 year old male presents today via EMS with acute alcohol intoxication. I was called to the room patient severely intoxicated smelling of alcohol. Patient refusing vital signs. Patient completely unsteady on his feet, he has no signs of trauma or findings that would be consistent with any other presentation other than acute intoxication.  Past Medical History:  Diagnosis Date  . Alcohol abuse   . Alcoholism ([place]) 07/12/2015  . Ascites   . Cirrhosis ([place])   . Depression   . Hepatitis C   . Hypertension   . Shortness of breath    with exertion    Patient Active Problem List   Diagnosis Date Noted  . 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 Hepatitis C, chronic ([place]) 01/04/2016  . Hernia, inguinal, right 09/12/2015  . Essential hypertension 09/12/2015  . Gastroesophageal reflux disease without esophagitis 09/12/2015  . Protein-calorie malnutrition, severe ([place]) 09/12/2015  . Anemia of chronic disease 09/12/2015  . Arthritis of left knee 09/12/2015  . Cirrhosis of liver with ascites ([place])   . Elevated LFTs   . AKI (acute kidney injury) ([place]) 09/03/2015  . Weakness 09/03/2015  . Generalized weakness   . Physical deconditioning   . Acute-on-chronic renal failure ([place])   . Alcoholic cirrhosis of liver with ascites ([place]) 07/25/2015  . Acute renal failure superimposed on stage 4 chronic kidney disease ([place]) 07/25/2015  . Hypokalemia 07/23/2015  . Macrocytic anemia 07/23/2015  . Alcoholism ([place]) 07/12/2015  . ARF (acute renal failure) ([place]) 06/27/2015  . Metabolic encephalopathy [id number]  . Hepatic encephalopathy ([place]) 06/26/2015  . Trichomonas infection 06/26/2015  . 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 Adenomatous polyps 10/18/2013  . Unspecified  vitamin D deficiency 07/13/2013  . Atopic dermatitis 04/28/2013  . Thrombocytopenia ([place]) 04/28/2013  . Anasarca 04/26/2013  . UTI (urinary tract infection) 04/26/2013    Past Surgical History:  Procedure Laterality Date  . CIRCUMCISION    . COLONOSCOPY  march 2015   Dr. Renee Harder: nodular mucosa at appendiceal orifice, tubular adenoma, extremely poor prep       Home Medications    Prior to Admission medications   Medication Sig Start Date End Date Taking? Authorizing Provider  KLOR-CON M20 20 MEQ tablet Take 20 mEq by mouth 2 (two) times daily. 07/20/15  Yes Historical Provider, MD  magnesium oxide (MAG-OX) 400 (241.3 Mg) MG tablet Take 1 tablet (400 mg total) by mouth daily. 09/05/15  Yes Reyne Dumas, MD  Multiple Vitamin (MULTIVITAMIN) tablet Take 1 tablet by mouth daily.   Yes Historical Provider, MD  omeprazole (PRILOSEC) 20 MG capsule Take 20 mg by mouth daily.   Yes Historical Provider, MD  folic acid (FOLVITE) 1 MG tablet Take 1 tablet (1 mg total) by mouth daily. 07/29/15   Barton Dubois, MD  furosemide (LASIX) 40 MG tablet Take 1 tablet (40 mg total) by mouth 2 (two) times daily. 09/05/15   Reyne Dumas, MD  lactulose (CHRONULAC) 10 GM/15ML solution Take 45 mLs (30 g total) by mouth 3 (three) times daily. Patient not taking: Reported on 04/26/2016 09/05/15   Reyne Dumas, MD  methocarbamol (ROBAXIN) 500 MG tablet Take 1 tablet (500 mg total) by mouth 2 (two) times daily. Patient not taking: Reported on  04/26/2016 12/18/15   Lacretia Leigh, MD  rifaximin (XIFAXAN) 550 MG TABS tablet Take 1 tablet (550 mg total) by mouth 2 (two) times daily. 09/26/15   Gerlene Fee, NP  spironolactone (ALDACTONE) 100 MG tablet Take 0.5 tablets (50 mg total) by mouth daily. 09/05/15   Reyne Dumas, MD  thiamine 100 MG tablet Take 1 tablet (100 mg total) by mouth daily. 09/05/15   Reyne Dumas, MD    Family History Family History  Problem Relation Age of Onset  . Breast cancer Mother   .  Hypertension Mother   . Diabetes Father   . Hypertension Sister   . Heart disease Sister   . Hypertension Paternal Grandmother   . Colon cancer Neg Hx     Social History Social History  Substance Use Topics  . Smoking status: Light Tobacco Smoker    Packs/day: 0.50    Types: Cigarettes  . Smokeless tobacco: Never Used     Comment: STATES HE SMOKES 2-3 TIMES MONTHLY  . Alcohol use Yes     Comment: A couple beers every few days      Allergies   Patient has no known allergies.   Review of Systems Review of Systems   Physical Exam Updated Vital Signs SpO2 97%   Physical Exam  Constitutional: He is oriented to person, place, and time.  HENT:  Head: Normocephalic and atraumatic.  Eyes: Conjunctivae are normal. Pupils are equal, round, and reactive to light. Right eye exhibits no discharge. Left eye exhibits no discharge. No scleral icterus.  Neck: Normal range of motion. No JVD present. No tracheal deviation present.  Pulmonary/Chest: Effort normal. No stridor.  Neurological: He is alert and oriented to person, place, and time. Coordination normal.  Intoxicated and unsteady gait  Psychiatric: He has a normal mood and affect. His behavior is normal. Judgment and thought content normal.  Nursing note and vitals reviewed.    ED Treatments / Results  Labs (all labs ordered are listed, but only abnormal results are displayed) Labs Reviewed - No data to display  EKG  EKG Interpretation None       Radiology No results found.  Procedures Procedures (including critical care time)  Medications Ordered in ED Medications - No data to display   Initial Impression / Assessment and Plan / ED Course  I have reviewed the triage vital signs and the nursing notes.  Pertinent labs & imaging results that were available during my care of the patient were reviewed by me and considered in my medical decision making (see chart for details).  Clinical Course     Patient  presents with acute alcohol intoxication. Patient is refusing vital signs and will not allow staff to perform necessary or activities. I see no signs of trauma or findings that would be consistent with any other etiology other than alcohol intoxication. I do not feel that it's necessary or safe for the staff to attempt vital signs or laboratory analysis of this patient at this time. Patient is too intoxicated to ambulate safely on his own, he will need to be monitored here in the ED until he is clinically sober and can provide more information. Staff to watch closely for any acute changes, patient will be reassessed by oncoming provider.  Final Clinical Impressions(s) / ED Diagnoses   Final diagnoses:  Alcoholic intoxication without complication [facility])    New Prescriptions New Prescriptions   No medications on file     Okey Regal, PA-C 04/26/16 [id number]  Leo Grosser, MD 04/26/16 [phone]

## 2016-04-26 NOTE — H&P (Signed)
[facility] History and Physical  Jose [name] [id number] DOB: 11/17/1955 DOA: 04/26/2016  PCP: Carmie Kanner, NP  Patient coming from: home  Chief Complaint: Altered mental status  HPI: Jose [name] is a 60 y.o. male with a medical history of alcoholism, hepatitis C, cirrhosis, hemochromatosis, that presented to the emergency department with altered mental status. Patient has had multiple admissions for encephalopathy. Supposedly he has not been drinking for several months per record. Alcohol level upon admission was negative. Patient currently very lethargic and unable to provide information. Patient was brought to the emergency department for presumed alcohol intoxication. He was noted wandering aimlessly by a train station.  ED Course: Patient found to have encephalopathy, ammonia level 119. CT head unremarkable. [facility] called for admission.  Review of Systems:  Unable to obtain due to altered mental status.  Past Medical History:  Diagnosis Date  . Alcohol abuse   . Alcoholism ([place]) 07/12/2015  . Ascites   . Cirrhosis ([place])   . Depression   . Hepatitis C   . Hypertension   . Shortness of breath    with exertion    Past Surgical History:  Procedure Laterality Date  . CIRCUMCISION    . COLONOSCOPY  march 2015   Dr. Renee Harder: nodular mucosa at appendiceal orifice, tubular adenoma, extremely poor prep    Social History:  reports that he has been smoking Cigarettes.  He has been smoking about 0.50 packs per day. He has never used smokeless tobacco. He reports that he drinks alcohol. He reports that he uses drugs, including Marijuana and Cocaine.   No Known Allergies  Family History  Problem Relation Age of Onset  . Breast cancer Mother   . Hypertension Mother   . Diabetes Father   . Hypertension Sister   . Heart disease Sister   . Hypertension Paternal Grandmother   . Colon cancer Neg Hx      Prior to Admission medications   Medication Sig Start Date End  Date Taking? Authorizing Provider  folic acid (FOLVITE) 1 MG tablet Take 1 tablet (1 mg total) by mouth daily. 07/29/15  Yes Barton Dubois, MD  furosemide (LASIX) 40 MG tablet Take 1 tablet (40 mg total) by mouth 2 (two) times daily. 09/05/15  Yes Reyne Dumas, MD  KLOR-CON M20 20 MEQ tablet Take 20 mEq by mouth 2 (two) times daily. 07/20/15  Yes Historical Provider, MD  magnesium oxide (MAG-OX) 400 (241.3 Mg) MG tablet Take 1 tablet (400 mg total) by mouth daily. 09/05/15  Yes Reyne Dumas, MD  Multiple Vitamin (MULTIVITAMIN) tablet Take 1 tablet by mouth daily.   Yes Historical Provider, MD  omeprazole (PRILOSEC) 20 MG capsule Take 20 mg by mouth daily.   Yes Historical Provider, MD  rifaximin (XIFAXAN) 550 MG TABS tablet Take 1 tablet (550 mg total) by mouth 2 (two) times daily. 09/26/15  Yes Gerlene Fee, NP  spironolactone (ALDACTONE) 100 MG tablet Take 0.5 tablets (50 mg total) by mouth daily. 09/05/15  Yes Reyne Dumas, MD  triamterene-hydrochlorothiazide (MAXZIDE-25) 37.5-25 MG tablet Take 1 tablet by mouth daily. 04/17/16  Yes Historical Provider, MD  lactulose (CHRONULAC) 10 GM/15ML solution Take 45 mLs (30 g total) by mouth 3 (three) times daily. Patient not taking: Reported on 04/26/2016 09/05/15   Reyne Dumas, MD  methocarbamol (ROBAXIN) 500 MG tablet Take 1 tablet (500 mg total) by mouth 2 (two) times daily. Patient not taking: Reported on 04/26/2016 12/18/15   Lacretia Leigh, MD  thiamine  100 MG tablet Take 1 tablet (100 mg total) by mouth daily. Patient not taking: Reported on 04/26/2016 09/05/15   Reyne Dumas, MD    Physical Exam: Vitals:   04/26/16 0319 04/26/16 0656  BP: 153/78 137/78  Pulse:  (!) 47  Resp: 18 18     General: Well developed, Chronically ill-appearing, NAD, appears stated age  HEENT: NCAT, PERRLA, EOMI, Anicteic Sclera, mucous membranes Dry  Neck: Supple, no JVD, no masses  Cardiovascular: S1 S2 auscultated, no rubs, murmurs or gallops.  Bradycardic  Respiratory: Clear to auscultation bilaterally with equal chest rise  Abdomen: Soft, nontender, nondistended, + bowel sounds  Extremities: warm dry without cyanosis clubbing or edema  Neuro: Unable to assess strength current encephalopathy. Cranial nerves are intact with limited testing, patient able to move all extremities spontaneously. Patient mildly lethargic. Only alert to self.  Skin: Without rashes exudates or nodules, dry  Psych: Unable to assess due to current encephalopathy  Labs on Admission: I have personally reviewed following labs and imaging studies CBC:  Recent Labs Lab 04/26/16 0540  WBC 4.0  NEUTROABS 2.0  HGB 13.7  HCT 38.5*  MCV 100.8*  PLT 73*   Basic Metabolic Panel:  Recent Labs Lab 04/26/16 0540  NA 136  K 3.3*  CL 108  CO2 19*  GLUCOSE 134*  BUN 20  CREATININE 1.32*  CALCIUM 8.4*   GFR: CrCl cannot be calculated (Unknown ideal weight.). Liver Function Tests:  Recent Labs Lab 04/26/16 0540  AST 98*  ALT 61  ALKPHOS 187*  BILITOT 1.5*  PROT 7.8  ALBUMIN 2.6*   No results for input(s): LIPASE, AMYLASE in the last 168 hours.  Recent Labs Lab 04/26/16 0540  AMMONIA 119*   Coagulation Profile: No results for input(s): INR, PROTIME in the last 168 hours. Cardiac Enzymes: No results for input(s): CKTOTAL, CKMB, CKMBINDEX, TROPONINI in the last 168 hours. BNP (last 3 results) No results for input(s): PROBNP in the last 8760 hours. HbA1C: No results for input(s): HGBA1C in the last 72 hours. CBG: No results for input(s): GLUCAP in the last 168 hours. Lipid Profile: No results for input(s): CHOL, HDL, LDLCALC, TRIG, CHOLHDL, LDLDIRECT in the last 72 hours. Thyroid Function Tests: No results for input(s): TSH, T4TOTAL, FREET4, T3FREE, THYROIDAB in the last 72 hours. Anemia Panel: No results for input(s): VITAMINB12, FOLATE, FERRITIN, TIBC, IRON, RETICCTPCT in the last 72 hours. Urine analysis:    Component Value  Date/Time   COLORURINE ORANGE (A) 09/03/2015 2300   APPEARANCEUR CLOUDY (A) 09/03/2015 2300   LABSPEC 1.025 09/03/2015 2300   PHURINE 5.5 09/03/2015 2300   GLUCOSEU NEGATIVE 09/03/2015 2300   HGBUR MODERATE (A) 09/03/2015 2300   BILIRUBINUR SMALL (A) 09/03/2015 2300   KETONESUR 15 (A) 09/03/2015 2300   PROTEINUR NEGATIVE 09/03/2015 2300   UROBILINOGEN 4.0 (H) 04/26/2013 0500   NITRITE POSITIVE (A) 09/03/2015 2300   LEUKOCYTESUR SMALL (A) 09/03/2015 2300   Sepsis Labs: @LABRCNTIP (procalcitonin:4,lacticidven:4) )No results found for this or any previous visit (from the past 240 hour(s)).   Radiological Exams on Admission: Dg Chest 2 View  Result Date: 04/26/2016 CLINICAL DATA:  [place] EXAM: CHEST  2 VIEW COMPARISON:  Chest radiograph Sep 03, 2015 FINDINGS: Cardiomediastinal silhouette is normal. No pleural effusions or focal consolidations. Trachea projects midline and there is no pneumothorax. Soft tissue planes and included osseous structures are non-suspicious. IMPRESSION: Normal chest. Electronically Signed   By: Elon Alas M.D.   On: 04/26/2016 06:05  Ct Head Wo Contrast  Result Date: 04/26/2016 CLINICAL DATA:  Altered mental non status. EXAM: CT HEAD WITHOUT CONTRAST TECHNIQUE: Contiguous axial images were obtained from the base of the skull through the vertex without intravenous contrast. COMPARISON:  Head CT 09/04/2015 FINDINGS: Brain: No evidence of acute infarction, hemorrhage, hydrocephalus, extra-axial collection or mass lesion/mass effect. Chronic small vessel ischemia is stable from prior exam. Vascular: Atherosclerosis of skullbase vasculature without hyperdense vessel or abnormal calcification. Skull: Normal. Negative for fracture or focal lesion. Sinuses/Orbits: Mild mucosal thickening of right sphenoid sinus and scattered ethmoid air cells. Mastoid air cells are well-aerated. Other: None. IMPRESSION: No acute intracranial abnormality.  Stable chronic small vessel ischemia. Electronically Signed   By: Jeb Levering M.D.   On: 04/26/2016 06:50    EKG: None  Assessment/Plan Acute hepatic encephalopathy -Patient is supposed to be taking lactulose as Xifaxan -Unknown if patient is going taking his medications -CT head showed no acute abnormality -Ammonia level 119 -Will order abdominal ultrasound -Of note alcohol level less than 5 -Have asked for urine tox screen and UA -CXR unremarkable for infeciton -Will continue lactulose to ensure 2-3 loose bowel movements per day -Continue Xifaxan -Continue to monitor CMP and lactulose levels  Acute kidney injury -Possibly due to poor oral intake and diuretics -We'll give patient gentle IV fluid rehydration and repeat metabolic panel -Monitor intake and output, daily weights -Suspect diuretic should be restarted once creatinine improves  History of hepatitis C, alcohol abuse, liver cirrhosis/ mildly elevated LFTs -AST currently 98, alkaline phosphatase 187 -Continue to monitor CMP  Chronic thrombocytopenia -Secondary to hepatitis C and cirrhosis -Continue to monitor CBC  Hemochromatosis -Patient follows with Dr. Alvy Bimler and receives phlebotomy q4weeks  Hypokalemia -replace and continue to monitor   Bradycardia -Heart rate currently in the 40s, does appear to be chronic issue per chart review -Would not use propranolol for possible varices given his bradycardia -Avoid AV blocking agents, continue to monitor on telemetry -Will obtain EKG as patient has had prolonged QT in the past  Essential hypertension -Hold spironolactone, Aldactone, Maxzide  GERD -Continue PPI  DVT prophylaxis: SCDs  Code Status: Full  Family Communication: None at bedside.   Disposition Plan: Admitted for observation.  Home when stable.  Consults called: None   Admission status: Observation.    Time spent: 70 minutes  Lexys Milliner D.O. [facility] Pager  [phone]  If 7PM-7AM, please contact night-coverage [email or web address] Password [email or web address] 04/26/2016, 8:14 AM

## 2016-04-26 NOTE — ED Notes (Addendum)
Patient ambulated self to bathroom with confusion and guidance by staff.  Patient appears to be intoxicated and continues to be hostile when approached to provide care

## 2016-04-26 NOTE — ED Notes (Signed)
Attempted in and cath with no success.

## 2016-04-26 NOTE — ED Notes (Signed)
Pt will not stay in the bed, pt is getting up and staggering around the room, almost falling. Pt attempts to hit staff when helping pt back to bed. Pt refusing vitals and swinging his arm in attempt to hit.

## 2016-04-26 NOTE — ED Notes (Signed)
Bed: WA09 Expected date:  Expected time:  Means of arrival:  Comments: Hold for Triage 6

## 2016-04-26 NOTE — Progress Notes (Addendum)
  Per CCMD QTc is .64   MD made aware.  Will continue to monitor closely.

## 2016-04-26 NOTE — ED Notes (Signed)
Pt's too intoxicated to communicate.  Pt is striking at staff.  Security on standby.  Per OD pt is too intoxicated to go to jail.  Pt is currently on a chair in triage.

## 2016-04-26 NOTE — ED Provider Notes (Signed)
By signing my name below, I, Jose [name], attest that this documentation has been prepared under the direction and in the presence of Jose Soho Ritter Helsley PA-C  Electronically Signed: Ephriam [name], ED Scribe. 04/26/16. 3:38 AM.   HPI Comments: LEVEL 5 CAVEAT DUE TO AMS  Jose [name] is a 60 y.o. male with a history of thrombocytopenia, hepatic encephalopathy, cirrhosis of the liver and elevated LFTs who presents to the Emergency Department with presumed alcohol intoxication after initial evaluation. Pt brought in by EMS after being found wandering aimlessly by [place] train station tonight. He is alert and responds to a few questions but is not oriented to place or time. Patient is agitated, refusing to lay on the bed and attempting to punch nurses and security officers.  He has clearly urinated on himself.  Record review shows neg EtOH level during the last few visits.    Physical Exam  BP 153/78 (BP Location: Left Arm)   Resp 18   SpO2 98%   Physical Exam  Constitutional: He appears well-developed and well-nourished. He appears lethargic. No distress.  HENT:  Head: Normocephalic.  No evidence of head trauma on clinical exam  Eyes: Conjunctivae are normal.  Neck: Normal range of motion.  Cardiovascular: Normal rate and regular rhythm.   Pulses:      Radial pulses are 2+ on the right side.  Pulmonary/Chest: Effort normal. No respiratory distress.  Congested cough  Musculoskeletal: Normal range of motion.  Neurological: He appears lethargic. GCS eye subscore is 3. GCS verbal subscore is 4. GCS motor subscore is 5.  Slurred speech  Skin: Skin is warm and dry.    ED Course  Procedures   Clinical Course as of Apr 26 649  Sat Apr 26, 2016  0336 Patient ambulatory with some assistance and a slightly off-balance gait. He continues to be agitated and altered. He responds to his name and follows some direction.  [HM]  [id number] Patient resting at this time, but arousable to voice.  [HM]  [id number]  Patient is improving, becoming less aggressive but remains unsteady on his feet.  Hx of alcoholic encephalitis and continues to be confused.    [HM]  [id number] Pt continues to improve.  He has allowed blood draws, but remains confused.  Will CT his head. Labs pending.  [HM]  [phone] At shift change patient care transferred to Jose [name] who will follow labs and CT, reassess and determine disposition  [HM]    Clinical Course User Index [HM] Jose Butts, PA-C     MDM Patient presents with agitation and history of EtOH abuse. Sign out from initial provider of acute alcohol intoxication. Patient agitated and combative with staff. Initially unable to obtain vital signs.  Record review shows a history of alcoholic encephalopathy. Concern that this may be the case today.  Patient allowed to sleep and calm down. One more cooperative labs and imaging obtained as patient remains altered.  Elevated pneumonia, mild hypokalemia, elevated serum creatinine and alcohol less than 5.  Patient with congested cough. Chest x-ray without evidence of pneumonia. CT scan pending.     I personally performed the services described in this documentation, which was scribed in my presence. The recorded information has been reviewed and is accurate.      Jose Soho Adell Koval, PA-C 04/26/16 [id number]    Jose Balls, MD 04/26/16 [phone]

## 2016-04-26 NOTE — ED Notes (Signed)
Bed: WTR6 Expected date:  Expected time:  Means of arrival:  Comments: 

## 2016-04-26 NOTE — ED Triage Notes (Signed)
Pt bib [facility] d/t ETOH intoxication.  [place].  Pt is calm and cooperative at this time.

## 2016-04-27 ENCOUNTER — Encounter (HOSPITAL_COMMUNITY): Payer: Self-pay

## 2016-04-27 DIAGNOSIS — N179 Acute kidney failure, unspecified: Secondary | ICD-10-CM | POA: Diagnosis present

## 2016-04-27 DIAGNOSIS — F17210 Nicotine dependence, cigarettes, uncomplicated: Secondary | ICD-10-CM | POA: Diagnosis present

## 2016-04-27 DIAGNOSIS — K7460 Unspecified cirrhosis of liver: Secondary | ICD-10-CM | POA: Diagnosis present

## 2016-04-27 DIAGNOSIS — Z79899 Other long term (current) drug therapy: Secondary | ICD-10-CM | POA: Diagnosis not present

## 2016-04-27 DIAGNOSIS — B182 Chronic viral hepatitis C: Secondary | ICD-10-CM

## 2016-04-27 DIAGNOSIS — K7200 Acute and subacute hepatic failure without coma: Secondary | ICD-10-CM

## 2016-04-27 DIAGNOSIS — E876 Hypokalemia: Secondary | ICD-10-CM | POA: Diagnosis present

## 2016-04-27 DIAGNOSIS — R9431 Abnormal electrocardiogram [ECG] [EKG]: Secondary | ICD-10-CM | POA: Diagnosis not present

## 2016-04-27 DIAGNOSIS — Z8249 Family history of ischemic heart disease and other diseases of the circulatory system: Secondary | ICD-10-CM | POA: Diagnosis not present

## 2016-04-27 DIAGNOSIS — K7290 Hepatic failure, unspecified without coma: Secondary | ICD-10-CM | POA: Diagnosis present

## 2016-04-27 DIAGNOSIS — I10 Essential (primary) hypertension: Secondary | ICD-10-CM

## 2016-04-27 DIAGNOSIS — R4182 Altered mental status, unspecified: Secondary | ICD-10-CM | POA: Diagnosis present

## 2016-04-27 DIAGNOSIS — F1021 Alcohol dependence, in remission: Secondary | ICD-10-CM | POA: Diagnosis present

## 2016-04-27 DIAGNOSIS — D696 Thrombocytopenia, unspecified: Secondary | ICD-10-CM | POA: Diagnosis present

## 2016-04-27 DIAGNOSIS — R001 Bradycardia, unspecified: Secondary | ICD-10-CM | POA: Diagnosis present

## 2016-04-27 DIAGNOSIS — K219 Gastro-esophageal reflux disease without esophagitis: Secondary | ICD-10-CM | POA: Diagnosis present

## 2016-04-27 LAB — COMPREHENSIVE METABOLIC PANEL
ALBUMIN: 2.2 g/dL — AB (ref 3.5–5.0)
ALK PHOS: 168 U/L — AB (ref 38–126)
ALT: 54 U/L (ref 17–63)
ANION GAP: 5 (ref 5–15)
AST: 86 U/L — AB (ref 15–41)
BILIRUBIN TOTAL: 1.5 mg/dL — AB (ref 0.3–1.2)
BUN: 18 mg/dL (ref 6–20)
CALCIUM: 7.8 mg/dL — AB (ref 8.9–10.3)
CO2: 21 mmol/L — ABNORMAL LOW (ref 22–32)
Chloride: 109 mmol/L (ref 101–111)
Creatinine, Ser: 1.18 mg/dL (ref 0.61–1.24)
GFR calc Af Amer: >60 mL/min (ref >60)
GLUCOSE: 133 mg/dL — AB (ref 65–99)
POTASSIUM: 3.5 mmol/L (ref 3.5–5.1)
Sodium: 135 mmol/L (ref 135–145)
TOTAL PROTEIN: 6.4 g/dL — AB (ref 6.5–8.1)

## 2016-04-27 LAB — CBC
HCT: 33.1 % — ABNORMAL LOW (ref 39.0–52.0)
Hemoglobin: 12 g/dL — ABNORMAL LOW (ref 13.0–17.0)
MCH: 36.6 pg — ABNORMAL HIGH (ref 26.0–34.0)
MCHC: 36.3 g/dL — AB (ref 30.0–36.0)
MCV: 100.9 fL — ABNORMAL HIGH (ref 78.0–100.0)
PLATELETS: 67 10*3/uL — AB (ref 150–400)
RBC: 3.28 MIL/uL — ABNORMAL LOW (ref 4.22–5.81)
RDW: 13.4 % (ref 11.5–15.5)
WBC: 4.2 10*3/uL (ref 4.0–10.5)

## 2016-04-27 LAB — AMMONIA: AMMONIA: 107 umol/L — AB (ref 9–35)

## 2016-04-27 MED ORDER — POTASSIUM CHLORIDE CRYS ER 20 MEQ PO TBCR
40.0000 meq | EXTENDED_RELEASE_TABLET | Freq: Once | ORAL | Status: AC
  Administered 2016-04-27: 40 meq via ORAL
  Filled 2016-04-27: qty 2

## 2016-04-27 NOTE — Progress Notes (Signed)
PROGRESS NOTE  Jose [name] [id number] DOB: July 11, 1955 DOA: 04/26/2016 PCP: Carmie Kanner, NP  HPI/Recap of past 3 hours: 60 year old male with past medical history of chronic hepatitis C and cirrhosis admitted on 12/30 confusion felt to be secondary to hepatic encephalopathy. Patient restarted back on his medications. Patient slightly hypokalemic on admission  This morning, patient is more awake and alert although still somewhat drowsy. He is also slightly unsteady. Morning EKG notes prolonged QTC.  He himself has no complaints.  Assessment/Plan: Active Problems:    Hepatic encephalopathy (HCC) secondary to history of chronic hepatitis C and cirrhosis: Also has secondary thrombocytopenia is stable. Continue to hold diuretics. Gently hydrate while trying to avoid volume overload. Patient's mentation looks to be improving. We'll have physical therapy assess. Continue lactulose and Xifaxan.    Hypokalemia: Replaced   AKI (acute kidney injury) [facility]): Improved today    Essential hypertension: Holding antihypertensives   Hemochromatosis Prolonged QTC: Repeat EKG in the morning. May have been secondary to hypokalemia Dr. Sunday Corn abnormalities.   Code Status: Full code   Family Communication: Family at the bedside   Disposition Plan: Anticipate discharge tomorrow after evaluation by PT and QTC improves    Consultants:  None   Procedures:  None   Antimicrobials:  None   DVT prophylaxis:  SCDs   Objective: Vitals:   04/26/16 1226 04/26/16 1900 04/27/16 0447 04/27/16 1514  BP: 131/77 (!) 153/73 126/72 (!) 143/73  Pulse: (!) 51 (!) 51 62 (!) 59  Resp: 18 18 18 16   Temp: 97.4 F (36.3 C) 97.6 F (36.4 C) 98.3 F (36.8 C) 98.2 F (36.8 C)  TempSrc: Oral Oral Oral Oral  SpO2: 100% 100% 100% 100%  Weight: 77.2 kg (170 lb 1.6 oz)  81.1 kg (178 lb 14.4 oz)   Height: 5\' 6"  (1.676 m)       Intake/Output Summary (Last 24 hours) at 04/27/16 1631 Last data filed at  04/27/16 1514  Gross per 24 hour  Intake             1875 ml  Output                0 ml  Net             1875 ml   Filed Weights   04/26/16 1226 04/27/16 0447  Weight: 77.2 kg (170 lb 1.6 oz) 81.1 kg (178 lb 14.4 oz)    Exam:   General:  Alert and oriented 2, no acute distress, somewhat drowsy   Cardiovascular: Regular rate and rhythm, S1-S2   Respiratory: Clear to auscultation bilaterally   Abdomen: Soft, mild distention, nontender, hypoactive bowel sounds   Musculoskeletal: No clubbing or cyanosis, trace pitting edema   Skin: Dry skin, otherwise no skin breaks, tears or lesions  Psychiatry: Patient is appropriate, no evidence of psychoses    Data Reviewed: CBC:  Recent Labs Lab 04/26/16 0540 04/27/16 0525  WBC 4.0 4.2  NEUTROABS 2.0  --   HGB 13.7 12.0*  HCT 38.5* 33.1*  MCV 100.8* 100.9*  PLT 73* 67*   Basic Metabolic Panel:  Recent Labs Lab 04/26/16 0540 04/26/16 0557 04/27/16 0525  NA 136  --  135  K 3.3*  --  3.5  CL 108  --  109  CO2 19*  --  21*  GLUCOSE 134*  --  133*  BUN 20  --  18  CREATININE 1.32*  --  1.18  CALCIUM 8.4*  --  7.8*  MG  --  2.2  --    GFR: Estimated Creatinine Clearance: 66.6 mL/min (by C-G formula based on SCr of 1.18 mg/dL). Liver Function Tests:  Recent Labs Lab 04/26/16 0540 04/27/16 0525  AST 98* 86*  ALT 61 54  ALKPHOS 187* 168*  BILITOT 1.5* 1.5*  PROT 7.8 6.4*  ALBUMIN 2.6* 2.2*   No results for input(s): LIPASE, AMYLASE in the last 168 hours.  Recent Labs Lab 04/26/16 0540 04/27/16 0525  AMMONIA 119* 107*   Coagulation Profile: No results for input(s): INR, PROTIME in the last 168 hours. Cardiac Enzymes: No results for input(s): CKTOTAL, CKMB, CKMBINDEX, TROPONINI in the last 168 hours. BNP (last 3 results) No results for input(s): PROBNP in the last 8760 hours. HbA1C: No results for input(s): HGBA1C in the last 72 hours. CBG: No results for input(s): GLUCAP in the last 168  hours. Lipid Profile: No results for input(s): CHOL, HDL, LDLCALC, TRIG, CHOLHDL, LDLDIRECT in the last 72 hours. Thyroid Function Tests: No results for input(s): TSH, T4TOTAL, FREET4, T3FREE, THYROIDAB in the last 72 hours. Anemia Panel: No results for input(s): VITAMINB12, FOLATE, FERRITIN, TIBC, IRON, RETICCTPCT in the last 72 hours. Urine analysis:    Component Value Date/Time   COLORURINE YELLOW 04/26/2016 [place] 04/26/2016 0959   LABSPEC 1.011 04/26/2016 0959   PHURINE 6.0 04/26/2016 0959   GLUCOSEU NEGATIVE 04/26/2016 0959   HGBUR MODERATE (A) 04/26/2016 0959   BILIRUBINUR NEGATIVE 04/26/2016 0959   KETONESUR NEGATIVE 04/26/2016 0959   PROTEINUR NEGATIVE 04/26/2016 0959   UROBILINOGEN 4.0 (H) 04/26/2013 0500   NITRITE NEGATIVE 04/26/2016 0959   LEUKOCYTESUR NEGATIVE 04/26/2016 0959   Sepsis Labs: @LABRCNTIP (procalcitonin:4,lacticidven:4)  )No results found for this or any previous visit (from the past 240 hour(s)).    Studies: No results found.  Scheduled Meds: . folic acid  1 mg Oral Daily  . lactulose  20 g Oral TID  . magnesium oxide  400 mg Oral Daily  . multivitamin with minerals  1 tablet Oral Daily  . pantoprazole  40 mg Oral Daily  . potassium chloride SA  20 mEq Oral BID  . rifaximin  550 mg Oral BID  . sodium chloride flush  3 mL Intravenous Q12H  . sodium chloride flush  3 mL Intravenous Q12H  . thiamine  100 mg Oral Daily    Continuous Infusions:   LOS: 0 days     Annita Brod, MD [facility] Pager [phone]  If 7PM-7AM, please contact night-coverage [email or web address] Password [email or web address] 04/27/2016, 4:31 PM

## 2016-04-28 DIAGNOSIS — N179 Acute kidney failure, unspecified: Secondary | ICD-10-CM

## 2016-04-28 DIAGNOSIS — D696 Thrombocytopenia, unspecified: Secondary | ICD-10-CM

## 2016-04-28 LAB — COMPREHENSIVE METABOLIC PANEL
ALK PHOS: 204 U/L — AB (ref 38–126)
ALT: 51 U/L (ref 17–63)
ANION GAP: 5 (ref 5–15)
AST: 82 U/L — ABNORMAL HIGH (ref 15–41)
Albumin: 2 g/dL — ABNORMAL LOW (ref 3.5–5.0)
BILIRUBIN TOTAL: 1.7 mg/dL — AB (ref 0.3–1.2)
BUN: 14 mg/dL (ref 6–20)
CALCIUM: 7.5 mg/dL — AB (ref 8.9–10.3)
CO2: 19 mmol/L — AB (ref 22–32)
CREATININE: 1.08 mg/dL (ref 0.61–1.24)
Chloride: 110 mmol/L (ref 101–111)
GFR calc non Af Amer: >60 mL/min (ref >60)
Glucose, Bld: 152 mg/dL — ABNORMAL HIGH (ref 65–99)
Potassium: 3.8 mmol/L (ref 3.5–5.1)
Sodium: 134 mmol/L — ABNORMAL LOW (ref 135–145)
TOTAL PROTEIN: 6.4 g/dL — AB (ref 6.5–8.1)

## 2016-04-28 MED ORDER — LACTULOSE 10 GM/15ML PO SOLN: 20.0000 g | Freq: Three times a day (TID) | ORAL | 0 refills | Status: DC

## 2016-04-28 MED ORDER — KLOR-CON M20 20 MEQ PO TBCR: 20.0000 meq | EXTENDED_RELEASE_TABLET | Freq: Two times a day (BID) | ORAL | 2 refills | Status: DC

## 2016-04-28 MED ORDER — FUROSEMIDE 40 MG PO TABS: 40.0000 mg | ORAL_TABLET | Freq: Two times a day (BID) | ORAL | 3 refills | Status: DC

## 2016-04-28 MED ORDER — SPIRONOLACTONE 100 MG PO TABS: 50.0000 mg | ORAL_TABLET | Freq: Every day | ORAL | 3 refills | Status: DC

## 2016-04-28 MED ORDER — OMEPRAZOLE 20 MG PO CPDR: 20.0000 mg | DELAYED_RELEASE_CAPSULE | Freq: Every day | ORAL | 2 refills | Status: DC

## 2016-04-28 NOTE — Discharge Summary (Signed)
Discharge Summary  Jose [name] [id number] DOB: [birth year]  PCP: Jose Kanner, NP  Admit date: 04/26/2016 Discharge date: 04/28/2016  Time spent: 25 minutes   Recommendations for Outpatient Follow-up:  1. Medication unclear, it looks like he was recently started on triamterene/HCTZ. Recommending that he stop this medicine for now 2. Patient will follow-up with his PCP in the next few weeks   Discharge Diagnoses:  Active Hospital Problems   Diagnosis Date Noted  . Acute hepatic encephalopathy 04/26/2016  . Hepatitis C, chronic ([place]) 01/04/2016  . 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 Essential hypertension 09/12/2015  . Elevated LFTs   . AKI (acute kidney injury) ([place]) 09/03/2015  . Hypokalemia 07/23/2015  . Hepatic encephalopathy ([place]) 06/26/2015  . Thrombocytopenia ([place]) 04/28/2013    Resolved Hospital Problems   Diagnosis Date Noted Date Resolved  No resolved problems to display.    Discharge Condition: Improved, being discharged home   Diet recommendation: Low-sodium   Vitals:   04/27/16 2100 04/28/16 0500  BP: 135/69 113/65  Pulse: 63 (!) 53  Resp: 18 16  Temp: 98.4 F (36.9 C) 98.3 F (36.8 C)    History of present illness:  61 year old male with past medical history of chronic hepatitis C and cirrhosis admitted on 12/30 confusion after he was found to be wandering. In the emergency room, I'll call level normal, but ammonia level elevated at 119. Patient's confusion felt to be secondary to hepatic encephalopathy. Patient restarted back on his medications. Patient slightly hypokalemic on admission  Hospital Course:  Active Problems:   Thrombocytopenia ([place])   Hepatic encephalopathy ([facility]) secondary to history of chronic hepatitis C and cirrhosis: Also with secondary thrombocytopenia which was stable. Initially diuretics held and patient was gently hydrated while trying to avoid volume overload. Mentation slowly improved. By 1/1, patient much more alert. Seen by  physical therapy who did not have any further recommendations. Patient will resume lactulose and Xifaxan on discharge.   Hypokalemia: Replaced   AKI (acute kidney injury) ([place]): On admission, creatinine 1.3 to. By day of discharge down to 1.08   Elevated LFTs   Essential hypertension: Slightly low blood pressure on admission. Diuretics held and patient junk dehydrated.   Hemochromatosis: Continues to receive periodic phlebotomy as outpatient  Prolonged QTC: Noted on rhythm strips. EKG on 1231 noted improvement which might of been secondary to hypokalemia/L at abnormalities.  Procedures:  None   Consultations:  None   Discharge Exam: BP 113/65 (BP Location: Left Arm)   Pulse (!) 53   Temp 98.3 F (36.8 C) (Oral)   Resp 16   Ht 5\' 6"  (1.676 m)   Wt 83 kg (182 lb 15.7 oz)   SpO2 100%   BMI 29.53 kg/m   General: Alert and oriented 3, no acute distress  Cardiovascular: Regular rate and rhythm, S1-S2  Respiratory: Clear to auscultation bilaterally   Discharge Instructions You were cared for by a hospitalist during your hospital stay. If you have any questions about your discharge medications or the care you received while you were in the hospital after you are discharged, you can call the unit and asked to speak with the hospitalist on call if the hospitalist that took care of you is not available. Once you are discharged, your primary care physician will handle any further medical issues. Please note that NO REFILLS for any discharge medications will be authorized once you are discharged, as it is imperative that you return to your primary care physician (or establish a relationship  with a primary care physician if you do not have one) for your aftercare needs so that they can reassess your need for medications and monitor your lab values.  Discharge Instructions    Diet - low sodium heart healthy    Complete by:  As directed    Increase activity slowly    Complete by:  As  directed      Allergies as of 04/28/2016   No Known Allergies     Medication List    STOP taking these medications   triamterene-hydrochlorothiazide 37.5-25 MG tablet Commonly known as:  MAXZIDE-25     TAKE these medications   folic acid 1 MG tablet Commonly known as:  FOLVITE Take 1 tablet (1 mg total) by mouth daily.   furosemide 40 MG tablet Commonly known as:  LASIX Take 1 tablet (40 mg total) by mouth 2 (two) times daily.   KLOR-CON M20 20 MEQ tablet Generic drug:  potassium chloride SA Take 20 mEq by mouth 2 (two) times daily.   lactulose 10 GM/15ML solution Commonly known as:  CHRONULAC Take 30 mLs (20 g total) by mouth 3 (three) times daily. What changed:  how much to take   magnesium oxide 400 (241.3 Mg) MG tablet Commonly known as:  MAG-OX Take 1 tablet (400 mg total) by mouth daily.   methocarbamol 500 MG tablet Commonly known as:  ROBAXIN Take 1 tablet (500 mg total) by mouth 2 (two) times daily.   multivitamin tablet Take 1 tablet by mouth daily.   omeprazole 20 MG capsule Commonly known as:  PRILOSEC Take 20 mg by mouth daily.   rifaximin 550 MG Tabs tablet Commonly known as:  XIFAXAN Take 1 tablet (550 mg total) by mouth 2 (two) times daily.   spironolactone 100 MG tablet Commonly known as:  ALDACTONE Take 0.5 tablets (50 mg total) by mouth daily.   thiamine 100 MG tablet Take 1 tablet (100 mg total) by mouth daily.      No Known Allergies Follow-up Information    PLACEY,Jose H, NP Follow up in 2 week(s).   Contact information: [address] [place] [address] [phone]            The results of significant diagnostics from this hospitalization (including imaging, microbiology, ancillary and laboratory) are listed below for reference.    Significant Diagnostic Studies: Dg Chest 2 View  Result Date: 04/26/2016 CLINICAL DATA:  [place] EXAM: CHEST  2 VIEW COMPARISON:  Chest radiograph  Sep 03, 2015 FINDINGS: Cardiomediastinal silhouette is normal. No pleural effusions or focal consolidations. Trachea projects midline and there is no pneumothorax. Soft tissue planes and included osseous structures are non-suspicious. IMPRESSION: Normal chest. Electronically Signed   By: Elon Alas M.D.   On: 04/26/2016 06:05   Ct Head Wo Contrast  Result Date: 04/26/2016 CLINICAL DATA:  Altered mental non status. EXAM: CT HEAD WITHOUT CONTRAST TECHNIQUE: Contiguous axial images were obtained from the base of the skull through the vertex without intravenous contrast. COMPARISON:  Head CT 09/04/2015 FINDINGS: Brain: No evidence of acute infarction, hemorrhage, hydrocephalus, extra-axial collection or mass lesion/mass effect. Chronic small vessel ischemia is stable from prior exam. Vascular: Atherosclerosis of skullbase vasculature without hyperdense vessel or abnormal calcification. Skull: Normal. Negative for fracture or focal lesion. Sinuses/Orbits: Mild mucosal thickening of right sphenoid sinus and scattered ethmoid air cells. Mastoid air cells are well-aerated. Other: None. IMPRESSION: No acute intracranial abnormality. Stable chronic small vessel ischemia. Electronically Signed  By: Jeb Levering M.D.   On: 04/26/2016 06:50   US Abdomen Complete  Result Date: 04/26/2016 CLINICAL DATA:  Hepatitis EXAM: ABDOMEN ULTRASOUND COMPLETE COMPARISON:  None. FINDINGS: Gallbladder: The gallbladder is contracted with gallstones. There is no sonographic Murphy sign. Common bile duct: Diameter: 3.5 mm Liver: There is mild heterogeneous echotexture of the liver. There is nodular contour of the liver. In the right lobe the liver, there is a 2.5 x 2.3 x 3.2 cm hypoechoic lesion. This is previously seen on prior MRI of Sep 05, 2015 and ultrasound of Sep 04, 2015 IVC: No abnormality visualized. Pancreas: Visualized portion unremarkable. Spleen: Size and appearance within normal limits. Right Kidney: Length: 12  cm. There is diffuse increased echotexture of the right kidney. No mass or hydronephrosis visualized. Left Kidney: Length: 12.4 cm. There is diffuse increased echotexture of the left kidney. No mass or hydronephrosis visualized. Abdominal aorta: No aneurysm visualized. Other findings: None. IMPRESSION: Findings suggesting cirrhosis of liver. Hypoechoic lesion in the right lobe liver previously evaluated with MRI of abdomen Sep 05, 2015. 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 Correlation with patient's biopsy results suggested. Cholelithiasis. Medical renal disease of bilateral kidneys. Electronically Signed   By: Abelardo Diesel M.D.   On: 04/26/2016 13:58    Microbiology: No results found for this or any previous visit (from the past 240 hour(s)).   Labs: Basic Metabolic Panel:  Recent Labs Lab 04/26/16 0540 04/26/16 0557 04/27/16 0525 04/28/16 0511  NA 136  --  135 134*  K 3.3*  --  3.5 3.8  CL 108  --  109 110  CO2 19*  --  21* 19*  GLUCOSE 134*  --  133* 152*  BUN 20  --  18 14  CREATININE 1.32*  --  1.18 1.08  CALCIUM 8.4*  --  7.8* 7.5*  MG  --  2.2  --   --    Liver Function Tests:  Recent Labs Lab 04/26/16 0540 04/27/16 0525 04/28/16 0511  AST 98* 86* 82*  ALT 61 54 51  ALKPHOS 187* 168* 204*  BILITOT 1.5* 1.5* 1.7*  PROT 7.8 6.4* 6.4*  ALBUMIN 2.6* 2.2* 2.0*   No results for input(s): LIPASE, AMYLASE in the last 168 hours.  Recent Labs Lab 04/26/16 0540 04/27/16 0525  AMMONIA 119* 107*   CBC:  Recent Labs Lab 04/26/16 0540 04/27/16 0525  WBC 4.0 4.2  NEUTROABS 2.0  --   HGB 13.7 12.0*  HCT 38.5* 33.1*  MCV 100.8* 100.9*  PLT 73* 67*   Cardiac Enzymes: No results for input(s): CKTOTAL, CKMB, CKMBINDEX, TROPONINI in the last 168 hours. BNP: BNP (last 3 results)  Recent Labs  07/24/15 0230  BNP 39.8    ProBNP (last 3 results) No results for input(s): PROBNP in the last 8760  hours.  CBG: No results for input(s): GLUCAP in the last 168 hours.     Signed:  Annita Brod, MD [facility] 04/28/2016, 12:35 PM

## 2016-04-28 NOTE — Evaluation (Signed)
Physical Therapy One Time Evaluation Patient Details Name: Jose [name] MRN: [id number] DOB: [birth year] Today's Date: 04/28/2016   History of Present Illness  61 year old male with past medical history of chronic hepatitis C and cirrhosis admitted on 12/30 confusion felt to be secondary to hepatic encephalopathy  Clinical Impression  Patient evaluated by Physical Therapy with no further acute PT needs identified. All education has been completed and the patient has no further questions.  Pt mobilizing well with no unsteadiness or LOB observed during gait. See below for any follow-up Physical Therapy or equipment needs. PT is signing off. Thank you for this referral.     Follow Up Recommendations No PT follow up    Equipment Recommendations  None recommended by PT    Recommendations for Other Services       Precautions / Restrictions Precautions Precautions: Fall      Mobility  Bed Mobility Overal bed mobility: Modified Independent                Transfers Overall transfer level: Modified independent               General transfer comment: pt donned pants at EOB  Ambulation/Gait Ambulation/Gait assistance: Supervision;Modified independent (Device/Increase time) Ambulation Distance (Feet): 180 Feet Assistive device: None Gait Pattern/deviations: Step-through pattern     General Gait Details: slow but steady gait, no unsteadiness or LOB observed, pt with increased work of breathing however denies SOB, SpO2 98% room air  Stairs            Wheelchair Mobility    Modified Rankin (Stroke Patients Only)       Balance Overall balance assessment:  (denies fall hx - from what he can remember - knows he came in confused)                                           Pertinent Vitals/Pain Pain Assessment: No/denies pain    Home Living   Living Arrangements: Alone               Additional Comments: pt reports he lives alone, no  stairs, no equipment    Prior Function Level of Independence: Independent               Hand Dominance        Extremity/Trunk Assessment        Lower Extremity Assessment Lower Extremity Assessment: Overall WFL for tasks assessed       Communication   Communication: No difficulties  Cognition Arousal/Alertness: Awake/alert Behavior During Therapy: WFL for tasks assessed/performed Overall Cognitive Status: Within Functional Limits for tasks assessed                      General Comments      Exercises     Assessment/Plan    PT Assessment Patent does not need any further PT services  PT Problem List            PT Treatment Interventions      PT Goals (Current goals can be found in the Care Plan section)  Acute Rehab PT Goals PT Goal Formulation: All assessment and education complete, DC therapy    Frequency     Barriers to discharge        Co-evaluation  End of Session   Activity Tolerance: Patient tolerated treatment well Patient left: in bed;with call bell/phone within reach           Time: 1000-1008 PT Time Calculation (min) (ACUTE ONLY): 8 min   Charges:   PT Evaluation $PT Eval Low Complexity: 1 Procedure     PT G Codes:        Camiah Humm,KATHrine E 04/28/2016, 12:20 PM Carmelia Bake, PT, DPT 04/28/2016 Pager: [phone]

## 2016-04-29 MED FILL — spIRONOLACTONE 100 MG TAB: 100 | 60 days supply | Qty: 30 | Fill #0

## 2016-04-29 MED FILL — KLOR-CON M20 TABLET: 20 | 30 days supply | Qty: 60 | Fill #0

## 2016-04-29 MED FILL — GENERLAC 10 GM/15 ML SOLN: 10 | 3 days supply | Qty: 240 | Fill #0

## 2016-04-29 MED FILL — OMEPRAZOLE DR 20 MG CAPSULE: 20 | 30 days supply | Qty: 30 | Fill #0

## 2016-04-29 MED FILL — FUROSEMIDE 40 MG TABLET: 40 | 30 days supply | Qty: 60 | Fill #0

## 2016-05-02 ENCOUNTER — Telehealth: Payer: Self-pay | Admitting: Hematology and Oncology

## 2016-05-02 NOTE — Telephone Encounter (Signed)
Patient call to reschedule his appointment today for Monday.  He said he just got out of the hospital on Tuesday.  I did not see any lab appointments for the patient.  I told him that someone would call him back today to confirm.

## 2016-05-02 NOTE — Telephone Encounter (Signed)
This is my LOS from his recent appt:  1/5, 2/2, labs and phlebotomy.  3/2: Labs, I see him at 130 pm, 30 mins before same day phlebotomy  Since we are overbooked today, he can have his Rx rescheduled from 1/5 to 1/15. Please reschedule the rest

## 2016-05-02 NOTE — Telephone Encounter (Signed)
Left message re 1/15 appointments. Scheduled for January thru March mailed. Appointments scheduled per 12/8 los and 1/5 schedule message - dates adjusted accordingly to remain one month apart.

## 2016-05-08 MED FILL — GENERLAC 10 GM/15 ML SOLN: 10 | 30 days supply | Qty: 450 | Fill #0

## 2016-05-10 ENCOUNTER — Emergency Department (HOSPITAL_COMMUNITY): Payer: Medicaid Other

## 2016-05-10 ENCOUNTER — Encounter (HOSPITAL_COMMUNITY): Payer: Self-pay | Admitting: Emergency Medicine

## 2016-05-10 ENCOUNTER — Inpatient Hospital Stay (HOSPITAL_COMMUNITY)
Admission: EM | Admit: 2016-05-10 | Discharge: 2016-05-11 | DRG: 434 | Disposition: A | Discharge status: Home / Self Care | Payer: Medicaid Other | Attending: Internal Medicine | Admitting: Internal Medicine

## 2016-05-10 DIAGNOSIS — R740 Nonspecific elevation of levels of transaminase and lactic acid dehydrogenase [LDH]: Secondary | ICD-10-CM | POA: Diagnosis present

## 2016-05-10 DIAGNOSIS — D696 Thrombocytopenia, unspecified: Secondary | ICD-10-CM | POA: Diagnosis present

## 2016-05-10 DIAGNOSIS — K7682 Hepatic encephalopathy: Secondary | ICD-10-CM | POA: Diagnosis present

## 2016-05-10 DIAGNOSIS — R001 Bradycardia, unspecified: Secondary | ICD-10-CM | POA: Diagnosis present

## 2016-05-10 DIAGNOSIS — F329 Major depressive disorder, single episode, unspecified: Secondary | ICD-10-CM | POA: Diagnosis present

## 2016-05-10 DIAGNOSIS — R7989 Other specified abnormal findings of blood chemistry: Secondary | ICD-10-CM | POA: Diagnosis not present

## 2016-05-10 DIAGNOSIS — K7040 Alcoholic hepatic failure without coma: Principal | ICD-10-CM | POA: Diagnosis present

## 2016-05-10 DIAGNOSIS — K7031 Alcoholic cirrhosis of liver with ascites: Secondary | ICD-10-CM | POA: Diagnosis present

## 2016-05-10 DIAGNOSIS — F17210 Nicotine dependence, cigarettes, uncomplicated: Secondary | ICD-10-CM | POA: Diagnosis present

## 2016-05-10 DIAGNOSIS — R4182 Altered mental status, unspecified: Secondary | ICD-10-CM | POA: Diagnosis present

## 2016-05-10 DIAGNOSIS — Z833 Family history of diabetes mellitus: Secondary | ICD-10-CM | POA: Diagnosis not present

## 2016-05-10 DIAGNOSIS — K219 Gastro-esophageal reflux disease without esophagitis: Secondary | ICD-10-CM | POA: Diagnosis present

## 2016-05-10 DIAGNOSIS — I10 Essential (primary) hypertension: Secondary | ICD-10-CM | POA: Diagnosis present

## 2016-05-10 DIAGNOSIS — F1020 Alcohol dependence, uncomplicated: Secondary | ICD-10-CM | POA: Diagnosis present

## 2016-05-10 DIAGNOSIS — F1410 Cocaine abuse, uncomplicated: Secondary | ICD-10-CM | POA: Diagnosis present

## 2016-05-10 DIAGNOSIS — K7290 Hepatic failure, unspecified without coma: Secondary | ICD-10-CM | POA: Diagnosis present

## 2016-05-10 DIAGNOSIS — Z8249 Family history of ischemic heart disease and other diseases of the circulatory system: Secondary | ICD-10-CM

## 2016-05-10 DIAGNOSIS — B1920 Unspecified viral hepatitis C without hepatic coma: Secondary | ICD-10-CM | POA: Diagnosis not present

## 2016-05-10 DIAGNOSIS — R55 Syncope and collapse: Secondary | ICD-10-CM

## 2016-05-10 DIAGNOSIS — Y900 Blood alcohol level of less than 20 mg/100 ml: Secondary | ICD-10-CM | POA: Diagnosis present

## 2016-05-10 DIAGNOSIS — E860 Dehydration: Secondary | ICD-10-CM | POA: Diagnosis present

## 2016-05-10 DIAGNOSIS — K7200 Acute and subacute hepatic failure without coma: Secondary | ICD-10-CM | POA: Diagnosis not present

## 2016-05-10 DIAGNOSIS — Z79899 Other long term (current) drug therapy: Secondary | ICD-10-CM | POA: Diagnosis not present

## 2016-05-10 LAB — RAPID URINE DRUG SCREEN, HOSP PERFORMED
AMPHETAMINES: NOT DETECTED
BARBITURATES: NOT DETECTED
Benzodiazepines: NOT DETECTED
Cocaine: POSITIVE — AB
Opiates: NOT DETECTED
TETRAHYDROCANNABINOL: NOT DETECTED

## 2016-05-10 LAB — URINALYSIS, ROUTINE W REFLEX MICROSCOPIC
BACTERIA UA: NONE SEEN
BILIRUBIN URINE: NEGATIVE
GLUCOSE, UA: NEGATIVE mg/dL
Ketones, ur: NEGATIVE mg/dL
LEUKOCYTES UA: NEGATIVE
NITRITE: NEGATIVE
Protein, ur: NEGATIVE mg/dL
SPECIFIC GRAVITY, URINE: 1.019 (ref 1.005–1.030)
Squamous Epithelial / LPF: NONE SEEN
pH: 6 (ref 5.0–8.0)

## 2016-05-10 LAB — CK: CK TOTAL: 445 U/L — AB (ref 49–397)

## 2016-05-10 LAB — ETHANOL: Alcohol, Ethyl (B): <5 mg/dL (ref <5)

## 2016-05-10 LAB — COMPREHENSIVE METABOLIC PANEL
ALT: 63 U/L (ref 17–63)
AST: 103 U/L — AB (ref 15–41)
Albumin: 2.2 g/dL — ABNORMAL LOW (ref 3.5–5.0)
Alkaline Phosphatase: 146 U/L — ABNORMAL HIGH (ref 38–126)
Anion gap: 10 (ref 5–15)
BUN: 13 mg/dL (ref 6–20)
CHLORIDE: 112 mmol/L — AB (ref 101–111)
CO2: 17 mmol/L — AB (ref 22–32)
CREATININE: 1.1 mg/dL (ref 0.61–1.24)
Calcium: 8.6 mg/dL — ABNORMAL LOW (ref 8.9–10.3)
GFR calc Af Amer: >60 mL/min (ref >60)
GFR calc non Af Amer: >60 mL/min (ref >60)
Glucose, Bld: 106 mg/dL — ABNORMAL HIGH (ref 65–99)
Potassium: 3.9 mmol/L (ref 3.5–5.1)
SODIUM: 139 mmol/L (ref 135–145)
Total Bilirubin: 3 mg/dL — ABNORMAL HIGH (ref 0.3–1.2)
Total Protein: 7 g/dL (ref 6.5–8.1)

## 2016-05-10 LAB — CBC WITH DIFFERENTIAL/PLATELET
BASOS ABS: 0 10*3/uL (ref 0.0–0.1)
Basophils Relative: 1 %
Eosinophils Absolute: 0.1 10*3/uL (ref 0.0–0.7)
Eosinophils Relative: 1 %
HEMATOCRIT: 36.5 % — AB (ref 39.0–52.0)
Hemoglobin: 12.8 g/dL — ABNORMAL LOW (ref 13.0–17.0)
LYMPHS PCT: 18 %
Lymphs Abs: 0.9 10*3/uL (ref 0.7–4.0)
MCH: 35.6 pg — ABNORMAL HIGH (ref 26.0–34.0)
MCHC: 35.1 g/dL (ref 30.0–36.0)
MCV: 101.4 fL — ABNORMAL HIGH (ref 78.0–100.0)
MONO ABS: 0.6 10*3/uL (ref 0.1–1.0)
MONOS PCT: 12 %
NEUTROS ABS: 3.5 10*3/uL (ref 1.7–7.7)
Neutrophils Relative %: 69 %
Platelets: 53 10*3/uL — ABNORMAL LOW (ref 150–400)
RBC: 3.6 MIL/uL — ABNORMAL LOW (ref 4.22–5.81)
RDW: 13.3 % (ref 11.5–15.5)
WBC: 5.1 10*3/uL (ref 4.0–10.5)

## 2016-05-10 LAB — LIPASE, BLOOD: Lipase: 36 U/L (ref 11–51)

## 2016-05-10 LAB — AMMONIA: Ammonia: 177 umol/L — ABNORMAL HIGH (ref 9–35)

## 2016-05-10 LAB — I-STAT TROPONIN, ED: Troponin i, poc: 0.01 ng/mL (ref 0.00–0.08)

## 2016-05-10 LAB — MAGNESIUM: MAGNESIUM: 1.6 mg/dL — AB (ref 1.7–2.4)

## 2016-05-10 LAB — I-STAT CG4 LACTIC ACID, ED: LACTIC ACID, VENOUS: 2.71 mmol/L — AB (ref 0.5–1.9)

## 2016-05-10 MED ORDER — ONDANSETRON HCL 4 MG/2ML IJ SOLN: 4.0000 mg | Freq: Four times a day (QID) | INTRAMUSCULAR | Status: DC | PRN

## 2016-05-10 MED ORDER — LORAZEPAM 2 MG/ML IJ SOLN: 1.0000 mg | Freq: Four times a day (QID) | INTRAMUSCULAR | Status: DC | PRN

## 2016-05-10 MED ORDER — RIFAXIMIN 550 MG PO TABS
550.0000 mg | ORAL_TABLET | Freq: Two times a day (BID) | ORAL | Status: DC
  Administered 2016-05-11 (×2): 550 mg via ORAL
  Filled 2016-05-10 (×3): qty 1

## 2016-05-10 MED ORDER — SODIUM CHLORIDE 0.9 % IV SOLN
INTRAVENOUS | Status: DC
  Administered 2016-05-11: 02:00:00 via INTRAVENOUS

## 2016-05-10 MED ORDER — LACTULOSE 10 GM/15ML PO SOLN
20.0000 g | Freq: Three times a day (TID) | ORAL | Status: DC
  Administered 2016-05-10 – 2016-05-11 (×3): 20 g via ORAL
  Filled 2016-05-10 (×3): qty 30

## 2016-05-10 MED ORDER — ADULT MULTIVITAMIN W/MINERALS CH
1.0000 | ORAL_TABLET | Freq: Every day | ORAL | Status: DC
  Administered 2016-05-11: 1 via ORAL
  Filled 2016-05-10: qty 1

## 2016-05-10 MED ORDER — LORAZEPAM 2 MG/ML IJ SOLN: 0.0000 mg | Freq: Two times a day (BID) | INTRAMUSCULAR | Status: DC

## 2016-05-10 MED ORDER — LORAZEPAM 1 MG PO TABS: 1.0000 mg | ORAL_TABLET | Freq: Four times a day (QID) | ORAL | Status: DC | PRN

## 2016-05-10 MED ORDER — ONDANSETRON HCL 4 MG PO TABS: 4.0000 mg | ORAL_TABLET | Freq: Four times a day (QID) | ORAL | Status: DC | PRN

## 2016-05-10 MED ORDER — LORAZEPAM 2 MG/ML IJ SOLN: 0.0000 mg | Freq: Four times a day (QID) | INTRAMUSCULAR | Status: DC

## 2016-05-10 MED ORDER — THIAMINE HCL 100 MG/ML IJ SOLN: 100.0000 mg | Freq: Every day | INTRAMUSCULAR | Status: DC

## 2016-05-10 MED ORDER — THIAMINE HCL 100 MG/ML IJ SOLN
Freq: Once | INTRAVENOUS | Status: AC
  Administered 2016-05-10: 20:00:00 via INTRAVENOUS
  Filled 2016-05-10: qty 1000

## 2016-05-10 MED ORDER — VITAMIN B-1 100 MG PO TABS
100.0000 mg | ORAL_TABLET | Freq: Every day | ORAL | Status: DC
  Administered 2016-05-11: 100 mg via ORAL
  Filled 2016-05-10: qty 1

## 2016-05-10 MED ORDER — NICOTINE 21 MG/24HR TD PT24
21.0000 mg | MEDICATED_PATCH | Freq: Every day | TRANSDERMAL | Status: DC
  Administered 2016-05-11: 21 mg via TRANSDERMAL
  Filled 2016-05-10: qty 1

## 2016-05-10 MED ORDER — LACTULOSE 10 GM/15ML PO SOLN
20.0000 g | Freq: Once | ORAL | Status: DC
  Filled 2016-05-10: qty 30

## 2016-05-10 MED ORDER — PANTOPRAZOLE SODIUM 40 MG PO TBEC
40.0000 mg | DELAYED_RELEASE_TABLET | Freq: Every day | ORAL | Status: DC
  Administered 2016-05-11: 40 mg via ORAL
  Filled 2016-05-10: qty 1

## 2016-05-10 MED ORDER — MAGNESIUM OXIDE 400 (241.3 MG) MG PO TABS
400.0000 mg | ORAL_TABLET | Freq: Every day | ORAL | Status: DC
  Administered 2016-05-11: 400 mg via ORAL
  Filled 2016-05-10 (×2): qty 1

## 2016-05-10 MED ORDER — SODIUM CHLORIDE 0.9% FLUSH: 3.0000 mL | Freq: Two times a day (BID) | INTRAVENOUS | Status: DC

## 2016-05-10 MED ORDER — DEXTROSE 5 % IV SOLN
1.0000 g | INTRAVENOUS | Status: DC
  Administered 2016-05-11: 1 g via INTRAVENOUS
  Filled 2016-05-10 (×2): qty 10

## 2016-05-10 MED ORDER — ONE-DAILY MULTI VITAMINS PO TABS: 1.0000 | ORAL_TABLET | Freq: Every day | ORAL | Status: DC

## 2016-05-10 MED ORDER — FOLIC ACID 1 MG PO TABS
1.0000 mg | ORAL_TABLET | Freq: Every day | ORAL | Status: DC
  Administered 2016-05-11: 1 mg via ORAL
  Filled 2016-05-10: qty 1

## 2016-05-10 NOTE — ED Notes (Signed)
Pt came in with soiled clothing from urine and feces.  Bedbug found in clothing.  Patient cleaned up and clean linen/gown placed on patient.

## 2016-05-10 NOTE — ED Provider Notes (Signed)
Emergency Department Provider Note   I have reviewed the triage vital signs and the nursing notes.   HISTORY  Chief Complaint Loss of Consciousness   HPI Jose [name] is a 61 y.o. male with PMH of EtOH abuse, cirrhosis with Hep C, and HTN presents to the emergency department by EMS AMS. Patient was reportedly living at a halfway house and was found down on the floor. I staggers at the home say that he had been on the floor for the past 24 hours. No one knows exactly what happened leading up to being found on the floor. The patient is unable to provide significant history as to events preceding the fall. He denies pain.   Level 5 caveat: Patient with confusion and AMS. He is unable to provide a meaningful history.   Past Medical History:  Diagnosis Date  . Alcohol abuse   . Alcoholism ([place]) 07/12/2015  . Ascites   . Cirrhosis ([place])   . Depression   . Hepatitis C   . Hypertension   . Shortness of breath    with exertion    Patient Active Problem List   Diagnosis Date Noted  . Elevated lactic acid level 05/10/2016  . Acute hepatic encephalopathy 04/26/2016  . 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 Hepatitis C, chronic ([place]) 01/04/2016  . Hernia, inguinal, right 09/12/2015  . Essential hypertension 09/12/2015  . Gastroesophageal reflux disease without esophagitis 09/12/2015  . Protein-calorie malnutrition, severe ([place]) 09/12/2015  . Anemia of chronic disease 09/12/2015  . Arthritis of left knee 09/12/2015  . Cirrhosis of liver with ascites ([place])   . Elevated LFTs   . AKI (acute kidney injury) ([place]) 09/03/2015  . Weakness 09/03/2015  . Generalized weakness   . Physical deconditioning   . Acute-on-chronic renal failure ([place])   . Alcoholic cirrhosis of liver with ascites ([place]) 07/25/2015  . Acute renal failure superimposed on stage 4 chronic kidney disease ([place]) 07/25/2015  . Hypokalemia 07/23/2015  . Macrocytic anemia 07/23/2015  . Alcoholism ([place]) 07/12/2015  . ARF (acute  renal failure) ([place]) 06/27/2015  . Metabolic encephalopathy [id number]  . Hepatic encephalopathy ([place]) 06/26/2015  . Trichomonas infection 06/26/2015  . 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 Adenomatous polyps 10/18/2013  . Unspecified vitamin D deficiency 07/13/2013  . Atopic dermatitis 04/28/2013  . Thrombocytopenia ([place]) 04/28/2013  . Anasarca 04/26/2013  . UTI (urinary tract infection) 04/26/2013    Past Surgical History:  Procedure Laterality Date  . CIRCUMCISION    . COLONOSCOPY  march 2015   Dr. Renee Harder: nodular mucosa at appendiceal orifice, tubular adenoma, extremely poor prep      Allergies Patient has no known allergies.  Family History  Problem Relation Age of Onset  . Breast cancer Mother   . Hypertension Mother   . Diabetes Father   . Hypertension Sister   . Heart disease Sister   . Hypertension Paternal Grandmother   . Colon cancer Neg Hx     Social History Social History  Substance Use Topics  . Smoking status: Light Tobacco Smoker    Packs/day: 0.50    Types: Cigarettes  . Smokeless tobacco: Never Used     Comment: STATES HE SMOKES 2-3 TIMES MONTHLY  . Alcohol use Yes     Comment: A couple beers every few days     Review of Systems  Level 5 caveat: Patient with AMS and is unable to provide significant HPI or ROS.   ____________________________________________   PHYSICAL EXAM:  VITAL SIGNS:  ED Triage Vitals  Enc Vitals Group     BP 05/10/16 1902 139/75     Pulse Rate 05/10/16 1902 (!) 52     Resp 05/10/16 1902 14     Temp 05/10/16 1902 98 F (36.7 C)     Temp Source 05/10/16 1902 Oral     SpO2 05/10/16 1902 100 %     Weight 05/10/16 1902 180 lb (81.6 kg)     Height 05/10/16 1902 5\' 6"  (1.676 m)     Pain Score 05/10/16 1903 0   Constitutional: Drowsy but awakens to voice. Able to provide name and inconsistently answering yes/no questions. Patient with stool in pants and chronically ill-appearing and disheveled.  Eyes: Conjunctivae  are normal.  Head: Atraumatic. Nose: No congestion/rhinnorhea. Mouth/Throat: Mucous membranes are very dry.  Neck: No stridor.  Cardiovascular: Bradycardia. Good peripheral circulation. Grossly normal heart sounds.   Respiratory: Normal respiratory effort.  No retractions. Lungs CTAB. Gastrointestinal: Soft and nontender. No distention.  Musculoskeletal: No lower extremity tenderness nor edema. No gross deformities of extremities. Neurologic:  Normal speech and language. No gross focal neurologic deficits are appreciated.  Skin:  Skin is warm, dry and intact. No rash noted. Bed bugs identified on clothes.   ____________________________________________   LABS (all labs ordered are listed, but only abnormal results are displayed)  Labs Reviewed  COMPREHENSIVE METABOLIC PANEL - Abnormal; Notable for the following:       Result Value   Chloride 112 (*)    CO2 17 (*)    Glucose, Bld 106 (*)    Calcium 8.6 (*)    Albumin 2.2 (*)    AST 103 (*)    Alkaline Phosphatase 146 (*)    Total Bilirubin 3.0 (*)    All other components within normal limits  CBC WITH DIFFERENTIAL/PLATELET - Abnormal; Notable for the following:    RBC 3.60 (*)    Hemoglobin 12.8 (*)    HCT 36.5 (*)    MCV 101.4 (*)    MCH 35.6 (*)    Platelets 53 (*)    All other components within normal limits  URINALYSIS, ROUTINE W REFLEX MICROSCOPIC - Abnormal; Notable for the following:    Color, Urine AMBER (*)    Hgb urine dipstick SMALL (*)    All other components within normal limits  CK - Abnormal; Notable for the following:    Total CK 445 (*)    All other components within normal limits  RAPID URINE DRUG SCREEN, HOSP PERFORMED - Abnormal; Notable for the following:    Cocaine POSITIVE (*)    All other components within normal limits  MAGNESIUM - Abnormal; Notable for the following:    Magnesium 1.6 (*)    All other components within normal limits  AMMONIA - Abnormal; Notable for the following:    Ammonia  177 (*)    All other components within normal limits  LACTIC ACID, PLASMA - Abnormal; Notable for the following:    Lactic Acid, Venous 2.2 (*)    All other components within normal limits  PROTIME-INR - Abnormal; Notable for the following:    Prothrombin Time 16.3 (*)    All other components within normal limits  COMPREHENSIVE METABOLIC PANEL - Abnormal; Notable for the following:    Chloride 113 (*)    CO2 19 (*)    Glucose, Bld 115 (*)    Calcium 8.3 (*)    Albumin 2.0 (*)    AST 100 (*)  Alkaline Phosphatase 157 (*)    Total Bilirubin 2.8 (*)    All other components within normal limits  CBC - Abnormal; Notable for the following:    RBC 3.55 (*)    Hemoglobin 12.7 (*)    HCT 35.8 (*)    MCV 100.8 (*)    MCH 35.8 (*)    Platelets 57 (*)    All other components within normal limits  I-STAT CG4 LACTIC ACID, ED - Abnormal; Notable for the following:    Lactic Acid, Venous 2.71 (*)    All other components within normal limits  MRSA PCR SCREENING  CULTURE, BLOOD (ROUTINE X 2)  CULTURE, BLOOD (ROUTINE X 2)  ETHANOL  LIPASE, BLOOD  PROCALCITONIN  GLUCOSE, CAPILLARY  LACTIC ACID, PLASMA  I-STAT TROPOININ, ED   ____________________________________________  EKG   EKG Interpretation  Date/Time:  Saturday May 10 2016 19:01:15 EST Ventricular Rate:  53 PR Interval:    QRS Duration: 104 QT Interval:  586 QTC Calculation: 551 R Axis:   58 Text Interpretation:  Sinus rhythm Short PR interval Anteroseptal infarct, age indeterminate Prolonged QT interval No STEMI.  Confirmed by Alaine Loughney MD, Kasi Lasky [phone]) on 05/10/2016 7:06:46 PM       ____________________________________________  RADIOLOGY  Ct Head Wo Contrast  Result Date: 05/10/2016 CLINICAL DATA:  Acute onset of altered mental status. Initial encounter. EXAM: CT HEAD WITHOUT CONTRAST TECHNIQUE: Contiguous axial images were obtained from the base of the skull through the vertex without intravenous contrast.  COMPARISON:  CT of the head performed 04/26/2016 FINDINGS: Brain: No evidence of acute infarction, hemorrhage, hydrocephalus, extra-axial collection or mass lesion/mass effect. Prominence of the sulci reflects mild cortical volume loss. Scattered periventricular and subcortical white matter change likely reflects small vessel ischemic microangiopathy. Mild cerebellar atrophy is noted. A chronic lacunar infarct is noted at the left basal ganglia. The brainstem and fourth ventricle are within normal limits. The cerebral hemispheres demonstrate grossly normal gray-white differentiation. No mass effect or midline shift is seen. Vascular: No hyperdense vessel or unexpected calcification. Skull: There is no evidence of fracture; visualized osseous structures are unremarkable in appearance. There is chronic deformity of the medial walls of both orbits. Sinuses/Orbits: The orbits are within normal limits. The paranasal sinuses and mastoid air cells are well-aerated. Other: No significant soft tissue abnormalities are seen. IMPRESSION: 1. No acute intracranial pathology seen on CT. 2. Mild cortical volume loss and scattered small vessel ischemic microangiopathy. 3. Chronic lacunar infarct at the left basal ganglia. Electronically Signed   By: Garald Balding M.D.   On: 05/10/2016 21:56   Dg Chest Portable 1 View  Result Date: 05/10/2016 CLINICAL DATA:  Syncope.  Found down. EXAM: PORTABLE CHEST 1 VIEW COMPARISON:  04/26/2016 FINDINGS: 2009 hours. Low volume film. But The lungs are clear wiithout focal pneumonia, edema, pneumothorax or pleural effusion. Cardiopericardial silhouette is at upper limits of normal for size. The visualized bony structures of the thorax are intact. Telemetry leads overlie the chest. IMPRESSION: No active disease. Electronically Signed   By: Misty Stanley M.D.   On: 05/10/2016 20:27    ____________________________________________   PROCEDURES  Procedure(s) performed:    Procedures  None ____________________________________________   INITIAL IMPRESSION / ASSESSMENT AND PLAN / ED COURSE  Pertinent labs & imaging results that were available during my care of the patient were reviewed by me and considered in my medical decision making (see chart for details).  Patient resents to the emergency department by EMS after being  found down at his halfway home. Patient unable to provide significant details surrounding the incident. He is awake but drowsy. He had an admission earlier this month with presumed hepatic encephalopathy secondary to hepatitis C and liver cirrhosis. Patient cannot account for whether or not he is been drinking or using additional drugs. With the story of him being on the floor for approximately 24 hours of sent labs in addition to baseline including CK and ammonia. Also be underlying infectious process. Plan to obtain chest x-ray and cathed for urine along with urine drug screen. We will obtain a CT scan of the head with unknown reason for fall and unwitnessed event. EKG is significant for QT prolongation which was present earlier this month and bradycardia.   10:45 PM Discussed patient's case with hospitalist, Dr. Blaine Hamper.  Recommend admission to tele, inpatient bed.  I will place holding orders per their request. Patient and family (if present) updated with plan. Care transferred to hospitlaist service.  I reviewed all nursing notes, vitals, pertinent old records, EKGs, labs, imaging (as available).  ____________________________________________  FINAL CLINICAL IMPRESSION(S) / ED DIAGNOSES  Final diagnoses:  Syncope and collapse  Hepatic encephalopathy (H[facility])     MEDICATIONS GIVEN DURING THIS VISIT:  Medications  lactulose (CHRONULAC) 10 GM/15ML solution 20 g (20 g Oral Given 05/10/16 2339)  pantoprazole (PROTONIX) EC tablet 40 mg (not administered)  rifaximin (XIFAXAN) tablet 550 mg (550 mg Oral Given 05/11/16 0144)  magnesium oxide  (MAG-OX) tablet 400 mg (not administered)  nicotine (NICODERM CQ - dosed in mg/24 hours) patch 21 mg (not administered)  0.9 %  sodium chloride infusion ( Intravenous New Bag/Given 05/11/16 0145)  cefTRIAXone (ROCEPHIN) 1 g in dextrose 5 % 50 mL IVPB (0 g Intravenous Stopped 05/11/16 0246)  LORazepam (ATIVAN) tablet 1 mg (not administered)    Or  LORazepam (ATIVAN) injection 1 mg (not administered)  thiamine (VITAMIN B-1) tablet 100 mg (not administered)    Or  thiamine (B-1) injection 100 mg (not administered)  folic acid (FOLVITE) tablet 1 mg (not administered)  multivitamin with minerals tablet 1 tablet (not administered)  LORazepam (ATIVAN) injection 0-4 mg (0 mg Intravenous Not Given 05/11/16 0600)    Followed by  LORazepam (ATIVAN) injection 0-4 mg (not administered)  sodium chloride flush (NS) 0.9 % injection 3 mL (3 mLs Intravenous Not Given 05/11/16 0104)  sodium chloride 0.9 % 1,000 mL with thiamine 123XX123 mg, folic acid 1 mg, multivitamins adult 10 mL infusion ( Intravenous New Bag/Given 05/10/16 2001)     NEW OUTPATIENT MEDICATIONS STARTED DURING THIS VISIT:  None   Note:  This document was prepared using Dragon voice recognition software and may include unintentional dictation errors.  Nanda Quinton, MD Emergency Medicine  Margette Fast, MD 05/11/16 [phone]

## 2016-05-10 NOTE — ED Triage Notes (Signed)
Per EMS, patient was picked up at a shared/halfway house.  Was told patient had been laying on floor without any waking for at least 24 hours.  Possible LOC. No one seemed to know what happened.  Possible bed bugs.  CBG 118, 144/79, RR 16, HR 55, 94% RA.  Oriented to person and place.

## 2016-05-10 NOTE — ED Notes (Signed)
In and out cath..440ml

## 2016-05-10 NOTE — ED Notes (Signed)
On way to CT 

## 2016-05-10 NOTE — H&P (Signed)
History and Physical    Jose [name] [id number] DOB: Apr 14, 1956 DOA: 05/10/2016  Referring MD/NP/PA:   PCP: Carmie Kanner, NP   Patient coming from:  The patient is coming halfway house.  At baseline, pt is independent for most of ADL.  Chief Complaint: AMS  HPI: Jose [name] is a 61 y.o. male with medical history significant of EtOH abuse, cirrhosis with Hep C, HTN, ascites, GERD, polysubstance abuse (tobacco, alcohol, cocaine), hemachromatosis, who presents with altered mental status.  Patient has AMS, and is unable to provide accurate medical history, therefore, most of the history is obtained by discussing the case with ED physician, per EMS report, and with the nursing staff.   Per report, patient was living at a halfway house and was found down on the floor and confused. He had likely been on the floor for the past 24 hours. No one knows exactly what happened leading up to being found on the floor. The patient is unable to provide meaningful history. Not sure if pt has any pain anywhere. No active nausea, vomiting, diarrhea, cough, respiratory distress observed. He moves all extremities. No fever or chills. Bedbug was found in clothing in ED.  Patient is cleaned up and clean linen/gown was placed on patient.    ED Course: pt was found to have lactate of 2.71, WBC 5.1, ammonium 177, CK 445, alcohol less than 5, positive UDS for cocaine, negative urinalysis, lipase is 36, creatinine 1.10, temperature normal, bradycardia, oxygen saturation 100% on room air, chest x-ray negative for acute issues. CT head is negative for acute intracranial abnormalities. Patient is admitted to telemetry bed as inpatient.  Review of Systems: Could not be reviewed accurately due to altered mental status.  Allergy: No Known Allergies  Past Medical History:  Diagnosis Date  . Alcohol abuse   . Alcoholism ([place]) 07/12/2015  . Ascites   . Cirrhosis ([place])   . Depression   . Hepatitis C   . Hypertension     . Shortness of breath    with exertion    Past Surgical History:  Procedure Laterality Date  . CIRCUMCISION    . COLONOSCOPY  march 2015   Dr. Renee Harder: nodular mucosa at appendiceal orifice, tubular adenoma, extremely poor prep    Social History:  reports that he has been smoking Cigarettes.  He has been smoking about 0.50 packs per day. He has never used smokeless tobacco. He reports that he drinks alcohol. He reports that he uses drugs, including Marijuana and Cocaine.  Family History:  Family History  Problem Relation Age of Onset  . Breast cancer Mother   . Hypertension Mother   . Diabetes Father   . Hypertension Sister   . Heart disease Sister   . Hypertension Paternal Grandmother   . Colon cancer Neg Hx      Prior to Admission medications   Medication Sig Start Date End Date Taking? Authorizing Provider  folic acid (FOLVITE) 1 MG tablet Take 1 tablet (1 mg total) by mouth daily. 07/29/15   Barton Dubois, MD  furosemide (LASIX) 40 MG tablet Take 1 tablet (40 mg total) by mouth 2 (two) times daily. 04/28/16   Annita Brod, MD  KLOR-CON M20 20 MEQ tablet Take 1 tablet (20 mEq total) by mouth 2 (two) times daily. 04/28/16   Annita Brod, MD  lactulose (CHRONULAC) 10 GM/15ML solution Take 30 mLs (20 g total) by mouth 3 (three) times daily. 04/28/16   Annita Brod, MD  magnesium oxide (MAG-OX) 400 (241.3 Mg) MG tablet Take 1 tablet (400 mg total) by mouth daily. 09/05/15   Reyne Dumas, MD  methocarbamol (ROBAXIN) 500 MG tablet Take 1 tablet (500 mg total) by mouth 2 (two) times daily. Patient not taking: Reported on 04/26/2016 12/18/15   Lacretia Leigh, MD  Multiple Vitamin (MULTIVITAMIN) tablet Take 1 tablet by mouth daily.    Historical Provider, MD  omeprazole (PRILOSEC) 20 MG capsule Take 1 capsule (20 mg total) by mouth daily. 04/28/16   Annita Brod, MD  rifaximin (XIFAXAN) 550 MG TABS tablet Take 1 tablet (550 mg total) by mouth 2 (two) times daily. 09/26/15    Gerlene Fee, NP  spironolactone (ALDACTONE) 100 MG tablet Take 0.5 tablets (50 mg total) by mouth daily. 04/28/16   Annita Brod, MD  thiamine 100 MG tablet Take 1 tablet (100 mg total) by mouth daily. Patient not taking: Reported on 04/26/2016 09/05/15   Reyne Dumas, MD    Physical Exam: Vitals:   05/10/16 2000 05/10/16 2015 05/10/16 2030 05/10/16 2045  BP: 134/74 (!) 150/104 137/81 133/76  Pulse: (!) 53 (!) 56 60 69  Resp: 13 15 13 13   Temp:      TempSrc:      SpO2: 100% 100% 100% 100%  Weight:      Height:       General: Not in acute distress. In poor hygiene HEENT:       Eyes: PERRL, EOMI, no scleral icterus.       ENT: No discharge from the ears and nose, no pharynx injection, no tonsillar enlargement.        Neck: No JVD, no bruit, no mass felt. Heme: No neck lymph node enlargement. Cardiac: S1/S2, RRR, No murmurs, No gallops or rubs. Respiratory: No rales, wheezing, rhonchi or rubs. GI: Soft, nondistended, seems to have abdominal tenderness in the central abdomen on palpation. No organomegaly, BS present. GU: No hematuria Ext: No pitting leg edema bilaterally. 2+DP/PT pulse bilaterally. Musculoskeletal: No joint deformities, No joint redness or warmth, no limitation of ROM in spin. Skin: No rashes. Has skin tear in left lower leg without active bleeding. Neuro: confused, oriented to place, but not time. Knows his own name . Cranial nerves II-XII grossly intact, moves all extremities normally.  Psych: Patient is not psychotic, no suicidal or hemocidal ideation.  Labs on Admission: I have personally reviewed following labs and imaging studies  CBC:  Recent Labs Lab 05/10/16 2021  WBC 5.1  NEUTROABS 3.5  HGB 12.8*  HCT 36.5*  MCV 101.4*  PLT 53*   Basic Metabolic Panel:  Recent Labs Lab 05/10/16 2021  NA 139  K 3.9  CL 112*  CO2 17*  GLUCOSE 106*  BUN 13  CREATININE 1.10  CALCIUM 8.6*  MG 1.6*   GFR: Estimated Creatinine Clearance: 71.6  mL/min (by C-G formula based on SCr of 1.1 mg/dL). Liver Function Tests:  Recent Labs Lab 05/10/16 2021  AST 103*  ALT 63  ALKPHOS 146*  BILITOT 3.0*  PROT 7.0  ALBUMIN 2.2*    Recent Labs Lab 05/10/16 2021  LIPASE 36    Recent Labs Lab 05/10/16 2028  AMMONIA 177*   Coagulation Profile: No results for input(s): INR, PROTIME in the last 168 hours. Cardiac Enzymes:  Recent Labs Lab 05/10/16 2021  CKTOTAL 445*   BNP (last 3 results) No results for input(s): PROBNP in the last 8760 hours. HbA1C: No results for input(s): HGBA1C in the last  72 hours. CBG: No results for input(s): GLUCAP in the last 168 hours. Lipid Profile: No results for input(s): CHOL, HDL, LDLCALC, TRIG, CHOLHDL, LDLDIRECT in the last 72 hours. Thyroid Function Tests: No results for input(s): TSH, T4TOTAL, FREET4, T3FREE, THYROIDAB in the last 72 hours. Anemia Panel: No results for input(s): VITAMINB12, FOLATE, FERRITIN, TIBC, IRON, RETICCTPCT in the last 72 hours. Urine analysis:    Component Value Date/Time   COLORURINE AMBER (A) 05/10/2016 2148   APPEARANCEUR CLEAR 05/10/2016 2148   LABSPEC 1.019 05/10/2016 2148   PHURINE 6.0 05/10/2016 2148   GLUCOSEU NEGATIVE 05/10/2016 2148   HGBUR SMALL (A) 05/10/2016 2148   BILIRUBINUR NEGATIVE 05/10/2016 2148   KETONESUR NEGATIVE 05/10/2016 2148   PROTEINUR NEGATIVE 05/10/2016 2148   UROBILINOGEN 4.0 (H) 04/26/2013 0500   NITRITE NEGATIVE 05/10/2016 2148   LEUKOCYTESUR NEGATIVE 05/10/2016 2148   Sepsis Labs: @LABRCNTIP (procalcitonin:4,lacticidven:4) )No results found for this or any previous visit (from the past 240 hour(s)).   Radiological Exams on Admission: Ct Head Wo Contrast  Result Date: 05/10/2016 CLINICAL DATA:  Acute onset of altered mental status. Initial encounter. EXAM: CT HEAD WITHOUT CONTRAST TECHNIQUE: Contiguous axial images were obtained from the base of the skull through the vertex without intravenous contrast. COMPARISON:   CT of the head performed 04/26/2016 FINDINGS: Brain: No evidence of acute infarction, hemorrhage, hydrocephalus, extra-axial collection or mass lesion/mass effect. Prominence of the sulci reflects mild cortical volume loss. Scattered periventricular and subcortical white matter change likely reflects small vessel ischemic microangiopathy. Mild cerebellar atrophy is noted. A chronic lacunar infarct is noted at the left basal ganglia. The brainstem and fourth ventricle are within normal limits. The cerebral hemispheres demonstrate grossly normal gray-white differentiation. No mass effect or midline shift is seen. Vascular: No hyperdense vessel or unexpected calcification. Skull: There is no evidence of fracture; visualized osseous structures are unremarkable in appearance. There is chronic deformity of the medial walls of both orbits. Sinuses/Orbits: The orbits are within normal limits. The paranasal sinuses and mastoid air cells are well-aerated. Other: No significant soft tissue abnormalities are seen. IMPRESSION: 1. No acute intracranial pathology seen on CT. 2. Mild cortical volume loss and scattered small vessel ischemic microangiopathy. 3. Chronic lacunar infarct at the left basal ganglia. Electronically Signed   By: Garald Balding M.D.   On: 05/10/2016 21:56   Dg Chest Portable 1 View  Result Date: 05/10/2016 CLINICAL DATA:  Syncope.  Found down. EXAM: PORTABLE CHEST 1 VIEW COMPARISON:  04/26/2016 FINDINGS: 2009 hours. Low volume film. But The lungs are clear wiithout focal pneumonia, edema, pneumothorax or pleural effusion. Cardiopericardial silhouette is at upper limits of normal for size. The visualized bony structures of the thorax are intact. Telemetry leads overlie the chest. IMPRESSION: No active disease. Electronically Signed   By: Misty Stanley M.D.   On: 05/10/2016 20:27     EKG: Independently reviewed. Sinus rhythm, QTC 551, biphasic T-wave in V3-V6, T-wave inversion in V1-V2, anteroseptal  infarction pattern  Assessment/Plan Principal Problem:   Acute hepatic encephalopathy Active Problems:   Thrombocytopenia ([place])   Hepatic encephalopathy ([facility])   Alcoholism ([place])   Alcoholic cirrhosis of liver with ascites ([place])   Essential hypertension   Gastroesophageal reflux disease without esophagitis   Hemochromatosis   Elevated lactic acid level   Acute hepatic encephalopathy: Patient is supposed to be taking lactulose as Xifaxan. Unknown if patient is taking his medications. CT head showed no acute abnormality. Ammonia level 117 which is consistent with hepatic encephalopathy. He  seems to have some abdominal tenderness on physical examination.  -will admit to tele bed as inpt -lactulose 20 g tid -Frequent neuro check -Continue to monitor CMP -Start Rocephin IV (need to revaluation when mental status improve to see if pt has AP or sigs of SBP, and to decide if to continue Abx) -blood culture x 2 -Continue Xifaxan  Elevated lactic acid: Possibly due to dehydration.  -will get Procalcitonin and trend lactic acid levels  -IVF: 1L of NS bolus in ED, followed by 125 cc/h   History of hepatitis C, alcohol abuse, liver cirrhosis/ mildly elevated LFTs: AST 103, ALT 63, total bilirubin 3.0, ALP 146. -Continue to monitor CMP -hold lasix and spironolactone due to elevated lactic acid  Chronic thrombocytopenia:  Platelets 53. Secondary to hepatitis C and cirrhosis. No bleeding tendency. -Continue to monitor CBC  Hemochromatosis -Patient follows with Dr. Alvy Bimler and receives phlebotomy q4weeks  Essential hypertension -Hold spironolactone, lasix   DVT ppx: SCD Code Status: Full code Family Communication: None at bed side. Disposition Plan:  Anticipate discharge back to previous  halfway house environment Consults called:  none Admission status: Inpatient/tele    Date of Service 05/10/2016    Ivor Costa [facility] Pager [phone]  If 7PM-7AM, please contact  night-coverage [email or web address] Password [email or web address] 05/10/2016, 11:18 PM

## 2016-05-10 NOTE — ED Notes (Signed)
Patient transported to CT 

## 2016-05-11 DIAGNOSIS — K7200 Acute and subacute hepatic failure without coma: Secondary | ICD-10-CM

## 2016-05-11 DIAGNOSIS — I10 Essential (primary) hypertension: Secondary | ICD-10-CM

## 2016-05-11 DIAGNOSIS — D696 Thrombocytopenia, unspecified: Secondary | ICD-10-CM

## 2016-05-11 DIAGNOSIS — R7989 Other specified abnormal findings of blood chemistry: Secondary | ICD-10-CM

## 2016-05-11 DIAGNOSIS — K7031 Alcoholic cirrhosis of liver with ascites: Secondary | ICD-10-CM

## 2016-05-11 LAB — CBC
HEMATOCRIT: 35.8 % — AB (ref 39.0–52.0)
Hemoglobin: 12.7 g/dL — ABNORMAL LOW (ref 13.0–17.0)
MCH: 35.8 pg — ABNORMAL HIGH (ref 26.0–34.0)
MCHC: 35.5 g/dL (ref 30.0–36.0)
MCV: 100.8 fL — AB (ref 78.0–100.0)
Platelets: 57 10*3/uL — ABNORMAL LOW (ref 150–400)
RBC: 3.55 MIL/uL — AB (ref 4.22–5.81)
RDW: 13.3 % (ref 11.5–15.5)
WBC: 5.3 10*3/uL (ref 4.0–10.5)

## 2016-05-11 LAB — COMPREHENSIVE METABOLIC PANEL
ALT: 61 U/L (ref 17–63)
AST: 100 U/L — AB (ref 15–41)
Albumin: 2 g/dL — ABNORMAL LOW (ref 3.5–5.0)
Alkaline Phosphatase: 157 U/L — ABNORMAL HIGH (ref 38–126)
Anion gap: 8 (ref 5–15)
BUN: 13 mg/dL (ref 6–20)
CHLORIDE: 113 mmol/L — AB (ref 101–111)
CO2: 19 mmol/L — ABNORMAL LOW (ref 22–32)
Calcium: 8.3 mg/dL — ABNORMAL LOW (ref 8.9–10.3)
Creatinine, Ser: 1.09 mg/dL (ref 0.61–1.24)
GFR calc Af Amer: >60 mL/min (ref >60)
Glucose, Bld: 115 mg/dL — ABNORMAL HIGH (ref 65–99)
POTASSIUM: 3.6 mmol/L (ref 3.5–5.1)
SODIUM: 140 mmol/L (ref 135–145)
Total Bilirubin: 2.8 mg/dL — ABNORMAL HIGH (ref 0.3–1.2)
Total Protein: 7 g/dL (ref 6.5–8.1)

## 2016-05-11 LAB — MRSA PCR SCREENING: MRSA by PCR: NEGATIVE

## 2016-05-11 LAB — PROTIME-INR
INR: 1.3
Prothrombin Time: 16.3 seconds — ABNORMAL HIGH (ref 11.4–15.2)

## 2016-05-11 LAB — PROCALCITONIN: PROCALCITONIN: 0.2 ng/mL

## 2016-05-11 LAB — LACTIC ACID, PLASMA: LACTIC ACID, VENOUS: 2.2 mmol/L — AB (ref 0.5–1.9)

## 2016-05-11 LAB — GLUCOSE, CAPILLARY: Glucose-Capillary: 95 mg/dL (ref 65–99)

## 2016-05-11 MED ORDER — LACTULOSE 10 GM/15ML PO SOLN: 20.0000 g | Freq: Three times a day (TID) | ORAL | 0 refills | Status: DC

## 2016-05-11 MED ORDER — RIFAXIMIN 550 MG PO TABS: 550.0000 mg | ORAL_TABLET | Freq: Two times a day (BID) | ORAL | 99 refills | Status: DC

## 2016-05-11 MED ORDER — NICOTINE 21 MG/24HR TD PT24: 21.0000 mg | MEDICATED_PATCH | Freq: Every day | TRANSDERMAL | 0 refills | Status: DC

## 2016-05-11 NOTE — Progress Notes (Signed)
NURSING PROGRESS NOTE  Jose [name] [id number] Discharge Data: 05/11/2016 6:32 PM Attending Provider: No att. providers found [id number] H, NP   [name] to be D/C'd Home per MD order.    All IV's will be discontinued and monitored for bleeding.  All belongings will be returned to patient for patient to take home.  Last Documented Vital Signs:  Blood pressure (!) 147/80, pulse 66, temperature 97.7 F (36.5 C), temperature source Oral, resp. rate 17, height 5\' 6"  (1.676 m), weight 82.1 kg (181 lb), SpO2 95 %.  Jose Hy, MSN, RN, [employer]

## 2016-05-11 NOTE — Progress Notes (Addendum)
2:30 PM CSW spoke to the pt who asked for treatment options for alcohol and/or substance abuse treamtment upon discharge and pt was informed that this would not be possible on most occasions being the d/c date is on a Sunday.  CSW called [facility] and was directed to the voicemail of Jose [name] in admissions and CSW left a message for Jose [name] to call the pt's sister Jose [name] at [phone] to make appointment on Tuesday 05/13/2016 for the patient to be interviewed and admitted at a later date to [facility] on [facility].       Pt also asked CSW about housing options using the pt's allotted amount of SSI ($600 or more per month) nad pt was not agreeable to all options available (transition to independent living homes, [facility]) due to these taking more of the pt's allotted funds per month than pt was comfortable with. Pt stated "I  have a place to go for a week or two until I call that place [facility])".  CSW provided the pt with the name, address and contact number of [facility] on [address] in [place] and written instructions on who to call, when to call and what to request in regards to an appointment for intake and 30 day treatment.     CSW was called by R.N. Helene Kelp on pt's unit on [address] and was informed pt's doctor had asked the CSW to consult with pt over concern for discharge due to pt's sister's concern Jose [name] at [phone] that pt's halfway house was concerning to the pt's sister as a viable and safe discharge, per the doctor.  CSW will go to pt's room and consult will pt.  Jose [name]. Jose [name], LCSWA, LCAS  05/11/16 12:34 pm

## 2016-05-11 NOTE — Discharge Summary (Signed)
Physician Discharge Summary  Jose [name] [id number] DOB: [birth year] DOA: 05/10/2016  PCP: Carmie Kanner, NP  Admit date: 05/10/2016 Discharge date: 05/11/2016  Admitted From: Home Disposition: Home  Recommendations for Outpatient Follow-up:  1. Follow up with PCP in 1-2 weeks 2. Prescriptions written for both lactulose and rifaximin.  Home Health:None Equipment/Devices: None  Discharge Condition: Fair CODE STATUS: Full code Diet recommendation: Low sodium   Discharge Diagnoses:  Principal Problem:   Acute hepatic encephalopathy   Active Problems:   Thrombocytopenia ([place])   Hepatic encephalopathy (HCC)   Alcoholism (HCC)   Alcoholic cirrhosis of liver with ascites ([place])   Essential hypertension   Gastroesophageal reflux disease without esophagitis   Hemochromatosis   Elevated lactic acid level  Brief narrative/history of present illness 61 y.o. male with medical history significant of EtOH abuse, cirrhosis with Hep C, HTN, ascites, GERD, polysubstance abuse (tobacco, alcohol, cocaine), hemachromatosis, who presents with altered mental status.  Patient has AMS, and is unable to provide accurate medical history, therefore, most of the history is obtained by discussing the case with ED physician, per EMS report, and with the nursing staff.   Per report, patient was living at a halfway house and was found down on the floor and confused. He had likely been on the floor for the past 24 hours. No one knows exactly what happened leading up to being found on the floor. The patient is unable to provide meaningful history. Not sure if pt has any pain anywhere. No active nausea, vomiting, diarrhea, cough, respiratory distress observed. He moves all extremities. No fever or chills. Bedbug was found in clothing in ED. Patient is cleaned up and clean linen/gown was placed on patient.   ED Course: pt was found to have lactate of 2.71, WBC 5.1, ammonium 177, CK 445, alcohol less than  5, positive UDS for cocaine, negative urinalysis, lipase is 36, creatinine 1.10, temperature normal, bradycardia, oxygen saturation 100% on room air, chest x-ray negative for acute issues. CT head is negative for acute intracranial abnormalities.  Admitted to telemetry.  Hospital course Acute hepatic encephalopathy:  She informs that he could not refill his lactulose and rifaximin for past 3-4 days (informs that he call the pharmacy he was told they did not have any prescription). Head CT unremarkable. Rifaximin and lactulose was resumed and mental status has significantly improved this morning. Patient strongly counseled on medication adherence and avoiding alcohol, smoking as well as cocaine use.   Elevated lactic acid:  Secondary to dehydration. Given gentle hydration. No clinical signs of infection.  Decompensated alcoholic and hep C cirrhosis Mild ascites without abdominal tenderness. No clinical signs or symptoms of SBP. Will discontinue antibiotics.   Chronic thrombocytopenia:   Continue to hep C cirrhosis. At baseline.  Hemochromatosis -Patient follows with Dr. Alvy Bimler and receives phlebotomy q4weeks  Essential hypertension Resume Lasix and Aldactone.   Polysubstance abuse Counseled strongly on cessation. Prescribed nicotine patch.  ? Homeless situation Sister at bedside concerned that patient does not have a proper place to live. He lives at a halfway house. Social work consulted to assist with any needs prior to discharge.  Family communication: Sister at bedside   procedure: Head CT  Disposition: Home  Discharge Instructions   Allergies as of 05/11/2016   No Known Allergies     Medication List    STOP taking these medications   thiamine 100 MG tablet     TAKE these medications   folic acid 1 MG tablet Commonly  known as:  FOLVITE Take 1 tablet (1 mg total) by mouth daily.   furosemide 40 MG tablet Commonly known as:  LASIX Take 1 tablet (40  mg total) by mouth 2 (two) times daily.   KLOR-CON M20 20 MEQ tablet Generic drug:  potassium chloride SA Take 1 tablet (20 mEq total) by mouth 2 (two) times daily.   lactulose 10 GM/15ML solution Commonly known as:  CHRONULAC Take 30 mLs (20 g total) by mouth 3 (three) times daily.   magnesium oxide 400 (241.3 Mg) MG tablet Commonly known as:  MAG-OX Take 1 tablet (400 mg total) by mouth daily.   methocarbamol 500 MG tablet Commonly known as:  ROBAXIN Take 1 tablet (500 mg total) by mouth 2 (two) times daily.   multivitamin tablet Take 1 tablet by mouth daily.   nicotine 21 mg/24hr patch Commonly known as:  NICODERM CQ - dosed in mg/24 hours Place 1 patch (21 mg total) onto the skin daily.   omeprazole 20 MG capsule Commonly known as:  PRILOSEC Take 1 capsule (20 mg total) by mouth daily.   rifaximin 550 MG Tabs tablet Commonly known as:  XIFAXAN Take 1 tablet (550 mg total) by mouth 2 (two) times daily.   spironolactone 100 MG tablet Commonly known as:  ALDACTONE Take 0.5 tablets (50 mg total) by mouth daily.      Follow-up Information    PLACEY,MARY H, NP. Schedule an appointment as soon as possible for a visit in 1 week(s).   Contact information: [place] [address] [phone]          No Known Allergies   Procedures/Studies: Dg Chest 2 View  Result Date: 04/26/2016 CLINICAL DATA:  [place] EXAM: CHEST  2 VIEW COMPARISON:  Chest radiograph Sep 03, 2015 FINDINGS: Cardiomediastinal silhouette is normal. No pleural effusions or focal consolidations. Trachea projects midline and there is no pneumothorax. Soft tissue planes and included osseous structures are non-suspicious. IMPRESSION: Normal chest. Electronically Signed   By: Elon Alas M.D.   On: 04/26/2016 06:05   Ct Head Wo Contrast  Result Date: 05/10/2016 CLINICAL DATA:  Acute onset of altered mental status. Initial encounter. EXAM: CT HEAD  WITHOUT CONTRAST TECHNIQUE: Contiguous axial images were obtained from the base of the skull through the vertex without intravenous contrast. COMPARISON:  CT of the head performed 04/26/2016 FINDINGS: Brain: No evidence of acute infarction, hemorrhage, hydrocephalus, extra-axial collection or mass lesion/mass effect. Prominence of the sulci reflects mild cortical volume loss. Scattered periventricular and subcortical white matter change likely reflects small vessel ischemic microangiopathy. Mild cerebellar atrophy is noted. A chronic lacunar infarct is noted at the left basal ganglia. The brainstem and fourth ventricle are within normal limits. The cerebral hemispheres demonstrate grossly normal gray-white differentiation. No mass effect or midline shift is seen. Vascular: No hyperdense vessel or unexpected calcification. Skull: There is no evidence of fracture; visualized osseous structures are unremarkable in appearance. There is chronic deformity of the medial walls of both orbits. Sinuses/Orbits: The orbits are within normal limits. The paranasal sinuses and mastoid air cells are well-aerated. Other: No significant soft tissue abnormalities are seen. IMPRESSION: 1. No acute intracranial pathology seen on CT. 2. Mild cortical volume loss and scattered small vessel ischemic microangiopathy. 3. Chronic lacunar infarct at the left basal ganglia. Electronically Signed   By: Garald Balding M.D.   On: 05/10/2016 21:56   Ct Head Wo Contrast  Result Date: 04/26/2016 CLINICAL DATA:  Altered mental non status. EXAM: CT HEAD WITHOUT CONTRAST TECHNIQUE: Contiguous axial images were obtained from the base of the skull through the vertex without intravenous contrast. COMPARISON:  Head CT 09/04/2015 FINDINGS: Brain: No evidence of acute infarction, hemorrhage, hydrocephalus, extra-axial collection or mass lesion/mass effect. Chronic small vessel ischemia is stable from prior exam. Vascular: Atherosclerosis of skullbase  vasculature without hyperdense vessel or abnormal calcification. Skull: Normal. Negative for fracture or focal lesion. Sinuses/Orbits: Mild mucosal thickening of right sphenoid sinus and scattered ethmoid air cells. Mastoid air cells are well-aerated. Other: None. IMPRESSION: No acute intracranial abnormality. Stable chronic small vessel ischemia. Electronically Signed   By: Jeb Levering M.D.   On: 04/26/2016 06:50   US Abdomen Complete  Result Date: 04/26/2016 CLINICAL DATA:  Hepatitis EXAM: ABDOMEN ULTRASOUND COMPLETE COMPARISON:  None. FINDINGS: Gallbladder: The gallbladder is contracted with gallstones. There is no sonographic Murphy sign. Common bile duct: Diameter: 3.5 mm Liver: There is mild heterogeneous echotexture of the liver. There is nodular contour of the liver. In the right lobe the liver, there is a 2.5 x 2.3 x 3.2 cm hypoechoic lesion. This is previously seen on prior MRI of Sep 05, 2015 and ultrasound of Sep 04, 2015 IVC: No abnormality visualized. Pancreas: Visualized portion unremarkable. Spleen: Size and appearance within normal limits. Right Kidney: Length: 12 cm. There is diffuse increased echotexture of the right kidney. No mass or hydronephrosis visualized. Left Kidney: Length: 12.4 cm. There is diffuse increased echotexture of the left kidney. No mass or hydronephrosis visualized. Abdominal aorta: No aneurysm visualized. Other findings: None. IMPRESSION: Findings suggesting cirrhosis of liver. Hypoechoic lesion in the right lobe liver previously evaluated with MRI of abdomen Sep 05, 2015. 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 Correlation with patient's biopsy results suggested. Cholelithiasis. Medical renal disease of bilateral kidneys. Electronically Signed   By: Abelardo Diesel M.D.   On: 04/26/2016 13:58   Dg Chest Portable 1 View  Result Date: 05/10/2016 CLINICAL DATA:  Syncope.  Found down. EXAM: PORTABLE CHEST 1 VIEW  COMPARISON:  04/26/2016 FINDINGS: 2009 hours. Low volume film. But The lungs are clear wiithout focal pneumonia, edema, pneumothorax or pleural effusion. Cardiopericardial silhouette is at upper limits of normal for size. The visualized bony structures of the thorax are intact. Telemetry leads overlie the chest. IMPRESSION: No active disease. Electronically Signed   By: Misty Stanley M.D.   On: 05/10/2016 20:27       Subjective: More alert and oriented. Denies any nausea, vomiting or abdominal pain.  Discharge Exam: Vitals:   05/11/16 0345 05/11/16 0531  BP: 132/85 (!) 152/78  Pulse: (!) 56 75  Resp: 26 19  Temp:  97.6 F (36.4 C)   Vitals:   05/11/16 0330 05/11/16 0345 05/11/16 0531 05/11/16 0552  BP: 144/87 132/85 (!) 152/78   Pulse: (!) 55 (!) 56 75   Resp: 17 26 19    Temp:   97.6 F (36.4 C)   TempSrc:   Oral   SpO2: 99% 98% 95%   Weight:    82.1 kg (181 lb)  Height:    5\' 6"  (1.676 m)    General: Middle aged December male not in distress HEENT: No pallor, no icterus, moist mucosa, supple neck   chest: Clear to auscultation bilaterally  CVS: Normal S1 and S2, no murmurs  GI: Soft, nondistended, nontender, bowel sounds present Musculoskeletal: Warm, no edema, skin tear in the left  lower leg CNS: Alert and oriented, no tremors    The results of significant diagnostics from this hospitalization (including imaging, microbiology, ancillary and laboratory) are listed below for reference.     Microbiology: Recent Results (from the past 240 hour(s))  MRSA PCR Screening     Status: None   Collection Time: 05/11/16  6:27 AM  Result Value Ref Range Status   MRSA by PCR NEGATIVE NEGATIVE Final    Comment:        The GeneXpert MRSA Assay (FDA approved for NASAL specimens only), is one component of a comprehensive MRSA colonization surveillance program. It is not intended to diagnose MRSA infection nor to guide or monitor treatment for MRSA infections.       Labs: BNP (last 3 results)  Recent Labs  07/24/15 0230  BNP [id number]   Basic Metabolic Panel:  Recent Labs Lab 05/10/16 2021 05/11/16 0403  NA 139 140  K 3.9 3.6  CL 112* 113*  CO2 17* 19*  GLUCOSE 106* 115*  BUN 13 13  CREATININE 1.10 1.09  CALCIUM 8.6* 8.3*  MG 1.6*  --    Liver Function Tests:  Recent Labs Lab 05/10/16 2021 05/11/16 0403  AST 103* 100*  ALT 63 61  ALKPHOS 146* 157*  BILITOT 3.0* 2.8*  PROT 7.0 7.0  ALBUMIN 2.2* 2.0*    Recent Labs Lab 05/10/16 2021  LIPASE 36    Recent Labs Lab 05/10/16 2028  AMMONIA 177*   CBC:  Recent Labs Lab 05/10/16 2021 05/11/16 0403  WBC 5.1 5.3  NEUTROABS 3.5  --   HGB 12.8* 12.7*  HCT 36.5* 35.8*  MCV 101.4* 100.8*  PLT 53* 57*   Cardiac Enzymes:  Recent Labs Lab 05/10/16 2021  CKTOTAL 445*   BNP: Invalid input(s): POCBNP CBG:  Recent Labs Lab 05/11/16 0827  GLUCAP 95   D-Dimer No results for input(s): DDIMER in the last 72 hours. Hgb A1c No results for input(s): HGBA1C in the last 72 hours. Lipid Profile No results for input(s): CHOL, HDL, LDLCALC, TRIG, CHOLHDL, LDLDIRECT in the last 72 hours. Thyroid function studies No results for input(s): TSH, T4TOTAL, T3FREE, THYROIDAB in the last 72 hours.  Invalid input(s): FREET3 Anemia work up No results for input(s): VITAMINB12, FOLATE, FERRITIN, TIBC, IRON, RETICCTPCT in the last 72 hours. Urinalysis    Component Value Date/Time   COLORURINE AMBER (A) 05/10/2016 2148   APPEARANCEUR CLEAR 05/10/2016 2148   LABSPEC 1.019 05/10/2016 2148   PHURINE 6.0 05/10/2016 2148   GLUCOSEU NEGATIVE 05/10/2016 2148   HGBUR SMALL (A) 05/10/2016 2148   BILIRUBINUR NEGATIVE 05/10/2016 2148   KETONESUR NEGATIVE 05/10/2016 2148   PROTEINUR NEGATIVE 05/10/2016 2148   UROBILINOGEN 4.0 (H) 04/26/2013 0500   NITRITE NEGATIVE 05/10/2016 2148   LEUKOCYTESUR NEGATIVE 05/10/2016 2148   Sepsis Labs Invalid input(s): PROCALCITONIN,  WBC,   LACTICIDVEN Microbiology Recent Results (from the past 240 hour(s))  MRSA PCR Screening     Status: None   Collection Time: 05/11/16  6:27 AM  Result Value Ref Range Status   MRSA by PCR NEGATIVE NEGATIVE Final    Comment:        The GeneXpert MRSA Assay (FDA approved for NASAL specimens only), is one component of a comprehensive MRSA colonization surveillance program. It is not intended to diagnose MRSA infection nor to guide or monitor treatment for MRSA infections.      Time coordinating discharge: < 30 minutes  SIGNED:   Louellen Molder, MD  [facility] 05/11/2016, 9:56 AM Pager   If 7PM-7AM, please contact night-coverage [email or web address] Password [email or web address]

## 2016-05-11 NOTE — Consult Note (Signed)
[facility] Nurse wound consult note Reason for Consult:Chronic full thickness wound with recent healing, reopened at medial edge Wound type: Venous insufficiency, trauma Pressure Injury POA: No Measurement: Area of involvement measures 3cm x 4cm of recently healed tissue (reepithelialized, thin scar) with 1cm x 0.6cm x 0.2cm area of full thickness re injury.  Patient denies trauma. Red, moist with scant serous exudate. Wound bed:As described above. Drainage (amount, consistency, odor) As described above Periwound:Intact, dry with evidence of previous wound healing Dressing procedure/placement/frequency: I will provide support for moisture retentive wound healing using white petrolatum and a dry dressing twice daily while here.  Elevation will remain a key component of complete repair to this area. [facility] nursing team will not follow, but will remain available to this patient, the nursing and medical teams.  Please re-consult if needed. Thanks, Maudie Flakes, MSN, RN, [place], [facility]  Pager# [phone]

## 2016-05-11 NOTE — ED Notes (Signed)
Spoke with Joelene Millin from Pharmacy for medication verification

## 2016-05-11 NOTE — Discharge Instructions (Signed)
Hepatic Encephalopathy Introduction Hepatic encephalopathy is a loss of brain function from advanced liver disease. The effects of the condition depend on the type of liver damage and how severe it is. In some cases, hepatic encephalopathy can be reversed. What are the causes? The exact cause of hepatic encephalopathy is not known. What increases the risk? You have a higher risk of getting this condition if your liver is damaged. When the liver is damaged harmful substances called toxins can build up in the body. Certain toxins, such as ammonia, can harm your brain. Conditions that can cause liver damage include:  An infection.  Dehydration.  Intestinal bleeding.  Drinking too much alcohol.  Taking certain medicines, including tranquilizers, water pills (diuretics), antidepressants, or sleeping pills. What are the signs or symptoms? Signs and symptoms may develop suddenly. Or, they may develop slowly and get worse gradually. Symptoms can range from mild to severe. Mild Hepatic Encephalopathy  Mild confusion.  Personality and mood changes.  Anxiety and agitation.  Drowsiness.  Loss of mental abilities.  Musty or sweet-smelling breath. Worsening or Severe Hepatic Encephalopathy  Slowed movement.  Slurred speech.  Extreme personality changes.  Disorientation.  Abnormal shaking or flapping of the hands.  Coma. How is this diagnosed? To make a diagnosis, your health care provider will do a physical exam. To rule out other causes of your signs and symptoms, he or she may order tests. You may have:  Blood tests. These may be done to check your ammonia level, measure how long it takes your blood to clot, and check for infection.  Liver function tests. These may be done to check how well your liver is working.  MRI and CT scans. These may be done to check for a brain disorder.  Electroencephalogram (EEG). This may be done to measure the electrical activity in your  brain. How is this treated? The first step in treatment is identifying and treating possible triggers. The next step is involves taking medicine to lower the level of toxins in the body and to prevent ammonia from building up. You may need to take:  Antibiotics to reduce the ammonia-producing bacteria in your gut.  Lactulose to help flush ammonia from the gut. Follow these instructions at home: Eating and drinking  Follow a low-protein diet that includes plenty of fruits, vegetables, and whole grains, as directed by your health care provider. Ammonia is produced when you digest high-protein foods.  Work with a Microbiologist or with your health care provider to make sure you are getting the right balance of protein and minerals.  Drink enough fluids to keep your urine clear or pale yellow. Drinking plenty of water helps prevent constipation.  Do not drink alcohol or use illegal drugs. Medicines  Only take medicine as directed by your health care provider.  If you were prescribed an antibiotic medicine, finish it all even if you start to feel better.  Do not start any new medicines, including over-the-counter medicines, without first checking with your health care provider. Contact a health care provider if:  You have new symptoms.  Your symptoms change.  Your symptoms get worse.  You have a fever.  You are constipated.  You have persistent nausea, vomiting, or diarrhea. Get help right away if:  You become very confused or drowsy.  You vomit blood or material that looks like coffee grounds.  Your stool is bloody or black or looks like tar. This information is not intended to replace advice given to you by  your health care provider. Make sure you discuss any questions you have with your health care provider. Document Released: 06/24/2006 Document Revised: 09/20/2015 Document Reviewed: 11/30/2013  2017 [employer]vier

## 2016-05-11 NOTE — Progress Notes (Signed)
Received report from Mullen, RN in ED for transfer of pt to [phone].

## 2016-05-12 ENCOUNTER — Other Ambulatory Visit: Payer: Medicaid Other

## 2016-05-16 LAB — CULTURE, BLOOD (ROUTINE X 2)
CULTURE: NO GROWTH
Culture: NO GROWTH

## 2016-05-19 MED FILL — CIPROFLOXACIN HCL 500 MG TA: 500 | 3 days supply | Qty: 6 | Fill #0

## 2016-05-19 MED FILL — ONDANSETRON HCL 8 MG TABLET: 8 | 5 days supply | Qty: 15 | Fill #0

## 2016-05-20 ENCOUNTER — Emergency Department (HOSPITAL_BASED_OUTPATIENT_CLINIC_OR_DEPARTMENT_OTHER): Payer: Medicaid Other

## 2016-05-20 ENCOUNTER — Encounter (HOSPITAL_BASED_OUTPATIENT_CLINIC_OR_DEPARTMENT_OTHER): Payer: Self-pay | Admitting: Emergency Medicine

## 2016-05-20 ENCOUNTER — Emergency Department (HOSPITAL_BASED_OUTPATIENT_CLINIC_OR_DEPARTMENT_OTHER)
Admission: EM | Admit: 2016-05-20 | Discharge: 2016-05-20 | Disposition: A | Discharge status: Home / Self Care | Payer: Medicaid Other | Attending: Emergency Medicine | Admitting: Emergency Medicine

## 2016-05-20 DIAGNOSIS — Z79899 Other long term (current) drug therapy: Secondary | ICD-10-CM | POA: Diagnosis not present

## 2016-05-20 DIAGNOSIS — I10 Essential (primary) hypertension: Secondary | ICD-10-CM | POA: Insufficient documentation

## 2016-05-20 DIAGNOSIS — R079 Chest pain, unspecified: Secondary | ICD-10-CM | POA: Insufficient documentation

## 2016-05-20 DIAGNOSIS — R197 Diarrhea, unspecified: Secondary | ICD-10-CM | POA: Insufficient documentation

## 2016-05-20 DIAGNOSIS — F17210 Nicotine dependence, cigarettes, uncomplicated: Secondary | ICD-10-CM | POA: Insufficient documentation

## 2016-05-20 DIAGNOSIS — R1013 Epigastric pain: Secondary | ICD-10-CM

## 2016-05-20 DIAGNOSIS — N4400 Torsion of testis, unspecified: Secondary | ICD-10-CM

## 2016-05-20 DIAGNOSIS — K4091 Unilateral inguinal hernia, without obstruction or gangrene, recurrent: Secondary | ICD-10-CM | POA: Diagnosis not present

## 2016-05-20 LAB — CBC WITH DIFFERENTIAL/PLATELET
Basophils Absolute: 0 10*3/uL (ref 0.0–0.1)
Basophils Relative: 1 %
EOS ABS: 0.1 10*3/uL (ref 0.0–0.7)
EOS PCT: 2 %
HEMATOCRIT: 35.3 % — AB (ref 39.0–52.0)
Hemoglobin: 12.6 g/dL — ABNORMAL LOW (ref 13.0–17.0)
LYMPHS ABS: 0.8 10*3/uL (ref 0.7–4.0)
Lymphocytes Relative: 19 %
MCH: 36.1 pg — AB (ref 26.0–34.0)
MCHC: 35.7 g/dL (ref 30.0–36.0)
MCV: 101.1 fL — AB (ref 78.0–100.0)
MONO ABS: 0.8 10*3/uL (ref 0.1–1.0)
MONOS PCT: 18 %
Neutro Abs: 2.5 10*3/uL (ref 1.7–7.7)
Neutrophils Relative %: 60 %
PLATELETS: 52 10*3/uL — AB (ref 150–400)
RBC: 3.49 MIL/uL — AB (ref 4.22–5.81)
RDW: 13.1 % (ref 11.5–15.5)
WBC: 4.2 10*3/uL (ref 4.0–10.5)

## 2016-05-20 LAB — URINALYSIS, MICROSCOPIC (REFLEX): WBC UA: NONE SEEN WBC/hpf (ref 0–5)

## 2016-05-20 LAB — COMPREHENSIVE METABOLIC PANEL
ALT: 55 U/L (ref 17–63)
AST: 94 U/L — AB (ref 15–41)
Albumin: 2.4 g/dL — ABNORMAL LOW (ref 3.5–5.0)
Alkaline Phosphatase: 244 U/L — ABNORMAL HIGH (ref 38–126)
Anion gap: 6 (ref 5–15)
BILIRUBIN TOTAL: 1.9 mg/dL — AB (ref 0.3–1.2)
BUN: 15 mg/dL (ref 6–20)
CHLORIDE: 108 mmol/L (ref 101–111)
CO2: 21 mmol/L — ABNORMAL LOW (ref 22–32)
CREATININE: 0.97 mg/dL (ref 0.61–1.24)
Calcium: 8.3 mg/dL — ABNORMAL LOW (ref 8.9–10.3)
GFR calc Af Amer: >60 mL/min (ref >60)
GLUCOSE: 131 mg/dL — AB (ref 65–99)
Potassium: 4 mmol/L (ref 3.5–5.1)
Sodium: 135 mmol/L (ref 135–145)
Total Protein: 7.4 g/dL (ref 6.5–8.1)

## 2016-05-20 LAB — URINALYSIS, ROUTINE W REFLEX MICROSCOPIC
BILIRUBIN URINE: NEGATIVE
GLUCOSE, UA: NEGATIVE mg/dL
Ketones, ur: NEGATIVE mg/dL
Leukocytes, UA: NEGATIVE
Nitrite: NEGATIVE
Protein, ur: NEGATIVE mg/dL
SPECIFIC GRAVITY, URINE: 1.013 (ref 1.005–1.030)
pH: 6.5 (ref 5.0–8.0)

## 2016-05-20 LAB — I-STAT CG4 LACTIC ACID, ED: Lactic Acid, Venous: 1.28 mmol/L (ref 0.5–1.9)

## 2016-05-20 LAB — CK: Total CK: 145 U/L (ref 49–397)

## 2016-05-20 LAB — TROPONIN I: Troponin I: <0.03 ng/mL (ref <0.03)

## 2016-05-20 LAB — AMMONIA: Ammonia: 105 umol/L — ABNORMAL HIGH (ref 9–35)

## 2016-05-20 LAB — LIPASE, BLOOD: LIPASE: 71 U/L — AB (ref 11–51)

## 2016-05-20 MED ORDER — IOPAMIDOL (ISOVUE-300) INJECTION 61%
100.0000 mL | Freq: Once | INTRAVENOUS | Status: AC | PRN
  Administered 2016-05-20: 100 mL via INTRAVENOUS

## 2016-05-20 MED ORDER — ONDANSETRON HCL 4 MG PO TABS: 4.0000 mg | ORAL_TABLET | Freq: Four times a day (QID) | ORAL | 0 refills | Status: DC

## 2016-05-20 MED ORDER — ONDANSETRON HCL 4 MG/2ML IJ SOLN
4.0000 mg | Freq: Once | INTRAMUSCULAR | Status: AC
  Administered 2016-05-20: 4 mg via INTRAVENOUS
  Filled 2016-05-20: qty 2

## 2016-05-20 MED ORDER — SODIUM CHLORIDE 0.9 % IV BOLUS (SEPSIS)
1000.0000 mL | Freq: Once | INTRAVENOUS | Status: AC
  Administered 2016-05-20: 1000 mL via INTRAVENOUS

## 2016-05-20 NOTE — ED Notes (Signed)
Patient taken to u/s - all blood added on to the blood in lab. Patient attempting to urinate for [place].

## 2016-05-20 NOTE — ED Notes (Signed)
Patient transported to CT 

## 2016-05-20 NOTE — ED Provider Notes (Signed)
[facility] DEPT [facility] Provider Note   CSN: [id number] Arrival date & time: 05/20/16  1155   By signing my name below, I, Jose [name], attest that this documentation has been prepared under the direction and in the presence of Jose Decamp, PA-C Electronically Signed: Soijett [name], ED Scribe. 05/20/16. 4:15 PM.  History   Chief Complaint Chief Complaint  Patient presents with  . Abdominal Pain    HPI Jose [name] is a 61 y.o. male with a PMHx of stage 4 CKD, cirrhosis, ascites, hemochromatosis, hepatitis C, HTN, GERD, polysubstance abuse, alcoholism, who presents to the Emergency Department complaining of epigastric abdominal pain onset 2-3 days ago. Pt states that he was recently discharged from the hospital on 05/11/2016. Pt reports that he has a non-painful enlarged area to his right scrotum. He is having associated symptoms of nausea, diarrhea, generalized weakness, cough, and CP. He hasn't tried any medications for the relief for his symptoms. He denies vomiting, fever, chills, and any other symptoms. Denies having a PCP at this time.  Per pt chart review: Pt was admitted to the hospital on 05/10/2016 due to acute hepatic encephalopathy and discharged on 05/11/2016. Pt was given rifaximin 550 mg BID and lactulose 30 mLs TID by Dr. Flonnie Overman Dhungel on 05/11/2016. Pt was a no show for his oncology appointments with Dr. Alvy Bimler on 05/12/2016. Pt visits his oncologist every 4 weeks for phlebotomy.   The history is provided by the patient. No language interpreter was used.    Past Medical History:  Diagnosis Date  . Alcohol abuse   . Alcoholism ([place]) 07/12/2015  . Ascites   . Cirrhosis ([place])   . Depression   . Hepatitis C   . Hypertension   . Shortness of breath    with exertion    Patient Active Problem List   Diagnosis Date Noted  . Elevated lactic acid level 05/10/2016  . Acute hepatic encephalopathy 04/26/2016  . 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 Hepatitis C, chronic ([place])  01/04/2016  . Hernia, inguinal, right 09/12/2015  . Essential hypertension 09/12/2015  . Gastroesophageal reflux disease without esophagitis 09/12/2015  . Protein-calorie malnutrition, severe ([place]) 09/12/2015  . Anemia of chronic disease 09/12/2015  . Arthritis of left knee 09/12/2015  . Cirrhosis of liver with ascites ([place])   . Elevated LFTs   . AKI (acute kidney injury) ([place]) 09/03/2015  . Weakness 09/03/2015  . Generalized weakness   . Physical deconditioning   . Acute-on-chronic renal failure ([place])   . Alcoholic cirrhosis of liver with ascites ([place]) 07/25/2015  . Acute renal failure superimposed on stage 4 chronic kidney disease ([place]) 07/25/2015  . Hypokalemia 07/23/2015  . Macrocytic anemia 07/23/2015  . Alcoholism ([place]) 07/12/2015  . ARF (acute renal failure) ([place]) 06/27/2015  . Metabolic encephalopathy 51/88/4166  . Hepatic encephalopathy ([place]) 06/26/2015  . Trichomonas infection 06/26/2015  . 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 Adenomatous polyps 10/18/2013  . Unspecified vitamin D deficiency 07/13/2013  . Atopic dermatitis 04/28/2013  . Thrombocytopenia ([place]) 04/28/2013  . Anasarca 04/26/2013  . UTI (urinary tract infection) 04/26/2013    Past Surgical History:  Procedure Laterality Date  . CIRCUMCISION    . COLONOSCOPY  march 2015   Dr. Renee Harder: nodular mucosa at appendiceal orifice, tubular adenoma, extremely poor prep       Home Medications    Prior to Admission medications   Medication Sig Start Date End Date Taking? Authorizing Provider  folic acid (FOLVITE) 1 MG tablet Take 1 tablet (1  mg total) by mouth daily. 07/29/15   Barton Dubois, MD  furosemide (LASIX) 40 MG tablet Take 1 tablet (40 mg total) by mouth 2 (two) times daily. 04/28/16   Annita Brod, MD  KLOR-CON M20 20 MEQ tablet Take 1 tablet (20 mEq total) by mouth 2 (two) times daily. 04/28/16   Annita Brod, MD  lactulose (CHRONULAC) 10 GM/15ML solution Take 30 mLs (20 g total) by mouth 3  (three) times daily. 05/11/16   Nishant Dhungel, MD  magnesium oxide (MAG-OX) 400 (241.3 Mg) MG tablet Take 1 tablet (400 mg total) by mouth daily. 09/05/15   Reyne Dumas, MD  methocarbamol (ROBAXIN) 500 MG tablet Take 1 tablet (500 mg total) by mouth 2 (two) times daily. Patient not taking: Reported on 04/26/2016 12/18/15   Lacretia Leigh, MD  Multiple Vitamin (MULTIVITAMIN) tablet Take 1 tablet by mouth daily.    Historical Provider, MD  nicotine (NICODERM CQ - DOSED IN MG/24 HOURS) 21 mg/24hr patch Place 1 patch (21 mg total) onto the skin daily. 05/11/16   Nishant Dhungel, MD  omeprazole (PRILOSEC) 20 MG capsule Take 1 capsule (20 mg total) by mouth daily. 04/28/16   Annita Brod, MD  rifaximin (XIFAXAN) 550 MG TABS tablet Take 1 tablet (550 mg total) by mouth 2 (two) times daily. 05/11/16   Nishant Dhungel, MD  spironolactone (ALDACTONE) 100 MG tablet Take 0.5 tablets (50 mg total) by mouth daily. 04/28/16   Annita Brod, MD    Family History Family History  Problem Relation Age of Onset  . Breast cancer Mother   . Hypertension Mother   . Diabetes Father   . Hypertension Sister   . Heart disease Sister   . Hypertension Paternal Grandmother   . Colon cancer Neg Hx     Social History Social History  Substance Use Topics  . Smoking status: Light Tobacco Smoker    Packs/day: 0.50    Types: Cigarettes  . Smokeless tobacco: Never Used     Comment: STATES HE SMOKES 2-3 TIMES MONTHLY  . Alcohol use Yes     Comment: A couple beers every few days      Allergies   Patient has no known allergies.   Review of Systems Review of Systems  Constitutional: Negative for chills and fever.  Respiratory: Positive for cough.   Cardiovascular: Positive for chest pain.  Gastrointestinal: Positive for abdominal pain (epigastric), diarrhea and nausea. Negative for vomiting.  Genitourinary:       +enlarged area to right scrotum  Neurological: Positive for weakness (generalized).   A  complete 10 system review of systems was obtained and all systems are negative except as noted in the HPI and PMH.   Physical Exam Updated Vital Signs BP 144/77 (BP Location: Left Arm)   Pulse 60   Temp 98.4 F (36.9 C) (Oral)   Resp 16   Ht '5\' 6"'$  (1.676 m)   Wt 181 lb (82.1 kg)   SpO2 100%   BMI 29.21 kg/m   Physical Exam  Constitutional: He is oriented to person, place, and time. He appears well-developed and well-nourished. He has a sickly appearance. No distress.  Appears sickly looking.  HENT:  Head: Normocephalic and atraumatic.  Eyes: EOM are normal.  Neck: Neck supple.  Cardiovascular: Normal rate, regular rhythm and normal heart sounds.  Exam reveals no gallop and no friction rub.   No murmur heard. Pulmonary/Chest: Effort normal and breath sounds normal. No respiratory distress. He has no  wheezes. He has no rales.  Abdominal: Soft. He exhibits no distension. There is tenderness in the epigastric area. Hernia confirmed negative in the right inguinal area.  Tender epigastrium  Genitourinary: Right testis shows swelling. Right testis shows no tenderness.  Genitourinary Comments: Right scrotum enlarged, but non-TTP that feels like fluid on palpation. No hernia appreciated.   Musculoskeletal: Normal range of motion.  Neurological: He is alert and oriented to person, place, and time.  Skin: Skin is warm and dry.  Psychiatric: He has a normal mood and affect. His behavior is normal.  Nursing note and vitals reviewed.  ED Treatments / Results  DIAGNOSTIC STUDIES: Oxygen Saturation is 100% on RA, nl by my interpretation.    COORDINATION OF CARE: 4:16 PM Discussed treatment plan with pt at bedside which includes labs, UA, US scrotum, [place] art/ven flow abd pelv doppler, EKG, and pt agreed to plan.   Labs (all labs ordered are listed, but only abnormal results are displayed) Labs Reviewed  LIPASE, BLOOD - Abnormal; Notable for the following:       Result Value   Lipase 71  (*)    All other components within normal limits  COMPREHENSIVE METABOLIC PANEL - Abnormal; Notable for the following:    CO2 21 (*)    Glucose, Bld 131 (*)    Calcium 8.3 (*)    Albumin 2.4 (*)    AST 94 (*)    Alkaline Phosphatase 244 (*)    Total Bilirubin 1.9 (*)    All other components within normal limits  URINALYSIS, ROUTINE W REFLEX MICROSCOPIC - Abnormal; Notable for the following:    Hgb urine dipstick SMALL (*)    All other components within normal limits  CBC WITH DIFFERENTIAL/PLATELET - Abnormal; Notable for the following:    RBC 3.49 (*)    Hemoglobin 12.6 (*)    HCT 35.3 (*)    MCV 101.1 (*)    MCH 36.1 (*)    Platelets 52 (*)    All other components within normal limits  AMMONIA - Abnormal; Notable for the following:    Ammonia 105 (*)    All other components within normal limits  URINALYSIS, MICROSCOPIC (REFLEX) - Abnormal; Notable for the following:    Bacteria, UA RARE (*)    Squamous Epithelial / LPF 0-5 (*)    All other components within normal limits  CK  TROPONIN I  I-STAT CG4 LACTIC ACID, ED    EKG  EKG Interpretation None       Radiology US Scrotum  Result Date: 05/20/2016 CLINICAL DATA:  Six month history of right testicular pain and swelling EXAM: SCROTAL ULTRASOUND DOPPLER ULTRASOUND OF THE TESTICLES TECHNIQUE: Complete ultrasound examination of the testicles, epididymis, and other scrotal structures was performed. Color and spectral Doppler ultrasound were also utilized to evaluate blood flow to the testicles. COMPARISON:  CT scan 04/26/2013 FINDINGS: Right testicle Measurements: 4.4 x 1.5 x 3.2 cm. No mass or microlithiasis visualized. Left testicle Measurements: 4.7 x 3.1 x 2.5 cm. No mass or microlithiasis visualized. Right epididymis:  Not well seen. Left epididymis:  Normal in size and appearance. Hydrocele: Large volume of fluid with internal low-level echoes identified right hemiscrotum. Septations are seen within the fluid. Pulsed  Doppler interrogation of both testes demonstrates normal low resistance arterial and venous waveforms bilaterally. IMPRESSION: Large complex fluid collection right hemiscrotum. Reviewing the CT scan from 3 years ago, the patient had septated large volume fluid in the right hemiscrotum at that time  as well. Sonographer questions whether there may be bowel in the right inguinal canal today, raising the question of extension of bowel loops into the right inguinal hernia. CT of the abdomen and pelvis could be used to further evaluate as clinically warranted. Electronically Signed   By: Misty Stanley M.D.   On: 05/20/2016 17:01   Ct Abdomen Pelvis W Contrast  Result Date: 05/20/2016 CLINICAL DATA:  62 year old male with history of epigastric pain common nausea, diarrhea and cough. EXAM: CT ABDOMEN AND PELVIS WITH CONTRAST TECHNIQUE: Multidetector CT imaging of the abdomen and pelvis was performed using the standard protocol following bolus administration of intravenous contrast. CONTRAST:  17m ISOVUE-300 IOPAMIDOL (ISOVUE-300) INJECTION 61% COMPARISON:  CT the abdomen and pelvis 04/26/2013. FINDINGS: Lower chest: Atherosclerotic calcifications in the left anterior descending, left circumflex and right coronary arteries. Calcifications of the aortic valve. Numerous large gastroesophageal varices. Hepatobiliary: Liver has a shrunken appearance and nodular contour, compatible with underlying cirrhosis. In the right lobe of the liver there is a well-defined 2.5 x 1.9 cm low-attenuation lesion compatible with a simple cyst. No other suspicious hepatic lesions are noted. No intra or extrahepatic biliary ductal dilatation. Numerous calcified gallstones are noted dependently in the gallbladder. No findings to suggest an acute cholecystitis at this time. Pancreas: No pancreatic mass. No pancreatic ductal dilatation. No pancreatic or peripancreatic fluid or inflammatory changes. Spleen: Unremarkable. Adrenals/Urinary Tract:  Bilateral kidneys and bilateral adrenal glands are normal in appearance. No hydroureteronephrosis. Urinary bladder is normal in appearance. Stomach/Bowel: Stomach is generally normal in appearance although there are multiple large gastric varices. No pathologic dilatation of small bowel or colon. Normal appendix. Large right inguinal hernia with loops of small bowel extending into the right hemiscrotum. Vascular/Lymphatic: Aortic atherosclerosis, without evidence of aneurysm or dissection in the abdominal or pelvic vasculature. Portal vein is dilated measuring up to 21 mm in diameter. Numerous portosystemic collateral pathways are noted, most relevant of which are large gastric and distal esophageal varices. No lymphadenopathy noted in the abdomen or pelvis. Reproductive: Prostate gland and seminal vesicles are unremarkable in appearance. Large hydrocele on the right hemiscrotum. Other: Trace volume of perihepatic ascites. No larger volume of ascites. No pneumoperitoneum. Musculoskeletal: There are no aggressive appearing lytic or blastic lesions noted in the visualized portions of the skeleton. IMPRESSION: 1. No acute findings in the abdomen or pelvis. 2. Large right inguinoscrotal hernia containing a short segment of small bowel, without evidence of bowel incarceration or obstruction at this time. 3. Stigmata of cirrhosis with evidence of portal hypertension, including a large gastric and esophageal varices. 4. Cholelithiasis without evidence of acute cholecystitis at this time. 5. Trace volume of perihepatic ascites. 6. Aortic atherosclerosis, in addition to at least 3 vessel coronary artery disease. Please note that although the presence of coronary artery calcium documents the presence of coronary artery disease, the severity of this disease and any potential stenosis cannot be assessed on this non-gated CT examination. Assessment for potential risk factor modification, dietary therapy or pharmacologic therapy  may be warranted, if clinically indicated. 7. There are calcifications of the aortic valve. Echocardiographic correlation for evaluation of potential valvular dysfunction may be warranted if clinically indicated. Electronically Signed   By: DVinnie LangtonM.D.   On: 05/20/2016 18:35   [place]/ven Flow Abd Pelv Doppler  Result Date: 05/20/2016 CLINICAL DATA:  Six month history of right testicular pain and swelling EXAM: SCROTAL ULTRASOUND DOPPLER ULTRASOUND OF THE TESTICLES TECHNIQUE: Complete ultrasound examination of the testicles, epididymis,  and other scrotal structures was performed. Color and spectral Doppler ultrasound were also utilized to evaluate blood flow to the testicles. COMPARISON:  CT scan 04/26/2013 FINDINGS: Right testicle Measurements: 4.4 x 1.5 x 3.2 cm. No mass or microlithiasis visualized. Left testicle Measurements: 4.7 x 3.1 x 2.5 cm. No mass or microlithiasis visualized. Right epididymis:  Not well seen. Left epididymis:  Normal in size and appearance. Hydrocele: Large volume of fluid with internal low-level echoes identified right hemiscrotum. Septations are seen within the fluid. Pulsed Doppler interrogation of both testes demonstrates normal low resistance arterial and venous waveforms bilaterally. IMPRESSION: Large complex fluid collection right hemiscrotum. Reviewing the CT scan from 3 years ago, the patient had septated large volume fluid in the right hemiscrotum at that time as well. Sonographer questions whether there may be bowel in the right inguinal canal today, raising the question of extension of bowel loops into the right inguinal hernia. CT of the abdomen and pelvis could be used to further evaluate as clinically warranted. Electronically Signed   By: Misty Stanley M.D.   On: 05/20/2016 17:01    Procedures Procedures (including critical care time)  Medications Ordered in ED Medications  sodium chloride 0.9 % bolus 1,000 mL (0 mLs Intravenous Stopped 05/20/16 1703)    iopamidol (ISOVUE-300) 61 % injection 100 mL (100 mLs Intravenous Contrast Given 05/20/16 1809)     Initial Impression / Assessment and Plan / ED Course  I have reviewed the triage vital signs and the nursing notes.  Pertinent labs & imaging results that were available during my care of the patient were reviewed by me and considered in my medical decision making (see chart for details).   I have reviewed and evaluated the relevant laboratory values. I have reviewed and evaluated the relevant imaging studies. I have interpreted the relevant EKG. I have reviewed the relevant previous healthcare records. I obtained HPI from historian. Patient discussed with supervising physician.  ED Course:  Assessment: Pt is a 60yM with hx  stage 4 CKD, cirrhosis, ascites, hemochromatosis, hepatitis C, HTN, GERD, polysubstance abuse, alcoholism who presents with weakness, fatigue, nausea, diarrhea x 2-3 days. Recent admission on 05-10-16 due to hepatic encephalopathy. On exam, pt in NAD. Nontoxic/nonseptic appearing. VSS. Afebrile. Lungs CTA. Heart RRR. Abdomen TTP epigastrium. Also noted incidental right scrotal mass that is non TTP. CBC unremarkable. CMP with elevated alk phos and lipase. No leukocytosis. CK negative. iStat Lactic negative. Trop negative. US Scrotum shows large complex fluid collection on right hemiscrotum. Possible bowel loop extension with inguinal hernia. Recommended CT, which showed no acute findings. Inguinal hernia noted without incarceration or obstruction that has been stable x 3 years. Portal HTN noted with large gastric and esophageal varices. Cholelithiasis without cholecystitis. Trace perihepatic ascites. Ammonia level WNL. Lactic negative. Given fluids in ED. Dicussed with supervising physician. Pt able to tolerate fluids and ambulate. Will DC with follow up to General Surgery for Inguinal hernia. Given anti emetics. At time of discharge, Patient is in no acute distress. Vital Signs are  stable. Patient is able to ambulate. Patient able to tolerate PO.   Disposition/Plan:  DC Home Additional Verbal discharge instructions given and discussed with patient.  Pt Instructed to f/u with PCP in the next week for evaluation and treatment of symptoms. Return precautions given Pt acknowledges and agrees with plan  Supervising Physician Gwenyth Allegra Tegeler, MD  Final Clinical Impressions(s) / ED Diagnoses   Final diagnoses:  No Testicular Torsion  Unilateral recurrent inguinal hernia  without obstruction or gangrene  Epigastric pain    New Prescriptions New Prescriptions   No medications on file   I personally performed the services described in this documentation, which was scribed in my presence. The recorded information has been reviewed and is accurate.      Jose Decamp, PA-C 05/20/16 [place], MD 05/21/16 1050

## 2016-05-20 NOTE — Discharge Instructions (Signed)
Please read and follow all provided instructions.  Your diagnoses today include:  1. Unilateral recurrent inguinal hernia without obstruction or gangrene   2. No Testicular Torsion   3. Epigastric pain     Tests performed today include: Vital signs. See below for your results today.   Medications prescribed:  Take as prescribed   Home care instructions:  Follow any educational materials contained in this packet.  Follow-up instructions: Please follow-up with your primary care provider for further evaluation of symptoms and treatment   Return instructions:  Please return to the Emergency Department if you do not get better, if you get worse, or new symptoms OR  - Fever (temperature greater than 101.25F)  - Bleeding that does not stop with holding pressure to the area    -Severe pain (please note that you may be more sore the day after your accident)  - Chest Pain  - Difficulty breathing  - Severe nausea or vomiting  - Inability to tolerate food and liquids  - Passing out  - Skin becoming red around your wounds  - Change in mental status (confusion or lethargy)  - New numbness or weakness    Please return if you have any other emergent concerns.  Additional Information:  Your vital signs today were: BP 149/91    Pulse 71    Temp 98.4 F (36.9 C) (Oral)    Resp 10    Ht 5\' 6"  (1.676 m)    Wt 82.1 kg    SpO2 100%    BMI 29.21 kg/m  If your blood pressure (BP) was elevated above 135/85 this visit, please have this repeated by your doctor within one month. ---------------

## 2016-05-20 NOTE — ED Triage Notes (Signed)
Per ems that patient has had acute epigastric pain and lower abdominal pain x 2 days  - the patient then states that this is the same pain that he had when he was at [employer].

## 2016-05-20 NOTE — ED Notes (Signed)
Pt seated in w/c-NAD

## 2016-06-02 MED FILL — TRIAMTERENE-HCTZ 37.5-25 MG: 37.5-25 | 30 days supply | Qty: 30 | Fill #1

## 2016-06-02 MED FILL — FUROSEMIDE 40 MG TABLET: 40 | 30 days supply | Qty: 60 | Fill #1

## 2016-06-02 MED FILL — KLOR-CON M20 TABLET: 20 | 30 days supply | Qty: 60 | Fill #1

## 2016-06-02 MED FILL — OMEPRAZOLE DR 20 MG CAPSULE: 20 | 30 days supply | Qty: 30 | Fill #1

## 2016-06-02 MED FILL — GENERLAC 10 GM/15 ML SOLN: 10 | 30 days supply | Qty: 450 | Fill #1

## 2016-06-06 ENCOUNTER — Encounter (HOSPITAL_COMMUNITY): Payer: Self-pay

## 2016-06-06 ENCOUNTER — Inpatient Hospital Stay (HOSPITAL_COMMUNITY)
Admission: EM | Admit: 2016-06-06 | Discharge: 2016-06-10 | DRG: 443 | Disposition: A | Discharge status: Home Health | Payer: Medicaid Other | Attending: Internal Medicine | Admitting: Internal Medicine

## 2016-06-06 ENCOUNTER — Emergency Department (HOSPITAL_COMMUNITY): Payer: Medicaid Other

## 2016-06-06 DIAGNOSIS — Z23 Encounter for immunization: Secondary | ICD-10-CM | POA: Diagnosis not present

## 2016-06-06 DIAGNOSIS — D731 Hypersplenism: Secondary | ICD-10-CM | POA: Diagnosis present

## 2016-06-06 DIAGNOSIS — Z9119 Patient's noncompliance with other medical treatment and regimen: Secondary | ICD-10-CM

## 2016-06-06 DIAGNOSIS — K7210 Chronic hepatic failure without coma: Principal | ICD-10-CM | POA: Diagnosis present

## 2016-06-06 DIAGNOSIS — R531 Weakness: Secondary | ICD-10-CM | POA: Diagnosis present

## 2016-06-06 DIAGNOSIS — Z833 Family history of diabetes mellitus: Secondary | ICD-10-CM

## 2016-06-06 DIAGNOSIS — D696 Thrombocytopenia, unspecified: Secondary | ICD-10-CM | POA: Diagnosis present

## 2016-06-06 DIAGNOSIS — Z8249 Family history of ischemic heart disease and other diseases of the circulatory system: Secondary | ICD-10-CM | POA: Diagnosis not present

## 2016-06-06 DIAGNOSIS — Z803 Family history of malignant neoplasm of breast: Secondary | ICD-10-CM | POA: Diagnosis not present

## 2016-06-06 DIAGNOSIS — K7031 Alcoholic cirrhosis of liver with ascites: Secondary | ICD-10-CM | POA: Diagnosis present

## 2016-06-06 DIAGNOSIS — N183 Chronic kidney disease, stage 3 unspecified: Secondary | ICD-10-CM | POA: Diagnosis present

## 2016-06-06 DIAGNOSIS — R4182 Altered mental status, unspecified: Secondary | ICD-10-CM | POA: Diagnosis present

## 2016-06-06 DIAGNOSIS — E876 Hypokalemia: Secondary | ICD-10-CM | POA: Diagnosis present

## 2016-06-06 DIAGNOSIS — K7290 Hepatic failure, unspecified without coma: Secondary | ICD-10-CM | POA: Diagnosis not present

## 2016-06-06 DIAGNOSIS — F1020 Alcohol dependence, uncomplicated: Secondary | ICD-10-CM | POA: Diagnosis present

## 2016-06-06 DIAGNOSIS — D72819 Decreased white blood cell count, unspecified: Secondary | ICD-10-CM | POA: Diagnosis present

## 2016-06-06 DIAGNOSIS — B182 Chronic viral hepatitis C: Secondary | ICD-10-CM | POA: Diagnosis present

## 2016-06-06 DIAGNOSIS — K7682 Hepatic encephalopathy: Secondary | ICD-10-CM | POA: Diagnosis present

## 2016-06-06 DIAGNOSIS — Z79899 Other long term (current) drug therapy: Secondary | ICD-10-CM

## 2016-06-06 DIAGNOSIS — F1010 Alcohol abuse, uncomplicated: Secondary | ICD-10-CM | POA: Diagnosis not present

## 2016-06-06 DIAGNOSIS — R262 Difficulty in walking, not elsewhere classified: Secondary | ICD-10-CM

## 2016-06-06 HISTORY — DX: Alcohol dependence, uncomplicated: F10.20

## 2016-06-06 HISTORY — DX: Thrombocytopenia, unspecified: D69.6

## 2016-06-06 LAB — CBC WITH DIFFERENTIAL/PLATELET
Basophils Absolute: 0 10*3/uL (ref 0.0–0.1)
Basophils Relative: 1 %
Eosinophils Absolute: 0.1 10*3/uL (ref 0.0–0.7)
Eosinophils Relative: 2 %
HCT: 38.1 % — ABNORMAL LOW (ref 39.0–52.0)
Hemoglobin: 13.3 g/dL (ref 13.0–17.0)
Lymphocytes Relative: 22 %
Lymphs Abs: 0.7 10*3/uL (ref 0.7–4.0)
MCH: 35 pg — ABNORMAL HIGH (ref 26.0–34.0)
MCHC: 34.9 g/dL (ref 30.0–36.0)
MCV: 100.3 fL — ABNORMAL HIGH (ref 78.0–100.0)
Monocytes Absolute: 0.7 10*3/uL (ref 0.1–1.0)
Monocytes Relative: 21 %
Neutro Abs: 1.7 10*3/uL (ref 1.7–7.7)
Neutrophils Relative %: 55 %
Platelets: 53 10*3/uL — ABNORMAL LOW (ref 150–400)
RBC: 3.8 MIL/uL — ABNORMAL LOW (ref 4.22–5.81)
RDW: 14.5 % (ref 11.5–15.5)
WBC: 3.2 10*3/uL — ABNORMAL LOW (ref 4.0–10.5)

## 2016-06-06 LAB — ETHANOL: Alcohol, Ethyl (B): <5 mg/dL (ref <5)

## 2016-06-06 LAB — URINALYSIS, ROUTINE W REFLEX MICROSCOPIC
Bilirubin Urine: NEGATIVE
Glucose, UA: NEGATIVE mg/dL
Hgb urine dipstick: NEGATIVE
Ketones, ur: NEGATIVE mg/dL
Leukocytes, UA: NEGATIVE
Nitrite: NEGATIVE
Protein, ur: NEGATIVE mg/dL
Specific Gravity, Urine: 1.011 (ref 1.005–1.030)
pH: 7 (ref 5.0–8.0)

## 2016-06-06 LAB — COMPREHENSIVE METABOLIC PANEL
ALT: 44 U/L (ref 17–63)
AST: 93 U/L — ABNORMAL HIGH (ref 15–41)
Albumin: 2.1 g/dL — ABNORMAL LOW (ref 3.5–5.0)
Alkaline Phosphatase: 266 U/L — ABNORMAL HIGH (ref 38–126)
Anion gap: 7 (ref 5–15)
BUN: 12 mg/dL (ref 6–20)
CO2: 23 mmol/L (ref 22–32)
Calcium: 8.1 mg/dL — ABNORMAL LOW (ref 8.9–10.3)
Chloride: 111 mmol/L (ref 101–111)
Creatinine, Ser: 1.02 mg/dL (ref 0.61–1.24)
GFR calc Af Amer: 50 mL/min — ABNORMAL LOW (ref >60)
GFR calc non Af Amer: 43 mL/min — ABNORMAL LOW (ref >60)
Glucose, Bld: 113 mg/dL — ABNORMAL HIGH (ref 65–99)
Potassium: 3.5 mmol/L (ref 3.5–5.1)
Sodium: 141 mmol/L (ref 135–145)
Total Bilirubin: 1.6 mg/dL — ABNORMAL HIGH (ref 0.3–1.2)
Total Protein: 6.6 g/dL (ref 6.5–8.1)

## 2016-06-06 LAB — CBG MONITORING, ED: Glucose-Capillary: 99 mg/dL (ref 65–99)

## 2016-06-06 LAB — AMMONIA: Ammonia: 187 umol/L — ABNORMAL HIGH (ref 9–35)

## 2016-06-06 MED ORDER — LORAZEPAM 1 MG PO TABS
1.0000 mg | ORAL_TABLET | Freq: Four times a day (QID) | ORAL | Status: AC | PRN
Start: 2016-06-06 — End: 2016-06-09

## 2016-06-06 MED ORDER — ADULT MULTIVITAMIN W/MINERALS CH
1.0000 | ORAL_TABLET | Freq: Every day | ORAL | Status: DC
  Administered 2016-06-06 – 2016-06-10 (×5): 1 via ORAL
  Filled 2016-06-06 (×5): qty 1

## 2016-06-06 MED ORDER — RIFAXIMIN 550 MG PO TABS
550.0000 mg | ORAL_TABLET | Freq: Two times a day (BID) | ORAL | Status: DC
  Administered 2016-06-06 – 2016-06-10 (×8): 550 mg via ORAL
  Filled 2016-06-06 (×8): qty 1

## 2016-06-06 MED ORDER — LACTULOSE 10 GM/15ML PO SOLN
30.0000 g | Freq: Once | ORAL | Status: AC
  Administered 2016-06-06: 30 g via ORAL
  Filled 2016-06-06: qty 45

## 2016-06-06 MED ORDER — FOLIC ACID 1 MG PO TABS
1.0000 mg | ORAL_TABLET | Freq: Every day | ORAL | Status: DC
  Administered 2016-06-06 – 2016-06-10 (×5): 1 mg via ORAL
  Filled 2016-06-06 (×5): qty 1

## 2016-06-06 MED ORDER — LORAZEPAM 2 MG/ML IJ SOLN: 1.0000 mg | Freq: Four times a day (QID) | INTRAMUSCULAR | Status: AC | PRN

## 2016-06-06 MED ORDER — LACTULOSE 10 GM/15ML PO SOLN
20.0000 g | ORAL | Status: AC
  Administered 2016-06-06 (×2): 20 g via ORAL
  Filled 2016-06-06: qty 30

## 2016-06-06 MED ORDER — VITAMIN B-1 100 MG PO TABS
100.0000 mg | ORAL_TABLET | Freq: Every day | ORAL | Status: DC
  Administered 2016-06-06 – 2016-06-10 (×5): 100 mg via ORAL
  Filled 2016-06-06 (×5): qty 1

## 2016-06-06 MED ORDER — ONDANSETRON HCL 4 MG PO TABS: 4.0000 mg | ORAL_TABLET | Freq: Four times a day (QID) | ORAL | Status: DC | PRN

## 2016-06-06 MED ORDER — FOLIC ACID 1 MG PO TABS: 1.0000 mg | ORAL_TABLET | Freq: Every day | ORAL | Status: DC

## 2016-06-06 MED ORDER — THIAMINE HCL 100 MG/ML IJ SOLN: 100.0000 mg | Freq: Every day | INTRAMUSCULAR | Status: DC

## 2016-06-06 MED ORDER — ONDANSETRON HCL 4 MG/2ML IJ SOLN: 4.0000 mg | Freq: Four times a day (QID) | INTRAMUSCULAR | Status: DC | PRN

## 2016-06-06 MED ORDER — SODIUM CHLORIDE 0.9 % IV SOLN
INTRAVENOUS | Status: DC
  Administered 2016-06-06: 16:00:00 via INTRAVENOUS

## 2016-06-06 MED ORDER — LACTULOSE 10 GM/15ML PO SOLN
30.0000 g | Freq: Three times a day (TID) | ORAL | Status: DC
  Filled 2016-06-06: qty 45

## 2016-06-06 NOTE — ED Triage Notes (Signed)
Pt arrives awake unable to answer questions at times such as his birthday

## 2016-06-06 NOTE — ED Provider Notes (Signed)
[facility] DEPT Provider Note   CSN: [id number] Arrival date & time: 06/06/16  1125  By signing my name below, I, Reola Mosher, attest that this documentation has been prepared under the direction and in the presence of Virgel Manifold, MD. Electronically Signed: Reola Mosher, ED Scribe. 06/06/16. 11:36 AM.  History   Chief Complaint Chief Complaint  Patient presents with  . Failure To Thrive    family sent in for failure to thirve and aggressive behavior at home    LEVEL V CAVEAT: HPI and ROS limited due to altered mental status  The history is provided by the patient and the EMS personnel. History limited by: mental status change. No language interpreter was used.    HPI Comments: Jose [name] is a 61yo. male BIB EMS, who presents to the Emergency Department with increased agitation beginning this morning. Per EMS, they were called to the pt's location by family d/t increased agitation and aggressiveness which began this morning. His relatives had reported that he was screaming at them, being increasingly combative, and not acting at his baseline. No known last seen normal. No reported trauma. Pt's family also noted to EMS that he has a h/o cancer; however, pt is unable to state which type at bedside. Pt denies having drank alcohol today or daily history of drinking; however, EMS reported that there were multiple 40oz bottles of beer on scene.   History reviewed. No pertinent past medical history.  There are no active problems to display for this patient.  History reviewed. No pertinent surgical history.  Home Medications    Prior to Admission medications   Not on File   Family History No family history on file.  Social History Social History  Substance Use Topics  . Smoking status: Unknown If Ever Smoked  . Smokeless tobacco: Never Used  . Alcohol use Not on file   Allergies   Patient has no allergy information on record.  Review of  Systems Review of Systems  Unable to perform ROS: Mental status change   Physical Exam Updated Vital Signs BP 126/75 (BP Location: Right Arm)   Pulse (!) 51   Temp 97.8 F (36.6 C) (Oral)   Resp 12   Ht 5\' 7"  (1.702 m)   Wt 175 lb (79.4 kg)   SpO2 100%   BMI 27.41 kg/m   Physical Exam  Constitutional:  Smelling of urine and EtOH on exam.  HENT:  Head: Normocephalic.  Right Ear: External ear normal.  Left Ear: External ear normal.  Nose: Nose normal.  Eyes: Conjunctivae are normal. Right eye exhibits no discharge. Left eye exhibits no discharge.  Neck: Normal range of motion.  Cardiovascular: Normal rate, regular rhythm and normal heart sounds.   No murmur heard. Pulmonary/Chest: Effort normal and breath sounds normal. No respiratory distress. He has no wheezes. He has no rales.  Abdominal: Soft. He exhibits distension. There is no tenderness. There is no rebound and no guarding.  Small reducible umbilical hernia. Mild abdominal distension, otherwise soft and non-tender.   Musculoskeletal: Normal range of motion. He exhibits no edema or tenderness.  Neurological: He is alert.  Drowsy, but will open eyes to voice. Pt oriented to place. Moves all extremities and follows all commands.   Skin: Skin is warm and dry. No rash noted. No erythema. No pallor.  Psychiatric: He has a normal mood and affect. His behavior is normal.  Nursing note and vitals reviewed.  ED Treatments / Results  DIAGNOSTIC  STUDIES: Oxygen Saturation is 100% on RA, normal by my interpretation.   Labs (all labs ordered are listed, but only abnormal results are displayed) Labs Reviewed  CBC WITH DIFFERENTIAL/PLATELET - Abnormal; Notable for the following:       Result Value   WBC 3.2 (*)    RBC 3.80 (*)    HCT 38.1 (*)    MCV 100.3 (*)    MCH 35.0 (*)    Platelets 53 (*)    All other components within normal limits  COMPREHENSIVE METABOLIC PANEL - Abnormal; Notable for the following:    Glucose,  Bld 113 (*)    Calcium 8.1 (*)    Albumin 2.1 (*)    AST 93 (*)    Alkaline Phosphatase 266 (*)    Total Bilirubin 1.6 (*)    GFR calc non Af Amer 43 (*)    GFR calc Af Amer 50 (*)    All other components within normal limits  AMMONIA - Abnormal; Notable for the following:    Ammonia 187 (*)    All other components within normal limits  URINALYSIS, ROUTINE W REFLEX MICROSCOPIC  ETHANOL  CBG MONITORING, ED    EKG  EKG Interpretation None      Radiology Ct Head Wo Contrast  Result Date: 06/06/2016 CLINICAL DATA:  Altered mental status. EXAM: CT HEAD WITHOUT CONTRAST TECHNIQUE: Contiguous axial images were obtained from the base of the skull through the vertex without intravenous contrast. COMPARISON:  None. FINDINGS: Brain: Mild chronic microvascular disease throughout the deep white matter. No acute intracranial abnormality. Specifically, no hemorrhage, hydrocephalus, mass lesion, acute infarction, or significant intracranial injury. Vascular: No hyperdense vessel or unexpected calcification. Skull: No acute calvarial abnormality. Sinuses/Orbits: Old left medial orbital wall blowout fracture. Paranasal sinuses and mastoids are clear. Other: None IMPRESSION: Mild chronic small vessel disease throughout the deep white matter. No acute intracranial abnormality. Electronically Signed   By: Rolm Baptise M.D.   On: 06/06/2016 13:09    Procedures Procedures   Medications Ordered in ED Medications - No data to display  Initial Impression / Assessment and Plan / ED Course  I have reviewed the triage vital signs and the nursing notes.  Pertinent labs & imaging results that were available during my care of the patient were reviewed by me and considered in my medical decision making (see chart for details).     [age]-year-old male with altered mental status. Clinically, this is likely hepatic encephalopathy. His ammonia level came back very elevated. Suspect he has a history of alcohol  abuse (EMS reports the place they picked him up from "looked like car."). Several of his other lab abnormalities could be explained by this (hypoalbuminemia, transaminitis, thrombocytopenia, etc.). There is no reported trauma. No external signs of trauma on exam. CT was head is without any acute abnormality. Will start lactulose. At this time, patient is too altered to be safely discharged. Will admit.  Final Clinical Impressions(s) / ED Diagnoses   Final diagnoses:  Hepatic encephalopathy ([place])   New Prescriptions New Prescriptions   No medications on file   I personally preformed the services scribed in my presence. The recorded information has been reviewed is accurate. Virgel Manifold, MD.     Virgel Manifold, MD 06/18/16 [phone]

## 2016-06-06 NOTE — Progress Notes (Signed)
Report received from [place], [place] from ED for admission to [phone]

## 2016-06-06 NOTE — H&P (Addendum)
History and Physical  Patient Name: Jose [name]     PNT:[id number]    DOB: 10-Apr-1956    DOA: 06/06/2016 PCP: Carmie Kanner, NP   Patient coming from: Home via EMS  Chief Complaint: Altered mental status  HPI: Jose [name] is a 61 y.o. 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  Caveat that patient is somnolent and no history can be obtained from him.  He has a duplicate chart, which shows that he has frequent admissions for hepatic encephalopathy, most recently about 2 weeks ago because of having missed his lactulose and rifaximin for several days.  He was kept here for overnight, got lactulose and fluids and mental status improved and he was discharged back to home.  Notes from that hospitalization suggest that he lives both "at a halfway house" and that "sister at bedside concerned that patient does not have a proper place to live."  Today, EMS were called out to a home for reports by family that patient was acting aggressively and seemed disoriented.  EMS found numerous alcohol bottles around, able to answer some simple questions, appeared intoxicated.   ED course: -Afebrile, heart rate 51, respirations and pulse ox normal, BP 126/75 -Na 141, K 3.5, Cr 1.02 (baseline 1.1), WBC 3.2K, Hgb 13.3 -Platelets 53K (at baseline) -AST, Alk phos, Tbili at baseline -Ammonia 187 -UA clear -Non-contrasted CT head NAICP -Alcohol level negative -He was given lactulose and TRH were asked to evaluate for HE     ROS: Review of Systems  Unable to perform ROS: Mental status change          Past Medical History:  Diagnosis Date  . Alcohol dependence ([place])   . Ascites   . Cirrhosis ([place])   . Hepatitis C   . Thrombocytopenia ([place])     Past Surgical History:  Procedure Laterality Date  . CIRCUMCISION    . COLONOSCOPY      Social History:  Unable to obtain from patient.   NKDA in previous hospitalizations, unable to verify with patient.  Family history: Unable to obtain from patient.  In chart, HTN, breast cancer and diabetes are listed.   Prior to Admission medications   Medication Sig Start Date End Date Taking? Authorizing Provider  folic acid (FOLVITE) 1 MG tablet Take 1 mg by mouth daily.   Yes Historical Provider, MD  furosemide (LASIX) 40 MG tablet Take 40 mg by mouth 2 (two) times daily.   Yes Historical Provider, MD  lactulose (CHRONULAC) 10 GM/15ML solution Take 30 g by mouth 3 (three) times daily.   Yes Historical Provider, MD  rifaximin (XIFAXAN) 550 MG TABS tablet Take 550 mg by mouth 2 (two) times daily.   Yes Historical Provider, MD  spironolactone (ALDACTONE) 100 MG tablet Take 50 mg by mouth daily.   Yes Historical Provider, MD       Physical Exam: BP 111/62 (BP Location: Right Arm)   Pulse (!) 52   Temp 98.3 F (36.8 C) (Rectal)   Resp 13   Ht '5\' 7"'$  (1.702 m)   Wt 79.4 kg (175 lb)   SpO2 97%   BMI 27.41 kg/m  General appearance: Well-developed, adult male, somnolent. Squeezes hands to command, otherwise does not open eyes or answer questions. Eyes: Anicteric, conjunctiva inflamed, lids and lashes normal. PERRL.    ENT: No nasal deformity, discharge, epistaxis.  Hearing unable to assess. OP appears normal, will not open mouth.   Skin: Warm and dry. No suspicious rashes or lesions. Cardiac: RRR, nl S1-S2, SEM noted.  Capillary refill is brisk.  No JVD.  No LE edema.  Radial and DP pulses 2+ and symmetric. Respiratory: Normal respiratory rate and rhythm. Shallow inspiratory effort, no rales or wheezes appreciated. Abdomen: Abdomen soft.  No TTP. Moderate distension, presumed ascites, soft.    MSK: No deformities or effusions.  No cyanosis or clubbing. Neuro: Eyes closed.  Only follows commands to squeeze hands for me, otherwise appears to hear commands, just sleeps. Psych: Unable to  assess.     Labs on Admission:  I have personally reviewed following labs and imaging studies: CBC:  Recent Labs Lab 06/06/16 1140  WBC 3.2*  NEUTROABS 1.7  HGB 13.3  HCT 38.1*  MCV 100.3*  PLT 53*   Basic Metabolic Panel:  Recent Labs Lab 06/06/16 1140  NA 141  K 3.5  CL 111  CO2 23  GLUCOSE 113*  BUN 12  CREATININE 1.02  CALCIUM 8.1*   GFR: Estimated Creatinine Clearance: 77.8 mL/min (by C-G formula based on SCr of 1.02 mg/dL).  Liver Function Tests:  Recent Labs Lab 06/06/16 1140  AST 93*  ALT 44  ALKPHOS 266*  BILITOT 1.6*  PROT 6.6  ALBUMIN 2.1*   No results for input(s): LIPASE, AMYLASE in the last 168 hours.  Recent Labs Lab 06/06/16 1140  AMMONIA 187*   Coagulation Profile: No results for input(s): INR, PROTIME in the last 168 hours. Cardiac Enzymes: No results for input(s): CKTOTAL, CKMB, CKMBINDEX, TROPONINI in the last 168 hours. BNP (last 3 results) No results for input(s): PROBNP in the last 8760 hours. HbA1C: No results for input(s): HGBA1C in the last 72 hours. CBG:  Recent Labs Lab 06/06/16 1147  GLUCAP 99   Lipid Profile: No results for input(s): CHOL, HDL, LDLCALC, TRIG, CHOLHDL, LDLDIRECT in the last 72 hours. Thyroid Function Tests: No results for input(s): TSH, T4TOTAL, FREET4, T3FREE, THYROIDAB in the last 72 hours. Anemia Panel: No results for input(s): VITAMINB12, FOLATE, FERRITIN, TIBC, IRON, RETICCTPCT in the last 72 hours. Sepsis Labs: Invalid input(s): PROCALCITONIN, LACTICIDVEN No results found for this or any previous visit (from the past 240 hour(s)).       Radiological Exams on Admission: Personally reviewed CT report: Ct Head Wo Contrast  Result Date: 06/06/2016 CLINICAL DATA:  Altered mental status. EXAM: CT HEAD WITHOUT CONTRAST TECHNIQUE: Contiguous axial images were obtained from the base of the skull through the vertex without intravenous contrast. COMPARISON:  None. FINDINGS: Brain: Mild chronic  microvascular disease throughout the deep white matter. No acute intracranial abnormality. Specifically, no hemorrhage, hydrocephalus, mass lesion, acute infarction, or significant intracranial injury. Vascular: No hyperdense vessel or unexpected calcification. Skull: No acute calvarial abnormality. Sinuses/Orbits: Old left medial orbital wall blowout fracture. Paranasal sinuses and mastoids are clear. Other: None IMPRESSION: Mild chronic small vessel disease throughout the deep white matter. No acute intracranial abnormality. Electronically Signed   By: Rolm Baptise M.D.   On: 06/06/2016 13:09        Assessment/Plan  1. Hepatic encephalopathy:  Frequent HE episodes, unclear compliance.     -Lactulose 20g q1 hrs for 2 more doses -Lactulose TID tomorrow -Restart rifaximin -Gentle fluids this evening    2. Hep C and alcoholic cirrhosis with ascites:  Ascites appears asymptomatic.  No evidence of SBP at this time. -Hold diuretics tonight  3. CKD III:  Creatinine at basline ~1.0-1.1  4. Thrombocytopenia:  Stable.  5. Alcohol abuse: -Monitor on CIWA     DVT prophylaxis: SCDs given platelets Code Status: FULL  Family Communication: None present.  Called to sister listed in chart, no answer  Disposition Plan: Anticipate lactulose and IV fluids tonight, re-eval tomorrow. Consults called: None Admission status: INPATIENT        Medical decision making: Patient seen at 2:03 PM on 06/06/2016.  The patient was discussed with Dr. Wilson Singer.  What exists of the patient's chart was reviewed in depth and summarized above.  Clinical condition: stable.        Edwin Dada [facility] Pager [phone]      At the time of admission, it appears that the appropriate admission status for this patient is INPATIENT. This is judged to be reasonable and necessary in order to provide the required intensity of service to ensure the patient's safety given the presenting  symptoms, physical exam findings, and initial radiographic and laboratory data in the context of their chronic comorbidities.  Together, these circumstances are felt to place him at high risk for further clinical deterioration threatening life, limb, or organ.   Patient requires inpatient status due to high intensity of service, high risk for further deterioration and high frequency of surveillance required because of this severe exacerbation of their chronic organ failure.  Factors that support inpatient status include: Chronic liver failure with recurrent admissions for hepatic encephalopathy, currently somnolent and I certify that at the point of admission it is my clinical judgment that the patient will require inpatient hospital care spanning beyond 2 midnights from the point of admission and that early discharge would result in unnecessary risk of decompensation and readmission or threat to life, limb or bodily function.

## 2016-06-07 DIAGNOSIS — K7031 Alcoholic cirrhosis of liver with ascites: Secondary | ICD-10-CM

## 2016-06-07 DIAGNOSIS — K7290 Hepatic failure, unspecified without coma: Secondary | ICD-10-CM

## 2016-06-07 DIAGNOSIS — F1010 Alcohol abuse, uncomplicated: Secondary | ICD-10-CM

## 2016-06-07 DIAGNOSIS — D696 Thrombocytopenia, unspecified: Secondary | ICD-10-CM

## 2016-06-07 LAB — CBC
HCT: 36.6 % — ABNORMAL LOW (ref 39.0–52.0)
Hemoglobin: 12.6 g/dL — ABNORMAL LOW (ref 13.0–17.0)
MCH: 34.5 pg — ABNORMAL HIGH (ref 26.0–34.0)
MCHC: 34.4 g/dL (ref 30.0–36.0)
MCV: 100.3 fL — ABNORMAL HIGH (ref 78.0–100.0)
PLATELETS: 61 10*3/uL — AB (ref 150–400)
RBC: 3.65 MIL/uL — ABNORMAL LOW (ref 4.22–5.81)
RDW: 14.5 % (ref 11.5–15.5)
WBC: 3.2 10*3/uL — AB (ref 4.0–10.5)

## 2016-06-07 LAB — COMPREHENSIVE METABOLIC PANEL
ALT: 43 U/L (ref 17–63)
AST: 88 U/L — ABNORMAL HIGH (ref 15–41)
Albumin: 1.9 g/dL — ABNORMAL LOW (ref 3.5–5.0)
Alkaline Phosphatase: 216 U/L — ABNORMAL HIGH (ref 38–126)
Anion gap: 10 (ref 5–15)
BUN: 11 mg/dL (ref 6–20)
CO2: 20 mmol/L — ABNORMAL LOW (ref 22–32)
Calcium: 7.7 mg/dL — ABNORMAL LOW (ref 8.9–10.3)
Chloride: 108 mmol/L (ref 101–111)
Creatinine, Ser: 1.04 mg/dL (ref 0.61–1.24)
GFR calc Af Amer: >60 mL/min (ref >60)
GFR calc non Af Amer: >60 mL/min (ref >60)
Glucose, Bld: 150 mg/dL — ABNORMAL HIGH (ref 65–99)
Potassium: 3.6 mmol/L (ref 3.5–5.1)
Sodium: 138 mmol/L (ref 135–145)
Total Bilirubin: 1.5 mg/dL — ABNORMAL HIGH (ref 0.3–1.2)
Total Protein: 6.2 g/dL — ABNORMAL LOW (ref 6.5–8.1)

## 2016-06-07 MED ORDER — PNEUMOCOCCAL VAC POLYVALENT 25 MCG/0.5ML IJ INJ
0.5000 mL | INJECTION | INTRAMUSCULAR | Status: AC
  Administered 2016-06-08: 0.5 mL via INTRAMUSCULAR
  Filled 2016-06-07: qty 0.5

## 2016-06-07 MED ORDER — SPIRONOLACTONE 100 MG PO TABS
100.0000 mg | ORAL_TABLET | Freq: Every day | ORAL | Status: DC
  Administered 2016-06-07 – 2016-06-08 (×2): 100 mg via ORAL
  Filled 2016-06-07 (×2): qty 1

## 2016-06-07 MED ORDER — FUROSEMIDE 40 MG PO TABS
40.0000 mg | ORAL_TABLET | Freq: Every day | ORAL | Status: DC
  Administered 2016-06-07 – 2016-06-10 (×4): 40 mg via ORAL
  Filled 2016-06-07 (×4): qty 1

## 2016-06-07 MED ORDER — LACTULOSE 10 GM/15ML PO SOLN
45.0000 g | Freq: Three times a day (TID) | ORAL | Status: DC
  Administered 2016-06-07 – 2016-06-08 (×4): 45 g via ORAL
  Filled 2016-06-07 (×4): qty 90

## 2016-06-07 NOTE — Progress Notes (Signed)
PROGRESS NOTE        PATIENT DETAILS Name: Jose [name] Age: 61 y.o. Sex: male Date of Birth: 1956/02/12 Admit Date: 06/06/2016 Admitting Physician Edwin Dada, MD [id number] H, NP  Brief Narrative: Patient is a 61 y.o. male with history of hemachromatosis, appetite is C, ongoing alcohol use, cirrhosis, admitted for altered mental status, thought to have acute encephalopathy. See below for further details  Subjective: Awake and alert. Reluctantly acknowledges that he still consumes alcohol-claims last drink was "just a few bottles of beer" yesterday  Assessment/Plan: Hepatic encephalopathy: Encephalopathy has resolved, he is much more awake and alert. Increase lactulose to 45 g 3 times a day, continue rifaximin. Suspect noncompliance/ongoing alcohol use as the etiology for encephalopathy-he does acknowledge a running out of his medications a few days back and missed a few doses before he got it refilled. He appears deconditioned, and will need a PT evaluation prior to discharge.   Liver cirrhosis: Probably due to hep C and alcoholism. He apparently also has a history of hemachromatosis. Resume Aldactone and Lasix. He does have ascites-but abdomen is not tense-do not see any indication for paracentesis at this time.  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 Appears at baseline. Follow periodically.  History of hemachromatosis: On therapeutic phlebotomy as an outpatient-resume on discharge.  Alcohol abuse: Reluctantly acknowledges still consuming alcohol-initially he denied any alcohol consumption. No signs of withdrawal at this time, continue Ativan per protocol.  Physical deconditioning/generalized weakness: Await PT evaluation.  DVT Prophylaxis: SCD's  Code Status: Full code   Family Communication: None at bedside  Disposition Plan: Remain inpatient-but will plan on Home health vs SNF on  discharge-likely 2/11 or 2/12  Antimicrobial agents: Anti-infectives    Start     Dose/Rate Route Frequency Ordered Stop   06/06/16 2200  rifaximin (XIFAXAN) tablet 550 mg     550 mg Oral 2 times daily 06/06/16 1549        Procedures: None  CONSULTS:  None  Time spent: 25 minutes-Greater than 50% of this time was spent in counseling, explanation of diagnosis, planning of further management, and coordination of care.  MEDICATIONS: Scheduled Meds: . folic acid  1 mg Oral Daily  . furosemide  40 mg Oral Daily  . lactulose  45 g Oral TID  . multivitamin with minerals  1 tablet Oral Daily  . rifaximin  550 mg Oral BID  . spironolactone  100 mg Oral Daily  . thiamine  100 mg Oral Daily   Continuous Infusions: PRN Meds:.LORazepam **OR** LORazepam, ondansetron **OR** ondansetron (ZOFRAN) IV   PHYSICAL EXAM: Vital signs: Vitals:   06/06/16 1430 06/06/16 1445 06/06/16 1530 06/07/16 0425  BP: 106/75 106/69 125/80 119/68  Pulse: (!) 47 (!) 44 (!) 47 (!) 59  Resp: 12 11 16  (P) 19  Temp:   97.3 F (36.3 C) (P) 98.1 F (36.7 C)  TempSrc:   Oral (P) Oral  SpO2: 100% 100% 100% (P) 100%  Weight:      Height:       Filed Weights   06/06/16 1133  Weight: 79.4 kg (175 lb)   Body mass index is 27.41 kg/m.   General appearance :Awake, alert, not in any distress. Speech Clear. Chronically sick appearing Eyes:, pupils equally reactive to light HEENT: Atraumatic and Normocephalic Neck: supple, no JVD. No cervical  lymphadenopathy.  Resp:Good air entry bilaterally, no added sounds  CVS: S1 S2 regular, no murmurs.  GI: Bowel sounds present, slightly distended with dullness in the flanks. Abdomen is nontender. Extremities: B/L Lower Ext shows trace edema edema, both legs are warm to touch Neurology:  Nonfocal exam-but has generalized weakness Musculoskeletal:No digital cyanosis Skin:No Rash, warm and dry Wounds:N/A  I have personally reviewed following labs and imaging  studies  LABORATORY DATA: CBC:  Recent Labs Lab 06/06/16 1140 06/07/16 0437  WBC 3.2* 3.2*  NEUTROABS 1.7  --   HGB 13.3 12.6*  HCT 38.1* 36.6*  MCV 100.3* 100.3*  PLT 53* 61*    Basic Metabolic Panel:  Recent Labs Lab 06/06/16 1140 06/07/16 0437  NA 141 138  K 3.5 3.6  CL 111 108  CO2 23 20*  GLUCOSE 113* 150*  BUN 12 11  CREATININE 1.02 1.04  CALCIUM 8.1* 7.7*    GFR: Estimated Creatinine Clearance: 76.3 mL/min (by C-G formula based on SCr of 1.04 mg/dL).  Liver Function Tests:  Recent Labs Lab 06/06/16 1140 06/07/16 0437  AST 93* 88*  ALT 44 43  ALKPHOS 266* 216*  BILITOT 1.6* 1.5*  PROT 6.6 6.2*  ALBUMIN 2.1* 1.9*   No results for input(s): LIPASE, AMYLASE in the last 168 hours.  Recent Labs Lab 06/06/16 1140  AMMONIA 187*    Coagulation Profile: No results for input(s): INR, PROTIME in the last 168 hours.  Cardiac Enzymes: No results for input(s): CKTOTAL, CKMB, CKMBINDEX, TROPONINI in the last 168 hours.  BNP (last 3 results) No results for input(s): PROBNP in the last 8760 hours.  HbA1C: No results for input(s): HGBA1C in the last 72 hours.  CBG:  Recent Labs Lab 06/06/16 1147  GLUCAP 99    Lipid Profile: No results for input(s): CHOL, HDL, LDLCALC, TRIG, CHOLHDL, LDLDIRECT in the last 72 hours.  Thyroid Function Tests: No results for input(s): TSH, T4TOTAL, FREET4, T3FREE, THYROIDAB in the last 72 hours.  Anemia Panel: No results for input(s): VITAMINB12, FOLATE, FERRITIN, TIBC, IRON, RETICCTPCT in the last 72 hours.  Urine analysis:    Component Value Date/Time   COLORURINE YELLOW 06/06/2016 1140   APPEARANCEUR CLEAR 06/06/2016 1140   LABSPEC 1.011 06/06/2016 1140   PHURINE 7.0 06/06/2016 1140   GLUCOSEU NEGATIVE 06/06/2016 1140   HGBUR NEGATIVE 06/06/2016 1140   BILIRUBINUR NEGATIVE 06/06/2016 1140   KETONESUR NEGATIVE 06/06/2016 1140   PROTEINUR NEGATIVE 06/06/2016 1140   NITRITE NEGATIVE 06/06/2016 1140    LEUKOCYTESUR NEGATIVE 06/06/2016 1140    Sepsis Labs: Lactic Acid, Venous No results found for: LATICACIDVEN  MICROBIOLOGY: No results found for this or any previous visit (from the past 240 hour(s)).  RADIOLOGY STUDIES/RESULTS: Ct Head Wo Contrast  Result Date: 06/06/2016 CLINICAL DATA:  Altered mental status. EXAM: CT HEAD WITHOUT CONTRAST TECHNIQUE: Contiguous axial images were obtained from the base of the skull through the vertex without intravenous contrast. COMPARISON:  None. FINDINGS: Brain: Mild chronic microvascular disease throughout the deep white matter. No acute intracranial abnormality. Specifically, no hemorrhage, hydrocephalus, mass lesion, acute infarction, or significant intracranial injury. Vascular: No hyperdense vessel or unexpected calcification. Skull: No acute calvarial abnormality. Sinuses/Orbits: Old left medial orbital wall blowout fracture. Paranasal sinuses and mastoids are clear. Other: None IMPRESSION: Mild chronic small vessel disease throughout the deep white matter. No acute intracranial abnormality. Electronically Signed   By: Rolm Baptise M.D.   On: 06/06/2016 13:09     LOS: 1 day   Oren Binet,  MD  [facility] Pager:[phone]  If 7PM-7AM, please contact night-coverage [email or web address] Password [email or web address] 06/07/2016, 10:15 AM

## 2016-06-07 NOTE — Evaluation (Signed)
Physical Therapy Evaluation Patient Details Name: Jose [name] MRN: [id number] DOB: April 24, 1956 Today's Date: 06/07/2016   History of Present Illness  Pt is 61 yo male admitted on 06/06/16 through ED via EMS from home.  PMH significant for alcohol cirrhosis, chronic hep c, CKD III, thrombocytopenia.   Clinical Impression  Pt presents with the above diagnosis and below deficits. Prior to admission, pt was living with friends and was continuing to have issues with drinking. Pt has been working with his sister to get placed in an inpatient recovery center. Pt is supervision to min guard for the majority of mobility this session and will benefit from continued acute PT services in order to maximize his functional outcomes. Pt may benefit from SNF placement pending his recovery during this acute stay.     Follow Up Recommendations Other (comment);Supervision for mobility/OOB;SNF (Pt reports going to inpatient rehab for alcohol dependency )    Equipment Recommendations  Rolling walker with 5" wheels;3in1 (PT)    Recommendations for Other Services       Precautions / Restrictions Precautions Precautions: Fall Precaution Comments: h/o alcoholism Restrictions Weight Bearing Restrictions: No      Mobility  Bed Mobility Overal bed mobility: Needs Assistance Bed Mobility: Supine to Sit     Supine to sit: Supervision     General bed mobility comments: supervision for safety  Transfers Overall transfer level: Needs assistance Equipment used: None Transfers: Sit to/from Stand Sit to Stand: Min guard         General transfer comment: Min guard for safety  Ambulation/Gait Ambulation/Gait assistance: Min guard Ambulation Distance (Feet): 150 Feet Assistive device: Rolling walker (2 wheeled) Gait Pattern/deviations: Step-through pattern;Ataxic Gait velocity: decreased Gait velocity interpretation: Below normal speed for age/gender General Gait Details: pt with slow frequency and  decreased cadence this session.   Stairs            Wheelchair Mobility    Modified Rankin (Stroke Patients Only)       Balance Overall balance assessment: Needs assistance Sitting-balance support: No upper extremity supported Sitting balance-Leahy Scale: Fair     Standing balance support: No upper extremity supported;During functional activity Standing balance-Leahy Scale: Fair                               Pertinent Vitals/Pain Pain Assessment: No/denies pain    Home Living Family/patient expects to be discharged to:: Inpatient rehab                 Additional Comments: plans to go to drug addiction rehab center, has sister assisting PRN    Prior Function Level of Independence: Independent         Comments: completely independnet     Hand Dominance   Dominant Hand: Right    Extremity/Trunk Assessment   Upper Extremity Assessment Upper Extremity Assessment: Overall WFL for tasks assessed    Lower Extremity Assessment Lower Extremity Assessment: Overall WFL for tasks assessed       Communication   Communication: No difficulties  Cognition Arousal/Alertness: Awake/alert Behavior During Therapy: WFL for tasks assessed/performed Overall Cognitive Status: Within Functional Limits for tasks assessed                      General Comments      Exercises     Assessment/Plan    PT Assessment Patient needs continued PT services  PT Problem List Decreased mobility;Decreased  balance;Decreased activity tolerance          PT Treatment Interventions DME instruction;Gait training;Stair training;Functional mobility training;Therapeutic activities;Therapeutic exercise;Balance training    PT Goals (Current goals can be found in the Care Plan section)  Acute Rehab PT Goals Patient Stated Goal: to go to inpatient rehab PT Goal Formulation: With patient Time For Goal Achievement: 06/14/16 Potential to Achieve Goals: Good     Frequency Min 3X/week   Barriers to discharge        Co-evaluation               End of Session Equipment Utilized During Treatment: Gait belt Activity Tolerance: Patient tolerated treatment well Patient left: in chair;with call bell/phone within reach Nurse Communication: Mobility status         Time: 1440-1503 PT Time Calculation (min) (ACUTE ONLY): 23 min   Charges:   PT Evaluation $PT Eval Moderate Complexity: 1 Procedure PT Treatments $Gait Training: 8-22 mins   PT G Codes:        Scheryl Marten PT, DPT  [phone]  06/07/2016, 3:46 PM

## 2016-06-08 LAB — COMPREHENSIVE METABOLIC PANEL
ALBUMIN: 1.7 g/dL — AB (ref 3.5–5.0)
ALT: 40 U/L (ref 17–63)
AST: 79 U/L — AB (ref 15–41)
Alkaline Phosphatase: 188 U/L — ABNORMAL HIGH (ref 38–126)
Anion gap: 8 (ref 5–15)
BILIRUBIN TOTAL: 1.6 mg/dL — AB (ref 0.3–1.2)
BUN: 9 mg/dL (ref 6–20)
CO2: 17 mmol/L — ABNORMAL LOW (ref 22–32)
CREATININE: 0.89 mg/dL (ref 0.61–1.24)
Calcium: 7.5 mg/dL — ABNORMAL LOW (ref 8.9–10.3)
Chloride: 108 mmol/L (ref 101–111)
GFR calc Af Amer: >60 mL/min (ref >60)
GFR calc non Af Amer: >60 mL/min (ref >60)
GLUCOSE: 108 mg/dL — AB (ref 65–99)
POTASSIUM: 3.4 mmol/L — AB (ref 3.5–5.1)
Sodium: 133 mmol/L — ABNORMAL LOW (ref 135–145)
TOTAL PROTEIN: 5.7 g/dL — AB (ref 6.5–8.1)

## 2016-06-08 LAB — AMMONIA: Ammonia: 78 umol/L — ABNORMAL HIGH (ref 9–35)

## 2016-06-08 MED ORDER — POTASSIUM CHLORIDE CRYS ER 20 MEQ PO TBCR
40.0000 meq | EXTENDED_RELEASE_TABLET | Freq: Once | ORAL | Status: AC
  Administered 2016-06-08: 40 meq via ORAL
  Filled 2016-06-08: qty 2

## 2016-06-08 MED ORDER — SPIRONOLACTONE 50 MG PO TABS
150.0000 mg | ORAL_TABLET | Freq: Every day | ORAL | Status: DC
  Administered 2016-06-09 – 2016-06-10 (×2): 150 mg via ORAL
  Filled 2016-06-08 (×2): qty 1

## 2016-06-08 MED ORDER — LACTULOSE 10 GM/15ML PO SOLN
30.0000 g | Freq: Three times a day (TID) | ORAL | Status: DC
Start: 2016-06-08 — End: 2016-06-10
  Administered 2016-06-08 – 2016-06-10 (×6): 30 g via ORAL
  Filled 2016-06-08 (×7): qty 45

## 2016-06-08 NOTE — Progress Notes (Signed)
PROGRESS NOTE        PATIENT DETAILS Name: Jose [name] Age: 61 y.o. Sex: male Date of Birth: [birth year] Admit Date: 06/06/2016 Admitting Physician Edwin Dada, MD [id number] H, NP  Brief Narrative: Patient is a 61 y.o. male with history of hemachromatosis, appetite is C, ongoing alcohol use, cirrhosis, admitted for altered mental status, thought to have acute encephalopathy. See below for further details  Subjective: Awake and alert. Claims to have had multiple bowel movements this morning.  Assessment/Plan: Hepatic encephalopathy: Encephalopathy has resolved, he is much more awake and alert. Continue lactulose and rifaximin. Suspect noncompliance/ongoing alcohol use as the etiology for encephalopathy-he does acknowledge a running out of his medications a few days back and missed a few doses before he got it refilled.    Liver cirrhosis: Probably due to hep C and alcoholism. He apparently also has a history of hemachromatosis. Continue Aldactone and Lasix. He does have ascites-but abdomen is not tense-do not see any indication for paracentesis at this time.  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 Appears at baseline. Follow periodically.  History of hemachromatosis: On therapeutic phlebotomy as an outpatient-resume on discharge.  Alcohol abuse: Reluctantly acknowledges still consuming alcohol-initially he denied any alcohol consumption. No signs of withdrawal at this time, continue Ativan per protocol.  Physical deconditioning/generalized weakness:  PT evaluation-may need SNF on discharge.  DVT Prophylaxis: SCD's  Code Status: Full code   Family Communication: None at bedside  Disposition Plan: Remain inpatient-but will plan on Home health vs SNF on discharge- 2/12  Antimicrobial agents: Anti-infectives    Start     Dose/Rate Route Frequency Ordered  Stop   06/06/16 2200  rifaximin (XIFAXAN) tablet 550 mg     550 mg Oral 2 times daily 06/06/16 1549        Procedures: None  CONSULTS:  None  Time spent: 25 minutes-Greater than 50% of this time was spent in counseling, explanation of diagnosis, planning of further management, and coordination of care.  MEDICATIONS: Scheduled Meds: . folic acid  1 mg Oral Daily  . furosemide  40 mg Oral Daily  . lactulose  45 g Oral TID  . multivitamin with minerals  1 tablet Oral Daily  . rifaximin  550 mg Oral BID  . spironolactone  100 mg Oral Daily  . thiamine  100 mg Oral Daily   Continuous Infusions: PRN Meds:.LORazepam **OR** LORazepam, ondansetron **OR** ondansetron (ZOFRAN) IV   PHYSICAL EXAM: Vital signs: Vitals:   06/07/16 0425 06/07/16 1300 06/07/16 2053 06/08/16 0549  BP: 119/68 128/73 133/69 127/72  Pulse: (!) 59 63 66 61  Resp: 19 18 19 19   Temp: 98.1 F (36.7 C) 98.8 F (37.1 C) 98.3 F (36.8 C) 97.1 F (36.2 C)  TempSrc: Oral Oral Oral Oral  SpO2: 100% 100% 100% 100%  Weight:      Height:       Filed Weights   06/06/16 1133  Weight: 79.4 kg (175 lb)   Body mass index is 27.41 kg/m.   General appearance :Awake, alert, not in any distress. Speech Clear. Chronically sick appearing Eyes:, pupils equally reactive to light HEENT: Atraumatic and Normocephalic Neck: supple, no JVD. No cervical lymphadenopathy.  Resp:Good air entry bilaterally, no added sounds  CVS: S1 S2 regular, no murmurs.  GI: Bowel sounds  present, slightly distended with dullness in the flanks. Abdomen is nontender. Extremities: B/L Lower Ext shows trace edema edema, both legs are warm to touch Neurology:  Nonfocal exam-but has generalized weakness Musculoskeletal:No digital cyanosis Skin:No Rash, warm and dry Wounds:N/A  I have personally reviewed following labs and imaging studies  LABORATORY DATA: CBC:  Recent Labs Lab 06/06/16 1140 06/07/16 0437  WBC 3.2* 3.2*  NEUTROABS 1.7   --   HGB 13.3 12.6*  HCT 38.1* 36.6*  MCV 100.3* 100.3*  PLT 53* 61*    Basic Metabolic Panel:  Recent Labs Lab 06/06/16 1140 06/07/16 0437 06/08/16 0322  NA 141 138 133*  K 3.5 3.6 3.4*  CL 111 108 108  CO2 23 20* 17*  GLUCOSE 113* 150* 108*  BUN 12 11 9   CREATININE 1.02 1.04 0.89  CALCIUM 8.1* 7.7* 7.5*    GFR: Estimated Creatinine Clearance: 89.1 mL/min (by C-G formula based on SCr of 0.89 mg/dL).  Liver Function Tests:  Recent Labs Lab 06/06/16 1140 06/07/16 0437 06/08/16 0322  AST 93* 88* 79*  ALT 44 43 40  ALKPHOS 266* 216* 188*  BILITOT 1.6* 1.5* 1.6*  PROT 6.6 6.2* 5.7*  ALBUMIN 2.1* 1.9* 1.7*   No results for input(s): LIPASE, AMYLASE in the last 168 hours.  Recent Labs Lab 06/06/16 1140 06/08/16 0322  AMMONIA 187* 78*    Coagulation Profile: No results for input(s): INR, PROTIME in the last 168 hours.  Cardiac Enzymes: No results for input(s): CKTOTAL, CKMB, CKMBINDEX, TROPONINI in the last 168 hours.  BNP (last 3 results) No results for input(s): PROBNP in the last 8760 hours.  HbA1C: No results for input(s): HGBA1C in the last 72 hours.  CBG:  Recent Labs Lab 06/06/16 1147  GLUCAP 99    Lipid Profile: No results for input(s): CHOL, HDL, LDLCALC, TRIG, CHOLHDL, LDLDIRECT in the last 72 hours.  Thyroid Function Tests: No results for input(s): TSH, T4TOTAL, FREET4, T3FREE, THYROIDAB in the last 72 hours.  Anemia Panel: No results for input(s): VITAMINB12, FOLATE, FERRITIN, TIBC, IRON, RETICCTPCT in the last 72 hours.  Urine analysis:    Component Value Date/Time   COLORURINE YELLOW 06/06/2016 1140   APPEARANCEUR CLEAR 06/06/2016 1140   LABSPEC 1.011 06/06/2016 1140   PHURINE 7.0 06/06/2016 1140   GLUCOSEU NEGATIVE 06/06/2016 1140   HGBUR NEGATIVE 06/06/2016 1140   BILIRUBINUR NEGATIVE 06/06/2016 1140   KETONESUR NEGATIVE 06/06/2016 1140   PROTEINUR NEGATIVE 06/06/2016 1140   NITRITE NEGATIVE 06/06/2016 1140    LEUKOCYTESUR NEGATIVE 06/06/2016 1140    Sepsis Labs: Lactic Acid, Venous No results found for: LATICACIDVEN  MICROBIOLOGY: No results found for this or any previous visit (from the past 240 hour(s)).  RADIOLOGY STUDIES/RESULTS: Ct Head Wo Contrast  Result Date: 06/06/2016 CLINICAL DATA:  Altered mental status. EXAM: CT HEAD WITHOUT CONTRAST TECHNIQUE: Contiguous axial images were obtained from the base of the skull through the vertex without intravenous contrast. COMPARISON:  None. FINDINGS: Brain: Mild chronic microvascular disease throughout the deep white matter. No acute intracranial abnormality. Specifically, no hemorrhage, hydrocephalus, mass lesion, acute infarction, or significant intracranial injury. Vascular: No hyperdense vessel or unexpected calcification. Skull: No acute calvarial abnormality. Sinuses/Orbits: Old left medial orbital wall blowout fracture. Paranasal sinuses and mastoids are clear. Other: None IMPRESSION: Mild chronic small vessel disease throughout the deep white matter. No acute intracranial abnormality. Electronically Signed   By: Rolm Baptise M.D.   On: 06/06/2016 13:09     LOS: 2 days  Oren Binet, MD  [facility] Pager:[phone]  If 7PM-7AM, please contact night-coverage [email or web address] Password [facility] 06/08/2016, 12:11 PM

## 2016-06-09 ENCOUNTER — Other Ambulatory Visit: Payer: Medicaid Other

## 2016-06-09 ENCOUNTER — Telehealth: Payer: Self-pay | Admitting: Hematology and Oncology

## 2016-06-09 LAB — BASIC METABOLIC PANEL
ANION GAP: 4 — AB (ref 5–15)
BUN: 10 mg/dL (ref 6–20)
CHLORIDE: 106 mmol/L (ref 101–111)
CO2: 20 mmol/L — ABNORMAL LOW (ref 22–32)
Calcium: 7.3 mg/dL — ABNORMAL LOW (ref 8.9–10.3)
Creatinine, Ser: 0.79 mg/dL (ref 0.61–1.24)
GFR calc Af Amer: >60 mL/min (ref >60)
Glucose, Bld: 127 mg/dL — ABNORMAL HIGH (ref 65–99)
POTASSIUM: 3.3 mmol/L — AB (ref 3.5–5.1)
Sodium: 130 mmol/L — ABNORMAL LOW (ref 135–145)

## 2016-06-09 MED ORDER — LACTULOSE 10 GM/15ML PO SOLN: 30.0000 g | Freq: Three times a day (TID) | ORAL | 0 refills | Status: DC

## 2016-06-09 MED ORDER — SPIRONOLACTONE 100 MG PO TABS: 200.0000 mg | ORAL_TABLET | Freq: Every day | ORAL | 0 refills | Status: DC

## 2016-06-09 MED ORDER — FOLIC ACID 1 MG PO TABS: 1.0000 mg | ORAL_TABLET | Freq: Every day | ORAL | 0 refills | Status: DC

## 2016-06-09 MED ORDER — RIFAXIMIN 550 MG PO TABS: 550.0000 mg | ORAL_TABLET | Freq: Two times a day (BID) | ORAL | 0 refills | Status: DC

## 2016-06-09 MED ORDER — POTASSIUM CHLORIDE CRYS ER 20 MEQ PO TBCR
40.0000 meq | EXTENDED_RELEASE_TABLET | Freq: Every day | ORAL | Status: DC
  Administered 2016-06-09 – 2016-06-10 (×2): 40 meq via ORAL
  Filled 2016-06-09 (×3): qty 2

## 2016-06-09 MED ORDER — OMEPRAZOLE 20 MG PO CPDR: 20.0000 mg | DELAYED_RELEASE_CAPSULE | Freq: Two times a day (BID) | ORAL | 0 refills | Status: DC

## 2016-06-09 MED ORDER — FUROSEMIDE 40 MG PO TABS: 40.0000 mg | ORAL_TABLET | Freq: Every day | ORAL | 0 refills | Status: DC

## 2016-06-09 MED ORDER — POTASSIUM CHLORIDE CRYS ER 20 MEQ PO TBCR: 20.0000 meq | EXTENDED_RELEASE_TABLET | Freq: Every day | ORAL | 0 refills | Status: DC

## 2016-06-09 NOTE — Progress Notes (Signed)
Physical Therapy Treatment Patient Details Name: Jose [name] MRN: [id number] DOB: 03-Dec-1955 Today's Date: 06/09/2016    History of Present Illness Pt is 61 yo male admitted on 06/06/16 through ED via EMS from home.  PMH significant for alcohol cirrhosis, chronic hep c, CKD III, thrombocytopenia.     PT Comments    Pt admitted with above diagnosis. Pt currently with functional limitations due to balance and endurance deficits. Pt was able to ambulate but did best with RW.  Unsteady without RW.  Needs continued practice as pt with decr safety awareness with pt feeling he does not need RW.  Pt will benefit from skilled PT to increase their independence and safety with mobility to allow discharge to the venue listed below.    Follow Up Recommendations  Other (comment);Supervision for mobility/OOB;SNF (Pt reports going to inpatient rehab for alcohol dependency )     Equipment Recommendations  Rolling walker with 5" wheels;3in1 (PT)    Recommendations for Other Services       Precautions / Restrictions Precautions Precautions: Fall Precaution Comments: h/o alcoholism Restrictions Weight Bearing Restrictions: No    Mobility  Bed Mobility Overal bed mobility: Needs Assistance Bed Mobility: Supine to Sit     Supine to sit: Independent        Transfers Overall transfer level: Needs assistance Equipment used: None;Rolling walker (2 wheeled) Transfers: Sit to/from Stand Sit to Stand: Min guard         General transfer comment: Min guard for safety  Ambulation/Gait Ambulation/Gait assistance: Min guard;Min assist Ambulation Distance (Feet): 200 Feet Assistive device: Rolling walker (2 wheeled);None Gait Pattern/deviations: Step-through pattern;Ataxic Gait velocity: decreased Gait velocity interpretation: Below normal speed for age/gender General Gait Details: pt with slow frequency and decreased cadence this session. Pt ambulated with and without RW.  Without RW, pt with  incr ataxia and unsteady gait.  Pt did much better overall when using rW except needed cues in closer quarters to keep rW close to him.     Stairs            Wheelchair Mobility    Modified Rankin (Stroke Patients Only)       Balance Overall balance assessment: Needs assistance Sitting-balance support: No upper extremity supported Sitting balance-Leahy Scale: Fair     Standing balance support: During functional activity;Bilateral upper extremity supported Standing balance-Leahy Scale: Poor Standing balance comment: relies on RW for stability.             High level balance activites: Direction changes;Turns;Sudden stops High Level Balance Comments: min guard assist with challenges to balance.     Cognition Arousal/Alertness: Awake/alert Behavior During Therapy: WFL for tasks assessed/performed Overall Cognitive Status: Within Functional Limits for tasks assessed                      Exercises General Exercises - Lower Extremity Ankle Circles/Pumps: AROM;Both;10 reps;Supine Long Arc Quad: AROM;Both;10 reps;Seated    General Comments        Pertinent Vitals/Pain Pain Assessment: No/denies pain  VSS  Home Living                      Prior Function            PT Goals (current goals can now be found in the care plan section) Progress towards PT goals: Progressing toward goals    Frequency    Min 3X/week      PT Plan Current plan remains  appropriate    Co-evaluation             End of Session Equipment Utilized During Treatment: Gait belt Activity Tolerance: Patient tolerated treatment well Patient left: in chair;with call bell/phone within reach;with chair alarm set     Time: 1349-1359 PT Time Calculation (min) (ACUTE ONLY): 10 min  Charges:  $Gait Training: 8-22 mins                    G Codes:      Denice Paradise 2016-06-12, 3:52 PM [facility] Acute Rehabilitation [phone] (pager)

## 2016-06-09 NOTE — Discharge Summary (Addendum)
PATIENT DETAILS Name: Jose [name] Age: 61 y.o. Sex: male Date of Birth: 1955/11/21 MRN: [id number]. Admitting Physician: Edwin Dada, MD [id number] H, NP  Admit Date: 06/06/2016 Discharge date: 06/10/2016  Recommendations for Outpatient Follow-up:  1. Follow up with PCP in 1-2 weeks 2. Please obtain BMP/CBC in one week 3. Will need ongoing counseling regarding importance of avoiding alcohol and cocaine. 4. Needs ongoing counseling regarding importance of compliance to medication 5. Please ensure follow-up at the cancer center for periodic phlebotomy-has underlying history of hemachromatosis.  Admitted From:  Home  Disposition: SNF   Home Health: No  Equipment/Devices: None  Discharge Condition: Stable  CODE STATUS: FULL CODE  Diet recommendation:  Heart Healthy  Brief Summary: See H&P, Labs, Consult and Test reports for all details in brief, Patient is a 61 y.o. male with history of hemachromatosis, appetite is C, ongoing alcohol use, cirrhosis, admitted for altered mental status, thought to have acute encephalopathy. See below for further details  Brief Hospital Course: Hepatic encephalopathy: Encephalopathy has resolved, he is awake and alert. Continue lactulose and rifaximin. Suspect noncompliance/ongoing alcohol use as the etiology for encephalopathy-he does acknowledge a running out of his medications a few days back and missed a few doses before he got it refilled.    Liver cirrhosis: Probably due to hep C and alcoholism. He apparently also has a history of hemachromatosis. Continue Aldactone and Lasix. He does have ascites-but abdomen is not tense-do not see any indication for paracentesis at this time.  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 Appears at baseline. Follow periodically.  History of hemachromatosis:  On therapeutic phlebotomy as an outpatient-resume on discharge.  Alcohol abuse: Reluctantly acknowledges still consuming alcohol-initially he denied any alcohol consumption. No signs of withdrawal at this time, managed with Ativan per protocol.  Physical deconditioning/generalized weakness:  PT evaluation-may need SNF on discharge.  Procedures/Studies: None  Discharge Diagnoses:  Principal Problem:   Hepatic encephalopathy ([place]) Active Problems:   Alcoholic cirrhosis of liver with ascites (HCC)   Chronic hepatitis C without hepatic coma (HCC)   CKD, stage III   Thrombocytopenia (HCC)   Discharge Instructions:  Activity:  As tolerated with Full fall precautions use walker/cane & assistance as needed   Discharge Instructions    Call MD for:  difficulty breathing, headache or visual disturbances    Complete by:  As directed    Call MD for:  persistant nausea and vomiting    Complete by:  As directed    Diet - low sodium heart healthy    Complete by:  As directed    Discharge instructions    Complete by:  As directed    Follow with Primary MD  PLACEY,MARY H, NP  in 1 week  Please avoid cocaine and alcohol  Please get a complete blood count and chemistry panel checked by your Primary MD at your next visit, and again as instructed by your Primary MD.  Get Medicines reviewed and adjusted: Please take all your medications with you for your next visit with your Primary MD  Laboratory/radiological data: Please request your Primary MD to go over all hospital tests and procedure/radiological results at the follow up, please ask your Primary MD to get all Hospital records sent to his/her office.  In some cases, they will be blood work, cultures and biopsy results pending at the time of your discharge. Please request that  your primary care M.D. follows up on these results.  Also Note the following: If you experience worsening of your admission symptoms, develop shortness of  breath, life threatening emergency, suicidal or homicidal thoughts you must seek medical attention immediately by calling 911 or calling your MD immediately  if symptoms less severe.  You must read complete instructions/literature along with all the possible adverse reactions/side effects for all the Medicines you take and that have been prescribed to you. Take any new Medicines after you have completely understood and accpet all the possible adverse reactions/side effects.   Do not drive when taking Pain medications or sleeping medications (Benzodaizepines)  Do not take more than prescribed Pain, Sleep and Anxiety Medications. It is not advisable to combine anxiety,sleep and pain medications without talking with your primary care practitioner  Special Instructions: If you have smoked or chewed Tobacco  in the last 2 yrs please stop smoking, stop any regular Alcohol  and or any Recreational drug use.  Wear Seat belts while driving.  Please note: You were cared for by a hospitalist during your hospital stay. Once you are discharged, your primary care physician will handle any further medical issues. Please note that NO REFILLS for any discharge medications will be authorized once you are discharged, as it is imperative that you return to your primary care physician (or establish a relationship with a primary care physician if you do not have one) for your post hospital discharge needs so that they can reassess your need for medications and monitor your lab values.   Increase activity slowly    Complete by:  As directed      Allergies as of 06/10/2016   Not on File     Medication List    STOP taking these medications   triamterene-hydrochlorothiazide 37.5-25 MG tablet Commonly known as:  MAXZIDE-25     TAKE these medications   folic acid 1 MG tablet Commonly known as:  FOLVITE Take 1 tablet (1 mg total) by mouth daily.   furosemide 40 MG tablet Commonly known as:  LASIX Take 1 tablet  (40 mg total) by mouth daily. What changed:  when to take this   lactulose 10 GM/15ML solution Commonly known as:  CHRONULAC Take 45 mLs (30 g total) by mouth 3 (three) times daily.   omeprazole 20 MG capsule Commonly known as:  PRILOSEC Take 1 capsule (20 mg total) by mouth 2 (two) times daily before a meal.   potassium chloride SA 20 MEQ tablet Commonly known as:  K-DUR,KLOR-CON Take 1 tablet (20 mEq total) by mouth daily. What changed:  when to take this   rifaximin 550 MG Tabs tablet Commonly known as:  XIFAXAN Take 1 tablet (550 mg total) by mouth 2 (two) times daily.   spironolactone 100 MG tablet Commonly known as:  ALDACTONE Take 2 tablets (200 mg total) by mouth daily. What changed:  how much to take            Durable Medical Equipment        Start     Ordered   06/09/16 1031  For home use only DME Gilford Rile  [facility])  Once    Question:  Patient needs a walker to treat with the following condition  Answer:  Liver cirrhosis ([place])   06/09/16 1031     Follow-up Information    PLACEY,MARY H, NP. Schedule an appointment as soon as possible for a visit in 1 week(s).   Contact information: [address] [place] [address] [phone]          Not on File  Consultations:   None  Other Procedures/Studies: Ct Head Wo Contrast  Result Date: 06/06/2016 CLINICAL DATA:  Altered mental status. EXAM: CT HEAD WITHOUT CONTRAST TECHNIQUE: Contiguous axial images were obtained from the base of the skull through the vertex without intravenous contrast. COMPARISON:  None. FINDINGS: Brain: Mild chronic microvascular disease throughout the deep white matter. No acute intracranial abnormality. Specifically, no hemorrhage, hydrocephalus, mass lesion, acute infarction, or significant intracranial injury. Vascular: No hyperdense vessel or unexpected calcification. Skull: No acute calvarial abnormality. Sinuses/Orbits: Old left medial orbital wall blowout fracture.  Paranasal sinuses and mastoids are clear. Other: None IMPRESSION: Mild chronic small vessel disease throughout the deep white matter. No acute intracranial abnormality. Electronically Signed   By: Rolm Baptise M.D.   On: 06/06/2016 13:09     TODAY-DAY OF DISCHARGE:  Subjective:   Jose [name] today has no headache,no chest abdominal pain,no new weakness tingling or numbness, feels much better wants to go home today.   Objective:   Blood pressure 112/73, pulse 60, temperature 98.8 F (37.1 C), temperature source Oral, resp. rate 19, height 5\' 7"  (1.702 m), weight 79.4 kg (175 lb), SpO2 100 %.  Intake/Output Summary (Last 24 hours) at 06/10/16 1058 Last data filed at 06/10/16 0900  Gross per 24 hour  Intake              904 ml  Output                0 ml  Net              904 ml   Filed Weights   06/06/16 1133  Weight: 79.4 kg (175 lb)    Exam: Awake Alert, Oriented *3, No new F.N deficits, Normal affect [place].AT,PERRAL Supple Neck,No JVD, No cervical lymphadenopathy appriciated.  Symmetrical Chest wall movement, Good air movement bilaterally, CTAB RRR,No Gallops,Rubs or new Murmurs, No Parasternal Heave +ve B.Sounds, Abd Soft, Non tender, No organomegaly appriciated, No rebound -guarding or rigidity. No Cyanosis, Clubbing or edema, No new Rash or bruise   PERTINENT RADIOLOGIC STUDIES: Ct Head Wo Contrast  Result Date: 06/06/2016 CLINICAL DATA:  Altered mental status. EXAM: CT HEAD WITHOUT CONTRAST TECHNIQUE: Contiguous axial images were obtained from the base of the skull through the vertex without intravenous contrast. COMPARISON:  None. FINDINGS: Brain: Mild chronic microvascular disease throughout the deep white matter. No acute intracranial abnormality. Specifically, no hemorrhage, hydrocephalus, mass lesion, acute infarction, or significant intracranial injury. Vascular: No hyperdense vessel or unexpected calcification. Skull: No acute calvarial abnormality. Sinuses/Orbits: Old  left medial orbital wall blowout fracture. Paranasal sinuses and mastoids are clear. Other: None IMPRESSION: Mild chronic small vessel disease throughout the deep white matter. No acute intracranial abnormality. Electronically Signed   By: Rolm Baptise M.D.   On: 06/06/2016 13:09     PERTINENT LAB RESULTS: CBC: No results for input(s): WBC, HGB, HCT, PLT in the last 72 hours. CMET CMP     Component Value Date/Time   NA 130 (L) 06/09/2016 0358   K 3.3 (L) 06/09/2016 0358   CL 106 06/09/2016 0358   CO2 20 (L) 06/09/2016 0358   GLUCOSE 127 (H) 06/09/2016 0358   BUN 10 06/09/2016 0358   CREATININE 0.79 06/09/2016 0358   CALCIUM 7.3 (L) 06/09/2016 0358   PROT 5.7 (L) 06/08/2016 0322   ALBUMIN 1.7 (L) 06/08/2016 0322   AST 79 (  H) 06/08/2016 0322   ALT 40 06/08/2016 0322   ALKPHOS 188 (H) 06/08/2016 0322   BILITOT 1.6 (H) 06/08/2016 0322   GFRNONAA >60 06/09/2016 0358   GFRAA >60 06/09/2016 0358    GFR Estimated Creatinine Clearance: 99.2 mL/min (by C-G formula based on SCr of 0.79 mg/dL). No results for input(s): LIPASE, AMYLASE in the last 72 hours. No results for input(s): CKTOTAL, CKMB, CKMBINDEX, TROPONINI in the last 72 hours. Invalid input(s): POCBNP No results for input(s): DDIMER in the last 72 hours. No results for input(s): HGBA1C in the last 72 hours. No results for input(s): CHOL, HDL, LDLCALC, TRIG, CHOLHDL, LDLDIRECT in the last 72 hours. No results for input(s): TSH, T4TOTAL, T3FREE, THYROIDAB in the last 72 hours.  Invalid input(s): FREET3 No results for input(s): VITAMINB12, FOLATE, FERRITIN, TIBC, IRON, RETICCTPCT in the last 72 hours. Coags: No results for input(s): INR in the last 72 hours.  Invalid input(s): PT Microbiology: No results found for this or any previous visit (from the past 240 hour(s)).  FURTHER DISCHARGE INSTRUCTIONS:  Get Medicines reviewed and adjusted: Please take all your medications with you for your next visit with your Primary  MD  Laboratory/radiological data: Please request your Primary MD to go over all hospital tests and procedure/radiological results at the follow up, please ask your Primary MD to get all Hospital records sent to his/her office.  In some cases, they will be blood work, cultures and biopsy results pending at the time of your discharge. Please request that your primary care M.D. goes through all the records of your hospital data and follows up on these results.  Also Note the following: If you experience worsening of your admission symptoms, develop shortness of breath, life threatening emergency, suicidal or homicidal thoughts you must seek medical attention immediately by calling 911 or calling your MD immediately  if symptoms less severe.  You must read complete instructions/literature along with all the possible adverse reactions/side effects for all the Medicines you take and that have been prescribed to you. Take any new Medicines after you have completely understood and accpet all the possible adverse reactions/side effects.   Do not drive when taking Pain medications or sleeping medications (Benzodaizepines)  Do not take more than prescribed Pain, Sleep and Anxiety Medications. It is not advisable to combine anxiety,sleep and pain medications without talking with your primary care practitioner  Special Instructions: If you have smoked or chewed Tobacco  in the last 2 yrs please stop smoking, stop any regular Alcohol  and or any Recreational drug use.  Wear Seat belts while driving.  Please note: You were cared for by a hospitalist during your hospital stay. Once you are discharged, your primary care physician will handle any further medical issues. Please note that NO REFILLS for any discharge medications will be authorized once you are discharged, as it is imperative that you return to your primary care physician (or establish a relationship with a primary care physician if you do not have  one) for your post hospital discharge needs so that they can reassess your need for medications and monitor your lab values.  Total Time spent coordinating discharge including counseling, education and face to face time equals 45 minutes.  SignedOren Binet 06/10/2016 10:58 AM

## 2016-06-09 NOTE — Telephone Encounter (Signed)
Received call from pt to r/s appts to later this week due to currently being admitted into [facility]. Gave pt new appt date/time

## 2016-06-09 NOTE — NC FL2 (Signed)
[place] MEDICAID FL2 LEVEL OF CARE SCREENING TOOL     IDENTIFICATION  Patient Name: Jose [name] Birthdate: 1955/09/04 Sex: male Admission Date (Current Location): 06/06/2016  [place] and [place] Number:  Kathleen Argue [id number] [place] and Address:  [facility] [place], [place] [address], [place], [place] [address]      Provider Number: [id number]  Attending Physician Name and Address:  Jonetta Osgood, MD  Relative Name and Phone Number:       Current Level of Care: Hospital Recommended Level of Care: [place] Prior Approval Number:    Date Approved/Denied:   PASRR Number:  [id number]   Discharge Plan: SNF    Current Diagnoses: Patient Active Problem List   Diagnosis Date Noted  . Hepatic encephalopathy ([place]) 06/06/2016  . Alcoholic cirrhosis of liver with ascites ([place]) 06/06/2016  . Chronic hepatitis C without hepatic coma ([place]) 06/06/2016  . CKD, stage III 06/06/2016  . Thrombocytopenia ([place]) 06/06/2016    Orientation RESPIRATION BLADDER Height & Weight     Self, Time, Situation, Place  Normal Continent Weight: 175 lb (79.4 kg) Height:  5\' 7"  (170.2 cm)  BEHAVIORAL SYMPTOMS/MOOD NEUROLOGICAL BOWEL NUTRITION STATUS      Continent Diet (Regular; thin fluids)  AMBULATORY STATUS COMMUNICATION OF NEEDS Skin   Limited Assist Verbally Normal                       Personal Care Assistance Level of Assistance  Bathing, Feeding, Dressing Bathing Assistance: Limited assistance Feeding assistance: Limited assistance Dressing Assistance: Limited assistance     Functional Limitations Info  Sight, Speech, Hearing Sight Info: Adequate Hearing Info: Adequate Speech Info: Adequate    SPECIAL CARE FACTORS FREQUENCY  PT (By licensed PT)     PT Frequency: 5              Contractures Contractures Info: Not present    Additional Factors Info  Code Status, Allergies Code Status Info: Full Allergies Info: Not on file           Current Medications (06/09/2016):  This is the current hospital active medication list Current Facility-Administered Medications  Medication Dose Route Frequency Provider Last Rate Last Dose  . folic acid (FOLVITE) tablet 1 mg  1 mg Oral Daily Edwin Dada, MD   1 mg at 06/09/16 1120  . furosemide (LASIX) tablet 40 mg  40 mg Oral Daily Jonetta Osgood, MD   40 mg at 06/09/16 1121  . lactulose (CHRONULAC) 10 GM/15ML solution 30 g  30 g Oral TID Jonetta Osgood, MD   30 g at 06/09/16 1120  . LORazepam (ATIVAN) tablet 1 mg  1 mg Oral Q6H PRN Edwin Dada, MD       Or  . LORazepam (ATIVAN) injection 1 mg  1 mg Intravenous Q6H PRN Edwin Dada, MD      . multivitamin with minerals tablet 1 tablet  1 tablet Oral Daily Edwin Dada, MD   1 tablet at 06/09/16 1121  . ondansetron (ZOFRAN) tablet 4 mg  4 mg Oral Q6H PRN Edwin Dada, MD       Or  . ondansetron (ZOFRAN) injection 4 mg  4 mg Intravenous Q6H PRN Edwin Dada, MD      . potassium chloride SA (K-DUR,KLOR-CON) CR tablet 40 mEq  40 mEq Oral Daily Jonetta Osgood, MD   40 mEq at 06/09/16 0548  . rifaximin (XIFAXAN) tablet  550 mg  550 mg Oral BID Edwin Dada, MD   550 mg at 06/09/16 1121  . spironolactone (ALDACTONE) tablet 150 mg  150 mg Oral Daily Jonetta Osgood, MD   150 mg at 06/09/16 1121  . thiamine (VITAMIN B-1) tablet 100 mg  100 mg Oral Daily Edwin Dada, MD   100 mg at 06/09/16 1121     Discharge Medications: Please see discharge summary for a list of discharge medications.  Relevant Imaging Results:  Relevant Lab Results:   Additional Information SSN: [id number]  Truitt Merle, LCSW
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
## 2016-06-10 NOTE — Progress Notes (Signed)
Seen and examined Lying comfortably-no new complaints Had multiple bowel movements yesterday  On exam Vital signs remain stable He is awake and alert Chest-clear bilaterally Abdomen-soft, nontender and slightly distended with illness in the flanks. However abdomen does not appear tense Nonfocal exam  Impression: Hepatic encephalopathy-resolved Liver cirrhosis-stable  Plan: Awaiting SNF bed Stable for discharge when bed available See discharge summary for details

## 2016-06-10 NOTE — Clinical Social Work Note (Signed)
CSW was unable to secure bed with [facility]. Pt stated he would stay with family. CSW provided resources for inpatient rehab for substance use and shelters. CSW also provided pt with a bus pass. Pt was agreeable and pleasant. CSW informed RN pt is ready for d/c home. CSW signing off as no further needs identified.   Oretha Ellis, Latanya Presser, [place] Social Worker  [phone]

## 2016-06-10 NOTE — Discharge Planning (Signed)
Patient discharged home in stable condition. Verbalizes understanding of all discharge instructions, including home medications and follow up appointments. 

## 2016-06-11 ENCOUNTER — Encounter (HOSPITAL_BASED_OUTPATIENT_CLINIC_OR_DEPARTMENT_OTHER): Payer: Self-pay | Admitting: Emergency Medicine

## 2016-06-11 NOTE — Care Management (Signed)
Called patient's phone , left voice mail . Patient discharged last evening has orders for HHPT,RN,aide, and SW. Patient has Medicaid but does not have Dx for Medicaid to cover HHPT. Awaiting call back to arrange home health RN, PT and aide.   Magdalen Spatz RN BSN [phone]

## 2016-06-12 MED FILL — XIFAXAN 550 MG TABLET: 550 | 30 days supply | Qty: 60 | Fill #0

## 2016-06-12 MED FILL — FOLIC ACID 1 MG TABLET: 1 | 30 days supply | Qty: 30 | Fill #0

## 2016-06-13 ENCOUNTER — Ambulatory Visit (HOSPITAL_BASED_OUTPATIENT_CLINIC_OR_DEPARTMENT_OTHER): Payer: Medicaid Other

## 2016-06-13 ENCOUNTER — Other Ambulatory Visit (HOSPITAL_BASED_OUTPATIENT_CLINIC_OR_DEPARTMENT_OTHER): Payer: Medicaid Other

## 2016-06-13 LAB — CBC & DIFF AND RETIC
BASO%: 0.6 % (ref 0.0–2.0)
Basophils Absolute: 0 10*3/uL (ref 0.0–0.1)
EOS ABS: 0.1 10*3/uL (ref 0.0–0.5)
EOS%: 2 % (ref 0.0–7.0)
HCT: 35.1 % — ABNORMAL LOW (ref 38.4–49.9)
HEMOGLOBIN: 12.3 g/dL — AB (ref 13.0–17.1)
Immature Retic Fract: 2 % — ABNORMAL LOW (ref 3.00–10.60)
LYMPH#: 0.9 10*3/uL (ref 0.9–3.3)
LYMPH%: 25.4 % (ref 14.0–49.0)
MCH: 34.9 pg — AB (ref 27.2–33.4)
MCHC: 35 g/dL (ref 32.0–36.0)
MCV: 99.7 fL — ABNORMAL HIGH (ref 79.3–98.0)
MONO#: 0.8 10*3/uL (ref 0.1–0.9)
MONO%: 23.7 % — AB (ref 0.0–14.0)
NEUT#: 1.7 10*3/uL (ref 1.5–6.5)
NEUT%: 48.3 % (ref 39.0–75.0)
Platelets: 49 10*3/uL — ABNORMAL LOW (ref 140–400)
RBC: 3.52 10*6/uL — ABNORMAL LOW (ref 4.20–5.82)
RDW: 14.7 % — AB (ref 11.0–14.6)
RETIC %: 3.26 % — AB (ref 0.80–1.80)
Retic Ct Abs: 114.75 10*3/uL — ABNORMAL HIGH (ref 34.80–93.90)
WBC: 3.5 10*3/uL — AB (ref 4.0–10.3)

## 2016-06-13 LAB — FERRITIN: Ferritin: 427 ng/ml — ABNORMAL HIGH (ref 22–316)

## 2016-06-13 NOTE — Progress Notes (Signed)
550 grams removed from right/left AC. Drinks/snacks provided and patient discharged in stable condition after 30 minute post observation

## 2016-06-13 NOTE — Patient Instructions (Signed)
Therapeutic Phlebotomy Therapeutic phlebotomy is the controlled removal of blood from a person's body for the purpose of treating a medical condition. The procedure is similar to donating blood. Usually, about a pint (470 mL, or 0.47L) of blood is removed. The average adult has 9-12 pints (4.3-5.7 L) of blood. Therapeutic phlebotomy may be used to treat the following medical conditions:  Hemochromatosis. This is a condition in which the blood contains too much iron.  Polycythemia vera. This is a condition in which the blood contains too many red blood cells.  Porphyria cutanea tarda. This is a disease in which an important part of hemoglobin is not made properly. It results in the buildup of abnormal amounts of porphyrins in the body.  Sickle cell disease. This is a condition in which the red blood cells form an abnormal crescent shape rather than a round shape. Tell a health care provider about:  Any allergies you have.  All medicines you are taking, including vitamins, herbs, eye drops, creams, and over-the-counter medicines.  Any problems you or family members have had with anesthetic medicines.  Any blood disorders you have.  Any surgeries you have had.  Any medical conditions you have. What are the risks? Generally, this is a safe procedure. However, problems may occur, including:  Nausea or light-headedness.  Low blood pressure.  Soreness, bleeding, swelling, or bruising at the needle insertion site.  Infection. What happens before the procedure?  Follow instructions from your health care provider about eating or drinking restrictions.  Ask your health care provider about changing or stopping your regular medicines. This is especially important if you are taking diabetes medicines or blood thinners.  Wear clothing with sleeves that can be raised above the elbow.  Plan to have someone take you home after the procedure.  You may have a blood sample taken. What happens  during the procedure?  A needle will be inserted into one of your veins.  Tubing and a collection bag will be attached to that needle.  Blood will flow through the needle and tubing into the collection bag.  You may be asked to open and close your hand slowly and continually during the entire collection.  After the specified amount of blood has been removed from your body, the collection bag and tubing will be clamped.  The needle will be removed from your vein.  Pressure will be held on the site of the needle insertion to stop the bleeding.  A bandage (dressing) will be placed over the needle insertion site. The procedure may vary among health care providers and hospitals. What happens after the procedure?  Your recovery will be assessed and monitored.  You can return to your normal activities as directed by your health care provider. This information is not intended to replace advice given to you by your health care provider. Make sure you discuss any questions you have with your health care provider. Document Released: 09/16/2010 Document Revised: 12/15/2015 Document Reviewed: 04/10/2014 Elsevier Interactive Patient Education  2017 [employer]vier Inc.  

## 2016-06-18 MED FILL — traZODone HCL 50 MG TABS: 50 | 30 days supply | Qty: 30 | Fill #0

## 2016-06-18 MED FILL — LACTULOSE 10 GM/15 ML SOLN: 10 | 14 days supply | Qty: 1892 | Fill #0

## 2016-06-27 NOTE — Telephone Encounter (Signed)
Sent to Isaiah Blakes to see another Dr in the practice

## 2016-06-27 NOTE — Telephone Encounter (Signed)
The last message was an error.  Please disregard.

## 2016-07-07 ENCOUNTER — Telehealth: Payer: Self-pay | Admitting: Hematology and Oncology

## 2016-07-07 ENCOUNTER — Other Ambulatory Visit (HOSPITAL_BASED_OUTPATIENT_CLINIC_OR_DEPARTMENT_OTHER): Payer: Medicaid Other

## 2016-07-07 ENCOUNTER — Encounter: Payer: Self-pay | Admitting: Hematology and Oncology

## 2016-07-07 ENCOUNTER — Ambulatory Visit (HOSPITAL_BASED_OUTPATIENT_CLINIC_OR_DEPARTMENT_OTHER): Payer: Medicaid Other | Admitting: Hematology and Oncology

## 2016-07-07 ENCOUNTER — Ambulatory Visit (HOSPITAL_BASED_OUTPATIENT_CLINIC_OR_DEPARTMENT_OTHER): Payer: Medicaid Other

## 2016-07-07 DIAGNOSIS — K7031 Alcoholic cirrhosis of liver with ascites: Secondary | ICD-10-CM

## 2016-07-07 DIAGNOSIS — F1020 Alcohol dependence, uncomplicated: Secondary | ICD-10-CM | POA: Diagnosis not present

## 2016-07-07 LAB — CBC & DIFF AND RETIC
BASO%: 0.7 % (ref 0.0–2.0)
BASOS ABS: 0 10*3/uL (ref 0.0–0.1)
EOS%: 1.5 % (ref 0.0–7.0)
Eosinophils Absolute: 0.1 10*3/uL (ref 0.0–0.5)
HEMATOCRIT: 35.8 % — AB (ref 38.4–49.9)
HGB: 12.4 g/dL — ABNORMAL LOW (ref 13.0–17.1)
IMMATURE RETIC FRACT: 2.2 % — AB (ref 3.00–10.60)
LYMPH%: 16.6 % (ref 14.0–49.0)
MCH: 35.1 pg — ABNORMAL HIGH (ref 27.2–33.4)
MCHC: 34.6 g/dL (ref 32.0–36.0)
MCV: 101.4 fL — AB (ref 79.3–98.0)
MONO#: 0.9 10*3/uL (ref 0.1–0.9)
MONO%: 20.5 % — ABNORMAL HIGH (ref 0.0–14.0)
NEUT#: 2.8 10*3/uL (ref 1.5–6.5)
NEUT%: 60.7 % (ref 39.0–75.0)
PLATELETS: 50 10*3/uL — AB (ref 140–400)
RBC: 3.53 10*6/uL — AB (ref 4.20–5.82)
RDW: 14.9 % — ABNORMAL HIGH (ref 11.0–14.6)
Retic %: 2.65 % — ABNORMAL HIGH (ref 0.80–1.80)
Retic Ct Abs: 93.55 10*3/uL (ref 34.80–93.90)
WBC: 4.6 10*3/uL (ref 4.0–10.3)
lymph#: 0.8 10*3/uL — ABNORMAL LOW (ref 0.9–3.3)

## 2016-07-07 LAB — FERRITIN: FERRITIN: 315 ng/mL (ref 22–316)

## 2016-07-07 NOTE — Patient Instructions (Signed)
Therapeutic Phlebotomy Therapeutic phlebotomy is the controlled removal of blood from a person's body for the purpose of treating a medical condition. The procedure is similar to donating blood. Usually, about a pint (470 mL, or 0.47L) of blood is removed. The average adult has 9-12 pints (4.3-5.7 L) of blood. Therapeutic phlebotomy may be used to treat the following medical conditions:  Hemochromatosis. This is a condition in which the blood contains too much iron.  Polycythemia vera. This is a condition in which the blood contains too many red blood cells.  Porphyria cutanea tarda. This is a disease in which an important part of hemoglobin is not made properly. It results in the buildup of abnormal amounts of porphyrins in the body.  Sickle cell disease. This is a condition in which the red blood cells form an abnormal crescent shape rather than a round shape. Tell a health care provider about:  Any allergies you have.  All medicines you are taking, including vitamins, herbs, eye drops, creams, and over-the-counter medicines.  Any problems you or family members have had with anesthetic medicines.  Any blood disorders you have.  Any surgeries you have had.  Any medical conditions you have. What are the risks? Generally, this is a safe procedure. However, problems may occur, including:  Nausea or light-headedness.  Low blood pressure.  Soreness, bleeding, swelling, or bruising at the needle insertion site.  Infection. What happens before the procedure?  Follow instructions from your health care provider about eating or drinking restrictions.  Ask your health care provider about changing or stopping your regular medicines. This is especially important if you are taking diabetes medicines or blood thinners.  Wear clothing with sleeves that can be raised above the elbow.  Plan to have someone take you home after the procedure.  You may have a blood sample taken. What happens  during the procedure?  A needle will be inserted into one of your veins.  Tubing and a collection bag will be attached to that needle.  Blood will flow through the needle and tubing into the collection bag.  You may be asked to open and close your hand slowly and continually during the entire collection.  After the specified amount of blood has been removed from your body, the collection bag and tubing will be clamped.  The needle will be removed from your vein.  Pressure will be held on the site of the needle insertion to stop the bleeding.  A bandage (dressing) will be placed over the needle insertion site. The procedure may vary among health care providers and hospitals. What happens after the procedure?  Your recovery will be assessed and monitored.  You can return to your normal activities as directed by your health care provider. This information is not intended to replace advice given to you by your health care provider. Make sure you discuss any questions you have with your health care provider. Document Released: 09/16/2010 Document Revised: 12/15/2015 Document Reviewed: 04/10/2014 Elsevier Interactive Patient Education  2017 [employer]vier Inc.  

## 2016-07-07 NOTE — Assessment & Plan Note (Signed)
The patient is asymptomatic. I recommend close follow-up with liver specialist for close monitoring. His recent liver biopsy was negative for hepatoma I also recommend he calls his primary doctor or GI doctor for medication refills

## 2016-07-07 NOTE — Progress Notes (Signed)
[place] OFFICE PROGRESS NOTE  PLACEY,MARY H, NP SUMMARY OF HEMATOLOGIC HISTORY:  CASTEN FLOREN is here because of thrombocytopenia.  He was found to have abnormal CBC from recent blood work. This patient have chronic untreated hepatitis C, liver disease with cirrhosis and chronic alcoholism. He was recently discharged from the hospital with acute encephalopathy which improved upon supportive care management. He denies recent bruising/bleeding, such as spontaneous epistaxis, hematuria, melena or hematochezia The patient had poor social circumstances. He lives in a group home/facility. He continues to drink on a regular basis, last alcohol intake was over a day ago. He has not been treated for hepatitis C, presumably due to noncompliance and chronic alcoholism. He has chronic right lower abdominal pain from abdominal hernia but that was not surgically repaired due to chronic thrombocytopenia In May, he had extensive workup including liver biopsy which excluded hepatoma Starting September 2017, he had regular phlebotomy every 3 weeks Starting March 2018, the frequency of phlebotomy is reduced to every 3 months due to  reduced ferritin level INTERVAL HISTORY: QUINDARIUS CABELLO 61 y.o. male returns for further follow-up. He had recurrent admission to the hospital for altered mental status. He is running low on all his medication and requests me to refill his prescription.  I declined and redirected him to call his primary care's office for medication refills. He was drinking alcohol up until a month ago. He denies recent infection The patient denies any recent signs or symptoms of bleeding such as spontaneous epistaxis, hematuria or hematochezia.   I have reviewed the past medical history, past surgical history, social history and family history with the patient and they are unchanged from previous note.  ALLERGIES:  has No Known Allergies.  MEDICATIONS:  Current Outpatient  Prescriptions  Medication Sig Dispense Refill  . folic acid (FOLVITE) 1 MG tablet Take 1 tablet (1 mg total) by mouth daily.    . folic acid (FOLVITE) 1 MG tablet Take 1 tablet (1 mg total) by mouth daily. 30 tablet 0  . furosemide (LASIX) 40 MG tablet Take 1 tablet (40 mg total) by mouth 2 (two) times daily. 60 tablet 3  . furosemide (LASIX) 40 MG tablet Take 1 tablet (40 mg total) by mouth daily. 30 tablet 0  . KLOR-CON M20 20 MEQ tablet Take 1 tablet (20 mEq total) by mouth 2 (two) times daily. 60 tablet 2  . lactulose (CHRONULAC) 10 GM/15ML solution Take 30 mLs (20 g total) by mouth 3 (three) times daily. 473 mL 0  . lactulose (CHRONULAC) 10 GM/15ML solution Take 45 mLs (30 g total) by mouth 3 (three) times daily. 1892 mL 0  . magnesium oxide (MAG-OX) 400 (241.3 Mg) MG tablet Take 1 tablet (400 mg total) by mouth daily. 30 tablet 0  . methocarbamol (ROBAXIN) 500 MG tablet Take 1 tablet (500 mg total) by mouth 2 (two) times daily. (Patient not taking: Reported on 04/26/2016) 20 tablet 0  . Multiple Vitamin (MULTIVITAMIN) tablet Take 1 tablet by mouth daily.    . nicotine (NICODERM CQ - DOSED IN MG/24 HOURS) 21 mg/24hr patch Place 1 patch (21 mg total) onto the skin daily. 28 patch 0  . omeprazole (PRILOSEC) 20 MG capsule Take 1 capsule (20 mg total) by mouth daily. 30 capsule 2  . omeprazole (PRILOSEC) 20 MG capsule Take 1 capsule (20 mg total) by mouth 2 (two) times daily before a meal. 30 capsule 0  . ondansetron (ZOFRAN) 4 MG tablet Take 1  tablet (4 mg total) by mouth every 6 (six) hours. 12 tablet 0  . potassium chloride SA (K-DUR,KLOR-CON) 20 MEQ tablet Take 1 tablet (20 mEq total) by mouth daily. 30 tablet 0  . rifaximin (XIFAXAN) 550 MG TABS tablet Take 1 tablet (550 mg total) by mouth 2 (two) times daily. 60 tablet prn  . rifaximin (XIFAXAN) 550 MG TABS tablet Take 1 tablet (550 mg total) by mouth 2 (two) times daily. 60 tablet 0  . spironolactone (ALDACTONE) 100 MG tablet Take 0.5  tablets (50 mg total) by mouth daily. 30 tablet 3  . spironolactone (ALDACTONE) 100 MG tablet Take 2 tablets (200 mg total) by mouth daily. 60 tablet 0   No current facility-administered medications for this visit.      REVIEW OF SYSTEMS:   Constitutional: Denies fevers, chills or night sweats Eyes: Denies blurriness of vision Ears, nose, mouth, throat, and face: Denies mucositis or sore throat Respiratory: Denies cough, dyspnea or wheezes Cardiovascular: Denies palpitation, chest discomfort or lower extremity swelling Gastrointestinal:  Denies nausea, heartburn or change in bowel habits Skin: Denies abnormal skin rashes Lymphatics: Denies new lymphadenopathy or easy bruising Neurological:Denies numbness, tingling or new weaknesses Behavioral/Psych: Mood is stable, no new changes  All other systems were reviewed with the patient and are negative.  PHYSICAL EXAMINATION: ECOG PERFORMANCE STATUS: 1 - Symptomatic but completely ambulatory  Vitals:   07/07/16 1304  BP: 136/68  Pulse: 70  Resp: 18  Temp: 98.2 F (36.8 C)   Filed Weights   07/07/16 1304  Weight: 196 lb 14.4 oz (89.3 kg)    GENERAL:alert, no distress and comfortable SKIN: skin color, texture, turgor are normal, no rashes or significant lesions EYES: normal, Conjunctiva are pink and non-injected, sclera clear OROPHARYNX:no exudate, no erythema and lips, buccal mucosa, and tongue normal  NECK: supple, thyroid normal size, non-tender, without nodularity LYMPH:  no palpable lymphadenopathy in the cervical, axillary or inguinal LUNGS: clear to auscultation and percussion with normal breathing effort HEART: regular rate & rhythm and no murmurs and no lower extremity edema ABDOMEN:abdomen soft, distended with ascites Musculoskeletal:no cyanosis of digits and no clubbing  NEURO: alert & oriented x 3 with fluent speech, no focal motor/sensory deficits  LABORATORY DATA:  I have reviewed the data as listed      Component Value Date/Time   NA 130 (L) 06/09/2016 0358   NA 137 01/04/2016 0809   K 3.3 (L) 06/09/2016 0358   K 4.5 01/04/2016 0809   CL 106 06/09/2016 0358   CO2 20 (L) 06/09/2016 0358   CO2 18 (L) 01/04/2016 0809   GLUCOSE 127 (H) 06/09/2016 0358   GLUCOSE 109 01/04/2016 0809   BUN 10 06/09/2016 0358   BUN 14.5 01/04/2016 0809   CREATININE 0.79 06/09/2016 0358   CREATININE 1.0 01/04/2016 0809   CALCIUM 7.3 (L) 06/09/2016 0358   CALCIUM 8.3 (L) 01/04/2016 0809   PROT 5.7 (L) 06/08/2016 0322   PROT 6.8 01/04/2016 0809   ALBUMIN 1.7 (L) 06/08/2016 0322   ALBUMIN 2.3 (L) 01/04/2016 0809   AST 79 (H) 06/08/2016 0322   AST 98 (H) 01/04/2016 0809   ALT 40 06/08/2016 0322   ALT 52 01/04/2016 0809   ALKPHOS 188 (H) 06/08/2016 0322   ALKPHOS 231 (H) 01/04/2016 0809   BILITOT 1.6 (H) 06/08/2016 0322   BILITOT 2.10 (H) 01/04/2016 0809   GFRNONAA >60 06/09/2016 0358   GFRNONAA >89 10/13/2013 1156   GFRAA >60 06/09/2016 0358   GFRAA >89  10/13/2013 1156    No results found for: SPEP, UPEP  Lab Results  Component Value Date   WBC 4.6 07/07/2016   NEUTROABS 2.8 07/07/2016   HGB 12.4 (L) 07/07/2016   HCT 35.8 (L) 07/07/2016   MCV 101.4 (H) 07/07/2016   PLT 50 (L) 07/07/2016      Chemistry      Component Value Date/Time   NA 130 (L) 06/09/2016 0358   NA 137 01/04/2016 0809   K 3.3 (L) 06/09/2016 0358   K 4.5 01/04/2016 0809   CL 106 06/09/2016 0358   CO2 20 (L) 06/09/2016 0358   CO2 18 (L) 01/04/2016 0809   BUN 10 06/09/2016 0358   BUN 14.5 01/04/2016 0809   CREATININE 0.79 06/09/2016 0358   CREATININE 1.0 01/04/2016 0809   GLU 117 09/12/2015      Component Value Date/Time   CALCIUM 7.3 (L) 06/09/2016 0358   CALCIUM 8.3 (L) 01/04/2016 0809   ALKPHOS 188 (H) 06/08/2016 0322   ALKPHOS 231 (H) 01/04/2016 0809   AST 79 (H) 06/08/2016 0322   AST 98 (H) 01/04/2016 0809   ALT 40 06/08/2016 0322   ALT 52 01/04/2016 0809   BILITOT 1.6 (H) 06/08/2016 0322   BILITOT 2.10  (H) 01/04/2016 0809      ASSESSMENT & PLAN:  Hemochromatosis The patient has signs of iron overload and  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 He appears to be responding well to phlebotomy. I plan to space out his treatment to every 3 months after today's treatment I discussed the risk, benefits, side effects of phlebotomy and he agreed to proceed  The goal would be to keep ferritin level to within normal range.  Alcoholic cirrhosis of liver with ascites ([place]) The patient is asymptomatic. I recommend close follow-up with liver specialist for close monitoring. His recent liver biopsy was negative for hepatoma I also recommend he calls his primary doctor or GI doctor for medication refills  Alcoholism [facility]) The patient had recent alcohol intake up until a month ago. We discussed importance of him staying abstinent   No orders of the defined types were placed in this encounter.   All questions were answered. The patient knows to call the clinic with any problems, questions or concerns. No barriers to learning was detected.  I spent 10 minutes counseling the patient face to face. The total time spent in the appointment was 15 minutes and more than 50% was on counseling.     Heath Lark, MD 3/12/20182:03 PM

## 2016-07-07 NOTE — Telephone Encounter (Signed)
Appointments scheduled per 07/07/16 los. Patient was given a copy of the AVS report and appointment schedule, per 07/07/16 los.

## 2016-07-07 NOTE — Assessment & Plan Note (Signed)
The patient had recent alcohol intake up until a month ago. We discussed importance of him staying abstinent

## 2016-07-07 NOTE — Assessment & Plan Note (Signed)
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 He appears to be responding well to phlebotomy. I plan to space out his treatment to every 3 months after today's treatment I discussed the risk, benefits, side effects of phlebotomy and he agreed to proceed  The goal would be to keep ferritin level to within normal range.

## 2016-07-08 MED FILL — KLOR-CON M20 TABLET: 20 | 30 days supply | Qty: 60 | Fill #2

## 2016-07-08 MED FILL — TRIAMTERENE-HCTZ 37.5-25 MG: 37.5-25 | 30 days supply | Qty: 30 | Fill #2

## 2016-07-08 MED FILL — OMEPRAZOLE DR 20 MG CAPSULE: 20 | 30 days supply | Qty: 30 | Fill #2

## 2016-07-09 MED FILL — FUROSEMIDE 40 MG TABLET: 40 | 30 days supply | Qty: 90 | Fill #0

## 2016-07-09 MED FILL — FLUTICASONE PROP 50 MCG SPR: 50 | 60 days supply | Qty: 16 | Fill #0

## 2016-07-09 MED FILL — ONDANSETRON HCL 8 MG TABLET: 8 | 5 days supply | Qty: 15 | Fill #0

## 2016-07-09 MED FILL — PROAIR HFA 90 MCG INHALER: 108 (90 BAS | 25 days supply | Qty: 9 | Fill #0

## 2016-07-15 ENCOUNTER — Encounter (HOSPITAL_COMMUNITY): Payer: Self-pay | Admitting: Emergency Medicine

## 2016-07-15 ENCOUNTER — Emergency Department (HOSPITAL_COMMUNITY): Payer: Medicaid Other

## 2016-07-15 ENCOUNTER — Other Ambulatory Visit: Payer: Self-pay

## 2016-07-15 ENCOUNTER — Inpatient Hospital Stay (HOSPITAL_COMMUNITY)
Admission: EM | Admit: 2016-07-15 | Discharge: 2016-07-18 | DRG: 434 | Disposition: A | Discharge status: Home / Self Care | Payer: Medicaid Other | Attending: Internal Medicine | Admitting: Internal Medicine

## 2016-07-15 DIAGNOSIS — K7682 Hepatic encephalopathy: Secondary | ICD-10-CM | POA: Diagnosis present

## 2016-07-15 DIAGNOSIS — K7031 Alcoholic cirrhosis of liver with ascites: Secondary | ICD-10-CM | POA: Diagnosis not present

## 2016-07-15 DIAGNOSIS — K7200 Acute and subacute hepatic failure without coma: Secondary | ICD-10-CM | POA: Diagnosis present

## 2016-07-15 DIAGNOSIS — I10 Essential (primary) hypertension: Secondary | ICD-10-CM | POA: Diagnosis not present

## 2016-07-15 DIAGNOSIS — B182 Chronic viral hepatitis C: Secondary | ICD-10-CM | POA: Diagnosis present

## 2016-07-15 DIAGNOSIS — Z833 Family history of diabetes mellitus: Secondary | ICD-10-CM

## 2016-07-15 DIAGNOSIS — B1920 Unspecified viral hepatitis C without hepatic coma: Secondary | ICD-10-CM | POA: Diagnosis present

## 2016-07-15 DIAGNOSIS — E119 Type 2 diabetes mellitus without complications: Secondary | ICD-10-CM | POA: Insufficient documentation

## 2016-07-15 DIAGNOSIS — K469 Unspecified abdominal hernia without obstruction or gangrene: Secondary | ICD-10-CM | POA: Diagnosis present

## 2016-07-15 DIAGNOSIS — F329 Major depressive disorder, single episode, unspecified: Secondary | ICD-10-CM | POA: Diagnosis present

## 2016-07-15 DIAGNOSIS — Z9119 Patient's noncompliance with other medical treatment and regimen: Secondary | ICD-10-CM

## 2016-07-15 DIAGNOSIS — E1165 Type 2 diabetes mellitus with hyperglycemia: Secondary | ICD-10-CM | POA: Diagnosis present

## 2016-07-15 DIAGNOSIS — R739 Hyperglycemia, unspecified: Secondary | ICD-10-CM | POA: Diagnosis present

## 2016-07-15 DIAGNOSIS — Z7951 Long term (current) use of inhaled steroids: Secondary | ICD-10-CM

## 2016-07-15 DIAGNOSIS — Z8249 Family history of ischemic heart disease and other diseases of the circulatory system: Secondary | ICD-10-CM

## 2016-07-15 DIAGNOSIS — Z79899 Other long term (current) drug therapy: Secondary | ICD-10-CM

## 2016-07-15 DIAGNOSIS — D696 Thrombocytopenia, unspecified: Secondary | ICD-10-CM | POA: Diagnosis not present

## 2016-07-15 DIAGNOSIS — K7290 Hepatic failure, unspecified without coma: Secondary | ICD-10-CM

## 2016-07-15 DIAGNOSIS — F1020 Alcohol dependence, uncomplicated: Secondary | ICD-10-CM | POA: Diagnosis present

## 2016-07-15 DIAGNOSIS — E876 Hypokalemia: Secondary | ICD-10-CM | POA: Diagnosis not present

## 2016-07-15 DIAGNOSIS — K7040 Alcoholic hepatic failure without coma: Principal | ICD-10-CM | POA: Diagnosis present

## 2016-07-15 DIAGNOSIS — Y900 Blood alcohol level of less than 20 mg/100 ml: Secondary | ICD-10-CM | POA: Diagnosis present

## 2016-07-15 HISTORY — DX: Abnormal electrocardiogram (ECG) (EKG): R94.31

## 2016-07-15 LAB — URINALYSIS, ROUTINE W REFLEX MICROSCOPIC
BILIRUBIN URINE: NEGATIVE
Bacteria, UA: NONE SEEN
KETONES UR: NEGATIVE mg/dL
LEUKOCYTES UA: NEGATIVE
Nitrite: NEGATIVE
PH: 6 (ref 5.0–8.0)
Protein, ur: NEGATIVE mg/dL
Specific Gravity, Urine: 1.021 (ref 1.005–1.030)

## 2016-07-15 LAB — COMPREHENSIVE METABOLIC PANEL
ALK PHOS: 243 U/L — AB (ref 38–126)
ALT: 53 U/L (ref 17–63)
ANION GAP: 6 (ref 5–15)
AST: 118 U/L — ABNORMAL HIGH (ref 15–41)
Albumin: 2.5 g/dL — ABNORMAL LOW (ref 3.5–5.0)
BILIRUBIN TOTAL: 1.6 mg/dL — AB (ref 0.3–1.2)
BUN: 13 mg/dL (ref 6–20)
CALCIUM: 8 mg/dL — AB (ref 8.9–10.3)
CO2: 23 mmol/L (ref 22–32)
Chloride: 109 mmol/L (ref 101–111)
Creatinine, Ser: 1.18 mg/dL (ref 0.61–1.24)
GFR calc Af Amer: >60 mL/min (ref >60)
Glucose, Bld: 70 mg/dL (ref 65–99)
POTASSIUM: 3.5 mmol/L (ref 3.5–5.1)
Sodium: 138 mmol/L (ref 135–145)
TOTAL PROTEIN: 7.3 g/dL (ref 6.5–8.1)

## 2016-07-15 LAB — RAPID URINE DRUG SCREEN, HOSP PERFORMED
Amphetamines: NOT DETECTED
Barbiturates: NOT DETECTED
Benzodiazepines: NOT DETECTED
COCAINE: NOT DETECTED
Opiates: NOT DETECTED
Tetrahydrocannabinol: NOT DETECTED

## 2016-07-15 LAB — CBC
HEMATOCRIT: 37.4 % — AB (ref 39.0–52.0)
HEMOGLOBIN: 13.2 g/dL (ref 13.0–17.0)
MCH: 35.1 pg — AB (ref 26.0–34.0)
MCHC: 35.3 g/dL (ref 30.0–36.0)
MCV: 99.5 fL (ref 78.0–100.0)
Platelets: 65 10*3/uL — ABNORMAL LOW (ref 150–400)
RBC: 3.76 MIL/uL — AB (ref 4.22–5.81)
RDW: 14.5 % (ref 11.5–15.5)
WBC: 3.4 10*3/uL — AB (ref 4.0–10.5)

## 2016-07-15 LAB — I-STAT TROPONIN, ED: Troponin i, poc: 0.01 ng/mL (ref 0.00–0.08)

## 2016-07-15 LAB — CBG MONITORING, ED
GLUCOSE-CAPILLARY: 236 mg/dL — AB (ref 65–99)
GLUCOSE-CAPILLARY: 123 mg/dL — AB (ref 65–99)
GLUCOSE-CAPILLARY: 115 mg/dL — AB (ref 65–99)

## 2016-07-15 LAB — ETHANOL: Alcohol, Ethyl (B): <5 mg/dL (ref <5)

## 2016-07-15 LAB — LIPASE, BLOOD: Lipase: 63 U/L — ABNORMAL HIGH (ref 11–51)

## 2016-07-15 LAB — AMMONIA: Ammonia: 77 umol/L — ABNORMAL HIGH (ref 9–35)

## 2016-07-15 MED ORDER — LACTULOSE 10 GM/15ML PO SOLN
30.0000 g | Freq: Once | ORAL | Status: AC
  Administered 2016-07-15: 30 g via ORAL
  Filled 2016-07-15: qty 45

## 2016-07-15 MED ORDER — ONDANSETRON HCL 8 MG PO TABS: 8.0000 mg | ORAL_TABLET | Freq: Three times a day (TID) | ORAL | Status: DC | PRN

## 2016-07-15 MED ORDER — FOLIC ACID 1 MG PO TABS
1.0000 mg | ORAL_TABLET | Freq: Every day | ORAL | Status: DC
  Administered 2016-07-16 – 2016-07-18 (×3): 1 mg via ORAL
  Filled 2016-07-15 (×3): qty 1

## 2016-07-15 MED ORDER — FUROSEMIDE 40 MG PO TABS
40.0000 mg | ORAL_TABLET | Freq: Every day | ORAL | Status: DC
  Administered 2016-07-16 – 2016-07-18 (×3): 40 mg via ORAL
  Filled 2016-07-15 (×3): qty 1

## 2016-07-15 MED ORDER — SODIUM CHLORIDE 0.9 % IV BOLUS (SEPSIS)
1000.0000 mL | Freq: Once | INTRAVENOUS | Status: AC
  Administered 2016-07-15: 1000 mL via INTRAVENOUS

## 2016-07-15 MED ORDER — NICOTINE 21 MG/24HR TD PT24
21.0000 mg | MEDICATED_PATCH | Freq: Every day | TRANSDERMAL | Status: DC
  Administered 2016-07-15 – 2016-07-18 (×4): 21 mg via TRANSDERMAL
  Filled 2016-07-15 (×4): qty 1

## 2016-07-15 MED ORDER — FLUTICASONE PROPIONATE 50 MCG/ACT NA SUSP
2.0000 | Freq: Every day | NASAL | Status: DC
  Administered 2016-07-16 – 2016-07-18 (×2): 2 via NASAL
  Filled 2016-07-15: qty 16

## 2016-07-15 MED ORDER — LACTULOSE 10 GM/15ML PO SOLN
30.0000 g | Freq: Three times a day (TID) | ORAL | Status: DC
  Administered 2016-07-16 – 2016-07-18 (×7): 30 g via ORAL
  Filled 2016-07-15 (×7): qty 45

## 2016-07-15 MED ORDER — POTASSIUM CHLORIDE CRYS ER 20 MEQ PO TBCR
20.0000 meq | EXTENDED_RELEASE_TABLET | Freq: Every day | ORAL | Status: DC
  Administered 2016-07-16 – 2016-07-18 (×3): 20 meq via ORAL
  Filled 2016-07-15 (×3): qty 1

## 2016-07-15 MED ORDER — RIFAXIMIN 550 MG PO TABS
550.0000 mg | ORAL_TABLET | Freq: Two times a day (BID) | ORAL | Status: DC
Start: 2016-07-15 — End: 2016-07-18
  Administered 2016-07-15 – 2016-07-18 (×6): 550 mg via ORAL
  Filled 2016-07-15 (×6): qty 1

## 2016-07-15 MED ORDER — SPIRONOLACTONE 100 MG PO TABS
200.0000 mg | ORAL_TABLET | Freq: Every day | ORAL | Status: DC
Start: 2016-07-16 — End: 2016-07-18
  Administered 2016-07-16 – 2016-07-18 (×3): 200 mg via ORAL
  Filled 2016-07-15 (×3): qty 2

## 2016-07-15 MED ORDER — SODIUM CHLORIDE 0.9 % IV BOLUS (SEPSIS)
1000.0000 mL | Freq: Once | INTRAVENOUS | Status: AC
Start: 2016-07-15 — End: 2016-07-15
  Administered 2016-07-15: 1000 mL via INTRAVENOUS

## 2016-07-15 MED ORDER — INSULIN ASPART 100 UNIT/ML ~~LOC~~ SOLN
0.0000 [IU] | Freq: Three times a day (TID) | SUBCUTANEOUS | Status: DC
  Administered 2016-07-16 (×3): 1 [IU] via SUBCUTANEOUS

## 2016-07-15 MED ORDER — LACTULOSE 10 GM/15ML PO SOLN
10.0000 g | Freq: Once | ORAL | Status: DC
  Filled 2016-07-15: qty 15

## 2016-07-15 MED ORDER — ONDANSETRON 4 MG PO TBDP
4.0000 mg | ORAL_TABLET | Freq: Once | ORAL | Status: AC
  Administered 2016-07-15: 4 mg via ORAL
  Filled 2016-07-15: qty 1

## 2016-07-15 MED ORDER — ALBUTEROL SULFATE (2.5 MG/3ML) 0.083% IN NEBU: 3.0000 mL | INHALATION_SOLUTION | Freq: Four times a day (QID) | RESPIRATORY_TRACT | Status: DC | PRN

## 2016-07-15 MED ORDER — PANTOPRAZOLE SODIUM 40 MG PO TBEC
40.0000 mg | DELAYED_RELEASE_TABLET | Freq: Two times a day (BID) | ORAL | Status: DC
  Administered 2016-07-16 – 2016-07-18 (×5): 40 mg via ORAL
  Filled 2016-07-15 (×5): qty 1

## 2016-07-15 MED ORDER — TRAZODONE HCL 50 MG PO TABS: 100.0000 mg | ORAL_TABLET | Freq: Every evening | ORAL | Status: DC | PRN

## 2016-07-15 MED ORDER — ADULT MULTIVITAMIN W/MINERALS CH
1.0000 | ORAL_TABLET | Freq: Every day | ORAL | Status: DC
  Administered 2016-07-16 – 2016-07-18 (×3): 1 via ORAL
  Filled 2016-07-15 (×3): qty 1

## 2016-07-15 NOTE — ED Triage Notes (Signed)
Per EMS, patient from PCP, where his CBG was 394. Hx diabetes. Patient is non compliant. Denies pain. Ambulatory with EMS. 18g L AC. Patient given 15 units of insulin at PCP. CBG 359 with EMS.

## 2016-07-15 NOTE — Progress Notes (Signed)
Consult request has been received. CSW following up at present time.  Janely Gullickson F. Lorijean Husser, LCSWA, LCAS Clinical Social Worker Ph: [phone]  

## 2016-07-15 NOTE — ED Notes (Signed)
Bed: EV03 Expected date:  Expected time:  Means of arrival:  Comments: 61 yo hyperglycemia

## 2016-07-15 NOTE — ED Provider Notes (Signed)
Jose [name] is a 61 y.o. male, with a history of alcohol abuse, hepatic cirrhosis, hepatitis C, and HTN, presenting to the ED with reported confusion and gait instability.  He states he has not been taking his medications, specifically his lactulose, stating he ran out at least a week ago. Patient states he "feels fine." He denies alcohol or illicit drug use "for at least a month or two."  HPI from Heath Lark, PA-C: "Jose [name] is a 61 y.o. male with PMHx of hepatic encephalopathy, hepatitis C, cirrhosis, ascites, thrombocytopenia, hemochromatosis, alcohol abuse, IDDM, recently seen in ED for acute encephalopathy, presents today by EMS from [facility] where is CBG was 394 presents today with complaints of nausea and trouble walking since last night. He denies changes in bowel movements, changes in urinary symptoms. He denies chest pain and shortness of breath, vomiting, fevers, abdominal pain. He denies bleeding or bruising.  He states nothing makes his symptoms better or worse. He reports taking all his medications as prescribed. He states the last dose of medications he took was yesterday morning. He reports not drinking alcohol in over "2-4 weeks." He admits to history of crack cocaine and going to drugs anonymous classes. He states he has not used in over a month and half. He has asked several times for food. He reports not having had any food since yesterday.     Per EMR: Pt lives in Group home. Recurrent admissions to hospital for AMS. He has chronic right lower abdominal pain from abdominal hernia but not surgically repaired due to chronic thrombocytopenia. In may  He had extensive workup including liver biopsy which excluded hepatoma. Starting September 2017 he had regular phlebotomy. Starting March 2018 frequency of phlebotomy reduced to every 3 months due to reduced ferritin level."    Past Medical History:  Diagnosis Date  . Alcohol abuse   . Alcohol dependence ([place])   . Alcoholism  ([place]) 07/12/2015  . Ascites   . Cirrhosis ([place])   . Depression   . Hepatitis C   . Hypertension   . QT prolongation   . Shortness of breath    with exertion  . Thrombocytopenia ([facility])     Current Facility-Administered Medications (Endocrine & Metabolic):  Derrill Memo ON 07/16/2016] insulin aspart (novoLOG) injection 0-9 Units   Current Facility-Administered Medications (Cardiovascular):  Derrill Memo ON 07/16/2016] furosemide (LASIX) tablet 40 mg .  [START ON 07/16/2016] spironolactone (ALDACTONE) tablet 200 mg  Current Outpatient Prescriptions (Cardiovascular):  .  furosemide (LASIX) 40 MG tablet, Take 1 tablet (40 mg total) by mouth daily. Marland Kitchen  spironolactone (ALDACTONE) 100 MG tablet, Take 2 tablets (200 mg total) by mouth daily.  Current Facility-Administered Medications (Respiratory):  .  albuterol (PROVENTIL HFA;VENTOLIN HFA) 108 (90 Base) MCG/ACT inhaler 2 puff .  [START ON 07/16/2016] fluticasone (FLONASE) 50 MCG/ACT nasal spray 2 spray  Current Outpatient Prescriptions (Respiratory):  .  albuterol (PROVENTIL HFA;VENTOLIN HFA) 108 (90 Base) MCG/ACT inhaler, Inhale 2 puffs into the lungs every 6 (six) hours as needed for wheezing or shortness of breath. .  fluticasone (FLONASE) 50 MCG/ACT nasal spray, Place 2 sprays into both nostrils daily.    Current Facility-Administered Medications (Hematological):  Marland Kitchen  [START ON 06/26/6008] folic acid (FOLVITE) tablet 1 mg  Current Outpatient Prescriptions (Hematological):  .  folic acid (FOLVITE) 1 MG tablet, Take 1 tablet (1 mg total) by mouth daily.  Current Facility-Administered Medications (Other):  .  lactulose (CHRONULAC) 10 GM/15ML solution 30 g .  [  START ON 07/16/2016] lactulose (CHRONULAC) 10 GM/15ML solution 30 g .  multivitamin tablet 1 tablet .  nicotine (NICODERM CQ - dosed in mg/24 hours) patch 21 mg .  ondansetron (ZOFRAN) tablet 8 mg .  [START ON 07/16/2016] pantoprazole (PROTONIX) EC tablet 40 mg .  [START ON 07/16/2016]  potassium chloride SA (K-DUR,KLOR-CON) CR tablet 20 mEq .  rifaximin (XIFAXAN) tablet 550 mg .  traZODone (DESYREL) tablet 100 mg  Current Outpatient Prescriptions (Other):  .  ondansetron (ZOFRAN) 8 MG tablet, Take 8 mg by mouth every 8 (eight) hours as needed for nausea or vomiting. .  traZODone (DESYREL) 100 MG tablet, Take 100 mg by mouth at bedtime as needed for sleep. Marland Kitchen  KLOR-CON M20 20 MEQ tablet, Take 1 tablet (20 mEq total) by mouth 2 (two) times daily. (Patient taking differently: Take 20 mEq by mouth daily. ) .  lactulose (CHRONULAC) 10 GM/15ML solution, Take 45 mLs (30 g total) by mouth 3 (three) times daily. .  Multiple Vitamin (MULTIVITAMIN) tablet, Take 1 tablet by mouth daily. .  nicotine (NICODERM CQ - DOSED IN MG/24 HOURS) 21 mg/24hr patch, Place 1 patch (21 mg total) onto the skin daily. Marland Kitchen  omeprazole (PRILOSEC) 20 MG capsule, Take 1 capsule (20 mg total) by mouth 2 (two) times daily before a meal. .  rifaximin (XIFAXAN) 550 MG TABS tablet, Take 1 tablet (550 mg total) by mouth 2 (two) times daily.  Physical Exam  BP 129/75 (BP Location: Right Arm)   Pulse (!) 56   Temp 98.5 F (36.9 C) (Oral)   Resp 18   Ht 5\' 6"  (1.676 m)   Wt 86.6 kg   SpO2 100%   BMI 30.83 kg/m   Physical Exam  Constitutional: He appears well-developed and well-nourished. No distress.  HENT:  Head: Normocephalic and atraumatic.  Mouth/Throat: Oropharynx is clear and moist.  Eyes: Conjunctivae are normal. Scleral icterus is present.  Neck: Normal range of motion. Neck supple.  Cardiovascular: Normal rate, regular rhythm, normal heart sounds and intact distal pulses.   Pulmonary/Chest: Effort normal and breath sounds normal. No respiratory distress.  Abdominal: Soft. There is no tenderness. There is no guarding.  Musculoskeletal: He exhibits no edema.  Lymphadenopathy:    He has no cervical adenopathy.  Neurological: He is alert.  Orientation: Patient seems slow to respond to commands.  There is a delay in his answers to questions. He knows who he is and where he is, but does not know the situation or why he was sent to the hospital. He incorrectly answers the date, day of the week, and month.   Strength: Patient's strength in the extremities is 4 out of 5 bilaterally.  Coordination: During the finger to nose coordination test, he started to following commands, but then when it came time to switch hands, he looked at me with a confused expression and asked me what I wanted him to do.   Gait: Patient's gait is heavy and shuffling.  Cognition: He could not think of common words like "pharmacy." When asked about his medications, he states, "I went to that place that has the medications. What is that called?"  Skin: Skin is warm and dry. He is not diaphoretic.  Psychiatric: He has a normal mood and affect. His behavior is normal.  Nursing note and vitals reviewed.   ED Course  Procedures   Results for orders placed or performed during the hospital encounter of 07/15/16  Urinalysis, Routine w reflex microscopic  Result  Value Ref Range   Color, Urine YELLOW YELLOW   APPearance CLEAR CLEAR   Specific Gravity, Urine 1.021 1.005 - 1.030   pH 6.0 5.0 - 8.0   Glucose, UA >=500 (A) NEGATIVE mg/dL   Hgb urine dipstick MODERATE (A) NEGATIVE   Bilirubin Urine NEGATIVE NEGATIVE   Ketones, ur NEGATIVE NEGATIVE mg/dL   Protein, ur NEGATIVE NEGATIVE mg/dL   Nitrite NEGATIVE NEGATIVE   Leukocytes, UA NEGATIVE NEGATIVE   RBC / HPF TOO NUMEROUS TO COUNT 0 - 5 RBC/hpf   WBC, UA 0-5 0 - 5 WBC/hpf   Bacteria, UA NONE SEEN NONE SEEN   Squamous Epithelial / LPF 0-5 (A) NONE SEEN  Comprehensive metabolic panel  Result Value Ref Range   Sodium 138 135 - 145 mmol/L   Potassium 3.5 3.5 - 5.1 mmol/L   Chloride 109 101 - 111 mmol/L   CO2 23 22 - 32 mmol/L   Glucose, Bld 70 65 - 99 mg/dL   BUN 13 6 - 20 mg/dL   Creatinine, Ser 1.18 0.61 - 1.24 mg/dL   Calcium 8.0 (L) 8.9 - 10.3 mg/dL    Total Protein 7.3 6.5 - 8.1 g/dL   Albumin 2.5 (L) 3.5 - 5.0 g/dL   AST 118 (H) 15 - 41 U/L   ALT 53 17 - 63 U/L   Alkaline Phosphatase 243 (H) 38 - 126 U/L   Total Bilirubin 1.6 (H) 0.3 - 1.2 mg/dL   GFR calc non Af Amer >60 >60 mL/min   GFR calc Af Amer >60 >60 mL/min   Anion gap 6 5 - 15  CBC  Result Value Ref Range   WBC 3.4 (L) 4.0 - 10.5 K/uL   RBC 3.76 (L) 4.22 - 5.81 MIL/uL   Hemoglobin 13.2 13.0 - 17.0 g/dL   HCT 37.4 (L) 39.0 - 52.0 %   MCV 99.5 78.0 - 100.0 fL   MCH 35.1 (H) 26.0 - 34.0 pg   MCHC 35.3 30.0 - 36.0 g/dL   RDW 14.5 11.5 - 15.5 %   Platelets 65 (L) 150 - 400 K/uL  Urine rapid drug screen (hosp performed)  Result Value Ref Range   Opiates NONE DETECTED NONE DETECTED   Cocaine NONE DETECTED NONE DETECTED   Benzodiazepines NONE DETECTED NONE DETECTED   Amphetamines NONE DETECTED NONE DETECTED   Tetrahydrocannabinol NONE DETECTED NONE DETECTED   Barbiturates NONE DETECTED NONE DETECTED  Ethanol  Result Value Ref Range   Alcohol, Ethyl (B) <5 <5 mg/dL  Lipase, blood  Result Value Ref Range   Lipase 63 (H) 11 - 51 U/L  Ammonia  Result Value Ref Range   Ammonia 77 (H) 9 - 35 umol/L  CBG monitoring, ED  Result Value Ref Range   Glucose-Capillary 236 (H) 65 - 99 mg/dL  I-stat troponin, ED  Result Value Ref Range   Troponin i, poc 0.01 0.00 - 0.08 ng/mL   Comment 3          CBG monitoring, ED  Result Value Ref Range   Glucose-Capillary 123 (H) 65 - 99 mg/dL   ALT  Date Value Ref Range Status  07/15/2016 53 17 - 63 U/L Final  06/08/2016 40 17 - 63 U/L Final  06/07/2016 43 17 - 63 U/L Final  06/06/2016 44 17 - 63 U/L Final  01/04/2016 52 0 - 55 U/L Final    AST  Date Value Ref Range Status  07/15/2016 118 (H) 15 - 41 U/L Final  06/08/2016 79 (  H) 15 - 41 U/L Final  06/07/2016 88 (H) 15 - 41 U/L Final  06/06/2016 93 (H) 15 - 41 U/L Final  01/04/2016 98 (H) 5 - 34 U/L Final     Dg Chest 2 View  Result Date: 07/15/2016 CLINICAL DATA:   Weakness EXAM: CHEST  2 VIEW COMPARISON:  1132 catheter is seen FINDINGS: Mild cardiac enlargement. Negative for heart failure. Lungs are clear without infiltrate effusion or mass. IMPRESSION: No active cardiopulmonary disease. Electronically Signed   By: Franchot Gallo M.D.   On: 07/15/2016 14:16   Ct Head Wo Contrast  Result Date: 07/15/2016 CLINICAL DATA:  Acute onset of altered mental status and nausea. Initial encounter. EXAM: CT HEAD WITHOUT CONTRAST TECHNIQUE: Contiguous axial images were obtained from the base of the skull through the vertex without intravenous contrast. COMPARISON:  CT of the head performed 05/10/2016 FINDINGS: Brain: No evidence of acute infarction, hemorrhage, hydrocephalus, extra-axial collection or mass lesion/mass effect. Mild subcortical white matter change likely reflects small vessel ischemic microangiopathy. Mild cerebellar atrophy is noted. The brainstem and fourth ventricle are within normal limits. The basal ganglia are unremarkable in appearance. The cerebral hemispheres demonstrate grossly normal gray-white differentiation. No mass effect or midline shift is seen. Vascular: No hyperdense vessel or unexpected calcification. Skull: There is no evidence of fracture; there is chronic deformity of the medial walls of the orbits bilaterally. Sinuses/Orbits: The visualized portions of the orbits are otherwise within normal limits. The paranasal sinuses and mastoid air cells are well-aerated. Other: No significant soft tissue abnormalities are seen. IMPRESSION: 1. No acute intracranial pathology seen on CT. 2. Mild small vessel ischemic microangiopathy. Electronically Signed   By: Garald Balding M.D.   On: 07/15/2016 18:32    EKG Interpretation  Date/Time:  Tuesday July 15 2016 14:46:54 EDT Ventricular Rate:  68 PR Interval:    QRS Duration: 100 QT Interval:  491 QTC Calculation: 523 R Axis:   54 Text Interpretation:  Sinus rhythm Probable anterior infarct, old  Prolonged QT interval Confirmed by Alvino Chapel  MD, Ovid Curd [phone]) on 07/15/2016 3:22:24 PM       MDM  Patient care taken over from Heath Lark, PA-C.   Patient presents with reported confusion and gait abnormality. Upon my initial assessment, patient is somewhat slow to answer and has some confusion.   4:32 PM Spoke with Dr. Vista Lawman, who states that he sent the patient to the ED because he was concerned about an unsteady gait and some confusion. He states the patient is usually A&Ox4, speaks clearly, and is fully functioning and able to care for himself without a problem.  Patient has elevated liver enzymes above what appears to be his baseline. Elevated ammonia. No acute abnormalities on head CT. Admission for hepatic encephalopathy.  7:40 PM Spoke with Dr. Alcario Drought, hospitalist, who agreed to admit the patient.  Findings and plan of care discussed with Charlesetta Shanks, MD. Dr. Johnney Killian personally evaluated and examined this patient.    Vitals:   07/15/16 1305 07/15/16 1525 07/15/16 1722 07/15/16 1931  BP: 129/75 137/88 131/74 118/84  Pulse: (!) 56 76 (!) 55 (!) 52  Resp: 18 (!) 22 18 12   Temp: 98.5 F (36.9 C)     TempSrc: Oral     SpO2: 100% 100% 100% 100%  Weight: 86.6 kg     Height: 5\' 6"  (1.676 m)           Lorayne Bender, PA-C 07/15/16 1944    Charlesetta Shanks, MD  07/26/16 1334  

## 2016-07-15 NOTE — ED Notes (Signed)
PT REQUESTED SOME FOOD AND DRINK WHEN ORDER FOR FLUID CHALLENGE WAS PLACED. PT WAS GIVEN A [employer] NOODLE SOUP, 2 PACKS OF GRAHAM CRACKERS, 2 PACKS OF PEANUT BUTTER AND A SPRITE ZERO. PT DENIED A HAM OR [place] SANDWICH.

## 2016-07-15 NOTE — ED Notes (Signed)
Patient given sandwich and sprite 

## 2016-07-15 NOTE — Clinical Social Work Note (Signed)
Clinical Social Work Assessment  Patient Details  Name: Jose [name] MRN: [id number] Date of Birth: March 10, 1956  Date of referral:  07/15/16               Reason for consult:  Facility Placement                Permission sought to share information with:  Facility Art therapist granted to share information::  Yes, Verbal Permission Granted  Name::        Agency::     Relationship::     Contact Information:     Housing/Transportation Living arrangements for the past 2 months:  Group Home Source of Information:  Patient Patient Interpreter Needed:  None Criminal Activity/Legal Involvement Pertinent to Current Situation/Hospitalization:    Significant Relationships:  Other(Comment) (Pt's group home manager Jennelle Human) Lives with:  Facility Resident Do you feel safe going back to the place where you live?  Yes Need for family participation in patient care:  No (Coment)  Care giving concerns:  None listed by pt/family   Social Worker assessment / plan:  CSW met with pt and confirmed pt's plan to be discharged back to [facility] to live at discharge.  CSW provided active listening and validated pt's concerns for housing.  CSW educated pt CSW in ED does not assist in additional housing options if pt has a residence.  Pt has been living at group home for one month prior to being admitted to [facility].  Per pt, pt lives in [facility] at [place].  Per pt, pt is also one month into a six-month program at [facility] located at [address], [place], [place] [address].  Pt reports he attends classes every day from 9am-1pn and then returns to his group home for the rest of the day.  CSW called Ph: [phone] and [phone] but reached an in-box with no VM set up.  Pt does not know or possess his home number but states the group home manager's name is Jennelle Human.  Pt's sister is Laural Roes at  [phone] or [phone] and pt's Godmother is [phone].  CSW rquested CSW not call his relatives until 3/21.  CSW complied with request.  Pt is being admitted.  CSW obtained signed ROI from pt to speak with [facility] has placed ROI in pt's chart in ED and has requested pt's RN send ROI up to inpatient when pt is take to his room in medical.       Employment status:  Disabled (Comment on whether or not currently receiving Disability) Insurance information:  [place]icaid [place] PT Recommendations:  Not assessed at this time Information / Referral to community resources:     Patient/Family's Response to care:  Patient alert and oriented, although at times presents with fluctuating orientation.  Patient agreeable to plan.  Pt reports his sister and God-mother supportive and strongly involved in pt.'s care.  Pt  pleasant and appreciated CSW intervention.     Patient/Family's Understanding of and Emotional Response to Diagnosis, Current Treatment, and Prognosis:  Still assessing   Emotional Assessment Appearance:  Appears stated age Attitude/Demeanor/Rapport:    Affect (typically observed):    Orientation:  Fluctuating Orientation (Suspected and/or reported Sundowners) Alcohol / Substance use:    Psych involvement (Current and /or in the community):     Discharge Needs  Concerns to be addressed:  No discharge needs identified Readmission within the last 30 days:  No Current discharge risk:  None Barriers to Discharge:  No Barriers Identified   Claudine Mouton, LCSWA 07/15/2016, 10:46 PM

## 2016-07-15 NOTE — ED Provider Notes (Signed)
[facility] DEPT Provider Note   CSN: [id number] Arrival date & time: 07/15/16  1244     History   Chief Complaint Chief Complaint  Patient presents with  . Hyperglycemia    HPI Jose [name] is a 61 y.o. male with PMHx of hepatic encephalopathy, hepatitis C, cirrhosis, ascites, thrombocytopenia, hemochromatosis, alcohol abuse, IDDM, recently seen in ED for acute encephalopathy, presents today by EMS from [facility] where is CBG was 394 presents today with complaints of nausea and trouble walking since last night. He denies changes in bowel movements, changes in urinary symptoms. He denies chest pain and shortness of breath, vomiting, fevers, abdominal pain. He denies bleeding or bruising.  He states nothing makes his symptoms better or worse. He reports taking all his medications as prescribed. He states the last dose of medications he took was yesterday morning. He reports not drinking alcohol in over "2-4 weeks." He admits to history of crack cocaine and going to drugs anonymous classes. He states he has not used in over a month and half. He has asked several times for food. He reports not having had any food since yesterday.     Per EMR: Pt lives in Group home. Recurrent admissions to hospital for AMS. He has chronic right lower abdominal pain from abdominal hernia but not surgically repaired due to chronic thrombocytopenia. In may  He had extensive workup including liver biopsy which excluded hepatoma. Starting September 2017 he had regular phlebotomy. Starting March 2018 frequency of phlebotomy reduced to every 3 months due to reduced ferritin level.    The history is provided by the patient. No language interpreter was used.    Past Medical History:  Diagnosis Date  . Alcohol abuse   . Alcohol dependence ([place])   . Alcoholism ([place]) 07/12/2015  . Ascites   . Cirrhosis ([place])   . Depression   . Hepatitis C   . Hypertension   . QT prolongation   . Shortness of breath    with  exertion  . Thrombocytopenia [facility])     Patient Active Problem List   Diagnosis Date Noted  . Hepatic encephalopathy ([place]) 06/06/2016  . Alcoholic cirrhosis of liver with ascites ([place]) 06/06/2016  . Chronic hepatitis C without hepatic coma ([place]) 06/06/2016  . CKD, stage III 06/06/2016  . Thrombocytopenia ([place]) 06/06/2016  . Elevated lactic acid level 05/10/2016  . Acute hepatic encephalopathy 04/26/2016  . 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 Hepatitis C, chronic ([place]) 01/04/2016  . Hernia, inguinal, right 09/12/2015  . Essential hypertension 09/12/2015  . Gastroesophageal reflux disease without esophagitis 09/12/2015  . Protein-calorie malnutrition, severe ([place]) 09/12/2015  . Anemia of chronic disease 09/12/2015  . Arthritis of left knee 09/12/2015  . Cirrhosis of liver with ascites ([place])   . Elevated LFTs   . AKI (acute kidney injury) ([place]) 09/03/2015  . Weakness 09/03/2015  . Generalized weakness   . Physical deconditioning   . Acute-on-chronic renal failure ([place])   . Alcoholic cirrhosis of liver with ascites ([place]) 07/25/2015  . Acute renal failure superimposed on stage 4 chronic kidney disease ([place]) 07/25/2015  . Hypokalemia 07/23/2015  . Macrocytic anemia 07/23/2015  . Alcoholism ([place]) 07/12/2015  . ARF (acute renal failure) ([place]) 06/27/2015  . Metabolic encephalopathy 82/50/5397  . Hepatic encephalopathy ([place]) 06/26/2015  . Trichomonas infection 06/26/2015  . 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 Adenomatous polyps 10/18/2013  . Unspecified vitamin D deficiency 07/13/2013  . Atopic dermatitis 04/28/2013  . Thrombocytopenia ([place]) 04/28/2013  . Anasarca  04/26/2013  . UTI (urinary tract infection) 04/26/2013    Past Surgical History:  Procedure Laterality Date  . CIRCUMCISION    . COLONOSCOPY  march 2015   Dr. Renee Harder: nodular mucosa at appendiceal orifice, tubular adenoma, extremely poor prep  . COLONOSCOPY         Home Medications    Prior to Admission  medications   Medication Sig Start Date End Date Taking? Authorizing Provider  folic acid (FOLVITE) 1 MG tablet Take 1 tablet (1 mg total) by mouth daily. 07/29/15   Barton Dubois, MD  folic acid (FOLVITE) 1 MG tablet Take 1 tablet (1 mg total) by mouth daily. 06/09/16   Shanker Kristeen Mans, MD  furosemide (LASIX) 40 MG tablet Take 1 tablet (40 mg total) by mouth 2 (two) times daily. 04/28/16   Annita Brod, MD  furosemide (LASIX) 40 MG tablet Take 1 tablet (40 mg total) by mouth daily. 06/09/16   Shanker Kristeen Mans, MD  KLOR-CON M20 20 MEQ tablet Take 1 tablet (20 mEq total) by mouth 2 (two) times daily. 04/28/16   Annita Brod, MD  lactulose (CHRONULAC) 10 GM/15ML solution Take 30 mLs (20 g total) by mouth 3 (three) times daily. 05/11/16   Nishant Dhungel, MD  lactulose (CHRONULAC) 10 GM/15ML solution Take 45 mLs (30 g total) by mouth 3 (three) times daily. 06/09/16   Shanker Kristeen Mans, MD  magnesium oxide (MAG-OX) 400 (241.3 Mg) MG tablet Take 1 tablet (400 mg total) by mouth daily. 09/05/15   Reyne Dumas, MD  methocarbamol (ROBAXIN) 500 MG tablet Take 1 tablet (500 mg total) by mouth 2 (two) times daily. Patient not taking: Reported on 04/26/2016 12/18/15   Lacretia Leigh, MD  Multiple Vitamin (MULTIVITAMIN) tablet Take 1 tablet by mouth daily.    Historical Provider, MD  nicotine (NICODERM CQ - DOSED IN MG/24 HOURS) 21 mg/24hr patch Place 1 patch (21 mg total) onto the skin daily. 05/11/16   Nishant Dhungel, MD  omeprazole (PRILOSEC) 20 MG capsule Take 1 capsule (20 mg total) by mouth daily. 04/28/16   Annita Brod, MD  omeprazole (PRILOSEC) 20 MG capsule Take 1 capsule (20 mg total) by mouth 2 (two) times daily before a meal. 06/09/16   Shanker Kristeen Mans, MD  ondansetron (ZOFRAN) 4 MG tablet Take 1 tablet (4 mg total) by mouth every 6 (six) hours. 05/20/16   [name], PA-C  potassium chloride SA (K-DUR,KLOR-CON) 20 MEQ tablet Take 1 tablet (20 mEq total) by mouth daily. 06/09/16   Shanker Kristeen Mans, MD  rifaximin (XIFAXAN) 550 MG TABS tablet Take 1 tablet (550 mg total) by mouth 2 (two) times daily. 05/11/16   Nishant Dhungel, MD  rifaximin (XIFAXAN) 550 MG TABS tablet Take 1 tablet (550 mg total) by mouth 2 (two) times daily. 06/09/16   Shanker Kristeen Mans, MD  spironolactone (ALDACTONE) 100 MG tablet Take 0.5 tablets (50 mg total) by mouth daily. 04/28/16   Annita Brod, MD  spironolactone (ALDACTONE) 100 MG tablet Take 2 tablets (200 mg total) by mouth daily. 06/09/16   Shanker Kristeen Mans, MD    Family History Family History  Problem Relation Age of Onset  . Breast cancer Other   . Diabetes Other   . Hypertension Other   . Breast cancer Mother   . Hypertension Mother   . Diabetes Father   . Hypertension Sister   . Heart disease Sister   . Hypertension Paternal Grandmother   . Colon cancer  Neg Hx     Social History Social History  Substance Use Topics  . Smoking status: Unknown If Ever Smoked  . Smokeless tobacco: Never Used  . Alcohol use Yes     Comment: A couple beers every few days      Allergies   Patient has no known allergies.   Review of Systems Review of Systems  Constitutional: Negative for fever.  Respiratory: Negative for shortness of breath.   Cardiovascular: Negative for chest pain.  Gastrointestinal: Positive for nausea. Negative for abdominal pain, diarrhea and vomiting.  Genitourinary: Negative for difficulty urinating and dysuria.  Neurological: Positive for weakness. Negative for numbness.  All other systems reviewed and are negative.    Physical Exam Updated Vital Signs BP 137/88 (BP Location: Right Arm)   Pulse 76   Temp 98.5 F (36.9 C) (Oral)   Resp (!) 22   Ht 5\' 6"  (1.676 m)   Wt 86.6 kg   SpO2 100%   BMI 30.83 kg/m   Physical Exam  Constitutional: He is oriented to person, place, and time. He appears well-developed and well-nourished.  uncomfortable  HENT:  Head: Normocephalic and atraumatic.  Nose: Nose normal.    Mouth/Throat: Oropharynx is clear and moist.  Eyes: EOM are normal. Pupils are equal, round, and reactive to light. Scleral icterus is present.  Neck: Normal range of motion.  Cardiovascular: Normal rate, normal heart sounds and intact distal pulses.   No murmur heard. Intact distal pulses in all 4 extremities.   Pulmonary/Chest: Effort normal and breath sounds normal. No respiratory distress. He has no wheezes.  Normal work of breathing. No respiratory distress noted.   Abdominal: Soft. He exhibits distension. There is no tenderness. There is no guarding. A hernia (Known, chronic) is present.  Musculoskeletal: Normal range of motion. He exhibits no tenderness.  Neurological: He is alert and oriented to person, place, and time.  Cranial Nerves:  III,IV, VI: ptosis not present, extra-ocular movements intact bilaterally, direct and consensual pupillary light reflexes intact bilaterally V: facial sensation, jaw opening, and bite strength equal bilaterally VII: eyebrow raise, eyelid close, smile, frown, pucker equal bilaterally VIII: hearing grossly normal bilaterally  IX,X: palate elevation and swallowing intact XI: bilateral shoulder shrug and lateral head rotation equal and strong XII: midline tongue extension  Negative pronator drift, negative Romberg, negative RAM's, negative heel-to-shin, negative finger to nose.    Sensory intact.  Muscle strength 5/5 Patient able to ambulate without difficulty.   Skin: Skin is warm. Rash noted.  Lower leg rash.   Psychiatric: He has a normal mood and affect. His behavior is normal.  Nursing note and vitals reviewed.    ED Treatments / Results  Labs (all labs ordered are listed, but only abnormal results are displayed) Labs Reviewed  URINALYSIS, ROUTINE W REFLEX MICROSCOPIC - Abnormal; Notable for the following:       Result Value   Glucose, UA >=500 (*)    Hgb urine dipstick MODERATE (*)    Squamous Epithelial / LPF 0-5 (*)    All other  components within normal limits  COMPREHENSIVE METABOLIC PANEL - Abnormal; Notable for the following:    Calcium 8.0 (*)    Albumin 2.5 (*)    AST 118 (*)    Alkaline Phosphatase 243 (*)    Total Bilirubin 1.6 (*)    All other components within normal limits  CBC - Abnormal; Notable for the following:    WBC 3.4 (*)    RBC  3.76 (*)    HCT 37.4 (*)    MCH 35.1 (*)    Platelets 65 (*)    All other components within normal limits  CBG MONITORING, ED - Abnormal; Notable for the following:    Glucose-Capillary 236 (*)    All other components within normal limits  I-STAT TROPOININ, ED    EKG  EKG Interpretation  Date/Time:  Tuesday July 15 2016 14:46:54 EDT Ventricular Rate:  68 PR Interval:    QRS Duration: 100 QT Interval:  491 QTC Calculation: 523 R Axis:   54 Text Interpretation:  Sinus rhythm Probable anterior infarct, old Prolonged QT interval Confirmed by Alvino Chapel  MD, NATHAN [phone]) on 07/15/2016 3:22:24 PM       Radiology Dg Chest 2 View  Result Date: 07/15/2016 CLINICAL DATA:  Weakness EXAM: CHEST  2 VIEW COMPARISON:  1132 catheter is seen FINDINGS: Mild cardiac enlargement. Negative for heart failure. Lungs are clear without infiltrate effusion or mass. IMPRESSION: No active cardiopulmonary disease. Electronically Signed   By: Franchot Gallo M.D.   On: 07/15/2016 14:16    Procedures Procedures (including critical care time)  Medications Ordered in ED Medications  ondansetron (ZOFRAN-ODT) disintegrating tablet 4 mg (not administered)  sodium chloride 0.9 % bolus 1,000 mL (not administered)  sodium chloride 0.9 % bolus 1,000 mL (not administered)  sodium chloride 0.9 % bolus 1,000 mL (1,000 mLs Intravenous New Bag/Given 07/15/16 1433)     Initial Impression / Assessment and Plan / ED Course  I have reviewed the triage vital signs and the nursing notes.  Pertinent labs & imaging results that were available during my care of the patient were reviewed by me  and considered in my medical decision making (see chart for details).    Pt is a 61yo male presenting by EMS sent from PCP.  On exam, pt in NAD. Nontoxic/nonseptic appearing. Hemodynamically stable. Afebrile. Lungs CTA. Heart RRR. Abdomen nontender and distended. POC Glucose at PCP office 394. EMS reportedly gave pt 15 units of insulin at PCP. CBG with EMS 359 and CBG here in Ed 236. CBC similar to previous. Potassium 3.5. Gap 6 shows positive glucose and moderate Hgb. Given NS bolus 2 L in ED. He is neurologically intact with poor effort on exam. Cerebellar intact. Able to stand and ambulate.  Pt shown to have scleral icterus, which is likely not new due to his history and previous elevated bilirubin. Pt does have elevated LFT's, however he is does not complain of RUQ pain and is nontender to palpation to RUQ. He also reports not taking his daily medications since yesterday morning. Patient has a chronic hernia that is not able to be operated on due to his chronic thrombocytopenia.  Pt continuously asking for food here. Plan is to reevaluate after 2nd liter of fluid and food intake. Patient is in no acute distress. Hemodynamically stable. Patient is able to ambulate. Patient able to tolerate PO.   Pt seen and evaluated by Dr. Alvino Chapel who agrees with assessment and plan.   At shift change care was transferred to Georga Kaufmann, PA-C who will follow pending studies, re-evaulate and determine disposition.    Final Clinical Impressions(s) / ED Diagnoses   Final diagnoses:  None    New Prescriptions New Prescriptions   No medications on file     [place], [place] 07/15/16 [place], [place] 07/15/16 1627    Davonna Belling, MD 07/16/16 1049

## 2016-07-15 NOTE — Progress Notes (Addendum)
Consult request has been received. CSW following up at present time. CSW met with pt.  Per pt, pt lives in [facility] [place].  Per pt, pt is also one month into a six-month program at [facility] located at [address], [place], [place] [address].  Pt reports he attends classes every day from 9am-1pn and then returns to his group home for the rest of the day.  CSW called Ph: [phone] and [phone] but reached an in-box with no VM set up.  Pt does not know or possess his home number but states the group home manager's name is Jennelle Human.  Pt's sister is Laural Roes at [phone] or [phone] and pt's Godmother is [phone].     Alphonse Guild. Avari Nevares, Latanya Presser, LCAS Clinical Social Worker Ph: [phone]

## 2016-07-15 NOTE — H&P (Signed)
History and Physical    Jose [name] WUJ:[id number] DOB: 09-15-1955 DOA: 07/15/2016  PCP: Carmie Kanner, NP  Patient coming from: Dr. Matthias Hughs office, is currently residing at a drug rehab facility called [name]"  I have personally briefly reviewed patient's old medical records in [place]  Chief Complaint: Confusion  HPI: Jose [name] is a 61 y.o. male with medical history significant of EtOH cirrhosis, recurrent admits for hepatic encephalopathy, EtOH abuse currently in drug rehab.  Patient was seen in Dr. Matthias Hughs office today, found to have likely new onset DM2 with CBG of 394 (treated with 15 units of insulin), but was also noted to be confused with unsteady gait.  Due to concern for this patient was sent in to the ED.  Patient was last admitted to our service for hepatic encephalopathy just over a month ago in Feb.  Also admitted in Jan and Dec for the same.  It turns out that the patient ran out of his lactulose "a couple of days ago" he states.  Last drug use has been over "a month and a half at least".  No EtOH in over "2-4 weeks".  ED Course: His ammonia level today is elevated at 77.  LFTs slightly elevated but are so at baseline.  Platelets also baseline low at 65.  CT head is negative.   Review of Systems: As per HPI otherwise 10 point review of systems negative.   Past Medical History:  Diagnosis Date  . Alcohol abuse   . Alcohol dependence ([place])   . Alcoholism ([place]) 07/12/2015  . Ascites   . Cirrhosis ([place])   . Depression   . Hepatitis C   . Hypertension   . QT prolongation   . Shortness of breath    with exertion  . Thrombocytopenia ([place])     Past Surgical History:  Procedure Laterality Date  . CIRCUMCISION    . COLONOSCOPY  march 2015   Dr. Renee Harder: nodular mucosa at appendiceal orifice, tubular adenoma, extremely poor prep  . COLONOSCOPY       has an unknown smoking status. He has never used smokeless tobacco. He reports  that he drinks alcohol. He reports that he uses drugs, including Marijuana and Cocaine.  No Known Allergies  Family History  Problem Relation Age of Onset  . Breast cancer Other   . Diabetes Other   . Hypertension Other   . Breast cancer Mother   . Hypertension Mother   . Diabetes Father   . Hypertension Sister   . Heart disease Sister   . Hypertension Paternal Grandmother   . Colon cancer Neg Hx      Prior to Admission medications   Medication Sig Start Date End Date Taking? Authorizing Provider  albuterol (PROVENTIL HFA;VENTOLIN HFA) 108 (90 Base) MCG/ACT inhaler Inhale 2 puffs into the lungs every 6 (six) hours as needed for wheezing or shortness of breath.   Yes Historical Provider, MD  fluticasone (FLONASE) 50 MCG/ACT nasal spray Place 2 sprays into both nostrils daily.   Yes Historical Provider, MD  ondansetron (ZOFRAN) 8 MG tablet Take 8 mg by mouth every 8 (eight) hours as needed for nausea or vomiting.   Yes Historical Provider, MD  traZODone (DESYREL) 100 MG tablet Take 100 mg by mouth at bedtime as needed for sleep.   Yes Historical Provider, MD  folic acid (FOLVITE) 1 MG tablet Take 1 tablet (1 mg total) by mouth daily. 06/09/16   Shanker Jerilynn Mages  Ghimire, MD  furosemide (LASIX) 40 MG tablet Take 1 tablet (40 mg total) by mouth daily. 06/09/16   Shanker Kristeen Mans, MD  KLOR-CON M20 20 MEQ tablet Take 1 tablet (20 mEq total) by mouth 2 (two) times daily. Patient taking differently: Take 20 mEq by mouth daily.  04/28/16   Annita Brod, MD  lactulose (CHRONULAC) 10 GM/15ML solution Take 45 mLs (30 g total) by mouth 3 (three) times daily. 06/09/16   Shanker Kristeen Mans, MD  Multiple Vitamin (MULTIVITAMIN) tablet Take 1 tablet by mouth daily.    Historical Provider, MD  nicotine (NICODERM CQ - DOSED IN MG/24 HOURS) 21 mg/24hr patch Place 1 patch (21 mg total) onto the skin daily. 05/11/16   Nishant Dhungel, MD  omeprazole (PRILOSEC) 20 MG capsule Take 1 capsule (20 mg total) by mouth 2  (two) times daily before a meal. 06/09/16   Shanker Kristeen Mans, MD  rifaximin (XIFAXAN) 550 MG TABS tablet Take 1 tablet (550 mg total) by mouth 2 (two) times daily. 06/09/16   Shanker Kristeen Mans, MD  spironolactone (ALDACTONE) 100 MG tablet Take 2 tablets (200 mg total) by mouth daily. 06/09/16   Jonetta Osgood, MD    Physical Exam: Vitals:   07/15/16 1305 07/15/16 1525 07/15/16 1722 07/15/16 1931  BP: 129/75 137/88 131/74 118/84  Pulse: (!) 56 76 (!) 55 (!) 52  Resp: 18 (!) 22 18 12   Temp: 98.5 F (36.9 C)     TempSrc: Oral     SpO2: 100% 100% 100% 100%  Weight: 86.6 kg (191 lb)     Height: 5\' 6"  (1.676 m)       Constitutional: NAD, calm, comfortable Eyes: PERRL, lids and conjunctivae normal ENMT: Mucous membranes are moist. Posterior pharynx clear of any exudate or lesions.Normal dentition.  Neck: normal, supple, no masses, no thyromegaly Respiratory: clear to auscultation bilaterally, no wheezing, no crackles. Normal respiratory effort. No accessory muscle use.  Cardiovascular: Regular rate and rhythm, no murmurs / rubs / gallops. No extremity edema. 2+ pedal pulses. No carotid bruits.  Abdomen: no tenderness, no masses palpated. No hepatosplenomegaly. Bowel sounds positive.  Musculoskeletal: no clubbing / cyanosis. No joint deformity upper and lower extremities. Good ROM, no contractures. Normal muscle tone.  Skin: no rashes, lesions, ulcers. No induration Neurologic: CN 2-12 grossly intact. Sensation intact, DTR normal. Strength 5/5 in all 4.  Psychiatric: Normal judgment and insight. Alert and oriented x 3. Normal mood.    Labs on Admission: I have personally reviewed following labs and imaging studies  CBC:  Recent Labs Lab 07/15/16 1429  WBC 3.4*  HGB 13.2  HCT 37.4*  MCV 99.5  PLT 65*   Basic Metabolic Panel:  Recent Labs Lab 07/15/16 1429  NA 138  K 3.5  CL 109  CO2 23  GLUCOSE 70  BUN 13  CREATININE 1.18  CALCIUM 8.0*   GFR: Estimated Creatinine  Clearance: 68.6 mL/min (by C-G formula based on SCr of 1.18 mg/dL). Liver Function Tests:  Recent Labs Lab 07/15/16 1429  AST 118*  ALT 53  ALKPHOS 243*  BILITOT 1.6*  PROT 7.3  ALBUMIN 2.5*    Recent Labs Lab 07/15/16 1429  LIPASE 63*    Recent Labs Lab 07/15/16 1725  AMMONIA 77*   Coagulation Profile: No results for input(s): INR, PROTIME in the last 168 hours. Cardiac Enzymes: No results for input(s): CKTOTAL, CKMB, CKMBINDEX, TROPONINI in the last 168 hours. BNP (last 3 results) No results for input(s):  PROBNP in the last 8760 hours. HbA1C: No results for input(s): HGBA1C in the last 72 hours. CBG:  Recent Labs Lab 07/15/16 1308 07/15/16 1936  GLUCAP 236* 123*   Lipid Profile: No results for input(s): CHOL, HDL, LDLCALC, TRIG, CHOLHDL, LDLDIRECT in the last 72 hours. Thyroid Function Tests: No results for input(s): TSH, T4TOTAL, FREET4, T3FREE, THYROIDAB in the last 72 hours. Anemia Panel: No results for input(s): VITAMINB12, FOLATE, FERRITIN, TIBC, IRON, RETICCTPCT in the last 72 hours. Urine analysis:    Component Value Date/Time   COLORURINE YELLOW 07/15/2016 1403   APPEARANCEUR CLEAR 07/15/2016 1403   LABSPEC 1.021 07/15/2016 1403   PHURINE 6.0 07/15/2016 1403   GLUCOSEU >=500 (A) 07/15/2016 1403   HGBUR MODERATE (A) 07/15/2016 1403   BILIRUBINUR NEGATIVE 07/15/2016 1403   KETONESUR NEGATIVE 07/15/2016 1403   PROTEINUR NEGATIVE 07/15/2016 1403   UROBILINOGEN 4.0 (H) 04/26/2013 0500   NITRITE NEGATIVE 07/15/2016 1403   LEUKOCYTESUR NEGATIVE 07/15/2016 1403    Radiological Exams on Admission: Dg Chest 2 View  Result Date: 07/15/2016 CLINICAL DATA:  Weakness EXAM: CHEST  2 VIEW COMPARISON:  1132 catheter is seen FINDINGS: Mild cardiac enlargement. Negative for heart failure. Lungs are clear without infiltrate effusion or mass. IMPRESSION: No active cardiopulmonary disease. Electronically Signed   By: Franchot Gallo M.D.   On: 07/15/2016 14:16    Ct Head Wo Contrast  Result Date: 07/15/2016 CLINICAL DATA:  Acute onset of altered mental status and nausea. Initial encounter. EXAM: CT HEAD WITHOUT CONTRAST TECHNIQUE: Contiguous axial images were obtained from the base of the skull through the vertex without intravenous contrast. COMPARISON:  CT of the head performed 05/10/2016 FINDINGS: Brain: No evidence of acute infarction, hemorrhage, hydrocephalus, extra-axial collection or mass lesion/mass effect. Mild subcortical white matter change likely reflects small vessel ischemic microangiopathy. Mild cerebellar atrophy is noted. The brainstem and fourth ventricle are within normal limits. The basal ganglia are unremarkable in appearance. The cerebral hemispheres demonstrate grossly normal gray-white differentiation. No mass effect or midline shift is seen. Vascular: No hyperdense vessel or unexpected calcification. Skull: There is no evidence of fracture; there is chronic deformity of the medial walls of the orbits bilaterally. Sinuses/Orbits: The visualized portions of the orbits are otherwise within normal limits. The paranasal sinuses and mastoid air cells are well-aerated. Other: No significant soft tissue abnormalities are seen. IMPRESSION: 1. No acute intracranial pathology seen on CT. 2. Mild small vessel ischemic microangiopathy. Electronically Signed   By: Garald Balding M.D.   On: 07/15/2016 18:32    EKG: Independently reviewed.  Assessment/Plan Principal Problem:   Acute hepatic encephalopathy Active Problems:   Thrombocytopenia ([facility])   Alcoholic cirrhosis of liver with ascites ([facility])   Essential hypertension   Hemochromatosis   Hepatitis C, chronic ([place])   New onset type 2 diabetes mellitus ([place])    1. Acute hepatic encephalopathy - 1. Resume home meds including lactulose and rifaxan 2. Will observe overnight 2. New onset DM2 - based on report of CBG 394 in Dr. Matthias Hughs office today 1. Diabetic coordinator consult -  probably should be on PO hypoglycemics on discharge 2. SSI AC sensitive scale for now 3. Thrombocytopenia - chronic, stable, baseline 4. HTN - continue home meds 5. EtOH cirrhosis with chronic HCV - 1. Low sodium diet 2. Continue diuretics: lasix and spironolactone that he was discharged on from admission last month 3. Stop Dyazide (which was [facility] on discharge paperwork from last month's admission). 6. Hemochromatosis - gets therapeutic phlebotomy which  will now be done every 3 months, looks like this was just done on 3/12 with Dr. Alvy Bimler, so should be due for next one in June   DVT prophylaxis: SCDs due to thrombocytopenia Code Status: Full Family Communication: No family in room Disposition Plan: Back to [name] drug rehab facility after discharge Consults called: None Admission status: Place in obs   Dorthey Depace, [place] [facility] Pager [phone]  If 7AM-7PM, please contact day team taking care of patient [email or web address] Password [email or web address]  07/15/2016, 8:00 PM

## 2016-07-16 DIAGNOSIS — B182 Chronic viral hepatitis C: Secondary | ICD-10-CM | POA: Diagnosis present

## 2016-07-16 DIAGNOSIS — Z7951 Long term (current) use of inhaled steroids: Secondary | ICD-10-CM | POA: Diagnosis not present

## 2016-07-16 DIAGNOSIS — K7290 Hepatic failure, unspecified without coma: Secondary | ICD-10-CM | POA: Diagnosis not present

## 2016-07-16 DIAGNOSIS — R739 Hyperglycemia, unspecified: Secondary | ICD-10-CM | POA: Diagnosis not present

## 2016-07-16 DIAGNOSIS — D696 Thrombocytopenia, unspecified: Secondary | ICD-10-CM | POA: Diagnosis present

## 2016-07-16 DIAGNOSIS — E1165 Type 2 diabetes mellitus with hyperglycemia: Secondary | ICD-10-CM | POA: Diagnosis present

## 2016-07-16 DIAGNOSIS — K7200 Acute and subacute hepatic failure without coma: Secondary | ICD-10-CM | POA: Diagnosis not present

## 2016-07-16 DIAGNOSIS — Y900 Blood alcohol level of less than 20 mg/100 ml: Secondary | ICD-10-CM | POA: Diagnosis present

## 2016-07-16 DIAGNOSIS — E876 Hypokalemia: Secondary | ICD-10-CM | POA: Diagnosis not present

## 2016-07-16 DIAGNOSIS — K469 Unspecified abdominal hernia without obstruction or gangrene: Secondary | ICD-10-CM | POA: Diagnosis present

## 2016-07-16 DIAGNOSIS — Z79899 Other long term (current) drug therapy: Secondary | ICD-10-CM | POA: Diagnosis not present

## 2016-07-16 DIAGNOSIS — E119 Type 2 diabetes mellitus without complications: Secondary | ICD-10-CM | POA: Diagnosis not present

## 2016-07-16 DIAGNOSIS — Z9119 Patient's noncompliance with other medical treatment and regimen: Secondary | ICD-10-CM | POA: Diagnosis not present

## 2016-07-16 DIAGNOSIS — R410 Disorientation, unspecified: Secondary | ICD-10-CM | POA: Diagnosis present

## 2016-07-16 DIAGNOSIS — Z833 Family history of diabetes mellitus: Secondary | ICD-10-CM | POA: Diagnosis not present

## 2016-07-16 DIAGNOSIS — I10 Essential (primary) hypertension: Secondary | ICD-10-CM | POA: Diagnosis present

## 2016-07-16 DIAGNOSIS — K7031 Alcoholic cirrhosis of liver with ascites: Secondary | ICD-10-CM | POA: Diagnosis present

## 2016-07-16 DIAGNOSIS — F329 Major depressive disorder, single episode, unspecified: Secondary | ICD-10-CM | POA: Diagnosis present

## 2016-07-16 DIAGNOSIS — Z8249 Family history of ischemic heart disease and other diseases of the circulatory system: Secondary | ICD-10-CM | POA: Diagnosis not present

## 2016-07-16 DIAGNOSIS — B1920 Unspecified viral hepatitis C without hepatic coma: Secondary | ICD-10-CM | POA: Diagnosis present

## 2016-07-16 DIAGNOSIS — F1020 Alcohol dependence, uncomplicated: Secondary | ICD-10-CM | POA: Diagnosis present

## 2016-07-16 DIAGNOSIS — K7040 Alcoholic hepatic failure without coma: Secondary | ICD-10-CM | POA: Diagnosis not present

## 2016-07-16 LAB — GLUCOSE, CAPILLARY
GLUCOSE-CAPILLARY: 100 mg/dL — AB (ref 65–99)
Glucose-Capillary: 131 mg/dL — ABNORMAL HIGH (ref 65–99)
Glucose-Capillary: 140 mg/dL — ABNORMAL HIGH (ref 65–99)
Glucose-Capillary: 132 mg/dL — ABNORMAL HIGH (ref 65–99)

## 2016-07-16 MED ORDER — LIVING WELL WITH DIABETES BOOK
Freq: Once | Status: AC
  Administered 2016-07-16: 09:00:00
  Filled 2016-07-16: qty 1

## 2016-07-16 MED ORDER — ORAL CARE MOUTH RINSE
15.0000 mL | Freq: Two times a day (BID) | OROMUCOSAL | Status: DC
  Administered 2016-07-17 – 2016-07-18 (×3): 15 mL via OROMUCOSAL

## 2016-07-16 MED ORDER — CHLORHEXIDINE GLUCONATE 0.12 % MT SOLN
15.0000 mL | Freq: Two times a day (BID) | OROMUCOSAL | Status: DC
  Administered 2016-07-16 – 2016-07-18 (×3): 15 mL via OROMUCOSAL
  Filled 2016-07-16 (×3): qty 15

## 2016-07-16 NOTE — Plan of Care (Signed)
Problem: Food- and Nutrition-Related Knowledge Deficit (NB-1.1) Goal: Nutrition education Formal process to instruct or train a patient/client in a skill or to impart knowledge to help patients/clients voluntarily manage or modify food choices and eating behavior to maintain or improve health. Outcome: Completed/Met Date Met: 07/16/16  RD consulted for nutrition education regarding diabetes.   No results found for: HGBA1C  RD provided "Carbohydrate Counting for People with Diabetes" handout from the Academy of Nutrition and Dietetics. Discussed different food groups and their effects on blood sugar, emphasizing carbohydrate-containing foods. Provided list of carbohydrates and recommended serving sizes of common foods.  Discussed importance of controlled and consistent carbohydrate intake throughout the day. Provided examples of ways to balance meals/snacks and encouraged intake of high-fiber, whole grain complex carbohydrates. Teach back method used.  Expect fair compliance. Would benefit from outpatient education for follow-up.  Body mass index is 34.8 kg/m. Pt meets criteria for obesity based on current BMI.  Current diet order is 2GMNA , patient is consuming approximately 100% of meals at this time. Labs and medications reviewed. No further nutrition interventions warranted at this time. If additional nutrition issues arise, please re-consult RD.  Clayton Bibles, MS, RD, LDN Pager: [phone] After Hours Pager: [phone]

## 2016-07-16 NOTE — Progress Notes (Signed)
Inpatient Diabetes Program Recommendations  AACE/ADA: New Consensus Statement on Inpatient Glycemic Control (2015)  Target Ranges:  Prepandial:   less than 140 mg/dL      Peak postprandial:   less than 180 mg/dL (1-2 hours)      Critically ill patients:  140 - 180 mg/dL   Results for Jose [name], Jose [name] (MRN [id number]) as of 07/16/2016 14:18  Ref. Range 07/15/2016 13:08 07/15/2016 19:36 07/15/2016 22:12 07/16/2016 07:30 07/16/2016 12:42  Glucose-Capillary Latest Ref Range: 65 - 99 mg/dL 236 (H) 123 (H) 115 (H) 131 (H) 140 (H)    Admit with: Hepatic Encephalopathy/ New Diagnosis of DM  History: ETOH  Current Insulin Orders: Novolog Sensitive Correction Scale/ SSI (0-9 units) TID AC + HS       Met with patient today to discuss possibility of new diagnosis of DM with him.  Patient was groggy but was able to converse with me.  Explained what an A1C is, basic pathophysiology of DM Type 2, basic home care, basic diabetes diet nutrition principles, importance of checking CBGs and maintaining good CBG control to prevent long-term and short-term complications.  Also reviewed blood sugar goals and A1c goals for home.    RNs to provide ongoing basic DM education at bedside with this patient.  Have ordered educational booklet and DM videos.  Have also placed RD consult for DM diet education for this patient.  I asked pt for permission to call his sister, [name] to explain to her about his blood sugars and possible new diagnosis of DM.  Patient gave me permission to call sister but told me she has an appt at [facility] today and asked me to wait until tomorrow to call her.     MD- If new diagnosis of DM made, patient may likely need DM meds at time of discharge once we know what his current A1c level is.  Metformin not a good match since pt has Cirrhosis.  Patient may do better with DPP-4 inhibitor drug like Tradjenta 5 mg daily.  Since this drug is glucose-dependent, it has less risk of Hypoglycemia since it's  efficacy decreases as CBGs decrease.  [facility] is on the preferred list for [place] [employer]      --Will follow patient during hospitalization--  Wyn Quaker RN, MSN, CDE Diabetes Coordinator Inpatient Glycemic Control Team Team Pager: [phone] (8a-5p)

## 2016-07-16 NOTE — Progress Notes (Signed)
PROGRESS NOTE    Jose [name]  PPJ:[id number] DOB: 12-17-1955 DOA: 07/15/2016 PCP: Carmie Kanner, NP   Chief Complaint  Patient presents with  . Hyperglycemia    Brief Narrative:  HPI on 07/15/2016 by Dr. Jennette Kettle Jose [name] is a 61 y.o. male with medical history significant of EtOH cirrhosis, recurrent admits for hepatic encephalopathy, EtOH abuse currently in drug rehab.  Patient was seen in Dr. Matthias Hughs office today, found to have likely new onset DM2 with CBG of 394 (treated with 15 units of insulin), but was also noted to be confused with unsteady gait.  Due to concern for this patient was sent in to the ED. Patient was last admitted to our service for hepatic encephalopathy just over a month ago in Feb.  Also admitted in Jan and Dec for the same. It turns out that the patient ran out of his lactulose "a couple of days ago" he states.  Last drug use has been over "a month and a half at least".  No EtOH in over "2-4 weeks".  Assessment & Plan   Acute hepatic encephalopathy -Continue lactulose and rifaxan -Patient supposedly ran out of lactulose  -CT head: No acute intracranial pathology -Chest x-ray and UA unremarkable for infection -Urine drug screen negative, alcohol level less than 5 -Ammonia level on admission 77 -mental status appears to be improving   Chronic Thromocytopenia  -platelets 65, appears to be stable -Continue to monitor CBC  Possible new onset diabetes mellitus, type II -Patient was at his PCPs office, noted to have CBG of 394 and was given 15 units of insulin -Hemoglobin A1c pending -Continue insulin sliding scale with CBG monitoring  Alcoholic cirrhosis with chronic hepatitis C -Continue Lasix, spironolactone, low-sodium diet -Dyazide discontinued (per last discharge, this was to be discontinued)  Hemochromatosis -Patient does receive therapeutic phlebotomies every 3 months, last done on 07/07/16 -followed by Dr.Gorsuch  DVT Prophylaxis   SCDs  Code Status: Full  Family Communication: None at bedside  Disposition Plan: In observation, continue to monitor  Consultants None  Procedures  None  Antibiotics   Anti-infectives    Start     Dose/Rate Route Frequency Ordered Stop   07/15/16 2200  rifaximin (XIFAXAN) tablet 550 mg     550 mg Oral 2 times daily 07/15/16 1932        Subjective:   Jose [name] seen and examined today.  Patient states he is feeling better. Admits that he ran out of medications a few days ago. Currently denies chest pain, shortness of breath, abdominal pain, nausea or vomiting, diarrhea or constipation. States he takes his lactulose as directed on the bottle and usually has loose bowel movements.    Objective:   Vitals:   07/15/16 2125 07/15/16 2226 07/15/16 2335 07/16/16 0415  BP: (!) 131/97 131/76 122/78 125/75  Pulse: (!) 52 (!) 53 (!) 53 63  Resp: 14 15 16 15   Temp:   97.8 F (36.6 C) 98.6 F (37 C)  TempSrc:   Oral Oral  SpO2: 100% 100% 100% 100%  Weight:   97.8 kg (215 lb 9.8 oz)   Height:   5\' 6"  (1.676 m)     Intake/Output Summary (Last 24 hours) at 07/16/16 1122 Last data filed at 07/16/16 0842  Gross per 24 hour  Intake              844 ml  Output  500 ml  Net              344 ml   Filed Weights   07/15/16 1305 07/15/16 2335  Weight: 86.6 kg (191 lb) 97.8 kg (215 lb 9.8 oz)    Exam  General: Well developed, well nourished, NAD, appears stated age  31: NCAT, mucous membranes moist.   Cardiovascular: S1 S2 auscultated, no rubs, murmurs or gallops. Regular rate and rhythm.  Respiratory: Clear to auscultation bilaterally with equal chest rise  Abdomen: Soft, nontender, nondistended, + bowel sounds  Extremities: warm dry without cyanosis clubbing or edema  Neuro: AAOx3, nonfocal  Psych: Normal affect and demeanor    Data Reviewed: I have personally reviewed following labs and imaging studies  CBC:  Recent Labs Lab 07/15/16 1429  WBC  3.4*  HGB 13.2  HCT 37.4*  MCV 99.5  PLT 65*   Basic Metabolic Panel:  Recent Labs Lab 07/15/16 1429  NA 138  K 3.5  CL 109  CO2 23  GLUCOSE 70  BUN 13  CREATININE 1.18  CALCIUM 8.0*   GFR: Estimated Creatinine Clearance: 72.9 mL/min (by C-G formula based on SCr of 1.18 mg/dL). Liver Function Tests:  Recent Labs Lab 07/15/16 1429  AST 118*  ALT 53  ALKPHOS 243*  BILITOT 1.6*  PROT 7.3  ALBUMIN 2.5*    Recent Labs Lab 07/15/16 1429  LIPASE 63*    Recent Labs Lab 07/15/16 1725  AMMONIA 77*   Coagulation Profile: No results for input(s): INR, PROTIME in the last 168 hours. Cardiac Enzymes: No results for input(s): CKTOTAL, CKMB, CKMBINDEX, TROPONINI in the last 168 hours. BNP (last 3 results) No results for input(s): PROBNP in the last 8760 hours. HbA1C: No results for input(s): HGBA1C in the last 72 hours. CBG:  Recent Labs Lab 07/15/16 1308 07/15/16 1936 07/15/16 2212 07/16/16 0730  GLUCAP 236* 123* 115* 131*   Lipid Profile: No results for input(s): CHOL, HDL, LDLCALC, TRIG, CHOLHDL, LDLDIRECT in the last 72 hours. Thyroid Function Tests: No results for input(s): TSH, T4TOTAL, FREET4, T3FREE, THYROIDAB in the last 72 hours. Anemia Panel: No results for input(s): VITAMINB12, FOLATE, FERRITIN, TIBC, IRON, RETICCTPCT in the last 72 hours. Urine analysis:    Component Value Date/Time   COLORURINE YELLOW 07/15/2016 1403   APPEARANCEUR CLEAR 07/15/2016 1403   LABSPEC 1.021 07/15/2016 1403   PHURINE 6.0 07/15/2016 1403   GLUCOSEU >=500 (A) 07/15/2016 1403   HGBUR MODERATE (A) 07/15/2016 1403   BILIRUBINUR NEGATIVE 07/15/2016 1403   KETONESUR NEGATIVE 07/15/2016 1403   PROTEINUR NEGATIVE 07/15/2016 1403   UROBILINOGEN 4.0 (H) 04/26/2013 0500   NITRITE NEGATIVE 07/15/2016 1403   LEUKOCYTESUR NEGATIVE 07/15/2016 1403   Sepsis Labs: @LABRCNTIP (procalcitonin:4,lacticidven:4)  )No results found for this or any previous visit (from the past  240 hour(s)).    Radiology Studies: Dg Chest 2 View  Result Date: 07/15/2016 CLINICAL DATA:  Weakness EXAM: CHEST  2 VIEW COMPARISON:  1132 catheter is seen FINDINGS: Mild cardiac enlargement. Negative for heart failure. Lungs are clear without infiltrate effusion or mass. IMPRESSION: No active cardiopulmonary disease. Electronically Signed   By: Franchot Gallo M.D.   On: 07/15/2016 14:16   Ct Head Wo Contrast  Result Date: 07/15/2016 CLINICAL DATA:  Acute onset of altered mental status and nausea. Initial encounter. EXAM: CT HEAD WITHOUT CONTRAST TECHNIQUE: Contiguous axial images were obtained from the base of the skull through the vertex without intravenous contrast. COMPARISON:  CT of the head performed 05/10/2016  FINDINGS: Brain: No evidence of acute infarction, hemorrhage, hydrocephalus, extra-axial collection or mass lesion/mass effect. Mild subcortical white matter change likely reflects small vessel ischemic microangiopathy. Mild cerebellar atrophy is noted. The brainstem and fourth ventricle are within normal limits. The basal ganglia are unremarkable in appearance. The cerebral hemispheres demonstrate grossly normal gray-white differentiation. No mass effect or midline shift is seen. Vascular: No hyperdense vessel or unexpected calcification. Skull: There is no evidence of fracture; there is chronic deformity of the medial walls of the orbits bilaterally. Sinuses/Orbits: The visualized portions of the orbits are otherwise within normal limits. The paranasal sinuses and mastoid air cells are well-aerated. Other: No significant soft tissue abnormalities are seen. IMPRESSION: 1. No acute intracranial pathology seen on CT. 2. Mild small vessel ischemic microangiopathy. Electronically Signed   By: Garald Balding M.D.   On: 07/15/2016 18:32     Scheduled Meds: . chlorhexidine  15 mL Mouth Rinse BID  . fluticasone  2 spray Each Nare Daily  . folic acid  1 mg Oral Daily  . furosemide  40 mg Oral  Daily  . insulin aspart  0-9 Units Subcutaneous TID WC  . lactulose  30 g Oral TID  . living well with diabetes book   Does not apply Once  . mouth rinse  15 mL Mouth Rinse q12n4p  . multivitamin with minerals  1 tablet Oral Daily  . nicotine  21 mg Transdermal Daily  . pantoprazole  40 mg Oral BID AC  . potassium chloride SA  20 mEq Oral Daily  . rifaximin  550 mg Oral BID  . spironolactone  200 mg Oral Daily   Continuous Infusions:   LOS: 0 days   Time Spent in minutes   30 minutes  Jose [name] D.O. on 07/16/2016 at 11:22 AM  Between 7am to 7pm - Pager - [phone]  After 7pm go to [email or web address] - password [email or web address]  And look for the night coverage person covering for me after hours  [facility] Office  [phone]

## 2016-07-17 DIAGNOSIS — E876 Hypokalemia: Secondary | ICD-10-CM

## 2016-07-17 LAB — CBC
HEMATOCRIT: 31.5 % — AB (ref 39.0–52.0)
HEMOGLOBIN: 11.3 g/dL — AB (ref 13.0–17.0)
MCH: 35.5 pg — ABNORMAL HIGH (ref 26.0–34.0)
MCHC: 35.9 g/dL (ref 30.0–36.0)
MCV: 99.1 fL (ref 78.0–100.0)
Platelets: 49 10*3/uL — ABNORMAL LOW (ref 150–400)
RBC: 3.18 MIL/uL — ABNORMAL LOW (ref 4.22–5.81)
RDW: 14.2 % (ref 11.5–15.5)
WBC: 3.9 10*3/uL — ABNORMAL LOW (ref 4.0–10.5)

## 2016-07-17 LAB — HEMOGLOBIN A1C
Hgb A1c MFr Bld: 5.2 % (ref 4.8–5.6)
Mean Plasma Glucose: 103 mg/dL

## 2016-07-17 LAB — COMPREHENSIVE METABOLIC PANEL
ALBUMIN: 1.9 g/dL — AB (ref 3.5–5.0)
ALK PHOS: 240 U/L — AB (ref 38–126)
ALT: 46 U/L (ref 17–63)
AST: 90 U/L — AB (ref 15–41)
Anion gap: 6 (ref 5–15)
BILIRUBIN TOTAL: 1.7 mg/dL — AB (ref 0.3–1.2)
BUN: 13 mg/dL (ref 6–20)
CO2: 19 mmol/L — ABNORMAL LOW (ref 22–32)
Calcium: 7.5 mg/dL — ABNORMAL LOW (ref 8.9–10.3)
Chloride: 109 mmol/L (ref 101–111)
Creatinine, Ser: 0.85 mg/dL (ref 0.61–1.24)
GFR calc Af Amer: >60 mL/min (ref >60)
GFR calc non Af Amer: >60 mL/min (ref >60)
GLUCOSE: 126 mg/dL — AB (ref 65–99)
Potassium: 2.9 mmol/L — ABNORMAL LOW (ref 3.5–5.1)
Sodium: 134 mmol/L — ABNORMAL LOW (ref 135–145)
TOTAL PROTEIN: 5.8 g/dL — AB (ref 6.5–8.1)

## 2016-07-17 LAB — GLUCOSE, CAPILLARY: Glucose-Capillary: 116 mg/dL — ABNORMAL HIGH (ref 65–99)

## 2016-07-17 LAB — HIV ANTIBODY (ROUTINE TESTING W REFLEX): HIV SCREEN 4TH GENERATION: NONREACTIVE

## 2016-07-17 LAB — AMMONIA: Ammonia: 88 umol/L — ABNORMAL HIGH (ref 9–35)

## 2016-07-17 LAB — LIPASE, BLOOD: Lipase: 211 U/L — ABNORMAL HIGH (ref 11–51)

## 2016-07-17 MED ORDER — TRAMADOL HCL 50 MG PO TABS
50.0000 mg | ORAL_TABLET | Freq: Once | ORAL | Status: AC
  Administered 2016-07-17: 50 mg via ORAL
  Filled 2016-07-17: qty 1

## 2016-07-17 MED ORDER — HYDROCODONE-ACETAMINOPHEN 5-325 MG PO TABS
2.0000 | ORAL_TABLET | Freq: Once | ORAL | Status: AC
  Administered 2016-07-17: 2 via ORAL
  Filled 2016-07-17: qty 2

## 2016-07-17 MED ORDER — POTASSIUM CHLORIDE CRYS ER 20 MEQ PO TBCR
40.0000 meq | EXTENDED_RELEASE_TABLET | Freq: Once | ORAL | Status: AC
  Administered 2016-07-17: 40 meq via ORAL
  Filled 2016-07-17: qty 2

## 2016-07-17 NOTE — Progress Notes (Signed)
PROGRESS NOTE    CAID RADIN  Jose [name], Jose DOA: 07/15/2016 PCP: Carmie Kanner, Jose   Chief Complaint  Patient presents with  . Hyperglycemia    Brief Narrative:  HPI on 07/15/2016 by Dr. Jennette Kettle Jose [name] is a 61 y.o. male with medical history significant of EtOH [name], Jose [name], EtOH abuse currently in drug rehab.  Patient was seen in Dr. Matthias Hughs office today, found to have likely new onset DM2 with CBG of 394 (treated with 15 units of insulin), but was also noted to be confused with unsteady gait.  Due to concern for this patient was sent in to the ED. Patient was last admitted to our service for hepatic [name] just over a month ago in Feb.  Also admitted in Jan and Dec for the same. It turns out that the patient ran out of his lactulose "a couple of days ago" he states.  Last drug use has been over "a month and a half at least".  No EtOH in over "2-4 weeks".  Assessment & Plan   Acute hepatic [name] -Improving  -Continue lactulose and rifaxan -Patient supposedly ran out of lactulose  -CT head: No acute intracranial pathology -Chest x-ray and UA unremarkable for infection -Urine drug screen negative, alcohol level less than 5 -Ammonia level on admission 77, currently 88  Hypokalemia -Potassium dropped to 2.9 -likely secondary to GI losses as patient is receiving lactulose -Will replace and continue to monitor BMP  Chronic Thromocytopenia  -platelets 49, appears to be stable -Continue to monitor CBC  Hyperglycemia -Patient was at his PCPs office, noted to have CBG of 394 and was given 15 units of insulin -Hemoglobin H9Q 5.2  Alcoholic [name] with chronic hepatitis C -Continue Lasix, spironolactone, low-sodium diet -Dyazide discontinued (per last discharge, this was to be discontinued)  Hemochromatosis -Patient does receive therapeutic phlebotomies every 3 months, last done on  07/07/16 -followed by Dr.Gorsuch  DVT Prophylaxis  SCDs  Code Status: Full  Family Communication: None at bedside  Disposition Plan: Admitted, continue to monitor  Consultants None  Procedures  None  Antibiotics   Anti-infectives    Start     Dose/Rate Route Frequency Ordered Stop   07/15/16 2200  rifaximin (XIFAXAN) tablet 550 mg     550 mg Oral 2 times daily 07/15/16 1932        Subjective:   Owen Pratte seen and examined today.  Patient complains of right leg pain which started last night. Feels it is a cramping type of pain.  Currently denies chest pain, shortness of breath, abdominal pain, nausea or vomiting, diarrhea or constipation.   Objective:   Vitals:   07/16/16 0415 07/16/16 1436 07/16/16 2113 07/17/16 0431  BP: 125/75 119/68 129/74 108/67  Pulse: 63 66 68 63  Resp: 15 17 16 15   Temp: 98.6 F (37 C) 98.4 F (36.9 C) 98.6 F (37 C) 98.6 F (37 C)  TempSrc: Oral Oral Oral Oral  SpO2: 100% 100% 100% 100%  Weight:      Height:        Intake/Output Summary (Last 24 hours) at 07/17/16 1206 Last data filed at 07/17/16 0814  Gross per 24 hour  Intake             1090 ml  Output              400 ml  Net  690 ml   Filed Weights   07/15/16 1305 07/15/16 2335  Weight: 86.6 kg (191 lb) 97.8 kg (215 lb 9.8 oz)    Exam  General: Well developed, well nourished, NAD, appears stated age  61: NCAT, mucous membranes moist.   Cardiovascular: S1 S2 auscultated, no murmurs, RRR  Respiratory: Clear to auscultation bilaterally with equal chest rise  Abdomen: Soft, nontender, nondistended, + bowel sounds  Extremities: warm dry without cyanosis clubbing or edema  Neuro: AAOx3, nonfocal  Psych: Normal affect and demeanor    Data Reviewed: I have personally reviewed following labs and imaging studies  CBC:  Recent Labs Lab 07/15/16 1429 07/17/16 0411  WBC 3.4* 3.9*  HGB 13.2 11.3*  HCT 37.4* 31.5*  MCV 99.5 99.1  PLT 65* 49*    Basic Metabolic Panel:  Recent Labs Lab 07/15/16 1429 07/17/16 0411  NA 138 134*  K 3.5 2.9*  CL 109 109  CO2 23 19*  GLUCOSE 70 126*  BUN 13 13  CREATININE 1.18 0.85  CALCIUM 8.0* 7.5*   GFR: Estimated Creatinine Clearance: 101.2 mL/min (by C-G formula based on SCr of 0.85 mg/dL). Liver Function Tests:  Recent Labs Lab 07/15/16 1429 07/17/16 0411  AST 118* 90*  ALT 53 46  ALKPHOS 243* 240*  BILITOT 1.6* 1.7*  PROT 7.3 5.8*  ALBUMIN 2.5* 1.9*    Recent Labs Lab 07/15/16 1429 07/17/16 0411  LIPASE 63* 211*    Recent Labs Lab 07/15/16 1725 07/17/16 0411  AMMONIA 77* 88*   Coagulation Profile: No results for input(s): INR, PROTIME in the last 168 hours. Cardiac Enzymes: No results for input(s): CKTOTAL, CKMB, CKMBINDEX, TROPONINI in the last 168 hours. BNP (last 3 results) No results for input(s): PROBNP in the last 8760 hours. HbA1C:  Recent Labs  07/15/16 2338  HGBA1C 5.2   CBG:  Recent Labs Lab 07/16/16 0730 07/16/16 1242 07/16/16 1730 07/16/16 2116 07/17/16 0730  GLUCAP 131* 140* 132* 100* 116*   Lipid Profile: No results for input(s): CHOL, HDL, LDLCALC, TRIG, CHOLHDL, LDLDIRECT in the last 72 hours. Thyroid Function Tests: No results for input(s): TSH, T4TOTAL, FREET4, T3FREE, THYROIDAB in the last 72 hours. Anemia Panel: No results for input(s): VITAMINB12, FOLATE, FERRITIN, TIBC, IRON, RETICCTPCT in the last 72 hours. Urine analysis:    Component Value Date/Time   COLORURINE YELLOW 07/15/2016 1403   APPEARANCEUR CLEAR 07/15/2016 1403   LABSPEC 1.021 07/15/2016 1403   PHURINE 6.0 07/15/2016 1403   GLUCOSEU >=500 (A) 07/15/2016 1403   HGBUR MODERATE (A) 07/15/2016 1403   BILIRUBINUR NEGATIVE 07/15/2016 1403   KETONESUR NEGATIVE 07/15/2016 1403   PROTEINUR NEGATIVE 07/15/2016 1403   UROBILINOGEN 4.0 (H) 04/26/2013 0500   NITRITE NEGATIVE 07/15/2016 1403   LEUKOCYTESUR NEGATIVE 07/15/2016 1403   Sepsis  Labs: @LABRCNTIP (procalcitonin:4,lacticidven:4)  )No results found for this or any previous visit (from the past 240 hour(s)).    Radiology Studies: Dg Chest 2 View  Result Date: 07/15/2016 CLINICAL DATA:  Weakness EXAM: CHEST  2 VIEW COMPARISON:  1132 catheter is seen FINDINGS: Mild cardiac enlargement. Negative for heart failure. Lungs are clear without infiltrate effusion or mass. IMPRESSION: No active cardiopulmonary disease. Electronically Signed   By: Franchot Gallo M.D.   On: 07/15/2016 14:16   Ct Head Wo Contrast  Result Date: 07/15/2016 CLINICAL DATA:  Acute onset of altered mental status and nausea. Initial encounter. EXAM: CT HEAD WITHOUT CONTRAST TECHNIQUE: Contiguous axial images were obtained from the base of the skull through the  vertex without intravenous contrast. COMPARISON:  CT of the head performed 05/10/2016 FINDINGS: Brain: No evidence of acute infarction, hemorrhage, hydrocephalus, extra-axial collection or mass lesion/mass effect. Mild subcortical white matter change likely reflects small vessel ischemic microangiopathy. Mild cerebellar atrophy is noted. The brainstem and fourth ventricle are within normal limits. The basal ganglia are unremarkable in appearance. The cerebral hemispheres demonstrate grossly normal gray-white differentiation. No mass effect or midline shift is seen. Vascular: No hyperdense vessel or unexpected calcification. Skull: There is no evidence of fracture; there is chronic deformity of the medial walls of the orbits bilaterally. Sinuses/Orbits: The visualized portions of the orbits are otherwise within normal limits. The paranasal sinuses and mastoid air cells are well-aerated. Other: No significant soft tissue abnormalities are seen. IMPRESSION: 1. No acute intracranial pathology seen on CT. 2. Mild small vessel ischemic microangiopathy. Electronically Signed   By: Garald Balding M.D.   On: 07/15/2016 18:32     Scheduled Meds: . chlorhexidine  15 mL  Mouth Rinse BID  . fluticasone  2 spray Each Nare Daily  . folic acid  1 mg Oral Daily  . furosemide  40 mg Oral Daily  . insulin aspart  0-9 Units Subcutaneous TID WC  . lactulose  30 g Oral TID  . mouth rinse  15 mL Mouth Rinse q12n4p  . multivitamin with minerals  1 tablet Oral Daily  . nicotine  21 mg Transdermal Daily  . pantoprazole  40 mg Oral BID AC  . potassium chloride SA  20 mEq Oral Daily  . rifaximin  550 mg Oral BID  . spironolactone  200 mg Oral Daily   Continuous Infusions:   LOS: 1 day   Time Spent in minutes   30 minutes  Jahson Emanuele D.O. on 07/17/2016 at 12:06 PM  Between 7am to 7pm - Pager - [phone]  After 7pm go to [email or web address] - password [email or web address]  And look for the night coverage person covering for me after hours  [facility] Office  [phone]

## 2016-07-18 DIAGNOSIS — B182 Chronic viral hepatitis C: Secondary | ICD-10-CM

## 2016-07-18 DIAGNOSIS — K7200 Acute and subacute hepatic failure without coma: Secondary | ICD-10-CM

## 2016-07-18 DIAGNOSIS — K7031 Alcoholic cirrhosis of liver with ascites: Secondary | ICD-10-CM

## 2016-07-18 DIAGNOSIS — D696 Thrombocytopenia, unspecified: Secondary | ICD-10-CM

## 2016-07-18 DIAGNOSIS — R739 Hyperglycemia, unspecified: Secondary | ICD-10-CM | POA: Diagnosis present

## 2016-07-18 DIAGNOSIS — I10 Essential (primary) hypertension: Secondary | ICD-10-CM

## 2016-07-18 LAB — BASIC METABOLIC PANEL
Anion gap: 4 — ABNORMAL LOW (ref 5–15)
BUN: 13 mg/dL (ref 6–20)
CALCIUM: 7.5 mg/dL — AB (ref 8.9–10.3)
CO2: 19 mmol/L — ABNORMAL LOW (ref 22–32)
Chloride: 111 mmol/L (ref 101–111)
Creatinine, Ser: 0.95 mg/dL (ref 0.61–1.24)
GFR calc Af Amer: >60 mL/min (ref >60)
GLUCOSE: 142 mg/dL — AB (ref 65–99)
Potassium: 3.5 mmol/L (ref 3.5–5.1)
SODIUM: 134 mmol/L — AB (ref 135–145)

## 2016-07-18 LAB — CBC
HCT: 31.3 % — ABNORMAL LOW (ref 39.0–52.0)
Hemoglobin: 11.2 g/dL — ABNORMAL LOW (ref 13.0–17.0)
MCH: 35.4 pg — AB (ref 26.0–34.0)
MCHC: 35.8 g/dL (ref 30.0–36.0)
MCV: 99.1 fL (ref 78.0–100.0)
PLATELETS: 51 10*3/uL — AB (ref 150–400)
RBC: 3.16 MIL/uL — ABNORMAL LOW (ref 4.22–5.81)
RDW: 14.1 % (ref 11.5–15.5)
WBC: 3.8 10*3/uL — ABNORMAL LOW (ref 4.0–10.5)

## 2016-07-18 LAB — MAGNESIUM: Magnesium: 1.7 mg/dL (ref 1.7–2.4)

## 2016-07-18 LAB — AMMONIA: AMMONIA: 127 umol/L — AB (ref 9–35)

## 2016-07-18 MED ORDER — RIFAXIMIN 550 MG PO TABS: 550.0000 mg | ORAL_TABLET | Freq: Two times a day (BID) | ORAL | 2 refills | Status: DC

## 2016-07-18 MED ORDER — ALBUTEROL SULFATE HFA 108 (90 BASE) MCG/ACT IN AERS: 2.0000 | INHALATION_SPRAY | Freq: Four times a day (QID) | RESPIRATORY_TRACT | 0 refills | Status: DC | PRN

## 2016-07-18 MED ORDER — OMEPRAZOLE 20 MG PO CPDR: 40.0000 mg | DELAYED_RELEASE_CAPSULE | Freq: Two times a day (BID) | ORAL | 2 refills | Status: DC

## 2016-07-18 MED ORDER — LACTULOSE 10 GM/15ML PO SOLN: 30.0000 g | Freq: Three times a day (TID) | ORAL | 1 refills | Status: DC

## 2016-07-18 MED ORDER — NICOTINE 21 MG/24HR TD PT24: 21.0000 mg | MEDICATED_PATCH | Freq: Every day | TRANSDERMAL | 0 refills | Status: DC

## 2016-07-18 MED ORDER — SPIRONOLACTONE 100 MG PO TABS: 200.0000 mg | ORAL_TABLET | Freq: Every day | ORAL | 0 refills | Status: DC

## 2016-07-18 MED FILL — OMEPRAZOLE DR 20 MG CAPSULE: 20 | 30 days supply | Qty: 120 | Fill #0

## 2016-07-18 MED FILL — NICOTINE 21 MG/24HR PATCH: 21 | 28 days supply | Qty: 28 | Fill #0

## 2016-07-18 MED FILL — XIFAXAN 550 MG TABLET: 550 | 30 days supply | Qty: 60 | Fill #0

## 2016-07-18 MED FILL — PROVENTIL HFA 90 MCG INH: 108 (90 BAS | 25 days supply | Qty: 7 | Fill #0

## 2016-07-18 MED FILL — GENERLAC 10 GM/15 ML SOLN: 10 | 14 days supply | Qty: 1892 | Fill #0

## 2016-07-18 MED FILL — spIRONOLACTONE 100 MG TAB: 100 | 30 days supply | Qty: 60 | Fill #0

## 2016-07-18 NOTE — Progress Notes (Signed)
Inpatient Diabetes Program Recommendations  AACE/ADA: New Consensus Statement on Inpatient Glycemic Control (2015)  Target Ranges:  Prepandial:   less than 140 mg/dL      Peak postprandial:   less than 180 mg/dL (1-2 hours)      Critically ill patients:  140 - 180 mg/dL   Review of Glycemic Control  Hyperglycemia in the setting of acute hepatic encephalopathy in missing Lactulose doses. Initially glucose in the 390's, suspecting new diagnosis however A1c 5.2%. Patient needing Novolog Correction only minimally. Would suggest patient to follow up with A1c levels with his PCP.  Thanks,  Tama Headings RN, MSN, [facility] Inpatient Diabetes Coordinator Team Pager [phone] (8a-5p)

## 2016-07-18 NOTE — Progress Notes (Signed)
PT Cancellation Note  Patient Details Name: Jose [name] MRN: [id number] DOB: Sep 26, 1955   Cancelled Treatment:    Reason Eval/Treat Not Completed: Other (comment) (Order received however, pt discharged and room empty.)   Romona Murdy,KATHrine E 07/18/2016, 2:10 PM Carmelia Bake, PT, DPT 07/18/2016 Pager: [phone]

## 2016-07-18 NOTE — Discharge Summary (Signed)
Physician Discharge Summary  Jose [name] ZYY:[id number] DOB: [birth year] DOA: 07/15/2016  PCP: [name], MD  Admit date: 07/15/2016 Discharge date: 07/18/2016  Time spent: 65 minutes  Recommendations for Outpatient Follow-up:  1. Patient will be discharged back to his drug rehabilitation program. 2. Follow-up with OSEI-BONSU,GEORGE, MD in 2 weeks. On follow-up patient will need a CBC done to follow-up on hemoglobin and platelet count. Patient also need a basic metabolic profile done to follow-up on electrolytes and renal function. Patient will also need a magnesium level checked.   Discharge Diagnoses:  Principal Problem:   Acute hepatic encephalopathy Active Problems:   Hyperglycemia, unspecified   Thrombocytopenia (HCC)   Alcoholic cirrhosis of liver with ascites (HCC)   Essential hypertension   Hemochromatosis   Hepatitis C, chronic (HCC)   Discharge Condition: Stable and improved.  Diet recommendation: Heart healthy.  Filed Weights   07/15/16 1305 07/15/16 2335  Weight: 86.6 kg (191 lb) 97.8 kg (215 lb 9.8 oz)    History of present illness:  Per Dr Aleatha Borer is a 61 y.o. male with medical history significant of EtOH cirrhosis, recurrent admits for hepatic encephalopathy, EtOH abuse currently in drug rehab.  Patient was seen in Dr. Matthias Hughs office today, found to have likely new onset DM2 with CBG of 394 (treated with 15 units of insulin), but was also noted to be confused with unsteady gait.  Due to concern for this patient was sent in to the ED.  Patient was last admitted to our service for hepatic encephalopathy just over a month ago in Feb.  Also admitted in Jan and Dec for the same.  It turns out that the patient ran out of his lactulose "a couple of days ago" he states.  Last drug use has been over "a month and a half at least".  No EtOH in over "2-4 weeks".  ED Course: His ammonia level today is elevated at 77.  LFTs slightly elevated but  are so at baseline.  Platelets also baseline low at 65.  CT head is negative.  Hospital Course:  Acute hepatic encephalopathy - Secondary to medical noncompliance that patient stated he ran out of his lactulose for several days prior to admission. -Patient was placed on lactulose and Xifaxan. CT head which was done was negative for any acute intracranial pathology. Patient had no signs or symptoms of infection as chest x-ray was negative and urinalysis was unremarkable. UDS was negative. AlCOHOL was less than 5. Ammonia level on admission was 77. - Patient improved clinically during the hospitalization and was close to baseline by day of discharge. Patient is to follow-up with PCP 2 weeks post discharge.  Chronic Thromocytopenia  -platelets 51 on day of discharge however remained stable throughout those laceration. Patient had no evidence of bleeding.  Hyperglycemia. -Patient was at his PCPs office, noted to have CBG of 394 and was given 15 units of insulin -Hemoglobin A1c was 5.2. Patient during the hospitalization was maintained on a sliding scale insulin. Outpatient follow-up.   Alcoholic cirrhosis with chronic hepatitis C -Patient was maintained on home regimen of Lasix, spironolactone, low-sodium diet -Dyazide discontinued (per last discharge, this was to be discontinued)  Hemochromatosis -Patient does receive therapeutic phlebotomies every 3 months, last done on 07/07/16 -followed by Dr.Gorsuch. Outpatient follow-up.   Procedures:  None  Consultations:  None  Discharge Exam: Vitals:   07/17/16 2004 07/18/16 0524  BP: 130/84 134/63  Pulse: 71 (!) 49  Resp: 14 14  Temp: 98.5 F (36.9 C) 97.9 F (36.6 C)    General: NAD Cardiovascular: RRR Respiratory: CTAB  Discharge Instructions   Discharge Instructions    Diet - low sodium heart healthy    Complete by:  As directed    Increase activity slowly    Complete by:  As directed      Discharge Medication  List as of 07/18/2016  1:17 PM    CONTINUE these medications which have CHANGED   Details  albuterol (PROVENTIL HFA;VENTOLIN HFA) 108 (90 Base) MCG/ACT inhaler Inhale 2 puffs into the lungs every 6 (six) hours as needed for wheezing or shortness of breath., Starting Fri 07/18/2016, Print    lactulose (CHRONULAC) 10 GM/15ML solution Take 45 mLs (30 g total) by mouth 3 (three) times daily., Starting Fri 07/18/2016, Print    nicotine (NICODERM CQ - DOSED IN MG/24 HOURS) 21 mg/24hr patch Place 1 patch (21 mg total) onto the skin daily., Starting Fri 07/18/2016, Print    omeprazole (PRILOSEC) 20 MG capsule Take 2 capsules (40 mg total) by mouth 2 (two) times daily before a meal., Starting Fri 07/18/2016, Print    rifaximin (XIFAXAN) 550 MG TABS tablet Take 1 tablet (550 mg total) by mouth 2 (two) times daily., Starting Fri 07/18/2016, Print    spironolactone (ALDACTONE) 100 MG tablet Take 2 tablets (200 mg total) by mouth daily., Starting Fri 07/18/2016, Print      CONTINUE these medications which have NOT CHANGED   Details  fluticasone (FLONASE) 50 MCG/ACT nasal spray Place 2 sprays into both nostrils daily., Historical Med    ondansetron (ZOFRAN) 8 MG tablet Take 8 mg by mouth every 8 (eight) hours as needed for nausea or vomiting., Historical Med    traZODone (DESYREL) 100 MG tablet Take 100 mg by mouth at bedtime as needed for sleep., Historical Med    folic acid (FOLVITE) 1 MG tablet Take 1 tablet (1 mg total) by mouth daily., Starting Mon 06/09/2016, Print    furosemide (LASIX) 40 MG tablet Take 1 tablet (40 mg total) by mouth daily., Starting Mon 06/09/2016, Print    KLOR-CON M20 20 MEQ tablet Take 1 tablet (20 mEq total) by mouth 2 (two) times daily., Starting Mon 04/28/2016, Normal    Multiple Vitamin (MULTIVITAMIN) tablet Take 1 tablet by mouth daily., Historical Med       No Known Allergies Follow-up Information    OSEI-BONSU,GEORGE, MD. Schedule an appointment as soon as possible for  a visit in 2 week(s).   Specialty:  Internal Medicine Contact information: [address] [place] [address] [phone]            The results of significant diagnostics from this hospitalization (including imaging, microbiology, ancillary and laboratory) are listed below for reference.    Significant Diagnostic Studies: Dg Chest 2 View  Result Date: 07/15/2016 CLINICAL DATA:  Weakness EXAM: CHEST  2 VIEW COMPARISON:  1132 catheter is seen FINDINGS: Mild cardiac enlargement. Negative for heart failure. Lungs are clear without infiltrate effusion or mass. IMPRESSION: No active cardiopulmonary disease. Electronically Signed   By: Franchot Gallo M.D.   On: 07/15/2016 14:16   Ct Head Wo Contrast  Result Date: 07/15/2016 CLINICAL DATA:  Acute onset of altered mental status and nausea. Initial encounter. EXAM: CT HEAD WITHOUT CONTRAST TECHNIQUE: Contiguous axial images were obtained from the base of the skull through the vertex without intravenous contrast. COMPARISON:  CT of the head performed 05/10/2016 FINDINGS: Brain: No evidence of acute infarction,  hemorrhage, hydrocephalus, extra-axial collection or mass lesion/mass effect. Mild subcortical white matter change likely reflects small vessel ischemic microangiopathy. Mild cerebellar atrophy is noted. The brainstem and fourth ventricle are within normal limits. The basal ganglia are unremarkable in appearance. The cerebral hemispheres demonstrate grossly normal gray-white differentiation. No mass effect or midline shift is seen. Vascular: No hyperdense vessel or unexpected calcification. Skull: There is no evidence of fracture; there is chronic deformity of the medial walls of the orbits bilaterally. Sinuses/Orbits: The visualized portions of the orbits are otherwise within normal limits. The paranasal sinuses and mastoid air cells are well-aerated. Other: No significant soft tissue abnormalities are seen. IMPRESSION: 1. No acute  intracranial pathology seen on CT. 2. Mild small vessel ischemic microangiopathy. Electronically Signed   By: Garald Balding M.D.   On: 07/15/2016 18:32    Microbiology: No results found for this or any previous visit (from the past 240 hour(s)).   Labs: Basic Metabolic Panel:  Recent Labs Lab 07/15/16 1429 07/17/16 0411 07/18/16 0402  NA 138 134* 134*  K 3.5 2.9* 3.5  CL 109 109 111  CO2 23 19* 19*  GLUCOSE 70 126* 142*  BUN 13 13 13   CREATININE 1.18 0.85 0.95  CALCIUM 8.0* 7.5* 7.5*  MG  --   --  1.7   Liver Function Tests:  Recent Labs Lab 07/15/16 1429 07/17/16 0411  AST 118* 90*  ALT 53 46  ALKPHOS 243* 240*  BILITOT 1.6* 1.7*  PROT 7.3 5.8*  ALBUMIN 2.5* 1.9*    Recent Labs Lab 07/15/16 1429 07/17/16 0411  LIPASE 63* 211*    Recent Labs Lab 07/15/16 1725 07/17/16 0411 07/18/16 0402  AMMONIA 77* 88* 127*   CBC:  Recent Labs Lab 07/15/16 1429 07/17/16 0411 07/18/16 0402  WBC 3.4* 3.9* 3.8*  HGB 13.2 11.3* 11.2*  HCT 37.4* 31.5* 31.3*  MCV 99.5 99.1 99.1  PLT 65* 49* 51*   Cardiac Enzymes: No results for input(s): CKTOTAL, CKMB, CKMBINDEX, TROPONINI in the last 168 hours. BNP: BNP (last 3 results)  Recent Labs  07/24/15 0230  BNP 39.8    ProBNP (last 3 results) No results for input(s): PROBNP in the last 8760 hours.  CBG:  Recent Labs Lab 07/16/16 0730 07/16/16 1242 07/16/16 1730 07/16/16 2116 07/17/16 0730  GLUCAP 131* 140* 132* 100* 116*       Signed:  THOMPSON,DANIEL MD.  [facility] 07/18/2016, 5:18 PM

## 2016-07-18 NOTE — Progress Notes (Signed)
Reviewed discharge instructions with patient. Pt verbalized an understanding with no questions or concerns.  Discharged with sister and all belongings.

## 2016-07-27 DIAGNOSIS — I8500 Esophageal varices without bleeding: Secondary | ICD-10-CM

## 2016-07-27 DIAGNOSIS — I864 Gastric varices: Secondary | ICD-10-CM

## 2016-07-27 HISTORY — DX: Gastric varices: I86.4

## 2016-07-27 HISTORY — DX: Esophageal varices without bleeding: I85.00

## 2016-07-30 ENCOUNTER — Encounter (HOSPITAL_COMMUNITY): Payer: Self-pay

## 2016-07-30 ENCOUNTER — Inpatient Hospital Stay (HOSPITAL_COMMUNITY)
Admission: EM | Admit: 2016-07-30 | Discharge: 2016-08-06 | DRG: 640 | Disposition: A | Discharge status: Home / Self Care | Payer: Medicaid Other | Attending: Internal Medicine | Admitting: Internal Medicine

## 2016-07-30 DIAGNOSIS — I10 Essential (primary) hypertension: Secondary | ICD-10-CM | POA: Diagnosis not present

## 2016-07-30 DIAGNOSIS — E860 Dehydration: Secondary | ICD-10-CM | POA: Diagnosis present

## 2016-07-30 DIAGNOSIS — B182 Chronic viral hepatitis C: Secondary | ICD-10-CM | POA: Diagnosis present

## 2016-07-30 DIAGNOSIS — E876 Hypokalemia: Secondary | ICD-10-CM | POA: Diagnosis present

## 2016-07-30 DIAGNOSIS — E871 Hypo-osmolality and hyponatremia: Secondary | ICD-10-CM | POA: Diagnosis present

## 2016-07-30 DIAGNOSIS — N170 Acute kidney failure with tubular necrosis: Secondary | ICD-10-CM | POA: Diagnosis not present

## 2016-07-30 DIAGNOSIS — K7031 Alcoholic cirrhosis of liver with ascites: Secondary | ICD-10-CM | POA: Diagnosis present

## 2016-07-30 DIAGNOSIS — D696 Thrombocytopenia, unspecified: Secondary | ICD-10-CM | POA: Diagnosis present

## 2016-07-30 DIAGNOSIS — E43 Unspecified severe protein-calorie malnutrition: Secondary | ICD-10-CM | POA: Diagnosis present

## 2016-07-30 DIAGNOSIS — G9341 Metabolic encephalopathy: Secondary | ICD-10-CM | POA: Diagnosis present

## 2016-07-30 DIAGNOSIS — R601 Generalized edema: Secondary | ICD-10-CM | POA: Diagnosis present

## 2016-07-30 DIAGNOSIS — Z79899 Other long term (current) drug therapy: Secondary | ICD-10-CM | POA: Diagnosis not present

## 2016-07-30 DIAGNOSIS — E875 Hyperkalemia: Secondary | ICD-10-CM | POA: Diagnosis present

## 2016-07-30 DIAGNOSIS — N181 Chronic kidney disease, stage 1: Secondary | ICD-10-CM | POA: Diagnosis present

## 2016-07-30 DIAGNOSIS — K7682 Hepatic encephalopathy: Secondary | ICD-10-CM

## 2016-07-30 DIAGNOSIS — N179 Acute kidney failure, unspecified: Secondary | ICD-10-CM | POA: Diagnosis present

## 2016-07-30 DIAGNOSIS — I129 Hypertensive chronic kidney disease with stage 1 through stage 4 chronic kidney disease, or unspecified chronic kidney disease: Secondary | ICD-10-CM | POA: Diagnosis present

## 2016-07-30 DIAGNOSIS — K7290 Hepatic failure, unspecified without coma: Secondary | ICD-10-CM | POA: Diagnosis present

## 2016-07-30 DIAGNOSIS — N189 Chronic kidney disease, unspecified: Secondary | ICD-10-CM

## 2016-07-30 DIAGNOSIS — K767 Hepatorenal syndrome: Secondary | ICD-10-CM | POA: Diagnosis present

## 2016-07-30 LAB — COMPREHENSIVE METABOLIC PANEL
ALBUMIN: 2.9 g/dL — AB (ref 3.5–5.0)
ALK PHOS: 271 U/L — AB (ref 38–126)
ALT: 73 U/L — ABNORMAL HIGH (ref 17–63)
ANION GAP: 9 (ref 5–15)
AST: 135 U/L — AB (ref 15–41)
BILIRUBIN TOTAL: 3.1 mg/dL — AB (ref 0.3–1.2)
BUN: 25 mg/dL — AB (ref 6–20)
CALCIUM: 8.5 mg/dL — AB (ref 8.9–10.3)
CO2: 17 mmol/L — AB (ref 22–32)
Chloride: 95 mmol/L — ABNORMAL LOW (ref 101–111)
Creatinine, Ser: 1.4 mg/dL — ABNORMAL HIGH (ref 0.61–1.24)
GFR calc Af Amer: >60 mL/min (ref >60)
GFR calc non Af Amer: 53 mL/min — ABNORMAL LOW (ref >60)
GLUCOSE: 117 mg/dL — AB (ref 65–99)
Potassium: 6.2 mmol/L — ABNORMAL HIGH (ref 3.5–5.1)
SODIUM: 121 mmol/L — AB (ref 135–145)
Total Protein: 8.3 g/dL — ABNORMAL HIGH (ref 6.5–8.1)

## 2016-07-30 LAB — CBC
HEMATOCRIT: 41.5 % (ref 39.0–52.0)
HEMOGLOBIN: 15.2 g/dL (ref 13.0–17.0)
MCH: 35.3 pg — AB (ref 26.0–34.0)
MCHC: 36.6 g/dL — AB (ref 30.0–36.0)
MCV: 96.5 fL (ref 78.0–100.0)
Platelets: 81 10*3/uL — ABNORMAL LOW (ref 150–400)
RBC: 4.3 MIL/uL (ref 4.22–5.81)
RDW: 13.3 % (ref 11.5–15.5)
WBC: 5.6 10*3/uL (ref 4.0–10.5)

## 2016-07-30 LAB — URINALYSIS, ROUTINE W REFLEX MICROSCOPIC
BACTERIA UA: NONE SEEN
Bilirubin Urine: NEGATIVE
Glucose, UA: NEGATIVE mg/dL
Ketones, ur: NEGATIVE mg/dL
Leukocytes, UA: NEGATIVE
Nitrite: NEGATIVE
PH: 6 (ref 5.0–8.0)
Protein, ur: NEGATIVE mg/dL
Specific Gravity, Urine: 1.017 (ref 1.005–1.030)

## 2016-07-30 LAB — BASIC METABOLIC PANEL
Anion gap: 11 (ref 5–15)
BUN: 25 mg/dL — AB (ref 6–20)
CALCIUM: 8.1 mg/dL — AB (ref 8.9–10.3)
CO2: 15 mmol/L — AB (ref 22–32)
Chloride: 94 mmol/L — ABNORMAL LOW (ref 101–111)
Creatinine, Ser: 1.3 mg/dL — ABNORMAL HIGH (ref 0.61–1.24)
GFR calc Af Amer: >60 mL/min (ref >60)
GFR, EST NON AFRICAN AMERICAN: 58 mL/min — AB (ref >60)
GLUCOSE: 100 mg/dL — AB (ref 65–99)
Potassium: 5.2 mmol/L — ABNORMAL HIGH (ref 3.5–5.1)
Sodium: 120 mmol/L — ABNORMAL LOW (ref 135–145)

## 2016-07-30 LAB — RAPID URINE DRUG SCREEN, HOSP PERFORMED
Amphetamines: NOT DETECTED
Barbiturates: NOT DETECTED
Benzodiazepines: NOT DETECTED
COCAINE: NOT DETECTED
OPIATES: NOT DETECTED
Tetrahydrocannabinol: NOT DETECTED

## 2016-07-30 LAB — LIPASE, BLOOD: Lipase: 41 U/L (ref 11–51)

## 2016-07-30 LAB — OSMOLALITY, URINE: OSMOLALITY UR: 605 mosm/kg (ref 300–900)

## 2016-07-30 LAB — I-STAT TROPONIN, ED: TROPONIN I, POC: 0.01 ng/mL (ref 0.00–0.08)

## 2016-07-30 LAB — I-STAT CG4 LACTIC ACID, ED: LACTIC ACID, VENOUS: 1.75 mmol/L (ref 0.5–1.9)

## 2016-07-30 LAB — ETHANOL: Alcohol, Ethyl (B): <5 mg/dL (ref <5)

## 2016-07-30 LAB — SODIUM, URINE, RANDOM: SODIUM UR: 37 mmol/L

## 2016-07-30 LAB — AMMONIA: AMMONIA: 105 umol/L — AB (ref 9–35)

## 2016-07-30 MED ORDER — SODIUM CHLORIDE 0.9 % IV SOLN
Freq: Once | INTRAVENOUS | Status: AC
  Administered 2016-07-30: 16:00:00 via INTRAVENOUS

## 2016-07-30 MED ORDER — ALBUTEROL SULFATE (2.5 MG/3ML) 0.083% IN NEBU: 3.0000 mL | INHALATION_SOLUTION | Freq: Four times a day (QID) | RESPIRATORY_TRACT | Status: DC | PRN

## 2016-07-30 MED ORDER — TRAZODONE HCL 100 MG PO TABS
100.0000 mg | ORAL_TABLET | Freq: Every evening | ORAL | Status: DC | PRN
  Administered 2016-08-01 – 2016-08-03 (×3): 100 mg via ORAL
  Filled 2016-07-30 (×3): qty 1

## 2016-07-30 MED ORDER — ONDANSETRON HCL 4 MG/2ML IJ SOLN: 4.0000 mg | Freq: Four times a day (QID) | INTRAMUSCULAR | Status: DC | PRN

## 2016-07-30 MED ORDER — ONDANSETRON HCL 4 MG PO TABS: 4.0000 mg | ORAL_TABLET | Freq: Four times a day (QID) | ORAL | Status: DC | PRN

## 2016-07-30 MED ORDER — ADULT MULTIVITAMIN W/MINERALS CH
1.0000 | ORAL_TABLET | Freq: Every day | ORAL | Status: DC
  Administered 2016-07-30 – 2016-08-06 (×8): 1 via ORAL
  Filled 2016-07-30 (×9): qty 1

## 2016-07-30 MED ORDER — FLUTICASONE PROPIONATE 50 MCG/ACT NA SUSP
2.0000 | Freq: Every day | NASAL | Status: DC
  Administered 2016-07-30 – 2016-08-06 (×7): 2 via NASAL
  Filled 2016-07-30: qty 16

## 2016-07-30 MED ORDER — ACETAMINOPHEN 650 MG RE SUPP
650.0000 mg | Freq: Four times a day (QID) | RECTAL | Status: DC | PRN
Start: 2016-07-30 — End: 2016-08-06

## 2016-07-30 MED ORDER — PANTOPRAZOLE SODIUM 40 MG PO TBEC
40.0000 mg | DELAYED_RELEASE_TABLET | Freq: Two times a day (BID) | ORAL | Status: DC
Start: 2016-07-30 — End: 2016-08-06
  Administered 2016-07-30 – 2016-08-06 (×14): 40 mg via ORAL
  Filled 2016-07-30 (×14): qty 1

## 2016-07-30 MED ORDER — RIFAXIMIN 550 MG PO TABS
550.0000 mg | ORAL_TABLET | Freq: Two times a day (BID) | ORAL | Status: DC
  Administered 2016-07-30 – 2016-08-06 (×14): 550 mg via ORAL
  Filled 2016-07-30 (×15): qty 1

## 2016-07-30 MED ORDER — LACTULOSE 10 GM/15ML PO SOLN
30.0000 g | Freq: Three times a day (TID) | ORAL | Status: DC
  Administered 2016-07-30 – 2016-08-04 (×14): 30 g via ORAL
  Filled 2016-07-30 (×14): qty 45

## 2016-07-30 MED ORDER — NICOTINE 21 MG/24HR TD PT24
21.0000 mg | MEDICATED_PATCH | Freq: Every day | TRANSDERMAL | Status: DC
  Administered 2016-07-30 – 2016-08-06 (×8): 21 mg via TRANSDERMAL
  Filled 2016-07-30 (×8): qty 1

## 2016-07-30 MED ORDER — SODIUM CHLORIDE 0.9 % IV SOLN
INTRAVENOUS | Status: DC
  Administered 2016-07-30 – 2016-07-31 (×2): via INTRAVENOUS

## 2016-07-30 MED ORDER — FOLIC ACID 1 MG PO TABS
1.0000 mg | ORAL_TABLET | Freq: Every day | ORAL | Status: DC
  Administered 2016-07-30 – 2016-08-06 (×8): 1 mg via ORAL
  Filled 2016-07-30 (×8): qty 1

## 2016-07-30 MED ORDER — ACETAMINOPHEN 325 MG PO TABS: 650.0000 mg | ORAL_TABLET | Freq: Four times a day (QID) | ORAL | Status: DC | PRN

## 2016-07-30 NOTE — H&P (Addendum)
History and Physical    DUTCH ING LPF:[id number] DOB: Mar 29, 1956 DOA: 07/30/2016  Referring MD/NP/PA: EDP PCP:  Patient coming from: Group home  Chief Complaint: weakness/nausea  HPI: Jose [name] is a 61 y.o. male with medical history significant of Hep C/Etoh/Cirrhosis, Hemochromatosis, chronic thrombocytopenia, recurrent hospitalizations with hepatic encephalopathy, noncompliance presents to the ER with the above complaints. Patient is a very poor historian, all he can tell me is that he has nausea and feels weaker than usual and hence came to the emergency room he currently lives in a group home by the name of:" [facility]",  he denies ongoing alcohol use reports that he drank a beer on Easter Sunday at his mother's home.  He denies any vomiting, no diarrhea, no fevers or chills .  ED Course: As noted to have profound hyponatremia sodium of 121 compared to normal baseline, potassium was 6.2 however specimen was hemolyzed and repeat labs,  BUN is 25, creatinine of 1.4. His bili is slightly elevated compared to baseline at 2.1 and AST/ALT are slightly higher than previous baseline too  Review of Systems: As per HPI otherwise 10 point review of systems negative.   Past Medical History:  Diagnosis Date  . Alcohol abuse   . Alcohol dependence ([place])   . Alcoholism ([place]) 07/12/2015  . Ascites   . Cirrhosis ([place])   . Depression   . Hepatitis C   . Hypertension   . QT prolongation   . Shortness of breath    with exertion  . Thrombocytopenia ([place])     Past Surgical History:  Procedure Laterality Date  . CIRCUMCISION    . COLONOSCOPY  march 2015   Dr. Renee Harder: nodular mucosa at appendiceal orifice, tubular adenoma, extremely poor prep  . COLONOSCOPY       has an unknown smoking status. He has never used smokeless tobacco. He reports that he drinks alcohol. He reports that he uses drugs, including Marijuana and Cocaine.  No Known Allergies  Family History  Problem  Relation Age of Onset  . Breast cancer Other   . Diabetes Other   . Hypertension Other   . Breast cancer Mother   . Hypertension Mother   . Diabetes Father   . Hypertension Sister   . Heart disease Sister   . Hypertension Paternal Grandmother   . Colon cancer Neg Hx      Prior to Admission medications   Medication Sig Start Date End Date Taking? Authorizing Provider  albuterol (PROVENTIL HFA;VENTOLIN HFA) 108 (90 Base) MCG/ACT inhaler Inhale 2 puffs into the lungs every 6 (six) hours as needed for wheezing or shortness of breath. 07/18/16   Eugenie Filler, MD  fluticasone (FLONASE) 50 MCG/ACT nasal spray Place 2 sprays into both nostrils daily.    Historical Provider, MD  folic acid (FOLVITE) 1 MG tablet Take 1 tablet (1 mg total) by mouth daily. 06/09/16   Shanker Kristeen Mans, MD  furosemide (LASIX) 40 MG tablet Take 1 tablet (40 mg total) by mouth daily. 06/09/16   Shanker Kristeen Mans, MD  KLOR-CON M20 20 MEQ tablet Take 1 tablet (20 mEq total) by mouth 2 (two) times daily. Patient taking differently: Take 20 mEq by mouth daily.  04/28/16   Annita Brod, MD  lactulose (CHRONULAC) 10 GM/15ML solution Take 45 mLs (30 g total) by mouth 3 (three) times daily. 07/18/16   Eugenie Filler, MD  Multiple Vitamin (MULTIVITAMIN) tablet Take 1 tablet by mouth daily.  Historical Provider, MD  nicotine (NICODERM CQ - DOSED IN MG/24 HOURS) 21 mg/24hr patch Place 1 patch (21 mg total) onto the skin daily. 07/18/16   Eugenie Filler, MD  omeprazole (PRILOSEC) 20 MG capsule Take 2 capsules (40 mg total) by mouth 2 (two) times daily before a meal. 07/18/16   Eugenie Filler, MD  ondansetron (ZOFRAN) 8 MG tablet Take 8 mg by mouth every 8 (eight) hours as needed for nausea or vomiting.    Historical Provider, MD  rifaximin (XIFAXAN) 550 MG TABS tablet Take 1 tablet (550 mg total) by mouth 2 (two) times daily. 07/18/16   Eugenie Filler, MD  spironolactone (ALDACTONE) 100 MG tablet Take 2 tablets (200  mg total) by mouth daily. 07/18/16   Eugenie Filler, MD  traZODone (DESYREL) 100 MG tablet Take 100 mg by mouth at bedtime as needed for sleep.    Historical Provider, MD    Physical Exam: Vitals:   07/30/16 1630 07/30/16 1645 07/30/16 1700 07/30/16 1715  BP: 124/73 (!) 143/89 136/73 (!) 138/59  Pulse:      Resp:      Temp:      TempSrc:      SpO2:           Vitals:   07/30/16 1630 07/30/16 1645 07/30/16 1700 07/30/16 1715  BP: 124/73 (!) 143/89 136/73 (!) 138/59  Pulse:      Resp:      Temp:      TempSrc:      SpO2:       Constitutional: Alert, awake, sitting up and on the bedside commode, oriented to self and place only Emucous membranes dry, poor dental hygiene Neck: normal, supple, no masses, no thyromegaly Respiratory: clear to auscultation bilaterally, no wheezing,  Cardiovascular: Regular rate and rhythm, no murmurs / rubs / gallops.   Abdomen: no tenderness, no masses palpated.  suspect mild fluid thrill . Bowel sounds positive.  Musculoskeletal: no clubbing / cyanosis. No joint deformity upper and lower extremities. Good ROM, no contractures. Normal muscle tone.  Skin: no rashes, lesions, ulcers. No induration Neurologic: CN 2-12 grossly intact. Sensation intact, DTR normal. Strength 5/5 in all 4 , asterixis noted ,  Psychiatric: Unable to assess  Labs on Admission: I have personally reviewed following labs and imaging studies  CBC:  Recent Labs Lab 07/30/16 1251  WBC 5.6  HGB 15.2  HCT 41.5  MCV 96.5  PLT 81*   Basic Metabolic Panel:  Recent Labs Lab 07/30/16 1251  NA 121*  K 6.2*  CL 95*  CO2 17*  GLUCOSE 117*  BUN 25*  CREATININE 1.40*  CALCIUM 8.5*   GFR: CrCl cannot be calculated (Unknown ideal weight.). Liver Function Tests:  Recent Labs Lab 07/30/16 1251  AST 135*  ALT 73*  ALKPHOS 271*  BILITOT 3.1*  PROT 8.3*  ALBUMIN 2.9*    Recent Labs Lab 07/30/16 1251  LIPASE 41    Recent Labs Lab 07/30/16 1531  AMMONIA  105*   Coagulation Profile: No results for input(s): INR, PROTIME in the last 168 hours. Cardiac Enzymes: No results for input(s): CKTOTAL, CKMB, CKMBINDEX, TROPONINI in the last 168 hours. BNP (last 3 results) No results for input(s): PROBNP in the last 8760 hours. HbA1C: No results for input(s): HGBA1C in the last 72 hours. CBG: No results for input(s): GLUCAP in the last 168 hours. Lipid Profile: No results for input(s): CHOL, HDL, LDLCALC, TRIG, CHOLHDL, LDLDIRECT in the last 72 hours.  Thyroid Function Tests: No results for input(s): TSH, T4TOTAL, FREET4, T3FREE, THYROIDAB in the last 72 hours. Anemia Panel: No results for input(s): VITAMINB12, FOLATE, FERRITIN, TIBC, IRON, RETICCTPCT in the last 72 hours. Urine analysis:    Component Value Date/Time   COLORURINE YELLOW 07/30/2016 [place] 07/30/2016 1549   LABSPEC 1.017 07/30/2016 1549   PHURINE 6.0 07/30/2016 1549   GLUCOSEU NEGATIVE 07/30/2016 1549   HGBUR MODERATE (A) 07/30/2016 1549   BILIRUBINUR NEGATIVE 07/30/2016 1549   KETONESUR NEGATIVE 07/30/2016 1549   PROTEINUR NEGATIVE 07/30/2016 1549   UROBILINOGEN 4.0 (H) 04/26/2013 0500   NITRITE NEGATIVE 07/30/2016 1549   LEUKOCYTESUR NEGATIVE 07/30/2016 1549   Sepsis Labs: _0 (procalcitonin:4,lacticidven:4) )No results found for this or any previous visit (from the past 240 hour(s)).   Radiological Exams on Admission: No results found.   Assessment/Plan    Metabolic encephalopathy -Multifactorial secondary to severe hyponatremia and hepatic encephalopathy  -resume lactulose, rifaximin -IVF see below -long history of noncompliance   Hyponatremia -Clinically appears dry, also hemoconcentrated hemoglobin kilograms higher than baseline -Patient is on diuretics but compliance is questionable slight clear if he's been taking this regularly -Check urine sodium and osmolarity -challenge with gentle hydration with normal saline check B met  every 6 hours    ARF (acute renal failure) (HCC) -Clinically appears dry, hydrate and monitor    Hyperkalemia -Specimen was hemolyzed, await repeat labs    Alcoholic cirrhosis of liver with ascites ([place]) -History portal hypertension/cirrhosis  -Hold diuretics in the setting of dehydration/hyponatremia/AKI     Protein-calorie malnutrition, severe (HCC)    Hemochromatosis -Follow-up with Dr. Alvy Bimler    Hepatitis C, chronic ([place])   -FU as outpatient  DVT prophylaxis: SCDs Code Status: Full Code  Family Communication: None at bedside  Disposition Plan: admit to SDU Consults called: none  Admission status: inpatient   Domenic Polite MD [facility] Pager [phone]  If 7PM-7AM, please contact night-coverage [email or web address] Password [facility]  07/30/2016, 5:35 PM

## 2016-07-30 NOTE — ED Notes (Signed)
Per admitting provider, will decrease pt fluid infusion rate to 75 ml/hr. Rate dose changed verbally confirmed and witnessed by provider.

## 2016-07-30 NOTE — ED Notes (Signed)
Attempted report x 2, unable to reach anyone at nursing station.

## 2016-07-30 NOTE — ED Notes (Addendum)
Pt states, "I have to use the restroom", this nurse attempted to help pt urinate in urinal but pt unable. Pt attempted to have a BM approx 30 min ago on bedside commode but was unable. When this nurse asked pt if he was having trouble having a BM he reports he "just can't right now"

## 2016-07-30 NOTE — ED Triage Notes (Signed)
Patient arrived by [facility] for persistent nausea. Patient appears sleepy on arrival but denies drug use. Oriented to person and place. denies vomiting, states that he takes his HIV meds daily

## 2016-07-30 NOTE — ED Notes (Signed)
Admitting provider at bedside.

## 2016-07-30 NOTE — ED Provider Notes (Signed)
[facility] DEPT Provider Note   CSN: [id number] Arrival date & time: 07/30/16  1241     History   Chief Complaint Chief Complaint  Patient presents with  . Nausea    HPI Jose [name] is a 61 y.o. male.  HPI Patient reports he's been having increased nausea and imbalance. He denies he's had actual vomiting. He has not had fever. He denies specific abdominal pain. He does however have cirrhosis and chronic abdominal distention. Patient denies recent use of alcohol or drug. Past Medical History:  Diagnosis Date  . Alcohol abuse   . Alcohol dependence ([place])   . Alcoholism ([place]) 07/12/2015  . Ascites   . Cirrhosis ([place])   . Depression   . Hepatitis C   . Hypertension   . QT prolongation   . Shortness of breath    with exertion  . Thrombocytopenia [facility])     Patient Active Problem List   Diagnosis Date Noted  . Hyponatremia 07/30/2016  . Hyperglycemia, unspecified   . Chronic hepatitis C without hepatic coma ([place]) 06/06/2016  . Elevated lactic acid level 05/10/2016  . Acute hepatic encephalopathy 04/26/2016  . 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 Hepatitis C, chronic ([place]) 01/04/2016  . Hernia, inguinal, right 09/12/2015  . Essential hypertension 09/12/2015  . Gastroesophageal reflux disease without esophagitis 09/12/2015  . Protein-calorie malnutrition, severe ([place]) 09/12/2015  . Anemia of chronic disease 09/12/2015  . Arthritis of left knee 09/12/2015  . Cirrhosis of liver with ascites ([place])   . Elevated LFTs   . Weakness 09/03/2015  . Generalized weakness   . Physical deconditioning   . Acute-on-chronic renal failure ([place])   . Alcoholic cirrhosis of liver with ascites ([place]) 07/25/2015  . Hypokalemia 07/23/2015  . Macrocytic anemia 07/23/2015  . Alcoholism ([place]) 07/12/2015  . ARF (acute renal failure) ([place]) 06/27/2015  . Metabolic encephalopathy 66/44/0347  . Trichomonas infection 06/26/2015  . 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 Adenomatous polyps 10/18/2013  .  Unspecified vitamin D deficiency 07/13/2013  . Atopic dermatitis 04/28/2013  . Thrombocytopenia ([place]) 04/28/2013  . Anasarca 04/26/2013  . UTI (urinary tract infection) 04/26/2013    Past Surgical History:  Procedure Laterality Date  . CIRCUMCISION    . COLONOSCOPY  march 2015   Dr. Renee Harder: nodular mucosa at appendiceal orifice, tubular adenoma, extremely poor prep  . COLONOSCOPY         Home Medications    Prior to Admission medications   Medication Sig Start Date End Date Taking? Authorizing Provider  albuterol (PROVENTIL HFA;VENTOLIN HFA) 108 (90 Base) MCG/ACT inhaler Inhale 2 puffs into the lungs every 6 (six) hours as needed for wheezing or shortness of breath. 07/18/16  Yes Eugenie Filler, MD  fluticasone (FLONASE) 50 MCG/ACT nasal spray Place 2 sprays into both nostrils daily.    Historical Provider, MD  folic acid (FOLVITE) 1 MG tablet Take 1 tablet (1 mg total) by mouth daily. 06/09/16   Shanker Kristeen Mans, MD  furosemide (LASIX) 40 MG tablet Take 1 tablet (40 mg total) by mouth daily. 06/09/16   Shanker Kristeen Mans, MD  KLOR-CON M20 20 MEQ tablet Take 1 tablet (20 mEq total) by mouth 2 (two) times daily. Patient taking differently: Take 20 mEq by mouth daily.  04/28/16   Annita Brod, MD  lactulose (CHRONULAC) 10 GM/15ML solution Take 45 mLs (30 g total) by mouth 3 (three) times daily. 07/18/16   Eugenie Filler, MD  Multiple Vitamin (MULTIVITAMIN) tablet Take 1 tablet  by mouth daily.    Historical Provider, MD  nicotine (NICODERM CQ - DOSED IN MG/24 HOURS) 21 mg/24hr patch Place 1 patch (21 mg total) onto the skin daily. 07/18/16   Eugenie Filler, MD  omeprazole (PRILOSEC) 20 MG capsule Take 2 capsules (40 mg total) by mouth 2 (two) times daily before a meal. 07/18/16   Eugenie Filler, MD  ondansetron (ZOFRAN) 8 MG tablet Take 8 mg by mouth every 8 (eight) hours as needed for nausea or vomiting.    Historical Provider, MD  rifaximin (XIFAXAN) 550 MG TABS tablet  Take 1 tablet (550 mg total) by mouth 2 (two) times daily. 07/18/16   Eugenie Filler, MD  spironolactone (ALDACTONE) 100 MG tablet Take 2 tablets (200 mg total) by mouth daily. 07/18/16   Eugenie Filler, MD  traZODone (DESYREL) 100 MG tablet Take 100 mg by mouth at bedtime as needed for sleep.    Historical Provider, MD    Family History Family History  Problem Relation Age of Onset  . Breast cancer Other   . Diabetes Other   . Hypertension Other   . Breast cancer Mother   . Hypertension Mother   . Diabetes Father   . Hypertension Sister   . Heart disease Sister   . Hypertension Paternal Grandmother   . Colon cancer Neg Hx     Social History Social History  Substance Use Topics  . Smoking status: Unknown If Ever Smoked  . Smokeless tobacco: Never Used  . Alcohol use Yes     Comment: A couple beers every few days      Allergies   Patient has no known allergies.   Review of Systems Review of Systems 10 Systems reviewed and are negative for acute change except as noted in the HPI.   Physical Exam Updated Vital Signs BP 139/79   Pulse (!) 56   Temp 98.2 F (36.8 C) (Oral)   Resp 18   SpO2 98%   Physical Exam  Constitutional:  Patient is deconditioned appearance.He appears slightly confused but is answering questions and no signs of respiratory distress.  HENT:  Head: Normocephalic and atraumatic.  Oropharynx slightly dry.  Eyes: EOM are normal.  Cardiovascular: Normal rate, regular rhythm, normal heart sounds and intact distal pulses.   Pulmonary/Chest: Effort normal.  Final rail at bases.  Abdominal:  Abdomen is distended but nontender.  Musculoskeletal:  Trace pitting edema bilaterally. Skin thinning.  Neurological:  Patient is awake and conversant but slightly confused. No focal neurologic deficit.  Skin: Skin is warm and dry.  Psychiatric: He has a normal mood and affect.     ED Treatments / Results  Labs (all labs ordered are listed, but  only abnormal results are displayed) Labs Reviewed  COMPREHENSIVE METABOLIC PANEL - Abnormal; Notable for the following:       Result Value   Sodium 121 (*)    Potassium 6.2 (*)    Chloride 95 (*)    CO2 17 (*)    Glucose, Bld 117 (*)    BUN 25 (*)    Creatinine, Ser 1.40 (*)    Calcium 8.5 (*)    Total Protein 8.3 (*)    Albumin 2.9 (*)    AST 135 (*)    ALT 73 (*)    Alkaline Phosphatase 271 (*)    Total Bilirubin 3.1 (*)    GFR calc non Af Amer 53 (*)    All other components within normal  limits  CBC - Abnormal; Notable for the following:    MCH 35.3 (*)    MCHC 36.6 (*)    Platelets 81 (*)    All other components within normal limits  URINALYSIS, ROUTINE W REFLEX MICROSCOPIC - Abnormal; Notable for the following:    Hgb urine dipstick MODERATE (*)    Squamous Epithelial / LPF 0-5 (*)    All other components within normal limits  AMMONIA - Abnormal; Notable for the following:    Ammonia 105 (*)    All other components within normal limits  LIPASE, BLOOD  RAPID URINE DRUG SCREEN, HOSP PERFORMED  SODIUM, URINE, RANDOM  OSMOLALITY, URINE  ETHANOL  BASIC METABOLIC PANEL  BASIC METABOLIC PANEL  BASIC METABOLIC PANEL  I-STAT CG4 LACTIC ACID, ED  I-STAT TROPOININ, ED    EKG  EKG Interpretation None       Radiology No results found.  Procedures Procedures (including critical care time)  Medications Ordered in ED Medications  0.9 %  sodium chloride infusion ( Intravenous Rate/Dose Change 07/30/16 1736)     Initial Impression / Assessment and Plan / ED Course  I have reviewed the triage vital signs and the nursing notes.  Pertinent labs & imaging results that were available during my care of the patient were reviewed by me and considered in my medical decision making (see chart for details).     Consults:(17:12) [facility] for admission  Final Clinical Impressions(s) / ED Diagnoses   Final diagnoses:  Hepatic encephalopathy ([place])  Hyponatremia   Alcoholic cirrhosis of liver with ascites ([place])  Patient was generally worsening baseline condition. Increased nausea, somnolence and imbalance. Patient has cirrhotic liver failure now with significant hyponatremia and findings consistent with hepatic encephalopathy.  New Prescriptions New Prescriptions   No medications on file     Charlesetta Shanks, MD 07/30/16 [phone]

## 2016-07-30 NOTE — ED Notes (Signed)
Pt reports his last drink was 1 beer on Sunday 07/27/16. Pt reports he has not been drinking for 2 months.

## 2016-07-30 NOTE — ED Notes (Signed)
Attempted report x1. 

## 2016-07-31 DIAGNOSIS — N179 Acute kidney failure, unspecified: Secondary | ICD-10-CM

## 2016-07-31 DIAGNOSIS — K7290 Hepatic failure, unspecified without coma: Secondary | ICD-10-CM

## 2016-07-31 LAB — CBC
HCT: 37.2 % — ABNORMAL LOW (ref 39.0–52.0)
Hemoglobin: 13.3 g/dL (ref 13.0–17.0)
MCH: 34.6 pg — ABNORMAL HIGH (ref 26.0–34.0)
MCHC: 35.8 g/dL (ref 30.0–36.0)
MCV: 96.9 fL (ref 78.0–100.0)
PLATELETS: 68 10*3/uL — AB (ref 150–400)
RBC: 3.84 MIL/uL — AB (ref 4.22–5.81)
RDW: 13.3 % (ref 11.5–15.5)
WBC: 3.9 10*3/uL — AB (ref 4.0–10.5)

## 2016-07-31 LAB — COMPREHENSIVE METABOLIC PANEL
ALT: 64 U/L — AB (ref 17–63)
AST: 99 U/L — AB (ref 15–41)
Albumin: 2.4 g/dL — ABNORMAL LOW (ref 3.5–5.0)
Alkaline Phosphatase: 227 U/L — ABNORMAL HIGH (ref 38–126)
Anion gap: 7 (ref 5–15)
BUN: 23 mg/dL — AB (ref 6–20)
CHLORIDE: 99 mmol/L — AB (ref 101–111)
CO2: 17 mmol/L — AB (ref 22–32)
CREATININE: 1.04 mg/dL (ref 0.61–1.24)
Calcium: 7.9 mg/dL — ABNORMAL LOW (ref 8.9–10.3)
GFR calc Af Amer: >60 mL/min (ref >60)
GFR calc non Af Amer: >60 mL/min (ref >60)
Glucose, Bld: 128 mg/dL — ABNORMAL HIGH (ref 65–99)
POTASSIUM: 4.7 mmol/L (ref 3.5–5.1)
SODIUM: 123 mmol/L — AB (ref 135–145)
Total Bilirubin: 2.4 mg/dL — ABNORMAL HIGH (ref 0.3–1.2)
Total Protein: 6.8 g/dL (ref 6.5–8.1)

## 2016-07-31 LAB — BASIC METABOLIC PANEL
ANION GAP: 8 (ref 5–15)
Anion gap: 10 (ref 5–15)
Anion gap: 7 (ref 5–15)
BUN: 25 mg/dL — ABNORMAL HIGH (ref 6–20)
BUN: 20 mg/dL (ref 6–20)
BUN: 19 mg/dL (ref 6–20)
CALCIUM: 7.7 mg/dL — AB (ref 8.9–10.3)
CHLORIDE: 100 mmol/L — AB (ref 101–111)
CHLORIDE: 100 mmol/L — AB (ref 101–111)
CO2: 15 mmol/L — ABNORMAL LOW (ref 22–32)
CO2: 15 mmol/L — ABNORMAL LOW (ref 22–32)
CO2: 17 mmol/L — AB (ref 22–32)
CREATININE: 0.96 mg/dL (ref 0.61–1.24)
CREATININE: 1.11 mg/dL (ref 0.61–1.24)
Calcium: 7.9 mg/dL — ABNORMAL LOW (ref 8.9–10.3)
Calcium: 7.9 mg/dL — ABNORMAL LOW (ref 8.9–10.3)
Chloride: 95 mmol/L — ABNORMAL LOW (ref 101–111)
Creatinine, Ser: 1.01 mg/dL (ref 0.61–1.24)
GFR calc non Af Amer: >60 mL/min (ref >60)
GFR calc non Af Amer: >60 mL/min (ref >60)
GFR calc non Af Amer: >60 mL/min (ref >60)
Glucose, Bld: 130 mg/dL — ABNORMAL HIGH (ref 65–99)
Glucose, Bld: 129 mg/dL — ABNORMAL HIGH (ref 65–99)
Glucose, Bld: 120 mg/dL — ABNORMAL HIGH (ref 65–99)
POTASSIUM: 4.8 mmol/L (ref 3.5–5.1)
POTASSIUM: 5.2 mmol/L — AB (ref 3.5–5.1)
Potassium: 4 mmol/L (ref 3.5–5.1)
SODIUM: 120 mmol/L — AB (ref 135–145)
Sodium: 124 mmol/L — ABNORMAL LOW (ref 135–145)
Sodium: 123 mmol/L — ABNORMAL LOW (ref 135–145)

## 2016-07-31 LAB — MRSA PCR SCREENING: MRSA by PCR: NEGATIVE

## 2016-07-31 MED ORDER — FUROSEMIDE 40 MG PO TABS
40.0000 mg | ORAL_TABLET | Freq: Every day | ORAL | Status: DC
  Administered 2016-07-31 – 2016-08-01 (×2): 40 mg via ORAL
  Filled 2016-07-31 (×2): qty 1

## 2016-07-31 NOTE — Progress Notes (Signed)
Pt arrived on unit. Pt made comfortable and callbell within reach.

## 2016-07-31 NOTE — Progress Notes (Signed)
Responded to Spiritual Care Consult to assist with Advance Directives.  Patient wants to  wait on sister to help with document.  Patient will take  AD home to complete and will call for appointment or will wait until follow up visit.  If sister comes to visit nurse will page chaplain.

## 2016-07-31 NOTE — Progress Notes (Signed)
PROGRESS NOTE    Jose [name]  GZF:[id number] DOB: [birth year] DOA: 07/30/2016 PCP: [name], MD   Brief Narrative: 61 y.o. male with medical history significant of Hep C/Etoh/Cirrhosis, Hemochromatosis, chronic thrombocytopenia, recurrent hospitalizations with hepatic encephalopathy, noncompliance presents to the ER with nausea, weakness. He currently lives in a group home by the name of:" [facility]",  he denies ongoing alcohol use reports that he drank a beer on Easter "Sunday at his mother's home. He denies any vomiting, no diarrhea, no fevers or chills . In the ER, he was found to have hyponatremia sodium of 121, potassium was 6.2 however specimen was hemolyzed and repeat labs,  BUN is 25, creatinine of 1.4.  Assessment & Plan:   # Acute hepatitic encephalopathy: -Patient has ammonia level of 127 on admission. -Continue lactulose, Xifaxan. -Mentally status improving. Continue to monitor.  #Hyponatremia likely subacute in the setting of liver cirrhosis: -No significant improvement in serum sodium level with normal saline. Urine osmolality is elevated with elevated urine sodium level. I will discontinue normal saline and start oral Lasix. We will do fluid restriction to 1500 mL a day. Check BMP twice a day. Avoid sudden improvement in serum sodium level. Continue to monitor electrolytes. -May need to resume Aldactone depending on further lab tests.  #Hyperkalemia: Serum potassium level improved.  #Acute kidney injury: Serum creatinine level improved. Continue to monitor.  #Alcohol liver cirrhosis with ascites: Resume oral Lasix. Continue Xifaxan and lactulose. Continue vitamins.  #Severe protein calorie malnutrition: Dietary referral, chest regular diet.  #History of hemochromatosis: Follows up with Dr. Gorsuch  #History of hepatitis C, chronic: Recommended outpatient follow-up.   #Chronic thrombocytopenia: Platelet level stable. No sign of bleeding. Continue to  monitor.  PT, OT evaluation. DVT prophylaxis: SCD. Patient has thrombocytopenia Code Status: Full code Family Communication: No family present at bedside Disposition Plan: Likely discharge to group home in 1-2 days    Consultants:   None  Procedures: None Antimicrobials: None  Subjective: Patient was seen and examined at bedside. Patient was alert awake and oriented. Reported feeling better. Finish his breakfast. Denied nausea vomiting, abdominal pain, chest pain or shortness of breath.   Objective: Vitals:   07/31/16 0343 07/31/16 0700 07/31/16 0828 07/31/16 1256  BP: 121/79  95/61 133/80  Pulse: 66 (!) 57 68 62  Resp: 20 15 (!) 23 15  Temp: 97.8 F (36.6 C)  98 F (36.7 C) 97.7 F (36.5 C)  TempSrc: Axillary  Oral Oral  SpO2: 99% 98%  96%    Intake/Output Summary (Last 24 hours) at 07/31/16 1406 Last data filed at 07/31/16 1300  Gross per 24 hour  Intake             2070 ml  Output             1100 ml  Net              97" 0 ml   There were no vitals filed for this visit.  Examination:  General exam: Appears calm and comfortable  Respiratory system: Clear to auscultation. Respiratory effort normal. No wheezing or crackle Cardiovascular system: S1 & S2 heard, RRR.  No pedal edema. Gastrointestinal system: Abdomen soft, nontender, bowel sounds positive. Mild distention. Central nervous system: Alert and oriented. No focal neurological deficits. Extremities: Symmetric 5 x 5 power. Skin: No rashes, lesions or ulcers Psychiatry: Judgement and insight appear normal. Mood & affect appropriate.     Data Reviewed: I have personally reviewed following labs  and imaging studies  CBC:  Recent Labs Lab 07/30/16 1251 07/31/16 0622  WBC 5.6 3.9*  HGB 15.2 13.3  HCT 41.5 37.2*  MCV 96.5 96.9  PLT 81* 68*   Basic Metabolic Panel:  Recent Labs Lab 07/30/16 1251 07/30/16 1746 07/30/16 2347 07/31/16 0622 07/31/16 1150  NA 121* 120* 120* 123* 124*  K 6.2*  5.2* 4.0 4.7 4.8  CL 95* 94* 95* 99* 100*  CO2 17* 15* 15* 17* 17*  GLUCOSE 117* 100* 130* 128* 129*  BUN 25* 25* 25* 23* 20  CREATININE 1.40* 1.30* 1.11 1.04 0.96  CALCIUM 8.5* 8.1* 7.7* 7.9* 7.9*   GFR: CrCl cannot be calculated (Unknown ideal weight.). Liver Function Tests:  Recent Labs Lab 07/30/16 1251 07/31/16 0622  AST 135* 99*  ALT 73* 64*  ALKPHOS 271* 227*  BILITOT 3.1* 2.4*  PROT 8.3* 6.8  ALBUMIN 2.9* 2.4*    Recent Labs Lab 07/30/16 1251  LIPASE 41    Recent Labs Lab 07/30/16 1531  AMMONIA 105*   Coagulation Profile: No results for input(s): INR, PROTIME in the last 168 hours. Cardiac Enzymes: No results for input(s): CKTOTAL, CKMB, CKMBINDEX, TROPONINI in the last 168 hours. BNP (last 3 results) No results for input(s): PROBNP in the last 8760 hours. HbA1C: No results for input(s): HGBA1C in the last 72 hours. CBG: No results for input(s): GLUCAP in the last 168 hours. Lipid Profile: No results for input(s): CHOL, HDL, LDLCALC, TRIG, CHOLHDL, LDLDIRECT in the last 72 hours. Thyroid Function Tests: No results for input(s): TSH, T4TOTAL, FREET4, T3FREE, THYROIDAB in the last 72 hours. Anemia Panel: No results for input(s): VITAMINB12, FOLATE, FERRITIN, TIBC, IRON, RETICCTPCT in the last 72 hours. Sepsis Labs:  Recent Labs Lab 07/30/16 1557  LATICACIDVEN 1.75    Recent Results (from the past 240 hour(s))  MRSA PCR Screening     Status: None   Collection Time: 07/30/16  8:57 PM  Result Value Ref Range Status   MRSA by PCR NEGATIVE NEGATIVE Final    Comment:        The GeneXpert MRSA Assay (FDA approved for NASAL specimens only), is one component of a comprehensive MRSA colonization surveillance program. It is not intended to diagnose MRSA infection nor to guide or monitor treatment for MRSA infections.          Radiology Studies: No results found.      Scheduled Meds: . fluticasone  2 spray Each Nare Daily  . folic  acid  1 mg Oral Daily  . lactulose  30 g Oral TID  . multivitamin with minerals  1 tablet Oral Daily  . nicotine  21 mg Transdermal Daily  . pantoprazole  40 mg Oral BID  . rifaximin  550 mg Oral BID   Continuous Infusions:   LOS: 1 day    Gwendolyn Mclees Tanna Furry, MD [facility] Pager [phone]  If 7PM-7AM, please contact night-coverage [email or web address] Password [email or web address] 07/31/2016, 2:06 PM

## 2016-08-01 DIAGNOSIS — K7031 Alcoholic cirrhosis of liver with ascites: Secondary | ICD-10-CM

## 2016-08-01 LAB — BASIC METABOLIC PANEL
ANION GAP: 8 (ref 5–15)
Anion gap: 8 (ref 5–15)
Anion gap: 6 (ref 5–15)
BUN: 14 mg/dL (ref 6–20)
BUN: 15 mg/dL (ref 6–20)
BUN: 12 mg/dL (ref 6–20)
CALCIUM: 7.9 mg/dL — AB (ref 8.9–10.3)
CALCIUM: 7.6 mg/dL — AB (ref 8.9–10.3)
CALCIUM: 7.7 mg/dL — AB (ref 8.9–10.3)
CO2: 18 mmol/L — ABNORMAL LOW (ref 22–32)
CO2: 15 mmol/L — ABNORMAL LOW (ref 22–32)
CO2: 19 mmol/L — AB (ref 22–32)
Chloride: 99 mmol/L — ABNORMAL LOW (ref 101–111)
Chloride: 100 mmol/L — ABNORMAL LOW (ref 101–111)
Chloride: 101 mmol/L (ref 101–111)
Creatinine, Ser: 0.89 mg/dL (ref 0.61–1.24)
Creatinine, Ser: 0.87 mg/dL (ref 0.61–1.24)
Creatinine, Ser: 0.86 mg/dL (ref 0.61–1.24)
GFR calc Af Amer: >60 mL/min (ref >60)
GFR calc Af Amer: >60 mL/min (ref >60)
GLUCOSE: 137 mg/dL — AB (ref 65–99)
GLUCOSE: 131 mg/dL — AB (ref 65–99)
Glucose, Bld: 113 mg/dL — ABNORMAL HIGH (ref 65–99)
POTASSIUM: 5.8 mmol/L — AB (ref 3.5–5.1)
POTASSIUM: 4.2 mmol/L (ref 3.5–5.1)
Potassium: 4 mmol/L (ref 3.5–5.1)
Sodium: 125 mmol/L — ABNORMAL LOW (ref 135–145)
Sodium: 123 mmol/L — ABNORMAL LOW (ref 135–145)
Sodium: 126 mmol/L — ABNORMAL LOW (ref 135–145)

## 2016-08-01 MED ORDER — SPIRONOLACTONE 25 MG PO TABS
25.0000 mg | ORAL_TABLET | Freq: Every day | ORAL | Status: DC
  Administered 2016-08-01: 25 mg via ORAL
  Filled 2016-08-01: qty 1

## 2016-08-01 MED ORDER — FUROSEMIDE 10 MG/ML IJ SOLN
40.0000 mg | Freq: Two times a day (BID) | INTRAMUSCULAR | Status: DC
  Administered 2016-08-01: 40 mg via INTRAVENOUS
  Filled 2016-08-01: qty 4

## 2016-08-01 NOTE — Evaluation (Signed)
Physical Therapy Evaluation Patient Details Name: Jose [name] MRN: [id number] DOB: 02-Aug-1955 Today's Date: 08/01/2016   History of Present Illness  61 y.o. male with medical history significant of Hep C/Etoh/Cirrhosis, Hemochromatosis, chronic thrombocytopenia, recurrent hospitalizations with hepatic encephalopathy who presented to the ED with c/o weakness and nausea.  Clinical Impression  Pt admitted with above diagnosis. Pt currently with functional limitations due to the deficits listed below (see PT Problem List). Pt wasable to ambulate with and without RW with min to min guard assist.  If he can use device at group home, pt should be able to go there with HHPT.  IF its not conducive to use of RW, May need SNF.   Pt will benefit from skilled PT to increase their independence and safety with mobility to allow discharge to the venue listed below.      Follow Up Recommendations Home health PT;Supervision/Assistance - 24 hour    Equipment Recommendations  Rolling walker with 5" wheels;3in1 (PT) (pt states he has a RW but PT unsure if this is reliable)    Recommendations for Other Services       Precautions / Restrictions Precautions Precautions: Fall Restrictions Weight Bearing Restrictions: No      Mobility  Bed Mobility Overal bed mobility: Needs Assistance Bed Mobility: Supine to Sit;Sit to Supine     Supine to sit: Supervision;HOB elevated Sit to supine: Supervision   General bed mobility comments: for safety; no physical assist.  Transfers Overall transfer level: Needs assistance Equipment used: Rolling walker (2 wheeled);None Transfers: Sit to/from Stand Sit to Stand: Min guard         General transfer comment: no physical assist, min guard for safety. Mildly unsteady in standing but no LOB  Ambulation/Gait Ambulation/Gait assistance: Min guard;Min assist Ambulation Distance (Feet): 250 Feet Assistive device: Rolling walker (2 wheeled);None Gait  Pattern/deviations: Step-through pattern;Decreased stride length;Trunk flexed;Wide base of support;Drifts right/left;Staggering left;Staggering right   Gait velocity interpretation: <1.8 ft/sec, indicative of risk for recurrent falls General Gait Details: Pt slightly unsteady without RW reaching for furniture at times.   Pt also rushing to get to bathroom and began urinating before he got to bathroom.  Cleaned pt and changed all linen.  Pt then ambulated in hallway with rW and is overall safer with RW but still needed cues to stay close to RW as at times he let RW get too far out in front of him especially with turns.  Pt wanted to return to bed as he sat up earlier per pt.   Stairs            Wheelchair Mobility    Modified Rankin (Stroke Patients Only)       Balance Overall balance assessment: Needs assistance;History of Falls Sitting-balance support: Feet supported;No upper extremity supported Sitting balance-Leahy Scale: Good     Standing balance support: During functional activity;No upper extremity supported Standing balance-Leahy Scale: Fair Standing balance comment: Pt was able to stand statically to clean himself with slightly widened BOS and kept balance. Cannot accept challenges to balance however.                              Pertinent Vitals/Pain Pain Assessment: No/denies pain    Home Living Family/patient expects to be discharged to:: Group home Living Arrangements: Other (Comment);Group Home (lives with 4-5 other people)   Type of Home: Apartment Home Access: Level entry     Home Layout: One  level Home Equipment: Clinical cytogeneticist - 2 wheels;Cane - single point      Prior Function Level of Independence: Independent         Comments: uses shower chair for bathing     Hand Dominance   Dominant Hand: Right    Extremity/Trunk Assessment   Upper Extremity Assessment Upper Extremity Assessment: Defer to OT evaluation    Lower  Extremity Assessment Lower Extremity Assessment: Generalized weakness    Cervical / Trunk Assessment Cervical / Trunk Assessment: Normal  Communication   Communication: No difficulties  Cognition Arousal/Alertness: Awake/alert Behavior During Therapy: WFL for tasks assessed/performed Overall Cognitive Status: Impaired/Different from baseline Area of Impairment: Following commands;Safety/judgement;Problem solving                       Following Commands: Follows one step commands inconsistently;Follows one step commands with increased time Safety/Judgement: Decreased awareness of safety;Decreased awareness of deficits   Problem Solving: Difficulty sequencing;Requires verbal cues;Requires tactile cues        General Comments      Exercises     Assessment/Plan    PT Assessment Patient needs continued PT services  PT Problem List Decreased activity tolerance;Decreased balance;Decreased mobility;Decreased knowledge of use of DME;Decreased coordination;Decreased safety awareness;Decreased knowledge of precautions       PT Treatment Interventions DME instruction;Gait training;Functional mobility training;Therapeutic activities;Therapeutic exercise;Balance training;Patient/family education;Stair training    PT Goals (Current goals can be found in the Care Plan section)  Acute Rehab PT Goals Patient Stated Goal: get stronger PT Goal Formulation: With patient Time For Goal Achievement: 08/15/16 Potential to Achieve Goals: Good    Frequency Min 3X/week   Barriers to discharge Decreased caregiver support      Co-evaluation               End of Session Equipment Utilized During Treatment: Gait belt Activity Tolerance: Patient limited by fatigue Patient left: with call bell/phone within reach;in bed;with bed alarm set Nurse Communication: Mobility status PT Visit Diagnosis: Unsteadiness on feet (R26.81);Muscle weakness (generalized) (M62.81)    Time:  1212-1229 PT Time Calculation (min) (ACUTE ONLY): 17 min   Charges:   PT Evaluation $PT Eval Moderate Complexity: 1 Procedure     PT G Codes:        Marielis Samara,PT Acute Rehabilitation [phone]  Denice Paradise 08/01/2016, 2:19 PM

## 2016-08-01 NOTE — Progress Notes (Signed)
Return visit per family to continue discussion regarding advance Directives.  Patient does not fully understand what an AD is and he and his sister want to take document home. They will let [facility] Care know when they are ready. I went over form with the both of them. Chaplain available as needed.

## 2016-08-01 NOTE — Progress Notes (Signed)
PROGRESS NOTE    Jose [name]  XAJ:[id number] DOB: [birth year] DOA: 07/30/2016 PCP: [name], MD   Brief Narrative: 61 y.o. male with medical history significant of Hep C/Etoh/Cirrhosis, Hemochromatosis, chronic thrombocytopenia, recurrent hospitalizations with hepatic encephalopathy, noncompliance presents to the ER with nausea, weakness. He currently lives in a group [facility] name of:" [facility]",  he denies ongoing alcohol use reports that he drank a beer on Easter Sunday at his mother\'s home. He denies any vomiting, no diarrhea, no fevers or chills . In the ER, he was found to have hyponatremia sodium of 121, potassium was 6.2 however specimen was hemolyzed and repeat labs,  BUN is 25, creatinine of 1.4.  Assessment & Plan:   # Acute hepatitic encephalopathy: -Patient has ammonia level of 127 on admission. -Continue lactulose, Xifaxan. -Mentally status improving. Continue to monitor. -Pt/Ot eval  #Hyponatremia likely subacute in the setting of liver cirrhosis: -No significant improvement in serum sodium level with normal saline. Urine osmolality is elevated with elevated urine sodium level.DC NS and change fluid restriction to 1200 cc. Continue lasix and resume aldactone.  Avoid sudden improvement in serum sodium level. Continue to monitor electrolytes. Repeat BMP at noon.    #Hyperkalemia: Serum potassium level improved.  #Acute kidney injury: Serum creatinine level improved. Continue to monitor.  #Alcohol liver cirrhosis with ascites: Continue lasix, aldactone Xifaxan and lactulose. Continue vitamins.  #Severe protein calorie malnutrition: Dietary referral,  regular diet. Patient has good oral intake.  #History of hemochromatosis: Follows up with Dr. Gorsuch  #History of hepatitis C, chronic: Recommended outpatient follow-up.   #Chronic thrombocytopenia: Platelet level stable. No sign of bleeding. Continue to monitor.  PT, OT evaluation. DVT prophylaxis:  SCD. Patient has thrombocytopenia Code Status: Full code Family Communication: Patient\'s sister at bedside Disposition Plan: Likely discharge to group home in 1-2 days    Consultants:   None  Procedures: None Antimicrobials: None  Subjective: Patient was seen and examined at bedside. Denied headache, dizziness, nausea, vomiting, chest pain or shortness of breath.   Objective: Vitals:   07/31/16 1857 07/31/16 1900 07/31/16 2252 08/01/16 0627  BP:  126/80 (!) 141/78 128/72  Pulse:  62 71 65  Resp:  18 17 17  Temp:  98.4 F (36.9 C) 98.6 F (37 C) 98.1 F (36.7 C)  TempSrc:   Oral Axillary  SpO2:  100% 100% 100%  Weight: 83.6 kg (184 lb 4.8 oz)     Height: 5\' 6" (1.676 m)       Intake/Output Summary (Last 24 hours) at 08/01/16 1158 Last data filed at 08/01/16 0840  Gross per 24 hour  Intake              665 ml  Output             14 75 ml  Net             -810 ml   Filed Weights   07/31/16 1857  Weight: 83.6 kg (184 lb 4.8 oz)    Examination:  General exam: Not in distress sitting on chair comfortable Respiratory system: Clear bilateral. Respiratory effort normal. No wheezing or crackle Cardiovascular system: S1 & S2 heard, RRR.  No pedal edema. Gastrointestinal system: Abdomen soft, nontender, bowel sounds positive. Mild distention. Unchanged Central nervous system: Alert and oriented. No focal neurological deficits. Extremities: Symmetric 5 x 5 power. Skin: No rashes, lesions or ulcers Psychiatry: Judgement and insight appear normal. Mood & affect appropriate.  Data Reviewed: I have personally reviewed following labs and imaging studies  CBC:  Recent Labs Lab 07/30/16 1251 07/31/16 0622  WBC 5.6 3.9*  HGB 15.2 13.3  HCT 41.5 37.2*  MCV 96.5 96.9  PLT 81* 68*   Basic Metabolic Panel:  Recent Labs Lab 07/30/16 2347 07/31/16 0622 07/31/16 1150 07/31/16 1710 08/01/16 0444  NA 120* 123* 124* 123* 125*  K 4.0 4.7 4.8 5.2* 4.0  CL 95* 99*  100* 100* 99*  CO2 15* 17* 17* 15* 18*  GLUCOSE 130* 128* 129* 120* 113*  BUN 25* 23* 20 19 14   CREATININE 1.11 1.04 0.96 1.01 0.89  CALCIUM 7.7* 7.9* 7.9* 7.9* 7.9*   GFR: Estimated Creatinine Clearance: 89.5 mL/min (by C-G formula based on SCr of 0.89 mg/dL). Liver Function Tests:  Recent Labs Lab 07/30/16 1251 07/31/16 0622  AST 135* 99*  ALT 73* 64*  ALKPHOS 271* 227*  BILITOT 3.1* 2.4*  PROT 8.3* 6.8  ALBUMIN 2.9* 2.4*    Recent Labs Lab 07/30/16 1251  LIPASE 41    Recent Labs Lab 07/30/16 1531  AMMONIA 105*   Coagulation Profile: No results for input(s): INR, PROTIME in the last 168 hours. Cardiac Enzymes: No results for input(s): CKTOTAL, CKMB, CKMBINDEX, TROPONINI in the last 168 hours. BNP (last 3 results) No results for input(s): PROBNP in the last 8760 hours. HbA1C: No results for input(s): HGBA1C in the last 72 hours. CBG: No results for input(s): GLUCAP in the last 168 hours. Lipid Profile: No results for input(s): CHOL, HDL, LDLCALC, TRIG, CHOLHDL, LDLDIRECT in the last 72 hours. Thyroid Function Tests: No results for input(s): TSH, T4TOTAL, FREET4, T3FREE, THYROIDAB in the last 72 hours. Anemia Panel: No results for input(s): VITAMINB12, FOLATE, FERRITIN, TIBC, IRON, RETICCTPCT in the last 72 hours. Sepsis Labs:  Recent Labs Lab 07/30/16 1557  LATICACIDVEN 1.75    Recent Results (from the past 240 hour(s))  MRSA PCR Screening     Status: None   Collection Time: 07/30/16  8:57 PM  Result Value Ref Range Status   MRSA by PCR NEGATIVE NEGATIVE Final    Comment:        The GeneXpert MRSA Assay (FDA approved for NASAL specimens only), is one component of a comprehensive MRSA colonization surveillance program. It is not intended to diagnose MRSA infection nor to guide or monitor treatment for MRSA infections.          Radiology Studies: No results found.      Scheduled Meds: . fluticasone  2 spray Each Nare Daily  .  folic acid  1 mg Oral Daily  . furosemide  40 mg Oral Daily  . lactulose  30 g Oral TID  . multivitamin with minerals  1 tablet Oral Daily  . nicotine  21 mg Transdermal Daily  . pantoprazole  40 mg Oral BID  . rifaximin  550 mg Oral BID  . spironolactone  25 mg Oral Daily   Continuous Infusions:   LOS: 2 days    Reymond Maynez Tanna Furry, MD [facility] Pager [phone]  If 7PM-7AM, please contact night-coverage [email or web address] Password [email or web address] 08/01/2016, 11:58 AM

## 2016-08-01 NOTE — Evaluation (Signed)
Occupational Therapy Evaluation Patient Details Name: Jose [name] MRN: [id number] DOB: Mar 19, 1956 Today's Date: 08/01/2016    History of Present Illness 61 y.o.malewith medical history significant of Hep C/Etoh/Cirrhosis, Hemochromatosis, chronic thrombocytopenia, recurrent hospitalizations with hepatic encephalopathy who presented to the ED with c/o weakness and nausea.   Clinical Impression   Pt reports he was independent with ADL and mobility PTA. Currently pt overall min guard for ADL and functional mobility. Pt presenting with generalized weakness, impaired balance, and deconditioning impacting his independence and safety with ADL and functional mobility. Pt planning to return to group home upon d/c. Pt would benefit from continued skilled OT to address established goals.    Follow Up Recommendations  No OT follow up;Supervision - Intermittent    Equipment Recommendations  None recommended by OT    Recommendations for Other Services PT consult     Precautions / Restrictions Precautions Precautions: Fall Restrictions Weight Bearing Restrictions: No      Mobility Bed Mobility Overal bed mobility: Needs Assistance Bed Mobility: Supine to Sit     Supine to sit: Supervision;HOB elevated     General bed mobility comments: for safety; no physical assist.  Transfers Overall transfer level: Needs assistance Equipment used: None Transfers: Sit to/from Stand Sit to Stand: Min guard         General transfer comment: no physical assist, min guard for safety. Mildly unsteady in standing but no LOB    Balance Overall balance assessment: Needs assistance Sitting-balance support: Feet supported;No upper extremity supported Sitting balance-Leahy Scale: Good     Standing balance support: No upper extremity supported;During functional activity Standing balance-Leahy Scale: Fair                             ADL either performed or assessed with clinical  judgement   ADL Overall ADL's : Needs assistance/impaired Eating/Feeding: Set up;Sitting Eating/Feeding Details (indicate cue type and reason): pt eating breakfast upon arrival Grooming: Set up;Sitting;Wash/dry face;Wash/dry hands   Upper Body Bathing: Set up;Supervision/ safety;Sitting   Lower Body Bathing: Min guard;Sit to/from stand   Upper Body Dressing : Set up;Supervision/safety;Sitting   Lower Body Dressing: Min guard;Sit to/from stand   Toilet Transfer: Min guard;Ambulation;Regular Glass blower/designer Details (indicate cue type and reason): Simulated by sit to stand from EOB with functional mobility in room         Functional mobility during ADLs: Min guard       Vision         Perception     Praxis      Pertinent Vitals/Pain Pain Assessment: No/denies pain ("weakness")     Hand Dominance Right   Extremity/Trunk Assessment Upper Extremity Assessment Upper Extremity Assessment: Overall WFL for tasks assessed   Lower Extremity Assessment Lower Extremity Assessment: Defer to PT evaluation   Cervical / Trunk Assessment Cervical / Trunk Assessment: Normal   Communication Communication Communication: No difficulties   Cognition Arousal/Alertness: Awake/alert Behavior During Therapy: WFL for tasks assessed/performed Overall Cognitive Status: Within Functional Limits for tasks assessed                                     General Comments       Exercises     Shoulder Instructions      Home Living Family/patient expects to be discharged to:: Group home Living Arrangements: Other (Comment);Group Home (lives  with 4-5 other people)   Type of Home: Apartment Home Access: Level entry     Home Layout: One level     Bathroom Shower/Tub: Walk-in shower         Home Equipment: Shower seat          Prior Functioning/Environment Level of Independence: Independent        Comments: uses shower chair for bathing         OT Problem List: Decreased strength;Impaired balance (sitting and/or standing)      OT Treatment/Interventions: Self-care/ADL training;Therapeutic exercise;DME and/or AE instruction;Therapeutic activities;Patient/family education;Balance training    OT Goals(Current goals can be found in the care plan section) Acute Rehab OT Goals Patient Stated Goal: get stronger OT Goal Formulation: With patient Time For Goal Achievement: 08/15/16 Potential to Achieve Goals: Good ADL Goals Pt Will Perform Grooming: with modified independence;standing Pt Will Transfer to Toilet: with modified independence;ambulating;regular height toilet Pt Will Perform Toileting - Clothing Manipulation and hygiene: with modified independence;sit to/from stand Pt Will Perform Tub/Shower Transfer: Shower transfer;with modified independence;ambulating;shower seat Additional ADL Goal #1: Pt will gather ADL items and perform UB/LB bathing/dressing at mod I level.  OT Frequency: Min 2X/week   Barriers to D/C:            Co-evaluation              End of Session Nurse Communication: Mobility status  Activity Tolerance: Patient tolerated treatment well Patient left: in chair;with call bell/phone within reach;with chair alarm set  OT Visit Diagnosis: Unsteadiness on feet (R26.81)                Time: 2409-7353 OT Time Calculation (min): 13 min Charges:  OT General Charges $OT Visit: 1 Procedure OT Evaluation $OT Eval Moderate Complexity: 1 Procedure G-Codes:     Charmain Diosdado A. Ulice Brilliant, M.S., OTR/L Pager: [place] 08/01/2016, 9:34 AM

## 2016-08-01 NOTE — Care Management Note (Signed)
Case Management Note  Patient Details  Name: [name] MRN: [id number] Date of Birth: [birth year]  Subjective/Objective:                 CM consult for medication assistance. Patient covered through Medicaid. Xifaxin listed on pta med list, also is medicaid preferred for hepatic encephalopathy. No medication needs identified at this time. Please reconsult or specify medication needs.    Action/Plan:   Expected Discharge Date:                  Expected Discharge Plan:     In-House Referral:     Discharge planning Services  CM Consult  Post Acute Care Choice:    Choice offered to:     DME Arranged:    DME Agency:     HH Arranged:    HH Agency:     Status of Service:  In process, will continue to follow  If discussed at Long Length of Stay Meetings, dates discussed:    Additional Comments:  Carles Collet, RN 08/01/2016, 8:21 AM

## 2016-08-01 NOTE — Progress Notes (Signed)
Lab reviewed. Na 123 and K 5.8  Plan:  Dc aldactone and oral lasix.  Start IV lasix Repeat lab in am

## 2016-08-02 ENCOUNTER — Encounter (HOSPITAL_COMMUNITY): Payer: Self-pay | Admitting: *Deleted

## 2016-08-02 DIAGNOSIS — E43 Unspecified severe protein-calorie malnutrition: Secondary | ICD-10-CM

## 2016-08-02 LAB — BASIC METABOLIC PANEL
ANION GAP: 8 (ref 5–15)
BUN: 15 mg/dL (ref 6–20)
CHLORIDE: 99 mmol/L — AB (ref 101–111)
CO2: 19 mmol/L — AB (ref 22–32)
Calcium: 7.6 mg/dL — ABNORMAL LOW (ref 8.9–10.3)
Creatinine, Ser: 0.97 mg/dL (ref 0.61–1.24)
GLUCOSE: 220 mg/dL — AB (ref 65–99)
Potassium: 3.5 mmol/L (ref 3.5–5.1)
Sodium: 126 mmol/L — ABNORMAL LOW (ref 135–145)

## 2016-08-02 MED ORDER — FUROSEMIDE 10 MG/ML IJ SOLN
40.0000 mg | Freq: Two times a day (BID) | INTRAMUSCULAR | Status: AC
  Administered 2016-08-02 (×2): 40 mg via INTRAVENOUS
  Filled 2016-08-02 (×2): qty 4

## 2016-08-02 MED ORDER — SPIRONOLACTONE 25 MG PO TABS
50.0000 mg | ORAL_TABLET | Freq: Every day | ORAL | Status: DC
  Administered 2016-08-02 – 2016-08-06 (×5): 50 mg via ORAL
  Filled 2016-08-02 (×5): qty 2

## 2016-08-02 NOTE — Progress Notes (Signed)
PROGRESS NOTE    Jose [name]  MMN:[id number] DOB: Nov 08, 1955 DOA: 07/30/2016 PCP: [name], MD   Brief Narrative: 61 y.o. male with medical history significant of Hep C/Etoh/Cirrhosis, Hemochromatosis, chronic thrombocytopenia, recurrent hospitalizations with hepatic encephalopathy, noncompliance presents to the ER with nausea, weakness. He currently lives in a group [facility] of[facility]",  he denies ongoing alcohol use reports that he drank a beer on Easter "Sunday at his mother's home. He denies any vomiting, no diarrhea, no fevers or chills . In the ER, he was found to have hyponatremia sodium of 121, potassium was 6.2 however specimen was hemolyzed and repeat labs,  BUN is 25, creatinine of 1.4.  Assessment & Plan:   # Acute hepatitic encephalopathy: -Patient has ammonia level of 127 on admission. -Continue lactulose, Xifaxan. -Mentally status improved.  -Pt/Ot eval  #Hyponatremia likely subacute in the setting of liver cirrhosis: -No significant improvement in serum sodium level with normal saline. Urine osmolality is elevated with elevated urine sodium level. Received NS on admission which was discontinued. Continue IV lasix, fluid restriction and resume aldactone.   Avoid sudden change in serum sodium level. Continue to monitor electrolytes. Repeat BMP in am.  -discussed with the patient regarding fluid restriction at bedside. He verbalized understanding.   #Hyperkalemia: Serum potassium level improved. Monitor labs. Resuming low-dose Aldactone.  #Acute kidney injury: Serum creatinine level improved. Continue to monitor.  #Alcohol liver cirrhosis with ascites: Continue lasix, aldactone Xifaxan and lactulose. Continue vitamins.  #Severe protein calorie malnutrition: Dietary referral,  regular diet. Patient has good oral intake.  #History of hemochromatosis: Follows up with Dr. Gorsuch  #History of hepatitis C, chronic: Recommended outpatient  follow-up.   #Chronic thrombocytopenia: Platelet level stable. No sign of bleeding. Continue to monitor.  PT, OT evaluation. DVT prophylaxis: SCD. Patient has thrombocytopenia Code Status: Full code Family Communication: No family member at bedside Disposition Plan: Likely discharge to group home in 1-2 days    Consultants:   None  Procedures: None Antimicrobials: None  Subjective: Patient was seen and examined at bedside. No new event. Denied headache, dizziness, chest pain, shortness of breath, nausea or vomiting.   Objective: Vitals:   08/01/16 2153 08/02/16 0517 08/02/16 0518 08/02/16 0609  BP: (!) 127/50 (!) 95/52 (!) 113/59   Pulse: 73 85 85   Resp: 16 18    Temp: 98.1 F (36.7 C) 98.8 F (37.1 C)    TempSrc: Oral Oral    SpO2: 99% 100%    Weight:    84.3 kg (185 lb 14.4 oz)  Height:        Intake/Output Summary (Last 24 hours) at 08/02/16 1336 Last data filed at 08/02/16 1114  Gross per 24 hour  Intake              240 ml  Output             20" 75 ml  Net            -1835 ml   Filed Weights   07/31/16 1857 08/02/16 0609  Weight: 83.6 kg (184 lb 4.8 oz) 84.3 kg (185 lb 14.4 oz)    Examination:  General exam: Lying on bed, not in distress Respiratory system: Clear bilateral. Respiratory effort normal. No wheezing or crackle Cardiovascular system: S1 & S2 heard, RRR.  No pedal edema. Gastrointestinal system: Abdomen soft, nontender, bowel sound positive, mild distention which is stable. Central nervous system: Alert and oriented. No focal neurological deficits.  Extremities: Symmetric 5 x 5 power. Skin: No rashes, lesions or ulcers Psychiatry: Judgement and insight appear normal. Mood & affect appropriate.     Data Reviewed: I have personally reviewed following labs and imaging studies  CBC:  Recent Labs Lab 07/30/16 1251 07/31/16 0622  WBC 5.6 3.9*  HGB 15.2 13.3  HCT 41.5 37.2*  MCV 96.5 96.9  PLT 81* 68*   Basic Metabolic  Panel:  Recent Labs Lab 07/31/16 1710 08/01/16 0444 08/01/16 1224 08/01/16 1653 08/02/16 0436  NA 123* 125* 123* 126* 126*  K 5.2* 4.0 5.8* 4.2 3.5  CL 100* 99* 100* 101 99*  CO2 15* 18* 15* 19* 19*  GLUCOSE 120* 113* 137* 131* 220*  BUN 19 14 15 12 15   CREATININE 1.01 0.89 0.87 0.86 0.97  CALCIUM 7.9* 7.9* 7.6* 7.7* 7.6*   GFR: Estimated Creatinine Clearance: 82.5 mL/min (by C-G formula based on SCr of 0.97 mg/dL). Liver Function Tests:  Recent Labs Lab 07/30/16 1251 07/31/16 0622  AST 135* 99*  ALT 73* 64*  ALKPHOS 271* 227*  BILITOT 3.1* 2.4*  PROT 8.3* 6.8  ALBUMIN 2.9* 2.4*    Recent Labs Lab 07/30/16 1251  LIPASE 41    Recent Labs Lab 07/30/16 1531  AMMONIA 105*   Coagulation Profile: No results for input(s): INR, PROTIME in the last 168 hours. Cardiac Enzymes: No results for input(s): CKTOTAL, CKMB, CKMBINDEX, TROPONINI in the last 168 hours. BNP (last 3 results) No results for input(s): PROBNP in the last 8760 hours. HbA1C: No results for input(s): HGBA1C in the last 72 hours. CBG: No results for input(s): GLUCAP in the last 168 hours. Lipid Profile: No results for input(s): CHOL, HDL, LDLCALC, TRIG, CHOLHDL, LDLDIRECT in the last 72 hours. Thyroid Function Tests: No results for input(s): TSH, T4TOTAL, FREET4, T3FREE, THYROIDAB in the last 72 hours. Anemia Panel: No results for input(s): VITAMINB12, FOLATE, FERRITIN, TIBC, IRON, RETICCTPCT in the last 72 hours. Sepsis Labs:  Recent Labs Lab 07/30/16 1557  LATICACIDVEN 1.75    Recent Results (from the past 240 hour(s))  MRSA PCR Screening     Status: None   Collection Time: 07/30/16  8:57 PM  Result Value Ref Range Status   MRSA by PCR NEGATIVE NEGATIVE Final    Comment:        The GeneXpert MRSA Assay (FDA approved for NASAL specimens only), is one component of a comprehensive MRSA colonization surveillance program. It is not intended to diagnose MRSA infection nor to guide  or monitor treatment for MRSA infections.          Radiology Studies: No results found.      Scheduled Meds: . fluticasone  2 spray Each Nare Daily  . folic acid  1 mg Oral Daily  . furosemide  40 mg Intravenous BID  . lactulose  30 g Oral TID  . multivitamin with minerals  1 tablet Oral Daily  . nicotine  21 mg Transdermal Daily  . pantoprazole  40 mg Oral BID  . rifaximin  550 mg Oral BID  . spironolactone  50 mg Oral Daily   Continuous Infusions:   LOS: 3 days    Arzell Mcgeehan Tanna Furry, MD [facility] Pager [phone]  If 7PM-7AM, please contact night-coverage [email or web address] Password [email or web address] 08/02/2016, 1:36 PM

## 2016-08-03 DIAGNOSIS — N170 Acute kidney failure with tubular necrosis: Secondary | ICD-10-CM

## 2016-08-03 DIAGNOSIS — N181 Chronic kidney disease, stage 1: Secondary | ICD-10-CM

## 2016-08-03 DIAGNOSIS — I10 Essential (primary) hypertension: Secondary | ICD-10-CM

## 2016-08-03 LAB — BASIC METABOLIC PANEL
Anion gap: 9 (ref 5–15)
BUN: 16 mg/dL (ref 6–20)
CALCIUM: 7.7 mg/dL — AB (ref 8.9–10.3)
CHLORIDE: 97 mmol/L — AB (ref 101–111)
CO2: 21 mmol/L — AB (ref 22–32)
CREATININE: 0.99 mg/dL (ref 0.61–1.24)
GFR calc non Af Amer: >60 mL/min (ref >60)
GLUCOSE: 146 mg/dL — AB (ref 65–99)
Potassium: 3.6 mmol/L (ref 3.5–5.1)
Sodium: 127 mmol/L — ABNORMAL LOW (ref 135–145)

## 2016-08-03 LAB — AMMONIA: Ammonia: 103 umol/L — ABNORMAL HIGH (ref 9–35)

## 2016-08-03 MED ORDER — FUROSEMIDE 10 MG/ML IJ SOLN
40.0000 mg | Freq: Two times a day (BID) | INTRAMUSCULAR | Status: DC
  Administered 2016-08-03 – 2016-08-05 (×4): 40 mg via INTRAVENOUS
  Filled 2016-08-03 (×4): qty 4

## 2016-08-03 NOTE — Progress Notes (Signed)
PROGRESS NOTE    Jose [name]  LOV:[id number] DOB: 05-06-1955 DOA: 07/30/2016 PCP: [name], MD   Brief Narrative: 61 y.o. male with medical history significant of Hep C/Etoh/Cirrhosis, Hemochromatosis, chronic thrombocytopenia, recurrent hospitalizations with hepatic encephalopathy, noncompliance presents to the ER with nausea, weakness. He currently lives in a group [facility]",  he denies ongoing alcohol use reports that he drank a beer on Easter "Sunday at his mother's home. He denies any vomiting, no diarrhea, no fevers or chills . In the ER, he was found to have hyponatremia sodium of 121, potassium was 6.2 however specimen was hemolyzed and repeat labs,  BUN is 25, creatinine of 1.4.  Assessment & Plan:   # Acute hepatitic encephalopathy: -Patient has ammonia level of 127 on admission. Recheck -Continue lactulose, Xifaxan. -Mentally status improved.  -Pt/Ot eval-recommend home health  #Hyponatremia likely subacute in the setting of liver cirrhosis: -Very very slow improvement  in serum sodium . Urine osmolality is elevated with elevated urine sodium level. Received NS on admission which was discontinued. Continue IV lasix, fluid restriction and resume aldactone.   Avoid sudden change in serum sodium level. Continue to monitor electrolytes. Repeat BMP in am.  -discussed with the patient regarding fluid restriction at bedside. He verbalized understanding.   #Hyperkalemia: Serum potassium level improved. Monitor labs. Resuming low-dose Aldactone.  #Acute kidney injury: Serum creatinine level improved. Continue to monitor.  #Alcohol liver cirrhosis with ascites: Recheck liver function tomorrow, PT/INR, Continue lasix, aldactone Xifaxan and lactulose. Continue vitamins.  #Severe protein calorie malnutrition: Dietary referral,  regular diet. Patient has good oral intake.  #History of hemochromatosis: Follows up with Dr. Gorsuch  #History of  hepatitis C, chronic: Recommended outpatient follow-up.   #Chronic thrombocytopenia: Platelet level stable. No sign of bleeding. Continue to monitor.  PT, OT evaluation. DVT prophylaxis: SCD. Patient has thrombocytopenia Code Status: Full code Family Communication: No family member at bedside Disposition Plan: Likely discharge to group home in 1-2 days    Consultants:   None  Procedures: None Antimicrobials: None  Subjective:   Slightly confused, Denied headache, dizziness, chest pain, shortness of breath, nausea or vomiting.   Objective: Vitals:   08/02/16 0609 08/02/16 1503 08/02/16 2100 08/03/16 0501  BP:  110/67 116/64 116/66  Pulse:  80 66 75  Resp:  20 19 (!) 21  Temp:  99 F (37.2 C) 98.2 F (36.8 C) 98.2 F (36.8 C)  TempSrc:  Oral    SpO2:  100% 100% 100%  Weight: 84.3 kg (185 lb 14.4 oz)     Height:        Intake/Output Summary (Last 24 hours) at 08/03/16 1250 Last data filed at 08/03/16 1010  Gross per 24 hour  Intake              840 ml  Output             12" 00 ml  Net             -360 ml   Filed Weights   07/31/16 1857 08/02/16 0609  Weight: 83.6 kg (184 lb 4.8 oz) 84.3 kg (185 lb 14.4 oz)    Examination:  General exam: Lying on bed, not in distress Respiratory system: Clear bilateral. Respiratory effort normal. No wheezing or crackle Cardiovascular system: S1 & S2 heard, RRR.  No pedal edema. Gastrointestinal system: Abdomen soft, nontender, bowel sound positive, mild distention which is stable. Central nervous system: Alert and oriented. No  focal neurological deficits. Extremities: Symmetric 5 x 5 power. Skin: No rashes, lesions or ulcers Psychiatry: Judgement and insight appear normal. Mood & affect appropriate.     Data Reviewed: I have personally reviewed following labs and imaging studies  CBC:  Recent Labs Lab 07/30/16 1251 07/31/16 0622  WBC 5.6 3.9*  HGB 15.2 13.3  HCT 41.5 37.2*  MCV 96.5 96.9  PLT 81* 68*   Basic  Metabolic Panel:  Recent Labs Lab 08/01/16 0444 08/01/16 1224 08/01/16 1653 08/02/16 0436 08/03/16 0419  NA 125* 123* 126* 126* 127*  K 4.0 5.8* 4.2 3.5 3.6  CL 99* 100* 101 99* 97*  CO2 18* 15* 19* 19* 21*  GLUCOSE 113* 137* 131* 220* 146*  BUN 14 15 12 15 16   CREATININE 0.89 0.87 0.86 0.97 0.99  CALCIUM 7.9* 7.6* 7.7* 7.6* 7.7*   GFR: Estimated Creatinine Clearance: 80.8 mL/min (by C-G formula based on SCr of 0.99 mg/dL). Liver Function Tests:  Recent Labs Lab 07/30/16 1251 07/31/16 0622  AST 135* 99*  ALT 73* 64*  ALKPHOS 271* 227*  BILITOT 3.1* 2.4*  PROT 8.3* 6.8  ALBUMIN 2.9* 2.4*    Recent Labs Lab 07/30/16 1251  LIPASE 41    Recent Labs Lab 07/30/16 1531  AMMONIA 105*   Coagulation Profile: No results for input(s): INR, PROTIME in the last 168 hours. Cardiac Enzymes: No results for input(s): CKTOTAL, CKMB, CKMBINDEX, TROPONINI in the last 168 hours. BNP (last 3 results) No results for input(s): PROBNP in the last 8760 hours. HbA1C: No results for input(s): HGBA1C in the last 72 hours. CBG: No results for input(s): GLUCAP in the last 168 hours. Lipid Profile: No results for input(s): CHOL, HDL, LDLCALC, TRIG, CHOLHDL, LDLDIRECT in the last 72 hours. Thyroid Function Tests: No results for input(s): TSH, T4TOTAL, FREET4, T3FREE, THYROIDAB in the last 72 hours. Anemia Panel: No results for input(s): VITAMINB12, FOLATE, FERRITIN, TIBC, IRON, RETICCTPCT in the last 72 hours. Sepsis Labs:  Recent Labs Lab 07/30/16 1557  LATICACIDVEN 1.75    Recent Results (from the past 240 hour(s))  MRSA PCR Screening     Status: None   Collection Time: 07/30/16  8:57 PM  Result Value Ref Range Status   MRSA by PCR NEGATIVE NEGATIVE Final    Comment:        The GeneXpert MRSA Assay (FDA approved for NASAL specimens only), is one component of a comprehensive MRSA colonization surveillance program. It is not intended to diagnose MRSA infection nor to  guide or monitor treatment for MRSA infections.          Radiology Studies: No results found.      Scheduled Meds: . fluticasone  2 spray Each Nare Daily  . folic acid  1 mg Oral Daily  . lactulose  30 g Oral TID  . multivitamin with minerals  1 tablet Oral Daily  . nicotine  21 mg Transdermal Daily  . pantoprazole  40 mg Oral BID  . rifaximin  550 mg Oral BID  . spironolactone  50 mg Oral Daily   Continuous Infusions:   LOS: 4 days    Reyne Dumas, MD [facility] Pager [phone]  If 7PM-7AM, please contact night-coverage [email or web address] Password [email or web address] 08/03/2016, 12:50 PM

## 2016-08-03 NOTE — Progress Notes (Signed)
Chaplain assisted patient in filling out AD, getting witnesses and the [facility] to sign as notary.  Please call if further assistance is needed.  Luana Shu [phone]    08/03/16 1200  Clinical Encounter Type  Visited With Patient and family together  Visit Type (AD)  Referral From Patient  Consult/Referral To Chaplain  Advance Directives (For Healthcare)  Does Patient Have a Medical Advance Directive? Yes  Does patient want to make changes to medical advance directive? Yes (Inpatient - patient requests chaplain consult to change a medical advance directive)  Type of Advance Directive [place];Living will  Would patient like information on creating a medical advance directive? Yes (Inpatient - patient requests chaplain consult to create a medical advance directive)     08/03/16 1200  Clinical Encounter Type  Visited With Patient and family together  Visit Type (AD)  Referral From Patient  Consult/Referral To Chaplain  Advance Directives (For Healthcare)  Does Patient Have a Medical Advance Directive? Yes  Does patient want to make changes to medical advance directive? Yes (Inpatient - patient requests chaplain consult to change a medical advance directive)  Type of Advance Directive [place];Living will  Would patient like information on creating a medical advance directive? Yes (Inpatient - patient requests chaplain consult to create a medical advance directive)

## 2016-08-04 LAB — COMPREHENSIVE METABOLIC PANEL
ALBUMIN: 2.3 g/dL — AB (ref 3.5–5.0)
ALK PHOS: 251 U/L — AB (ref 38–126)
ALT: 61 U/L (ref 17–63)
ALT: 72 U/L — ABNORMAL HIGH (ref 17–63)
AST: 89 U/L — ABNORMAL HIGH (ref 15–41)
AST: 108 U/L — AB (ref 15–41)
Albumin: 2 g/dL — ABNORMAL LOW (ref 3.5–5.0)
Alkaline Phosphatase: 276 U/L — ABNORMAL HIGH (ref 38–126)
Anion gap: 7 (ref 5–15)
Anion gap: 7 (ref 5–15)
BUN: 15 mg/dL (ref 6–20)
BUN: 16 mg/dL (ref 6–20)
CALCIUM: 7.8 mg/dL — AB (ref 8.9–10.3)
CHLORIDE: 98 mmol/L — AB (ref 101–111)
CHLORIDE: 99 mmol/L — AB (ref 101–111)
CO2: 23 mmol/L (ref 22–32)
CO2: 22 mmol/L (ref 22–32)
CREATININE: 0.96 mg/dL (ref 0.61–1.24)
Calcium: 8 mg/dL — ABNORMAL LOW (ref 8.9–10.3)
Creatinine, Ser: 1.06 mg/dL (ref 0.61–1.24)
GFR calc Af Amer: >60 mL/min (ref >60)
GFR calc non Af Amer: >60 mL/min (ref >60)
GLUCOSE: 213 mg/dL — AB (ref 65–99)
Glucose, Bld: 126 mg/dL — ABNORMAL HIGH (ref 65–99)
Potassium: 3.6 mmol/L (ref 3.5–5.1)
Potassium: 3.9 mmol/L (ref 3.5–5.1)
Sodium: 128 mmol/L — ABNORMAL LOW (ref 135–145)
Sodium: 128 mmol/L — ABNORMAL LOW (ref 135–145)
Total Bilirubin: 1.4 mg/dL — ABNORMAL HIGH (ref 0.3–1.2)
Total Bilirubin: 1.6 mg/dL — ABNORMAL HIGH (ref 0.3–1.2)
Total Protein: 5.8 g/dL — ABNORMAL LOW (ref 6.5–8.1)
Total Protein: 6.6 g/dL (ref 6.5–8.1)

## 2016-08-04 LAB — BLOOD GAS, ARTERIAL
ACID-BASE EXCESS: 1.2 mmol/L (ref 0.0–2.0)
BICARBONATE: 24.7 mmol/L (ref 20.0–28.0)
FIO2: 21
O2 Saturation: 95.5 %
PCO2 ART: 35.1 mmHg (ref 32.0–48.0)
PO2 ART: 80.6 mmHg — AB (ref 83.0–108.0)
Patient temperature: 98.6
pH, Arterial: 7.462 — ABNORMAL HIGH (ref 7.350–7.450)

## 2016-08-04 LAB — CBC
HCT: 31.2 % — ABNORMAL LOW (ref 39.0–52.0)
HEMOGLOBIN: 11.2 g/dL — AB (ref 13.0–17.0)
MCH: 35.1 pg — ABNORMAL HIGH (ref 26.0–34.0)
MCHC: 35.9 g/dL (ref 30.0–36.0)
MCV: 97.8 fL (ref 78.0–100.0)
PLATELETS: 55 10*3/uL — AB (ref 150–400)
RBC: 3.19 MIL/uL — AB (ref 4.22–5.81)
RDW: 12.8 % (ref 11.5–15.5)
WBC: 4.3 10*3/uL (ref 4.0–10.5)

## 2016-08-04 LAB — MAGNESIUM: MAGNESIUM: 2 mg/dL (ref 1.7–2.4)

## 2016-08-04 LAB — AMMONIA: Ammonia: 121 umol/L — ABNORMAL HIGH (ref 9–35)

## 2016-08-04 MED ORDER — TRAZODONE HCL 50 MG PO TABS: 25.0000 mg | ORAL_TABLET | Freq: Every evening | ORAL | Status: DC | PRN

## 2016-08-04 MED ORDER — LACTULOSE 10 GM/15ML PO SOLN
30.0000 g | Freq: Four times a day (QID) | ORAL | Status: DC
  Administered 2016-08-04 – 2016-08-06 (×8): 30 g via ORAL
  Filled 2016-08-04 (×8): qty 45

## 2016-08-04 NOTE — Progress Notes (Signed)
PROGRESS NOTE    Jose [name]  UPB:[id number] DOB: [birth year] DOA: 07/30/2016 PCP: [name], MD   Brief Narrative: 61 y.o. male with medical history significant of Hep C/Etoh/Cirrhosis, Hemochromatosis, chronic thrombocytopenia, recurrent hospitalizations with hepatic encephalopathy, noncompliance presents to the ER with nausea, weakness. He currently lives in a group [facility]",  he denies ongoing alcohol use reports that he drank a beer on Easter "Sunday at his mother's home. He denies any vomiting, no diarrhea, no fevers or chills . In the ER, he was found to have hyponatremia sodium of 121, potassium was 6.2 however specimen was hemolyzed and repeat labs,  BUN is 25, creatinine of 1.4.  Assessment & Plan:   # Acute hepatitic encephalopathy: -Patient has ammonia level of 127 on admission.  Increased overnight from 103-121 - increased dose of lactulose, continue Xifaxan. -Mentally status improved.  -Pt/Ot eval-recommend home health Recheck ammonia in am  Check ABG  #Hyponatremia likely subacute in the setting of liver cirrhosis: -Very very slow improvement  in serum sodium , now 128. Urine osmolality is elevated with elevated urine sodium level. Received NS on admission which was discontinued.change  IV to PO  lasix, fluid restriction 1200 cc  and continue  aldactone.   Continue to monitor electrolytes. Repeat BMP in am.  -discussed with the patient regarding fluid restriction at bedside. He verbalized understanding.   #Hyperkalemia: Serum potassium level improved. Monitor labs. While on Aldactone.  #Acute kidney injury: Serum creatinine level improved. Continue to monitor.  #Alcohol liver cirrhosis with ascites: Recheck liver function tomorrow, PT/INR, Continue lasix, aldactone Xifaxan and lactulose. Continue vitamins.  #Severe protein calorie malnutrition: Dietary referral,  regular diet. Patient has good oral intake.  #History of  hemochromatosis: Follows up with Dr. Gorsuch  #History of hepatitis C, chronic: Recommended outpatient follow-up.   #Chronic thrombocytopenia: Platelet level stable. No sign of bleeding. Continue to monitor.  PT, OT evaluation. DVT prophylaxis: SCD. Patient has thrombocytopenia Code Status: Full code Family Communication: No family member at bedside Disposition Plan: Likely discharge to group home tomorrow    Consultants:   None  Procedures: None Antimicrobials: None  Subjective:   Slightly confused, very somnolent and hard to stay awake and have a converstaion   Objective: Vitals:   08/03/16 0501 08/03/16 1608 08/03/16 2156 08/04/16 0632  BP: 116/66 102/62 123/63 108/60  Pulse: 75 73 77 63  Resp: (!) 21 18 17 18  Temp: 98.2 F (36.8 C) 98.3 F (36.8 C) 98.2 F (36.8 C) 98.6 F (37 C)  TempSrc:   Oral Oral  SpO2: 100% 99% 99% 100%  Weight:      Height:        Intake/Output Summary (Last 24 hours) at 08/04/16 0937 Last data filed at 08/04/16 0925  Gross per 24 hour  Intake             1100 ml  Output              800 ml  Net              30" 0 ml   Filed Weights   07/31/16 1857 08/02/16 0609  Weight: 83.6 kg (184 lb 4.8 oz) 84.3 kg (185 lb 14.4 oz)    Examination:  General exam: Lying on bed, not in distress Respiratory system: Clear bilateral. Respiratory effort normal. No wheezing or crackle Cardiovascular system: S1 & S2 heard, RRR.  No pedal edema. Gastrointestinal system: Abdomen soft, nontender, bowel  sound positive, mild distention which is stable. Central nervous system: Alert and oriented. No focal neurological deficits. Extremities: Symmetric 5 x 5 power. Skin: No rashes, lesions or ulcers Psychiatry: Judgement and insight appear normal. Mood & affect appropriate.     Data Reviewed: I have personally reviewed following labs and imaging studies  CBC:  Recent Labs Lab 07/30/16 1251 07/31/16 0622 08/04/16 0455  WBC 5.6 3.9* 4.3  HGB  15.2 13.3 11.2*  HCT 41.5 37.2* 31.2*  MCV 96.5 96.9 97.8  PLT 81* 68* 55*   Basic Metabolic Panel:  Recent Labs Lab 08/01/16 1224 08/01/16 1653 08/02/16 0436 08/03/16 0419 08/04/16 0455  NA 123* 126* 126* 127* 128*  K 5.8* 4.2 3.5 3.6 3.6  CL 100* 101 99* 97* 98*  CO2 15* 19* 19* 21* 23  GLUCOSE 137* 131* 220* 146* 126*  BUN 15 12 15 16 15   CREATININE 0.87 0.86 0.97 0.99 0.96  CALCIUM 7.6* 7.7* 7.6* 7.7* 7.8*   GFR: Estimated Creatinine Clearance: 83.3 mL/min (by C-G formula based on SCr of 0.96 mg/dL). Liver Function Tests:  Recent Labs Lab 07/30/16 1251 07/31/16 0622 08/04/16 0455  AST 135* 99* 89*  ALT 73* 64* 61  ALKPHOS 271* 227* 251*  BILITOT 3.1* 2.4* 1.4*  PROT 8.3* 6.8 5.8*  ALBUMIN 2.9* 2.4* 2.0*    Recent Labs Lab 07/30/16 1251  LIPASE 41    Recent Labs Lab 07/30/16 1531 08/03/16 1333 08/04/16 0455  AMMONIA 105* 103* 121*   Coagulation Profile: No results for input(s): INR, PROTIME in the last 168 hours. Cardiac Enzymes: No results for input(s): CKTOTAL, CKMB, CKMBINDEX, TROPONINI in the last 168 hours. BNP (last 3 results) No results for input(s): PROBNP in the last 8760 hours. HbA1C: No results for input(s): HGBA1C in the last 72 hours. CBG: No results for input(s): GLUCAP in the last 168 hours. Lipid Profile: No results for input(s): CHOL, HDL, LDLCALC, TRIG, CHOLHDL, LDLDIRECT in the last 72 hours. Thyroid Function Tests: No results for input(s): TSH, T4TOTAL, FREET4, T3FREE, THYROIDAB in the last 72 hours. Anemia Panel: No results for input(s): VITAMINB12, FOLATE, FERRITIN, TIBC, IRON, RETICCTPCT in the last 72 hours. Sepsis Labs:  Recent Labs Lab 07/30/16 1557  LATICACIDVEN 1.75    Recent Results (from the past 240 hour(s))  MRSA PCR Screening     Status: None   Collection Time: 07/30/16  8:57 PM  Result Value Ref Range Status   MRSA by PCR NEGATIVE NEGATIVE Final    Comment:        The GeneXpert MRSA Assay  (FDA approved for NASAL specimens only), is one component of a comprehensive MRSA colonization surveillance program. It is not intended to diagnose MRSA infection nor to guide or monitor treatment for MRSA infections.          Radiology Studies: No results found.      Scheduled Meds: . fluticasone  2 spray Each Nare Daily  . folic acid  1 mg Oral Daily  . furosemide  40 mg Intravenous BID  . lactulose  30 g Oral QID  . multivitamin with minerals  1 tablet Oral Daily  . nicotine  21 mg Transdermal Daily  . pantoprazole  40 mg Oral BID  . rifaximin  550 mg Oral BID  . spironolactone  50 mg Oral Daily   Continuous Infusions:   LOS: 5 days    Reyne Dumas, MD [facility] Pager [phone]  If 7PM-7AM, please contact night-coverage [email or web address] Password [facility] 08/04/2016,  9:37 AM

## 2016-08-04 NOTE — Progress Notes (Signed)
PT Cancellation Note  Patient Details Name: Jose [name] MRN: [id number] DOB: 02-Dec-1955   Cancelled Treatment:    Reason Eval/Treat Not Completed: Fatigue/lethargy limiting ability to participate. Nursing requests PT check back tomorrow.    Cassell Clement, PT, CSCS Pager [phone] Office [phone]  08/04/2016, 12:17 PM

## 2016-08-05 ENCOUNTER — Inpatient Hospital Stay (HOSPITAL_COMMUNITY): Payer: Medicaid Other

## 2016-08-05 LAB — COMPREHENSIVE METABOLIC PANEL
ALBUMIN: 2 g/dL — AB (ref 3.5–5.0)
ALK PHOS: 241 U/L — AB (ref 38–126)
ALT: 67 U/L — ABNORMAL HIGH (ref 17–63)
ANION GAP: 6 (ref 5–15)
AST: 99 U/L — AB (ref 15–41)
BILIRUBIN TOTAL: 1.3 mg/dL — AB (ref 0.3–1.2)
BUN: 15 mg/dL (ref 6–20)
CALCIUM: 8 mg/dL — AB (ref 8.9–10.3)
CO2: 26 mmol/L (ref 22–32)
Chloride: 100 mmol/L — ABNORMAL LOW (ref 101–111)
Creatinine, Ser: 1 mg/dL (ref 0.61–1.24)
GFR calc Af Amer: >60 mL/min (ref >60)
GLUCOSE: 126 mg/dL — AB (ref 65–99)
POTASSIUM: 3.7 mmol/L (ref 3.5–5.1)
Sodium: 132 mmol/L — ABNORMAL LOW (ref 135–145)
TOTAL PROTEIN: 5.9 g/dL — AB (ref 6.5–8.1)

## 2016-08-05 LAB — PROTIME-INR
INR: 1.21
PROTHROMBIN TIME: 15.4 s — AB (ref 11.4–15.2)

## 2016-08-05 LAB — AMMONIA: AMMONIA: 139 umol/L — AB (ref 9–35)

## 2016-08-05 MED ORDER — FUROSEMIDE 40 MG PO TABS
40.0000 mg | ORAL_TABLET | Freq: Two times a day (BID) | ORAL | Status: DC
  Administered 2016-08-05 – 2016-08-06 (×2): 40 mg via ORAL
  Filled 2016-08-05 (×2): qty 1

## 2016-08-05 MED ORDER — GADOXETATE DISODIUM 0.25 MMOL/ML IV SOLN
10.0000 mL | Freq: Once | INTRAVENOUS | Status: AC
  Administered 2016-08-05: 8 mL via INTRAVENOUS

## 2016-08-05 NOTE — Progress Notes (Signed)
qPhysical Therapy Treatment Patient Details Name: Jose [name] MRN: [id number] DOB: 08-12-1955 Today's Date: 08/05/2016    History of Present Illness 61 y.o. male with medical history significant of Hep C/Etoh/Cirrhosis, Hemochromatosis, chronic thrombocytopenia, recurrent hospitalizations with hepatic encephalopathy who presented to the ED with c/o weakness and nausea.    PT Comments    Pt progressing well with more steady gait pattern.  Pt able to perform gait with RW with good balance.  Plan next session for stair training and issuing HEP for continuned use at home.     Follow Up Recommendations  Home health PT;Supervision/Assistance - 24 hour     Equipment Recommendations  Rolling walker with 5" wheels;3in1 (PT)    Recommendations for Other Services       Precautions / Restrictions Precautions Precautions: Fall Restrictions Weight Bearing Restrictions: No    Mobility  Bed Mobility Overal bed mobility: Modified Independent Bed Mobility: Supine to Sit;Sit to Supine       Sit to supine: Modified independent (Device/Increase time)   General bed mobility comments: HOB elevated no physical assistance needed, + use of rail.    Transfers Overall transfer level: Needs assistance Equipment used: None Transfers: Sit to/from Stand Sit to Stand: Supervision         General transfer comment: Cues for technique no assistance needed.    Ambulation/Gait Ambulation/Gait assistance: Supervision Ambulation Distance (Feet): 200 Feet Assistive device: None Gait Pattern/deviations: Step-through pattern;Decreased stride length   Gait velocity interpretation: Below normal speed for age/gender General Gait Details: Cues for upper trunk control and increasing stride length.  Good reciprocal arm swing noted.     Stairs            Wheelchair Mobility    Modified Rankin (Stroke Patients Only)       Balance     Sitting balance-Leahy Scale: Normal       Standing  balance-Leahy Scale: Good                              Cognition Arousal/Alertness: Awake/alert Behavior During Therapy: WFL for tasks assessed/performed Overall Cognitive Status: Within Functional Limits for tasks assessed                                        Exercises      General Comments        Pertinent Vitals/Pain Pain Assessment: No/denies pain    Home Living                      Prior Function            PT Goals (current goals can now be found in the care plan section) Acute Rehab PT Goals Patient Stated Goal: get stronger Potential to Achieve Goals: Good Progress towards PT goals: Progressing toward goals    Frequency    Min 3X/week      PT Plan Current plan remains appropriate    Co-evaluation             End of Session Equipment Utilized During Treatment: Gait belt Activity Tolerance: Patient limited by fatigue Patient left: with call bell/phone within reach;in bed;with bed alarm set Nurse Communication: Mobility status PT Visit Diagnosis: Unsteadiness on feet (R26.81);Muscle weakness (generalized) (M62.81)     Time: 2595-6387 PT Time Calculation (min) (ACUTE ONLY): 11  min  Charges:  $Gait Training: 8-22 mins                    G Codes:       Jose [name], PTA pager [phone]    Jose [name] 08/05/2016, 11:33 AM

## 2016-08-05 NOTE — Progress Notes (Signed)
OT Cancellation Note  Patient Details Name: Jose [name] MRN: [id number] DOB: Apr 27, 1956   Cancelled Treatment:    Reason Eval/Treat Not Completed: Fatigue/lethargy limiting ability to participate.  Pt aroused to name with increased time, however unable to maintain fully aroused to engage in any functional activity with pt falling back asleep.  Will follow as able.  Simonne Come 08/05/2016, 2:56 PM

## 2016-08-05 NOTE — Progress Notes (Signed)
PROGRESS NOTE    Jose [name]  AES:[id number] DOB: 06/13/1955 DOA: 07/30/2016 PCP: [name], MD   Brief Narrative: 61 y.o. male with medical history significant of Hep C/Etoh/Cirrhosis, Hemochromatosis, chronic thrombocytopenia, recurrent hospitalizations with hepatic encephalopathy, noncompliance presents to the ER with nausea, weakness. He currently lives in a group home by the name of:" [facility]",  he denies ongoing alcohol use reports that he drank a beer on Easter "Sunday at his mother's home. He denies any vomiting, no diarrhea, no fevers or chills . In the ER, he was found to have hyponatremia sodium of 121, potassium was 6.2 however specimen was hemolyzed and repeat labs,  BUN is 25, creatinine of 1.4.  Assessment & Plan:   # Acute hepatitic encephalopathy: -Patient has ammonia level of 127 on admission.  Increased overnight from 103-121>139 - increased dose of lactulose, continue Xifaxan. Low-protein diet, will obtain nutrition consult -Mentally status waxing and waning Requested GI to evaluate, repeat MRI abdomen due to concern for hepatoma, check alpha-fetoprotein -Pt/Ot eval-recommend home health ABG looks okay   #Hyponatremia likely subacute in the setting of liver cirrhosis: -Very very slow improvement  in serum sodium , now 128>132. Urine osmolality is elevated with elevated urine sodium level. Received NS on admission which was discontinued.change  IV to PO  lasix, fluid restriction 1200 cc  and continue  aldactone. Continue to monitor electrolytes. Repeat BMP in am.  -discussed with the patient regarding fluid restriction at bedside. He verbalized understanding.   #Hyperkalemia: Serum potassium level improved. Monitor labs. While on Aldactone.  #Acute kidney injury: Serum creatinine level improved. Continue to monitor.  #Alcohol liver cirrhosis with ascites: Recheck liver function tomorrow, PT/INR, Continue lasix, aldactone Xifaxan and lactulose.  Continue vitamins.  #Severe protein calorie malnutrition: Dietary referral,  regular diet. Patient has good oral intake.  #History of hemochromatosis: Follows up with Dr. Gorsuch  #History of hepatitis C, chronic: Recommended outpatient follow-up.   #Chronic thrombocytopenia: Platelet level stable. No sign of bleeding. Continue to monitor.  PT, OT evaluation.   DVT prophylaxis: SCD. Patient has thrombocytopenia Code Status: Full code Family Communication: No family member at bedside Disposition Plan: GI consult    Consultants:   GI  Procedures: None Antimicrobials: None  Subjective:   Slightly confused, very somnolent and hard to stay awake and have a converstaion   Objective: Vitals:   08/04/16 2251 08/04/16 2253 08/04/16 2255 08/05/16 0430  BP: 121/64 116/65 125/74 108/61  Pulse: 65 66 76 67  Resp:    18  Temp:    99 F (37.2 C)  TempSrc:      SpO2: 98% 100% 100% 96%  Weight:      Height:        Intake/Output Summary (Last 24 hours) at 08/05/16 1059 Last data filed at 08/05/16 0655  Gross per 24 hour  Intake              560 ml  Output              225 ml  Net              33" 5 ml   Filed Weights   07/31/16 1857 08/02/16 0609  Weight: 83.6 kg (184 lb 4.8 oz) 84.3 kg (185 lb 14.4 oz)    Examination:  General exam: Lying on bed, not in distress Respiratory system: Clear bilateral. Respiratory effort normal. No wheezing or crackle Cardiovascular system: S1 & S2 heard, RRR.  No pedal edema. Gastrointestinal  system: Abdomen soft, nontender, bowel sound positive, mild distention which is stable. Central nervous system: Alert and oriented. No focal neurological deficits. Extremities: Symmetric 5 x 5 power. Skin: No rashes, lesions or ulcers Psychiatry: Judgement and insight appear normal. Mood & affect appropriate.     Data Reviewed: I have personally reviewed following labs and imaging studies  CBC:  Recent Labs Lab 07/30/16 1251 07/31/16 0622  08/04/16 0455  WBC 5.6 3.9* 4.3  HGB 15.2 13.3 11.2*  HCT 41.5 37.2* 31.2*  MCV 96.5 96.9 97.8  PLT 81* 68* 55*   Basic Metabolic Panel:  Recent Labs Lab 08/02/16 0436 08/03/16 0419 08/04/16 0455 08/04/16 0953 08/05/16 0625  NA 126* 127* 128* 128* 132*  K 3.5 3.6 3.6 3.9 3.7  CL 99* 97* 98* 99* 100*  CO2 19* 21* 23 22 26   GLUCOSE 220* 146* 126* 213* 126*  BUN 15 16 15 16 15   CREATININE 0.97 0.99 0.96 1.06 1.00  CALCIUM 7.6* 7.7* 7.8* 8.0* 8.0*  MG  --   --   --  2.0  --    GFR: Estimated Creatinine Clearance: 80 mL/min (by C-G formula based on SCr of 1 mg/dL). Liver Function Tests:  Recent Labs Lab 07/30/16 1251 07/31/16 0622 08/04/16 0455 08/04/16 0953 08/05/16 0625  AST 135* 99* 89* 108* 99*  ALT 73* 64* 61 72* 67*  ALKPHOS 271* 227* 251* 276* 241*  BILITOT 3.1* 2.4* 1.4* 1.6* 1.3*  PROT 8.3* 6.8 5.8* 6.6 5.9*  ALBUMIN 2.9* 2.4* 2.0* 2.3* 2.0*    Recent Labs Lab 07/30/16 1251  LIPASE 41    Recent Labs Lab 07/30/16 1531 08/03/16 1333 08/04/16 0455 08/05/16 0625  AMMONIA 105* 103* 121* 139*   Coagulation Profile:  Recent Labs Lab 08/05/16 0625  INR 1.21   Cardiac Enzymes: No results for input(s): CKTOTAL, CKMB, CKMBINDEX, TROPONINI in the last 168 hours. BNP (last 3 results) No results for input(s): PROBNP in the last 8760 hours. HbA1C: No results for input(s): HGBA1C in the last 72 hours. CBG: No results for input(s): GLUCAP in the last 168 hours. Lipid Profile: No results for input(s): CHOL, HDL, LDLCALC, TRIG, CHOLHDL, LDLDIRECT in the last 72 hours. Thyroid Function Tests: No results for input(s): TSH, T4TOTAL, FREET4, T3FREE, THYROIDAB in the last 72 hours. Anemia Panel: No results for input(s): VITAMINB12, FOLATE, FERRITIN, TIBC, IRON, RETICCTPCT in the last 72 hours. Sepsis Labs:  Recent Labs Lab 07/30/16 1557  LATICACIDVEN 1.75    Recent Results (from the past 240 hour(s))  MRSA PCR Screening     Status: None    Collection Time: 07/30/16  8:57 PM  Result Value Ref Range Status   MRSA by PCR NEGATIVE NEGATIVE Final    Comment:        The GeneXpert MRSA Assay (FDA approved for NASAL specimens only), is one component of a comprehensive MRSA colonization surveillance program. It is not intended to diagnose MRSA infection nor to guide or monitor treatment for MRSA infections.          Radiology Studies: No results found.      Scheduled Meds: . fluticasone  2 spray Each Nare Daily  . folic acid  1 mg Oral Daily  . furosemide  40 mg Intravenous BID  . lactulose  30 g Oral QID  . multivitamin with minerals  1 tablet Oral Daily  . nicotine  21 mg Transdermal Daily  . pantoprazole  40 mg Oral BID  . rifaximin  550 mg Oral  BID  . spironolactone  50 mg Oral Daily   Continuous Infusions:   LOS: 6 days    Reyne Dumas, MD [facility] Pager [phone]  If 7PM-7AM, please contact night-coverage [email or web address] Password [email or web address] 08/05/2016, 10:59 AM

## 2016-08-06 LAB — COMPREHENSIVE METABOLIC PANEL
ALBUMIN: 2 g/dL — AB (ref 3.5–5.0)
ALT: 63 U/L (ref 17–63)
ANION GAP: 8 (ref 5–15)
AST: 98 U/L — AB (ref 15–41)
Alkaline Phosphatase: 230 U/L — ABNORMAL HIGH (ref 38–126)
BUN: 16 mg/dL (ref 6–20)
CHLORIDE: 99 mmol/L — AB (ref 101–111)
CO2: 24 mmol/L (ref 22–32)
Calcium: 7.9 mg/dL — ABNORMAL LOW (ref 8.9–10.3)
Creatinine, Ser: 1.09 mg/dL (ref 0.61–1.24)
GFR calc Af Amer: >60 mL/min (ref >60)
GFR calc non Af Amer: >60 mL/min (ref >60)
GLUCOSE: 129 mg/dL — AB (ref 65–99)
POTASSIUM: 3.4 mmol/L — AB (ref 3.5–5.1)
SODIUM: 131 mmol/L — AB (ref 135–145)
Total Bilirubin: 1.1 mg/dL (ref 0.3–1.2)
Total Protein: 5.7 g/dL — ABNORMAL LOW (ref 6.5–8.1)

## 2016-08-06 LAB — CBC
HEMATOCRIT: 32.9 % — AB (ref 39.0–52.0)
HEMOGLOBIN: 11.7 g/dL — AB (ref 13.0–17.0)
MCH: 35.2 pg — AB (ref 26.0–34.0)
MCHC: 35.6 g/dL (ref 30.0–36.0)
MCV: 99.1 fL (ref 78.0–100.0)
Platelets: 54 10*3/uL — ABNORMAL LOW (ref 150–400)
RBC: 3.32 MIL/uL — ABNORMAL LOW (ref 4.22–5.81)
RDW: 13 % (ref 11.5–15.5)
WBC: 3.7 10*3/uL — ABNORMAL LOW (ref 4.0–10.5)

## 2016-08-06 LAB — AMMONIA: Ammonia: 120 umol/L — ABNORMAL HIGH (ref 9–35)

## 2016-08-06 LAB — AFP TUMOR MARKER: AFP-Tumor Marker: 23.7 ng/mL — ABNORMAL HIGH (ref 0.0–8.3)

## 2016-08-06 MED ORDER — LACTULOSE 10 GM/15ML PO SOLN: 30.0000 g | Freq: Four times a day (QID) | ORAL | 2 refills | Status: DC

## 2016-08-06 MED ORDER — POTASSIUM CHLORIDE CRYS ER 20 MEQ PO TBCR: 40.0000 meq | EXTENDED_RELEASE_TABLET | Freq: Two times a day (BID) | ORAL | 0 refills | Status: DC

## 2016-08-06 MED ORDER — POTASSIUM CHLORIDE CRYS ER 20 MEQ PO TBCR: 40.0000 meq | EXTENDED_RELEASE_TABLET | Freq: Every day | ORAL | 0 refills | Status: DC

## 2016-08-06 MED ORDER — FUROSEMIDE 40 MG PO TABS: 40.0000 mg | ORAL_TABLET | Freq: Two times a day (BID) | ORAL | 1 refills | Status: DC

## 2016-08-06 MED ORDER — POTASSIUM CHLORIDE CRYS ER 20 MEQ PO TBCR
40.0000 meq | EXTENDED_RELEASE_TABLET | Freq: Once | ORAL | Status: AC
  Administered 2016-08-06: 40 meq via ORAL
  Filled 2016-08-06: qty 2

## 2016-08-06 MED ORDER — SPIRONOLACTONE 50 MG PO TABS: 50.0000 mg | ORAL_TABLET | Freq: Every day | ORAL | 2 refills | Status: DC

## 2016-08-06 MED ORDER — POTASSIUM CHLORIDE CRYS ER 20 MEQ PO TBCR: 40.0000 meq | EXTENDED_RELEASE_TABLET | Freq: Once | ORAL | 0 refills | Status: DC

## 2016-08-06 MED ORDER — RIFAXIMIN 550 MG PO TABS: 550.0000 mg | ORAL_TABLET | Freq: Two times a day (BID) | ORAL | 2 refills | Status: DC

## 2016-08-06 MED FILL — GENERLAC 10 GM/15 ML SOLN: 10 | 3 days supply | Qty: 500 | Fill #0

## 2016-08-06 MED FILL — POTASSIUM CL ER 20 MEQ TABL: 20 | 30 days supply | Qty: 60 | Fill #0

## 2016-08-06 MED FILL — FUROSEMIDE 40 MG TABLET: 40 | 30 days supply | Qty: 60 | Fill #0

## 2016-08-06 MED FILL — SPIRONOLACTONE 50 MG TABLET: 50 | 30 days supply | Qty: 30 | Fill #0

## 2016-08-06 NOTE — Care Management Note (Signed)
Case Management Note  Patient Details  Name: Jose [name] MRN: [id number] Date of Birth: [birth year]  Subjective/Objective:       Pt with medical history of Hep C/Etoh/Cirrhosis, Hemochromatosis, chronic thrombocytopenia, recurrent hospitalizations with hepatic encephalopathy who presented to the ED with c/o weakness and nausea. From group home ( [facility].        [name] (Sister) [place] ([phone]       Action/Plan: Plan is to d/c back to group home. Home health PT services not covered per medicaid. Pt would have to pay privately for services. Other( OT,NA)  disciplines can't be offered 2/2 there has to be a govering discipline (PT) in place. CM to make pt aware.  Expected Discharge Date:  08/06/16               Expected Discharge Plan:  Group Home ([name])  In-House Referral:  Clinical Social Work  Discharge planning Services  CM Consult  Post Acute Care Choice:    Choice offered to:     DME Arranged:    DME Agency:     HH Arranged:    HH Agency:     Status of Service:  Completed, signed off  If discussed at [employer], dates discussed:    Additional Comments:  Sharin Mons, RN 08/06/2016, 2:32 PM

## 2016-08-06 NOTE — Progress Notes (Signed)
Patient is from [facility]. Patient asked CSW to call program to let them know he is returning today. CSW called them and let them know. Patient reported his sister would drive him there at discharge.  CSW signing off.  Jose [name] Thresia Ramanathan LCSWA [phone]

## 2016-08-06 NOTE — Discharge Summary (Signed)
Physician Discharge Summary  Jose [name] MRN: [id number] DOB/AGE: 04-Dec-1955 61 y.o.  PCP: [name], MD   Admit date: 07/30/2016 Discharge date: 08/06/2016  Discharge Diagnoses:    Active Problems:   Anasarca   Metabolic encephalopathy   ARF (acute renal failure) (HCC)   Alcoholic cirrhosis of liver with ascites (HCC)   Acute-on-chronic renal failure (HCC)   Essential hypertension   Protein-calorie malnutrition, severe (HCC)   Hemochromatosis   Hepatitis C, chronic (HCC)   Hyponatremia    Follow-up recommendations Follow-up with PCP in 3-5 days , including all  additional recommended appointments as below Follow-up CBC, CMP in 3-5 days Patient discharged home with home health Patient recommended to establish with gastroenterology for his cirrhosis, hepatic encephalopathy Patient does not have an outpatient gastroenterologist Recommend low protein diet     Current Discharge Medication List    CONTINUE these medications which have CHANGED   Details  furosemide (LASIX) 40 MG tablet Take 1 tablet (40 mg total) by mouth 2 (two) times daily. Qty: 60 tablet, Refills: 1    lactulose (CHRONULAC) 10 GM/15ML solution Take 45 mLs (30 g total) by mouth 4 (four) times daily. Qty: 500 mL, Refills: 2    potassium chloride SA (K-DUR,KLOR-CON) 20 MEQ tablet Take 2 tablets (40 mEq total) by mouth once. Qty: 30 tablet, Refills: 0    rifaximin (XIFAXAN) 550 MG TABS tablet Take 1 tablet (550 mg total) by mouth 2 (two) times daily. Qty: 60 tablet, Refills: 2    spironolactone (ALDACTONE) 50 MG tablet Take 1 tablet (50 mg total) by mouth daily. Qty: 30 tablet, Refills: 2      CONTINUE these medications which have NOT CHANGED   Details  albuterol (PROVENTIL HFA;VENTOLIN HFA) 108 (90 Base) MCG/ACT inhaler Inhale 2 puffs into the lungs every 6 (six) hours as needed for wheezing or shortness of breath. Qty: 18 g, Refills: 0    fluticasone (FLONASE) 50 MCG/ACT nasal spray  Place 2 sprays into both nostrils daily.    folic acid (FOLVITE) 1 MG tablet Take 1 tablet (1 mg total) by mouth daily. Qty: 30 tablet, Refills: 0    Multiple Vitamin (MULTIVITAMIN) tablet Take 1 tablet by mouth daily.    nicotine (NICODERM CQ - DOSED IN MG/24 HOURS) 21 mg/24hr patch Place 1 patch (21 mg total) onto the skin daily. Qty: 28 patch, Refills: 0    omeprazole (PRILOSEC) 20 MG capsule Take 2 capsules (40 mg total) by mouth 2 (two) times daily before a meal. Qty: 120 capsule, Refills: 2    ondansetron (ZOFRAN) 8 MG tablet Take 8 mg by mouth every 8 (eight) hours as needed for nausea or vomiting.    traZODone (DESYREL) 100 MG tablet Take 100 mg by mouth at bedtime as needed for sleep.         Discharge Condition: Overall prognosis guarded   Discharge Instructions Get Medicines reviewed and adjusted: Please take all your medications with you for your next visit with your Primary MD  Please request your Primary MD to go over all hospital tests and procedure/radiological results at the follow up, please ask your Primary MD to get all Hospital records sent to his/her office.  If you experience worsening of your admission symptoms, develop shortness of breath, life threatening emergency, suicidal or homicidal thoughts you must seek medical attention immediately by calling 911 or calling your MD immediately if symptoms less severe.  You must read complete instructions/literature along with all the possible adverse reactions/side effects  for all the Medicines you take and that have been prescribed to you. Take any new Medicines after you have completely understood and accpet all the possible adverse reactions/side effects.   Do not drive when taking Pain medications.   Do not take more than prescribed Pain, Sleep and Anxiety Medications  Special Instructions: If you have smoked or chewed Tobacco in the last 2 yrs please stop smoking, stop any regular Alcohol and or any  Recreational drug use.  Wear Seat belts while driving.  Please note  You were cared for by a hospitalist during your hospital stay. Once you are discharged, your primary care physician will handle any further medical issues. Please note that NO REFILLS for any discharge medications will be authorized once you are discharged, as it is imperative that you return to your primary care physician (or establish a relationship with a primary care physician if you do not have one) for your aftercare needs so that they can reassess your need for medications and monitor your lab values.     No Known Allergies    Disposition: 01-Home or Self Care   Consults:  None    Significant Diagnostic Studies:  Dg Chest 2 View  Result Date: 07/15/2016 CLINICAL DATA:  Weakness EXAM: CHEST  2 VIEW COMPARISON:  1132 catheter is seen FINDINGS: Mild cardiac enlargement. Negative for heart failure. Lungs are clear without infiltrate effusion or mass. IMPRESSION: No active cardiopulmonary disease. Electronically Signed   By: Franchot Gallo M.D.   On: 07/15/2016 14:16   Ct Head Wo Contrast  Result Date: 07/15/2016 CLINICAL DATA:  Acute onset of altered mental status and nausea. Initial encounter. EXAM: CT HEAD WITHOUT CONTRAST TECHNIQUE: Contiguous axial images were obtained from the base of the skull through the vertex without intravenous contrast. COMPARISON:  CT of the head performed 05/10/2016 FINDINGS: Brain: No evidence of acute infarction, hemorrhage, hydrocephalus, extra-axial collection or mass lesion/mass effect. Mild subcortical white matter change likely reflects small vessel ischemic microangiopathy. Mild cerebellar atrophy is noted. The brainstem and fourth ventricle are within normal limits. The basal ganglia are unremarkable in appearance. The cerebral hemispheres demonstrate grossly normal gray-white differentiation. No mass effect or midline shift is seen. Vascular: No hyperdense vessel or unexpected  calcification. Skull: There is no evidence of fracture; there is chronic deformity of the medial walls of the orbits bilaterally. Sinuses/Orbits: The visualized portions of the orbits are otherwise within normal limits. The paranasal sinuses and mastoid air cells are well-aerated. Other: No significant soft tissue abnormalities are seen. IMPRESSION: 1. No acute intracranial pathology seen on CT. 2. Mild small vessel ischemic microangiopathy. Electronically Signed   By: Garald Balding M.D.   On: 07/15/2016 18:32   Mr Abdomen W Wo Contrast  Result Date: 08/06/2016 CLINICAL DATA:  Hepatic encephalopathy. History of hepatitis c, cirrhosis, hemochromatosis, chronic thrombocytopenia. EXAM: MRI ABDOMEN WITHOUT AND WITH CONTRAST TECHNIQUE: Multiplanar multisequence MR imaging of the abdomen was performed both before and after the administration of intravenous contrast. CONTRAST:  8 cc Eovist COMPARISON:  05/20/2016 CT scan, and prior MRI from 09/05/2015 FINDINGS: Lower chest: Mild cardiomegaly. Hepatobiliary: Stable findings of cirrhosis with nodular liver border and reticular architectural distortion throughout the liver. In segment 7, a 3.4 by 3.4 cm mass on image 58/1001 previously measured 3.4 by 2.8 cm on 09/05/2015. This mass has capsule and washout appearance. The increase in size does not qualify as threshold growth over the intervening 11 months. This is considered LI-RADS LR 5B. It is  worth noting that this lesion does have high precontrast T1 signal characteristics as well, but based on manual measurements does appear to enhance. There is likely some vascular shunting posteriorly in the lateral segment left hepatic lobe on image 52/1001. Dependent material in the gallbladder suspicious for gallstones. Pancreas:  Unremarkable Spleen: Scattered small varicoid nonenhancing lesions along the splenic hilum, similar to the prior MRI. Adrenals/Urinary Tract:  Unremarkable Stomach/Bowel: Suspected mild wall  thickening in the small bowel Vascular/Lymphatic: Gastric and uphill esophageal varices. Splenorenal shunting. Aortocaval node 1.3 cm in short axis on image 112/1002. Other mildly enlarged porta hepatis and retroperitoneal lymph nodes are present. Other: There is edema in the omentum and mesentery along with a trace amount of perihepatic ascites. Musculoskeletal: Small umbilical hernia contains adipose tissue. IMPRESSION: 1. The segment 7 lesion in the liver demonstrates capsule in washout appearance along with arterial phase hyperintensity. This lesion does have high precontrast T1 signal characteristics. This has mildly increased in size compared to the prior exam but does not qualify as definitive "Interval growth" threshold. Imaging appearance remains indicative of hepatocellular carcinoma, this would normally be classified LI-RADS LR 5B. Previous biopsy indicated atypical findings which were not definitive but well differentiated hepatocellular carcinoma could not be excluded. The slow growth may also be indicative of a well differentiated hepatocellular carcinoma. 2. Possible siderotic nodules along the splenic hilum. 3. Third spacing of fluid with mesenteric and omental edema and trace perihepatic ascites. There is also potentially mild wall thickening in the small bowel. 4. Hepatic cirrhosis. 5. Gastric and paraesophageal uphill varices. 6. Probable gallstones. 7. Porta hepatis and retroperitoneal lymph nodes are probably reactive. Electronically Signed   By: Gaylyn Rong M.D.   On: 08/06/2016 07:49       Filed Weights   07/31/16 1857 08/02/16 0609  Weight: 83.6 kg (184 lb 4.8 oz) 84.3 kg (185 lb 14.4 oz)     Microbiology: Recent Results (from the past 240 hour(s))  MRSA PCR Screening     Status: None   Collection Time: 07/30/16  8:57 PM  Result Value Ref Range Status   MRSA by PCR NEGATIVE NEGATIVE Final    Comment:        The GeneXpert MRSA Assay (FDA approved for NASAL  specimens only), is one component of a comprehensive MRSA colonization surveillance program. It is not intended to diagnose MRSA infection nor to guide or monitor treatment for MRSA infections.        Blood Culture    Component Value Date/Time   SDES BLOOD LEFT WRIST 05/11/2016 0417   SPECREQUEST BOTTLES DRAWN AEROBIC ONLY 5CC 05/11/2016 0417   CULT NO GROWTH 5 DAYS 05/11/2016 0417   REPTSTATUS 05/16/2016 FINAL 05/11/2016 0417      Labs: Results for orders placed or performed during the hospital encounter of 07/30/16 (from the past 48 hour(s))  Magnesium     Status: None   Collection Time: 08/04/16  9:53 AM  Result Value Ref Range   Magnesium 2.0 1.7 - 2.4 mg/dL  Comprehensive metabolic panel     Status: Abnormal   Collection Time: 08/04/16  9:53 AM  Result Value Ref Range   Sodium 128 (L) 135 - 145 mmol/L   Potassium 3.9 3.5 - 5.1 mmol/L   Chloride 99 (L) 101 - 111 mmol/L   CO2 22 22 - 32 mmol/L   Glucose, Bld 213 (H) 65 - 99 mg/dL   BUN 16 6 - 20 mg/dL   Creatinine, Ser 2.46 0.61 -  1.24 mg/dL   Calcium 8.0 (L) 8.9 - 10.3 mg/dL   Total Protein 6.6 6.5 - 8.1 g/dL   Albumin 2.3 (L) 3.5 - 5.0 g/dL   AST 108 (H) 15 - 41 U/L   ALT 72 (H) 17 - 63 U/L   Alkaline Phosphatase 276 (H) 38 - 126 U/L   Total Bilirubin 1.6 (H) 0.3 - 1.2 mg/dL   GFR calc non Af Amer >60 >60 mL/min   GFR calc Af Amer >60 >60 mL/min    Comment: (NOTE) The eGFR has been calculated using the CKD EPI equation. This calculation has not been validated in all clinical situations. eGFR's persistently <60 mL/min signify possible Chronic Kidney Disease.    Anion gap 7 5 - 15  Blood gas, arterial     Status: Abnormal   Collection Time: 08/04/16 12:10 PM  Result Value Ref Range   FIO2 21.00    pH, Arterial 7.462 (H) 7.350 - 7.450   pCO2 arterial 35.1 32.0 - 48.0 mmHg   pO2, Arterial 80.6 (L) 83.0 - 108.0 mmHg   Bicarbonate 24.7 20.0 - 28.0 mmol/L   Acid-Base Excess 1.2 0.0 - 2.0 mmol/L   O2  Saturation 95.5 %   Patient temperature 98.6    Collection site LEFT RADIAL    Sample type ARTERIAL DRAW    Allens test (pass/fail) PASS PASS  Ammonia     Status: Abnormal   Collection Time: 08/05/16  6:25 AM  Result Value Ref Range   Ammonia 139 (H) 9 - 35 umol/L  Protime-INR     Status: Abnormal   Collection Time: 08/05/16  6:25 AM  Result Value Ref Range   Prothrombin Time 15.4 (H) 11.4 - 15.2 seconds   INR 1.21   Comprehensive metabolic panel     Status: Abnormal   Collection Time: 08/05/16  6:25 AM  Result Value Ref Range   Sodium 132 (L) 135 - 145 mmol/L   Potassium 3.7 3.5 - 5.1 mmol/L   Chloride 100 (L) 101 - 111 mmol/L   CO2 26 22 - 32 mmol/L   Glucose, Bld 126 (H) 65 - 99 mg/dL   BUN 15 6 - 20 mg/dL   Creatinine, Ser 1.00 0.61 - 1.24 mg/dL   Calcium 8.0 (L) 8.9 - 10.3 mg/dL   Total Protein 5.9 (L) 6.5 - 8.1 g/dL   Albumin 2.0 (L) 3.5 - 5.0 g/dL   AST 99 (H) 15 - 41 U/L   ALT 67 (H) 17 - 63 U/L   Alkaline Phosphatase 241 (H) 38 - 126 U/L   Total Bilirubin 1.3 (H) 0.3 - 1.2 mg/dL   GFR calc non Af Amer >60 >60 mL/min   GFR calc Af Amer >60 >60 mL/min    Comment: (NOTE) The eGFR has been calculated using the CKD EPI equation. This calculation has not been validated in all clinical situations. eGFR's persistently <60 mL/min signify possible Chronic Kidney Disease.    Anion gap 6 5 - 15  AFP tumor marker     Status: Abnormal   Collection Time: 08/05/16 12:41 PM  Result Value Ref Range   AFP-Tumor Marker 23.7 (H) 0.0 - 8.3 ng/mL    Comment: (NOTE) Roche ECLIA methodology Performed At: [facility] [place], [place] [id number] Lindon Romp MD [place]:[id number]   Comprehensive metabolic panel     Status: Abnormal   Collection Time: 08/06/16  5:37 AM  Result Value Ref Range   Sodium 131 (L) 135 -  145 mmol/L   Potassium 3.4 (L) 3.5 - 5.1 mmol/L   Chloride 99 (L) 101 - 111 mmol/L   CO2 24 22 - 32 mmol/L   Glucose, Bld 129 (H) 65 - 99 mg/dL    BUN 16 6 - 20 mg/dL   Creatinine, Ser 1.09 0.61 - 1.24 mg/dL   Calcium 7.9 (L) 8.9 - 10.3 mg/dL   Total Protein 5.7 (L) 6.5 - 8.1 g/dL   Albumin 2.0 (L) 3.5 - 5.0 g/dL   AST 98 (H) 15 - 41 U/L   ALT 63 17 - 63 U/L   Alkaline Phosphatase 230 (H) 38 - 126 U/L   Total Bilirubin 1.1 0.3 - 1.2 mg/dL   GFR calc non Af Amer >60 >60 mL/min   GFR calc Af Amer >60 >60 mL/min    Comment: (NOTE) The eGFR has been calculated using the CKD EPI equation. This calculation has not been validated in all clinical situations. eGFR's persistently <60 mL/min signify possible Chronic Kidney Disease.    Anion gap 8 5 - 15  CBC     Status: Abnormal   Collection Time: 08/06/16  5:37 AM  Result Value Ref Range   WBC 3.7 (L) 4.0 - 10.5 K/uL   RBC 3.32 (L) 4.22 - 5.81 MIL/uL   Hemoglobin 11.7 (L) 13.0 - 17.0 g/dL   HCT 32.9 (L) 39.0 - 52.0 %   MCV 99.1 78.0 - 100.0 fL   MCH 35.2 (H) 26.0 - 34.0 pg   MCHC 35.6 30.0 - 36.0 g/dL   RDW 13.0 11.5 - 15.5 %   Platelets 54 (L) 150 - 400 K/uL    Comment: CONSISTENT WITH PREVIOUS RESULT  Ammonia     Status: Abnormal   Collection Time: 08/06/16  6:35 AM  Result Value Ref Range   Ammonia 120 (H) 9 - 35 umol/L     Lipid Panel     Component Value Date/Time   CHOL 95 06/06/2013 1211   TRIG 94 06/06/2013 1211   HDL 32 (L) 06/06/2013 1211   CHOLHDL 3.0 06/06/2013 1211   VLDL 19 06/06/2013 1211   LDLCALC 44 06/06/2013 1211     Lab Results  Component Value Date   HGBA1C 5.2 07/15/2016       HPI    60 y.o.malewith medical history significant of Hep C/Etoh/Cirrhosis, Hemochromatosis, chronic thrombocytopenia, recurrent hospitalizations with hepatic encephalopathy, noncompliance presents to the ER with nausea, weakness. He currently lives in a group home by the name of:"[facility]", he denies ongoing alcohol use  ED Course: As noted to have profound hyponatremia sodium of 121 compared to normal baseline, potassium was 6.2 however specimen was  hemolyzed and repeat labs,  BUN is 25, creatinine of 1.4. His bili is slightly elevated compared to baseline at 2.1 and AST/ALT are slightly higher than previous baseline too  HOSPITAL COURSE:    # Acute hepatitic encephalopathy: -Patient had ammonia level of 127 on admission.  Increased   from 103-121>139>120 Unable to bring it down any further with increasing doses of lactulose, and Xifaxan. called GI on call and he recommended Low-protein diet,  -Mentally status waxing and waning Requested GI to evaluate, consult was not completed in the hospital, strongly recommend patient to be seen in the outpatient setting by GI Recheck MRI of the abdomen due to concern for hepatoma-nondiagnostic-apparently previous biopsy indicated atypical findings which were not definitive. Cannot rule out slow-growing well-differentiated hepatocellular carcinoma. Patient did have hepatic cirrhosis gastric and paraesophageal varices,  ABG  pH 7.4, PCO2 is 35, PO2 80 At this point patient recommended to stay on a low-protein diet and be seen by GI in the outpatient setting   #Hyponatremia likely subacute in the setting of liver cirrhosis: -Very very slow improvement  in serum sodium , now 128>132. Urine osmolality is elevated with elevated urine sodium level. Received NS which was discontinued. Increased home dose of    lasix to 40 mg twice a day in addition to Aldactone, placed on fluid restriction 1200 cc   Continue to monitor electrolytes closely in the outpatient setting  -discussed with the patient regarding fluid restriction at bedside. He verbalized understanding.   #Hyperkalemia/hypokalemia: Serum potassium level improved. Monitor labs. While on Aldactone. Monitor potassium levels closely  #Acute kidney injury: Serum creatinine level improved. Continue to monitor.  #Alcohol liver cirrhosis with ascites: Continue to closely follow liver function, PT/INR, Continue lasix, aldactone Xifaxan and lactulose.  Continue vitamins.  #Severe protein calorie malnutrition: Dietary referral, low-protein diet recommended  #History of hemochromatosis: Follows up with Dr. Alvy Bimler  #History of hepatitis C, chronic: Recommended outpatient follow-up.   #Chronic thrombocytopenia: No active bleeding, platelets range from 54-81  Discharge Exam: *  Blood pressure 110/65, pulse 65, temperature 98.4 F (36.9 C), resp. rate 18, height '5\' 6"'$  (1.676 m), weight 84.3 kg (185 lb 14.4 oz), SpO2 100 %. General exam: Lying on bed, not in distress Respiratory system: Clear bilateral. Respiratory effort normal. No wheezing or crackle Cardiovascular system: S1 & S2 heard, RRR.  No pedal edema. Gastrointestinal system: Abdomen soft, nontender, bowel sound positive, mild distention which is stable. Central nervous system: Alert and oriented. No focal neurological deficits. Extremities: Symmetric 5 x 5 power. Skin: No rashes, lesions or ulcers Psychiatry: Judgement and insight appear normal. Mood & affect appropriate.      Follow-up Information    OSEI-BONSU,GEORGE, MD. Call.   Specialty:  Internal Medicine Why:  in 3-5 days ,check ammonia, cmp , K  Contact information: [address] [place] [address] [phone]           Signed: Reyne Dumas 08/06/2016, 8:53 AM        Time spent >45 mins

## 2016-08-06 NOTE — Progress Notes (Signed)
Pt given discharge instructions, prescriptions, and care notes. Pt verbalized understanding AEB no further questions or concerns at this time. IV was discontinued, no redness, pain, or swelling noted at this time. Telemetry discontinued and Centralized Telemetry was notified. Pt left the floor via wheelchair with staff in stable condition. 

## 2016-08-06 NOTE — Progress Notes (Signed)
qPhysical Therapy Treatment Patient Details Name: Jose [name] MRN: [id number] DOB: 04/23/1956 Today's Date: 08/06/2016    History of Present Illness 61 y.o. male with medical history significant of Hep C/Etoh/Cirrhosis, Hemochromatosis, chronic thrombocytopenia, recurrent hospitalizations with hepatic encephalopathy who presented to the ED with c/o weakness and nausea.    PT Comments    Good progress noted. Updated home equipment needs to 3n1 only. RW no longer indicated. Stair training complete.   Follow Up Recommendations  Home health PT;Supervision/Assistance - 24 hour     Equipment Recommendations  3in1 (PT)    Recommendations for Other Services       Precautions / Restrictions Precautions Precautions: Fall    Mobility  Bed Mobility Overal bed mobility: Modified Independent       Supine to sit: Modified independent (Device/Increase time);HOB elevated Sit to supine: Modified independent (Device/Increase time);HOB elevated      Transfers   Equipment used: None   Sit to Stand: Supervision         General transfer comment: supervision for safety only  Ambulation/Gait Ambulation/Gait assistance: Supervision Ambulation Distance (Feet): 300 Feet Assistive device: None Gait Pattern/deviations: Step-through pattern;Decreased stride length;Wide base of support   Gait velocity interpretation: Below normal speed for age/gender General Gait Details: mild unsteadiness, no physical assist needed   Stairs Stairs: Yes   Stair Management: Alternating pattern;Forwards;One rail Left;Step to pattern Number of Stairs: 12 General stair comments: alternating pattern wit ascent and step to pattern with descent  Wheelchair Mobility    Modified Rankin (Stroke Patients Only)       Balance Overall balance assessment: History of Falls Sitting-balance support: Feet supported;No upper extremity supported Sitting balance-Leahy Scale: Normal     Standing balance  support: During functional activity;No upper extremity supported Standing balance-Leahy Scale: Good                              Cognition Arousal/Alertness: Awake/alert Behavior During Therapy: WFL for tasks assessed/performed Overall Cognitive Status: Within Functional Limits for tasks assessed                                        Exercises      General Comments        Pertinent Vitals/Pain Pain Assessment: No/denies pain    Home Living                      Prior Function            PT Goals (current goals can now be found in the care plan section) Acute Rehab PT Goals Patient Stated Goal: get stronger PT Goal Formulation: With patient Time For Goal Achievement: 08/15/16 Potential to Achieve Goals: Good Progress towards PT goals: Progressing toward goals    Frequency    Min 3X/week      PT Plan Equipment recommendations need to be updated    Co-evaluation             End of Session Equipment Utilized During Treatment: Gait belt Activity Tolerance: Patient tolerated treatment well Patient left: in bed;with call bell/phone within reach Nurse Communication: Mobility status PT Visit Diagnosis: Unsteadiness on feet (R26.81);Muscle weakness (generalized) (M62.81)     Time: 2353-6144 PT Time Calculation (min) (ACUTE ONLY): 9 min  Charges:  $Gait Training: 8-22 mins  G Codes:       Lorrin Goodell, PT  Office # [phone] Pager [phone]    Lorriane Shire 08/06/2016, 9:18 AM

## 2016-08-06 NOTE — Progress Notes (Signed)
Occupational Therapy Treatment Patient Details Name: Jose [name] MRN: [id number] DOB: 1956/02/20 Today's Date: 08/06/2016    History of present illness 61 y.o. male with medical history significant of Hep C/Etoh/Cirrhosis, Hemochromatosis, chronic thrombocytopenia, recurrent hospitalizations with hepatic encephalopathy who presented to the ED with c/o weakness and nausea.   OT comments  Pt making progress towards OT goals this session. Session focused on toilet transfer and care, sink level grooming, and whole body dressing. Pt at appropriate level for discharge - Pt with no further questions or concerns for OT at end of session. OT will continue to follow in acute setting.  Follow Up Recommendations  No OT follow up;Supervision - Intermittent    Equipment Recommendations  None recommended by OT    Recommendations for Other Services      Precautions / Restrictions Precautions Precautions: Fall Restrictions Weight Bearing Restrictions: No       Mobility Bed Mobility Overal bed mobility: Modified Independent Bed Mobility: Supine to Sit     Supine to sit: Modified independent (Device/Increase time);HOB elevated Sit to supine: Modified independent (Device/Increase time);HOB elevated   General bed mobility comments: HOB elevated no physical assistance needed, + use of rail.    Transfers Overall transfer level: Needs assistance Equipment used: None Transfers: Sit to/from Stand Sit to Stand: Supervision         General transfer comment: supervision for safety only    Balance Overall balance assessment: History of Falls Sitting-balance support: Feet supported;No upper extremity supported Sitting balance-Leahy Scale: Normal Sitting balance - Comments: able to perform LB dressing sitting edge of recliner   Standing balance support: During functional activity;No upper extremity supported Standing balance-Leahy Scale: Good Standing balance comment: able to perform sink  level grooming and rear peri care                           ADL either performed or assessed with clinical judgement   ADL Overall ADL's : Needs assistance/impaired Eating/Feeding: Modified independent;Sitting   Grooming: Wash/dry hands;Wash/dry face;Applying deodorant;Min guard;Standing Grooming Details (indicate cue type and reason): sink level, supervision for safety Upper Body Bathing: Modified independent;Sitting Upper Body Bathing Details (indicate cue type and reason): Pt uses shower chair at baseline Lower Body Bathing: Supervison/ safety;Sit to/from stand   Upper Body Dressing : Modified independent;Standing;Sitting Upper Body Dressing Details (indicate cue type and reason): Able to find clothing in bag and transition between sitting and standing for UB dressing Lower Body Dressing: Minimal assistance;Sit to/from stand Lower Body Dressing Details (indicate cue type and reason): only assist needed was with right shoe, because the back kept bending down. Talked about long handle shoe horn with Pt as way to make it easier, Pt declined Toilet Transfer: Supervision/safety;Ambulation;Regular Toilet;Grab bars Toilet Transfer Details (indicate cue type and reason): into bathroom, discussed 3 in 1 for increased balance and safety - Pt agreeable and excited about it Toileting- Clothing Manipulation and Hygiene: Modified independent;Sit to/from stand Toileting - Clothing Manipulation Details (indicate cue type and reason): hospital gown and back peri care with no assist     Functional mobility during ADLs: Min guard General ADL Comments: Pt reports that he is back to baseline, overall supervision - which he states he can get help at the group home if he needs it.     Vision Patient Visual Report: No change from baseline Vision Assessment?: No apparent visual deficits Additional Comments: Able to sort through bags of items and decifer grooming  items at sink. No problems  navigating the room   Perception     Praxis      Cognition Arousal/Alertness: Awake/alert Behavior During Therapy: WFL for tasks assessed/performed Overall Cognitive Status: Within Functional Limits for tasks assessed                                          Exercises     Shoulder Instructions       General Comments      Pertinent Vitals/ Pain       Pain Assessment: No/denies pain  Home Living Family/patient expects to be discharged to:: Group home Living Arrangements: Other (Comment);Group Home   Type of Home: Apartment Home Access: Level entry     Home Layout: One level     Bathroom Shower/Tub: Occupational psychologist: Standard     Home Equipment: Clinical cytogeneticist - 2 wheels;Cane - single point   Additional Comments: plans to go to drug addiction rehab center, has sister assisting PRN      Prior Functioning/Environment Level of Independence: Independent        Comments: uses shower chair for bathing   Frequency  Min 2X/week        Progress Toward Goals  OT Goals(current goals can now be found in the care plan section)  Progress towards OT goals: Progressing toward goals  Acute Rehab OT Goals Patient Stated Goal: get stronger OT Goal Formulation: With patient Time For Goal Achievement: 08/15/16 Potential to Achieve Goals: Good  Plan Discharge plan remains appropriate;Frequency remains appropriate    Co-evaluation                 End of Session Equipment Utilized During Treatment: Gait belt  OT Visit Diagnosis: Unsteadiness on feet (R26.81)   Activity Tolerance Patient tolerated treatment well   Patient Left in chair;with call bell/phone within reach;with chair alarm set   Nurse Communication Mobility status        Time: 8657-8469 OT Time Calculation (min): 26 min  Charges: OT General Charges $OT Visit: 1 Procedure OT Treatments $Self Care/Home Management : 23-37 mins  Hulda Humphrey  OTR/L Cro[place] 08/06/2016, 12:38 PM

## 2016-08-14 ENCOUNTER — Encounter (HOSPITAL_COMMUNITY): Payer: Self-pay

## 2016-08-14 ENCOUNTER — Emergency Department (HOSPITAL_COMMUNITY): Payer: Medicaid Other

## 2016-08-14 ENCOUNTER — Inpatient Hospital Stay (HOSPITAL_COMMUNITY)
Admission: EM | Admit: 2016-08-14 | Discharge: 2016-08-18 | DRG: 441 | Disposition: A | Discharge status: Home / Self Care | Payer: Medicaid Other | Attending: Internal Medicine | Admitting: Internal Medicine

## 2016-08-14 DIAGNOSIS — K7460 Unspecified cirrhosis of liver: Secondary | ICD-10-CM | POA: Diagnosis not present

## 2016-08-14 DIAGNOSIS — I10 Essential (primary) hypertension: Secondary | ICD-10-CM | POA: Diagnosis present

## 2016-08-14 DIAGNOSIS — K7290 Hepatic failure, unspecified without coma: Secondary | ICD-10-CM | POA: Diagnosis present

## 2016-08-14 DIAGNOSIS — F17200 Nicotine dependence, unspecified, uncomplicated: Secondary | ICD-10-CM | POA: Diagnosis present

## 2016-08-14 DIAGNOSIS — R188 Other ascites: Secondary | ICD-10-CM

## 2016-08-14 DIAGNOSIS — Z8249 Family history of ischemic heart disease and other diseases of the circulatory system: Secondary | ICD-10-CM | POA: Diagnosis not present

## 2016-08-14 DIAGNOSIS — F1020 Alcohol dependence, uncomplicated: Secondary | ICD-10-CM | POA: Diagnosis present

## 2016-08-14 DIAGNOSIS — Z9119 Patient's noncompliance with other medical treatment and regimen: Secondary | ICD-10-CM

## 2016-08-14 DIAGNOSIS — E43 Unspecified severe protein-calorie malnutrition: Secondary | ICD-10-CM | POA: Diagnosis present

## 2016-08-14 DIAGNOSIS — R160 Hepatomegaly, not elsewhere classified: Secondary | ICD-10-CM | POA: Diagnosis not present

## 2016-08-14 DIAGNOSIS — K7031 Alcoholic cirrhosis of liver with ascites: Secondary | ICD-10-CM | POA: Diagnosis present

## 2016-08-14 DIAGNOSIS — Z79899 Other long term (current) drug therapy: Secondary | ICD-10-CM | POA: Diagnosis not present

## 2016-08-14 DIAGNOSIS — B182 Chronic viral hepatitis C: Secondary | ICD-10-CM | POA: Diagnosis present

## 2016-08-14 DIAGNOSIS — Z515 Encounter for palliative care: Secondary | ICD-10-CM | POA: Diagnosis not present

## 2016-08-14 DIAGNOSIS — Z7189 Other specified counseling: Secondary | ICD-10-CM

## 2016-08-14 DIAGNOSIS — Z6829 Body mass index (BMI) 29.0-29.9, adult: Secondary | ICD-10-CM

## 2016-08-14 DIAGNOSIS — D696 Thrombocytopenia, unspecified: Secondary | ICD-10-CM | POA: Diagnosis present

## 2016-08-14 DIAGNOSIS — K7682 Hepatic encephalopathy: Secondary | ICD-10-CM

## 2016-08-14 DIAGNOSIS — K4090 Unilateral inguinal hernia, without obstruction or gangrene, not specified as recurrent: Secondary | ICD-10-CM | POA: Diagnosis present

## 2016-08-14 DIAGNOSIS — N179 Acute kidney failure, unspecified: Secondary | ICD-10-CM | POA: Diagnosis present

## 2016-08-14 DIAGNOSIS — E860 Dehydration: Secondary | ICD-10-CM | POA: Diagnosis present

## 2016-08-14 DIAGNOSIS — D539 Nutritional anemia, unspecified: Secondary | ICD-10-CM | POA: Diagnosis present

## 2016-08-14 DIAGNOSIS — E871 Hypo-osmolality and hyponatremia: Secondary | ICD-10-CM | POA: Diagnosis present

## 2016-08-14 DIAGNOSIS — R001 Bradycardia, unspecified: Secondary | ICD-10-CM | POA: Diagnosis present

## 2016-08-14 LAB — URINALYSIS, ROUTINE W REFLEX MICROSCOPIC
BILIRUBIN URINE: NEGATIVE
Glucose, UA: NEGATIVE mg/dL
Ketones, ur: NEGATIVE mg/dL
Leukocytes, UA: NEGATIVE
Nitrite: NEGATIVE
PH: 6 (ref 5.0–8.0)
Protein, ur: NEGATIVE mg/dL
SPECIFIC GRAVITY, URINE: 1.006 (ref 1.005–1.030)

## 2016-08-14 LAB — CBC WITH DIFFERENTIAL/PLATELET
Basophils Absolute: 0 10*3/uL (ref 0.0–0.1)
Basophils Relative: 1 %
EOS ABS: 0.1 10*3/uL (ref 0.0–0.7)
EOS PCT: 2 %
HCT: 39.7 % (ref 39.0–52.0)
HEMOGLOBIN: 14.2 g/dL (ref 13.0–17.0)
LYMPHS ABS: 0.6 10*3/uL — AB (ref 0.7–4.0)
LYMPHS PCT: 18 %
MCH: 34.9 pg — AB (ref 26.0–34.0)
MCHC: 35.8 g/dL (ref 30.0–36.0)
MCV: 97.5 fL (ref 78.0–100.0)
MONOS PCT: 25 %
Monocytes Absolute: 0.8 10*3/uL (ref 0.1–1.0)
Neutro Abs: 1.8 10*3/uL (ref 1.7–7.7)
Neutrophils Relative %: 54 %
PLATELETS: 59 10*3/uL — AB (ref 150–400)
RBC: 4.07 MIL/uL — ABNORMAL LOW (ref 4.22–5.81)
RDW: 13.8 % (ref 11.5–15.5)
WBC: 3.4 10*3/uL — ABNORMAL LOW (ref 4.0–10.5)

## 2016-08-14 LAB — COMPREHENSIVE METABOLIC PANEL
ALK PHOS: 191 U/L — AB (ref 38–126)
ALT: 64 U/L — ABNORMAL HIGH (ref 17–63)
AST: 96 U/L — AB (ref 15–41)
Albumin: 2.6 g/dL — ABNORMAL LOW (ref 3.5–5.0)
Anion gap: 8 (ref 5–15)
BUN: 27 mg/dL — ABNORMAL HIGH (ref 6–20)
CALCIUM: 8.5 mg/dL — AB (ref 8.9–10.3)
CHLORIDE: 105 mmol/L (ref 101–111)
CO2: 18 mmol/L — AB (ref 22–32)
CREATININE: 1.67 mg/dL — AB (ref 0.61–1.24)
GFR calc Af Amer: 50 mL/min — ABNORMAL LOW (ref >60)
GFR calc non Af Amer: 43 mL/min — ABNORMAL LOW (ref >60)
Glucose, Bld: 109 mg/dL — ABNORMAL HIGH (ref 65–99)
Potassium: 4.8 mmol/L (ref 3.5–5.1)
SODIUM: 131 mmol/L — AB (ref 135–145)
Total Bilirubin: 1.8 mg/dL — ABNORMAL HIGH (ref 0.3–1.2)
Total Protein: 7.5 g/dL (ref 6.5–8.1)

## 2016-08-14 LAB — RAPID URINE DRUG SCREEN, HOSP PERFORMED
Amphetamines: NOT DETECTED
BARBITURATES: NOT DETECTED
Benzodiazepines: NOT DETECTED
Cocaine: NOT DETECTED
Opiates: NOT DETECTED
TETRAHYDROCANNABINOL: NOT DETECTED

## 2016-08-14 LAB — LIPASE, BLOOD: LIPASE: 57 U/L — AB (ref 11–51)

## 2016-08-14 LAB — I-STAT TROPONIN, ED: TROPONIN I, POC: 0 ng/mL (ref 0.00–0.08)

## 2016-08-14 LAB — MAGNESIUM: Magnesium: 2.2 mg/dL (ref 1.7–2.4)

## 2016-08-14 LAB — ETHANOL: Alcohol, Ethyl (B): <5 mg/dL (ref <5)

## 2016-08-14 LAB — AMMONIA: AMMONIA: 137 umol/L — AB (ref 9–35)

## 2016-08-14 MED ORDER — ONDANSETRON HCL 4 MG PO TABS: 4.0000 mg | ORAL_TABLET | Freq: Four times a day (QID) | ORAL | Status: DC | PRN

## 2016-08-14 MED ORDER — FOLIC ACID 1 MG PO TABS
1.0000 mg | ORAL_TABLET | Freq: Every day | ORAL | Status: DC
  Administered 2016-08-15 – 2016-08-18 (×4): 1 mg via ORAL
  Filled 2016-08-14 (×4): qty 1

## 2016-08-14 MED ORDER — ONDANSETRON HCL 4 MG/2ML IJ SOLN: 4.0000 mg | Freq: Four times a day (QID) | INTRAMUSCULAR | Status: DC | PRN

## 2016-08-14 MED ORDER — SODIUM CHLORIDE 0.9 % IV SOLN
INTRAVENOUS | Status: DC
  Administered 2016-08-14 – 2016-08-15 (×2): via INTRAVENOUS

## 2016-08-14 MED ORDER — RIFAXIMIN 550 MG PO TABS
550.0000 mg | ORAL_TABLET | Freq: Two times a day (BID) | ORAL | Status: DC
  Administered 2016-08-14 – 2016-08-18 (×8): 550 mg via ORAL
  Filled 2016-08-14 (×8): qty 1

## 2016-08-14 MED ORDER — METHOCARBAMOL 1000 MG/10ML IJ SOLN
500.0000 mg | Freq: Four times a day (QID) | INTRAMUSCULAR | Status: DC | PRN
Start: 2016-08-14 — End: 2016-08-15
  Administered 2016-08-14: 500 mg via INTRAVENOUS
  Filled 2016-08-14 (×2): qty 5

## 2016-08-14 MED ORDER — ACETAMINOPHEN 650 MG RE SUPP: 650.0000 mg | Freq: Four times a day (QID) | RECTAL | Status: DC | PRN

## 2016-08-14 MED ORDER — MAGNESIUM SULFATE 2 GM/50ML IV SOLN
2.0000 g | Freq: Once | INTRAVENOUS | Status: AC
  Administered 2016-08-14: 2 g via INTRAVENOUS
  Filled 2016-08-14: qty 50

## 2016-08-14 MED ORDER — NICOTINE 21 MG/24HR TD PT24
21.0000 mg | MEDICATED_PATCH | Freq: Every day | TRANSDERMAL | Status: DC
  Administered 2016-08-14 – 2016-08-18 (×5): 21 mg via TRANSDERMAL
  Filled 2016-08-14 (×5): qty 1

## 2016-08-14 MED ORDER — FLUTICASONE PROPIONATE 50 MCG/ACT NA SUSP
2.0000 | Freq: Every day | NASAL | Status: DC
  Administered 2016-08-18: 2 via NASAL
  Filled 2016-08-14: qty 16

## 2016-08-14 MED ORDER — LACTULOSE 10 GM/15ML PO SOLN
20.0000 g | Freq: Once | ORAL | Status: AC
  Administered 2016-08-14: 20 g via ORAL
  Filled 2016-08-14: qty 30

## 2016-08-14 MED ORDER — ADULT MULTIVITAMIN W/MINERALS CH
1.0000 | ORAL_TABLET | Freq: Every day | ORAL | Status: DC
  Administered 2016-08-15 – 2016-08-18 (×4): 1 via ORAL
  Filled 2016-08-14 (×4): qty 1

## 2016-08-14 MED ORDER — LACTULOSE 10 GM/15ML PO SOLN
30.0000 g | Freq: Four times a day (QID) | ORAL | Status: DC
  Administered 2016-08-14 – 2016-08-16 (×9): 30 g via ORAL
  Filled 2016-08-14 (×9): qty 45

## 2016-08-14 MED ORDER — PANTOPRAZOLE SODIUM 40 MG PO TBEC
40.0000 mg | DELAYED_RELEASE_TABLET | Freq: Every day | ORAL | Status: DC
  Administered 2016-08-15 – 2016-08-18 (×4): 40 mg via ORAL
  Filled 2016-08-14 (×4): qty 1

## 2016-08-14 MED ORDER — ALBUTEROL SULFATE (2.5 MG/3ML) 0.083% IN NEBU: 3.0000 mL | INHALATION_SOLUTION | Freq: Four times a day (QID) | RESPIRATORY_TRACT | Status: DC | PRN

## 2016-08-14 MED ORDER — ACETAMINOPHEN 325 MG PO TABS
650.0000 mg | ORAL_TABLET | Freq: Four times a day (QID) | ORAL | Status: DC | PRN
  Administered 2016-08-14: 650 mg via ORAL
  Filled 2016-08-14 (×2): qty 2

## 2016-08-14 NOTE — ED Triage Notes (Signed)
Pt brought in from rehab center by EMS. Pt here for AMS. Pt is lethargic, but arousable by voice. Pt a&ox2. VSS and no distressed noted. Pt received 500cc of NS enroute.

## 2016-08-14 NOTE — ED Notes (Signed)
Patient transported to CT 

## 2016-08-14 NOTE — ED Notes (Signed)
Attempted report to 5W. 

## 2016-08-14 NOTE — ED Provider Notes (Signed)
Jose [name] DEPT Provider Note   CSN: [id number] Arrival date & time: 08/14/16  1140     History   Chief Complaint Chief Complaint  Patient presents with  . Altered Mental Status    HPI PRAKASH KIMBERLING is a 61 y.o. male.  The history is provided by the patient and medical records. No language interpreter was used.  Altered Mental Status     Jose [name] is a 61 y.o. male  with a PMH of alcoholism, cirrhosis, HepC, HTN who presents to the Emergency Department from drug rehab facility for being more lethargic than usual. 500cc NS given en route. Unable to obtain much history as no one is at the bedside and little report given to nursing staff by EMS. Per chart review, patient does have history of alcoholic cirrhosis and presented with altered mental status in the past - found to have elevated ammonia and admitted for hepatic encephalopathy. Last admission on 4/04 and subsequently discharged on 4/11.    Level V caveat due to mental status change.   Past Medical History:  Diagnosis Date  . Alcohol abuse   . Alcohol dependence ([place])   . Alcoholism ([place]) 07/12/2015  . Ascites   . Cirrhosis ([place])   . Depression   . Hepatitis C   . Hypertension   . QT prolongation   . Shortness of breath    with exertion  . Thrombocytopenia [facility])     Patient Active Problem List   Diagnosis Date Noted  . Acute kidney injury ([place]) 08/14/2016  . Chronic hyponatremia 07/30/2016  . Chronic hepatitis C without hepatic coma ([place]) 06/06/2016  . Elevated lactic acid level 05/10/2016  . Acute hepatic encephalopathy 04/26/2016  . 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 Hepatitis C, chronic ([place]) 01/04/2016  . Hernia, inguinal, right 09/12/2015  . Essential hypertension 09/12/2015  . Gastroesophageal reflux disease without esophagitis 09/12/2015  . Protein-calorie malnutrition, severe ([place]) 09/12/2015  . Anemia of chronic disease 09/12/2015  . Arthritis of left knee 09/12/2015  . Cirrhosis of liver with  ascites ([place])   . Elevated LFTs   . Weakness 09/03/2015  . Generalized weakness   . Physical deconditioning   . Acute-on-chronic renal failure ([place])   . Alcoholic cirrhosis of liver with ascites ([place]) 07/25/2015  . Macrocytic anemia 07/23/2015  . Alcoholism ([place]) 07/12/2015  . ARF (acute renal failure) ([place]) 06/27/2015  . Hepatic encephalopathy ([place]) 06/26/2015  . Trichomonas infection 06/26/2015  . 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 Adenomatous polyps 10/18/2013  . Unspecified vitamin D deficiency 07/13/2013  . Atopic dermatitis 04/28/2013  . Thrombocytopenia ([place]) 04/28/2013  . Anasarca 04/26/2013  . UTI (urinary tract infection) 04/26/2013    Past Surgical History:  Procedure Laterality Date  . CIRCUMCISION    . COLONOSCOPY  march 2015   Dr. Renee Harder: nodular mucosa at appendiceal orifice, tubular adenoma, extremely poor prep  . COLONOSCOPY         Home Medications    Prior to Admission medications   Medication Sig Start Date End Date Taking? Authorizing Provider  albuterol (PROVENTIL HFA;VENTOLIN HFA) 108 (90 Base) MCG/ACT inhaler Inhale 2 puffs into the lungs every 6 (six) hours as needed for wheezing or shortness of breath. 07/18/16   Eugenie Filler, MD  fluticasone (FLONASE) 50 MCG/ACT nasal spray Place 2 sprays into both nostrils daily.    Historical Provider, MD  folic acid (FOLVITE) 1 MG tablet Take 1 tablet (1 mg total) by mouth daily. 06/09/16   Shanker  Kristeen Mans, MD  furosemide (LASIX) 40 MG tablet Take 1 tablet (40 mg total) by mouth 2 (two) times daily. 08/06/16   Reyne Dumas, MD  lactulose (CHRONULAC) 10 GM/15ML solution Take 45 mLs (30 g total) by mouth 4 (four) times daily. 08/06/16   Reyne Dumas, MD  Multiple Vitamin (MULTIVITAMIN) tablet Take 1 tablet by mouth daily.    Historical Provider, MD  nicotine (NICODERM CQ - DOSED IN MG/24 HOURS) 21 mg/24hr patch Place 1 patch (21 mg total) onto the skin daily. 07/18/16   Eugenie Filler, MD  omeprazole  (PRILOSEC) 20 MG capsule Take 2 capsules (40 mg total) by mouth 2 (two) times daily before a meal. 07/18/16   Eugenie Filler, MD  ondansetron (ZOFRAN) 8 MG tablet Take 8 mg by mouth every 8 (eight) hours as needed for nausea or vomiting.    Historical Provider, MD  potassium chloride SA (K-DUR,KLOR-CON) 20 MEQ tablet Take 2 tablets (40 mEq total) by mouth daily. 08/06/16   Reyne Dumas, MD  rifaximin (XIFAXAN) 550 MG TABS tablet Take 1 tablet (550 mg total) by mouth 2 (two) times daily. 08/06/16   Reyne Dumas, MD  spironolactone (ALDACTONE) 50 MG tablet Take 1 tablet (50 mg total) by mouth daily. 08/06/16   Reyne Dumas, MD  traZODone (DESYREL) 100 MG tablet Take 100 mg by mouth at bedtime as needed for sleep.    Historical Provider, MD    Family History Family History  Problem Relation Age of Onset  . Breast cancer Other   . Diabetes Other   . Hypertension Other   . Breast cancer Mother   . Hypertension Mother   . Diabetes Father   . Hypertension Sister   . Heart disease Sister   . Hypertension Paternal Grandmother   . Colon cancer Neg Hx     Social History Social History  Substance Use Topics  . Smoking status: Current Some Day Smoker  . Smokeless tobacco: Never Used  . Alcohol use Yes     Comment: A couple beers every few days      Allergies   Patient has no known allergies.   Review of Systems Review of Systems  All other systems reviewed and are negative.    Physical Exam Updated Vital Signs BP 101/69   Pulse (!) 56   Temp 98.2 F (36.8 C) (Rectal)   Resp 14   Ht 5\' 6"  (1.676 m)   Wt 81.6 kg   SpO2 99%   BMI 29.05 kg/m   Physical Exam  Constitutional: He is oriented to person, place, and time. He appears well-developed and well-nourished. No distress.  Lethargic, but arousable to voice.  HENT:  Head: Normocephalic and atraumatic.  Cardiovascular: Normal rate, regular rhythm and normal heart sounds.   No murmur heard. Pulmonary/Chest: Effort normal  and breath sounds normal. No respiratory distress.  Abdominal: Soft. He exhibits no distension. There is no tenderness.  Musculoskeletal:  Moves all extremities well.   Neurological: He is alert and oriented to person, place, and time.  Alert to person and place but believes year is "19". CN 2-12 intact. Will follow simple commands.   Skin: Skin is warm and dry.  Nursing note and vitals reviewed.    ED Treatments / Results  Labs (all labs ordered are listed, but only abnormal results are displayed) Labs Reviewed  COMPREHENSIVE METABOLIC PANEL - Abnormal; Notable for the following:       Result Value   Sodium 131 (*)  CO2 18 (*)    Glucose, Bld 109 (*)    BUN 27 (*)    Creatinine, Ser 1.67 (*)    Calcium 8.5 (*)    Albumin 2.6 (*)    AST 96 (*)    ALT 64 (*)    Alkaline Phosphatase 191 (*)    Total Bilirubin 1.8 (*)    GFR calc non Af Amer 43 (*)    GFR calc Af Amer 50 (*)    All other components within normal limits  CBC WITH DIFFERENTIAL/PLATELET - Abnormal; Notable for the following:    WBC 3.4 (*)    RBC 4.07 (*)    MCH 34.9 (*)    Platelets 59 (*)    Lymphs Abs 0.6 (*)    All other components within normal limits  AMMONIA - Abnormal; Notable for the following:    Ammonia 137 (*)    All other components within normal limits  URINALYSIS, ROUTINE W REFLEX MICROSCOPIC - Abnormal; Notable for the following:    Color, Urine STRAW (*)    Hgb urine dipstick SMALL (*)    Bacteria, UA RARE (*)    Squamous Epithelial / LPF 0-5 (*)    Non Squamous Epithelial 0-5 (*)    All other components within normal limits  LIPASE, BLOOD - Abnormal; Notable for the following:    Lipase 57 (*)    All other components within normal limits  ETHANOL  RAPID URINE DRUG SCREEN, HOSP PERFORMED  I-STAT TROPOININ, ED    EKG  EKG Interpretation None       Radiology Ct Head Wo Contrast  Result Date: 08/14/2016 CLINICAL DATA:  Dizziness since earlier today.  Lethargy. EXAM: CT HEAD  WITHOUT CONTRAST TECHNIQUE: Contiguous axial images were obtained from the base of the skull through the vertex without intravenous contrast. COMPARISON:  07/15/2016. FINDINGS: Brain: Diffusely enlarged ventricles and subarachnoid spaces. Patchy white matter low density in both cerebral hemispheres. No intracranial hemorrhage, mass lesion or CT evidence of acute infarction. Vascular: No hyperdense vessel or unexpected calcification. Skull: Normal. Negative for fracture or focal lesion. Sinuses/Orbits: Unremarkable. Other: None. IMPRESSION: 1. No acute abnormality. 2. Stable mild diffuse cerebral and cerebellar atrophy and mild chronic small vessel white matter ischemic changes in both cerebral hemispheres. Electronically Signed   By: Claudie Revering M.D.   On: 08/14/2016 12:37    Procedures Procedures (including critical care time)  Medications Ordered in ED Medications  0.9 %  sodium chloride infusion (not administered)  lactulose (CHRONULAC) 10 GM/15ML solution 20 g (20 g Oral Given 08/14/16 1333)     Initial Impression / Assessment and Plan / ED Course  I have reviewed the triage vital signs and the nursing notes.  Pertinent labs & imaging results that were available during my care of the patient were reviewed by me and considered in my medical decision making (see chart for details).    Jose [name] is a 61 y.o. male who [facility] ED  [facility] for [facility] status Per EMS, facility reported that he was more lethargic than usual.He does have a history of alcoholic cirrhosis and has had multiple admissions for hepatic encephalopathy in the past. On exam, patient is arousable with physical stimuli and cranial nerves are intact.  He is alert and oriented to person and place, however believes it is "19" as th year. Labs reviewed with elevated ammonia at 137. Of note, UDS and EtOH levels negative.  CMP with elevated creatinine  at 1.67. Appears his baseline is around 1. Ct  head negative for acute changes. Dose of lactulose given and patient admitted to hospitalist service.    Final Clinical Impressions(s) / ED Diagnoses   Final diagnoses:  Hepatic encephalopathy [facility])    New Prescriptions New Prescriptions   No medications on file     [facility] Errin Whitel[place], PA-C 08/14/16 [place], MD 08/14/16 [phone]

## 2016-08-14 NOTE — H&P (Signed)
History and Physical    Jose [name]:[id number] DOB: 02-14-1956 DOA: 08/14/2016   PCP: [name], MD   Patient coming from/Resides with: Local drug/alcohol rehabilitation center  Admission status: Inpatient/floor -medically necessary to stay a minimum 2 midnights to rule out impending and/or unexpected changes in physiologic status that may differ from initial evaluation performed in the ER and/or at time of admission. Patient presents with hepatic encephalopathy and acute kidney injury likely secondary to common at use of moderate dose diuretics in setting of lactulose induce diarrheal stools. Patient will require general IV fluid hydration for 24 hours, frequent neurological checks until mental status at baseline. He will continue his usual doses of lactulose and Xifaxan and he will likely require adjustments in his diuretics prior to discharge. He will also require serial electrolytes until renal function back to baseline.  Chief Complaint: Altered mental status  HPI: Jose [name] is a 61 y.o. 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 Patient recently discharged from this facility on 4/11 after a similar admission. Prior to that admission he was utilizing Aldactone 200 mg daily and at time of discharge that dosage had decreased to 50 mg daily. On date of discharge his ammonia level had decreased to 120 from a peak of 139. LFTs were baseline. Other labs were stable and he was documented as being alert and oriented and appropriate. He was discharged to a rehabilitation facility. Patient was sent back from facility due to progressive lethargy. Apparently he appeared to be volume depleted and were had low normal blood pressure so he was given 500 mL of normal saline by EMS. Upon arrival here the patient's sodium is  131 which was near baseline, BUN was elevated at 27 and creatinine elevated to 1.67 with an ammonia level of 137 and a total bilirubin mildly elevated at 1.8 with slight increase in AST and ALT. CT of the head demonstrated no acute abnormalities.  ED Course:  Vital Signs: BP 100/73   Pulse (!) 55   Temp 98.2 F (36.8 C) (Rectal)   Resp 14   Ht '5\' 6"'$  (1.676 m)   Wt 81.6 kg (180 lb)   SpO2 99%   BMI 29.05 kg/m  CT head without contrast: No acute abnormalities Lab data: Sodium 131, chloride 25, potassium 4.8, CO2 18, glucose 109, BUN 27, creatinine 1.67, anion gap 8, ammonia 137, alkaline phosphatase 191, albumin 2.6, lipase 57, AST 96, ALT 64, total bilirubin 1.8, white count 3400 and normal differential, hemoglobin 14.7, platelets 59,000, urinalysis unremarkable, alcohol level less than 5, UDS negative Medications and treatments: Lactulose 20 g 1  Review of Systems:  In addition to the HPI above,  No Fever-chills, myalgias or other constitutional symptoms No Headache, changes with Vision or hearing, new weakness, tingling, numbness in any extremity, dizziness, dysarthria or word finding difficulty, gait disturbance or imbalance, tremors or seizure activity No problems swallowing food or Liquids, indigestion/reflux, choking or coughing while eating, abdominal pain with or after eating No Chest pain, Cough or Shortness of Breath, palpitations, orthopnea or DOE No Abdominal pain, N/V, melena,hematochezia, dark tarry stools, constipation No dysuria, malodorous urine, hematuria or flank pain No new skin rashes, lesions, masses or bruises, No new joint pains, aches, swelling or redness No recent unintentional weight gain or loss No polyuria, polydypsia or polyphagia   Past Medical History:  Diagnosis Date  .  Alcohol abuse   . Alcohol dependence ([place])   . Alcoholism ([place]) 07/12/2015  . Ascites   . Cirrhosis ([place])   . Depression   . Hepatitis C   . Hypertension   . QT prolongation     . Shortness of breath    with exertion  . Thrombocytopenia ([place])     Past Surgical History:  Procedure Laterality Date  . CIRCUMCISION    . COLONOSCOPY  march 2015   Dr. Renee Harder: nodular mucosa at appendiceal orifice, tubular adenoma, extremely poor prep  . COLONOSCOPY      Social History   Social History  . Marital status: Single    Spouse name: N/A  . Number of children: N/A  . Years of education: N/A   Occupational History  . Not on file.   Social History Main Topics  . Smoking status: Current Some Day Smoker  . Smokeless tobacco: Never Used  . Alcohol use Yes     Comment: A couple beers every few days   . Drug use: Yes    Types: Marijuana, Cocaine     Comment: last used 11/03/13  . 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 Not on file   Social History Narrative   ** Merged History Encounter **        Mobility: Independent Work history: Disabled   No Known Allergies  Family History  Problem Relation Age of Onset  . Breast cancer Other   . Diabetes Other   . Hypertension Other   . Breast cancer Mother   . Hypertension Mother   . Diabetes Father   . Hypertension Sister   . Heart disease Sister   . Hypertension Paternal Grandmother   . Colon cancer Neg Hx     Prior to Admission medications   Medication Sig Start Date End Date Taking? Authorizing Provider  albuterol (PROVENTIL HFA;VENTOLIN HFA) 108 (90 Base) MCG/ACT inhaler Inhale 2 puffs into the lungs every 6 (six) hours as needed for wheezing or shortness of breath. 07/18/16   Eugenie Filler, MD  fluticasone (FLONASE) 50 MCG/ACT nasal spray Place 2 sprays into both nostrils daily.    Historical Provider, MD  folic acid (FOLVITE) 1 MG tablet Take 1 tablet (1 mg total) by mouth daily. 06/09/16   Shanker Kristeen Mans, MD  furosemide (LASIX) 40 MG tablet Take 1 tablet (40 mg total) by mouth 2 (two) times daily. 08/06/16   Reyne Dumas, MD   lactulose (CHRONULAC) 10 GM/15ML solution Take 45 mLs (30 g total) by mouth 4 (four) times daily. 08/06/16   Reyne Dumas, MD  Multiple Vitamin (MULTIVITAMIN) tablet Take 1 tablet by mouth daily.    Historical Provider, MD  nicotine (NICODERM CQ - DOSED IN MG/24 HOURS) 21 mg/24hr patch Place 1 patch (21 mg total) onto the skin daily. 07/18/16   Eugenie Filler, MD  omeprazole (PRILOSEC) 20 MG capsule Take 2 capsules (40 mg total) by mouth 2 (two) times daily before a meal. 07/18/16   Eugenie Filler, MD  ondansetron (ZOFRAN) 8 MG tablet Take 8 mg by mouth every 8 (eight) hours as needed for nausea or vomiting.    Historical Provider, MD  potassium chloride SA (K-DUR,KLOR-CON) 20 MEQ tablet Take 2 tablets (40 mEq total) by mouth daily. 08/06/16   Reyne Dumas, MD  rifaximin (XIFAXAN) 550 MG TABS tablet Take 1 tablet (550 mg total) by mouth 2 (  two) times daily. 08/06/16   Reyne Dumas, MD  spironolactone (ALDACTONE) 50 MG tablet Take 1 tablet (50 mg total) by mouth daily. 08/06/16   Reyne Dumas, MD  traZODone (DESYREL) 100 MG tablet Take 100 mg by mouth at bedtime as needed for sleep.    Historical Provider, MD    Physical Exam: Vitals:   08/14/16 1230 08/14/16 1245 08/14/16 1300 08/14/16 1315  BP: 101/69 102/69 100/70 100/73  Pulse: (!) 52 (!) 53 (!) 49 (!) 55  Resp: '14 14 14 14  '$ Temp:      TempSrc:      SpO2: 98% 98% 91% 99%  Weight:      Height:          Constitutional: NAD, calm, comfortable-Lethargic but awakens to voice Eyes: PERRL, lids and conjunctivae normal ENMT: Mucous membranes are dry. Posterior pharynx clear of any exudate or lesions.Normal dentition.  Neck: normal, supple, no masses, no thyromegaly Respiratory: clear to auscultation bilaterally, no wheezing, no crackles. Normal respiratory effort. No accessory muscle use.  Cardiovascular: Regular bradycardic rate and rhythm, no murmurs / rubs / gallops. Trace to 1+ bilateral lower extremity edema. 2+ pedal pulses. No  carotid bruits.  Abdomen: no tenderness, no masses palpated. No hepatosplenomegaly. Bowel sounds positive.  Musculoskeletal: no clubbing / cyanosis. No joint deformity upper and lower extremities. Good ROM, no contractures. Normal muscle tone.  Skin: no rashes, lesions, ulcers. No induration-lower extremity skin changes consistent with brawny chronic edema Neurologic: CN 2-12 grossly intact. Sensation intact, DTR normal. Strength 5/5 x all 4 extremities.  Psychiatric: Awakens to voice and oriented x name and place. Lethargic    Labs on Admission: I have personally reviewed following labs and imaging studies  CBC:  Recent Labs Lab 08/14/16 1213  WBC 3.4*  NEUTROABS 1.8  HGB 14.2  HCT 39.7  MCV 97.5  PLT 59*   Basic Metabolic Panel:  Recent Labs Lab 08/14/16 1213  NA 131*  K 4.8  CL 105  CO2 18*  GLUCOSE 109*  BUN 27*  CREATININE 1.67*  CALCIUM 8.5*   GFR: Estimated Creatinine Clearance: 47.2 mL/min (A) (by C-G formula based on SCr of 1.67 mg/dL (H)). Liver Function Tests:  Recent Labs Lab 08/14/16 1213  AST 96*  ALT 64*  ALKPHOS 191*  BILITOT 1.8*  PROT 7.5  ALBUMIN 2.6*    Recent Labs Lab 08/14/16 1213  LIPASE 57*    Recent Labs Lab 08/14/16 1213  AMMONIA 137*   Coagulation Profile: No results for input(s): INR, PROTIME in the last 168 hours. Cardiac Enzymes: No results for input(s): CKTOTAL, CKMB, CKMBINDEX, TROPONINI in the last 168 hours. BNP (last 3 results) No results for input(s): PROBNP in the last 8760 hours. HbA1C: No results for input(s): HGBA1C in the last 72 hours. CBG: No results for input(s): GLUCAP in the last 168 hours. Lipid Profile: No results for input(s): CHOL, HDL, LDLCALC, TRIG, CHOLHDL, LDLDIRECT in the last 72 hours. Thyroid Function Tests: No results for input(s): TSH, T4TOTAL, FREET4, T3FREE, THYROIDAB in the last 72 hours. Anemia Panel: No results for input(s): VITAMINB12, FOLATE, FERRITIN, TIBC, IRON, RETICCTPCT  in the last 72 hours. Urine analysis:    Component Value Date/Time   COLORURINE STRAW (A) 08/14/2016 [place] 08/14/2016 1204   LABSPEC 1.006 08/14/2016 1204   PHURINE 6.0 08/14/2016 1204   GLUCOSEU NEGATIVE 08/14/2016 1204   HGBUR SMALL (A) 08/14/2016 [place] 08/14/2016 [place] 08/14/2016 1204  PROTEINUR NEGATIVE 08/14/2016 1204   UROBILINOGEN 4.0 (H) 04/26/2013 0500   NITRITE NEGATIVE 08/14/2016 1204   LEUKOCYTESUR NEGATIVE 08/14/2016 1204   Sepsis Labs: '@LABRCNTIP'$ (procalcitonin:4,lacticidven:4) )No results found for this or any previous visit (from the past 240 hour(s)).   Radiological Exams on Admission: Ct Head Wo Contrast  Result Date: 08/14/2016 CLINICAL DATA:  Dizziness since earlier today.  Lethargy. EXAM: CT HEAD WITHOUT CONTRAST TECHNIQUE: Contiguous axial images were obtained from the base of the skull through the vertex without intravenous contrast. COMPARISON:  07/15/2016. FINDINGS: Brain: Diffusely enlarged ventricles and subarachnoid spaces. Patchy white matter low density in both cerebral hemispheres. No intracranial hemorrhage, mass lesion or CT evidence of acute infarction. Vascular: No hyperdense vessel or unexpected calcification. Skull: Normal. Negative for fracture or focal lesion. Sinuses/Orbits: Unremarkable. Other: None. IMPRESSION: 1. No acute abnormality. 2. Stable mild diffuse cerebral and cerebellar atrophy and mild chronic small vessel white matter ischemic changes in both cerebral hemispheres. Electronically Signed   By: Claudie Revering M.D.   On: 08/14/2016 12:37    EKG: (Independently reviewed) sinus bradycardia with ventricular rate 51 bpm, QTC 455 ms, voltage criteria met for LVH, can J-point elevation, no acute ischemic changes  Assessment/Plan Principal Problem:   Hepatic encephalopathy  -Patient presents with altered mental status in setting of slightly higher than usual ammonia level in  combination with acute kidney injury -Continue preadmission lactulose and Xifaxan -Gentle IV fluid hydration to aid in correction of hyperammonemia -Neuro checks every 4 hours -Initiate diet was slowly advanced to a heart healthy once more alert -Ammonia level in a.m.  Active Problems:   Cirrhosis of liver with ascites/chronic hepatitis C  -LFTs mildly elevated in setting of dehydration with no evidence of acute decompensation/acute liver failure -No active ascites and no abdominal pain on exam    Acute kidney injury  -Baseline renal function: 16 and 1.09 -Current renal function: 27 and 1.67 -Hold diuretics for now -Recommend once diuretics resumed decrease Aldactone from 50 mg to 25 mg daily and continue Lasix 40 mg twice daily -Labs in a.m.    Thrombocytopenia  -Secondary to cirrhosis and at baseline    Essential hypertension -Currently normotensive with underlying bradycardic rate -Not on chronotropic medications prior to admission    Macrocytic anemia -Baseline hemoglobin just above 11 -Hemoglobin greater than 14 and indicative of hemoconcentration from dehydration    Protein-calorie malnutrition, severe  -Weight has been variable over the past 12 months and likely reflective of volume changes shifts secondary to diuresis -Peak weight in March was 215 pounds and current weight is 180 pounds -If demonstrates poor oral intake after diet advance consider nutritional consultation -Has chronic hypoalbuminemia in setting of cirrhosis    Chronic hyponatremia -Stable and near baseline of between 128 and 130      DVT prophylaxis: SCDs  Code Status: Full Family Communication: No family at bedside  Disposition Plan: Return to rehabilitation facility when medically stable Consults called: None    ELLIS,ALLISON L. ANP-BC [facility] Pager [phone]   If 7PM-7AM, please contact night-coverage [email or web address] Password [email or web address]  08/14/2016, 1:53 PM

## 2016-08-15 DIAGNOSIS — E871 Hypo-osmolality and hyponatremia: Secondary | ICD-10-CM

## 2016-08-15 DIAGNOSIS — K7290 Hepatic failure, unspecified without coma: Principal | ICD-10-CM

## 2016-08-15 DIAGNOSIS — N179 Acute kidney failure, unspecified: Secondary | ICD-10-CM

## 2016-08-15 DIAGNOSIS — K7460 Unspecified cirrhosis of liver: Secondary | ICD-10-CM

## 2016-08-15 DIAGNOSIS — B182 Chronic viral hepatitis C: Secondary | ICD-10-CM

## 2016-08-15 DIAGNOSIS — R160 Hepatomegaly, not elsewhere classified: Secondary | ICD-10-CM

## 2016-08-15 DIAGNOSIS — K7031 Alcoholic cirrhosis of liver with ascites: Secondary | ICD-10-CM

## 2016-08-15 DIAGNOSIS — K4090 Unilateral inguinal hernia, without obstruction or gangrene, not specified as recurrent: Secondary | ICD-10-CM

## 2016-08-15 LAB — BASIC METABOLIC PANEL
Anion gap: 10 (ref 5–15)
BUN: 24 mg/dL — AB (ref 6–20)
CO2: 17 mmol/L — ABNORMAL LOW (ref 22–32)
Calcium: 8.8 mg/dL — ABNORMAL LOW (ref 8.9–10.3)
Chloride: 102 mmol/L (ref 101–111)
Creatinine, Ser: 1.32 mg/dL — ABNORMAL HIGH (ref 0.61–1.24)
GFR calc Af Amer: >60 mL/min (ref >60)
GFR, EST NON AFRICAN AMERICAN: 57 mL/min — AB (ref >60)
Glucose, Bld: 112 mg/dL — ABNORMAL HIGH (ref 65–99)
POTASSIUM: 4.7 mmol/L (ref 3.5–5.1)
SODIUM: 129 mmol/L — AB (ref 135–145)

## 2016-08-15 LAB — CBC
HEMATOCRIT: 38.6 % — AB (ref 39.0–52.0)
Hemoglobin: 13.5 g/dL (ref 13.0–17.0)
MCH: 34.1 pg — ABNORMAL HIGH (ref 26.0–34.0)
MCHC: 35 g/dL (ref 30.0–36.0)
MCV: 97.5 fL (ref 78.0–100.0)
PLATELETS: 62 10*3/uL — AB (ref 150–400)
RBC: 3.96 MIL/uL — ABNORMAL LOW (ref 4.22–5.81)
RDW: 13.7 % (ref 11.5–15.5)
WBC: 3.8 10*3/uL — AB (ref 4.0–10.5)

## 2016-08-15 LAB — AMMONIA: Ammonia: 156 umol/L — ABNORMAL HIGH (ref 9–35)

## 2016-08-15 MED FILL — GENERLAC 10 GM/15 ML SOLN: 10 | 30 days supply | Qty: 4050 | Fill #0

## 2016-08-15 NOTE — Progress Notes (Signed)
Mr. Councilman sister is at bedside and requested to speak with MD for update.  Dr. Broadus John informed and called to give update via phone.

## 2016-08-15 NOTE — Consult Note (Signed)
Referring Provider: [facility]  Primary Care Physician:  [name], MD Primary Gastroenterologist:   unassigned Reason for Consultation:  Recurrent encephalopathy  ASSESSMENT AND PLAN:   53. 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 Several admissions for hepatic encephalopathy, just discharged for same on 4/11.  Brought back to ED yesterday from rehab for lethargy.  No ETOH in 2 months. UDS negative.  -no acute findings on head CTscan. U/A negative. CXR negative. Sodium not far from baseline. Ammonia 156 but doesn't always correlate with degree of encephalopathy. He does have mild asterixis on exam but is lucid, oriented. I don't have a baseline on him but he doesn't seem too encephalopathic at the moment. Patient administers own meds, says he doesn't miss any doses. Certainly his lethargy could be from encephalopathy, especially if he has any alterations in sleep habits.  .-Liver mass on MRI. Mass is 3.4 x 3.4 cm mildly increase since last imaging. Biopsied last May but results inconclusive. Atypical cells seen, no definite HCC. His  AFP is only 23, it was 34 in May -some of his weakness may have been from intravascular volume depletion. He has been taking aldactone 200 mg daily and lasix 40 mg daily. He presented in AKI which is improving.  -As far as overall picture, he could certainly benefit from a Palliative Care consult. Additionally, Social Work consultl or Case Management. Patient self-administers home medications. It would be helpful if someone at Rehab could oversee his medications to ensure compliance.   2. Large hernia (?inguinal).nguinal hernia. Protrusion above base of penis. He has no pain there but is complaining of left leg pain. Patient believes he will have surgical repair soon.  Patient at high risk for decompensation with any surgery. .   3. AKI, improving  HPI: Jose [name] is a 61 y.o. male hx of ETOH cirrhosis. He presumably has HCV with + antibody. He has had several admissions for decompensated cirrhosis / hepatic encephalopathy. He was discharged from Hospital on 08/06/16 after being treated for hepatic encephalopathy and AKI. Patient tells me he was in class at rehab yesterday when he became very weak. No abdominal pain or other pain except in his legs. He has chronic leg pain and left bothering him more than the right lately. Patient had no chest pain, SOB or visual disturbances.    Past Medical History:  Diagnosis Date  . Alcohol abuse   . Alcohol dependence ([place])   . Alcoholism ([place]) 07/12/2015  . Ascites   . Cirrhosis ([place])   . Depression   . Hepatitis C   . Hypertension   . QT prolongation   . Shortness of breath    with exertion  . Thrombocytopenia ([place])     Past Surgical History:  Procedure Laterality Date  . CIRCUMCISION    . COLONOSCOPY  march 2015   Dr. Renee Harder: nodular mucosa at appendiceal orifice, tubular adenoma, extremely poor prep  . COLONOSCOPY      Prior to Admission medications   Medication Sig Start Date End Date Taking? Authorizing Provider  albuterol (PROVENTIL HFA;VENTOLIN HFA) 108 (90 Base) MCG/ACT inhaler Inhale 2 puffs into the lungs every 6 (six) hours as needed for wheezing or shortness of breath. 07/18/16  Yes Eugenie Filler, MD  fluticasone (FLONASE) 50 MCG/ACT nasal spray Place 2 sprays into both nostrils daily.   Yes Historical  Provider, MD  folic acid (FOLVITE) 1 MG tablet Take 1 tablet (1 mg total) by mouth daily. 06/09/16  Yes Shanker Kristeen Mans, MD  furosemide (LASIX) 40 MG tablet Take 1 tablet (40 mg total) by mouth 2 (two) times daily. Patient taking differently: Take 40 mg by mouth daily.  08/06/16  Yes Reyne Dumas, MD  lactulose (CHRONULAC) 10 GM/15ML solution Take 45 mLs (30 g total) by mouth 4 (four)  times daily. Patient taking differently: Take 30 g by mouth 3 (three) times daily.  08/06/16  Yes Reyne Dumas, MD  Multiple Vitamin (MULTIVITAMIN) tablet Take 1 tablet by mouth daily.   Yes Historical Provider, MD  nicotine (NICODERM CQ - DOSED IN MG/24 HOURS) 21 mg/24hr patch Place 1 patch (21 mg total) onto the skin daily. 07/18/16  Yes Eugenie Filler, MD  omeprazole (PRILOSEC) 20 MG capsule Take 2 capsules (40 mg total) by mouth 2 (two) times daily before a meal. 07/18/16  Yes Eugenie Filler, MD  ondansetron (ZOFRAN) 8 MG tablet Take 8 mg by mouth every 8 (eight) hours as needed for nausea or vomiting.   Yes Historical Provider, MD  potassium chloride SA (K-DUR,KLOR-CON) 20 MEQ tablet Take 2 tablets (40 mEq total) by mouth daily. Patient taking differently: Take 20 mEq by mouth 2 (two) times daily.  08/06/16  Yes Reyne Dumas, MD  rifaximin (XIFAXAN) 550 MG TABS tablet Take 1 tablet (550 mg total) by mouth 2 (two) times daily. 08/06/16  Yes Reyne Dumas, MD  spironolactone (ALDACTONE) 50 MG tablet Take 1 tablet (50 mg total) by mouth daily. Patient taking differently: Take 200 mg by mouth daily.  08/06/16  Yes Reyne Dumas, MD  traZODone (DESYREL) 100 MG tablet Take 100 mg by mouth at bedtime as needed for sleep.   Yes Historical Provider, MD    Current Facility-Administered Medications  Medication Dose Route Frequency Provider Last Rate Last Dose  . acetaminophen (TYLENOL) tablet 650 mg  650 mg Oral Q6H PRN Samella Parr, NP   650 mg at 08/14/16 2047   Or  . acetaminophen (TYLENOL) suppository 650 mg  650 mg Rectal Q6H PRN Samella Parr, NP      . albuterol (PROVENTIL) (2.5 MG/3ML) 0.083% nebulizer solution 3 mL  3 mL Inhalation Q6H PRN Samella Parr, NP      . fluticasone (FLONASE) 50 MCG/ACT nasal spray 2 spray  2 spray Each Nare Daily Samella Parr, NP      . folic acid (FOLVITE) tablet 1 mg  1 mg Oral Daily Samella Parr, NP   1 mg at 08/15/16 1041  . lactulose (CHRONULAC) 10  GM/15ML solution 30 g  30 g Oral QID Samella Parr, NP   30 g at 08/15/16 1355  . multivitamin with minerals tablet 1 tablet  1 tablet Oral Daily Samella Parr, NP   1 tablet at 08/15/16 1041  . nicotine (NICODERM CQ - dosed in mg/24 hours) patch 21 mg  21 mg Transdermal Daily Samella Parr, NP   21 mg at 08/15/16 1041  . ondansetron (ZOFRAN) tablet 4 mg  4 mg Oral Q6H PRN Samella Parr, NP       Or  . ondansetron [facility]) injection 4 mg  4 mg Intravenous Q6H PRN Samella Parr, NP      . pantoprazole (PROTONIX) EC tablet 40 mg  40 mg Oral Daily Samella Parr, NP   40 mg at 08/15/16 1041  .  rifaximin (XIFAXAN) tablet 550 mg  550 mg Oral BID Samella Parr, NP   550 mg at 08/15/16 1041    Allergies as of 08/14/2016  . (No Known Allergies)    Family History  Problem Relation Age of Onset  . Breast cancer Other   . Diabetes Other   . Hypertension Other   . Breast cancer Mother   . Hypertension Mother   . Diabetes Father   . Hypertension Sister   . Heart disease Sister   . Hypertension Paternal Grandmother   . Colon cancer Neg Hx     Social History   Social History  . Marital status: Single    Spouse name: N/A  . Number of children: N/A  . Years of education: N/A   Occupational History  . Not on file.   Social History Main Topics  . Smoking status: Current Some Day Smoker  . Smokeless tobacco: Never Used  . Alcohol use Yes     Comment: A couple beers every few days   . Drug use: Yes    Types: Marijuana, Cocaine     Comment: last used 11/03/13  . 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 Not on file   Social History Narrative   ** Merged History Encounter **        Review of Systems: All systems reviewed and negative except where noted in HPI.  Physical Exam: Vital signs in last 24 hours: Temp:  [97.8 F (36.6 C)] 97.8 F (36.6 C) (04/20 0537) Pulse Rate:  [53-60] 60 (04/20 0537) Resp:   [18] 18 (04/20 0537) BP: (108-117)/(60-71) 117/60 (04/20 0537) SpO2:  [99 %-100 %] 99 % (04/20 0537) Weight:  [179 lb 14.3 oz (81.6 kg)] 179 lb 14.3 oz (81.6 kg) (04/20 0537) Last BM Date: 08/14/16 General:   Alert,  well-developed, black male inNAD Eyes:  Sclera clear, no icterus.   Conjunctiva pink. Ears:  Normal auditory acuity. Nose:  No deformity, discharge,  or lesions.   Neck:  Supple; no masses Lungs:  Clear throughout to auscultation.   No wheezes, crackles, or rhonchi.  Heart:  Regular rate and rhythm; + murmur, clicks, rubs,  or gallops. Abdomen:  Soft,nontender, BS active,nonpalp mass or hsm.   Rectal:  Deferred  Msk:  Symmetrical without gross deformities. . Extremities:  Without clubbing or edema. Neurologic:  Alert and  oriented x4;  grossly normal neurologically. Skin:  Intact without significant lesions or rashes.. Psych:  Alert and cooperative. Normal mood and affect.  Intake/Output from previous day: 04/19 0701 - 04/20 0700 In: 2427.5 [P.O.:1440; I.V.:682.5; IV Piggyback:105] Out: 600 [Urine:600] Intake/Output this shift: Total I/O In: 120 [P.O.:120] Out: 225 [Urine:225]  Lab Results:  Recent Labs  08/14/16 1213 08/15/16 0447  WBC 3.4* 3.8*  HGB 14.2 13.5  HCT 39.7 38.6*  PLT 59* 62*   BMET  Recent Labs  08/14/16 1213 08/15/16 0447  NA 131* 129*  K 4.8 4.7  CL 105 102  CO2 18* 17*  GLUCOSE 109* 112*  BUN 27* 24*  CREATININE 1.67* 1.32*  CALCIUM 8.5* 8.8*   LFT  Recent Labs  08/14/16 1213  PROT 7.5  ALBUMIN 2.6*  AST 96*  ALT 64*  ALKPHOS 191*  BILITOT 1.8*    Studies/Results: Ct Head Wo Contrast  Result Date: 08/14/2016 CLINICAL DATA:  Dizziness since earlier today.  Lethargy. EXAM: CT HEAD WITHOUT CONTRAST TECHNIQUE: Contiguous  axial images were obtained from the base of the skull through the vertex without intravenous contrast. COMPARISON:  07/15/2016. FINDINGS: Brain: Diffusely enlarged ventricles and subarachnoid spaces.  Patchy white matter low density in both cerebral hemispheres. No intracranial hemorrhage, mass lesion or CT evidence of acute infarction. Vascular: No hyperdense vessel or unexpected calcification. Skull: Normal. Negative for fracture or focal lesion. Sinuses/Orbits: Unremarkable. Other: None. IMPRESSION: 1. No acute abnormality. 2. Stable mild diffuse cerebral and cerebellar atrophy and mild chronic small vessel white matter ischemic changes in both cerebral hemispheres. Electronically Signed   By: Claudie Revering M.D.   On: 08/14/2016 12:37    Tye Savoy, NP-C @  08/15/2016, 2:27 PM  Pager number [phone]

## 2016-08-15 NOTE — Progress Notes (Signed)
Patient comes from [facility] [phone]) and will return at discharge.   Percell Locus Ellisa Devivo LCSWA [phone]

## 2016-08-15 NOTE — Progress Notes (Signed)
PROGRESS NOTE    Jose [name]  EUM:[id number] DOB: 1955/11/01 DOA: 08/14/2016 PCP: [name], MD  Brief Narrative: Jose [name] is a 61 y.o. male with medical history significant of Hep C/Etoh/Cirrhosis, Hemochromatosis, chronic thrombocytopenia, recurrent hospitalizations with hepatic encephalopathy, noncompliance currently residing at a group home presented to the emergency room due to lethargy/hepatic encephalopathy. He was also noted to have a 3.5 cm liver lesion on MRI abdomen last admission, slightly larger than 08/2015, had a liver biopsy in 2017 which showed atypical liver cells but was not diagnostic for [facility].    Assessment & Plan:   Principal Problem:   Hepatic encephalopathy ([place]) -pt reports compliance with lactulose/rifaximin, he is responsible for taking his meds at the group home so cant be certain of compliance -frequent admissions for same -in addition, strong suspicion he has [place], with liver lesion noted last admission with AFP of 23 -will ask GI for input  Liver mass 3.4cm -larger than in 2017, suspicious for 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 -reports abstinence from ETOH for few weeks now -residing at a Rehab center currently -resume Lasix aldactone in 1-2days  Acute kidney injury  -likely due to poor PO intake/diuretics -diuretics on hold now, resume in 1-2days    Thrombocytopenia/leukopenia -Secondary to cirrhosis and at baseline    Essential hypertension -Currently normotensive with underlying bradycardic rate -Not on chronotropic medications prior to admission    Protein-calorie malnutrition, severe  -Weight has been variable over the past 12 months and likely reflective of volume changes shifts secondary to diuresis -Peak weight in March was 215 pounds and current weight is 180 pounds -RD consult    Chronic hyponatremia -Stable and near baseline of  between 128 and 130   DVT prophylaxis: SCDs due to low plts Code Status: Full Family Communication: No family at bedside  Disposition Plan: to be determined  Consultants:   GI     Subjective: Feels weak, ok otherwise, reports med complaince  Objective: Vitals:   08/14/16 1315 08/14/16 1330 08/14/16 1540 08/15/16 0537  BP: 100/73 101/69 108/71 117/60  Pulse: (!) 55 (!) 56 (!) 53 60  Resp: 14 14 18 18   Temp:    97.8 F (36.6 C)  TempSrc:    Oral  SpO2: 99% 99% 100% 99%  Weight:    81.6 kg (179 lb 14.3 oz)  Height:        Intake/Output Summary (Last 24 hours) at 08/15/16 1129 Last data filed at 08/15/16 1045  Gross per 24 hour  Intake          2547.51 ml  Output              825 ml  Net          1722.51 ml   Filed Weights   08/14/16 1144 08/15/16 0537  Weight: 81.6 kg (180 lb) 81.6 kg (179 lb 14.3 oz)    Examination:  General exam: Chronically ill male laying in bed, in no distress, awake but somewhat somnolent  Respiratory system: Decreased breath sounds at the bases Cardiovascular system: S1 & S2 heard, RRR.  Gastrointestinal system: Abdomen is soft, mildly distended, positive fluid thrill  Central nervous system: Awake but partially somnolent moves all extremities, no localizing signs, positive asterixis Extremities: Symmetric 5 x 5 power. Skin: No rashes, lesions or ulcers Psychiatry: Flat affect and    Data Reviewed:   CBC:  Recent Labs Lab 08/14/16 1213 08/15/16 0447  WBC 3.4* 3.8*  NEUTROABS 1.8  --   HGB 14.2 13.5  HCT 39.7 38.6*  MCV 97.5 97.5  PLT 59* 62*   Basic Metabolic Panel:  Recent Labs Lab 08/14/16 1213 08/14/16 1616 08/15/16 0447  NA 131*  --  129*  K 4.8  --  4.7  CL 105  --  102  CO2 18*  --  17*  GLUCOSE 109*  --  112*  BUN 27*  --  24*  CREATININE 1.67*  --  1.32*  CALCIUM 8.5*  --  8.8*  MG  --  2.2  --    GFR: Estimated Creatinine Clearance: 59.7 mL/min (A) (by C-G formula based on SCr of 1.32 mg/dL  (H)). Liver Function Tests:  Recent Labs Lab 08/14/16 1213  AST 96*  ALT 64*  ALKPHOS 191*  BILITOT 1.8*  PROT 7.5  ALBUMIN 2.6*    Recent Labs Lab 08/14/16 1213  LIPASE 57*    Recent Labs Lab 08/14/16 1213 08/15/16 0447  AMMONIA 137* 156*   Coagulation Profile: No results for input(s): INR, PROTIME in the last 168 hours. Cardiac Enzymes: No results for input(s): CKTOTAL, CKMB, CKMBINDEX, TROPONINI in the last 168 hours. BNP (last 3 results) No results for input(s): PROBNP in the last 8760 hours. HbA1C: No results for input(s): HGBA1C in the last 72 hours. CBG: No results for input(s): GLUCAP in the last 168 hours. Lipid Profile: No results for input(s): CHOL, HDL, LDLCALC, TRIG, CHOLHDL, LDLDIRECT in the last 72 hours. Thyroid Function Tests: No results for input(s): TSH, T4TOTAL, FREET4, T3FREE, THYROIDAB in the last 72 hours. Anemia Panel: No results for input(s): VITAMINB12, FOLATE, FERRITIN, TIBC, IRON, RETICCTPCT in the last 72 hours. Urine analysis:    Component Value Date/Time   COLORURINE STRAW (A) 08/14/2016 1204   APPEARANCEUR CLEAR 08/14/2016 1204   LABSPEC 1.006 08/14/2016 1204   PHURINE 6.0 08/14/2016 1204   GLUCOSEU NEGATIVE 08/14/2016 1204   HGBUR SMALL (A) 08/14/2016 1204   BILIRUBINUR NEGATIVE 08/14/2016 1204   KETONESUR NEGATIVE 08/14/2016 1204   PROTEINUR NEGATIVE 08/14/2016 1204   UROBILINOGEN 4.0 (H) 04/26/2013 0500   NITRITE NEGATIVE 08/14/2016 1204   LEUKOCYTESUR NEGATIVE 08/14/2016 1204   Sepsis Labs: @LABRCNTIP (procalcitonin:4,lacticidven:4)  )No results found for this or any previous visit (from the past 240 hour(s)).       Radiology Studies: Ct Head Wo Contrast  Result Date: 08/14/2016 CLINICAL DATA:  Dizziness since earlier today.  Lethargy. EXAM: CT HEAD WITHOUT CONTRAST TECHNIQUE: Contiguous axial images were obtained from the base of the skull through the vertex without intravenous contrast. COMPARISON:  07/15/2016.  FINDINGS: Brain: Diffusely enlarged ventricles and subarachnoid spaces. Patchy white matter low density in both cerebral hemispheres. No intracranial hemorrhage, mass lesion or CT evidence of acute infarction. Vascular: No hyperdense vessel or unexpected calcification. Skull: Normal. Negative for fracture or focal lesion. Sinuses/Orbits: Unremarkable. Other: None. IMPRESSION: 1. No acute abnormality. 2. Stable mild diffuse cerebral and cerebellar atrophy and mild chronic small vessel white matter ischemic changes in both cerebral hemispheres. Electronically Signed   By: Claudie Revering M.D.   On: 08/14/2016 12:37        Scheduled Meds: . fluticasone  2 spray Each Nare Daily  . folic acid  1 mg Oral Daily  . lactulose  30 g Oral QID  . multivitamin with minerals  1 tablet Oral Daily  . nicotine  21 mg Transdermal Daily  . pantoprazole  40 mg Oral Daily  . rifaximin  550 mg Oral BID   Continuous Infusions:   LOS: 1 day    Time spent: 37min    Domenic Polite, MD [facility] Pager [phone]  If 7PM-7AM, please contact night-coverage [email or web address] Password [facility] 08/15/2016, 11:29 AM

## 2016-08-16 LAB — AFP TUMOR MARKER: AFP-Tumor Marker: 27.1 ng/mL — ABNORMAL HIGH (ref 0.0–8.3)

## 2016-08-16 MED ORDER — LACTULOSE 10 GM/15ML PO SOLN
30.0000 g | Freq: Four times a day (QID) | ORAL | Status: DC
Start: 2016-08-17 — End: 2016-08-18
  Administered 2016-08-17 – 2016-08-18 (×7): 30 g via ORAL
  Filled 2016-08-16 (×7): qty 45

## 2016-08-16 NOTE — Progress Notes (Signed)
PROGRESS NOTE    Jose [name]  QIW:[id number] DOB: [birth year] DOA: 08/14/2016 PCP: [name], MD  Brief Narrative: Jose [name] is a 61 y.o. male with medical history significant of Hep C/Etoh/Cirrhosis, Hemochromatosis, chronic thrombocytopenia, recurrent hospitalizations with hepatic encephalopathy, noncompliance currently residing at a group home presented to the emergency room due to lethargy/hepatic encephalopathy. He was also noted to have a 3.5 cm liver lesion on MRI abdomen last admission, slightly larger than 08/2015, had a liver biopsy in 2017 which showed atypical liver cells but was not diagnostic for [facility].    Assessment & Plan:    Hepatic encephalopathy ([place]) -pt reports compliance with lactulose/rifaximin, he is responsible for taking his meds at the group home so cant be certain of compliance -frequent admissions for same -in addition, strong suspicion he has [place], with liver lesion noted last admission with AFP of 23 -appreciate GI input, recommended Palliative eval and outpatient FU with GI -mentation better, although ammonia still high  Liver mass 3.4cm -larger than in 2017, ?  [place], biopsy 2017 with no malignancy.  Per GI could be a large regenerative nodule. 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 -reports abstinence from ETOH for few weeks now -residing at a Rehab center currently -plan to hold diuretics at this time  Acute kidney injury  -likely due to poor PO intake/diuretics -diuretics on hold now, resumed in 1-2days   R inguinal hernia -not a candidate for hernia repair   Thrombocytopenia/leukopenia -Secondary to cirrhosis and at baseline    Essential hypertension -Currently normotensive with underlying bradycardic rate -Not on chronotropic medications prior to admission    Protein-calorie malnutrition, severe  -Weight has been  variable over the past 12 months and likely reflective of volume changes shifts secondary to diuresis -Peak weight in March was 215 pounds and current weight is 180 pounds -RD consult    Chronic hyponatremia -Stable and near baseline of between 128 and 130   DVT prophylaxis: SCDs due to low plts Code Status: Full Family Communication: No family at bedside  Disposition Plan: to be determined  Consultants:   GI  Subjective: Feels ok, more alert  Objective: Vitals:   08/15/16 2142 08/16/16 0217 08/16/16 0512 08/16/16 1316  BP: 126/71  126/72 116/69  Pulse: 63  (!) 54 (!) 54  Resp: 19  19 18   Temp: 98.2 F (36.8 C)  98.4 F (36.9 C) 97.6 F (36.4 C)  TempSrc:      SpO2: 100%  100% 100%  Weight:  83.2 kg (183 lb 6.8 oz)    Height:        Intake/Output Summary (Last 24 hours) at 08/16/16 1415 Last data filed at 08/16/16 4174  Gross per 24 hour  Intake              120 ml  Output                0 ml  Net              120 ml   Filed Weights   08/14/16 1144 08/15/16 0537 08/16/16 0217  Weight: 81.6 kg (180 lb) 81.6 kg (179 lb 14.3 oz) 83.2 kg (183 lb 6.8 oz)    Examination:  General exam: Chronically ill male laying in bed, in no distress, awake but somewhat somnolent  Respiratory system: Decreased breath sounds at the bases Cardiovascular  system: S1 & S2 heard, RRR.  Gastrointestinal system: Abdomen is soft, mildly distended, positive fluid thrill  Central nervous system: Awake but partially somnolent moves all extremities, no localizing signs, positive asterixis Extremities: Symmetric 5 x 5 power. Skin: No rashes, lesions or ulcers Psychiatry: Flat affect and    Data Reviewed:   CBC:  Recent Labs Lab 08/14/16 1213 08/15/16 0447  WBC 3.4* 3.8*  NEUTROABS 1.8  --   HGB 14.2 13.5  HCT 39.7 38.6*  MCV 97.5 97.5  PLT 59* 62*   Basic Metabolic Panel:  Recent Labs Lab 08/14/16 1213 08/14/16 1616 08/15/16 0447  NA 131*  --  129*  K 4.8  --  4.7    CL 105  --  102  CO2 18*  --  17*  GLUCOSE 109*  --  112*  BUN 27*  --  24*  CREATININE 1.67*  --  1.32*  CALCIUM 8.5*  --  8.8*  MG  --  2.2  --    GFR: Estimated Creatinine Clearance: 60.3 mL/min (A) (by C-G formula based on SCr of 1.32 mg/dL (H)). Liver Function Tests:  Recent Labs Lab 08/14/16 1213  AST 96*  ALT 64*  ALKPHOS 191*  BILITOT 1.8*  PROT 7.5  ALBUMIN 2.6*    Recent Labs Lab 08/14/16 1213  LIPASE 57*    Recent Labs Lab 08/14/16 1213 08/15/16 0447  AMMONIA 137* 156*   Coagulation Profile: No results for input(s): INR, PROTIME in the last 168 hours. Cardiac Enzymes: No results for input(s): CKTOTAL, CKMB, CKMBINDEX, TROPONINI in the last 168 hours. BNP (last 3 results) No results for input(s): PROBNP in the last 8760 hours. HbA1C: No results for input(s): HGBA1C in the last 72 hours. CBG: No results for input(s): GLUCAP in the last 168 hours. Lipid Profile: No results for input(s): CHOL, HDL, LDLCALC, TRIG, CHOLHDL, LDLDIRECT in the last 72 hours. Thyroid Function Tests: No results for input(s): TSH, T4TOTAL, FREET4, T3FREE, THYROIDAB in the last 72 hours. Anemia Panel: No results for input(s): VITAMINB12, FOLATE, FERRITIN, TIBC, IRON, RETICCTPCT in the last 72 hours. Urine analysis:    Component Value Date/Time   COLORURINE STRAW (A) 08/14/2016 1204   APPEARANCEUR CLEAR 08/14/2016 1204   LABSPEC 1.006 08/14/2016 1204   PHURINE 6.0 08/14/2016 1204   GLUCOSEU NEGATIVE 08/14/2016 1204   HGBUR SMALL (A) 08/14/2016 1204   BILIRUBINUR NEGATIVE 08/14/2016 1204   KETONESUR NEGATIVE 08/14/2016 1204   PROTEINUR NEGATIVE 08/14/2016 1204   UROBILINOGEN 4.0 (H) 04/26/2013 0500   NITRITE NEGATIVE 08/14/2016 1204   LEUKOCYTESUR NEGATIVE 08/14/2016 1204   Sepsis Labs: @LABRCNTIP (procalcitonin:4,lacticidven:4)  )No results found for this or any previous visit (from the past 240 hour(s)).       Radiology Studies: No results  found.      Scheduled Meds: . fluticasone  2 spray Each Nare Daily  . folic acid  1 mg Oral Daily  . lactulose  30 g Oral QID  . multivitamin with minerals  1 tablet Oral Daily  . nicotine  21 mg Transdermal Daily  . pantoprazole  40 mg Oral Daily  . rifaximin  550 mg Oral BID   Continuous Infusions:   LOS: 2 days    Time spent: 67min    Domenic Polite, MD [facility] Pager [phone]  If 7PM-7AM, please contact night-coverage [email or web address] Password [email or web address] 08/16/2016, 2:15 PM

## 2016-08-16 NOTE — Evaluation (Signed)
Physical Therapy Evaluation Patient Details Name: Jose [name] MRN: [id number] DOB: 04-02-1956 Today's Date: 08/16/2016   History of Present Illness  pt is a 61 y/o male with pmh significant for cirhosis, varices, ETOH abuse, hep C, HTN admitted with hepatic encephalopathy and progressive lethargy.  Clinical Impression  Pt is at or close to baseline functioning and should be safe at home . There are no further acute PT needs.  Will sign off at this time.     Follow Up Recommendations No PT follow up    Equipment Recommendations       Recommendations for Other Services       Precautions / Restrictions Precautions Precautions:  (minimal risk)      Mobility  Bed Mobility Overal bed mobility: Modified Independent                Transfers Overall transfer level: Modified independent                  Ambulation/Gait Ambulation/Gait assistance: Independent Ambulation Distance (Feet): 300 Feet Assistive device: None     Gait velocity interpretation: at or above normal speed for age/gender General Gait Details: functional, age appropriate speeds, steady  Financial trader Rankin (Stroke Patients Only)       Balance     Sitting balance-Leahy Scale: Normal       Standing balance-Leahy Scale: Good                               Pertinent Vitals/Pain Pain Assessment: No/denies pain    Home Living Family/patient expects to be discharged to:: Group home Living Arrangements: Group Home   Type of Home: Apartment Home Access: Level entry     Home Layout: One level Home Equipment: Clinical cytogeneticist - 2 wheels;Cane - single point      Prior Function Level of Independence: Independent         Comments: pt report does not sit on shower chair     Hand Dominance   Dominant Hand: Right    Extremity/Trunk Assessment   Upper Extremity Assessment Upper Extremity Assessment: Overall WFL for  tasks assessed    Lower Extremity Assessment Lower Extremity Assessment: Overall WFL for tasks assessed    Cervical / Trunk Assessment Cervical / Trunk Assessment: Normal  Communication   Communication: No difficulties  Cognition Arousal/Alertness: Awake/alert Behavior During Therapy: WFL for tasks assessed/performed Overall Cognitive Status: Within Functional Limits for tasks assessed                                        General Comments      Exercises     Assessment/Plan    PT Assessment Patent does not need any further PT services  PT Problem List         PT Treatment Interventions      PT Goals (Current goals can be found in the Care Plan section)       Frequency     Barriers to discharge        Co-evaluation               End of Session   Activity Tolerance: Patient tolerated treatment well Patient left: in chair;with call bell/phone within reach Nurse Communication:  Mobility status PT Visit Diagnosis: Unsteadiness on feet (R26.81)    Time: 1016-1040 PT Time Calculation (min) (ACUTE ONLY): 24 min   Charges:   PT Evaluation $PT Eval Low Complexity: 1 Procedure PT Treatments $Gait Training: 8-22 mins   PT G Codes:        08-31-16  Jose [name], PT [phone]  (pager)  Jose [name] 31-Aug-2016, 10:54 AM

## 2016-08-17 NOTE — Progress Notes (Signed)
PROGRESS NOTE    Jose [name]  XBW:[id number] DOB: [birth year] DOA: 08/14/2016 PCP: [name], MD  Brief Narrative: Jose [name] is a 61 y.o. male with medical history significant of Hep C/Etoh/Cirrhosis, Hemochromatosis, chronic thrombocytopenia, recurrent hospitalizations with hepatic encephalopathy, noncompliance currently residing at a group home presented to the emergency room due to lethargy/hepatic encephalopathy. He was also noted to have a 3.5 cm liver lesion on MRI abdomen last admission, slightly larger than 08/2015, had a liver biopsy in 2017 which showed atypical liver cells but was not diagnostic for [facility].   Assessment & Plan:    Hepatic encephalopathy ([place]) -pt reports compliance with lactulose/rifaximin, he is responsible for taking his meds at the group home so cant be certain of compliance -frequent admissions for same -in addition, some suspicion he has [place], with liver lesion noted last admission with AFP of 23 -appreciate GI input, recommended Palliative eval and outpatient FU with GI -mentation better, although ammonia still high  Liver mass 3.4cm -larger than in 2017, ? [place], biopsy 2017 with no malignancy.  Per GI could be a large regenerative nodule. 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 -reports abstinence from ETOH for few weeks now -residing at a Rehab center currently -plan to hold diuretics at this time  Acute kidney injury  -likely due to poor PO intake/diuretics -diuretics stopped, no plan to resume at this time   R inguinal hernia -not a candidate for hernia repair   Thrombocytopenia/leukopenia -Secondary to cirrhosis and at baseline    Essential hypertension -Currently normotensive with underlying bradycardic rate -Not on chronotropic medications prior to admission    Protein-calorie malnutrition, severe  -Weight has been  variable over the past 12 months and likely reflective of volume changes shifts secondary to diuresis -Peak weight in March was 215 pounds and current weight is 180 pounds -RD consult    Chronic hyponatremia -Stable and near baseline of between 128 and 130   DVT prophylaxis: SCDs due to low plts Code Status: Full Family Communication: No family at bedside  Disposition Plan: back to group home pending palliative eval  Consultants:   GI  Subjective: Feels ok, more alert, still has Q regarding his hernia  Objective: Vitals:   08/16/16 1316 08/16/16 2130 08/17/16 0500 08/17/16 0619  BP: 116/69 124/73  115/63  Pulse: (!) 54 (!) 59  65  Resp: 18 18  18   Temp: 97.6 F (36.4 C) 97.4 F (36.3 C)  98.2 F (36.8 C)  TempSrc:  Oral  Oral  SpO2: 100% 100%  100%  Weight:   83.4 kg (183 lb 13.8 oz)   Height:        Intake/Output Summary (Last 24 hours) at 08/17/16 1209 Last data filed at 08/17/16 0935  Gross per 24 hour  Intake              965 ml  Output                0 ml  Net              965 ml   Filed Weights   08/15/16 0537 08/16/16 0217 08/17/16 0500  Weight: 81.6 kg (179 lb 14.3 oz) 83.2 kg (183 lb 6.8 oz) 83.4 kg (183 lb 13.8 oz)    Examination:  General exam: Chronically ill male laying in bed, in no distress, awake but  somewhat somnolent  Respiratory system: Decreased breath sounds at the bases Cardiovascular system: S1 & S2 heard, RRR.  Gastrointestinal system: Abdomen is soft, mildly distended, positive fluid thrill  Central nervous system: Awake but partially somnolent moves all extremities, no localizing signs, positive asterixis Extremities: Symmetric 5 x 5 power. Skin: No rashes, lesions or ulcers Psychiatry: Flat affect and    Data Reviewed:   CBC:  Recent Labs Lab 08/14/16 1213 08/15/16 0447  WBC 3.4* 3.8*  NEUTROABS 1.8  --   HGB 14.2 13.5  HCT 39.7 38.6*  MCV 97.5 97.5  PLT 59* 62*   Basic Metabolic Panel:  Recent Labs Lab  08/14/16 1213 08/14/16 1616 08/15/16 0447  NA 131*  --  129*  K 4.8  --  4.7  CL 105  --  102  CO2 18*  --  17*  GLUCOSE 109*  --  112*  BUN 27*  --  24*  CREATININE 1.67*  --  1.32*  CALCIUM 8.5*  --  8.8*  MG  --  2.2  --    GFR: Estimated Creatinine Clearance: 60.3 mL/min (A) (by C-G formula based on SCr of 1.32 mg/dL (H)). Liver Function Tests:  Recent Labs Lab 08/14/16 1213  AST 96*  ALT 64*  ALKPHOS 191*  BILITOT 1.8*  PROT 7.5  ALBUMIN 2.6*    Recent Labs Lab 08/14/16 1213  LIPASE 57*    Recent Labs Lab 08/14/16 1213 08/15/16 0447  AMMONIA 137* 156*   Coagulation Profile: No results for input(s): INR, PROTIME in the last 168 hours. Cardiac Enzymes: No results for input(s): CKTOTAL, CKMB, CKMBINDEX, TROPONINI in the last 168 hours. BNP (last 3 results) No results for input(s): PROBNP in the last 8760 hours. HbA1C: No results for input(s): HGBA1C in the last 72 hours. CBG: No results for input(s): GLUCAP in the last 168 hours. Lipid Profile: No results for input(s): CHOL, HDL, LDLCALC, TRIG, CHOLHDL, LDLDIRECT in the last 72 hours. Thyroid Function Tests: No results for input(s): TSH, T4TOTAL, FREET4, T3FREE, THYROIDAB in the last 72 hours. Anemia Panel: No results for input(s): VITAMINB12, FOLATE, FERRITIN, TIBC, IRON, RETICCTPCT in the last 72 hours. Urine analysis:    Component Value Date/Time   COLORURINE STRAW (A) 08/14/2016 1204   APPEARANCEUR CLEAR 08/14/2016 1204   LABSPEC 1.006 08/14/2016 1204   PHURINE 6.0 08/14/2016 1204   GLUCOSEU NEGATIVE 08/14/2016 1204   HGBUR SMALL (A) 08/14/2016 1204   BILIRUBINUR NEGATIVE 08/14/2016 1204   KETONESUR NEGATIVE 08/14/2016 1204   PROTEINUR NEGATIVE 08/14/2016 1204   UROBILINOGEN 4.0 (H) 04/26/2013 0500   NITRITE NEGATIVE 08/14/2016 1204   LEUKOCYTESUR NEGATIVE 08/14/2016 1204   Sepsis Labs: @LABRCNTIP (procalcitonin:4,lacticidven:4)  )No results found for this or any previous visit (from  the past 240 hour(s)).       Radiology Studies: No results found.      Scheduled Meds: . fluticasone  2 spray Each Nare Daily  . folic acid  1 mg Oral Daily  . lactulose  30 g Oral QID  . multivitamin with minerals  1 tablet Oral Daily  . nicotine  21 mg Transdermal Daily  . pantoprazole  40 mg Oral Daily  . rifaximin  550 mg Oral BID   Continuous Infusions:   LOS: 3 days    Time spent: 45min    Domenic Polite, MD [facility] Pager [phone]  If 7PM-7AM, please contact night-coverage [email or web address] Password [facility] 08/17/2016, 12:09 PM

## 2016-08-18 DIAGNOSIS — Z7189 Other specified counseling: Secondary | ICD-10-CM

## 2016-08-18 DIAGNOSIS — Z515 Encounter for palliative care: Secondary | ICD-10-CM

## 2016-08-18 LAB — AMMONIA: Ammonia: 120 umol/L — ABNORMAL HIGH (ref 9–35)

## 2016-08-18 NOTE — Discharge Summary (Signed)
Physician Discharge Summary  Jose [name] YFV:[id number] DOB: May 08, 1955 DOA: 08/14/2016  PCP: [name], MD  Admit date: 08/14/2016 Discharge date: 08/18/2016  Time spent: 35 minutes  Recommendations for Outpatient Follow-up:  1. Palliative Care follow up at [facility] in 3weeks 3. PCP Dr.Osei-bonsu in 1week   Discharge Diagnoses:  Principal Problem:   Hepatic encephalopathy (HCC)   End Stage Liver disease   Liver mass   Thrombocytopenia (HCC)   Macrocytic anemia   Cirrhosis of liver with ascites (HCC)   Essential hypertension   Protein-calorie malnutrition, severe (HCC)   Hepatitis C, chronic (HCC)   Chronic hyponatremia   Acute kidney injury ([place])   Liver mass   Palliative care encounter   Goals of care, counseling/discussion   Discharge Condition: stable  Diet recommendation: low sodium  Filed Weights   08/16/16 0217 08/17/16 0500 08/18/16 0529  Weight: 83.2 kg (183 lb 6.8 oz) 83.4 kg (183 lb 13.8 oz) 82.1 kg (181 lb)    History of present illness:  Jose [name] a 61 y.o.malewith medical history significant of Hep C/Etoh/Cirrhosis, Hemochromatosis, chronic thrombocytopenia, recurrent hospitalizations with hepatic encephalopathy, noncompliance currently residing at a group home presented to the emergency room due to lethargy/hepatic encephalopathy  Hospital Course:   Hepatic encephalopathy ([place]) -pt reports compliance with lactulose/rifaximin, he is responsible for taking his meds at the group home so cant be certain of compliance -frequent admissions for same, 6 hospitalizations in the last 4 months -[facility] consulted, felt to have End Stage Liver disease, recommended Palliative eval and outpatient FU with GI -mentation much improved, no further asterixis , although ammonia level still high -discharged home off diuretics, since he is often dehydrated on frequent admission and has no overt evidence of fluid overload and  only minimal ascites. -[facility] follow up in 3-4weeks, decide on diuretics then  Liver mass 3.4cm -slightly larger than in 2017, [facility] suspected but biopsy 2017 with no malignancy.  Per GI could be a large regenerative nodule. -AFP remains elevated -overall poor prognosis due to above -GI follow up recommended, no plan for repeat biopsy at this time  Cirrhosis -due to ETOH and Hep C -reports abstinence from ETOH for few weeks now -residing at a Rehab center currently -plan to hold diuretics at discharge  Acute kidney injury  -likely due to poor PO intake/diuretics -diuretics stopped, no plan to resume at this time   R inguinal hernia -very poor  candidate for hernia repair  Thrombocytopenia/leukopenia -Secondary to cirrhosis and at baseline  Essential hypertension -Currently normotensive with underlying bradycardic rate -Not on chronotropic medications prior to admission  Protein-calorie malnutrition, severe  -Weight has been variable over the past 12 months and likely reflective of volume changes shifts secondary to diuresis -Peak weight in March was 215 pounds and current weight is 180 pounds -RD consulted, supplements added  Chronic hyponatremia -Stable and near baseline of between 128 and 130  Consultations:  [facility]  Palliative medicine  Discharge Exam: Vitals:   08/17/16 2107 08/18/16 0529  BP: (!) 117/57 115/69  Pulse: (!) 58 64  Resp:  20  Temp: 98 F (36.7 C) 98 F (36.7 C)    General: AAOx1 Cardiovascular: S1S2/RRR Respiratory: CTAB  Discharge Instructions   Discharge Instructions    Diet - low sodium heart healthy    Complete by:  As directed    Increase activity slowly    Complete by:  As directed  Current Discharge Medication List    CONTINUE these medications which have NOT CHANGED   Details  albuterol (PROVENTIL HFA;VENTOLIN HFA) 108 (90 Base) MCG/ACT inhaler Inhale 2 puffs into the lungs every 6 (six) hours as  needed for wheezing or shortness of breath. Qty: 18 g, Refills: 0    fluticasone (FLONASE) 50 MCG/ACT nasal spray Place 2 sprays into both nostrils daily.    folic acid (FOLVITE) 1 MG tablet Take 1 tablet (1 mg total) by mouth daily. Qty: 30 tablet, Refills: 0    lactulose (CHRONULAC) 10 GM/15ML solution Take 45 mLs (30 g total) by mouth 4 (four) times daily. Qty: 500 mL, Refills: 2    Multiple Vitamin (MULTIVITAMIN) tablet Take 1 tablet by mouth daily.    nicotine (NICODERM CQ - DOSED IN MG/24 HOURS) 21 mg/24hr patch Place 1 patch (21 mg total) onto the skin daily. Qty: 28 patch, Refills: 0    omeprazole (PRILOSEC) 20 MG capsule Take 2 capsules (40 mg total) by mouth 2 (two) times daily before a meal. Qty: 120 capsule, Refills: 2    ondansetron (ZOFRAN) 8 MG tablet Take 8 mg by mouth every 8 (eight) hours as needed for nausea or vomiting.    rifaximin (XIFAXAN) 550 MG TABS tablet Take 1 tablet (550 mg total) by mouth 2 (two) times daily. Qty: 60 tablet, Refills: 2      STOP taking these medications     furosemide (LASIX) 40 MG tablet      potassium chloride SA (K-DUR,KLOR-CON) 20 MEQ tablet      spironolactone (ALDACTONE) 50 MG tablet      traZODone (DESYREL) 100 MG tablet        No Known Allergies Follow-up Information    OSEI-BONSU,GEORGE, MD Follow up in 1 week(s).   Specialty:  Internal Medicine Contact information: [address] [place] [address] [phone]        Tye Savoy, NP. Schedule an appointment as soon as possible for a visit in 3 week(s).   Specialty:  Gastroenterology Contact information: [place]  [address] [phone]            The results of significant diagnostics from this hospitalization (including imaging, microbiology, ancillary and laboratory) are listed below for reference.    Significant Diagnostic Studies: Ct Head Wo Contrast  Result Date: 08/14/2016 CLINICAL DATA:  Dizziness since  earlier today.  Lethargy. EXAM: CT HEAD WITHOUT CONTRAST TECHNIQUE: Contiguous axial images were obtained from the base of the skull through the vertex without intravenous contrast. COMPARISON:  07/15/2016. FINDINGS: Brain: Diffusely enlarged ventricles and subarachnoid spaces. Patchy white matter low density in both cerebral hemispheres. No intracranial hemorrhage, mass lesion or CT evidence of acute infarction. Vascular: No hyperdense vessel or unexpected calcification. Skull: Normal. Negative for fracture or focal lesion. Sinuses/Orbits: Unremarkable. Other: None. IMPRESSION: 1. No acute abnormality. 2. Stable mild diffuse cerebral and cerebellar atrophy and mild chronic small vessel white matter ischemic changes in both cerebral hemispheres. Electronically Signed   By: Claudie Revering M.D.   On: 08/14/2016 12:37   Mr Abdomen W VF Contrast  Result Date: 08/06/2016 CLINICAL DATA:  Hepatic encephalopathy. History of hepatitis c, cirrhosis, hemochromatosis, chronic thrombocytopenia. EXAM: MRI ABDOMEN WITHOUT AND WITH CONTRAST TECHNIQUE: Multiplanar multisequence MR imaging of the abdomen was performed both before and after the administration of intravenous contrast. CONTRAST:  8 cc Eovist COMPARISON:  05/20/2016 CT scan, and prior MRI from 09/05/2015 FINDINGS: Lower chest: Mild cardiomegaly. Hepatobiliary: Stable findings  of cirrhosis with nodular liver border and reticular architectural distortion throughout the liver. In segment 7, a 3.4 by 3.4 cm mass on image 58/1001 previously measured 3.4 by 2.8 cm on 09/05/2015. This mass has capsule and washout appearance. The increase in size does not qualify as threshold growth over the intervening 11 months. This is considered LI-RADS LR 5B. It is worth noting that this lesion does have high precontrast T1 signal characteristics as well, but based on manual measurements does appear to enhance. There is likely some vascular shunting posteriorly in the lateral segment left  hepatic lobe on image 52/1001. Dependent material in the gallbladder suspicious for gallstones. Pancreas:  Unremarkable Spleen: Scattered small varicoid nonenhancing lesions along the splenic hilum, similar to the prior MRI. Adrenals/Urinary Tract:  Unremarkable Stomach/Bowel: Suspected mild wall thickening in the small bowel Vascular/Lymphatic: Gastric and uphill esophageal varices. Splenorenal shunting. Aortocaval node 1.3 cm in short axis on image 112/1002. Other mildly enlarged porta hepatis and retroperitoneal lymph nodes are present. Other: There is edema in the omentum and mesentery along with a trace amount of perihepatic ascites. Musculoskeletal: Small umbilical hernia contains adipose tissue. IMPRESSION: 1. The segment 7 lesion in the liver demonstrates capsule in washout appearance along with arterial phase hyperintensity. This lesion does have high precontrast T1 signal characteristics. This has mildly increased in size compared to the prior exam but does not qualify as definitive "Interval growth" threshold. Imaging appearance remains indicative of hepatocellular carcinoma, this would normally be classified LI-RADS LR 5B. Previous biopsy indicated atypical findings which were not definitive but well differentiated hepatocellular carcinoma could not be excluded. The slow growth may also be indicative of a well differentiated hepatocellular carcinoma. 2. Possible siderotic nodules along the splenic hilum. 3. Third spacing of fluid with mesenteric and omental edema and trace perihepatic ascites. There is also potentially mild wall thickening in the small bowel. 4. Hepatic cirrhosis. 5. Gastric and paraesophageal uphill varices. 6. Probable gallstones. 7. Porta hepatis and retroperitoneal lymph nodes are probably reactive. Electronically Signed   By: Van Clines M.D.   On: 08/06/2016 07:49    Microbiology: No results found for this or any previous visit (from the past 240 hour(s)).    Labs: Basic Metabolic Panel:  Recent Labs Lab 08/14/16 1213 08/14/16 1616 08/15/16 0447  NA 131*  --  129*  K 4.8  --  4.7  CL 105  --  102  CO2 18*  --  17*  GLUCOSE 109*  --  112*  BUN 27*  --  24*  CREATININE 1.67*  --  1.32*  CALCIUM 8.5*  --  8.8*  MG  --  2.2  --    Liver Function Tests:  Recent Labs Lab 08/14/16 1213  AST 96*  ALT 64*  ALKPHOS 191*  BILITOT 1.8*  PROT 7.5  ALBUMIN 2.6*    Recent Labs Lab 08/14/16 1213  LIPASE 57*    Recent Labs Lab 08/14/16 1213 08/15/16 0447 08/18/16 0811  AMMONIA 137* 156* 120*   CBC:  Recent Labs Lab 08/14/16 1213 08/15/16 0447  WBC 3.4* 3.8*  NEUTROABS 1.8  --   HGB 14.2 13.5  HCT 39.7 38.6*  MCV 97.5 97.5  PLT 59* 62*   Cardiac Enzymes: No results for input(s): CKTOTAL, CKMB, CKMBINDEX, TROPONINI in the last 168 hours. BNP: BNP (last 3 results) No results for input(s): BNP in the last 8760 hours.  ProBNP (last 3 results) No results for input(s): PROBNP in the last 8760 hours.  CBG: No results for input(s): GLUCAP in the last 168 hours.     SignedDomenic Polite MD.  [facility] 08/18/2016, 1:54 PM

## 2016-08-18 NOTE — Care Management Note (Signed)
Case Management Note  Patient Details  Name: LOY MCCARTT MRN: [id number] Date of Birth: 1955/09/22  Subjective/Objective:   Hepatic Encephalopathy, End Stage Liver Disease                 Action/Plan: Discharge Planning: NCM contacted pt and states he is apart of a drug rehab program, [facility]. They assist him with housing while he is in the program. Contacted [facility] # [phone] spoke to [place]. He will follow up with coordinator to see if they could accommodate Palliative Care or Home Health RN visit. Waiting call back.   PCP [name] MD  Expected Discharge Date:  08/18/16               Expected Discharge Plan:  Group Home  In-House Referral:  Clinical Social Work  Discharge planning Services  NA  Post Acute Care Choice:  NA Choice offered to:  NA  DME Arranged:  N/A DME Agency:  NA  HH Arranged:  NA HH Agency:  NA  Status of Service:  Completed, signed off  If discussed at [employer] of Stay Meetings, dates discussed:    Additional Comments:  Erenest Rasher, RN 08/18/2016, 2:16 PM

## 2016-08-18 NOTE — Progress Notes (Signed)
Per patient request, CSW called [facility] to let them know patient will be returning today. They requested that patient receive his discharge paperwork before discharge.   CSW signing off.  Percell Locus Ilze Roselli LCSWA [phone]

## 2016-08-18 NOTE — Consult Note (Signed)
Consultation Note Date: 08/18/2016   Patient Name: Jose [name]  DOB: 1955-10-07  MRN: [id number]  Age / Sex: 61 y.o., male  PCP: Jose Mccreedy, MD Referring Physician: Domenic Polite, MD  Reason for Consultation: Establishing goals of care  HPI/Patient Profile: 61 y.o. male  with past medical history of cirrhosis from hep C, hemachromatosis and alcoholism.  He has been suffering with recurrent encephalopathy which has lead to 6 hospitalizations in the last 4 months.  He was admitted on 08/14/2016 with encephalopathy and acute kidney injury.  His mental status has cleared with treatment and he is about to be discharged back to his group home.   Clinical Assessment and Goals of Care:  I have reviewed medical records including EPIC notes, labs and imaging, received report from Jose [name], assessed the patient and then met at the bedside to discuss diagnosis prognosis, GOC, EOL wishes, disposition and options.  I introduced Palliative Medicine as specialized medical care for people living with serious illness. It focuses on providing relief from the symptoms and stress of a serious illness. The goal is to improve quality of life for both the patient and the family.  After speaking with his sister Jose [name] on the phone, I met with Jose [name].  Jose [name] currently lives at a group home.  He is walking well (no RW or cane) and eating well.  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  While he has supportive family, none of them live locally.   He says he can feel when the encephalopathy is coming on because he gets sleepy and weak.  We discussed doubling his lactulose when he begins to feel sleepy and weak.  Jose [name] asked if his liver would regenerate and I explained that it would not.  I also explained the coagulopathy (blood thinning) that the liver disease is causing.  From there we discussed code status - I  explained that chest compressions would likely cause internal bleeding and may make him die faster.  He agreed to reconsider his code status with input from his sister Jose [name].  We talked about outpatient Palliative follow up coming to the group home and we talked about a home health RN possibly checking on him several times a week to ensure he was not becoming encephalopathic.  After our discussion I called Jose [name] back - we discussed code status once again.  Jose [name] was agreeable to the changed and committed to discuss it with her brother.  I explained that the encephalopathy would continue to get worse.   We also discussed hospice services both at the group home and at [facility] for end of life.  I assured Jose [name] that under the care of Hospice her brother would most likely have a very peaceful passing.  Jose [name] appreciated the information and is receptive to hospice services when Jose [name] is ready for them.  Primary Decision Maker:  PATIENT    SUMMARY OF RECOMMENDATIONS    Patient qualifies for Hospice services at group home  given: 1.  End stage liver disease with refractory hepatic encephalopathy 2.  Serum albumin < 2.5, severe malnutrition 3.  Recurrent hospitalizations.  If the patient is receptive to hospice services he is eligible at any time.  For now I will recommend Home Health RN to monitor for encephalopathy and Palliative Care follow up to continue Jose [name].  Code Status/Advance Care Planning:  Full code - I believe this will change easily with Palliative follow up.    Symptom Management:   Increase lactulose PRN  Additional Recommendations (Limitations, Scope, Preferences):  Full Scope Treatment   Psycho-social/Spiritual:   Desire for further Chaplaincy support: [employer]  Prognosis:   < 6 months - secondary to ESLD  Discharge Planning: Home with Palliative Services, and hopefully home health RN      Primary Diagnoses: Present on Admission: . Hepatic encephalopathy  (Jose [name]) . Protein-calorie malnutrition, severe (Jose [name]) . Thrombocytopenia (Jose [name]) . Chronic hyponatremia . Macrocytic anemia . Hepatitis C, chronic (Jose [name]) . Essential hypertension . Cirrhosis of liver with ascites (Jose [name]) . Acute kidney injury (Jose [name])   I have reviewed the medical record, interviewed the patient and family, and examined the patient. The following aspects are pertinent.  Past Medical History:  Diagnosis Date  . Alcohol abuse   . Alcohol dependence (Jose [name])   . Alcoholism (Jose [name]) 07/12/2015  . Ascites   . Cirrhosis (Jose [name])   . Depression   . Hepatitis C   . Hypertension   . QT prolongation   . Shortness of breath    with exertion  . Thrombocytopenia [facility] [name])    Social History   Social History  . Marital status: Single    Spouse name: N/A  . Number of children: N/A  . Years of education: N/A   Social History Main Topics  . Smoking status: Current Some Day Smoker  . Smokeless tobacco: Never Used  . Alcohol use Yes     Comment: A couple beers every few days   . Drug use: Yes    Types: Marijuana, Cocaine     Comment: last used 11/03/13  . 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 None   Social History Narrative   ** Merged History Encounter **       Family History  Problem Relation Age of Onset  . Breast cancer Other   . Diabetes Other   . Hypertension Other   . Breast cancer Mother   . Hypertension Mother   . Diabetes Father   . Hypertension Sister   . Heart disease Sister   . Hypertension Paternal Grandmother   . Colon cancer Neg Hx    Scheduled Meds: . fluticasone  2 spray Each Nare Daily  . folic acid  1 mg Oral Daily  . lactulose  30 g Oral QID  . multivitamin with minerals  1 tablet Oral Daily  . nicotine  21 mg Transdermal Daily  . pantoprazole  40 mg Oral Daily  . rifaximin  550 mg Oral BID   Continuous Infusions: PRN Meds:.acetaminophen **OR** acetaminophen, albuterol, ondansetron **OR**  ondansetron (ZOFRAN) IV No Known Allergies Review of Systems Eating well, walking. +pain from inguinal hernia.  Complaints of recurrent encephalopathy and weakness  Physical Exam  Wd pleasant male,  A&O, cooperative rrr, +systolic murmur resp NAD Abdomen distended but soft. Able to move all 4 extremities.   Vital Signs: BP 115/69 (BP Location: Left Arm)   Pulse 64  Temp 98 F (36.7 C) (Oral)   Resp 20   Ht _0  (1.676 m)   Wt 82.1 kg (181 lb)   SpO2 100%   BMI 29.21 kg/m  Pain Assessment: No/denies pain   Pain Score: 0-No pain   SpO2: SpO2: 100 % O2 Device:SpO2: 100 % O2 Flow Rate: .   IO: Intake/output summary:  Intake/Output Summary (Last 24 hours) at 08/18/16 1241 Last data filed at 08/18/16 0940  Gross per 24 hour  Intake             1020 ml  Output                0 ml  Net             1020 ml    LBM: Last BM Date: 08/18/16 Baseline Weight: Weight: 81.6 kg (180 lb) Most recent weight: Weight: 82.1 kg (181 lb)     Palliative Assessment/Data:     Time In: 12:00 Time Out: 1:10 Time Total: 70 min. Greater than 50%  of this time was spent counseling and coordinating care related to the above assessment and plan.  Signed by: Imogene Burn, PA-C Palliative Medicine Pager: [phone]  Please contact Palliative Medicine Team phone at [phone] for questions and concerns.  For individual provider: See Shea Evans

## 2016-08-18 NOTE — Progress Notes (Signed)
Rolland Porter Vega to be D/C'd Home per MD order.  Discussed with the patient and all questions fully answered.  VSS, Skin clean, dry and intact without evidence of skin break down, no evidence of skin tears noted. IV catheter discontinued intact. Site without signs and symptoms of complications. Dressing and pressure applied.  An After Visit Summary was printed and given to the patient. Patient received prescription.  D/c education completed with patient/family including follow up instructions, medication list, d/c activities limitations if indicated, with other d/c instructions as indicated by MD - patient able to verbalize understanding, all questions fully answered.   Patient instructed to return to ED, call 911, or call MD for any changes in condition.   Patient escorted via [place], and D/C home via private auto.  Luci Bank 08/18/2016 4:08 PM

## 2016-08-25 MED FILL — TRIAMTERENE-HCTZ 37.5-25 MG: 37.5-25 | 30 days supply | Qty: 30 | Fill #0

## 2016-08-25 MED FILL — XIFAXAN 550 MG TABLET: 550 | 30 days supply | Qty: 60 | Fill #1

## 2016-09-02 ENCOUNTER — Emergency Department (HOSPITAL_COMMUNITY)
Admission: EM | Admit: 2016-09-02 | Discharge: 2016-09-02 | Disposition: A | Discharge status: Home / Self Care | Payer: Medicaid Other | Attending: Emergency Medicine | Admitting: Emergency Medicine

## 2016-09-02 ENCOUNTER — Encounter (HOSPITAL_COMMUNITY): Payer: Self-pay | Admitting: Emergency Medicine

## 2016-09-02 DIAGNOSIS — S81812A Laceration without foreign body, left lower leg, initial encounter: Secondary | ICD-10-CM

## 2016-09-02 DIAGNOSIS — S81811A Laceration without foreign body, right lower leg, initial encounter: Secondary | ICD-10-CM | POA: Diagnosis not present

## 2016-09-02 DIAGNOSIS — Y9289 Other specified places as the place of occurrence of the external cause: Secondary | ICD-10-CM | POA: Insufficient documentation

## 2016-09-02 DIAGNOSIS — Y999 Unspecified external cause status: Secondary | ICD-10-CM | POA: Insufficient documentation

## 2016-09-02 DIAGNOSIS — Y9301 Activity, walking, marching and hiking: Secondary | ICD-10-CM | POA: Insufficient documentation

## 2016-09-02 DIAGNOSIS — W16322A Fall into other water striking bottom causing other injury, initial encounter: Secondary | ICD-10-CM | POA: Insufficient documentation

## 2016-09-02 DIAGNOSIS — K4090 Unilateral inguinal hernia, without obstruction or gangrene, not specified as recurrent: Secondary | ICD-10-CM | POA: Diagnosis not present

## 2016-09-02 DIAGNOSIS — M791 Myalgia, unspecified site: Secondary | ICD-10-CM

## 2016-09-02 DIAGNOSIS — S8991XA Unspecified injury of right lower leg, initial encounter: Secondary | ICD-10-CM | POA: Diagnosis present

## 2016-09-02 MED ORDER — BACITRACIN ZINC 500 UNIT/GM EX OINT
TOPICAL_OINTMENT | Freq: Once | CUTANEOUS | Status: AC
  Administered 2016-09-02: 1 via TOPICAL
  Filled 2016-09-02: qty 1.8

## 2016-09-02 NOTE — ED Provider Notes (Signed)
Medical screening examination/treatment/procedure(s) were conducted as a shared visit with non-physician practitioner(s) and myself.  I personally evaluated the patient during the encounter.   EKG Interpretation None      60yM s/p fall. Mechanical. On exam, he has a large R inguinal hernia. Reduces. No obstructive symptoms. FU with surgery but no one would likely want to operate with his medical history unless it becomes incarcerated. Some small wounds to shins. Wound care. I doubt fx. Mid back tenderness. Likely strain. Low risk mechanism. I doubt fx or cord injury. No acute neurological complaints.    Virgel Manifold, MD 09/02/16 1304

## 2016-09-02 NOTE — Discharge Instructions (Signed)
Please read instructions below. Keep the wounds on her legs clean and dry, apply bacitracin ointment. Apply ice to your legs as needed for 20 minutes at a time. You may be sore for the next few days, this is normal, it should start improving in the next couple days. Follow-up with your surgeon regarding your hernia. Return to the ER for fever, pus draining from your wounds, if you stop having bowel movements, new or worsening symptoms.

## 2016-09-02 NOTE — ED Provider Notes (Signed)
[facility] DEPT Provider Note   CSN: [id number] Arrival date & time: 09/02/16  1120   By signing my name below, I, Evelene Croon, attest that this documentation has been prepared under the direction and in the presence of Martinique Russo, PA-C. Electronically Signed: Evelene Croon, Scribe. 09/02/2016. 12:38 PM.   History   Chief Complaint Chief Complaint  Patient presents with  . Fall     The history is provided by the patient. No language interpreter was used.     HPI Comments:  Jose [name] is a 61 y.o. male  with a history of alcohol abuse, hepatitis C, HTN, right inguinal hernia, who presents to the Emergency Department s/p fall yesterday complaining of mulitple abrasions to his BLE with moderate surrounding pain following the accident. Pt was walking when he was blinded by the sun and fell into a "water meter"; no LOC or head injury. He states he caught himself with his BUE w associated diffusely achy b/l shoulders and arms. No acute numbness/tingling in his BLE.  He reports associated mild HA, and mild back and bilateral shoulder soreness. No alleviating factors noted.   He also notes hernia to right groin region that is intermittently painful. Last episode of pain began this AM. He states he has been advised to have surgery. Pt's last BM was today. No acute urinary symptoms. No bowel/bladder incontinence or blood in stool.  Past Medical History:  Diagnosis Date  . Alcohol abuse   . Alcohol dependence ([place])   . Alcoholism ([place]) 07/12/2015  . Ascites   . Cirrhosis ([place])   . Depression   . Hepatitis C   . Hypertension   . QT prolongation   . Shortness of breath    with exertion  . Thrombocytopenia [facility])     Patient Active Problem List   Diagnosis Date Noted  . Palliative care encounter   . Goals of care, counseling/discussion   . Liver mass   . Acute kidney injury ([place]) 08/14/2016  . Chronic hyponatremia 07/30/2016  . Chronic hepatitis C without hepatic coma ([place])  06/06/2016  . Elevated lactic acid level 05/10/2016  . Acute hepatic encephalopathy 04/26/2016  . 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 Hepatitis C, chronic ([place]) 01/04/2016  . Right inguinal hernia 09/12/2015  . Essential hypertension 09/12/2015  . Gastroesophageal reflux disease without esophagitis 09/12/2015  . Protein-calorie malnutrition, severe ([place]) 09/12/2015  . Anemia of chronic disease 09/12/2015  . Arthritis of left knee 09/12/2015  . Cirrhosis of liver with ascites ([place])   . Elevated LFTs   . Weakness 09/03/2015  . Generalized weakness   . Physical deconditioning   . Acute-on-chronic renal failure ([place])   . Alcoholic cirrhosis of liver with ascites ([place]) 07/25/2015  . Macrocytic anemia 07/23/2015  . Alcoholism ([place]) 07/12/2015  . ARF (acute renal failure) ([place]) 06/27/2015  . Hepatic encephalopathy ([place]) 06/26/2015  . Trichomonas infection 06/26/2015  . 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 Adenomatous polyps 10/18/2013  . Unspecified vitamin D deficiency 07/13/2013  . Atopic dermatitis 04/28/2013  . Thrombocytopenia ([place]) 04/28/2013  . Anasarca 04/26/2013  . UTI (urinary tract infection) 04/26/2013    Past Surgical History:  Procedure Laterality Date  . CIRCUMCISION    . COLONOSCOPY  march 2015   Dr. Renee Harder: nodular mucosa at appendiceal orifice, tubular adenoma, extremely poor prep  . COLONOSCOPY        Home Medications    Prior to Admission medications   Medication Sig Start Date End Date Taking? Authorizing  Provider  albuterol (PROVENTIL HFA;VENTOLIN HFA) 108 (90 Base) MCG/ACT inhaler Inhale 2 puffs into the lungs every 6 (six) hours as needed for wheezing or shortness of breath. 07/18/16   Eugenie Filler, MD  fluticasone [facility]) 50 MCG/ACT nasal spray Place 2 sprays into both nostrils daily.    [provider]  folic acid (FOLVITE) 1 MG tablet Take 1 tablet (1 mg total) by mouth daily. 06/09/16   Ghimire, Henreitta Leber, MD  lactulose (CHRONULAC) 10  GM/15ML solution Take 45 mLs (30 g total) by mouth 4 (four) times daily. Patient taking differently: Take 30 g by mouth 3 (three) times daily.  08/06/16   Reyne Dumas, MD  Multiple Vitamin (MULTIVITAMIN) tablet Take 1 tablet by mouth daily.    [provider]  nicotine (NICODERM CQ - DOSED IN MG/24 HOURS) 21 mg/24hr patch Place 1 patch (21 mg total) onto the skin daily. 07/18/16   Eugenie Filler, MD  omeprazole (PRILOSEC) 20 MG capsule Take 2 capsules (40 mg total) by mouth 2 (two) times daily before a meal. 07/18/16   Eugenie Filler, MD  ondansetron (ZOFRAN) 8 MG tablet Take 8 mg by mouth every 8 (eight) hours as needed for nausea or vomiting.    [provider]  rifaximin (XIFAXAN) 550 MG TABS tablet Take 1 tablet (550 mg total) by mouth 2 (two) times daily. 08/06/16   Reyne Dumas, MD    Family History Family History  Problem Relation Age of Onset  . Breast cancer Other   . Diabetes Other   . Hypertension Other   . Breast cancer Mother   . Hypertension Mother   . Diabetes Father   . Hypertension Sister   . Heart disease Sister   . Hypertension Paternal Grandmother   . Colon cancer Neg Hx     Social History Social History  Substance Use Topics  . Smoking status: Current Some Day Smoker  . Smokeless tobacco: Never Used  . Alcohol use Yes     Comment: A couple beers every few days      Allergies   Patient has no known allergies.   Review of Systems Review of Systems  Gastrointestinal:       Scrotal pain 2/t hernia.   Musculoskeletal: Positive for myalgias.  Skin: Positive for wound.  Neurological: Negative for syncope, weakness and headaches.  All other systems reviewed and are negative.    Physical Exam Updated Vital Signs BP 102/90 (BP Location: Right Arm)   Pulse (!) 55   Temp 98 F (36.7 C)   Resp 18   Ht 5\' 6"  (1.676 m)   Wt 88 kg   SpO2 100%   BMI 31.31 kg/m   Physical Exam  Constitutional: He appears well-developed and  well-nourished.  HENT:  Head: Normocephalic and atraumatic.  Eyes: Conjunctivae are normal.  Cardiovascular:  Slightly bradycardic  Pulmonary/Chest: Effort normal.  Abdominal: Bowel sounds are normal. There is no tenderness. A hernia is present. Hernia confirmed positive in the right inguinal area.  Reducible umbilical hernia. Right indirect inguinal hernia size of a large orange, reducible.  Genitourinary:  Genitourinary Comments: Chaperone (scribe) was present for exam which was performed with no discomfort or complications.   Musculoskeletal:  Mild tenderness bilateral trapezius Normal range of motion bilateral shoulders. No crepitus, no deformities. Patient able to ambulate without assistance  Neurological: He is alert. No sensory deficit.  5/5 b/l BUE   Skin:  Superficial coin-sized skin tears to dsital right lower  anterior leg and proximal left anterior lower leg. Not grossly contaminated, no surrounding erythema. No purulent drainage  Psychiatric: He has a normal mood and affect. His behavior is normal.  Nursing note and vitals reviewed.    ED Treatments / Results  DIAGNOSTIC STUDIES:  Oxygen Saturation is 100% on RA, normal by my interpretation.    COORDINATION OF CARE:  12:35 PM Discussed treatment plan with pt at bedside and pt agreed to plan.  Labs (all labs ordered are listed, but only abnormal results are displayed) Labs Reviewed - No data to display  EKG  EKG Interpretation None       Radiology No results found.  Procedures Procedures (including critical care time)  Medications Ordered in ED Medications  bacitracin ointment (1 application Topical Given 09/02/16 1340)     Initial Impression / Assessment and Plan / ED Course  I have reviewed the triage vital signs and the nursing notes.  Pertinent labs & imaging results that were available during my care of the patient were reviewed by me and considered in my medical decision making (see chart for  details).     Patient with bilateral lower extremity skin tears status post mechanical fall. Also with bilateral upper extremity muscle soreness. Increased pain to chronic reducible right inguinal hernia, pt is not having signs of obstruction. No head trauma or LOC. Neurovascularly intact. Wound care performed, bacitracin applied. Patient to follow-up with surgery for his inguinal hernia. Symptomatic treatment for muscle aches. Patient safe for discharge home with primary care follow up.  Patient discussed with and seen by Dr. Wilson Singer, who agrees with care plan. Discussed results, findings, treatment and follow up. Patient advised of return precautions. Patient verbalized understanding and agreed with plan.   Final Clinical Impressions(s) / ED Diagnoses   Final diagnoses:  Skin tear of right lower leg without complication, initial encounter  Skin tear of left lower leg without complication, initial encounter  Muscle soreness  Inguinal hernia of right side without obstruction or gangrene    New Prescriptions Discharge Medication List as of 09/02/2016  1:53 PM     I personally performed the services described in this documentation, which was scribed in my presence. The recorded information has been reviewed and is accurate.     Russo, [place] N, PA-C 09/02/16 1717    Virgel Manifold, MD 09/02/16 2110

## 2016-09-02 NOTE — ED Triage Notes (Signed)
To ED via bus from home with c/o stepped into a "water meter" yesterday-- abrasions to shins bilaterally-- pt recently got out of hospital -- states that hernia in left abd hurts also.

## 2016-09-04 MED FILL — OMEPRAZOLE DR 20 MG CAPSULE: 20 | 30 days supply | Qty: 120 | Fill #1

## 2016-09-10 ENCOUNTER — Encounter: Payer: Self-pay | Admitting: Gastroenterology

## 2016-09-10 ENCOUNTER — Encounter (INDEPENDENT_AMBULATORY_CARE_PROVIDER_SITE_OTHER): Payer: Self-pay

## 2016-09-10 ENCOUNTER — Other Ambulatory Visit (INDEPENDENT_AMBULATORY_CARE_PROVIDER_SITE_OTHER): Payer: Medicaid Other

## 2016-09-10 ENCOUNTER — Ambulatory Visit (INDEPENDENT_AMBULATORY_CARE_PROVIDER_SITE_OTHER): Payer: Medicaid Other | Admitting: Gastroenterology

## 2016-09-10 VITALS — BP 146/76 | HR 64

## 2016-09-10 DIAGNOSIS — B182 Chronic viral hepatitis C: Secondary | ICD-10-CM | POA: Diagnosis not present

## 2016-09-10 DIAGNOSIS — K7210 Chronic hepatic failure without coma: Secondary | ICD-10-CM | POA: Insufficient documentation

## 2016-09-10 DIAGNOSIS — K7290 Hepatic failure, unspecified without coma: Secondary | ICD-10-CM

## 2016-09-10 DIAGNOSIS — K7031 Alcoholic cirrhosis of liver with ascites: Secondary | ICD-10-CM | POA: Diagnosis not present

## 2016-09-10 LAB — COMPREHENSIVE METABOLIC PANEL
ALK PHOS: 249 U/L — AB (ref 39–117)
ALT: 40 U/L (ref 0–53)
AST: 73 U/L — ABNORMAL HIGH (ref 0–37)
Albumin: 2.6 g/dL — ABNORMAL LOW (ref 3.5–5.2)
BUN: 7 mg/dL (ref 6–23)
CO2: 27 mEq/L (ref 19–32)
Calcium: 7.7 mg/dL — ABNORMAL LOW (ref 8.4–10.5)
Chloride: 111 mEq/L (ref 96–112)
Creatinine, Ser: 1.04 mg/dL (ref 0.40–1.50)
GFR: 93.56 mL/min (ref >60.00)
GLUCOSE: 144 mg/dL — AB (ref 70–99)
POTASSIUM: 4.1 meq/L (ref 3.5–5.1)
SODIUM: 139 meq/L (ref 135–145)
TOTAL PROTEIN: 6.1 g/dL (ref 6.0–8.3)
Total Bilirubin: 1.7 mg/dL — ABNORMAL HIGH (ref 0.2–1.2)

## 2016-09-10 LAB — PROTIME-INR
INR: 1.4 ratio — ABNORMAL HIGH (ref 0.8–1.0)
Prothrombin Time: 15.5 s — ABNORMAL HIGH (ref 9.6–13.1)

## 2016-09-10 LAB — CBC WITH DIFFERENTIAL/PLATELET
BASOS ABS: 0 10*3/uL (ref 0.0–0.1)
Basophils Relative: 0.9 % (ref 0.0–3.0)
EOS ABS: 0.1 10*3/uL (ref 0.0–0.7)
Eosinophils Relative: 3 % (ref 0.0–5.0)
HCT: 37.3 % — ABNORMAL LOW (ref 39.0–52.0)
Hemoglobin: 12.7 g/dL — ABNORMAL LOW (ref 13.0–17.0)
Lymphocytes Relative: 26.1 % (ref 12.0–46.0)
Lymphs Abs: 0.7 10*3/uL (ref 0.7–4.0)
MCHC: 34 g/dL (ref 30.0–36.0)
MCV: 103.8 fl — ABNORMAL HIGH (ref 78.0–100.0)
MONO ABS: 0.7 10*3/uL (ref 0.1–1.0)
Monocytes Relative: 23.7 % — ABNORMAL HIGH (ref 3.0–12.0)
NEUTROS PCT: 46.3 % (ref 43.0–77.0)
Neutro Abs: 1.3 10*3/uL — ABNORMAL LOW (ref 1.4–7.7)
Platelets: 50 10*3/uL — ABNORMAL LOW (ref 150.0–400.0)
RBC: 3.59 Mil/uL — AB (ref 4.22–5.81)
RDW: 15.1 % (ref 11.5–15.5)
WBC: 2.8 10*3/uL — ABNORMAL LOW (ref 4.0–10.5)

## 2016-09-10 LAB — AMMONIA: Ammonia: 44 umol/L — ABNORMAL HIGH (ref 11–35)

## 2016-09-10 NOTE — Progress Notes (Signed)
09/10/2016 Jose [name] [id number] 12-01-1955   HISTORY OF PRESENT ILLNESS:  This patient was new to [place] during recent hospitalization, seen in consult on 08/15/2016.  Per Dr. Corena Pilgrim noted:  "Endstage cirrhosis from a combination of alcohol abuse and untreated chronic hepatitis C infection  Recurrent admissions for hepatic encephalopathy.  I'm concerned about there may be noncompliance with lactulose treatments, and/or perhaps the lactulose is just not effective enough to control his condition.  He has minimal ascites on imaging studies.  His recurrent admissions also include volume depletion and acute on chronic kidney disease.  The liver mass has hardly increased in size from last year, largely speaking against hepatocellular carcinoma. The biopsy last year found no malignancy. 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  I agree with this man's other providers that his long-term prognosis is quite poor. He is certainly not a liver transplant candidate with fairly recent alcohol use and uncertain social support. Patient says he is able to get his medicines at the [facility], and he typically uses public transit to get to the pharmacy. He has not followed up with any gastroenterologist and has recurrent admissions for encephalopathy. Palliative care consult definitely seems in order. His insight into the nature and severity of his condition however is questionable.  He seems to feel that he will be having surgery for his right inguinal hernia in the near future. I do not know if those plans, but I I would not proceed with surgery on this man unless he has an incarcerated hernia requiring emergency surgical intervention. He is high risk for complications of surgery including, but not limited to, bleeding, hepatic failure from anesthesia, and prolonged hospitalization due to hepatic encephalopathy or infection.  I think the best plan at this point is to  completely discontinue his diuretics he keeps getting volume depletion and hyponatremia and altered mental status. He has minimal ascites on recent imaging.  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  I would not pursue a repeat biopsy of the mass. He needs to follow up with gastroenterology and plan repeat imaging and alpha-fetoprotein in about 6 months.  I agree with the addition of rifaximin. At that his home regimen should be rifaximin 550 mg twice daily and lactulose 10 g twice daily.  There is no further testing and would do at this time. When his mental status has cleared and he is deemed appropriate for discharge to the medical service, that he should be given instructions to follow-up with the NP in our clinic in 3-4 weeks."  Patient has been doing better since discharged on xifaxan as well as lactulose.  Says that he is still taking lactulose 45 mL's TID and is having 3-4 soft BM's daily.  His sister is with him today and says that his confusion has been better.  He is no longer on his diuretics as stated above.  Palliative care did see him and addressed some issues.  They were going to have home health nurse to see him several times per week, but the patient says that they have not come to see him and does not know what happened with all of that.  He does not have any new complaints.  Just of note, we have records from a colonoscopy performed by Dr. Renee Harder  in March 2015 at which time the patient was found to have a nodular mucosa surrounding the appendiceal orifice that was biopsied, 5 mm polyp in the rectosigmoid colon that was removed, and poor colon prep limiting exam. We do not have the pathology results available.  Past Medical History:  Diagnosis Date  . Alcohol abuse   . Alcohol dependence  ([place])   . Alcoholism ([place]) 07/12/2015  . Ascites   . Cirrhosis ([place])   . Depression   . Hepatitis C   . Hypertension   . QT prolongation   . Shortness of breath    with exertion  . Thrombocytopenia ([place])    Past Surgical History:  Procedure Laterality Date  . CIRCUMCISION    . COLONOSCOPY  march 2015   Dr. Renee Harder: nodular mucosa at appendiceal orifice, tubular adenoma, extremely poor prep  . COLONOSCOPY      reports that he has been smoking.  He has never used smokeless tobacco. He reports that he drinks alcohol. He reports that he uses drugs, including Marijuana and Cocaine. family history includes Breast cancer in his mother and other; Diabetes in his father and other; Heart disease in his sister; Hypertension in his mother, other, paternal grandmother, and sister. No Known Allergies    Outpatient Encounter Prescriptions as of 61/16/2018  Medication Sig  . fluticasone (FLONASE) 50 MCG/ACT nasal spray Place 2 sprays into both nostrils daily.  . folic acid (FOLVITE) 1 MG tablet Take 1 tablet (1 mg total) by mouth daily.  Marland Kitchen lactulose (CHRONULAC) 10 GM/15ML solution Take 45 mLs (30 g total) by mouth 4 (four) times daily. (Patient taking differently: Take 30 g by mouth 3 (three) times daily. )  . Multiple Vitamin (MULTIVITAMIN) tablet Take 1 tablet by mouth daily.  Marland Kitchen omeprazole (PRILOSEC) 20 MG capsule Take 2 capsules (40 mg total) by mouth 2 (two) times daily before a meal.  . ondansetron (ZOFRAN) 8 MG tablet Take 8 mg by mouth every 8 (eight) hours as needed for nausea or vomiting.  . rifaximin (XIFAXAN) 550 MG TABS tablet Take 1 tablet (550 mg total) by mouth 2 (two) times daily.  . [DISCONTINUED] albuterol (PROVENTIL HFA;VENTOLIN HFA) 108 (90 Base) MCG/ACT inhaler Inhale 2 puffs into the lungs every 6 (six) hours as needed for wheezing or shortness of breath.  . [DISCONTINUED] nicotine (NICODERM CQ - DOSED IN MG/24 HOURS) 21 mg/24hr patch Place 1 patch (21 mg total) onto the skin  daily.   No facility-administered encounter medications on file as of 61/16/2018.      REVIEW OF SYSTEMS  : All other systems reviewed and negative except where noted in the History of Present Illness.   PHYSICAL EXAM: BP (!) 146/76 (BP Location: Left Arm, Patient Position: Sitting, Cuff Size: Normal)   Pulse 64  General: Well developed black male in no acute distress Head: Normocephalic and atraumatic Eyes:  Sclerae appear icteric. Ears: Normal auditory acuity Lungs: Clear throughout to auscultation; no increased WOB. Heart: Regular rate and rhythm Abdomen: Soft, non-distended. Normal bowel sounds.  Non-tender.  Umbilical hernia noted; reducible and non-tender. Musculoskeletal: Symmetrical with no gross deformities  Skin: No lesions on visible extremities Extremities:  Slight pitting edema in B/L LE's.  Bandage on left leg from recent injury/wound. Neurological: Alert oriented x 4, grossly non-focal Psychological:  Alert and cooperative. Normal mood and affect  ASSESSMENT AND PLAN: 1. 61 yo male with cirrhosis, presumably combination of ETOH, HCV (positive antibody) and hemachromatosis followed by Hematology and undergoing  phlebotomy. 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 Several admissions for hepatic encephalopathy.  No ETOH in almost 3 months. UDS negative.  HE seems to be stable since last hospital discharge.  Will continue xifaxan 550 mg BID and lactulose at current dose of 45 mL three times daily.  Will check CBC, CMP, PT/INR, and ammonia level today. 2. Large hernia (?inguinal) hernia.  Patient at high risk for decompensation with any surgery.  *Follow-up in approximately 8 weeks.  CC:  [name], MD

## 2016-09-10 NOTE — Patient Instructions (Addendum)
If you are age 61 or older, your body mass index should be between 23-30. Your There is no height or weight on file to calculate BMI. If this is out of the aforementioned range listed, please consider follow up with your Primary Care Provider.  If you are age 68 or younger, your body mass index should be between 19-25. Your There is no height or weight on file to calculate BMI. If this is out of the aformentioned range listed, please consider follow up with your Primary Care Provider.   Your physician has requested that you go to the basement for the following lab work before leaving today: CBC with Diff CMET PT/INR  Follow up with Dr. Loletha Carrow on November 14, 2016 at 200 pm.  Thank you for choosing me and [facility].  Alonza Bogus, PA-C

## 2016-09-11 MED FILL — FUROSEMIDE 40 MG TABLET: 40 | 30 days supply | Qty: 60 | Fill #0

## 2016-09-11 MED FILL — GENERLAC 10 GM/15 ML SOLN: 10 | 30 days supply | Qty: 4050 | Fill #0

## 2016-09-11 NOTE — Progress Notes (Signed)
Thank you for sending this case to me. I have reviewed the entire note, and the outlined plan seems appropriate.  His long term prognosis remains poor.  Treating encephalopathy is the priority.  Wilfrid Lund, MD

## 2016-09-12 MED FILL — PROAIR HFA 90 MCG INHALER: 108 (90 BAS | 25 days supply | Qty: 9 | Fill #0

## 2016-09-24 MED FILL — TRIAMTERENE-HCTZ 37.5-25 MG: 37.5-25 | 30 days supply | Qty: 30 | Fill #0

## 2016-10-06 ENCOUNTER — Ambulatory Visit (INDEPENDENT_AMBULATORY_CARE_PROVIDER_SITE_OTHER): Payer: Medicaid Other

## 2016-10-06 ENCOUNTER — Ambulatory Visit (INDEPENDENT_AMBULATORY_CARE_PROVIDER_SITE_OTHER): Payer: Medicaid Other | Admitting: Orthopaedic Surgery

## 2016-10-06 DIAGNOSIS — M79604 Pain in right leg: Secondary | ICD-10-CM

## 2016-10-06 NOTE — Progress Notes (Signed)
Office Visit Note   Patient: Jose [name]           Date of Birth: 06-22-1955           MRN: [id number] Visit Date: 10/06/2016              Requested by: [name], MD [id number] [place], [place] [address] PCP: [name], MD   Assessment & Plan: Visit Diagnoses:  1. Pain in right leg     Plan: Overall findings are consistent with soft tissue contusion.  I recommend symptomatic treatment. I do think his swelling contributes to his pain. He may need to consider seen his PCP about improving this. Follow up with me as needed. Total face to face encounter time was greater than 45 minutes and over half of this time was spent in counseling and/or coordination of care.  Follow-Up Instructions: Return if symptoms worsen or fail to improve.   Orders:  Orders Placed This Encounter  Procedures  . XR Tibia/Fibula Right   No orders of the defined types were placed in this encounter.     Procedures: No procedures performed   Clinical Data: No additional findings.   Subjective: Chief Complaint  Patient presents with  . Right Leg - Pain    Patient is a 61 year old gentleman who comes in with acute right lower leg pain secondary to fall. He states that he struck his right leg onto some sort of a meter. He has pain along the lower half of his lower leg. He denies any numbness or tingling. He is overall poor historian.    Review of Systems  Constitutional: Negative.   All other systems reviewed and are negative.    Objective: Vital Signs: There were no vitals taken for this visit.  Physical Exam  Constitutional: He is oriented to person, place, and time. He appears well-developed and well-nourished.  HENT:  Head: Normocephalic and atraumatic.  Eyes: Pupils are equal, round, and reactive to light.  Neck: Neck supple.  Pulmonary/Chest: Effort normal.  Abdominal: Soft.  Musculoskeletal: Normal range of motion.  Neurological: He is alert  and oriented to person, place, and time.  Skin: Skin is warm.  Psychiatric: He has a normal mood and affect. His behavior is normal. Judgment and thought content normal.  Nursing note and vitals reviewed.   Ortho Exam Right lower extremity shows multiple scars on the anterior aspect of the leg. There is no active acute wounds. He does have a significant amount of pitting edema. His skin is also hard. He has normal range of motion of the ankle and the knee. He feels discomfort with palpation of the lower leg. Specialty Comments:  No specialty comments available.  Imaging: Xr Tibia/fibula Right  Result Date: 10/06/2016 No acute or structural abnormalities    PMFS History: Patient Active Problem List   Diagnosis Date Noted  . Pain in right leg 10/06/2016  . End stage liver disease ([place]) 09/10/2016  . Palliative care encounter   . Goals of care, counseling/discussion   . Liver mass   . Acute kidney injury ([place]) 08/14/2016  . Chronic hyponatremia 07/30/2016  . Chronic hepatitis C without hepatic coma ([place]) 06/06/2016  . Elevated lactic acid level 05/10/2016  . Acute hepatic encephalopathy 04/26/2016  . 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 Hepatitis C, chronic ([place]) 01/04/2016  . Right inguinal hernia 09/12/2015  . Essential hypertension 09/12/2015  . Gastroesophageal reflux disease without esophagitis 09/12/2015  . Protein-calorie malnutrition, severe ([place])  09/12/2015  . Anemia of chronic disease 09/12/2015  . Arthritis of left knee 09/12/2015  . Cirrhosis of liver with ascites ([place])   . Elevated LFTs   . Weakness 09/03/2015  . Generalized weakness   . Physical deconditioning   . Acute-on-chronic renal failure ([place])   . Alcoholic cirrhosis of liver with ascites ([place]) 07/25/2015  . Macrocytic anemia 07/23/2015  . Alcoholism ([place]) 07/12/2015  . ARF (acute renal failure) ([place]) 06/27/2015  . Hepatic encephalopathy ([place]) 06/26/2015  . Trichomonas infection 06/26/2015  . Trichomonal  urethritis in male   . Adenomatous polyps 10/18/2013  . Unspecified vitamin D deficiency 07/13/2013  . Atopic dermatitis 04/28/2013  . Thrombocytopenia ([place]) 04/28/2013  . Anasarca 04/26/2013  . UTI (urinary tract infection) 04/26/2013   Past Medical History:  Diagnosis Date  . Alcohol abuse   . Alcohol dependence ([place])   . Alcoholism ([place]) 07/12/2015  . Ascites   . Cirrhosis ([place])   . Depression   . Hepatitis C   . Hypertension   . QT prolongation   . Shortness of breath    with exertion  . Thrombocytopenia ([place])     Family History  Problem Relation Age of Onset  . Breast cancer Other   . Diabetes Other   . Hypertension Other   . Breast cancer Mother   . Hypertension Mother   . Diabetes Father   . Hypertension Sister   . Heart disease Sister   . Hypertension Paternal Grandmother   . Colon cancer Neg Hx     Past Surgical History:  Procedure Laterality Date  . CIRCUMCISION    . COLONOSCOPY  march 2015   Dr. Renee Harder: nodular mucosa at appendiceal orifice, tubular adenoma, extremely poor prep  . COLONOSCOPY     Social History   Occupational History  . Not on file.   Social History Main Topics  . Smoking status: Current Some Day Smoker  . Smokeless tobacco: Never Used  . Alcohol use Yes     Comment: A couple beers every few days   . Drug use: Yes    Types: Marijuana, Cocaine     Comment: last used 11/03/13  . 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

## 2016-10-07 ENCOUNTER — Ambulatory Visit (HOSPITAL_BASED_OUTPATIENT_CLINIC_OR_DEPARTMENT_OTHER): Payer: Medicaid Other

## 2016-10-07 ENCOUNTER — Other Ambulatory Visit (HOSPITAL_BASED_OUTPATIENT_CLINIC_OR_DEPARTMENT_OTHER): Payer: Medicaid Other

## 2016-10-07 LAB — CBC & DIFF AND RETIC
BASO%: 1 % (ref 0.0–2.0)
BASOS ABS: 0 10*3/uL (ref 0.0–0.1)
EOS%: 2.4 % (ref 0.0–7.0)
Eosinophils Absolute: 0.1 10*3/uL (ref 0.0–0.5)
HEMATOCRIT: 39.3 % (ref 38.4–49.9)
HGB: 13.4 g/dL (ref 13.0–17.1)
Immature Retic Fract: 2.3 % — ABNORMAL LOW (ref 3.00–10.60)
LYMPH#: 0.7 10*3/uL — AB (ref 0.9–3.3)
LYMPH%: 24.2 % (ref 14.0–49.0)
MCH: 35.1 pg — ABNORMAL HIGH (ref 27.2–33.4)
MCHC: 34.1 g/dL (ref 32.0–36.0)
MCV: 102.9 fL — ABNORMAL HIGH (ref 79.3–98.0)
MONO#: 0.6 10*3/uL (ref 0.1–0.9)
MONO%: 19.7 % — ABNORMAL HIGH (ref 0.0–14.0)
NEUT#: 1.5 10*3/uL (ref 1.5–6.5)
NEUT%: 52.7 % (ref 39.0–75.0)
Platelets: 49 10*3/uL — ABNORMAL LOW (ref 140–400)
RBC: 3.82 10*6/uL — AB (ref 4.20–5.82)
RDW: 15.2 % — AB (ref 11.0–14.6)
RETIC CT ABS: 80.98 10*3/uL (ref 34.80–93.90)
Retic %: 2.12 % — ABNORMAL HIGH (ref 0.80–1.80)
WBC: 2.9 10*3/uL — AB (ref 4.0–10.3)

## 2016-10-07 LAB — FERRITIN: Ferritin: 228 ng/ml (ref 22–316)

## 2016-10-07 NOTE — Progress Notes (Signed)
Jose [name] presents today for phlebotomy per MD orders. Phlebotomy procedure started at 1336 with 16 G in right AC and ended at 1346. 570 grams removed. Patient observed for 30 minutes after procedure without any incident. Patient tolerated procedure well. Snack/beverage provided IV needle removed intact.

## 2016-10-07 NOTE — Progress Notes (Signed)
Patient discharged home with no complaints. Ambulates well.  No signs of distress noted.

## 2016-10-07 NOTE — Patient Instructions (Signed)

## 2016-10-10 MED FILL — GENERLAC 10 GM/15 ML SOLN: 10 | 30 days supply | Qty: 4050 | Fill #0

## 2016-10-10 MED FILL — XIFAXAN 550 MG TABLET: 550 | 30 days supply | Qty: 60 | Fill #0

## 2016-10-15 MED FILL — OMEPRAZOLE DR 20 MG CAPSULE: 20 | 30 days supply | Qty: 120 | Fill #2

## 2016-10-27 MED FILL — TRIAMTERENE-HCTZ 37.5-25 MG: 37.5-25 | 30 days supply | Qty: 30 | Fill #1

## 2016-11-14 ENCOUNTER — Encounter: Payer: Self-pay | Admitting: Gastroenterology

## 2016-11-14 ENCOUNTER — Ambulatory Visit (INDEPENDENT_AMBULATORY_CARE_PROVIDER_SITE_OTHER): Payer: Medicaid Other | Admitting: Gastroenterology

## 2016-11-14 ENCOUNTER — Other Ambulatory Visit: Payer: Medicaid Other

## 2016-11-14 VITALS — BP 110/66 | HR 60 | Ht 67.0 in | Wt 200.0 lb

## 2016-11-14 DIAGNOSIS — K7031 Alcoholic cirrhosis of liver with ascites: Secondary | ICD-10-CM

## 2016-11-14 DIAGNOSIS — R7989 Other specified abnormal findings of blood chemistry: Secondary | ICD-10-CM

## 2016-11-14 DIAGNOSIS — K4090 Unilateral inguinal hernia, without obstruction or gangrene, not specified as recurrent: Secondary | ICD-10-CM

## 2016-11-14 DIAGNOSIS — R945 Abnormal results of liver function studies: Secondary | ICD-10-CM

## 2016-11-14 DIAGNOSIS — B182 Chronic viral hepatitis C: Secondary | ICD-10-CM

## 2016-11-14 DIAGNOSIS — K7290 Hepatic failure, unspecified without coma: Secondary | ICD-10-CM

## 2016-11-14 DIAGNOSIS — K7682 Hepatic encephalopathy: Secondary | ICD-10-CM

## 2016-11-14 MED ORDER — FUROSEMIDE 40 MG PO TABS: 40.0000 mg | ORAL_TABLET | Freq: Every day | ORAL | 2 refills | Status: DC

## 2016-11-14 MED ORDER — SPIRONOLACTONE 100 MG PO TABS
100.0000 mg | ORAL_TABLET | Freq: Every day | ORAL | 2 refills | Status: DC
Start: 2016-11-14 — End: 2017-03-02

## 2016-11-14 MED ORDER — RIFAXIMIN 550 MG PO TABS: 550.0000 mg | ORAL_TABLET | Freq: Two times a day (BID) | ORAL | 6 refills | Status: DC

## 2016-11-14 MED ORDER — RIFAXIMIN 550 MG PO TABS: 550.0000 mg | ORAL_TABLET | Freq: Two times a day (BID) | ORAL | 6 refills | Status: AC

## 2016-11-14 MED FILL — FUROSEMIDE 40 MG TABLET: 40 | 30 days supply | Qty: 30 | Fill #0

## 2016-11-14 MED FILL — SPIRONOLACTONE 100 MG TABS: 100 | 30 days supply | Qty: 30 | Fill #0

## 2016-11-14 NOTE — Patient Instructions (Addendum)
We have sent the following medications to your pharmacy for you to pick up at your convenience: Spironolactone Furosemide   Xifaxan- this has been sent to [facility] for prior authorization.   Discontinue taking Triamterene/HCTZ  We have sent a referral to Infectious Disease.  Please call our office if you haven't heard from them in 1 to 2 weeks.  Your physician has requested that you go to the basement for lab work before leaving today.  Normal BMI (Body Mass Index- based on height and weight) is between 19 and 25. Your BMI today is Body mass index is 31.32 kg/m. Marland Kitchen Please consider follow up  regarding your BMI with your Primary Care Provider.  Thank you for choosing [facility]  Dr Wilfrid Lund III

## 2016-11-14 NOTE — Progress Notes (Signed)
[facility] Progress Note  Chief Complaint: Cirrhosis and hepatitis C  Subjective  History:  This is clinic follow-up for 61 year old man I last saw during an April hospital stay. He saw our PA Kelso in May. Jose [name] has end-stage liver disease were combination of alcohol abuse and untreated chronic hepatitis C infection. At his last visit he reported having been abstinent from alcohol, now says he has had "a few drinks". He had one as recently as last weekend at his aunt's birthday party. I strongly advised him to be completely abstinent from alcohol going forward. He has lately had some leg swelling, he denies change in abdominal girth. He feels that the rifaximin has helped him a lot, and that his thinking is clear. He is still bothered by a right inguinal hernia and umbilical hernia and wondered if surgery was appropriate.  ROS: Remainder of systems are negative except as above in history.  The patient's Past Medical, Family and Social History were reviewed and are on file in the EMR.  Objective:  Med list reviewed  Vital signs in last 24 hrs: Vitals:   11/14/16 1340  BP: 110/66  Pulse: 60    Physical Exam    HEENT: sclera anicteric, oral mucosa moist without lesions  Neck: supple, no thyromegaly, JVD or lymphadenopathy  Cardiac: RRR without murmurs, S1S2 heard, 2+ peripheral edema  Pulm: clear to auscultation bilaterally, normal RR and effort noted  Abdomen: soft, No tenderness, with active bowel sounds. No guarding or palpable hepatosplenomegaly. He has a small reducible umbilical hernia and a right inguinal hernia  Skin; warm and dry, no jaundice or rash Neuro: No asterixis, speech fluent, steady gait Recent Labs:  CBC Latest Ref Rng & Units 10/07/2016 09/10/2016 08/15/2016  WBC 4.0 - 10.3 10e3/uL 2.9(L) 2.8(L) 3.8(L)  Hemoglobin 13.0 - 17.1 g/dL 13.4 12.7(L) 13.5  Hematocrit 38.4 - 49.9 % 39.3 37.3(L) 38.6(L)  Platelets 140 - 400 10e3/uL 49(L) 50.0  Repeated and verified X2.(L) 62(L)   CMP Latest Ref Rng & Units 09/10/2016 08/15/2016 08/14/2016  Glucose 70 - 99 mg/dL 144(H) 112(H) 109(H)  BUN 6 - 23 mg/dL 7 24(H) 27(H)  Creatinine 0.40 - 1.50 mg/dL 1.04 1.32(H) 1.67(H)  Sodium 135 - 145 mEq/L 139 129(L) 131(L)  Potassium 3.5 - 5.1 mEq/L 4.1 4.7 4.8  Chloride 96 - 112 mEq/L 111 102 105  CO2 19 - 32 mEq/L 27 17(L) 18(L)  Calcium 8.4 - 10.5 mg/dL 7.7(L) 8.8(L) 8.5(L)  Total Protein 6.0 - 8.3 g/dL 6.1 - 7.5  Total Bilirubin 0.2 - 1.2 mg/dL 1.7(H) - 1.8(H)  Alkaline Phos 39 - 117 U/L 249(H) - 191(H)  AST 0 - 37 U/L 73(H) - 96(H)  ALT 0 - 53 U/L 40 - 64(H)   INR 1.4   On 09/10/16 HBVcore Ab pos,  Sag neg, no Sab tested Last AFP stable at 27 on 08/15/16  He has a chronic masslike appearance of the liver seen on prior imaging that 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 See my hospital consult note for details.  @ASSESSMENTPLANBEGIN @ Assessment: Encounter Diagnoses  Name Primary?  . Chronic hepatitis C without hepatic coma ([place]) Yes  . Alcoholic cirrhosis of liver with ascites ([place])   . Hepatic encephalopathy ([place])   . Elevated LFTs   . Right inguinal hernia    His encephalopathy is now under good control. He had minimal ascites on last imaging, and has peripheral edema. Jose [name] is up-to-date on hepatoma screening  and I think he might be a reasonably good candidate for hepatitis C treatment to hopefully clear his virus and decrease the chance of future decompensation. His long-term prognosis is poor, and he absolutely needs to stop drinking alcohol.  Plan:  I've referred into infectious disease, and I ordered a genotype and viral load Hepatitis B surface antibody to see if he needs to be vaccinated. Discontinue his current triamterene/HCTZ and start spironolactone 100 mg once daily and furosemide 40 mg once daily See me in 8 weeks   Total time 30 minutes, over half spent in counseling and coordination of  care.   [name]

## 2016-11-15 LAB — HEPATITIS B SURFACE ANTIBODY,QUALITATIVE: HEP B S AB: BORDERLINE — AB

## 2016-11-17 ENCOUNTER — Telehealth: Payer: Self-pay

## 2016-11-17 ENCOUNTER — Telehealth: Payer: Self-pay | Admitting: Gastroenterology

## 2016-11-17 NOTE — Telephone Encounter (Signed)
Pt aware we are working on the prior auth with [facility] for the [place]. We have some samples for a weeks supply. Pt aware to pick up in the office.

## 2016-11-17 NOTE — Telephone Encounter (Signed)
Gae Bon with [facility] is working on the prior [place]. Records have been faxed.

## 2016-11-17 NOTE — Telephone Encounter (Signed)
Referral sent to infectious disease for Hep C. Will await appointment information.

## 2016-11-19 ENCOUNTER — Emergency Department (HOSPITAL_COMMUNITY)
Admission: EM | Admit: 2016-11-19 | Discharge: 2016-11-19 | Disposition: A | Discharge status: Home / Self Care | Payer: No Typology Code available for payment source | Attending: Emergency Medicine | Admitting: Emergency Medicine

## 2016-11-19 ENCOUNTER — Emergency Department (HOSPITAL_COMMUNITY): Payer: No Typology Code available for payment source

## 2016-11-19 ENCOUNTER — Encounter (HOSPITAL_COMMUNITY): Payer: Self-pay | Admitting: Emergency Medicine

## 2016-11-19 DIAGNOSIS — Z79899 Other long term (current) drug therapy: Secondary | ICD-10-CM | POA: Insufficient documentation

## 2016-11-19 DIAGNOSIS — Y92410 Unspecified street and highway as the place of occurrence of the external cause: Secondary | ICD-10-CM | POA: Insufficient documentation

## 2016-11-19 DIAGNOSIS — Y999 Unspecified external cause status: Secondary | ICD-10-CM | POA: Diagnosis not present

## 2016-11-19 DIAGNOSIS — F17200 Nicotine dependence, unspecified, uncomplicated: Secondary | ICD-10-CM | POA: Insufficient documentation

## 2016-11-19 DIAGNOSIS — T148XXA Other injury of unspecified body region, initial encounter: Secondary | ICD-10-CM | POA: Insufficient documentation

## 2016-11-19 DIAGNOSIS — I10 Essential (primary) hypertension: Secondary | ICD-10-CM | POA: Insufficient documentation

## 2016-11-19 DIAGNOSIS — Y9389 Activity, other specified: Secondary | ICD-10-CM | POA: Insufficient documentation

## 2016-11-19 DIAGNOSIS — M25512 Pain in left shoulder: Secondary | ICD-10-CM

## 2016-11-19 LAB — HEPATITIS C GENOTYPE

## 2016-11-19 NOTE — Discharge Instructions (Signed)
As we discussed, you will feel sore for the next several days.  Take ibuprofen as directed to help with the pain.  You can apply ice to the affected areas for the first 2 days and then apply heat.  Return the emergency Department for any worsening neck pain, back pain, difficulty walking, numbness/weakness of her arms or legs, chest pain, difficulty breathing, vomiting or any other worsening or concerning symptoms.

## 2016-11-19 NOTE — ED Notes (Signed)
Bed: WTR9 Expected date:  Expected time:  Means of arrival:  Comments: 
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
## 2016-11-20 LAB — HEPATITIS C RNA QUANTITATIVE
HCV QUANT LOG: 4.26 {Log_IU}/mL — AB
HCV QUANT: 18200 [IU]/mL — AB

## 2016-11-21 ENCOUNTER — Telehealth: Payer: Self-pay | Admitting: Gastroenterology

## 2016-11-21 NOTE — Telephone Encounter (Signed)
Gae Bon at [facility] is still working on the [place]. She needs to communicate with the patient and has not ben able to reach him yet. Pt aware to expect the call. Also I did give him the contact info to call [facility].

## 2016-11-21 NOTE — Telephone Encounter (Signed)
Called and spoke to the pt. Let him know its really important that he talk to [facility]. He states understanding. I gave him the contact info to [facility] and advised he call. I also let Gae Bon who is working on his prior auth I have made him aware to expect the call.

## 2016-11-26 NOTE — Telephone Encounter (Signed)
Patient has an appointment for 12-10-2016 @ 11 am to see Dr [name].

## 2016-11-26 NOTE — Telephone Encounter (Signed)
Called [facility] to check on prior auth. They got in touch with the patient and submitted everything on 11-25-2016.

## 2016-12-09 NOTE — Telephone Encounter (Signed)
Contacted [facility] to follow up on Xifaxan. Rx was approved for $ 3.00 a month. Pt picked up Rx per [facility] pharmacy.

## 2016-12-10 ENCOUNTER — Encounter: Payer: Self-pay | Admitting: Internal Medicine

## 2016-12-10 ENCOUNTER — Ambulatory Visit (INDEPENDENT_AMBULATORY_CARE_PROVIDER_SITE_OTHER): Payer: Medicaid Other | Admitting: Internal Medicine

## 2016-12-10 VITALS — BP 125/77 | HR 62 | Temp 98.4°F | Ht 66.0 in | Wt 203.0 lb

## 2016-12-10 DIAGNOSIS — B182 Chronic viral hepatitis C: Secondary | ICD-10-CM | POA: Diagnosis present

## 2016-12-10 LAB — CBC WITH DIFFERENTIAL/PLATELET
BASOS PCT: 1 %
Basophils Absolute: 31 cells/uL (ref 0–200)
EOS ABS: 93 {cells}/uL (ref 15–500)
Eosinophils Relative: 3 %
HCT: 39 % (ref 38.5–50.0)
Hemoglobin: 13.4 g/dL (ref 13.2–17.1)
LYMPHS PCT: 26 %
Lymphs Abs: 806 cells/uL — ABNORMAL LOW (ref 850–3900)
MCH: 34.9 pg — AB (ref 27.0–33.0)
MCHC: 34.4 g/dL (ref 32.0–36.0)
MCV: 101.6 fL — AB (ref 80.0–100.0)
MONOS PCT: 25 %
MPV: 12.1 fL (ref 7.5–12.5)
Monocytes Absolute: 775 cells/uL (ref 200–950)
NEUTROS PCT: 45 %
Neutro Abs: 1395 cells/uL — ABNORMAL LOW (ref 1500–7800)
Platelets: 54 10*3/uL — ABNORMAL LOW (ref 140–400)
RBC: 3.84 MIL/uL — ABNORMAL LOW (ref 4.20–5.80)
RDW: 14.6 % (ref 11.0–15.0)
WBC: 3.1 10*3/uL — ABNORMAL LOW (ref 3.8–10.8)

## 2016-12-10 MED ORDER — RIBAVIRIN 200 MG PO CAPS: 600.0000 mg | ORAL_CAPSULE | Freq: Every day | ORAL | 2 refills | Status: DC

## 2016-12-10 MED ORDER — LEDIPASVIR-SOFOSBUVIR 90-400 MG PO TABS: 1.0000 | ORAL_TABLET | Freq: Every day | ORAL | 2 refills | Status: DC

## 2016-12-10 MED FILL — GENERLAC 10 GM/15 ML SOLN: 10 | 30 days supply | Qty: 4050 | Fill #1

## 2016-12-10 MED FILL — FUROSEMIDE 40 MG TABLET: 40 | 30 days supply | Qty: 30 | Fill #1

## 2016-12-10 MED FILL — SPIRONOLACTONE 100 MG TABS: 100 | 30 days supply | Qty: 30 | Fill #1

## 2016-12-10 NOTE — Progress Notes (Addendum)
[facility] for Infectious Disease   CC: consideration for treatment for chronic hepatitis C  HPI:  +Jose [name] is a 61 y.o. male who presents for initial evaluation and management of chronic hepatitis C.  Patient tested positive earlier this year due to risk factor screening. Hepatitis C-associated risk factors present are: IV drug abuse (details: over 10 years ago). Patient denies sexual contact with person with liver disease, tattoos. Patient has had other studies performed. Results: hepatitis C RNA by PCR, result: positive. Patient has not had prior treatment for Hepatitis C. Patient does have a past history of liver disease. Patient does not have a family history of liver disease. Patient does  have associated signs or symptoms related to liver disease.  Labs reviewed and confirm chronic hepatitis C with a positive viral load and genotype 1a infection.    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  He is followed for this by Dr. Loletha Carrow.       Patient does not have documented immunity to Hepatitis A. Patient does not have documented immunity to Hepatitis B.    Review of Systems:   Constitutional: negative for fatigue and malaise Gastrointestinal: negative for diarrhea Integument/breast: negative for rash All other systems reviewed and are negative       Past Medical History:  Diagnosis Date  . Alcohol abuse   . Alcohol dependence ([place])   . Alcoholism ([place]) 07/12/2015  . Ascites   . Cirrhosis ([place])   . Depression   . Hepatitis C   . Hypertension   . QT prolongation   . Shortness of breath    with exertion  . Thrombocytopenia ([place])     Prior to Admission medications   Medication Sig Start Date End Date Taking? Authorizing Provider  fluticasone (FLONASE) 50 MCG/ACT nasal spray Place 2 sprays into both nostrils daily.   Yes [provider]    furosemide (LASIX) 40 MG tablet Take 1 tablet (40 mg total) by mouth daily. 11/14/16  Yes Danis, Kirke Corin, MD  lactulose (CHRONULAC) 10 GM/15ML solution Take 45 mLs (30 g total) by mouth 4 (four) times daily. Patient taking differently: Take 30 g by mouth 3 (three) times daily.  08/06/16  Yes Reyne Dumas, MD  omeprazole (PRILOSEC) 20 MG capsule Take 2 capsules (40 mg total) by mouth 2 (two) times daily before a meal. 07/18/16  Yes Eugenie Filler, MD  spironolactone (ALDACTONE) 100 MG tablet Take 1 tablet (100 mg total) by mouth daily. 11/14/16  Yes Danis, Kirke Corin, MD  rifaximin (XIFAXAN) 550 MG TABS tablet Take 1 tablet (550 mg total) by mouth 2 (two) times daily. 11/14/16   Doran Stabler, MD    No Known Allergies  Social History  Substance Use Topics  . Smoking status: Current Some Day Smoker    Types: Cigarettes  . Smokeless tobacco: Never Used  . Alcohol use Yes     Comment: A couple beers every few days     Family History  Problem Relation Age of Onset  . Breast cancer Other   . Diabetes Other   . Hypertension Other   . Breast cancer Mother   . Hypertension Mother   . Diabetes Father   . Hypertension Sister   . Heart disease Sister   . Hypertension Paternal Grandmother   . Colon cancer Neg Hx  Objective:  Constitutional: in no apparent distress and alert,  Vitals:   12/10/16 1052  BP: 125/77  Pulse: 62  Temp: 98.4 F (36.9 C)   Eyes: anicteric Cardiovascular: Cor RRR Respiratory: CTA B; normal respiratory effort Gastrointestinal: Bowel sounds are normal Musculoskeletal: no pedal edema noted Skin: negatives: no rash; no porphyria cutanea tarda Lymphatic: no cervical lymphadenopathy   Laboratory Genotype:  Lab Results  Component Value Date   HCVGENOTYPE 1a 11/14/2016   HCV viral load:  Lab Results  Component Value Date   HCVQUANT 18,200 (H) 11/14/2016   Lab Results  Component Value Date   WBC 2.9 (L) 10/07/2016   HGB 13.4  10/07/2016   HCT 39.3 10/07/2016   MCV 102.9 (H) 10/07/2016   PLT 49 (L) 10/07/2016    Lab Results  Component Value Date   CREATININE 1.04 09/10/2016   BUN 7 09/10/2016   NA 139 09/10/2016   K 4.1 09/10/2016   CL 111 09/10/2016   CO2 27 09/10/2016    Lab Results  Component Value Date   ALT 40 09/10/2016   AST 73 (H) 09/10/2016   ALKPHOS 249 (H) 09/10/2016     Labs and history reviewed and show CHILD-PUGH C  5-6 points: Child class A 7-9 points: Child class B 10-15 points: Child class C  Lab Results  Component Value Date   INR 1.4 (H) 09/10/2016   BILITOT 1.7 (H) 09/10/2016   ALBUMIN 2.6 (L) 09/10/2016     Assessment: New Patient with Chronic Hepatitis C genotype 1a, untreated.  I discussed with the patient the lab findings that confirm chronic hepatitis C as well as the natural history and progression of disease including about 30% of people who develop cirrhosis of the liver if left untreated and once cirrhosis is established there is a 2-7% risk per year of liver cancer and liver failure.  I discussed the importance of treatment and benefits in reducing the risk, even if significant liver fibrosis exists.   Plan: 1) Patient counseled extensively on limiting acetaminophen to no more than 2 grams daily, avoidance of alcohol. 2) Transmission discussed with patient including sexual transmission, sharing razors and toothbrush.   3) Will need referral to gastroenterology if concern for cirrhosis 4) Will need referral for substance abuse counseling: No.; Further work up to include urine drug screen  No. 5) Will prescribe Harvoni for 12 weeks with ribavirin 600 mg daily and titrate up 6) Hepatitis A vaccine will check titers 7) Hepatitis B vaccine No. 8) Pneumovax vaccine already given 9) will follow up after starting medication

## 2016-12-10 NOTE — Patient Instructions (Signed)
Date 12/10/16  Dear Mr. Biederman, As discussed in the [facility], your hepatitis C therapy will include the following medications:          Harvoni 90mg /400mg  tablet:           Take 1 tablet by mouth once daily   Please note that ALL MEDICATIONS WILL START ON THE SAME DATE for a total of 12 weeks. ---------------------------------------------------------------- Your HCV Treatment Start Date: TBA   Your HCV genotype:  1a    Liver Fibrosis: cirrhosis    ---------------------------------------------------------------- YOUR PHARMACY CONTACT:   [facility] [address] [place], [place] [address] Phone: [phone] Hours: Monday to Friday 7:30 am to 6:00 pm   Please always contact your pharmacy at least 3-4 business days before you run out of medications to ensure your next month's medication is ready or 1 week prior to running out if you receive it by mail.  Remember, each prescription is for 28 days. ---------------------------------------------------------------- GENERAL NOTES REGARDING YOUR HEPATITIS C MEDICATION:  SOFOSBUVIR/LEDIPASVIR (HARVONI): - Harvoni tablet is taken daily with OR without food. - The tablets are orange. - The tablets should be stored at room temperature.  - Acid reducing agents such as H2 blockers (ie. Pepcid (famotidine), Zantac (ranitidine), Tagamet (cimetidine), Axid (nizatidine) and proton pump inhibitors (ie. Prilosec (omeprazole), Protonix (pantoprazole), Nexium (esomeprazole), or Aciphex (rabeprazole)) can decrease effectiveness of Harvoni. Do not take until you have discussed with a health care provider.    -Antacids that contain magnesium and/or aluminum hydroxide (ie. Milk of Magensia, Rolaids, Gaviscon, Maalox, Mylanta, an dArthritis Pain Formula)can reduce absorption of Harvoni, so take them at least 4 hours before or after Harvoni.  -Calcium carbonate (calcium supplements or antacids such as Tums, Caltrate, Os-Cal)needs to be  taken at least 4 hours hours before or after Harvoni.  -St. John's wort or any products that contain St. John's wort like some herbal supplements  Please inform the office prior to starting any of these medications.  - The common side effects associated with Harvoni include:      1. Fatigue      2. Headache      3. Nausea      4. Diarrhea      5. Insomnia  Please note that this only lists the most common side effects and is NOT a comprehensive list of the potential side effects of these medications. For more information, please review the drug information sheets that come with your medication package from the pharmacy.  ---------------------------------------------------------------- GENERAL HELPFUL HINTS ON HCV THERAPY: 1. Stay well-hydrated. 2. Notify the [facility] Clinic of any changes in your other over-the-counter/herbal or prescription medications. 3. If you miss a dose of your medication, take the missed dose as soon as you remember. Return to your regular time/dose schedule the next day.  4.  Do not stop taking your medications without first talking with your healthcare provider. 5.  You may take Tylenol (acetaminophen), as long as the dose is less than 2000 mg (OR no more than 4 tablets of the Tylenol Extra Strengths 500mg  tablet) in 24 hours. 6.  You will see our pharmacist-specialist within the first 2 weeks of starting your medication to monitor for any possible side effects. 7.  You will have labs once during treatment, after soon after treatment completion and one final lab 6 months after treatment completion to verify the virus is out of your system.  Scharlene Gloss, [place] [facility] [place], [place]  [id number] [phone]

## 2016-12-10 NOTE — Addendum Note (Signed)
Addended by: Thayer Headings on: 12/10/2016 12:11 PM   Modules accepted: Orders

## 2016-12-11 LAB — COMPLETE METABOLIC PANEL WITH GFR
ALT: 36 U/L (ref 9–46)
AST: 70 U/L — ABNORMAL HIGH (ref 10–35)
Albumin: 2.7 g/dL — ABNORMAL LOW (ref 3.6–5.1)
Alkaline Phosphatase: 259 U/L — ABNORMAL HIGH (ref 40–115)
BUN: 9 mg/dL (ref 7–25)
CO2: 21 mmol/L (ref 20–32)
Calcium: 8.5 mg/dL — ABNORMAL LOW (ref 8.6–10.3)
Chloride: 110 mmol/L (ref 98–110)
Creat: 1.04 mg/dL (ref 0.70–1.25)
GFR, Est African American: >89 mL/min (ref >=60)
GFR, Est Non African American: 78 mL/min (ref >=60)
Glucose, Bld: 85 mg/dL (ref 65–99)
Potassium: 4.1 mmol/L (ref 3.5–5.3)
Sodium: 139 mmol/L (ref 135–146)
Total Bilirubin: 1.7 mg/dL — ABNORMAL HIGH (ref 0.2–1.2)
Total Protein: 6.4 g/dL (ref 6.1–8.1)

## 2016-12-11 LAB — HEPATITIS B SURFACE ANTIGEN: Hepatitis B Surface Ag: NONREACTIVE

## 2016-12-11 LAB — HIV ANTIBODY (ROUTINE TESTING W REFLEX): HIV 1&2 Ab, 4th Generation: NONREACTIVE

## 2016-12-18 ENCOUNTER — Other Ambulatory Visit: Payer: Self-pay | Admitting: Pharmacist Clinician (PhC)/ Clinical Pharmacy Specialist

## 2016-12-18 ENCOUNTER — Encounter: Payer: Self-pay | Admitting: Pharmacy Technician

## 2016-12-18 MED ORDER — RIBAVIRIN 200 MG PO TABS: 600.0000 mg | ORAL_TABLET | Freq: Every day | ORAL | 2 refills | Status: DC

## 2016-12-18 MED ORDER — SOFOSBUVIR-VELPATASVIR 400-100 MG PO TABS: 1.0000 | ORAL_TABLET | Freq: Every day | ORAL | 2 refills | Status: DC

## 2016-12-18 MED FILL — EPCLUSA 400 MG-100 MG TAB: 400-100 | 28 days supply | Qty: 28 | Fill #0

## 2016-12-18 MED FILL — RIBAVIRIN 200 MG TABLET: 200 | 28 days supply | Qty: 84 | Fill #0

## 2016-12-22 ENCOUNTER — Ambulatory Visit: Payer: Medicaid Other | Admitting: Pharmacist

## 2016-12-22 DIAGNOSIS — K219 Gastro-esophageal reflux disease without esophagitis: Secondary | ICD-10-CM

## 2016-12-22 MED ORDER — OMEPRAZOLE 20 MG PO CPDR: 20.0000 mg | DELAYED_RELEASE_CAPSULE | Freq: Every day | ORAL | 11 refills | Status: DC

## 2016-12-22 MED FILL — OMEPRAZOLE 20 MG CAP: 20 | 30 days supply | Qty: 30 | Fill #0

## 2016-12-22 NOTE — Progress Notes (Addendum)
HPI: Jose [name] is a 61 y.o. male who presents to the [facility] clinic to initiate medications for his Hep C infection.  He has genotype 1a, F4 with CP class C, and will start Epclusa + ribavirin today 8/27.   Lab Results  Component Value Date   HCVGENOTYPE 1a 11/14/2016    Allergies: No Known Allergies  Past Medical History: Past Medical History:  Diagnosis Date  . Alcohol abuse   . Alcohol dependence ([place])   . Alcoholism ([place]) 07/12/2015  . Ascites   . Cirrhosis ([place])   . Depression   . Hepatitis C   . Hypertension   . QT prolongation   . Shortness of breath    with exertion  . Thrombocytopenia ([place])     Social History: Social History   Social History  . Marital status: Single    Spouse name: N/A  . Number of children: N/A  . Years of education: N/A   Social History Main Topics  . Smoking status: Current Some Day Smoker    Types: Cigarettes  . Smokeless tobacco: Never Used  . Alcohol use Yes     Comment: A couple beers every few days   . Drug use: No     Comment: last used 11/03/13  . 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 Not on file   Social History Narrative   ** Merged History Encounter **        Labs: Hep B S Ab (no units)  Date Value  11/14/2016 BORDERLINE (A)   Hepatitis B Surface Ag (no units)  Date Value  12/10/2016 NON-REACTIVE   HCV Ab (no units)  Date Value  04/26/2013 Reactive (A)    Lab Results  Component Value Date   HCVGENOTYPE 1a 11/14/2016    Hepatitis C RNA quantitative Latest Ref Rng & Units 11/14/2016  HCV Quantitative NOT DETECTED IU/mL 18,200(H)  HCV Quantitative Log NOT DETECTED Log IU/mL 4.26(H)    AST (U/L)  Date Value  12/10/2016 70 (H)  09/10/2016 73 (H)  08/14/2016 96 (H)  01/04/2016 98 (H)   ALT (U/L)  Date Value  12/10/2016 36  09/10/2016 40  08/14/2016 64 (H)  01/04/2016 52   INR  Date Value  09/10/2016 1.4 ratio (H)  08/05/2016  1.21  05/10/2016 1.30    CrCl: Estimated Creatinine Clearance: 80.2 mL/min (by C-G formula based on SCr of 1.04 mg/dL).  Fibrosis Score: F4   Child-Pugh Score: C  Previous Treatment Regimen: None  Assessment: Andren is here today to discuss his new medications for treating his Hepatitis C.  He is currently on lactulose, lasix, rifaximin, spironolactone, and omeprazole 40 mg BID.  We will start him on Epclusa with low dose ribavirin since he is child pugh class C.  We will attempt to titrate his ribavirin up during therapy. I discussed the medication with him and the need to decrease the dose of omperazole to 20 mg PO daily.  He is agreeable to this. I made a calendar showing him to take the Epclusa (1 tablet) with the ribavirin (3 tablets) together in the morning with breakfast.  I then told him to take only one pill once daily of the omeprazole at least 4 hours after taking the Epclusa, so I put that down for him to take that at 12 noon. He understood.  I sent in a new Rx for omeprazole to [facility] for  him to start taking. He will follow-up here in ~3 weeks.    Plans: - Epclusa daily + ribavirin 600 mg PO daily taken at 8am with food - Take omperazole 20 mg PO once daily at 12 noon - F/u with [place] again 9/19 at 230pm  Airyanna Dipalma L. Duvall Comes, PharmD, [place] [facility] 12/22/2016, 3:16 PM

## 2017-01-08 ENCOUNTER — Telehealth: Payer: Self-pay | Admitting: Hematology and Oncology

## 2017-01-08 ENCOUNTER — Encounter: Payer: Self-pay | Admitting: Hematology and Oncology

## 2017-01-08 ENCOUNTER — Ambulatory Visit (HOSPITAL_BASED_OUTPATIENT_CLINIC_OR_DEPARTMENT_OTHER): Payer: Self-pay | Admitting: Hematology and Oncology

## 2017-01-08 ENCOUNTER — Ambulatory Visit: Payer: Self-pay

## 2017-01-08 ENCOUNTER — Other Ambulatory Visit (HOSPITAL_BASED_OUTPATIENT_CLINIC_OR_DEPARTMENT_OTHER): Payer: Self-pay

## 2017-01-08 DIAGNOSIS — D61818 Other pancytopenia: Secondary | ICD-10-CM

## 2017-01-08 DIAGNOSIS — F1020 Alcohol dependence, uncomplicated: Secondary | ICD-10-CM

## 2017-01-08 DIAGNOSIS — K7031 Alcoholic cirrhosis of liver with ascites: Secondary | ICD-10-CM

## 2017-01-08 DIAGNOSIS — F17200 Nicotine dependence, unspecified, uncomplicated: Secondary | ICD-10-CM | POA: Insufficient documentation

## 2017-01-08 LAB — CBC & DIFF AND RETIC
BASO%: 0.5 % (ref 0.0–2.0)
Basophils Absolute: 0 10*3/uL (ref 0.0–0.1)
EOS ABS: 0.1 10*3/uL (ref 0.0–0.5)
EOS%: 2.4 % (ref 0.0–7.0)
HCT: 37.8 % — ABNORMAL LOW (ref 38.4–49.9)
HGB: 13.2 g/dL (ref 13.0–17.1)
IMMATURE RETIC FRACT: 5.6 % (ref 3.00–10.60)
LYMPH%: 23.3 % (ref 14.0–49.0)
MCH: 35.8 pg — ABNORMAL HIGH (ref 27.2–33.4)
MCHC: 34.9 g/dL (ref 32.0–36.0)
MCV: 102.4 fL — AB (ref 79.3–98.0)
MONO#: 0.6 10*3/uL (ref 0.1–0.9)
MONO%: 15.3 % — AB (ref 0.0–14.0)
NEUT%: 58.5 % (ref 39.0–75.0)
NEUTROS ABS: 2.2 10*3/uL (ref 1.5–6.5)
PLATELETS: 55 10*3/uL — AB (ref 140–400)
RBC: 3.69 10*6/uL — AB (ref 4.20–5.82)
RDW: 14.5 % (ref 11.0–14.6)
Retic %: 2.62 % — ABNORMAL HIGH (ref 0.80–1.80)
Retic Ct Abs: 96.68 10*3/uL — ABNORMAL HIGH (ref 34.80–93.90)
WBC: 3.8 10*3/uL — AB (ref 4.0–10.3)
lymph#: 0.9 10*3/uL (ref 0.9–3.3)
nRBC: 0 % (ref 0–0)

## 2017-01-08 LAB — FERRITIN: FERRITIN: 199 ng/mL (ref 22–316)

## 2017-01-08 NOTE — Assessment & Plan Note (Signed)
We discussed importance of him staying abstinent

## 2017-01-08 NOTE — Progress Notes (Unsigned)
Per Dr Alvy Bimler - order given

## 2017-01-08 NOTE — Assessment & Plan Note (Signed)
The cause of pancytopenia is likely due to alcoholic liver cirrhosis, alcoholism as well as liver disease I will observe only for pancytopenia He does not need blood transfusion

## 2017-01-08 NOTE — Assessment & Plan Note (Signed)
I spent some time counseling the patient the importance of tobacco cessation. We discussed common strategies including nicotine patches, Tobacco Quit-line, and other nicotine replacement products to assist in hiseffort to quit  he appears motivated to quit on his own  

## 2017-01-08 NOTE — Telephone Encounter (Signed)
Gave avs and calendar for march 2019 

## 2017-01-08 NOTE — Progress Notes (Signed)
[facility] OFFICE PROGRESS NOTE  [name], MD SUMMARY OF HEMATOLOGIC HISTORY:  Jose [name] is here because of thrombocytopenia.  He was found to have abnormal CBC from recent blood work. This patient have chronic untreated hepatitis C, liver disease with cirrhosis and chronic alcoholism. He was recently discharged from the hospital with acute encephalopathy which improved upon supportive care management. He denies recent bruising/bleeding, such as spontaneous epistaxis, hematuria, melena or hematochezia The patient had poor social circumstances. He lives in a group home/facility. He continues to drink on a regular basis, last alcohol intake was over a day ago. He has not been treated for hepatitis C, presumably due to noncompliance and chronic alcoholism. He has chronic right lower abdominal pain from abdominal hernia but that was not surgically repaired due to chronic thrombocytopenia In May, he had extensive workup including liver biopsy which excluded hepatoma Starting September 2017, he had regular phlebotomy every 3 weeks Starting March 2018, the frequency of phlebotomy is reduced to every 3 months due to  reduced ferritin level INTERVAL HISTORY: Jose [name] 61 y.o. male returns for further follow-up. He is following up with liver specialist and infectious disease we will plan to start treatment for hepatitis C soon He continues to smoke and drink alcohol The patient denies any recent signs or symptoms of bleeding such as spontaneous epistaxis, hematuria or hematochezia. He denies recent infection He tolerated phlebotomy well.  I have reviewed the past medical history, past surgical history, social history and family history with the patient and they are unchanged from previous note.  ALLERGIES:  has No Known Allergies.  MEDICATIONS:  Current Outpatient Prescriptions  Medication Sig Dispense Refill  . fluticasone (FLONASE) 50 MCG/ACT nasal spray  Place 2 sprays into both nostrils daily.    . furosemide (LASIX) 40 MG tablet Take 1 tablet (40 mg total) by mouth daily. 30 tablet 2  . lactulose (CHRONULAC) 10 GM/15ML solution Take 45 mLs (30 g total) by mouth 4 (four) times daily. (Patient taking differently: Take 30 g by mouth 3 (three) times daily. ) 500 mL 2  . omeprazole (PRILOSEC) 20 MG capsule Take 1 capsule (20 mg total) by mouth at bedtime. 30 capsule 11  . ribavirin (COPEGUS) 200 MG tablet Take 3 tablets (600 mg total) by mouth daily with breakfast. 84 tablet 2  . rifaximin (XIFAXAN) 550 MG TABS tablet Take 1 tablet (550 mg total) by mouth 2 (two) times daily. 60 tablet 6  . Sofosbuvir-Velpatasvir (EPCLUSA) 400-100 MG TABS Take 1 tablet by mouth daily with breakfast. 28 tablet 2  . spironolactone (ALDACTONE) 100 MG tablet Take 1 tablet (100 mg total) by mouth daily. 30 tablet 2   No current facility-administered medications for this visit.      REVIEW OF SYSTEMS:   Constitutional: Denies fevers, chills or night sweats Eyes: Denies blurriness of vision Ears, nose, mouth, throat, and face: Denies mucositis or sore throat Respiratory: Denies cough, dyspnea or wheezes Cardiovascular: Denies palpitation, chest discomfort or lower extremity swelling Gastrointestinal:  Denies nausea, heartburn or change in bowel habits Skin: Denies abnormal skin rashes Lymphatics: Denies new lymphadenopathy or easy bruising Neurological:Denies numbness, tingling or new weaknesses Behavioral/Psych: Mood is stable, no new changes  All other systems were reviewed with the patient and are negative.  PHYSICAL EXAMINATION: ECOG PERFORMANCE STATUS: 1 - Symptomatic but completely ambulatory  Vitals:   01/08/17 1424  BP: 123/69  Pulse: (!) 55  Resp: 20  Temp: 98.5 F (36.9  C)  SpO2: 99%   Filed Weights   01/08/17 1424  Weight: 201 lb 4.8 oz (91.3 kg)    GENERAL:alert, no distress and comfortable SKIN: skin color, texture, turgor are normal, no  rashes or significant lesions EYES: normal, Conjunctiva are pink and non-injected, sclera clear OROPHARYNX:no exudate, no erythema and lips, buccal mucosa, and tongue normal  NECK: supple, thyroid normal size, non-tender, without nodularity LYMPH:  no palpable lymphadenopathy in the cervical, axillary or inguinal LUNGS: clear to auscultation and percussion with normal breathing effort HEART: regular rate & rhythm and no murmurs and no lower extremity edema ABDOMEN:abdomen soft, distended with ascites. Musculoskeletal:no cyanosis of digits and no clubbing  NEURO: alert & oriented x 3 with fluent speech, no focal motor/sensory deficits  LABORATORY DATA:  I have reviewed the data as listed     Component Value Date/Time   NA 139 12/10/2016 1132   NA 137 01/04/2016 0809   K 4.1 12/10/2016 1132   K 4.5 01/04/2016 0809   CL 110 12/10/2016 1132   CO2 21 12/10/2016 1132   CO2 18 (L) 01/04/2016 0809   GLUCOSE 85 12/10/2016 1132   GLUCOSE 109 01/04/2016 0809   BUN 9 12/10/2016 1132   BUN 14.5 01/04/2016 0809   CREATININE 1.04 12/10/2016 1132   CREATININE 1.0 01/04/2016 0809   CALCIUM 8.5 (L) 12/10/2016 1132   CALCIUM 8.3 (L) 01/04/2016 0809   PROT 6.4 12/10/2016 1132   PROT 6.8 01/04/2016 0809   ALBUMIN 2.7 (L) 12/10/2016 1132   ALBUMIN 2.3 (L) 01/04/2016 0809   AST 70 (H) 12/10/2016 1132   AST 98 (H) 01/04/2016 0809   ALT 36 12/10/2016 1132   ALT 52 01/04/2016 0809   ALKPHOS 259 (H) 12/10/2016 1132   ALKPHOS 231 (H) 01/04/2016 0809   BILITOT 1.7 (H) 12/10/2016 1132   BILITOT 2.10 (H) 01/04/2016 0809   GFRNONAA 78 12/10/2016 1132   GFRAA >89 12/10/2016 1132    No results found for: SPEP, UPEP  Lab Results  Component Value Date   WBC 3.8 (L) 01/08/2017   NEUTROABS 2.2 01/08/2017   HGB 13.2 01/08/2017   HCT 37.8 (L) 01/08/2017   MCV 102.4 (H) 01/08/2017   PLT 55 (L) 01/08/2017      Chemistry      Component Value Date/Time   NA 139 12/10/2016 1132   NA 137 01/04/2016  0809   K 4.1 12/10/2016 1132   K 4.5 01/04/2016 0809   CL 110 12/10/2016 1132   CO2 21 12/10/2016 1132   CO2 18 (L) 01/04/2016 0809   BUN 9 12/10/2016 1132   BUN 14.5 01/04/2016 0809   CREATININE 1.04 12/10/2016 1132   CREATININE 1.0 01/04/2016 0809   GLU 117 09/12/2015      Component Value Date/Time   CALCIUM 8.5 (L) 12/10/2016 1132   CALCIUM 8.3 (L) 01/04/2016 0809   ALKPHOS 259 (H) 12/10/2016 1132   ALKPHOS 231 (H) 01/04/2016 0809   AST 70 (H) 12/10/2016 1132   AST 98 (H) 01/04/2016 0809   ALT 36 12/10/2016 1132   ALT 52 01/04/2016 0809   BILITOT 1.7 (H) 12/10/2016 1132   BILITOT 2.10 (H) 01/04/2016 0809       ASSESSMENT & PLAN:  Hemochromatosis The patient has signs of iron overload and  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 He appears to be responding well to phlebotomy. After today's phlebotomy, I will see him back in 6 months and will hold further phlebotomy in the future unless ferritin is  started to trend up greater than 832   Alcoholic cirrhosis of liver with ascites ([place]) The patient is asymptomatic. I recommend close follow-up with liver specialist for close monitoring with plan to start [place] for hepatitis C  Alcoholism ([place]) We discussed importance of him staying abstinent  Smoker I spent some time counseling the patient the importance of tobacco cessation. We discussed common strategies including nicotine patches, Tobacco Quit-line, and other nicotine replacement products to assist in hiseffort to quit  he appears motivated to quit on his own.   Pancytopenia, acquired ([place]) The cause of pancytopenia is likely due to alcoholic liver cirrhosis, alcoholism as well as liver disease I will observe only for pancytopenia He does not need blood transfusion   No orders of the defined types were placed in this encounter.   All questions were answered. The patient knows to call the clinic with any problems, questions or concerns. No barriers to learning  was detected.  I spent 15 minutes counseling the patient face to face. The total time spent in the appointment was 20 minutes and more than 50% was on counseling.     Heath Lark, MD 9/13/20182:58 PM

## 2017-01-08 NOTE — Assessment & Plan Note (Signed)
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 He appears to be responding well to phlebotomy. After today's phlebotomy, I will see him back in 6 months and will hold further phlebotomy in the future unless ferritin is started to trend up greater than 300

## 2017-01-08 NOTE — Assessment & Plan Note (Signed)
The patient is asymptomatic. I recommend close follow-up with liver specialist for close monitoring with plan to start [place] for hepatitis C

## 2017-01-12 MED FILL — FUROSEMIDE 40 MG TABLET: 40 | 30 days supply | Qty: 30 | Fill #2

## 2017-01-12 MED FILL — GENERLAC 10 GM/15 ML SOLN: 10 | 30 days supply | Qty: 4050 | Fill #2

## 2017-01-12 MED FILL — SPIRONOLACTONE 100 MG TABS: 100 | 30 days supply | Qty: 30 | Fill #2

## 2017-01-13 MED FILL — OMEPRAZOLE 20 MG CAP: 20 | 30 days supply | Qty: 30 | Fill #1

## 2017-01-14 ENCOUNTER — Ambulatory Visit (INDEPENDENT_AMBULATORY_CARE_PROVIDER_SITE_OTHER): Payer: Medicaid Other | Admitting: Pharmacist

## 2017-01-14 DIAGNOSIS — Z23 Encounter for immunization: Secondary | ICD-10-CM | POA: Diagnosis not present

## 2017-01-14 DIAGNOSIS — B182 Chronic viral hepatitis C: Secondary | ICD-10-CM | POA: Diagnosis not present

## 2017-01-14 MED FILL — RIBAVIRIN 200 MG TABLET: 200 | 28 days supply | Qty: 84 | Fill #0

## 2017-01-14 MED FILL — EPCLUSA 400 MG-100 MG TAB: 400-100 | 28 days supply | Qty: 28 | Fill #1

## 2017-01-14 NOTE — Progress Notes (Signed)
HPI: Jose [name] is a 61 y.o. male who presents to the [facility] for follow-up of his Hep C infection. He has genotype 1a, F4 with CP class C, and started Epclusa + low dose ribavirin on 8/27.  Lab Results  Component Value Date   HCVGENOTYPE 1a 11/14/2016    Allergies: No Known Allergies  Past Medical History: Past Medical History:  Diagnosis Date  . Alcohol abuse   . Alcohol dependence ([place])   . Alcoholism ([place]) 07/12/2015  . Ascites   . Cirrhosis ([place])   . Depression   . Hepatitis C   . Hypertension   . QT prolongation   . Shortness of breath    with exertion  . Thrombocytopenia ([place])     Social History: Social History   Social History  . Marital status: Single    Spouse name: N/A  . Number of children: N/A  . Years of education: N/A   Social History Main Topics  . Smoking status: Current Some Day Smoker    Types: Cigarettes  . Smokeless tobacco: Never Used  . Alcohol use Yes     Comment: A couple beers every few days   . Drug use: No     Comment: last used 11/03/13  . 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 Not on file   Social History Narrative   ** Merged History Encounter **        Labs: Hep B S Ab (no units)  Date Value  11/14/2016 BORDERLINE (A)   Hepatitis B Surface Ag (no units)  Date Value  12/10/2016 NON-REACTIVE   HCV Ab (no units)  Date Value  04/26/2013 Reactive (A)    Lab Results  Component Value Date   HCVGENOTYPE 1a 11/14/2016    Hepatitis C RNA quantitative Latest Ref Rng & Units 11/14/2016  HCV Quantitative NOT DETECTED IU/mL 18,200(H)  HCV Quantitative Log NOT DETECTED Log IU/mL 4.26(H)    AST (U/L)  Date Value  12/10/2016 70 (H)  09/10/2016 73 (H)  08/14/2016 96 (H)  01/04/2016 98 (H)   ALT (U/L)  Date Value  12/10/2016 36  09/10/2016 40  08/14/2016 64 (H)  01/04/2016 52   INR  Date Value  09/10/2016 1.4 ratio (H)  08/05/2016 1.21   05/10/2016 1.30    CrCl: CrCl cannot be calculated (Patient's most recent lab result is older than the maximum 21 days allowed.).  Fibrosis Score: F4  Child-Pugh Score: C  Previous Treatment Regimen: None  Assessment: Jose [name] is here today to follow-up for his Hep C infection.  He has significant cirrhosis with child pugh class C and started 12 weeks of Epclusa and low-dose ribavirin on 8/27.  He is doing well on the medication.  He takes it every morning and has missed no doses.  He is feeling a little tired and does have diarrhea due to the lactulose, but otherwise tolerating it well. He is in alcohol rehab classes and states he wants to get better, so he has decided to stop drinking.  I encouraged him to keep this mind set.  He tells me he takes his omeprazole at different times during the day.  I reminded him to separate it from his Hep C medications and he thinks he is doing that.  I made another calendar for him.  I gave him a flu shot today. I will get Hep C labs and a CBC  today then follow-up with him again next month.    Plans: - Continue Epclusa + Ribavirin 600 mg PO daily - Hep C RNA + CBC today - Flu shot today - F/u with me again 10/24 at 2pm  Cassie L. Kuppelweiser, PharmD, [facility] for Infectious Disease 01/14/2017, 2:50 PM

## 2017-01-16 LAB — HEPATITIS C RNA QUANTITATIVE
HCV QUANT LOG: NOT DETECTED {Log_IU}/mL
HCV RNA, PCR, QN: NOT DETECTED [IU]/mL

## 2017-01-16 LAB — CBC
HCT: 36.7 % — ABNORMAL LOW (ref 38.5–50.0)
Hemoglobin: 12.7 g/dL — ABNORMAL LOW (ref 13.2–17.1)
MCH: 34.9 pg — AB (ref 27.0–33.0)
MCHC: 34.6 g/dL (ref 32.0–36.0)
MCV: 100.8 fL — ABNORMAL HIGH (ref 80.0–100.0)
MPV: 11.6 fL (ref 7.5–12.5)
PLATELETS: 60 10*3/uL — AB (ref 140–400)
RBC: 3.64 10*6/uL — AB (ref 4.20–5.80)
RDW: 12.9 % (ref 11.0–15.0)
WBC: 3.8 10*3/uL (ref 3.8–10.8)

## 2017-01-20 ENCOUNTER — Other Ambulatory Visit: Payer: Self-pay | Admitting: Pharmacist

## 2017-01-20 MED FILL — EPCLUSA 400 MG-100 MG TAB: 400-100 | 28 days supply | Qty: 28 | Fill #2

## 2017-01-20 MED FILL — RIBAVIRIN 200 MG TABLET: 200 | 28 days supply | Qty: 84 | Fill #1

## 2017-02-18 ENCOUNTER — Ambulatory Visit (INDEPENDENT_AMBULATORY_CARE_PROVIDER_SITE_OTHER): Payer: Medicaid Other | Admitting: Pharmacist

## 2017-02-18 DIAGNOSIS — B182 Chronic viral hepatitis C: Secondary | ICD-10-CM

## 2017-02-18 LAB — CBC
HEMATOCRIT: 36 % — AB (ref 38.5–50.0)
HEMOGLOBIN: 12.7 g/dL — AB (ref 13.2–17.1)
MCH: 35.9 pg — ABNORMAL HIGH (ref 27.0–33.0)
MCHC: 35.3 g/dL (ref 32.0–36.0)
MCV: 101.7 fL — ABNORMAL HIGH (ref 80.0–100.0)
MPV: 11.9 fL (ref 7.5–12.5)
Platelets: 54 10*3/uL — ABNORMAL LOW (ref 140–400)
RBC: 3.54 10*6/uL — AB (ref 4.20–5.80)
RDW: 12.1 % (ref 11.0–15.0)
WBC: 2.8 10*3/uL — AB (ref 3.8–10.8)

## 2017-02-18 MED ORDER — SOFOSBUVIR-VELPATASVIR 400-100 MG PO TABS: 1.0000 | ORAL_TABLET | Freq: Every day | ORAL | 0 refills | Status: DC

## 2017-02-18 MED ORDER — RIBAVIRIN 200 MG PO TABS: ORAL_TABLET | ORAL | 0 refills | Status: DC

## 2017-02-18 MED FILL — RIBAVIRIN 200 MG TABLET: 200 | 28 days supply | Qty: 140 | Fill #0

## 2017-02-18 NOTE — Progress Notes (Signed)
HPI: Jose [name] is a 61 y.o. male who presents to the [facility] clinic for Hep C follow-up.  He has genotype 1a, F4 with child pugh class C, and started 12 weeks of Epclusa + low dose RBV on 8/27.   Lab Results  Component Value Date   HCVGENOTYPE 1a 11/14/2016    Allergies: No Known Allergies  Past Medical History: Past Medical History:  Diagnosis Date  . Alcohol abuse   . Alcohol dependence ([place])   . Alcoholism ([place]) 07/12/2015  . Ascites   . Cirrhosis ([place])   . Depression   . Hepatitis C   . Hypertension   . QT prolongation   . Shortness of breath    with exertion  . Thrombocytopenia ([place])     Social History: Social History   Social History  . Marital status: Single    Spouse name: N/A  . Number of children: N/A  . Years of education: N/A   Social History Main Topics  . Smoking status: Current Some Day Smoker    Types: Cigarettes  . Smokeless tobacco: Never Used  . Alcohol use Yes     Comment: A couple beers every few days   . Drug use: No     Comment: last used 11/03/13  . 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 Not on file   Social History Narrative   ** Merged History Encounter **        Labs: Hep B S Ab (no units)  Date Value  11/14/2016 BORDERLINE (A)   Hepatitis B Surface Ag (no units)  Date Value  12/10/2016 NON-REACTIVE   HCV Ab (no units)  Date Value  04/26/2013 Reactive (A)    Lab Results  Component Value Date   HCVGENOTYPE 1a 11/14/2016    Hepatitis C RNA quantitative Latest Ref Rng & Units 01/14/2017 11/14/2016  HCV Quantitative NOT DETECTED IU/mL - 18,200(H)  HCV Quantitative Log NOT DETECT Log IU/mL <1.18 NOT DETECTED 4.26(H)    AST (U/L)  Date Value  12/10/2016 70 (H)  09/10/2016 73 (H)  08/14/2016 96 (H)  01/04/2016 98 (H)   ALT (U/L)  Date Value  12/10/2016 36  09/10/2016 40  08/14/2016 64 (H)  01/04/2016 52   INR  Date Value  09/10/2016 1.4  ratio (H)  08/05/2016 1.21  05/10/2016 1.30    CrCl: CrCl cannot be calculated (Patient's most recent lab result is older than the maximum 21 days allowed.).  Fibrosis Score: F4  Child-Pugh Score: C  Previous Treatment Regimen: None  Assessment: Jose [name] is here today to follow-up for his Hep C infection.  He has significant cirrhosis and is child pugh class C. He started 12 weeks of Epclusa and low dose 600 mg daily ribavirin at the end of August. He is almost done with his second month - he has 2 days left.  He had an issue with getting his 2nd month in the mail.  It was delivered and the UPS receipt said it was delivered to Jose [name]. He never received it and thinks someone stole it.  [name] worked hard to get an override fill through his insurance but he still missed 2 days of medications because of this.  Otherwise, he states he has taken it every day. No issues or side effects.  I will increase his dose of ribavirin for his last month to 1000 mg daily - 3 tablets  in the AM and 2 tablets in the PM. He will pick up his last month from [facility] before he runs out of his 2nd month fill. He will follow up with Dr. [name] next month after he is done for end of therapy visit.   Plans: - Continue Epclusa PO once daily - Increase ribavirin to 1000 mg daily (600 mg in AM and 400 mg in PM) - F/u with Dr. [name] at [facility] on 11/28 at 230pm  [name] L. Etrulia Zarr, PharmD, [place] [facility] for Infectious Disease 02/18/2017, 3:03 PM

## 2017-02-20 ENCOUNTER — Emergency Department (HOSPITAL_COMMUNITY)
Admission: EM | Admit: 2017-02-20 | Discharge: 2017-02-21 | Disposition: A | Discharge status: Home / Self Care | Payer: Medicaid Other | Attending: Emergency Medicine | Admitting: Emergency Medicine

## 2017-02-20 DIAGNOSIS — K4090 Unilateral inguinal hernia, without obstruction or gangrene, not specified as recurrent: Secondary | ICD-10-CM | POA: Diagnosis not present

## 2017-02-20 DIAGNOSIS — F17210 Nicotine dependence, cigarettes, uncomplicated: Secondary | ICD-10-CM | POA: Insufficient documentation

## 2017-02-20 DIAGNOSIS — I10 Essential (primary) hypertension: Secondary | ICD-10-CM | POA: Diagnosis not present

## 2017-02-20 DIAGNOSIS — Z79899 Other long term (current) drug therapy: Secondary | ICD-10-CM | POA: Diagnosis not present

## 2017-02-20 DIAGNOSIS — R110 Nausea: Secondary | ICD-10-CM | POA: Diagnosis not present

## 2017-02-20 DIAGNOSIS — R1084 Generalized abdominal pain: Secondary | ICD-10-CM | POA: Diagnosis present

## 2017-02-20 LAB — URINALYSIS, ROUTINE W REFLEX MICROSCOPIC
BACTERIA UA: NONE SEEN
Bilirubin Urine: NEGATIVE
GLUCOSE, UA: NEGATIVE mg/dL
KETONES UR: NEGATIVE mg/dL
LEUKOCYTES UA: NEGATIVE
NITRITE: NEGATIVE
PH: 5 (ref 5.0–8.0)
PROTEIN: NEGATIVE mg/dL
Specific Gravity, Urine: 1.01 (ref 1.005–1.030)

## 2017-02-20 LAB — COMPREHENSIVE METABOLIC PANEL
ALBUMIN: 2.6 g/dL — AB (ref 3.5–5.0)
ALT: 24 U/L (ref 17–63)
ANION GAP: 8 (ref 5–15)
AST: 49 U/L — ABNORMAL HIGH (ref 15–41)
Alkaline Phosphatase: 177 U/L — ABNORMAL HIGH (ref 38–126)
BUN: 8 mg/dL (ref 6–20)
CALCIUM: 8.3 mg/dL — AB (ref 8.9–10.3)
CO2: 20 mmol/L — AB (ref 22–32)
Chloride: 105 mmol/L (ref 101–111)
Creatinine, Ser: 0.97 mg/dL (ref 0.61–1.24)
GFR calc non Af Amer: >60 mL/min (ref >60)
GLUCOSE: 124 mg/dL — AB (ref 65–99)
Potassium: 3.7 mmol/L (ref 3.5–5.1)
SODIUM: 133 mmol/L — AB (ref 135–145)
Total Bilirubin: 1.9 mg/dL — ABNORMAL HIGH (ref 0.3–1.2)
Total Protein: 6.3 g/dL — ABNORMAL LOW (ref 6.5–8.1)

## 2017-02-20 LAB — CBC WITH DIFFERENTIAL/PLATELET
BASOS PCT: 1 %
Basophils Absolute: 0 10*3/uL (ref 0.0–0.1)
EOS ABS: 0 10*3/uL (ref 0.0–0.7)
Eosinophils Relative: 0 %
HCT: 36.5 % — ABNORMAL LOW (ref 39.0–52.0)
Hemoglobin: 12.7 g/dL — ABNORMAL LOW (ref 13.0–17.0)
LYMPHS ABS: 0.4 10*3/uL — AB (ref 0.7–4.0)
Lymphocytes Relative: 12 %
MCH: 36.2 pg — AB (ref 26.0–34.0)
MCHC: 34.8 g/dL (ref 30.0–36.0)
MCV: 104 fL — ABNORMAL HIGH (ref 78.0–100.0)
MONOS PCT: 19 %
Monocytes Absolute: 0.6 10*3/uL (ref 0.1–1.0)
NEUTROS PCT: 69 %
Neutro Abs: 2.4 10*3/uL (ref 1.7–7.7)
PLATELETS: 53 10*3/uL — AB (ref 150–400)
RBC: 3.51 MIL/uL — ABNORMAL LOW (ref 4.22–5.81)
RDW: 13.1 % (ref 11.5–15.5)
WBC: 3.5 10*3/uL — ABNORMAL LOW (ref 4.0–10.5)

## 2017-02-20 LAB — HEPATITIS C RNA QUANTITATIVE
HCV Quantitative Log: <1.18 Log IU/mL
HCV RNA, PCR, QN: <15 IU/mL

## 2017-02-20 LAB — LIPASE, BLOOD: Lipase: 37 U/L (ref 11–51)

## 2017-02-20 MED ORDER — MORPHINE SULFATE (PF) 4 MG/ML IV SOLN
4.0000 mg | Freq: Once | INTRAVENOUS | Status: AC
  Administered 2017-02-21: 4 mg via INTRAVENOUS
  Filled 2017-02-20: qty 1

## 2017-02-20 MED ORDER — ONDANSETRON HCL 4 MG/2ML IJ SOLN
4.0000 mg | Freq: Once | INTRAMUSCULAR | Status: AC
  Administered 2017-02-21: 4 mg via INTRAVENOUS
  Filled 2017-02-20: qty 2

## 2017-02-20 NOTE — ED Provider Notes (Signed)
[facility] EMERGENCY DEPARTMENT Provider Note   CSN: [id number] Arrival date & time: 02/20/17  2118     History   Chief Complaint Chief Complaint  Patient presents with  . Abdominal Pain    HPI Jose [name] is a 61 y.o. male.  The history is provided by the patient.  Abdominal Pain   This is a new problem. The current episode started 6 to 12 hours ago. The problem occurs constantly. The problem has not changed since onset.The pain is associated with an unknown factor. The pain is located in the generalized abdominal region. The pain is moderate. Associated symptoms include nausea. Pertinent negatives include fever and vomiting. The symptoms are aggravated by palpation. Nothing relieves the symptoms.   60 year old male who presents with abdominal pain. He has a history of alcoholic cirrhosis complicated by ascites and hepatic encephalopathy in the past. Reports onset of abdominal pain earlier today associated with mild abdominal distention/bloating with nausea. Pain not associated with eating. States that with lactulose he was able to have bowel movements earlier today. This had decreased appetite but no vomiting. No melena or hematochezia, dysuria urinary frequency but does feel that his hearing is diminished. No fevers or chills, chest pain or difficulty breathing.   Past Medical History:  Diagnosis Date  . Alcohol abuse   . Alcohol dependence ([place])   . Alcoholism ([place]) 07/12/2015  . Ascites   . Cirrhosis ([place])   . Depression   . Hepatitis C   . Hypertension   . QT prolongation   . Shortness of breath    with exertion  . Thrombocytopenia [facility])     Patient Active Problem List   Diagnosis Date Noted  . Smoker 01/08/2017  . Pancytopenia, acquired ([place]) 01/08/2017  . Pain in right leg 10/06/2016  . End stage liver disease ([place]) 09/10/2016  . Palliative care encounter   . Acute kidney injury ([place]) 08/14/2016  . Acute hepatic encephalopathy 04/26/2016    . 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 Hepatitis C, chronic ([place]) 01/04/2016  . Right inguinal hernia 09/12/2015  . Essential hypertension 09/12/2015  . Gastroesophageal reflux disease without esophagitis 09/12/2015  . Protein-calorie malnutrition, severe ([place]) 09/12/2015  . Anemia of chronic disease 09/12/2015  . Arthritis of left knee 09/12/2015  . Cirrhosis of liver with ascites ([place])   . Elevated LFTs   . Acute-on-chronic renal failure ([place])   . Alcoholic cirrhosis of liver with ascites ([place]) 07/25/2015  . Macrocytic anemia 07/23/2015  . Alcoholism ([place]) 07/12/2015  . ARF (acute renal failure) ([place]) 06/27/2015  . Hepatic encephalopathy ([place]) 06/26/2015  . 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 Adenomatous polyps 10/18/2013  . Unspecified vitamin D deficiency 07/13/2013  . Atopic dermatitis 04/28/2013  . Thrombocytopenia ([place]) 04/28/2013  . UTI (urinary tract infection) 04/26/2013    Past Surgical History:  Procedure Laterality Date  . CIRCUMCISION    . COLONOSCOPY  march 2015   Dr. Renee Harder: nodular mucosa at appendiceal orifice, tubular adenoma, extremely poor prep  . COLONOSCOPY         Home Medications    Prior to Admission medications   Medication Sig Start Date End Date Taking? Authorizing Provider  fluticasone (FLONASE) 50 MCG/ACT nasal spray Place 2 sprays into both nostrils daily.   Yes [provider]  furosemide (LASIX) 40 MG tablet Take 1 tablet (40 mg total) by mouth daily. 11/14/16  Yes Danis, Kirke Corin, MD  ibuprofen (ADVIL,MOTRIN) 200 MG tablet Take  200 mg by mouth every 6 (six) hours as needed for mild pain.   Yes [provider]  lactulose (CHRONULAC) 10 GM/15ML solution Take 45 mLs (30 g total) by mouth 4 (four) times daily. Patient taking differently: Take 30 g by mouth 3 (three) times daily.  08/06/16  Yes Reyne Dumas, MD  omeprazole (PRILOSEC) 20 MG capsule Take 1 capsule (20 mg total) by mouth at bedtime. 12/22/16  Yes Comer, Okey Regal,  MD  ribavirin (COPEGUS) 200 MG tablet Take 3 tablets (600 mg) by mouth in the morning and 2 tablets (400 mg) by mouth in the evening. 02/18/17  Yes Comer, Okey Regal, MD  Sofosbuvir-Velpatasvir (EPCLUSA) 400-100 MG TABS Take 1 tablet by mouth daily. 02/18/17  Yes Comer, Okey Regal, MD  spironolactone (ALDACTONE) 100 MG tablet Take 1 tablet (100 mg total) by mouth daily. 11/14/16  Yes Danis, Kirke Corin, MD  rifaximin (XIFAXAN) 550 MG TABS tablet Take 1 tablet (550 mg total) by mouth 2 (two) times daily. Patient not taking: Reported on 02/20/2017 11/14/16   Doran Stabler, MD    Family History Family History  Problem Relation Age of Onset  . Breast cancer Other   . Diabetes Other   . Hypertension Other   . Breast cancer Mother   . Hypertension Mother   . Diabetes Father   . Hypertension Sister   . Heart disease Sister   . Hypertension Paternal Grandmother   . Colon cancer Neg Hx     Social History Social History  Substance Use Topics  . Smoking status: Current Some Day Smoker    Types: Cigarettes  . Smokeless tobacco: Never Used  . Alcohol use Yes     Comment: A couple beers every few days      Allergies   Patient has no known allergies.   Review of Systems Review of Systems  Constitutional: Negative for fever.  Respiratory: Negative for shortness of breath.   Cardiovascular: Negative for chest pain.  Gastrointestinal: Positive for abdominal distention, abdominal pain and nausea. Negative for vomiting.  All other systems reviewed and are negative.    Physical Exam Updated Vital Signs BP (!) 154/72 (BP Location: Right Arm)   Pulse (!) 50   Temp 98.6 F (37 C) (Oral)   Resp 18   Ht 5\' 6"  (1.676 m)   Wt 91.2 kg (201 lb)   SpO2 100%   BMI 32.44 kg/m   Physical Exam Physical Exam  Nursing note and vitals reviewed. Constitutional: Well developed, well nourished, non-toxic, and in no acute distress Head: Normocephalic and atraumatic.  Mouth/Throat: Oropharynx  is clear and moist.  Neck: Normal range of motion. Neck supple.  Cardiovascular: Normal rate and regular rhythm.   Pulmonary/Chest: Effort normal and breath sounds normal.  Abdominal: Soft. Mild distension. There is low abdominal tenderness. There is no rebound and no guarding.  GU: Right inguinal hernia with bowel in scrotum, unable to be reduced at bedside.  Musculoskeletal: No deformity.  Neurological: Alert, no facial droop, fluent speech, moves all extremities symmetrically Skin: Skin is warm and dry.  Psychiatric: Cooperative   ED Treatments / Results  Labs (all labs ordered are listed, but only abnormal results are displayed) Labs Reviewed  CBC WITH DIFFERENTIAL/PLATELET - Abnormal; Notable for the following:       Result Value   WBC 3.5 (*)    RBC 3.51 (*)    Hemoglobin 12.7 (*)    HCT 36.5 (*)  MCV 104.0 (*)    MCH 36.2 (*)    Platelets 53 (*)    Lymphs Abs 0.4 (*)    All other components within normal limits  COMPREHENSIVE METABOLIC PANEL - Abnormal; Notable for the following:    Sodium 133 (*)    CO2 20 (*)    Glucose, Bld 124 (*)    Calcium 8.3 (*)    Total Protein 6.3 (*)    Albumin 2.6 (*)    AST 49 (*)    Alkaline Phosphatase 177 (*)    Total Bilirubin 1.9 (*)    All other components within normal limits  URINALYSIS, ROUTINE W REFLEX MICROSCOPIC - Abnormal; Notable for the following:    Hgb urine dipstick LARGE (*)    Squamous Epithelial / LPF 0-5 (*)    All other components within normal limits  LIPASE, BLOOD    EKG  EKG Interpretation None       Radiology No results found.  Procedures Procedures (including critical care time)  Medications Ordered in ED Medications  morphine 4 MG/ML injection 4 mg (4 mg Intravenous Given 02/21/17 0010)  ondansetron (ZOFRAN) injection 4 mg (4 mg Intravenous Given 02/21/17 0010)     Initial Impression / Assessment and Plan / ED Course  I have reviewed the triage vital signs and the nursing  notes.  Pertinent labs & imaging results that were available during my care of the patient were reviewed by me and considered in my medical decision making (see chart for details).    61 year old male who presents with abdominal pain. Vital signs are stable. Abdomen mildly distended, and he has evidence of a right inguinal hernia. Very tender to palpation in this area, and concern for incarceration potentially. Given pain control, placed in Trendelenburg burning. Hernia was subsequently reduced at bedside. With this, he reports feeling significantly improved. States that his pain now resolved. Bedside ultrasound performed, without significant ascites. No concerns for SBP. Abdomen benign on repeat examination, low suspicion for other serious intra-abdominal process. Blood work reviewed, with liver dysfunction and pancytopenia consistent with known cirrhosis. PO challenged offered. Tolerating PO and feels better. Will discharge home with referral to surgery, although likely not great candidate due to cirrhosis. Strict return and follow-up instructions reviewed. He expressed understanding of all discharge instructions and felt comfortable with the plan of care.   Final Clinical Impressions(s) / ED Diagnoses   Final diagnoses:  Right inguinal hernia    New Prescriptions New Prescriptions   No medications on file     Forde Dandy, MD 02/21/17 [phone]

## 2017-02-21 NOTE — Discharge Instructions (Signed)
Please call general surgery to discuss if you would need a hernia repair.  Please attempt to push your hernia back in if it is causing some pain. Return to ED if worsening pain, vomiting, fever, or any other symptoms concerning to you.

## 2017-02-23 MED FILL — EPCLUSA 400 MG-100 MG TAB: 400-100 | 28 days supply | Qty: 28 | Fill #0

## 2017-03-02 ENCOUNTER — Other Ambulatory Visit: Payer: Self-pay | Admitting: Gastroenterology

## 2017-03-02 MED FILL — FUROSEMIDE 40 MG TAB: 40 | 30 days supply | Qty: 60 | Fill #1

## 2017-03-02 MED FILL — XIFAXAN 550 MG TABLET: 550 | 30 days supply | Qty: 60 | Fill #0

## 2017-03-02 MED FILL — PROAIR HFA 90 MCG INHALER: 108 (90 BAS | 25 days supply | Qty: 9 | Fill #1

## 2017-03-02 MED FILL — GENERLAC 10 GM/15 ML SOLN: 10 | 30 days supply | Qty: 4050 | Fill #3

## 2017-03-02 MED FILL — OMEPRAZOLE 20 MG CAP: 20 | 30 days supply | Qty: 30 | Fill #2

## 2017-03-03 MED FILL — SPIRONOLACTONE 100 MG TABS: 100 | 30 days supply | Qty: 30 | Fill #0

## 2017-03-03 NOTE — Telephone Encounter (Signed)
Follow up scheduled as instructed.

## 2017-03-03 NOTE — Telephone Encounter (Signed)
Refill request for Aldactone 100mg  once a day. Last seen 11-14-2016, no current follow up.

## 2017-03-03 NOTE — Telephone Encounter (Signed)
Refill done. Please have him see me this coming January.

## 2017-03-25 ENCOUNTER — Ambulatory Visit: Payer: Medicaid Other

## 2017-03-25 ENCOUNTER — Encounter: Payer: Self-pay | Admitting: Internal Medicine

## 2017-03-25 ENCOUNTER — Ambulatory Visit (INDEPENDENT_AMBULATORY_CARE_PROVIDER_SITE_OTHER): Payer: Medicaid Other | Admitting: Internal Medicine

## 2017-03-25 VITALS — BP 117/71 | HR 56 | Temp 98.1°F | Wt 200.0 lb

## 2017-03-25 DIAGNOSIS — B182 Chronic viral hepatitis C: Secondary | ICD-10-CM | POA: Diagnosis present

## 2017-03-25 DIAGNOSIS — K7031 Alcoholic cirrhosis of liver with ascites: Secondary | ICD-10-CM | POA: Diagnosis not present

## 2017-03-25 DIAGNOSIS — F1020 Alcohol dependence, uncomplicated: Secondary | ICD-10-CM

## 2017-03-25 NOTE — Progress Notes (Signed)
   Subjective:    Patient ID: Jose [name], male    DOB: 1956-01-17, 61 y.o.   MRN: [id number]  HPI Here for follow up of hepatitis C. 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  He is also followed by Dr. Loletha Carrow for his cirrhosis and has follow up in January.  He had no issues during treatment with side effects and grateful to have been treated.  No leg swelling, he is taking his diuretics.  He has greatly cut down his drinking but does endorse an 'occasional' beer.     Review of Systems  Constitutional: Negative for fatigue.  Gastrointestinal: Negative for diarrhea.  Skin: Negative for rash.       Objective:   Physical Exam  Constitutional: He appears well-developed and well-nourished. No distress.  HENT:  Mouth/Throat: No oropharyngeal exudate.  Eyes: No scleral icterus.  Cardiovascular: Normal rate, regular rhythm and normal heart sounds.  No murmur heard. Pulmonary/Chest: Effort normal and breath sounds normal. No respiratory distress.  Skin: No rash noted.   SH: occasional alcohol       Assessment & Plan:

## 2017-03-25 NOTE — Assessment & Plan Note (Signed)
I will check his ultrasound now.  He will continue to follow with Dr. Loletha Carrow and I reminded him to continue with his appointments.

## 2017-03-25 NOTE — Assessment & Plan Note (Signed)
Now treated.  I will check EOT lab today and rtc in 4 months for SVR12+.

## 2017-03-25 NOTE — Assessment & Plan Note (Signed)
I again encouraged him to completely stop drinking and discussed the risks.

## 2017-03-26 LAB — CBC WITH DIFFERENTIAL/PLATELET
BASOS ABS: 41 {cells}/uL (ref 0–200)
Basophils Relative: 1.4 %
EOS PCT: 3.1 %
Eosinophils Absolute: 90 cells/uL (ref 15–500)
HCT: 38.3 % — ABNORMAL LOW (ref 38.5–50.0)
Hemoglobin: 13.3 g/dL (ref 13.2–17.1)
Lymphs Abs: 708 cells/uL — ABNORMAL LOW (ref 850–3900)
MCH: 34.8 pg — AB (ref 27.0–33.0)
MCHC: 34.7 g/dL (ref 32.0–36.0)
MCV: 100.3 fL — ABNORMAL HIGH (ref 80.0–100.0)
MONOS PCT: 23 %
MPV: 12 fL (ref 7.5–12.5)
NEUTROS ABS: 1395 {cells}/uL — AB (ref 1500–7800)
NEUTROS PCT: 48.1 %
PLATELETS: 60 10*3/uL — AB (ref 140–400)
RBC: 3.82 10*6/uL — AB (ref 4.20–5.80)
RDW: 11.8 % (ref 11.0–15.0)
Total Lymphocyte: 24.4 %
WBC mixed population: 667 cells/uL (ref 200–950)
WBC: 2.9 10*3/uL — AB (ref 3.8–10.8)

## 2017-03-26 LAB — COMPLETE METABOLIC PANEL WITH GFR
AG RATIO: 0.9 (calc) — AB (ref 1.0–2.5)
ALKALINE PHOSPHATASE (APISO): 192 U/L — AB (ref 40–115)
ALT: 28 U/L (ref 9–46)
AST: 60 U/L — ABNORMAL HIGH (ref 10–35)
Albumin: 2.8 g/dL — ABNORMAL LOW (ref 3.6–5.1)
BILIRUBIN TOTAL: 2.1 mg/dL — AB (ref 0.2–1.2)
BUN: 9 mg/dL (ref 7–25)
CHLORIDE: 106 mmol/L (ref 98–110)
CO2: 25 mmol/L (ref 20–32)
Calcium: 8 mg/dL — ABNORMAL LOW (ref 8.6–10.3)
Creat: 1.03 mg/dL (ref 0.70–1.25)
GFR, EST AFRICAN AMERICAN: 91 mL/min/{1.73_m2} (ref >=60)
GFR, Est Non African American: 79 mL/min/{1.73_m2} (ref >=60)
GLUCOSE: 109 mg/dL — AB (ref 65–99)
Globulin: 3.1 g/dL (calc) (ref 1.9–3.7)
POTASSIUM: 4 mmol/L (ref 3.5–5.3)
Sodium: 137 mmol/L (ref 135–146)
TOTAL PROTEIN: 5.9 g/dL — AB (ref 6.1–8.1)

## 2017-03-27 ENCOUNTER — Encounter (HOSPITAL_COMMUNITY): Admission: RE | Payer: Self-pay | Source: Ambulatory Visit

## 2017-03-27 ENCOUNTER — Ambulatory Visit
Admission: RE | Admit: 2017-03-27 | Discharge: 2017-03-27 | Disposition: A | Discharge status: Home / Self Care | Payer: Medicaid Other | Source: Ambulatory Visit | Attending: Internal Medicine | Admitting: Internal Medicine

## 2017-03-27 ENCOUNTER — Ambulatory Visit (HOSPITAL_COMMUNITY): Admission: RE | Admit: 2017-03-27 | Payer: Medicaid Other | Source: Ambulatory Visit | Admitting: Oral Surgery

## 2017-03-27 DIAGNOSIS — B182 Chronic viral hepatitis C: Secondary | ICD-10-CM

## 2017-03-27 LAB — HEPATITIS C RNA QUANTITATIVE
HCV QUANT LOG: NOT DETECTED {Log_IU}/mL
HCV RNA, PCR, QN: NOT DETECTED [IU]/mL

## 2017-03-27 SURGERY — MULTIPLE EXTRACTION WITH ALVEOLOPLASTY
Anesthesia: General | Laterality: Bilateral

## 2017-04-02 MED FILL — TRIAMTERENE/HCTZ 37.5/25 TB: 37.5-25 | 30 days supply | Qty: 30 | Fill #0

## 2017-04-02 MED FILL — PROAIR HFA 90 MCG INHALER: 108 (90 BAS | 25 days supply | Qty: 9 | Fill #0

## 2017-04-08 MED FILL — OMEPRAZOLE 20 MG CAP: 20 | 30 days supply | Qty: 30 | Fill #3

## 2017-04-24 MED FILL — GENERLAC 10 GM/15 ML SOLN: 10 | 30 days supply | Qty: 4050 | Fill #1

## 2017-05-05 MED FILL — TRIAMTERENE/HCTZ 37.5/25 TB: 37.5-25 | 30 days supply | Qty: 30 | Fill #1

## 2017-05-05 MED FILL — XIFAXAN 550 MG TABLET: 550 | 30 days supply | Qty: 60 | Fill #1

## 2017-05-08 ENCOUNTER — Ambulatory Visit: Payer: Medicaid Other | Admitting: Gastroenterology

## 2017-05-11 ENCOUNTER — Other Ambulatory Visit: Payer: Self-pay

## 2017-05-13 NOTE — H&P (Signed)
HISTORY AND PHYSICAL  Jose [name] is a 62 y.o. male patient referred by general dentist for multiple extractions.  No diagnosis found.  Past Medical History:  Diagnosis Date  . Alcohol abuse   . Alcohol dependence ([place])   . Alcoholism ([place]) 07/12/2015  . Ascites   . Cirrhosis ([place])   . Depression   . Hepatitis C   . Hypertension   . QT prolongation   . Shortness of breath    with exertion  . Thrombocytopenia ([place])     No current facility-administered medications for this encounter.    Current Outpatient Medications  Medication Sig Dispense Refill  . Cholecalciferol (VITAMIN D3 PO) Take 1 capsule by mouth daily.    . fluticasone (FLONASE) 50 MCG/ACT nasal spray Place 1 spray into both nostrils daily.     Marland Kitchen ibuprofen (ADVIL,MOTRIN) 200 MG tablet Take 200 mg by mouth every 6 (six) hours as needed for headache or mild pain.     Marland Kitchen lactulose (CHRONULAC) 10 GM/15ML solution Take 45 mLs (30 g total) by mouth 4 (four) times daily. (Patient taking differently: Take 30 g by mouth 3 (three) times daily. ) 500 mL 2  . omeprazole (PRILOSEC) 20 MG capsule Take 1 capsule (20 mg total) by mouth at bedtime. 30 capsule 11  . PROAIR HFA 108 (90 Base) MCG/ACT inhaler Inhale 2 puffs into the lungs 4 (four) times daily as needed for shortness of breath or wheezing.  3  . rifaximin (XIFAXAN) 550 MG TABS tablet Take 1 tablet (550 mg total) by mouth 2 (two) times daily. 60 tablet 6  . triamterene-hydrochlorothiazide (MAXZIDE-25) 37.5-25 MG tablet Take 1 tablet by mouth daily.    Marland Kitchen VITAMIN A PO Take 1 tablet by mouth daily.    . furosemide (LASIX) 40 MG tablet Take 1 tablet (40 mg total) by mouth daily. (Patient not taking: Reported on 05/11/2017) 30 tablet 2  . spironolactone (ALDACTONE) 100 MG tablet TAKE 1 TABLET (100 MG TOTAL) BY MOUTH DAILY. (Patient not taking: Reported on 05/11/2017) 30 tablet 2   No Known Allergies Active Problems:   * No active hospital problems. *  Vitals: There were no vitals  taken for this visit. Lab results:No results found for this or any previous visit (from the past 2 hour(s)). Radiology Results: No results found. General appearance: alert, cooperative and no distress Head: Normocephalic, without obvious abnormality, atraumatic Eyes: negative Nose: Nares normal. Septum midline. Mucosa normal. No drainage or sinus tenderness. Throat: multiple carious teeth, no purulence, fluctuance or trismus. Pharynx clear. Neck: no adenopathy, supple, symmetrical, trachea midline and thyroid not enlarged, symmetric, no tenderness/mass/nodules Resp: clear to auscultation bilaterally Cardio: regular rate and rhythm, S1, S2 normal, no murmur, click, rub or gallop  Assessment: Multiple nonrestorable teeth secondary to caries. Possible cyst left mandible aassociated with abscessed tooth  Plan: Multiple dental extractions with alveoloplasty. GA. Nasal. Day surgery.   Diona Browner 05/13/2017

## 2017-05-14 ENCOUNTER — Other Ambulatory Visit: Payer: Self-pay

## 2017-05-14 ENCOUNTER — Encounter (HOSPITAL_COMMUNITY): Payer: Self-pay | Admitting: *Deleted

## 2017-05-14 NOTE — Progress Notes (Signed)
Jose [name] has a history of Liver disease, I asked anesthesia PA/NT to follow.

## 2017-05-14 NOTE — Progress Notes (Signed)
Anesthesia Chart Review:  Pt is a same day work up.   Pt is a 62 year old male scheduled for dental restorations/extractions on 05/15/2017 with Tanda Rockers, DDS  - PCP is [name], MD - ID is Scharlene Gloss, MD - GI is Wilfrid Lund, MD - Hematologist is Heath Lark, MD who sees pt for acquired pancytopenia due to alcoholic liver cirrhosis and hemochromatosis  PMH includes:  HTN, alcohol abuse, liver cirrhosis, ascites, hepatitis C, thrombocytopenia, hemochromatosis.  Current smoker. Current alcohol use is reportedly occasional.   Medications include: Lactulose, Prilosec, albuterol, rifaximin, Maxzide  Labs will be obtained day of surgery.  - Platelet count was 60 on 03/25/17.   CXR 07/25/16: No active cardiopulmonary disease.  EKG 08/14/16: Sinus rhythm. LVH  Abdominal US 03/27/17:  1. Mass in right lobe of liver measures up to 4.4 cm, increased in size from prior MRI of the abdomen given differences in technique. 2. Liver cirrhosis. 3. Cholelithiasis. 4. Nonspecific mild gallbladder wall thickening, probably related to cirrhosis in the absence of sonographic Murphy's sign.  Echo 09/06/15:  - Left ventricle: The cavity size was normal. Wall thickness was increased in a pattern of mild LVH. Systolic function was vigorous. The estimated ejection fraction was in the range of 65% to 70%. Wall motion was normal; there were no regional wall motion abnormalities. Doppler parameters are consistent with abnormal left ventricular relaxation (grade 1 diastolic dysfunction). - Aortic valve: There was mild regurgitation.  Mean gradient (S): 17 mm Hg. Peak gradient (S): 32 mm Hg. - Mitral valve: Calcified annulus. - Left atrium: The atrium was moderately dilated. - Right atrium: The atrium was mildly dilated. - Pulmonary arteries: Systolic pressure was mildly increased. PA   peak pressure: 33 mm Hg (S). - Impressions: Vigorous LV systolic function; grade 1 diastolic dysfunction; calcified  aortic valve; elevated mean gradient of 17 mmHg suggests mild AS but visually valve opens well; some of elevated gradient likely related to vigorous LV function; mild AI; trace MR; moderate LAE; mild RAE; trace TR with mildly elevated pulmonary pressure.  If labs acceptable day of surgery. , I anticipate pt can proceed with surgery as scheduled.   Willeen Cass, FNP-BC [facility] Short Stay Surgical Center/Anesthesiology Phone: [phone] 05/14/2017 11:08 AM

## 2017-05-15 ENCOUNTER — Encounter (HOSPITAL_COMMUNITY)
Admission: RE | Disposition: A | Discharge status: Home / Self Care | Payer: Self-pay | Source: Ambulatory Visit | Attending: Oral Surgery

## 2017-05-15 ENCOUNTER — Ambulatory Visit (HOSPITAL_COMMUNITY): Payer: Medicaid Other | Admitting: Emergency Medicine

## 2017-05-15 ENCOUNTER — Ambulatory Visit (HOSPITAL_COMMUNITY)
Admission: RE | Admit: 2017-05-15 | Discharge: 2017-05-15 | Disposition: A | Discharge status: Home / Self Care | Payer: Medicaid Other | Source: Ambulatory Visit | Attending: Oral Surgery | Admitting: Oral Surgery

## 2017-05-15 ENCOUNTER — Encounter (HOSPITAL_COMMUNITY): Payer: Self-pay | Admitting: Certified Registered Nurse Anesthetist

## 2017-05-15 DIAGNOSIS — K056 Periodontal disease, unspecified: Secondary | ICD-10-CM | POA: Diagnosis not present

## 2017-05-15 DIAGNOSIS — K7030 Alcoholic cirrhosis of liver without ascites: Secondary | ICD-10-CM | POA: Insufficient documentation

## 2017-05-15 DIAGNOSIS — I700 Atherosclerosis of aorta: Secondary | ICD-10-CM | POA: Diagnosis not present

## 2017-05-15 DIAGNOSIS — F17200 Nicotine dependence, unspecified, uncomplicated: Secondary | ICD-10-CM | POA: Diagnosis not present

## 2017-05-15 DIAGNOSIS — B1920 Unspecified viral hepatitis C without hepatic coma: Secondary | ICD-10-CM | POA: Insufficient documentation

## 2017-05-15 DIAGNOSIS — F329 Major depressive disorder, single episode, unspecified: Secondary | ICD-10-CM | POA: Diagnosis not present

## 2017-05-15 DIAGNOSIS — Z79899 Other long term (current) drug therapy: Secondary | ICD-10-CM | POA: Insufficient documentation

## 2017-05-15 DIAGNOSIS — K029 Dental caries, unspecified: Secondary | ICD-10-CM | POA: Insufficient documentation

## 2017-05-15 DIAGNOSIS — D61818 Other pancytopenia: Secondary | ICD-10-CM | POA: Insufficient documentation

## 2017-05-15 DIAGNOSIS — I10 Essential (primary) hypertension: Secondary | ICD-10-CM | POA: Insufficient documentation

## 2017-05-15 DIAGNOSIS — R188 Other ascites: Secondary | ICD-10-CM | POA: Diagnosis not present

## 2017-05-15 HISTORY — PX: TOOTH EXTRACTION: SHX859

## 2017-05-15 HISTORY — DX: Unspecified hearing loss, unspecified ear: H91.90

## 2017-05-15 HISTORY — DX: Unilateral inguinal hernia, without obstruction or gangrene, not specified as recurrent: K40.90

## 2017-05-15 LAB — CBC
HEMATOCRIT: 40.3 % (ref 39.0–52.0)
Hemoglobin: 13.8 g/dL (ref 13.0–17.0)
MCH: 35.7 pg — ABNORMAL HIGH (ref 26.0–34.0)
MCHC: 34.2 g/dL (ref 30.0–36.0)
MCV: 104.1 fL — AB (ref 78.0–100.0)
PLATELETS: 60 10*3/uL — AB (ref 150–400)
RBC: 3.87 MIL/uL — AB (ref 4.22–5.81)
RDW: 15.1 % (ref 11.5–15.5)
WBC: 3 10*3/uL — AB (ref 4.0–10.5)

## 2017-05-15 LAB — BASIC METABOLIC PANEL
ANION GAP: 7 (ref 5–15)
BUN: 10 mg/dL (ref 6–20)
CO2: 24 mmol/L (ref 22–32)
Calcium: 8.1 mg/dL — ABNORMAL LOW (ref 8.9–10.3)
Chloride: 106 mmol/L (ref 101–111)
Creatinine, Ser: 1.09 mg/dL (ref 0.61–1.24)
GFR calc Af Amer: >60 mL/min (ref >60)
GLUCOSE: 108 mg/dL — AB (ref 65–99)
POTASSIUM: 4.4 mmol/L (ref 3.5–5.1)
Sodium: 137 mmol/L (ref 135–145)

## 2017-05-15 SURGERY — DENTAL RESTORATION/EXTRACTIONS
Anesthesia: General | Site: Mouth

## 2017-05-15 MED ORDER — LACTATED RINGERS IV SOLN
INTRAVENOUS | Status: DC | PRN
  Administered 2017-05-15: 07:00:00 via INTRAVENOUS

## 2017-05-15 MED ORDER — PROPOFOL 10 MG/ML IV BOLUS
INTRAVENOUS | Status: AC
  Filled 2017-05-15: qty 20

## 2017-05-15 MED ORDER — SODIUM CHLORIDE 0.9 % IR SOLN
Status: DC | PRN
  Administered 2017-05-15: 1000 mL

## 2017-05-15 MED ORDER — DEXAMETHASONE SODIUM PHOSPHATE 10 MG/ML IJ SOLN
INTRAMUSCULAR | Status: DC | PRN
  Administered 2017-05-15: 30 mg via INTRAVENOUS
  Administered 2017-05-15: 10 mg via INTRAVENOUS

## 2017-05-15 MED ORDER — CEFAZOLIN SODIUM-DEXTROSE 2-4 GM/100ML-% IV SOLN
2.0000 g | INTRAVENOUS | Status: AC
  Administered 2017-05-15: 2 g via INTRAVENOUS

## 2017-05-15 MED ORDER — LACTATED RINGERS IV SOLN
Freq: Once | INTRAVENOUS | Status: AC
  Administered 2017-05-15: 07:00:00 via INTRAVENOUS

## 2017-05-15 MED ORDER — SUCCINYLCHOLINE CHLORIDE 200 MG/10ML IV SOSY
PREFILLED_SYRINGE | INTRAVENOUS | Status: DC | PRN
  Administered 2017-05-15: 120 mg via INTRAVENOUS

## 2017-05-15 MED ORDER — MIDAZOLAM HCL 2 MG/2ML IJ SOLN
INTRAMUSCULAR | Status: AC
  Filled 2017-05-15: qty 2

## 2017-05-15 MED ORDER — ONDANSETRON HCL 4 MG/2ML IJ SOLN
INTRAMUSCULAR | Status: AC
  Filled 2017-05-15: qty 2

## 2017-05-15 MED ORDER — AMOXICILLIN 500 MG PO CAPS: 500.0000 mg | ORAL_CAPSULE | Freq: Three times a day (TID) | ORAL | 0 refills | Status: DC

## 2017-05-15 MED ORDER — LIDOCAINE 2% (20 MG/ML) 5 ML SYRINGE
INTRAMUSCULAR | Status: AC
Start: 2017-05-15 — End: 2017-05-15
  Filled 2017-05-15: qty 5

## 2017-05-15 MED ORDER — CEFAZOLIN SODIUM-DEXTROSE 2-4 GM/100ML-% IV SOLN
INTRAVENOUS | Status: AC
  Filled 2017-05-15: qty 100

## 2017-05-15 MED ORDER — DEXAMETHASONE SODIUM PHOSPHATE 10 MG/ML IJ SOLN
INTRAMUSCULAR | Status: AC
  Filled 2017-05-15: qty 1

## 2017-05-15 MED ORDER — ONDANSETRON HCL 4 MG/2ML IJ SOLN
INTRAMUSCULAR | Status: DC | PRN
  Administered 2017-05-15: 4 mg via INTRAVENOUS

## 2017-05-15 MED ORDER — MIDAZOLAM HCL 5 MG/5ML IJ SOLN
INTRAMUSCULAR | Status: DC | PRN
  Administered 2017-05-15: 1 mg via INTRAVENOUS

## 2017-05-15 MED ORDER — PROPOFOL 10 MG/ML IV BOLUS
INTRAVENOUS | Status: DC | PRN
  Administered 2017-05-15: 150 mg via INTRAVENOUS
  Administered 2017-05-15: 20 mg via INTRAVENOUS
  Administered 2017-05-15: 30 mg via INTRAVENOUS

## 2017-05-15 MED ORDER — PHENYLEPHRINE HCL 10 MG/ML IJ SOLN
INTRAVENOUS | Status: DC | PRN
  Administered 2017-05-15: 20 ug/min via INTRAVENOUS

## 2017-05-15 MED ORDER — LIDOCAINE 2% (20 MG/ML) 5 ML SYRINGE
INTRAMUSCULAR | Status: DC | PRN
  Administered 2017-05-15: 100 mg via INTRAVENOUS

## 2017-05-15 MED ORDER — LIDOCAINE-EPINEPHRINE 2 %-1:100000 IJ SOLN
INTRAMUSCULAR | Status: DC | PRN
  Administered 2017-05-15: 16 mL via INTRADERMAL

## 2017-05-15 MED ORDER — FENTANYL CITRATE (PF) 100 MCG/2ML IJ SOLN: 25.0000 ug | INTRAMUSCULAR | Status: DC | PRN

## 2017-05-15 MED ORDER — LIDOCAINE-EPINEPHRINE 1 %-1:100000 IJ SOLN
INTRAMUSCULAR | Status: AC
  Filled 2017-05-15: qty 1

## 2017-05-15 MED ORDER — ROCURONIUM BROMIDE 10 MG/ML (PF) SYRINGE
PREFILLED_SYRINGE | INTRAVENOUS | Status: AC
  Filled 2017-05-15: qty 5

## 2017-05-15 MED ORDER — PROMETHAZINE HCL 25 MG/ML IJ SOLN: 6.2500 mg | INTRAMUSCULAR | Status: DC | PRN

## 2017-05-15 MED ORDER — HEMOSTATIC AGENTS (NO CHARGE) OPTIME
TOPICAL | Status: DC | PRN
  Administered 2017-05-15: 1 via TOPICAL

## 2017-05-15 MED ORDER — OXYCODONE-ACETAMINOPHEN 5-325 MG PO TABS: 1.0000 | ORAL_TABLET | ORAL | 0 refills | Status: DC | PRN

## 2017-05-15 MED ORDER — FENTANYL CITRATE (PF) 100 MCG/2ML IJ SOLN
INTRAMUSCULAR | Status: DC | PRN
  Administered 2017-05-15 (×2): 50 ug via INTRAVENOUS

## 2017-05-15 MED ORDER — SUCCINYLCHOLINE CHLORIDE 200 MG/10ML IV SOSY
PREFILLED_SYRINGE | INTRAVENOUS | Status: AC
  Filled 2017-05-15: qty 10

## 2017-05-15 MED ORDER — OXYMETAZOLINE HCL 0.05 % NA SOLN
NASAL | Status: AC
  Filled 2017-05-15: qty 15

## 2017-05-15 MED ORDER — LIDOCAINE-EPINEPHRINE 2 %-1:100000 IJ SOLN
INTRAMUSCULAR | Status: AC
  Filled 2017-05-15: qty 1

## 2017-05-15 MED ORDER — FENTANYL CITRATE (PF) 250 MCG/5ML IJ SOLN
INTRAMUSCULAR | Status: AC
  Filled 2017-05-15: qty 5

## 2017-05-15 MED FILL — AMOXICILLIN 500 MG CAPSULE: 500 | 7 days supply | Qty: 21 | Fill #0

## 2017-05-15 MED FILL — OXYCODONE-ACETAMINOPHEN 5-3: 5-325 | 5 days supply | Qty: 20 | Fill #0

## 2017-05-15 SURGICAL SUPPLY — 32 items
BLADE SURG 15 STRL LF DISP TIS (BLADE) IMPLANT
BLADE SURG 15 STRL SS (BLADE)
BUR CROSS CUT FISSURE 1.6 (BURR) ×2 IMPLANT
BUR CROSS CUT FISSURE 1.6MM (BURR) ×1
BUR EGG ELITE 4.0 (BURR) IMPLANT
BUR EGG ELITE 4.0MM (BURR)
CANISTER SUCT 3000ML PPV (MISCELLANEOUS) ×3 IMPLANT
COVER SURGICAL LIGHT HANDLE (MISCELLANEOUS) ×3 IMPLANT
GAUZE PACKING FOLDED 2  STR (GAUZE/BANDAGES/DRESSINGS) ×2
GAUZE PACKING FOLDED 2 STR (GAUZE/BANDAGES/DRESSINGS) ×1 IMPLANT
GLOVE BIO SURGEON STRL SZ 6.5 (GLOVE) IMPLANT
GLOVE BIO SURGEON STRL SZ7 (GLOVE) IMPLANT
GLOVE BIO SURGEON STRL SZ7.5 (GLOVE) ×3 IMPLANT
GLOVE BIO SURGEONS STRL SZ 6.5 (GLOVE)
GLOVE BIOGEL PI IND STRL 6.5 (GLOVE) IMPLANT
GLOVE BIOGEL PI IND STRL 7.0 (GLOVE) IMPLANT
GLOVE BIOGEL PI INDICATOR 6.5 (GLOVE)
GLOVE BIOGEL PI INDICATOR 7.0 (GLOVE)
GOWN STRL REUS W/ TWL LRG LVL3 (GOWN DISPOSABLE) ×1 IMPLANT
GOWN STRL REUS W/ TWL XL LVL3 (GOWN DISPOSABLE) ×1 IMPLANT
GOWN STRL REUS W/TWL LRG LVL3 (GOWN DISPOSABLE) ×2
GOWN STRL REUS W/TWL XL LVL3 (GOWN DISPOSABLE) ×2
KIT BASIN OR (CUSTOM PROCEDURE TRAY) ×3 IMPLANT
KIT ROOM TURNOVER OR (KITS) ×3 IMPLANT
NEEDLE 22X1 1/2 (OR ONLY) (NEEDLE) ×6 IMPLANT
NS IRRIG 1000ML POUR BTL (IV SOLUTION) ×3 IMPLANT
PAD ARMBOARD 7.5X6 YLW CONV (MISCELLANEOUS) ×3 IMPLANT
SPONGE SURGIFOAM ABS GEL 12-7 (HEMOSTASIS) ×3 IMPLANT
SUT CHROMIC 3 0 PS 2 (SUTURE) ×3 IMPLANT
TRAY ENT MC OR (CUSTOM PROCEDURE TRAY) ×3 IMPLANT
TUBING IRRIGATION (MISCELLANEOUS) ×3 IMPLANT
YANKAUER SUCT BULB TIP NO VENT (SUCTIONS) ×3 IMPLANT

## 2017-05-15 NOTE — Transfer of Care (Signed)
Immediate Anesthesia Transfer of Care Note  Patient: Jose [name]  Procedure(s) Performed: DENTAL RESTORATION and multiple teeth EXTRACTIONS (N/A Mouth)  Patient Location: PACU  Anesthesia Type:General  Level of Consciousness: awake, alert  and oriented  Airway & Oxygen Therapy: Patient Spontanous Breathing and Patient connected to face mask oxygen  Post-op Assessment: Report given to RN, Post -op Vital signs reviewed and stable and Patient moving all extremities X 4  Post vital signs: Reviewed and stable  Last Vitals:  Vitals:   05/15/17 0629  BP: 124/65  Resp: 20  Temp: 36.8 C  SpO2: 100%    Last Pain:  Vitals:   05/15/17 0629  TempSrc: Oral      Patients Stated Pain Goal: 3 (23/36/12 2449)  Complications: No apparent anesthesia complications

## 2017-05-15 NOTE — Anesthesia Preprocedure Evaluation (Signed)
Anesthesia Evaluation  Patient identified by MRN, date of birth, ID band Patient awake    Reviewed: Allergy & Precautions, NPO status , Patient's Chart, lab work & pertinent test results  Airway Mallampati: II  TM Distance: >3 FB Neck ROM: Full    Dental no notable dental hx.    Pulmonary Current Smoker,    Pulmonary exam normal breath sounds clear to auscultation       Cardiovascular hypertension, Normal cardiovascular exam Rhythm:Regular Rate:Normal     Neuro/Psych negative neurological ROS  negative psych ROS   GI/Hepatic negative GI ROS, (+)     substance abuse  alcohol use, Hepatitis -, CPancytopenia   Endo/Other  negative endocrine ROS  Renal/GU negative Renal ROS  negative genitourinary   Musculoskeletal negative musculoskeletal ROS (+)   Abdominal   Peds negative pediatric ROS (+)  Hematology negative hematology ROS (+)   Anesthesia Other Findings   Reproductive/Obstetrics negative OB ROS                             Anesthesia Physical Anesthesia Plan  ASA: III  Anesthesia Plan: General   Post-op Pain Management:    Induction: Intravenous  PONV Risk Score and Plan: Ondansetron and Treatment may vary due to age or medical condition  Airway Management Planned: Nasal ETT and Oral ETT  Additional Equipment:   Intra-op Plan:   Post-operative Plan: Extubation in OR  Informed Consent: I have reviewed the patients History and Physical, chart, labs and discussed the procedure including the risks, benefits and alternatives for the proposed anesthesia with the patient or authorized representative who has indicated his/her understanding and acceptance.   Dental advisory given  Plan Discussed with: CRNA  Anesthesia Plan Comments: (Awaiting platelet count to decide between nasal vs oral tube)        Anesthesia Quick Evaluation

## 2017-05-15 NOTE — Anesthesia Procedure Notes (Signed)
Procedure Name: Intubation Date/Time: 05/15/2017 8:49 AM Performed by: Harden Mo, CRNA Pre-anesthesia Checklist: Patient identified, Emergency Drugs available, Suction available and Patient being monitored Patient Re-evaluated:Patient Re-evaluated prior to induction Oxygen Delivery Method: Circle System Utilized Preoxygenation: Pre-oxygenation with 100% oxygen Induction Type: IV induction Ventilation: Mask ventilation without difficulty Laryngoscope Size: Miller and 2 Grade View: Grade I Tube type: Oral Rae Tube size: 7.5 mm Number of attempts: 1 Airway Equipment and Method: Stylet Placement Confirmation: ETT inserted through vocal cords under direct vision,  positive ETCO2 and breath sounds checked- equal and bilateral Tube secured with: Tape Dental Injury: Teeth and Oropharynx as per pre-operative assessment

## 2017-05-15 NOTE — H&P (Signed)
H&P documentation  -History and Physical Reviewed  -Patient has been re-examined  -No change in the plan of care  Jose [name]  

## 2017-05-15 NOTE — Op Note (Signed)
Jose [name], Jose [name]                  ACCOUNT NO.:  [id number]  MEDICAL RECORD NO.:  [id number]  LOCATION:  PERIO                        FACILITY:  [facility]  PHYSICIAN:  Gae Bon, M.D.  DATE OF BIRTH:  11-30-1955  DATE OF PROCEDURE:  05/15/2017 DATE OF DISCHARGE:                              OPERATIVE REPORT   PREOPERATIVE DIAGNOSES:  Nonrestorable teeth secondary to dental caries and periodontal disease, numbers 6, 7, 8, 9, 10, 11, 13, 17, 20, 21, 29, 32; possible cyst, left mandible.  POSTOPERATIVE DIAGNOSES:  Nonrestorable teeth secondary to dental caries and periodontal disease, numbers 6, 7, 8, 9, 10, 11, 13, 17, 20, 21, 29, 32; no cyst identified in the left mandible.  PROCEDURE:  Extraction of teeth numbers 6, 7, 8, 9, 10, 11, 13, 17, 20, 21, 29, 32.  SURGEON:  Gae Bon, MD.  ANESTHESIA:  General, Dr. Kalman Shan attending, oral intubation.  DESCRIPTION OF PROCEDURE:  The patient was taken to the operating room and placed on the table in supine position.  General anesthesia was administered intravenously and oral endotracheal tube was placed and secured.  The eyes were protected and the patient was draped for surgery.  Time-out was performed.  The posterior pharynx was suctioned. A throat pack was placed.  A 2% lidocaine with 1:100,000 epinephrine was infiltrated in the inferior alveolar block on the right and left sides and buccal and palatal infiltration in the maxilla around the teeth to be removed.  A total of 16 mL was utilized.  A bite block was placed on the right side of the mouth.  The left side was operated first.  A #15 blade was used to make an incision around tooth #17 and then another incision was made around teeth numbers 20 and 21.  A maxilla incision was made around tooth #13, carried along the crest of the ridge to tooth #11 and then carried in the gingival sulcus both buccally and palatally to tooth #6.  The periosteum was then reflected from around  these teeth. The teeth were elevated with a 301 elevator and the lower teeth were removed with a dental forceps.  The upper teeth were removed with the upper dental forceps with the exception of tooth #11 which fractured during removal.  Then, bone was removed from around this tooth and then the tooth was elevated with a 301 elevator and removed from the mouth with a dental forceps.  Then, the tissue in the buccal aspect of the left maxilla was reflected to expose the irregular alveolar bony sockets and crest.  Egg-shaped bur and bone file were used to perform alveoplasty.  Then, Gel-Foam was placed in the sockets and the area was closed with 3-0 chromic in the left mandible.  Gel-Foam was placed in the sockets.  Tooth #20 had been curetted prior to closure and no identifiable cyst was found in the apex of the socket.  Then, after closure of the left side with 3-0 chromic, the bite block was removed. The endotracheal tube was repositioned at the other side of the mouth. The throat pack was removed and a new throat pack was placed after changing the ET tube.  Then, the sweetheart and bite block were positioned in the mouth on the left side and attention was turned to the right maxilla and mandible.  #15 blade was used to make an incision around tooth #6 with a proximal incision along the alveolar crest of 1 cm.  The periosteum was reflected.  The tooth was elevated and then removed from the mouth with a dental forceps.  #15 blade was then used to make an incision around teeth numbers 29 and 32.  The periosteum was reflected.  The teeth were elevated with a 301 elevator and removed from the mouth with a dental forceps.  Then, these 3 sockets were curetted and irrigated.  The periosteum was reflected buccally around tooth #6 so that alveoplasty could be performed to reduce the bony contour irregularities.  The egg-shaped bur was used for this as well as the bone file.  Then, the sockets were  irrigated and closed with 3-0 chromic.  The oral cavity was then inspected and found to have good contour, hemostasis, and closure.  The oral cavity was irrigated and suctioned.  Throat pack was removed.  The patient was left in care of Anesthesia for awakening, extubation, and transportation to the recovery room.  The patient was scheduled for discharge to through Same-Day Surgery.  ESTIMATED BLOOD LOSS:  Minimum.  COMPLICATIONS:  None.  SPECIMENS:  None.     Gae Bon, M.D.     SMJ/MEDQ  D:  05/15/2017  T:  05/15/2017  Job:  [id number]

## 2017-05-15 NOTE — Op Note (Signed)
05/15/2017  9:32 AM  PATIENT:  Rolland Porter Bacci  62 y.o. male  PRE-OPERATIVE DIAGNOSIS:  NON RESTORABLE teeth #6, 7, 8, 9,10, 11, 13, 17, 20, 21, 29, 32, possible cyst left mandible  POST-OPERATIVE DIAGNOSIS:  SAME= No cyst identified left mandible  PROCEDURE:  Procedure(s):  teeth EXTRACTIONS  #6, 7, 8, 9,10, 11, 13, 17, 20, 21, 29, 32,  SURGEON:  Surgeon(s): Diona Browner, DDS  ANESTHESIA:   local and general  EBL:  minimal  DRAINS: none   SPECIMEN:  No Specimen  COUNTS:  YES  PLAN OF CARE: Discharge to home after PACU  PATIENT DISPOSITION:  PACU - hemodynamically stable.   PROCEDURE DETAILS: Dictation #   Gae Bon, DMD 05/15/2017 9:32 AM

## 2017-05-15 NOTE — Anesthesia Postprocedure Evaluation (Signed)
Anesthesia Post Note  Patient: Jose [name]  Procedure(s) Performed: DENTAL RESTORATION and multiple teeth EXTRACTIONS (N/A Mouth)     Patient location during evaluation: PACU Anesthesia Type: General Level of consciousness: awake and alert Pain management: pain level controlled Vital Signs Assessment: post-procedure vital signs reviewed and stable Respiratory status: spontaneous breathing, nonlabored ventilation, respiratory function stable and patient connected to nasal cannula oxygen Cardiovascular status: blood pressure returned to baseline and stable Postop Assessment: no apparent nausea or vomiting Anesthetic complications: no    Last Vitals:  Vitals:   05/15/17 1011 05/15/17 1026  BP: 124/80 127/85  Pulse: (!) 53 (!) 53  Resp: 18 19  Temp:    SpO2: 95% 96%    Last Pain:  Vitals:   05/15/17 0941  TempSrc:   PainSc: Asleep                 Lindi Abram S

## 2017-05-16 ENCOUNTER — Encounter (HOSPITAL_COMMUNITY): Payer: Self-pay | Admitting: Oral Surgery

## 2017-06-03 MED FILL — XIFAXAN 550 MG TABLET: 550 | 30 days supply | Qty: 60 | Fill #2

## 2017-06-03 MED FILL — TRIAMTERENE/HCTZ 37.5/25 TB: 37.5-25 | 30 days supply | Qty: 30 | Fill #2

## 2017-06-05 MED FILL — GENERLAC 10 GM/15 ML SOLN: 10 | 14 days supply | Qty: 1892 | Fill #0

## 2017-06-23 ENCOUNTER — Other Ambulatory Visit: Payer: Self-pay

## 2017-06-23 ENCOUNTER — Ambulatory Visit: Payer: Medicaid Other | Admitting: Gastroenterology

## 2017-06-25 MED FILL — FLUTICASONE PROP 50 MCG SPR: 50 | 60 days supply | Qty: 16 | Fill #1

## 2017-06-25 MED FILL — LACTULOSE Solution 10g/15ml: 10 | 30 days supply | Qty: 4050 | Fill #0

## 2017-06-25 MED FILL — PROAIR HFA 90 MCG INHALER: 108 (90 BAS | 25 days supply | Qty: 9 | Fill #1

## 2017-07-07 ENCOUNTER — Other Ambulatory Visit: Payer: Self-pay | Admitting: Hematology and Oncology

## 2017-07-07 DIAGNOSIS — D539 Nutritional anemia, unspecified: Secondary | ICD-10-CM

## 2017-07-07 DIAGNOSIS — K7031 Alcoholic cirrhosis of liver with ascites: Secondary | ICD-10-CM

## 2017-07-08 ENCOUNTER — Telehealth: Payer: Self-pay | Admitting: Hematology and Oncology

## 2017-07-08 ENCOUNTER — Inpatient Hospital Stay: Payer: Medicaid Other

## 2017-07-08 ENCOUNTER — Inpatient Hospital Stay: Payer: Medicaid Other | Attending: Hematology and Oncology | Admitting: Hematology and Oncology

## 2017-07-08 DIAGNOSIS — D539 Nutritional anemia, unspecified: Secondary | ICD-10-CM | POA: Diagnosis not present

## 2017-07-08 DIAGNOSIS — F1020 Alcohol dependence, uncomplicated: Secondary | ICD-10-CM | POA: Insufficient documentation

## 2017-07-08 DIAGNOSIS — D61818 Other pancytopenia: Secondary | ICD-10-CM | POA: Diagnosis not present

## 2017-07-08 DIAGNOSIS — K219 Gastro-esophageal reflux disease without esophagitis: Secondary | ICD-10-CM

## 2017-07-08 DIAGNOSIS — K7031 Alcoholic cirrhosis of liver with ascites: Secondary | ICD-10-CM | POA: Diagnosis not present

## 2017-07-08 LAB — FERRITIN: FERRITIN: 333 ng/mL — AB (ref 22–316)

## 2017-07-08 LAB — CBC WITH DIFFERENTIAL/PLATELET
Basophils Absolute: 0.1 10*3/uL (ref 0.0–0.1)
Basophils Relative: 1 %
Eosinophils Absolute: 0.1 10*3/uL (ref 0.0–0.5)
Eosinophils Relative: 1 %
HEMATOCRIT: 24.4 % — AB (ref 38.4–49.9)
HEMOGLOBIN: 8.2 g/dL — AB (ref 13.0–17.1)
LYMPHS ABS: 0.8 10*3/uL — AB (ref 0.9–3.3)
LYMPHS PCT: 8 %
MCH: 38.4 pg — AB (ref 27.2–33.4)
MCHC: 33.7 g/dL (ref 32.0–36.0)
MCV: 113.9 fL — AB (ref 79.3–98.0)
MONOS PCT: 18 %
Monocytes Absolute: 1.8 10*3/uL — ABNORMAL HIGH (ref 0.1–0.9)
NEUTROS ABS: 7.5 10*3/uL — AB (ref 1.5–6.5)
NEUTROS PCT: 72 %
Platelets: 159 10*3/uL (ref 140–400)
RBC: 2.14 MIL/uL — AB (ref 4.20–5.82)
RDW: 15.3 % — ABNORMAL HIGH (ref 11.0–14.6)
WBC: 10.3 10*3/uL (ref 4.0–10.3)

## 2017-07-08 NOTE — Telephone Encounter (Signed)
Gave avs and calendar for march °

## 2017-07-09 ENCOUNTER — Encounter: Payer: Self-pay | Admitting: Hematology and Oncology

## 2017-07-09 MED FILL — XIFAXAN 550 MG TABLET: 550 | 30 days supply | Qty: 60 | Fill #3

## 2017-07-09 MED FILL — TRIAMTERENE-HCTZ 37.5-25 MG: 37.5-25 | 30 days supply | Qty: 30 | Fill #3

## 2017-07-09 NOTE — Assessment & Plan Note (Signed)
His blood count is drastically different compared to his baseline The leukopenia and thrombocytopenia has resolved but now he is profoundly anemic Serum ferritin is still elevated It could be related to recent stress reaction to infection With his blood count being low, I am concerned he might need blood transfusion soon I plan to bring him back next week for further evaluation and workup for the cause of severe anemia The patient has disclosed some symptoms of possible slow GI bleed If it is confirmed that he had possible chronic blood loss, he would need to be evaluated by GI service sooner than later to exclude bleeding varices from liver cirrhosis

## 2017-07-09 NOTE — Assessment & Plan Note (Signed)
The patient had recurrent malignant ascites He is at risk of GI bleed given liver cirrhosis Plan to bring him back next week for further evaluation and management

## 2017-07-09 NOTE — Assessment & Plan Note (Signed)
He has history of reflux disease I reinforced the importance of him taking antiacid medicine

## 2017-07-09 NOTE — Progress Notes (Signed)
[facility] OFFICE PROGRESS NOTE  Osei-Bonsu, Iona Beard, MD  ASSESSMENT & PLAN:  Deficiency anemia His blood count is drastically different compared to his baseline The leukopenia and thrombocytopenia has resolved but now he is profoundly anemic Serum ferritin is still elevated It could be related to recent stress reaction to infection With his blood count being low, I am concerned he might need blood transfusion soon I plan to bring him back next week for further evaluation and workup for the cause of severe anemia The patient has disclosed some symptoms of possible slow GI bleed If it is confirmed that he had possible chronic blood loss, he would need to be evaluated by GI service sooner than later to exclude bleeding varices from liver cirrhosis  Alcoholic cirrhosis of liver with ascites ([place]) The patient had recurrent malignant ascites He is at risk of GI bleed given liver cirrhosis Plan to bring him back next week for further evaluation and management  Gastroesophageal reflux disease without esophagitis He has history of reflux disease I reinforced the importance of him taking antiacid medicine   Orders Placed This Encounter  Procedures  . CBC with Differential/Platelet    Standing Status:   Future    Standing Expiration Date:   08/12/2018  . Vitamin B12    Standing Status:   Future    Standing Expiration Date:   08/12/2018  . Sedimentation rate    Standing Status:   Future    Standing Expiration Date:   08/12/2018  . Reticulocytes ([place])    Standing Status:   Future    Standing Expiration Date:   08/12/2018  . Folate RBC    Standing Status:   Future    Standing Expiration Date:   08/12/2018  . Comprehensive metabolic panel    Standing Status:   Future    Standing Expiration Date:   08/12/2018  . Sample to Blood Bank    Standing Status:   Standing    Number of Occurrences:   33    Standing Expiration Date:   08/12/2018    INTERVAL HISTORY: MISTER KRAHENBUHL 62  y.o. male returns for further follow-up He is being followed here because of pancytopenia and iron overload Since the last time I saw him, he had recurrent malignant ascites requiring paracentesis He complained of acid reflux He has occasional passage of black stool He had frequent nosebleeds He had recent viral infection but is improving He denies recent fever or chills The patient denies any recent signs or symptoms of bleeding such as spontaneous hematuria or hematochezia. He denies recent alcohol intake  SUMMARY OF HEMATOLOGIC HISTORY:  He was found to have abnormal CBC from recent blood work. This patient have chronic untreated hepatitis C, liver disease with cirrhosis and chronic alcoholism. He was recently discharged from the hospital with acute encephalopathy which improved upon supportive care management. He denies recent bruising/bleeding, such as spontaneous epistaxis, hematuria, melena or hematochezia The patient had poor social circumstances. He lives in a group home/facility. He continues to drink on a regular basis, last alcohol intake was over a day ago. He has not been treated for hepatitis C, presumably due to noncompliance and chronic alcoholism. He has chronic right lower abdominal pain from abdominal hernia but that was not surgically repaired due to chronic thrombocytopenia In May, he had extensive workup including liver biopsy which excluded hepatoma Starting September 2017, he had regular phlebotomy every 3 weeks Starting March 2018, the frequency of phlebotomy is reduced  to every 3 months due to  reduced ferritin level  I have reviewed the past medical history, past surgical history, social history and family history with the patient and they are unchanged from previous note.  ALLERGIES:  has No Known Allergies.  MEDICATIONS:  Current Outpatient Medications  Medication Sig Dispense Refill  . amoxicillin (AMOXIL) 500 MG capsule Take 1 capsule (500 mg total) by  mouth 3 (three) times daily. 21 capsule 0  . Cholecalciferol (VITAMIN D3 PO) Take 1 capsule by mouth daily.    . fluticasone (FLONASE) 50 MCG/ACT nasal spray Place 1 spray into both nostrils daily.     . furosemide (LASIX) 40 MG tablet Take 1 tablet (40 mg total) by mouth daily. (Patient not taking: Reported on 05/11/2017) 30 tablet 2  . ibuprofen (ADVIL,MOTRIN) 200 MG tablet Take 200 mg by mouth every 6 (six) hours as needed for headache or mild pain.     Marland Kitchen lactulose (CHRONULAC) 10 GM/15ML solution Take 45 mLs (30 g total) by mouth 4 (four) times daily. (Patient taking differently: Take 30 g by mouth 3 (three) times daily. ) 500 mL 2  . omeprazole (PRILOSEC) 20 MG capsule Take 1 capsule (20 mg total) by mouth at bedtime. 30 capsule 11  . oxyCODONE-acetaminophen (PERCOCET) 5-325 MG tablet Take 1 tablet by mouth every 4 (four) hours as needed for severe pain. 20 tablet 0  . PROAIR HFA 108 (90 Base) MCG/ACT inhaler Inhale 2 puffs into the lungs 4 (four) times daily as needed for shortness of breath or wheezing.  3  . rifaximin (XIFAXAN) 550 MG TABS tablet Take 1 tablet (550 mg total) by mouth 2 (two) times daily. 60 tablet 6  . spironolactone (ALDACTONE) 100 MG tablet TAKE 1 TABLET (100 MG TOTAL) BY MOUTH DAILY. (Patient not taking: Reported on 05/11/2017) 30 tablet 2  . triamterene-hydrochlorothiazide (MAXZIDE-25) 37.5-25 MG tablet Take 1 tablet by mouth daily.    Marland Kitchen VITAMIN A PO Take 1 tablet by mouth daily.     No current facility-administered medications for this visit.      REVIEW OF SYSTEMS:   Constitutional: Denies fevers, chills or night sweats Eyes: Denies blurriness of vision Ears, nose, mouth, throat, and face: Denies mucositis or sore throat Respiratory: Denies cough, dyspnea or wheezes Cardiovascular: Denies palpitation, chest discomfort or lower extremity swelling Skin: Denies abnormal skin rashes Lymphatics: Denies new lymphadenopathy or easy bruising Neurological:Denies numbness,  tingling or new weaknesses Behavioral/Psych: Mood is stable, no new changes  All other systems were reviewed with the patient and are negative.  PHYSICAL EXAMINATION: ECOG PERFORMANCE STATUS: 1 - Symptomatic but completely ambulatory  Vitals:   07/08/17 1107  BP: 125/79  Pulse: 65  Resp: 18  Temp: (!) 97.4 F (36.3 C)  SpO2: 100%   Filed Weights   07/08/17 1107  Weight: 181 lb 4.8 oz (82.2 kg)    GENERAL:alert, no distress and comfortable SKIN: skin color, texture, turgor are normal, no rashes or significant lesions EYES: normal, Conjunctiva are pink and non-injected, sclera clear OROPHARYNX:no exudate, no erythema and lips, buccal mucosa, and tongue normal  NECK: supple, thyroid normal size, non-tender, without nodularity LYMPH:  no palpable lymphadenopathy in the cervical, axillary or inguinal LUNGS: clear to auscultation and percussion with normal breathing effort HEART: regular rate & rhythm and no murmurs and no lower extremity edema ABDOMEN:abdomen soft, mild distention with ascites Musculoskeletal:no cyanosis of digits and no clubbing  NEURO: alert & oriented x 3 with fluent speech, no focal  motor/sensory deficits  LABORATORY DATA:  I have reviewed the data as listed     Component Value Date/Time   NA 137 05/15/2017 0634   NA 137 01/04/2016 0809   K 4.4 05/15/2017 0634   K 4.5 01/04/2016 0809   CL 106 05/15/2017 0634   CO2 24 05/15/2017 0634   CO2 18 (L) 01/04/2016 0809   GLUCOSE 108 (H) 05/15/2017 0634   GLUCOSE 109 01/04/2016 0809   BUN 10 05/15/2017 0634   BUN 14.5 01/04/2016 0809   CREATININE 1.09 05/15/2017 0634   CREATININE 1.03 03/25/2017 1515   CREATININE 1.0 01/04/2016 0809   CALCIUM 8.1 (L) 05/15/2017 0634   CALCIUM 8.3 (L) 01/04/2016 0809   PROT 5.9 (L) 03/25/2017 1515   PROT 6.8 01/04/2016 0809   ALBUMIN 2.6 (L) 02/20/2017 2248   ALBUMIN 2.3 (L) 01/04/2016 0809   AST 60 (H) 03/25/2017 1515   AST 98 (H) 01/04/2016 0809   ALT 28 03/25/2017  1515   ALT 52 01/04/2016 0809   ALKPHOS 177 (H) 02/20/2017 2248   ALKPHOS 231 (H) 01/04/2016 0809   BILITOT 2.1 (H) 03/25/2017 1515   BILITOT 2.10 (H) 01/04/2016 0809   GFRNONAA >60 05/15/2017 0634   GFRNONAA 79 03/25/2017 1515   GFRAA >60 05/15/2017 0634   GFRAA 91 03/25/2017 1515    No results found for: SPEP, UPEP  Lab Results  Component Value Date   WBC 10.3 07/08/2017   NEUTROABS 7.5 (H) 07/08/2017   HGB 8.2 (L) 07/08/2017   HCT 24.4 (L) 07/08/2017   MCV 113.9 (H) 07/08/2017   PLT 159 07/08/2017      Chemistry      Component Value Date/Time   NA 137 05/15/2017 0634   NA 137 01/04/2016 0809   K 4.4 05/15/2017 0634   K 4.5 01/04/2016 0809   CL 106 05/15/2017 0634   CO2 24 05/15/2017 0634   CO2 18 (L) 01/04/2016 0809   BUN 10 05/15/2017 0634   BUN 14.5 01/04/2016 0809   CREATININE 1.09 05/15/2017 0634   CREATININE 1.03 03/25/2017 1515   CREATININE 1.0 01/04/2016 0809   GLU 117 09/12/2015      Component Value Date/Time   CALCIUM 8.1 (L) 05/15/2017 0634   CALCIUM 8.3 (L) 01/04/2016 0809   ALKPHOS 177 (H) 02/20/2017 2248   ALKPHOS 231 (H) 01/04/2016 0809   AST 60 (H) 03/25/2017 1515   AST 98 (H) 01/04/2016 0809   ALT 28 03/25/2017 1515   ALT 52 01/04/2016 0809   BILITOT 2.1 (H) 03/25/2017 1515   BILITOT 2.10 (H) 01/04/2016 0809      I spent 15 minutes counseling the patient face to face. The total time spent in the appointment was 20 minutes and more than 50% was on counseling.   All questions were answered. The patient knows to call the clinic with any problems, questions or concerns. No barriers to learning was detected.    Heath Lark, MD 3/14/201911:10 AM

## 2017-07-12 ENCOUNTER — Observation Stay (HOSPITAL_COMMUNITY): Payer: Medicaid Other

## 2017-07-12 ENCOUNTER — Encounter (HOSPITAL_COMMUNITY): Payer: Self-pay | Admitting: Emergency Medicine

## 2017-07-12 ENCOUNTER — Other Ambulatory Visit: Payer: Self-pay

## 2017-07-12 ENCOUNTER — Inpatient Hospital Stay (HOSPITAL_COMMUNITY)
Admission: EM | Admit: 2017-07-12 | Discharge: 2017-07-15 | DRG: 811 | Disposition: A | Discharge status: Home / Self Care | Payer: Medicaid Other | Attending: Internal Medicine | Admitting: Internal Medicine

## 2017-07-12 DIAGNOSIS — D638 Anemia in other chronic diseases classified elsewhere: Secondary | ICD-10-CM | POA: Diagnosis present

## 2017-07-12 DIAGNOSIS — N5089 Other specified disorders of the male genital organs: Secondary | ICD-10-CM | POA: Diagnosis present

## 2017-07-12 DIAGNOSIS — K7290 Hepatic failure, unspecified without coma: Secondary | ICD-10-CM | POA: Diagnosis not present

## 2017-07-12 DIAGNOSIS — H9190 Unspecified hearing loss, unspecified ear: Secondary | ICD-10-CM | POA: Diagnosis present

## 2017-07-12 DIAGNOSIS — B182 Chronic viral hepatitis C: Secondary | ICD-10-CM | POA: Diagnosis present

## 2017-07-12 DIAGNOSIS — D539 Nutritional anemia, unspecified: Secondary | ICD-10-CM | POA: Diagnosis present

## 2017-07-12 DIAGNOSIS — Z79899 Other long term (current) drug therapy: Secondary | ICD-10-CM

## 2017-07-12 DIAGNOSIS — R160 Hepatomegaly, not elsewhere classified: Secondary | ICD-10-CM | POA: Diagnosis present

## 2017-07-12 DIAGNOSIS — K08499 Partial loss of teeth due to other specified cause, unspecified class: Secondary | ICD-10-CM | POA: Diagnosis present

## 2017-07-12 DIAGNOSIS — E860 Dehydration: Secondary | ICD-10-CM | POA: Diagnosis present

## 2017-07-12 DIAGNOSIS — K7460 Unspecified cirrhosis of liver: Secondary | ICD-10-CM

## 2017-07-12 DIAGNOSIS — K029 Dental caries, unspecified: Secondary | ICD-10-CM | POA: Diagnosis present

## 2017-07-12 DIAGNOSIS — K7011 Alcoholic hepatitis with ascites: Secondary | ICD-10-CM | POA: Diagnosis present

## 2017-07-12 DIAGNOSIS — Z6829 Body mass index (BMI) 29.0-29.9, adult: Secondary | ICD-10-CM

## 2017-07-12 DIAGNOSIS — K7040 Alcoholic hepatic failure without coma: Secondary | ICD-10-CM | POA: Diagnosis present

## 2017-07-12 DIAGNOSIS — D649 Anemia, unspecified: Secondary | ICD-10-CM | POA: Diagnosis present

## 2017-07-12 DIAGNOSIS — R202 Paresthesia of skin: Secondary | ICD-10-CM | POA: Diagnosis present

## 2017-07-12 DIAGNOSIS — E43 Unspecified severe protein-calorie malnutrition: Secondary | ICD-10-CM | POA: Diagnosis present

## 2017-07-12 DIAGNOSIS — K7030 Alcoholic cirrhosis of liver without ascites: Secondary | ICD-10-CM | POA: Diagnosis not present

## 2017-07-12 DIAGNOSIS — Z7141 Alcohol abuse counseling and surveillance of alcoholic: Secondary | ICD-10-CM

## 2017-07-12 DIAGNOSIS — Z7151 Drug abuse counseling and surveillance of drug abuser: Secondary | ICD-10-CM

## 2017-07-12 DIAGNOSIS — K267 Chronic duodenal ulcer without hemorrhage or perforation: Secondary | ICD-10-CM | POA: Diagnosis present

## 2017-07-12 DIAGNOSIS — I4581 Long QT syndrome: Secondary | ICD-10-CM | POA: Diagnosis present

## 2017-07-12 DIAGNOSIS — N179 Acute kidney failure, unspecified: Secondary | ICD-10-CM | POA: Diagnosis present

## 2017-07-12 DIAGNOSIS — E559 Vitamin D deficiency, unspecified: Secondary | ICD-10-CM | POA: Diagnosis present

## 2017-07-12 DIAGNOSIS — K4090 Unilateral inguinal hernia, without obstruction or gangrene, not specified as recurrent: Secondary | ICD-10-CM

## 2017-07-12 DIAGNOSIS — D696 Thrombocytopenia, unspecified: Secondary | ICD-10-CM

## 2017-07-12 DIAGNOSIS — D61818 Other pancytopenia: Secondary | ICD-10-CM | POA: Diagnosis present

## 2017-07-12 DIAGNOSIS — K259 Gastric ulcer, unspecified as acute or chronic, without hemorrhage or perforation: Secondary | ICD-10-CM | POA: Diagnosis present

## 2017-07-12 DIAGNOSIS — R945 Abnormal results of liver function studies: Secondary | ICD-10-CM

## 2017-07-12 DIAGNOSIS — Z833 Family history of diabetes mellitus: Secondary | ICD-10-CM

## 2017-07-12 DIAGNOSIS — F1410 Cocaine abuse, uncomplicated: Secondary | ICD-10-CM | POA: Diagnosis present

## 2017-07-12 DIAGNOSIS — K7210 Chronic hepatic failure without coma: Secondary | ICD-10-CM | POA: Diagnosis present

## 2017-07-12 DIAGNOSIS — Z8249 Family history of ischemic heart disease and other diseases of the circulatory system: Secondary | ICD-10-CM

## 2017-07-12 DIAGNOSIS — R7989 Other specified abnormal findings of blood chemistry: Secondary | ICD-10-CM | POA: Diagnosis present

## 2017-07-12 DIAGNOSIS — I10 Essential (primary) hypertension: Secondary | ICD-10-CM | POA: Diagnosis present

## 2017-07-12 DIAGNOSIS — D6959 Other secondary thrombocytopenia: Secondary | ICD-10-CM | POA: Diagnosis present

## 2017-07-12 DIAGNOSIS — Z803 Family history of malignant neoplasm of breast: Secondary | ICD-10-CM

## 2017-07-12 DIAGNOSIS — F1020 Alcohol dependence, uncomplicated: Secondary | ICD-10-CM | POA: Diagnosis present

## 2017-07-12 DIAGNOSIS — D684 Acquired coagulation factor deficiency: Secondary | ICD-10-CM | POA: Diagnosis present

## 2017-07-12 DIAGNOSIS — F17210 Nicotine dependence, cigarettes, uncomplicated: Secondary | ICD-10-CM | POA: Diagnosis present

## 2017-07-12 DIAGNOSIS — K7031 Alcoholic cirrhosis of liver with ascites: Secondary | ICD-10-CM | POA: Diagnosis present

## 2017-07-12 DIAGNOSIS — D509 Iron deficiency anemia, unspecified: Principal | ICD-10-CM | POA: Diagnosis present

## 2017-07-12 DIAGNOSIS — E871 Hypo-osmolality and hyponatremia: Secondary | ICD-10-CM

## 2017-07-12 DIAGNOSIS — I8510 Secondary esophageal varices without bleeding: Secondary | ICD-10-CM | POA: Diagnosis present

## 2017-07-12 LAB — SODIUM, URINE, RANDOM: SODIUM UR: 11 mmol/L

## 2017-07-12 LAB — I-STAT CHEM 8, ED
BUN: 41 mg/dL — AB (ref 6–20)
CHLORIDE: 92 mmol/L — AB (ref 101–111)
CREATININE: 1.7 mg/dL — AB (ref 0.61–1.24)
Calcium, Ion: 1.03 mmol/L — ABNORMAL LOW (ref 1.15–1.40)
Glucose, Bld: 122 mg/dL — ABNORMAL HIGH (ref 65–99)
HEMATOCRIT: 24 % — AB (ref 39.0–52.0)
Hemoglobin: 8.2 g/dL — ABNORMAL LOW (ref 13.0–17.0)
Potassium: 3.7 mmol/L (ref 3.5–5.1)
SODIUM: 125 mmol/L — AB (ref 135–145)
TCO2: 22 mmol/L (ref 22–32)

## 2017-07-12 LAB — IRON AND TIBC
IRON: 20 ug/dL — AB (ref 45–182)
SATURATION RATIOS: 10 % — AB (ref 17.9–39.5)
TIBC: 196 ug/dL — AB (ref 250–450)
UIBC: 176 ug/dL

## 2017-07-12 LAB — URINALYSIS, ROUTINE W REFLEX MICROSCOPIC
BILIRUBIN URINE: NEGATIVE
Glucose, UA: NEGATIVE mg/dL
Ketones, ur: NEGATIVE mg/dL
LEUKOCYTES UA: NEGATIVE
Nitrite: NEGATIVE
PROTEIN: NEGATIVE mg/dL
SPECIFIC GRAVITY, URINE: 1.018 (ref 1.005–1.030)
pH: 5 (ref 5.0–8.0)

## 2017-07-12 LAB — RAPID URINE DRUG SCREEN, HOSP PERFORMED
Amphetamines: NOT DETECTED
Barbiturates: NOT DETECTED
Benzodiazepines: NOT DETECTED
COCAINE: POSITIVE — AB
OPIATES: NOT DETECTED
TETRAHYDROCANNABINOL: NOT DETECTED

## 2017-07-12 LAB — COMPREHENSIVE METABOLIC PANEL
ALBUMIN: 2.1 g/dL — AB (ref 3.5–5.0)
ALK PHOS: 159 U/L — AB (ref 38–126)
ALT: 84 U/L — ABNORMAL HIGH (ref 17–63)
AST: 116 U/L — AB (ref 15–41)
Anion gap: 9 (ref 5–15)
BILIRUBIN TOTAL: 2.4 mg/dL — AB (ref 0.3–1.2)
BUN: 43 mg/dL — AB (ref 6–20)
CALCIUM: 7.7 mg/dL — AB (ref 8.9–10.3)
CO2: 21 mmol/L — AB (ref 22–32)
Chloride: 94 mmol/L — ABNORMAL LOW (ref 101–111)
Creatinine, Ser: 1.64 mg/dL — ABNORMAL HIGH (ref 0.61–1.24)
GFR calc Af Amer: 51 mL/min — ABNORMAL LOW (ref >60)
GFR calc non Af Amer: 44 mL/min — ABNORMAL LOW (ref >60)
GLUCOSE: 127 mg/dL — AB (ref 65–99)
Potassium: 3.6 mmol/L (ref 3.5–5.1)
SODIUM: 124 mmol/L — AB (ref 135–145)
TOTAL PROTEIN: 5.4 g/dL — AB (ref 6.5–8.1)

## 2017-07-12 LAB — CK: Total CK: 221 U/L (ref 49–397)

## 2017-07-12 LAB — CBC
HEMATOCRIT: 21.3 % — AB (ref 39.0–52.0)
HEMOGLOBIN: 7.5 g/dL — AB (ref 13.0–17.0)
MCH: 37.5 pg — ABNORMAL HIGH (ref 26.0–34.0)
MCHC: 35.2 g/dL (ref 30.0–36.0)
MCV: 106.5 fL — ABNORMAL HIGH (ref 78.0–100.0)
Platelets: 120 10*3/uL — ABNORMAL LOW (ref 150–400)
RBC: 2 MIL/uL — AB (ref 4.22–5.81)
RDW: 16 % — ABNORMAL HIGH (ref 11.5–15.5)
WBC: 6.4 10*3/uL (ref 4.0–10.5)

## 2017-07-12 LAB — LIPASE, BLOOD: Lipase: 43 U/L (ref 11–51)

## 2017-07-12 LAB — ABO/RH: ABO/RH(D): O POS

## 2017-07-12 LAB — VITAMIN B12: Vitamin B-12: 3961 pg/mL — ABNORMAL HIGH (ref 180–914)

## 2017-07-12 LAB — AMMONIA: Ammonia: 66 umol/L — ABNORMAL HIGH (ref 9–35)

## 2017-07-12 LAB — I-STAT TROPONIN, ED: Troponin i, poc: 0.01 ng/mL (ref 0.00–0.08)

## 2017-07-12 LAB — TSH: TSH: 3.886 u[IU]/mL (ref 0.350–4.500)

## 2017-07-12 LAB — CREATININE, URINE, RANDOM: Creatinine, Urine: 186.99 mg/dL

## 2017-07-12 LAB — PROTIME-INR
INR: 1.62
Prothrombin Time: 19.1 seconds — ABNORMAL HIGH (ref 11.4–15.2)

## 2017-07-12 LAB — POC OCCULT BLOOD, ED: Fecal Occult Bld: NEGATIVE

## 2017-07-12 LAB — MAGNESIUM: MAGNESIUM: 2.2 mg/dL (ref 1.7–2.4)

## 2017-07-12 MED ORDER — PANTOPRAZOLE SODIUM 40 MG PO TBEC
40.0000 mg | DELAYED_RELEASE_TABLET | Freq: Every day | ORAL | Status: DC
  Administered 2017-07-12 – 2017-07-13 (×2): 40 mg via ORAL
  Filled 2017-07-12 (×2): qty 1

## 2017-07-12 MED ORDER — SODIUM CHLORIDE 0.9 % IV BOLUS (SEPSIS): 1000.0000 mL | Freq: Once | INTRAVENOUS | Status: DC

## 2017-07-12 MED ORDER — LACTULOSE 10 GM/15ML PO SOLN
30.0000 g | Freq: Four times a day (QID) | ORAL | Status: DC
  Administered 2017-07-12 – 2017-07-15 (×11): 30 g via ORAL
  Filled 2017-07-12 (×11): qty 45

## 2017-07-12 MED ORDER — ONDANSETRON HCL 4 MG/2ML IJ SOLN
4.0000 mg | Freq: Once | INTRAMUSCULAR | Status: AC
  Administered 2017-07-12: 4 mg via INTRAVENOUS
  Filled 2017-07-12: qty 2

## 2017-07-12 MED ORDER — RIFAXIMIN 550 MG PO TABS
550.0000 mg | ORAL_TABLET | Freq: Two times a day (BID) | ORAL | Status: DC
  Administered 2017-07-12 – 2017-07-15 (×7): 550 mg via ORAL
  Filled 2017-07-12 (×8): qty 1

## 2017-07-12 MED ORDER — ALBUTEROL SULFATE (2.5 MG/3ML) 0.083% IN NEBU: 2.5000 mg | INHALATION_SOLUTION | Freq: Four times a day (QID) | RESPIRATORY_TRACT | Status: DC | PRN

## 2017-07-12 MED ORDER — THIAMINE HCL 100 MG/ML IJ SOLN
Freq: Once | INTRAVENOUS | Status: AC
  Administered 2017-07-12: 09:00:00 via INTRAVENOUS
  Filled 2017-07-12: qty 1000

## 2017-07-12 NOTE — ED Notes (Signed)
ED TO INPATIENT HANDOFF REPORT  Name/Age/Gender Jose [name] 62 y.o. male  Code Status Code Status History    Date Active Date Inactive Code Status Order ID Comments User Context   08/14/2016 15:47 08/18/2016 19:03 Full Code [id number]  Samella Parr, NP Inpatient   07/30/2016 19:18 08/06/2016 20:21 Full Code [id number]  Domenic Polite, MD Inpatient   07/15/2016 20:09 07/18/2016 17:40 Full Code [id number]  Etta Quill, DO ED   06/06/2016 15:49 06/10/2016 20:56 Full Code [id number]  Edwin Dada, MD Inpatient   05/10/2016 23:08 05/11/2016 21:36 Full Code [id number]  Ivor Costa, MD ED   04/26/2016 12:28 04/28/2016 16:38 Full Code [id number]  Cristal Ford, DO ED   09/04/2015 01:11 09/07/2015 16:45 Full Code [id number]  Rise Patience, MD Inpatient   07/23/2015 22:53 07/29/2015 16:07 Full Code [id number]  Vianne Bulls, MD ED   06/26/2015 23:49 06/29/2015 18:15 Full Code [id number]  Theressa Millard, MD Inpatient   04/26/2013 06:28 04/29/2013 18:47 Full Code [id number]  Bynum Bellows, MD ED      Home/SNF/Other Home  Chief Complaint muscle cramps  Level of Care/Admitting Diagnosis ED Disposition    ED Disposition Condition [facility] Area: [facility] [[id number]]  Level of Care: Telemetry [5]  Admit to tele based on following criteria: Other see comments  Comments: SYMPTOMATIC ANEMIA  Diagnosis: Symptomatic anemia [7793903]  Admitting Physician: Hosie Poisson [[id number]]  Attending Physician: Hosie Poisson [[id number]]  PT Class (Do Not Modify): Observation [104]  PT Acc Code (Do Not Modify): Observation [10022]       Medical History Past Medical History:  Diagnosis Date  . Alcohol abuse   . Alcohol dependence ([place])   . Alcoholism ([place]) 07/12/2015  . Ascites   . Cirrhosis ([place])   . Depression   . 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 Hepatitis C    Hepatitis C  . HOH (hard of hearing)   .  Hypertension   . Inguinal hernia    right  . QT prolongation   . Shortness of breath    with exertion  . Thrombocytopenia ([place])     Allergies No Known Allergies  IV Location/Drains/Wounds Patient Lines/Drains/Airways Status   Active Line/Drains/Airways    Name:   Placement date:   Placement time:   Site:   Days:   Peripheral IV 07/12/17 Right;Upper Forearm   07/12/17    0910    Forearm   less than 1   Incision (Closed) 05/15/17 N/A Other (Comment)   05/15/17    0904     58          Labs/Imaging Results for orders placed or performed during the hospital encounter of 07/12/17 (from the past 48 hour(s))  Lipase, blood     Status: None   Collection Time: 07/12/17  8:00 AM  Result Value Ref Range   Lipase 43 11 - 51 U/L    Comment: Performed at [facility], [place] [address] [place], [place] [address]  CBC     Status: Abnormal   Collection Time: 07/12/17  8:22 AM  Result Value Ref Range   WBC 6.4 4.0 - 10.5 K/uL   RBC 2.00 (L) 4.22 - 5.81 MIL/uL   Hemoglobin 7.5 (L) 13.0 - 17.0 g/dL   HCT 21.3 (L) 39.0 - 52.0 %   MCV 106.5 (H) 78.0 - 100.0  fL   MCH 37.5 (H) 26.0 - 34.0 pg   MCHC 35.2 30.0 - 36.0 g/dL   RDW 16.0 (H) 11.5 - 15.5 %   Platelets 120 (L) 150 - 400 K/uL    Comment: Performed at [facility], [place] [address], [place], [place] [address]  Comprehensive metabolic panel     Status: Abnormal   Collection Time: 07/12/17  8:22 AM  Result Value Ref Range   Sodium 124 (L) 135 - 145 mmol/L   Potassium 3.6 3.5 - 5.1 mmol/L   Chloride 94 (L) 101 - 111 mmol/L   CO2 21 (L) 22 - 32 mmol/L   Glucose, Bld 127 (H) 65 - 99 mg/dL   BUN 43 (H) 6 - 20 mg/dL   Creatinine, Ser 1.64 (H) 0.61 - 1.24 mg/dL   Calcium 7.7 (L) 8.9 - 10.3 mg/dL   Total Protein 5.4 (L) 6.5 - 8.1 g/dL   Albumin 2.1 (L) 3.5 - 5.0 g/dL   AST 116 (H) 15 - 41 U/L   ALT 84 (H) 17 - 63 U/L   Alkaline Phosphatase 159 (H) 38 - 126 U/L   Total Bilirubin 2.4 (H) 0.3 - 1.2 mg/dL    GFR calc non Af Amer 44 (L) >60 mL/min   GFR calc Af Amer 51 (L) >60 mL/min    Comment: (NOTE) The eGFR has been calculated using the CKD EPI equation. This calculation has not been validated in all clinical situations. eGFR's persistently <60 mL/min signify possible Chronic Kidney Disease.    Anion gap 9 5 - 15    Comment: Performed at [facility], [place] [address] [place], [place] [address]  CK     Status: None   Collection Time: 07/12/17  8:22 AM  Result Value Ref Range   Total CK 221 49 - 397 U/L    Comment: Performed at [facility], [place] [address], [place], [place] [address]  Rapid urine drug screen (hospital performed)     Status: Abnormal   Collection Time: 07/12/17  8:25 AM  Result Value Ref Range   Opiates NONE DETECTED NONE DETECTED   Cocaine POSITIVE (A) NONE DETECTED   Benzodiazepines NONE DETECTED NONE DETECTED   Amphetamines NONE DETECTED NONE DETECTED   Tetrahydrocannabinol NONE DETECTED NONE DETECTED   Barbiturates NONE DETECTED NONE DETECTED    Comment: (NOTE) DRUG SCREEN FOR MEDICAL PURPOSES ONLY.  IF CONFIRMATION IS NEEDED FOR ANY PURPOSE, NOTIFY LAB WITHIN 5 DAYS. LOWEST DETECTABLE LIMITS FOR URINE DRUG SCREEN Drug Class                     Cutoff (ng/mL) Amphetamine and metabolites    1000 Barbiturate and metabolites    200 Benzodiazepine                 638 Tricyclics and metabolites     300 Opiates and metabolites        300 Cocaine and metabolites        300 THC                            50 Performed at [facility], [place] [address] [place], [place] [address]   I-Stat Troponin, ED (not at [facility])     Status: None   Collection Time: 07/12/17  8:27 AM  Result Value Ref Range   Troponin i, poc 0.01 0.00 - 0.08 ng/mL  Comment 3            Comment: Due to the release kinetics of cTnI, a negative result within the first hours of the onset of symptoms does not rule out myocardial infarction  with certainty. If myocardial infarction is still suspected, repeat the test at appropriate intervals.   I-Stat Chem 8, ED     Status: Abnormal   Collection Time: 07/12/17  8:29 AM  Result Value Ref Range   Sodium 125 (L) 135 - 145 mmol/L   Potassium 3.7 3.5 - 5.1 mmol/L   Chloride 92 (L) 101 - 111 mmol/L   BUN 41 (H) 6 - 20 mg/dL   Creatinine, Ser 1.70 (H) 0.61 - 1.24 mg/dL   Glucose, Bld 122 (H) 65 - 99 mg/dL   Calcium, Ion 1.03 (L) 1.15 - 1.40 mmol/L   TCO2 22 22 - 32 mmol/L   Hemoglobin 8.2 (L) 13.0 - 17.0 g/dL   HCT 24.0 (L) 39.0 - 52.0 %  Urinalysis, Routine w reflex microscopic     Status: Abnormal   Collection Time: 07/12/17  8:35 AM  Result Value Ref Range   Color, Urine AMBER (A) YELLOW    Comment: BIOCHEMICALS MAY BE AFFECTED BY COLOR   APPearance CLEAR CLEAR   Specific Gravity, Urine 1.018 1.005 - 1.030   pH 5.0 5.0 - 8.0   Glucose, UA NEGATIVE NEGATIVE mg/dL   Hgb urine dipstick SMALL (A) NEGATIVE   Bilirubin Urine NEGATIVE NEGATIVE   Ketones, ur NEGATIVE NEGATIVE mg/dL   Protein, ur NEGATIVE NEGATIVE mg/dL   Nitrite NEGATIVE NEGATIVE   Leukocytes, UA NEGATIVE NEGATIVE   RBC / HPF 0-5 0 - 5 RBC/hpf   WBC, UA 0-5 0 - 5 WBC/hpf   Bacteria, UA RARE (A) NONE SEEN   Squamous Epithelial / LPF 0-5 (A) NONE SEEN   Mucus PRESENT    Hyaline Casts, UA PRESENT     Comment: Performed at [facility], [place] [address] [place], [place] [address]  Magnesium     Status: None   Collection Time: 07/12/17  8:54 AM  Result Value Ref Range   Magnesium 2.2 1.7 - 2.4 mg/dL    Comment: Performed at [facility], [place] [address] [place], [place] [address]  Protime-INR     Status: Abnormal   Collection Time: 07/12/17  8:54 AM  Result Value Ref Range   Prothrombin Time 19.1 (H) 11.4 - 15.2 seconds   INR 1.62     Comment: Performed at [facility], [place] [address], [place], [place] [address]  Type and screen [place]     Status: None   Collection Time: 07/12/17  9:33 AM  Result Value Ref Range   ABO/RH(D) O POS    Antibody Screen NEG    Sample Expiration      07/15/2017 Performed at [facility], [place] [address], [place], [place] [address]   POC occult blood, ED Provider will collect     Status: None   Collection Time: 07/12/17 10:46 AM  Result Value Ref Range   Fecal Occult Bld NEGATIVE NEGATIVE   No results found.  Pending Labs Unresulted Labs (From admission, onward)   Start     Ordered   07/13/17 0500  Cortisol  Tomorrow morning,   R     07/12/17 1027   07/13/17 0500  CBC  Tomorrow morning,   R     07/12/17 1028  07/13/17 0500  Comprehensive metabolic panel  Tomorrow morning,   R     07/12/17 1028   07/12/17 1031  Occult blood card to lab, stool  STAT,   R     07/12/17 1030   07/12/17 1028  Creatinine, urine, random  Once,   R     07/12/17 1027   07/12/17 1028  Sodium, urine, random  Once,   R     07/12/17 1027   07/12/17 1028  TSH  Add-on,   R     07/12/17 1027   07/12/17 0930  ABO/Rh  Once,   R     07/12/17 0930      Vitals/Pain Today's Vitals   07/12/17 0737 07/12/17 0824 07/12/17 0836 07/12/17 0848  BP:  123/74    Pulse:  (!) 45 (!) 50 (!) 52  Resp:   13 17  Temp:      TempSrc:      SpO2:  97% 100% 100%  PainSc: 9        Isolation Precautions No active isolations  Medications Medications  sodium chloride 0.9 % 1,000 mL with thiamine 505 mg, folic acid 1 mg, multivitamins adult 10 mL infusion ( Intravenous New Bag/Given 07/12/17 0928)  ondansetron (ZOFRAN) injection 4 mg (4 mg Intravenous Given 07/12/17 1009)    Mobility walks

## 2017-07-12 NOTE — ED Provider Notes (Signed)
[facility] DEPT Provider Note   CSN: [id number] Arrival date & time: 07/12/17  0334     History   Chief Complaint Chief Complaint  Patient presents with  . Muscle Cramping    HPI Jose [name] is a 62 y.o. male presenting for evaluation of muscle cramps.  Patient states for the past 2 days he has had generalized muscle cramps.  It began in his arms, but is now over his whole body.  Patient states his hemoglobin has been low, so his doctor has been making him take iron pills, he started these recently.  He denies other change in medications recently.  He denies recent fall, trauma, injury, or extended.  Lying on the ground.  He denies history of similar cramping.  He denies fever, chills, cough, chest pain, nausea, vomiting, abdominal pain, urinary symptoms, abnormal bowel movements.  He states that he used cocaine last night.  He smokes cigarettes occasionally, states he has not had anything to drink since last Friday.    Per chart review, patient with extensive medical history including iron deficiency anemia, alcoholic cirrhosis, esophageal varices, high risk for GI bleed and recent worsening of anemia and thrombocytopenia.   Pt does not have a GI doctor.  No one is managing his liver failure.  HPI  Past Medical History:  Diagnosis Date  . Alcohol abuse   . Alcohol dependence ([place])   . Alcoholism ([place]) 07/12/2015  . Ascites   . Cirrhosis ([place])   . Depression   . 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 Hepatitis C    Hepatitis C  . HOH (hard of hearing)   . Hypertension   . Inguinal hernia    right  . QT prolongation   . Shortness of breath    with exertion  . Thrombocytopenia [facility])     Patient Active Problem List   Diagnosis Date Noted  . Deficiency anemia 07/08/2017  . Smoker 01/08/2017  . Pancytopenia, acquired ([place]) 01/08/2017  . Pain in right leg 10/06/2016  . End stage liver disease  ([place]) 09/10/2016  . Palliative care encounter   . 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 Hepatitis C, chronic ([place]) 01/04/2016  . Right inguinal hernia 09/12/2015  . Essential hypertension 09/12/2015  . Gastroesophageal reflux disease without esophagitis 09/12/2015  . Protein-calorie malnutrition, severe ([place]) 09/12/2015  . Anemia of chronic disease 09/12/2015  . Arthritis of left knee 09/12/2015  . Elevated LFTs   . Alcoholic cirrhosis of liver with ascites ([place]) 07/25/2015  . Macrocytic anemia 07/23/2015  . Alcoholism ([place]) 07/12/2015  . Hepatic encephalopathy ([place]) 06/26/2015  . Adenomatous polyps 10/18/2013  . Unspecified vitamin D deficiency 07/13/2013  . Atopic dermatitis 04/28/2013  . Thrombocytopenia ([place]) 04/28/2013    Past Surgical History:  Procedure Laterality Date  . CIRCUMCISION    . COLONOSCOPY  march 2015   Dr. Renee Harder: nodular mucosa at appendiceal orifice, tubular adenoma, extremely poor prep  . COLONOSCOPY    . TOOTH EXTRACTION N/A 05/15/2017   Procedure: DENTAL RESTORATION and multiple teeth EXTRACTIONS;  Surgeon: Diona Browner, DDS;  Location: [facility];  Service: Oral Surgery;  Laterality: N/A;       Home Medications    Prior to Admission medications   Medication Sig Start Date End Date Taking? Authorizing Provider  amoxicillin (AMOXIL) 500 MG capsule Take 1 capsule (500 mg total) by mouth 3 (three) times daily. 05/15/17  Diona Browner, DDS  Cholecalciferol (VITAMIN D3 PO) Take 1 capsule by mouth daily.    [provider]  fluticasone (FLONASE) 50 MCG/ACT nasal spray Place 1 spray into both nostrils daily.     [provider]  furosemide (LASIX) 40 MG tablet Take 1 tablet (40 mg total) by mouth daily. Patient not taking: Reported on 05/11/2017 11/14/16   Doran Stabler, MD  ibuprofen (ADVIL,MOTRIN) 200 MG tablet Take 200 mg by mouth every 6 (six) hours as needed for headache or mild pain.     [provider]  lactulose (CHRONULAC) 10  GM/15ML solution Take 45 mLs (30 g total) by mouth 4 (four) times daily. Patient taking differently: Take 30 g by mouth 3 (three) times daily.  08/06/16   Reyne Dumas, MD  omeprazole (PRILOSEC) 20 MG capsule Take 1 capsule (20 mg total) by mouth at bedtime. 12/22/16   Thayer Headings, MD  oxyCODONE-acetaminophen (PERCOCET) 5-325 MG tablet Take 1 tablet by mouth every 4 (four) hours as needed for severe pain. 05/15/17   Diona Browner, DDS  PROAIR HFA 108 [phone] Base) MCG/ACT inhaler Inhale 2 puffs into the lungs 4 (four) times daily as needed for shortness of breath or wheezing. 04/02/17   [provider]  rifaximin (XIFAXAN) 550 MG TABS tablet Take 1 tablet (550 mg total) by mouth 2 (two) times daily. 11/14/16   Doran Stabler, MD  spironolactone (ALDACTONE) 100 MG tablet TAKE 1 TABLET (100 MG TOTAL) BY MOUTH DAILY. Patient not taking: Reported on 05/11/2017 03/03/17   Doran Stabler, MD  triamterene-hydrochlorothiazide (MAXZIDE-25) 37.5-25 MG tablet Take 1 tablet by mouth daily.    [provider]  VITAMIN A PO Take 1 tablet by mouth daily.    [provider]    Family History Family History  Problem Relation Age of Onset  . Breast cancer Other   . Diabetes Other   . Hypertension Other   . Breast cancer Mother   . Hypertension Mother   . Diabetes Father   . Hypertension Sister   . Heart disease Sister   . Hypertension Paternal Grandmother   . Colon cancer Neg Hx     Social History Social History   Tobacco Use  . Smoking status: Current Some Day Smoker    Packs/day: 0.12    Years: 25.00    Pack years: 3.00    Types: Cigarettes  . Smokeless tobacco: Never Used  Substance Use Topics  . Alcohol use: Yes    Comment: A couple beers every few days   . Drug use: No    Comment: Last time for cocaine 05/09/17 ;[place]- last  time 05/14/17     Allergies   Patient has no known allergies.   Review of Systems Review of Systems  Musculoskeletal:  Positive for myalgias.  All other systems reviewed and are negative.    Physical Exam Updated Vital Signs BP 107/68 (BP Location: Left Arm)   Pulse (!) 59   Temp 98 F (36.7 C) (Oral)   Resp 16   SpO2 100%   Physical Exam  Constitutional: He is oriented to person, place, and time. He appears well-developed and well-nourished.  HENT:  Head: Normocephalic and atraumatic.  Mouth/Throat: Mucous membranes are dry.  Eyes: Conjunctivae and EOM are normal. Pupils are equal, round, and reactive to light. Scleral icterus is present.  Neck: Normal range of motion. Neck supple.  Cardiovascular: Normal rate, regular rhythm and intact distal pulses.  Pulmonary/Chest: Effort normal and breath sounds normal. No respiratory distress. He has no wheezes.  Speaking full sentences without difficulty.  Clear lung sounds in all fields.  Abdominal: Soft. He exhibits no distension and no mass. There is no tenderness. There is no guarding.  Generalized abdominal tenderness without rigidity, guarding, or distention. Mild ascites  Musculoskeletal: Normal range of motion.  No obvious injury or deformity.  Radial and pedal pulses intact bilaterally.  No swelling, warmth, or redness.  No shaking or tremors.  Patient moving all extremities without difficulty.  Neurological: He is alert and oriented to person, place, and time.  Skin: Skin is warm and dry.  Skin is very dry.  Finger nails are sunken   Psychiatric: He has a normal mood and affect.  Nursing note and vitals reviewed.    ED Treatments / Results  Labs (all labs ordered are listed, but only abnormal results are displayed) Labs Reviewed  CBC - Abnormal; Notable for the following components:      Result Value   RBC 2.00 (*)    Hemoglobin 7.5 (*)    HCT 21.3 (*)    MCV 106.5 (*)    MCH 37.5 (*)    RDW 16.0 (*)    Platelets 120 (*)    All other components within normal limits  COMPREHENSIVE METABOLIC PANEL - Abnormal; Notable for the following  components:   Sodium 124 (*)    Chloride 94 (*)    CO2 21 (*)    Glucose, Bld 127 (*)    BUN 43 (*)    Creatinine, Ser 1.64 (*)    Calcium 7.7 (*)    Total Protein 5.4 (*)    Albumin 2.1 (*)    AST 116 (*)    ALT 84 (*)    Alkaline Phosphatase 159 (*)    Total Bilirubin 2.4 (*)    GFR calc non Af Amer 44 (*)    GFR calc Af Amer 51 (*)    All other components within normal limits  URINALYSIS, ROUTINE W REFLEX MICROSCOPIC - Abnormal; Notable for the following components:   Color, Urine AMBER (*)    Hgb urine dipstick SMALL (*)    Bacteria, UA RARE (*)    Squamous Epithelial / LPF 0-5 (*)    All other components within normal limits  RAPID URINE DRUG SCREEN, HOSP PERFORMED - Abnormal; Notable for the following components:   Cocaine POSITIVE (*)    All other components within normal limits  PROTIME-INR - Abnormal; Notable for the following components:   Prothrombin Time 19.1 (*)    All other components within normal limits  AMMONIA - Abnormal; Notable for the following components:   Ammonia 66 (*)    All other components within normal limits  I-STAT CHEM 8, ED - Abnormal; Notable for the following components:   Sodium 125 (*)    Chloride 92 (*)    BUN 41 (*)    Creatinine, Ser 1.70 (*)    Glucose, Bld 122 (*)    Calcium, Ion 1.03 (*)    Hemoglobin 8.2 (*)    HCT 24.0 (*)    All other components within normal limits  CK  MAGNESIUM  LIPASE, BLOOD  CREATININE, URINE, RANDOM  SODIUM, URINE, RANDOM  TSH  OCCULT BLOOD X 1 CARD TO LAB, STOOL  IRON AND TIBC  VITAMIN B12  FOLATE RBC  HCV RNA QUANT  I-STAT TROPONIN, ED  POC OCCULT BLOOD, ED  TYPE AND SCREEN  ABO/RH    EKG  EKG Interpretation None       Radiology No results found.  Procedures Procedures (including critical care time)  Medications Ordered in ED Medications - No data to display   Initial Impression / Assessment and Plan / ED Course  I have reviewed the triage vital signs and the nursing  notes.  Pertinent labs & imaging results that were available during my care of the patient were reviewed by me and considered in my medical decision making (see chart for details).     Pt presenting for evaluation of generalized muscle cramps.  Physical exam shows patient who appears dehydrated without acute signs of distress. Significant medical history with concern for high risk of GI bleed, worsening anemia, and recent cocaine use.  Will obtain labs and EKG for further evaluation.  Will start banana bag due to concern of dehydration.  Labs show hyponatremia, AKI, worsening liver function, and worsening INR.  Hgb trending down, 7.5.  Was 8.2  4 days ago, 13, 1 month ago.  Patient with meld score of 28.  Overall picture concerning for worsening liver failure, kidney function, and overall health. At this time doubt SBP.  Will call to admit pt. Case discussed with attending, Dr Rex Kras evaluated the pt.   Discussed with hospitalist, recommends guaiac. Will send and patient to be admitted to hospitalist service.   Final Clinical Impressions(s) / ED Diagnoses   Final diagnoses:  Hyponatremia  AKI (acute kidney injury) ([place])  Liver failure without hepatic coma, unspecified chronicity (HCC)  Dehydration  Anemia, unspecified type    ED Discharge Orders    None       Franchot Heidelberg, PA-C 07/12/17 1620    Little, Wenda Overland, MD 07/15/17 2054

## 2017-07-12 NOTE — ED Triage Notes (Signed)
Patient presents by EMS with complaints with all over body cramping since thursday. Patient states to EMS that the pain begins in his hand and travels up his arms and also is cramping in his legs. Patient recently seen by doctor who told him his iron was low and so patient has been taking an iron supplement. Patient is A&O x4.

## 2017-07-12 NOTE — H&P (Addendum)
History and Physical    Jose [name]:[id number] DOB: 11/22/1955 DOA: 07/12/2017  PCP: [name], MD  Patient coming from: Home.   I have personally briefly reviewed patient's old medical records in [place]  Chief Complaint: generalized malaise and pain all over since 2 days.   HPI: Jose [name] is a 62 y.o. male with medical history significant of alcoholic liver cirrhosis, hepatitis C, ( completed the treatment), right inguinal hernia, substance abuse, ongoing alcohol abuse, hypertension, pancytopenia, ?esophageal varices, h/o hepatic encephalopathy, comes in for generalized malaise, and body aches, with crampy pain in the extremities, associated with abdominal pain, nausea, dry heaving. He denies fever, chills, sob, chest pain, syncope. He reports headache, dizziness and numbness of the left upper extremity started two days ago. He reports black stools last 3 days, which has resolved now. On arrival to ED, he was found to be afebrile, bradycardic. Labs revealed hemoglobin of 8.2, BUN of 41, sodium of 125 and creatinine of 1.7. He was referred to medical service for the evaluation of the above and symptomatic anemia.     Review of Systems: As per HPI otherwise 10 point review of systems negative.   Past Medical History:  Diagnosis Date  . Alcohol abuse   . Alcohol dependence ([place])   . Alcoholism ([place]) 07/12/2015  . Ascites   . Cirrhosis ([place])   . Depression   . Esophageal varices ([place]) 07/2016   per CT  . 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 Hepatitis C    Hepatitis C  . HOH (hard of hearing)   . Hypertension   . Inguinal hernia    right  . QT prolongation   . Shortness of breath    with exertion  . Thrombocytopenia ([place])     Past Surgical History:  Procedure Laterality Date  . CIRCUMCISION    . COLONOSCOPY  march 2015   Dr. Renee Harder: nodular mucosa at appendiceal orifice, tubular adenoma, extremely poor prep  . COLONOSCOPY    . TOOTH EXTRACTION  N/A 05/15/2017   Procedure: DENTAL RESTORATION and multiple teeth EXTRACTIONS;  Surgeon: Diona Browner, DDS;  Location: [facility];  Service: Oral Surgery;  Laterality: N/A;     reports that he has been smoking cigarettes.  He has a 3.00 pack-year smoking history. he has never used smokeless tobacco. He reports that he drinks alcohol. He reports that he does not use drugs.  No Known Allergies  Family History  Problem Relation Age of Onset  . Breast cancer Other   . Diabetes Other   . Hypertension Other   . Breast cancer Mother   . Hypertension Mother   . Diabetes Father   . Hypertension Sister   . Heart disease Sister   . Hypertension Paternal Grandmother   . Colon cancer Neg Hx    Reviewed   Prior to Admission medications   Medication Sig Start Date End Date Taking? Authorizing Provider  Cholecalciferol (VITAMIN D3 PO) Take 1 capsule by mouth daily.   Yes [provider]  fluticasone (FLONASE) 50 MCG/ACT nasal spray Place 1 spray into both nostrils daily.    Yes [provider]  ibuprofen (ADVIL,MOTRIN) 200 MG tablet Take 400 mg by mouth every 6 (six) hours as needed for mild pain.   Yes [provider]  lactulose (CHRONULAC) 10 GM/15ML solution Take 45 mLs (30 g total) by mouth 4 (four) times daily. Patient taking differently: Take 30 g by mouth 3 (  three) times daily.  08/06/16  Yes Reyne Dumas, MD  omeprazole (PRILOSEC) 20 MG capsule Take 1 capsule (20 mg total) by mouth at bedtime. 12/22/16  Yes Comer, Okey Regal, MD  PROAIR HFA 108 [phone] Base) MCG/ACT inhaler Inhale 2 puffs into the lungs 4 (four) times daily as needed for shortness of breath or wheezing. 04/02/17  Yes [provider]  rifaximin (XIFAXAN) 550 MG TABS tablet Take 1 tablet (550 mg total) by mouth 2 (two) times daily. 11/14/16  Yes Danis, Kirke Corin, MD  triamterene-hydrochlorothiazide (MAXZIDE-25) 37.5-25 MG tablet Take 1 tablet by mouth daily.   Yes [provider]  VITAMIN A PO  Take 1 tablet by mouth daily.   Yes [provider]  amoxicillin (AMOXIL) 500 MG capsule Take 1 capsule (500 mg total) by mouth 3 (three) times daily. Patient not taking: Reported on 07/12/2017 05/15/17   Diona Browner, DDS  furosemide (LASIX) 40 MG tablet Take 1 tablet (40 mg total) by mouth daily. Patient not taking: Reported on 05/11/2017 11/14/16   Doran Stabler, MD  oxyCODONE-acetaminophen (PERCOCET) 5-325 MG tablet Take 1 tablet by mouth every 4 (four) hours as needed for severe pain. Patient not taking: Reported on 07/12/2017 05/15/17   Diona Browner, DDS  spironolactone (ALDACTONE) 100 MG tablet TAKE 1 TABLET (100 MG TOTAL) BY MOUTH DAILY. Patient not taking: Reported on 05/11/2017 03/03/17   Doran Stabler, MD    Physical Exam: Vitals:   07/12/17 2423 07/12/17 0824 07/12/17 0836 07/12/17 0848  BP: 107/68 123/74    Pulse: (!) 59 (!) 45 (!) 50 (!) 52  Resp: 16  13 17   Temp:      TempSrc:      SpO2: 100% 97% 100% 100%    Constitutional: appears uncomfortable, but not in distress.  Vitals:   07/12/17 0639 07/12/17 0824 07/12/17 0836 07/12/17 0848  BP: 107/68 123/74    Pulse: (!) 59 (!) 45 (!) 50 (!) 52  Resp: 16  13 17   Temp:      TempSrc:      SpO2: 100% 97% 100% 100%   Eyes: PERRL, lids and conjunctivae yellowish tinge.  ENMT: Mucous membranes are dry. Posterior pharynx clear of any exudate or lesions. Dental caries.  Neck: normal, supple, no masses, no thyromegaly Respiratory: clear to auscultation bilaterally, no wheezing, no crackles. Normal respiratory effort. No accessory muscle use.  Cardiovascular: Regular rate and rhythm, no murmurs / rubs / gallops. No extremity edema.  No carotid bruits.  Abdomen: distended, firm, mild gen tenderness. Bowel sounds heard.  Musculoskeletal: no clubbing / cyanosis.no pedal edema.  Skin: no rashes, lesions, ulcers. No induration Neurologic: CN 2-12 grossly intact. Sensation intact, DTR normal. Strength 5/5 in all 4.    Psychiatric: Normal judgment and insight. Alert and oriented x 3. Normal mood.     Labs on Admission: I have personally reviewed following labs and imaging studies  CBC: Recent Labs  Lab 07/08/17 1033 07/12/17 0822 07/12/17 0829  WBC 10.3 6.4  --   NEUTROABS 7.5*  --   --   HGB 8.2* 7.5* 8.2*  HCT 24.4* 21.3* 24.0*  MCV 113.9* 106.5*  --   PLT 159 120*  --    Basic Metabolic Panel: Recent Labs  Lab 07/12/17 0822 07/12/17 0829 07/12/17 0854  NA 124* 125*  --   K 3.6 3.7  --   CL 94* 92*  --   CO2 21*  --   --  GLUCOSE 127* 122*  --   BUN 43* 41*  --   CREATININE 1.64* 1.70*  --   CALCIUM 7.7*  --   --   MG  --   --  2.2   GFR: Estimated Creatinine Clearance: 46 mL/min (A) (by C-G formula based on SCr of 1.7 mg/dL (H)). Liver Function Tests: Recent Labs  Lab 07/12/17 0822  AST 116*  ALT 84*  ALKPHOS 159*  BILITOT 2.4*  PROT 5.4*  ALBUMIN 2.1*   Recent Labs  Lab 07/12/17 0800  LIPASE 43   No results for input(s): AMMONIA in the last 168 hours. Coagulation Profile: Recent Labs  Lab 07/12/17 0854  INR 1.62   Cardiac Enzymes: Recent Labs  Lab 07/12/17 0822  CKTOTAL 221   BNP (last 3 results) No results for input(s): PROBNP in the last 8760 hours. HbA1C: No results for input(s): HGBA1C in the last 72 hours. CBG: No results for input(s): GLUCAP in the last 168 hours. Lipid Profile: No results for input(s): CHOL, HDL, LDLCALC, TRIG, CHOLHDL, LDLDIRECT in the last 72 hours. Thyroid Function Tests: No results for input(s): TSH, T4TOTAL, FREET4, T3FREE, THYROIDAB in the last 72 hours. Anemia Panel: No results for input(s): VITAMINB12, FOLATE, FERRITIN, TIBC, IRON, RETICCTPCT in the last 72 hours. Urine analysis:    Component Value Date/Time   COLORURINE AMBER (A) 07/12/2017 0835   APPEARANCEUR CLEAR 07/12/2017 0835   LABSPEC 1.018 07/12/2017 0835   PHURINE 5.0 07/12/2017 0835   GLUCOSEU NEGATIVE 07/12/2017 0835   HGBUR SMALL (A) 07/12/2017  0835   BILIRUBINUR NEGATIVE 07/12/2017 0835   KETONESUR NEGATIVE 07/12/2017 0835   PROTEINUR NEGATIVE 07/12/2017 0835   UROBILINOGEN 4.0 (H) 04/26/2013 0500   NITRITE NEGATIVE 07/12/2017 0835   LEUKOCYTESUR NEGATIVE 07/12/2017 0835    Radiological Exams on Admission: No results found.  EKG: Independently reviewed. Sinus bradycardia with prolonged QT INTERVAL.   Assessment/Plan Active Problems:   Thrombocytopenia (HCC)   Alcoholism ([place])   Alcoholic cirrhosis of liver with ascites (HCC)   Elevated LFTs   Right inguinal hernia   Essential hypertension   Protein-calorie malnutrition, severe (HCC)   Anemia of chronic disease   Hepatitis C, chronic (HCC)   End stage liver disease (HCC)   Pancytopenia, acquired (HCC)   Symptomatic anemia   Hyponatremia    Generalized malaise/ headache, nausea, dizziness:  Probably secondary to dehydration from hyponatremia vs symptomatic anemia.  Stop lasix and HCTZ and spironolactone for now and start him on gentle hydration.  Watch for fluid overload.  Repeat sodium tonight.  Pt has a history of chronic hyponatremia and hs sodium runs between 128 to 133 in general.     Symptomatic anemia Differential include variceal bleed vs gastritis from use of NSAID recently for body aches. Last hemoglobin around 13 in the month of January dropped to 8. Pt also reports black stools the last 3 days.  Get anemia panel.  Stool for occult blood ordered.  Obtain GI consult. Pt usually follows with Dr Loletha Carrow as outpatient.    Elevated liver function tests:  Mild acute hepatitis superimposed on alcoholic liver cirrhosis and hepatitis C.  Pt reports his last alcohol use  one week ago.  UDS is positive for cocaine.  No signs of hepatic encephalopathy.    Acute renal failure:  Baseline creatinine around 1, currently its .1.7. Once again gently hydrate and repeat renal parameters. Get urine electrolytes and US renal to rule out obstruction  Holding  nephrotoxins.  Alcoholic liver cirrhosis and H/O hepatitis C Get viral load.  Resume xifaxi   Abdominal pain: Afebrile, no overt signs of SBP,  Get [place] abd to evaluate for ascites.   QT prolongation:  Keep mag >2 and potassium greater than 4.  Monitor on tele.  Repeat EKG .   Hypertension: well controlled. Currently holding lasix, and spironolactone and HCTZ for ARF, hyponatremia.   Pancytopenia:  From liver cirrhosis and hep C.  Wbc is 6.4, hemoglobin around 8 and platelets of 120.  Follows u with Dr Alvy Bimler for the pancytopenia.  Rule out GI bleed for the anemia.   Substance abuse; Will need counseling. He reports to have taken cocaine for uncontrolled body aches.   Protein calorie malnutrition;  Get dietary consult.    Numbness of the left upper extremity since yesterday:  Low suspicion for acute stroke, ? Neuropathy  will get CT head without contrast.   Right inguinal hernia:  Reducible.    DVT prophylaxis: SCD'S Code Status: FULL CODE.  Family Communication: NONE AT BEDSIDE.  Disposition Plan: pending EVAL by GI.  Consults called: [facility] Admission status: INPATIENT/ TELE.    Hosie Poisson MD [facility] Pager [phone]  If 7PM-7AM, please contact night-coverage [email or web address] Password [email or web address]  07/12/2017, 10:30 AM

## 2017-07-13 ENCOUNTER — Encounter (HOSPITAL_COMMUNITY): Payer: Self-pay | Admitting: *Deleted

## 2017-07-13 ENCOUNTER — Encounter (HOSPITAL_COMMUNITY)
Admission: EM | Disposition: A | Discharge status: Home / Self Care | Payer: Self-pay | Source: Home / Self Care | Attending: Internal Medicine

## 2017-07-13 ENCOUNTER — Observation Stay (HOSPITAL_COMMUNITY): Payer: Medicaid Other | Admitting: Certified Registered Nurse Anesthetist

## 2017-07-13 DIAGNOSIS — K269 Duodenal ulcer, unspecified as acute or chronic, without hemorrhage or perforation: Secondary | ICD-10-CM

## 2017-07-13 DIAGNOSIS — F1490 Cocaine use, unspecified, uncomplicated: Secondary | ICD-10-CM

## 2017-07-13 DIAGNOSIS — K921 Melena: Secondary | ICD-10-CM

## 2017-07-13 DIAGNOSIS — N179 Acute kidney failure, unspecified: Secondary | ICD-10-CM

## 2017-07-13 DIAGNOSIS — K4090 Unilateral inguinal hernia, without obstruction or gangrene, not specified as recurrent: Secondary | ICD-10-CM | POA: Diagnosis not present

## 2017-07-13 DIAGNOSIS — K7040 Alcoholic hepatic failure without coma: Secondary | ICD-10-CM | POA: Diagnosis present

## 2017-07-13 DIAGNOSIS — K2970 Gastritis, unspecified, without bleeding: Secondary | ICD-10-CM | POA: Diagnosis not present

## 2017-07-13 DIAGNOSIS — K7290 Hepatic failure, unspecified without coma: Secondary | ICD-10-CM

## 2017-07-13 DIAGNOSIS — K7011 Alcoholic hepatitis with ascites: Secondary | ICD-10-CM | POA: Diagnosis present

## 2017-07-13 DIAGNOSIS — K7031 Alcoholic cirrhosis of liver with ascites: Secondary | ICD-10-CM | POA: Diagnosis present

## 2017-07-13 DIAGNOSIS — D61818 Other pancytopenia: Secondary | ICD-10-CM | POA: Diagnosis present

## 2017-07-13 DIAGNOSIS — D539 Nutritional anemia, unspecified: Secondary | ICD-10-CM | POA: Diagnosis present

## 2017-07-13 DIAGNOSIS — R252 Cramp and spasm: Secondary | ICD-10-CM | POA: Diagnosis present

## 2017-07-13 DIAGNOSIS — R202 Paresthesia of skin: Secondary | ICD-10-CM | POA: Diagnosis present

## 2017-07-13 DIAGNOSIS — B182 Chronic viral hepatitis C: Secondary | ICD-10-CM | POA: Diagnosis not present

## 2017-07-13 DIAGNOSIS — D638 Anemia in other chronic diseases classified elsewhere: Secondary | ICD-10-CM

## 2017-07-13 DIAGNOSIS — K7030 Alcoholic cirrhosis of liver without ascites: Secondary | ICD-10-CM

## 2017-07-13 DIAGNOSIS — E860 Dehydration: Secondary | ICD-10-CM | POA: Diagnosis present

## 2017-07-13 DIAGNOSIS — D649 Anemia, unspecified: Secondary | ICD-10-CM | POA: Diagnosis not present

## 2017-07-13 DIAGNOSIS — D6959 Other secondary thrombocytopenia: Secondary | ICD-10-CM | POA: Diagnosis present

## 2017-07-13 DIAGNOSIS — N5089 Other specified disorders of the male genital organs: Secondary | ICD-10-CM | POA: Diagnosis present

## 2017-07-13 DIAGNOSIS — F1010 Alcohol abuse, uncomplicated: Secondary | ICD-10-CM

## 2017-07-13 DIAGNOSIS — D509 Iron deficiency anemia, unspecified: Secondary | ICD-10-CM | POA: Diagnosis present

## 2017-07-13 DIAGNOSIS — E559 Vitamin D deficiency, unspecified: Secondary | ICD-10-CM | POA: Diagnosis present

## 2017-07-13 DIAGNOSIS — I4581 Long QT syndrome: Secondary | ICD-10-CM | POA: Diagnosis present

## 2017-07-13 DIAGNOSIS — D684 Acquired coagulation factor deficiency: Secondary | ICD-10-CM | POA: Diagnosis present

## 2017-07-13 DIAGNOSIS — E43 Unspecified severe protein-calorie malnutrition: Secondary | ICD-10-CM | POA: Diagnosis present

## 2017-07-13 DIAGNOSIS — R160 Hepatomegaly, not elsewhere classified: Secondary | ICD-10-CM | POA: Diagnosis present

## 2017-07-13 DIAGNOSIS — I10 Essential (primary) hypertension: Secondary | ICD-10-CM | POA: Diagnosis present

## 2017-07-13 DIAGNOSIS — D689 Coagulation defect, unspecified: Secondary | ICD-10-CM | POA: Diagnosis not present

## 2017-07-13 DIAGNOSIS — I8510 Secondary esophageal varices without bleeding: Secondary | ICD-10-CM | POA: Diagnosis present

## 2017-07-13 DIAGNOSIS — K259 Gastric ulcer, unspecified as acute or chronic, without hemorrhage or perforation: Secondary | ICD-10-CM | POA: Diagnosis not present

## 2017-07-13 DIAGNOSIS — K267 Chronic duodenal ulcer without hemorrhage or perforation: Secondary | ICD-10-CM | POA: Diagnosis present

## 2017-07-13 DIAGNOSIS — E871 Hypo-osmolality and hyponatremia: Secondary | ICD-10-CM | POA: Diagnosis present

## 2017-07-13 HISTORY — PX: ESOPHAGOGASTRODUODENOSCOPY (EGD) WITH PROPOFOL: SHX5813

## 2017-07-13 LAB — CBC
HEMATOCRIT: 21.6 % — AB (ref 39.0–52.0)
HEMOGLOBIN: 7.6 g/dL — AB (ref 13.0–17.0)
MCH: 37.6 pg — AB (ref 26.0–34.0)
MCHC: 35.2 g/dL (ref 30.0–36.0)
MCV: 106.9 fL — AB (ref 78.0–100.0)
Platelets: 124 10*3/uL — ABNORMAL LOW (ref 150–400)
RBC: 2.02 MIL/uL — ABNORMAL LOW (ref 4.22–5.81)
RDW: 16 % — AB (ref 11.5–15.5)
WBC: 5.7 10*3/uL (ref 4.0–10.5)

## 2017-07-13 LAB — COMPREHENSIVE METABOLIC PANEL
ALBUMIN: 1.9 g/dL — AB (ref 3.5–5.0)
ALT: 74 U/L — ABNORMAL HIGH (ref 17–63)
ANION GAP: 8 (ref 5–15)
AST: 107 U/L — ABNORMAL HIGH (ref 15–41)
Alkaline Phosphatase: 157 U/L — ABNORMAL HIGH (ref 38–126)
BILIRUBIN TOTAL: 2.1 mg/dL — AB (ref 0.3–1.2)
BUN: 35 mg/dL — ABNORMAL HIGH (ref 6–20)
CO2: 21 mmol/L — ABNORMAL LOW (ref 22–32)
Calcium: 7.4 mg/dL — ABNORMAL LOW (ref 8.9–10.3)
Chloride: 97 mmol/L — ABNORMAL LOW (ref 101–111)
Creatinine, Ser: 1.41 mg/dL — ABNORMAL HIGH (ref 0.61–1.24)
GFR calc Af Amer: >60 mL/min (ref >60)
GFR, EST NON AFRICAN AMERICAN: 52 mL/min — AB (ref >60)
GLUCOSE: 124 mg/dL — AB (ref 65–99)
POTASSIUM: 3.1 mmol/L — AB (ref 3.5–5.1)
Sodium: 126 mmol/L — ABNORMAL LOW (ref 135–145)
TOTAL PROTEIN: 5.1 g/dL — AB (ref 6.5–8.1)

## 2017-07-13 LAB — HCV RNA QUANT
HCV Quantitative Log: 3.79 log10 IU/mL (ref >1.70)
HCV Quantitative: 6170 IU/mL (ref >50)

## 2017-07-13 LAB — CORTISOL: CORTISOL PLASMA: 10.2 ug/dL

## 2017-07-13 LAB — FOLATE RBC
FOLATE, HEMOLYSATE: 553.7 ng/mL
FOLATE, RBC: UNDETERMINED ng/mL

## 2017-07-13 SURGERY — ESOPHAGOGASTRODUODENOSCOPY (EGD) WITH PROPOFOL
Anesthesia: Monitor Anesthesia Care

## 2017-07-13 MED ORDER — PROPOFOL 10 MG/ML IV BOLUS
INTRAVENOUS | Status: AC
  Filled 2017-07-13: qty 40

## 2017-07-13 MED ORDER — POTASSIUM CHLORIDE CRYS ER 20 MEQ PO TBCR
40.0000 meq | EXTENDED_RELEASE_TABLET | ORAL | Status: AC
  Administered 2017-07-13 (×2): 40 meq via ORAL
  Filled 2017-07-13 (×2): qty 2

## 2017-07-13 MED ORDER — ALBUMIN HUMAN 25 % IV SOLN
100.0000 g | Freq: Once | INTRAVENOUS | Status: AC
  Administered 2017-07-13: 100 g via INTRAVENOUS
  Filled 2017-07-13: qty 400

## 2017-07-13 MED ORDER — LACTATED RINGERS IV SOLN
INTRAVENOUS | Status: DC
  Administered 2017-07-13: 13:00:00 via INTRAVENOUS

## 2017-07-13 MED ORDER — LIDOCAINE 2% (20 MG/ML) 5 ML SYRINGE
INTRAMUSCULAR | Status: DC | PRN
  Administered 2017-07-13: 100 mg via INTRAVENOUS

## 2017-07-13 MED ORDER — PANTOPRAZOLE SODIUM 40 MG PO TBEC
40.0000 mg | DELAYED_RELEASE_TABLET | Freq: Two times a day (BID) | ORAL | Status: DC
  Administered 2017-07-13 – 2017-07-15 (×4): 40 mg via ORAL
  Filled 2017-07-13 (×4): qty 1

## 2017-07-13 MED ORDER — POLYVINYL ALCOHOL 1.4 % OP SOLN
1.0000 [drp] | OPHTHALMIC | Status: DC | PRN
  Administered 2017-07-13 – 2017-07-14 (×2): 1 [drp] via OPHTHALMIC
  Filled 2017-07-13: qty 15

## 2017-07-13 MED ORDER — PROPOFOL 500 MG/50ML IV EMUL
INTRAVENOUS | Status: DC | PRN
  Administered 2017-07-13: 125 ug/kg/min via INTRAVENOUS

## 2017-07-13 MED ORDER — ALBUMIN HUMAN 25 % IV SOLN
100.0000 g | Freq: Once | INTRAVENOUS | Status: DC
  Filled 2017-07-13: qty 400

## 2017-07-13 SURGICAL SUPPLY — 14 items

## 2017-07-13 NOTE — Anesthesia Preprocedure Evaluation (Addendum)
Anesthesia Evaluation  Patient identified by MRN, date of birth, ID band Patient awake    Reviewed: Allergy & Precautions, NPO status , Patient's Chart, lab work & pertinent test results  Airway Mallampati: II  TM Distance: >3 FB Neck ROM: Full    Dental  (+) Dental Advisory Given, Edentulous Upper, Missing, Poor Dentition   Pulmonary shortness of breath and with exertion, Current Smoker,    Pulmonary exam normal breath sounds clear to auscultation       Cardiovascular hypertension, Pt. on medications Normal cardiovascular exam+ Valvular Problems/Murmurs AI  Rhythm:Regular Rate:Normal + Systolic murmurs EKG - SB with QTc 533  '17 TTE - Mild LVH. EF 65%  to 70%. Grade 1 diastolic   Dysfunction. Mild AI. Left atrium was moderately dilated, right atrium was mildly dilated. PASP was mildly increased. PA peak pressure: 33 mm Hg (S).   Neuro/Psych Depression Hard of hearing negative neurological ROS  negative psych ROS   GI/Hepatic GERD  Controlled and Medicated,(+) Cirrhosis   Esophageal Varices and ascites  substance abuse  alcohol use and cocaine use, Hepatitis -, C  Endo/Other  negative endocrine ROS  Renal/GU negative Renal ROS  negative genitourinary   Musculoskeletal  (+) Arthritis ,   Abdominal   Peds  Hematology  (+) anemia , Thrombocytopenia   Anesthesia Other Findings   Reproductive/Obstetrics                            Anesthesia Physical Anesthesia Plan  ASA: III  Anesthesia Plan: MAC   Post-op Pain Management:    Induction: Intravenous  PONV Risk Score and Plan: Propofol infusion and Treatment may vary due to age or medical condition  Airway Management Planned: Nasal Cannula and Natural Airway  Additional Equipment: None  Intra-op Plan:   Post-operative Plan:   Informed Consent: I have reviewed the patients History and Physical, chart, labs and discussed the  procedure including the risks, benefits and alternatives for the proposed anesthesia with the patient or authorized representative who has indicated his/her understanding and acceptance.     Plan Discussed with: CRNA and Anesthesiologist  Anesthesia Plan Comments:         Anesthesia Quick Evaluation

## 2017-07-13 NOTE — Progress Notes (Signed)
PROGRESS NOTE    SHAD LEDVINA  WUJ:[id number] DOB: 1955-06-04 DOA: 07/12/2017 PCP: [name], MD   Brief Narrative:  62 year old with past medical history relevant for alcoholic and hepatitis C cirrhosis with failure of treatment of hepatitis C complicated by recurrent hepatic encephalopathy, large liver mass of undetermined etiology with negative biopsy in 2017 with only mildly elevated AFP, polysubstance abuse including cocaine and alcohol, hypertension admitted with anemia and widespread lab abnormalities including AK I   Assessment & Plan:   Active Problems:   Thrombocytopenia ([place])   Alcoholism ([place])   Alcoholic cirrhosis of liver with ascites (HCC)   Elevated LFTs   Right inguinal hernia   Essential hypertension   Protein-calorie malnutrition, severe (HCC)   Anemia of chronic disease   Hepatitis C, chronic (HCC)   End stage liver disease (HCC)   Pancytopenia, acquired ([place])   Symptomatic anemia   Hyponatremia   #) Microcytic anemia: Patient's last normal CBC was in January 2019 with a normal hemoglobin.  It is since progressively drifted down fairly dramatically.  His history is extremely poor for any upper or lower GI bleeding source.  Suspect that the patient also has some component of worsening of his liver disease. -GI following appreciate recommendations -B12 appropriately repleted, folate pending -EGD today on 07/13/2017 showed only small superficial duodenal ulcers with no clear reason for significant bleeding. 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  Low suspicion for hepatorenal syndrome. -IV albumin -Hold nephrotoxins  #) Decompensated cirrhosis comp gated by ascites and hepatic encephalopathy: Patient continues to drink significant amounts of alcohol.  He is unfortunately not a candidate for transplant. -Hold spironolactone -Hold furosemide -Continue lactulose and rifaximin 550 twice a day -GI has recommended  palliative care consult  #) Liver lesion: Patient was noted to have liver lesion that was initially suspicious for hepatocellular carcinoma.  This was in 2017.  His AFP was elevated however not significantly so.  A biopsy did show atypical cells but no evidence of cancer.  He was felt that this was likely a regenerative nodule.  #) Hypertension: -Hold triamterene 37.5 mg daily -Hold HCTZ 25 mg daily  Fluids: IV fluids and albumin Electrodes: Monitor and supplement Nutrition: Sodium restricted diet  Prophylaxis: Subcu heparin  Disposition: Pending etiology of anemia  Full code  Consultants:   Gastroenterology  Procedures: (Don't include imaging studies which can be auto populated. Include things that cannot be auto populated i.e. Echo, Carotid and venous dopplers, Foley, Bipap, HD, tubes/drains, wound vac, central lines etc)  07/13/2017 EGD: Only superficial duodenal ulcers  Antimicrobials: (specify start and planned stop date. Auto populated tables are space occupying and do not give end dates)  None   Subjective: Patient reports that he is feeling better.  He has no complaints at this time.  He does have some diffuse abdominal pain.  Objective: Vitals:   07/13/17 1315 07/13/17 1410 07/13/17 1420 07/13/17 1446  BP: (!) 146/75 122/72 120/69 111/73  Pulse: (!) 50 70 (!) 56 (!) 52  Resp: (!) 21 18 19 18   Temp: 98.4 F (36.9 C) 98.6 F (37 C)  98.3 F (36.8 C)  TempSrc: Oral Oral  Oral  SpO2: 100% 100% 100% 98%  Weight: 82.2 kg (181 lb 4.8 oz)     Height: 5\' 6"  (1.676 m)       Intake/Output Summary (Last 24 hours) at 07/13/2017 1642 Last data filed at 07/13/2017 1500 Gross per 24  hour  Intake 912 ml  Output 9 ml  Net 903 ml   Filed Weights   07/13/17 1315  Weight: 82.2 kg (181 lb 4.8 oz)    Examination:  General exam: Appears calm and comfortable  Respiratory system: Clear to auscultation. Respiratory effort normal. Cardiovascular system: Regular rate and  rhythm, distant heart sounds. Gastrointestinal system: Distended, shifting dullness, dull at flanks, plus bowel sounds Central nervous system: Alert and oriented. No focal neurological deficits.  No asterixis Extremities: 1+ lower extremity edema Skin: No rashes on visible skin Psychiatry: Judgement and insight appear poor    Data Reviewed: I have personally reviewed following labs and imaging studies  CBC: Recent Labs  Lab 07/08/17 1033 07/12/17 0822 07/12/17 0829 07/13/17 0503  WBC 10.3 6.4  --  5.7  NEUTROABS 7.5*  --   --   --   HGB 8.2* 7.5* 8.2* 7.6*  HCT 24.4* 21.3* 24.0* 21.6*  MCV 113.9* 106.5*  --  106.9*  PLT 159 120*  --  409*   Basic Metabolic Panel: Recent Labs  Lab 07/12/17 0822 07/12/17 0829 07/12/17 0854 07/13/17 0503  NA 124* 125*  --  126*  K 3.6 3.7  --  3.1*  CL 94* 92*  --  97*  CO2 21*  --   --  21*  GLUCOSE 127* 122*  --  124*  BUN 43* 41*  --  35*  CREATININE 1.64* 1.70*  --  1.41*  CALCIUM 7.7*  --   --  7.4*  MG  --   --  2.2  --    GFR: Estimated Creatinine Clearance: 55.4 mL/min (A) (by C-G formula based on SCr of 1.41 mg/dL (H)). Liver Function Tests: Recent Labs  Lab 07/12/17 0822 07/13/17 0503  AST 116* 107*  ALT 84* 74*  ALKPHOS 159* 157*  BILITOT 2.4* 2.1*  PROT 5.4* 5.1*  ALBUMIN 2.1* 1.9*   Recent Labs  Lab 07/12/17 0800  LIPASE 43   Recent Labs  Lab 07/12/17 1304  AMMONIA 66*   Coagulation Profile: Recent Labs  Lab 07/12/17 0854  INR 1.62   Cardiac Enzymes: Recent Labs  Lab 07/12/17 0822  CKTOTAL 221   BNP (last 3 results) No results for input(s): PROBNP in the last 8760 hours. HbA1C: No results for input(s): HGBA1C in the last 72 hours. CBG: No results for input(s): GLUCAP in the last 168 hours. Lipid Profile: No results for input(s): CHOL, HDL, LDLCALC, TRIG, CHOLHDL, LDLDIRECT in the last 72 hours. Thyroid Function Tests: Recent Labs    07/12/17 0822  TSH 3.886   Anemia Panel: Recent  Labs    07/12/17 0822  VITAMINB12 3,961*  TIBC 196*  IRON 20*   Sepsis Labs: No results for input(s): PROCALCITON, LATICACIDVEN in the last 168 hours.  No results found for this or any previous visit (from the past 240 hour(s)).       Radiology Studies: Ct Head Wo Contrast  Result Date: 07/12/2017 CLINICAL DATA:  Patient with left upper extremity numbness. EXAM: CT HEAD WITHOUT CONTRAST TECHNIQUE: Contiguous axial images were obtained from the base of the skull through the vertex without intravenous contrast. COMPARISON:  Brain CT 08/14/2016. FINDINGS: Brain: Ventricles and sulci are prominent compatible with atrophy. Periventricular and subcortical white matter hypodensity compatible with chronic microvascular ischemic changes. No evidence for acute cortically based infarct, intracranial hemorrhage, mass lesion or mass-effect. Vascular: Unremarkable. Skull: Intact. Sinuses/Orbits: Paranasal sinuses are well aerated. Mastoid air cells unremarkable. Orbits unremarkable. Chronic deformity  of the medial orbital walls bilaterally. Other: None. IMPRESSION: No acute intracranial process. Atrophy and chronic microvascular ischemic changes. Electronically Signed   By: Lovey Newcomer M.D.   On: 07/12/2017 12:46   US Abdomen Complete  Result Date: 07/12/2017 CLINICAL DATA:  Patient with history of hepatitis C, cirrhosis. Hypertension. Elevated LFTs. EXAM: ABDOMEN ULTRASOUND COMPLETE COMPARISON:  Ultrasound 03/27/2017. FINDINGS: Gallbladder: Sludge within the gallbladder lumen. Gallbladder wall thickening measuring up to 3.6 mm. Negative sonographic Murphy's sign. Common bile duct: Diameter: 4 mm Liver: The liver is heterogeneous in echogenicity and nodular in contour. Within the right hepatic lobe there is a 5.9 x 6.5 x 5.2 cm mixed echogenicity mass, previously measuring 4.4 x 3.9 x 4.2 cm. 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 IVC: No abnormality visualized. Pancreas: Visualized portion  unremarkable. Spleen: Size and appearance within normal limits. Right Kidney: Length: 11.5 cm. Echogenicity within normal limits. No mass or hydronephrosis visualized. Left Kidney: Length: 12.2 cm. Echogenicity within normal limits. No mass or hydronephrosis visualized. Abdominal aorta: No aneurysm visualized. Other findings: None. IMPRESSION: 1. Interval increase in size of mixed echogenicity mass within the right hepatic lobe, potentially representing hepatocellular carcinoma. 2. Stones and sludge within the gallbladder lumen with associated wall thickening. Wall thickening is nonspecific in the setting of cirrhosis. 3. Cirrhosis. 4. 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 Electronically Signed   By: Lovey Newcomer M.D.   On: 07/12/2017 12:42        Scheduled Meds: . lactulose  30 g Oral QID  . pantoprazole  40 mg Oral BID AC  . rifaximin  550 mg Oral BID   Continuous Infusions:   LOS: 0 days    Time spent: [place], MD [facility] Pager [phone]  If 7PM-7AM, please contact night-coverage [email or web address] Password [facility] 07/13/2017, 4:42 PM

## 2017-07-13 NOTE — Op Note (Signed)
[facility] Patient Name: Jose [name] Procedure Date: 07/13/2017 MRN: [id number] Attending MD: Estill Cotta. Loletha Carrow , MD Date of Birth: 02/08/1956 CSN: [id number] Age: 62 Admit Type: Outpatient Procedure:                Upper GI endoscopy Indications:              Unexplained anemia (macrocytic, heme negative upon                            admission); cirrhosis Providers:                Mallie Mussel L. Loletha Carrow, MD, Presley Raddle, RN, Charolette Child, Technician, Christell Faith, CRNA Referring MD:              Medicines:                Monitored Anesthesia Care Complications:            No immediate complications. Estimated Blood Loss:     Estimated blood loss was minimal. Procedure:                Pre-Anesthesia Assessment:                           - Prior to the procedure, a History and Physical                            was performed, and patient medications and                            allergies were reviewed. The patient's tolerance of                            previous anesthesia was also reviewed. The risks                            and benefits of the procedure and the sedation                            options and risks were discussed with the patient.                            All questions were answered, and informed consent                            was obtained. Prior Anticoagulants: The patient has                            taken no previous anticoagulant or antiplatelet                            agents. ASA Grade Assessment: III - A patient with  severe systemic disease. After reviewing the risks                            and benefits, the patient was deemed in                            satisfactory condition to undergo the procedure.                           After obtaining informed consent, the endoscope was                            passed under direct vision. Throughout the    procedure, the patient's blood pressure, pulse, and                            oxygen saturations were monitored continuously. The                            Endoscope was introduced through the mouth, and                            advanced to the second part of duodenum. The upper                            GI endoscopy was accomplished without difficulty.                            The patient tolerated the procedure well. Scope In: Scope Out: Findings:      The esophagus was normal.      There is no endoscopic evidence of varices in the entire esophagus.      A few non-bleeding erosions were found in the gastric body. There were       no stigmata of recent bleeding. Two biopsies were taken from the antrum       and body with a cold forceps for histology - rule out H. pylori.      The cardia and gastric fundus were normal on retroflexion.      Multiple non-bleeding superficial duodenal ulcers with no stigmata of       bleeding were found in the duodenal bulb and in the first portion of the       duodenum. Impression:               - Normal esophagus.                           - Gastric erosions without bleeding. Biopsied.                           - Multiple non-bleeding duodenal ulcers with no                            stigmata of bleeding.                           It is not  clear that these findings entirely                            explain the patient's significant hemoglobin drop                            in the last 2 months.                           Folic acid level pending and workup for enlarging                            liver mass in progress. Moderate Sedation:      MAC sedation used Recommendation:           - Resume regular diet.                           - Continue present medications.                           - Await pathology results. Procedure Code(s):        --- Professional ---                           [phone], Esophagogastroduodenoscopy, flexible,                             transoral; with biopsy, single or multiple Diagnosis Code(s):        --- Professional ---                           K25.9, Gastric ulcer, unspecified as acute or                            chronic, without hemorrhage or perforation                           K26.9, Duodenal ulcer, unspecified as acute or                            chronic, without hemorrhage or perforation                           D50.9, Iron deficiency anemia, unspecified CPT copyright 2016 American Medical Association. All rights reserved. The codes documented in this report are preliminary and upon coder review may  be revised to meet current compliance requirements. Boy Delamater L. Loletha Carrow, MD 07/13/2017 2:10:51 PM This report has been signed electronically. Number of Addenda: 0

## 2017-07-13 NOTE — Progress Notes (Signed)
PT Cancellation Note  Patient Details Name: TALTON DELPRIORE MRN: [id number] DOB: Aug 16, 1955   Cancelled Treatment:    Reason Eval/Treat Not Completed: Patient at procedure or test/unavailable Endo   Murel Shenberger,KATHrine E 07/13/2017, 1:15 PM Carmelia Bake, PT, DPT 07/13/2017 Pager: [phone]

## 2017-07-13 NOTE — H&P (View-Only) (Signed)
[facility] Consult Note   History Jose [name] MRN # [id number]  Date of Admission: 07/12/2017 Date of Consultation: 07/13/2017 Referring physician: Dr. Cristy Folks, MD Primary Care Provider: [name], MD Primary Gastroenterologist: Dr. Wilfrid Lund   Reason for Consultation/Chief Complaint: Macrocytic anemia   Subjective  HPI:  This is a 62 year old man known to me from the outpatient setting, last office visit July 2018.  He has a history of cirrhosis related to ongoing alcohol abuse and hepatitis C that was treated last year and had not end of treatment response with negative PCR.  He also carries a diagnosis of hemochromatosis despite normal genetic testing in 2007.  He has had periodic phlebotomy by dermatology.  He was admitted through the ED for multiple somatic complaints including chest pain, muscle cramps, generalized fatigue.  He was noted last week in hematology clinic to be considerably anemic with a high MCV.  His last known hemoglobin was normal in mid January of this year, the day before dental extractions were performed.  Patient reports that he had little bleeding from that procedure, and none by the following day.  He is a vague historian, describes perhaps some dark stools a few days last week.  At the time of admission he was heme-negative and has had no overt GI bleeding since admission yesterday.  He denies bright red blood per rectum, black tarry stools, hematemesis, nausea, vomiting,  or weight loss.  He has some difficulty chewing since he only has a small number of teeth left after the previous extraction, and is also causing some feelings of difficulty swallowing.  Some admission chest pain is now resolved.  He describes erratic muscle cramps, especially at night. His alcohol level was negative upon admission, reported last drink sometime last week.  However, his UDS was positive for cocaine.  ROS:   All other systems are negative except  as noted above in the HPI  Past Medical History Past Medical History:  Diagnosis Date  . Alcohol abuse   . Alcohol dependence ([place])   . Alcoholism ([place]) 07/12/2015  . Ascites   . Cirrhosis ([place])   . Depression   . 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 Hepatitis C    Hepatitis C  . HOH (hard of hearing)   . Hypertension   . Inguinal hernia    right  . QT prolongation   . Shortness of breath    with exertion  . Thrombocytopenia [facility])     Past Surgical History Past Surgical History:  Procedure Laterality Date  . CIRCUMCISION    . COLONOSCOPY  march 2015   Dr. Renee Harder: nodular mucosa at appendiceal orifice, tubular adenoma, extremely poor prep  . COLONOSCOPY    . TOOTH EXTRACTION N/A 05/15/2017   Procedure: DENTAL RESTORATION and multiple teeth EXTRACTIONS;  Surgeon: Diona Browner, DDS;  Location: [place];  Service: Oral Surgery;  Laterality: N/A;    Family History Family History  Problem Relation Age of Onset  . Breast cancer Other   . Diabetes Other   . Hypertension Other   . Breast cancer Mother   . Hypertension Mother   . Diabetes Father   . Hypertension Sister   . Heart disease Sister   . Hypertension Paternal Grandmother   . Colon cancer Neg Hx     Social History Social History   Socioeconomic History  . Marital status: Single  Spouse name: None  . Number of children: None  . Years of education: None  . Highest education level: None  Social Needs  . Financial resource strain: None  . Food insecurity - worry: None  . Food insecurity - inability: None  . Transportation needs - medical: None  . Transportation needs - non-medical: None  Occupational History  . None  Tobacco Use  . Smoking status: Current Some Day Smoker    Packs/day: 0.12    Years: 25.00    Pack years: 3.00    Types: Cigarettes  . Smokeless tobacco: Never Used  Substance and Sexual Activity  . Alcohol use: Yes    Comment: A couple  beers every few days   . Drug use: No    Comment: Last time for cocaine 05/09/17 ;Mariunina- last  time 05/14/17  . 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 None  Social History Narrative   ** Merged History Encounter **        Allergies No Known Allergies  Outpatient Meds Home medications from the H+P and/or nursing med reconciliation reviewed.  Inpatient med list reviewed  _____________________________________________________________________ Objective   Exam:  Current vital signs  Patient Vitals for the past 8 hrs:  BP Temp Temp src Pulse Resp SpO2  07/13/17 0511 108/70 98.4 F (36.9 C) Oral (!) 58 18 100 %    Intake/Output Summary (Last 24 hours) at 07/13/2017 0858 Last data filed at 07/13/2017 2505 Gross per 24 hour  Intake 2518.67 ml  Output 7 ml  Net 2511.67 ml    Physical Exam:    General: this is a chronically ill-appearing male patient in no acute distress.  He was sleeping, but awakens easily and has normal mental status  Eyes: sclera anicteric, no redness  ENT: oral mucosa moist without lesions, no cervical or supraclavicular lymphadenopathy, poor dentition with just some lower front teeth remaining  CV: RRR without murmur, S1/S2, no JVD,, no peripheral edema  Resp: clear to auscultation bilaterally, normal RR and effort noted  GI: soft, no tenderness, with active bowel sounds. No guarding or palpable organomegaly noted.  However, he has a markedly enlarged scrotum,.  Skin; warm and dry, no rash or jaundice noted  Neuro: awake, alert and oriented x 3. Normal gross motor function and fluent speech.  Labs:  Recent Labs  Lab 07/08/17 1033 07/12/17 0822 07/12/17 0829 07/13/17 0503  WBC 10.3 6.4  --  5.7  HGB 8.2* 7.5* 8.2* 7.6*  HCT 24.4* 21.3* 24.0* 21.6*  PLT 159 120*  --  124*   Last hemoglobin normal May 16, 2017  Recent Labs  Lab 07/13/17 0503  NA 126*  K 3.1*  CL 97*   CO2 21*  BUN 35*  ALBUMIN 1.9*  ALKPHOS 157*  ALT 74*  AST 107*  GLUCOSE 124*  Initial creatinine 1.6, down to 1.4 today Ammonia 66 Iron 20, TIBC 196 (both low) 10% saturation, ferritin 333  B12 normal at 3961 Last folate level was in 2017  Recent Labs  Lab 07/12/17 0854  INR 1.62    Radiologic studies: Last CT abdomen and pelvis January 2018 shows a cirrhotic appearing liver, no ascites, and a large right inguinal hernia into the scrotum  Abdominal ultrasound 07/12/2017 again reveals gallstones and normal caliber CBD.  However, a previously demonstrated liver mass has enlarged.  It should be noted this mass was biopsied in May 2017, pathology revealed "atypical  cells" but not felt to be hepatocellular carcinoma.  He had a chronically elevated AFP 34 in May 2017, 24 in April 2018  Other data: Colonoscopy report from [facility] in March 2015 was reportedly done for screening, there was a small left colon polyp of unknown pathology removed.  @ASSESSMENTPLANBEGIN @ Impression:  Macrocytic anemia, heme-negative, recent reported "dark stool", but it is not clear if this is really from GI blood loss. 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  He is agreeable after thorough discussion of procedure and risks.  The benefits and risks of the planned procedure were described in detail with the patient or (when appropriate) their health care proxy.  Risks were outlined as including, but not limited to, bleeding, infection, perforation, adverse medication reaction leading to cardiac  or pulmonary decompensation, or pancreatitis (if ERCP).  The limitation of incomplete mucosal visualization was also discussed.  No guarantees or warranties were given.  Patient at increased risk for cardiopulmonary complications of procedure due to medical comorbidities.   Thank you for the courtesy of this consult.  Please contact me with any questions or concerns.  [name] Pager: [phone] Mon-Fri 8a-5p [phone] after 5p, weekends, holidays

## 2017-07-13 NOTE — Consult Note (Signed)
[facility] Consult Note   History Jose [name] MRN # [id number]  Date of Admission: 07/12/2017 Date of Consultation: 07/13/2017 Referring physician: Dr. Cristy Folks, MD Primary Care Provider: [name], MD Primary Gastroenterologist: Dr. Wilfrid Lund   Reason for Consultation/Chief Complaint: Macrocytic anemia   Subjective  HPI:  This is a 62 year old man known to me from the outpatient setting, last office visit July 2018.  He has a history of cirrhosis related to ongoing alcohol abuse and hepatitis C that was treated last year and had not end of treatment response with negative PCR.  He also carries a diagnosis of hemochromatosis despite normal genetic testing in 2007.  He has had periodic phlebotomy by dermatology.  He was admitted through the ED for multiple somatic complaints including chest pain, muscle cramps, generalized fatigue.  He was noted last week in hematology clinic to be considerably anemic with a high MCV.  His last known hemoglobin was normal in mid January of this year, the day before dental extractions were performed.  Patient reports that he had little bleeding from that procedure, and none by the following day.  He is a vague historian, describes perhaps some dark stools a few days last week.  At the time of admission he was heme-negative and has had no overt GI bleeding since admission yesterday.  He denies bright red blood per rectum, black tarry stools, hematemesis, nausea, vomiting,  or weight loss.  He has some difficulty chewing since he only has a small number of teeth left after the previous extraction, and is also causing some feelings of difficulty swallowing.  Some admission chest pain is now resolved.  He describes erratic muscle cramps, especially at night. His alcohol level was negative upon admission, reported last drink sometime last week.  However, his UDS was positive for cocaine.  ROS:   All other systems are negative except  as noted above in the HPI  Past Medical History Past Medical History:  Diagnosis Date  . Alcohol abuse   . Alcohol dependence ([place])   . Alcoholism ([place]) 07/12/2015  . Ascites   . Cirrhosis ([place])   . Depression   . 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 Hepatitis C    Hepatitis C  . HOH (hard of hearing)   . Hypertension   . Inguinal hernia    right  . QT prolongation   . Shortness of breath    with exertion  . Thrombocytopenia [facility])     Past Surgical History Past Surgical History:  Procedure Laterality Date  . CIRCUMCISION    . COLONOSCOPY  march 2015   Dr. Renee Harder: nodular mucosa at appendiceal orifice, tubular adenoma, extremely poor prep  . COLONOSCOPY    . TOOTH EXTRACTION N/A 05/15/2017   Procedure: DENTAL RESTORATION and multiple teeth EXTRACTIONS;  Surgeon: Diona Browner, DDS;  Location: [facility];  Service: Oral Surgery;  Laterality: N/A;    Family History Family History  Problem Relation Age of Onset  . Breast cancer Other   . Diabetes Other   . Hypertension Other   . Breast cancer Mother   . Hypertension Mother   . Diabetes Father   . Hypertension Sister   . Heart disease Sister   . Hypertension Paternal Grandmother   . Colon cancer Neg Hx     Social History Social History   Socioeconomic History  . Marital status: Single  Spouse name: None  . Number of children: None  . Years of education: None  . Highest education level: None  Social Needs  . Financial resource strain: None  . Food insecurity - worry: None  . Food insecurity - inability: None  . Transportation needs - medical: None  . Transportation needs - non-medical: None  Occupational History  . None  Tobacco Use  . Smoking status: Current Some Day Smoker    Packs/day: 0.12    Years: 25.00    Pack years: 3.00    Types: Cigarettes  . Smokeless tobacco: Never Used  Substance and Sexual Activity  . Alcohol use: Yes    Comment: A couple  beers every few days   . Drug use: No    Comment: Last time for cocaine 05/09/17 ;Mariunina- last  time 05/14/17  . 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 None  Social History Narrative   ** Merged History Encounter **        Allergies No Known Allergies  Outpatient Meds Home medications from the H+P and/or nursing med reconciliation reviewed.  Inpatient med list reviewed  _____________________________________________________________________ Objective   Exam:  Current vital signs  Patient Vitals for the past 8 hrs:  BP Temp Temp src Pulse Resp SpO2  07/13/17 0511 108/70 98.4 F (36.9 C) Oral (!) 58 18 100 %    Intake/Output Summary (Last 24 hours) at 07/13/2017 0858 Last data filed at 07/13/2017 8413 Gross per 24 hour  Intake 2518.67 ml  Output 7 ml  Net 2511.67 ml    Physical Exam:    General: this is a chronically ill-appearing male patient in no acute distress.  He was sleeping, but awakens easily and has normal mental status  Eyes: sclera anicteric, no redness  ENT: oral mucosa moist without lesions, no cervical or supraclavicular lymphadenopathy, poor dentition with just some lower front teeth remaining  CV: RRR without murmur, S1/S2, no JVD,, no peripheral edema  Resp: clear to auscultation bilaterally, normal RR and effort noted  GI: soft, no tenderness, with active bowel sounds. No guarding or palpable organomegaly noted.  However, he has a markedly enlarged scrotum,.  Skin; warm and dry, no rash or jaundice noted  Neuro: awake, alert and oriented x 3. Normal gross motor function and fluent speech.  Labs:  Recent Labs  Lab 07/08/17 1033 07/12/17 0822 07/12/17 0829 07/13/17 0503  WBC 10.3 6.4  --  5.7  HGB 8.2* 7.5* 8.2* 7.6*  HCT 24.4* 21.3* 24.0* 21.6*  PLT 159 120*  --  124*   Last hemoglobin normal May 16, 2017  Recent Labs  Lab 07/13/17 0503  NA 126*  K 3.1*  CL 97*   CO2 21*  BUN 35*  ALBUMIN 1.9*  ALKPHOS 157*  ALT 74*  AST 107*  GLUCOSE 124*  Initial creatinine 1.6, down to 1.4 today Ammonia 66 Iron 20, TIBC 196 (both low) 10% saturation, ferritin 333  B12 normal at 3961 Last folate level was in 2017  Recent Labs  Lab 07/12/17 0854  INR 1.62    Radiologic studies: Last CT abdomen and pelvis January 2018 shows a cirrhotic appearing liver, no ascites, and a large right inguinal hernia into the scrotum  Abdominal ultrasound 07/12/2017 again reveals gallstones and normal caliber CBD.  However, a previously demonstrated liver mass has enlarged.  It should be noted this mass was biopsied in May 2017, pathology revealed "atypical  cells" but not felt to be hepatocellular carcinoma.  He had a chronically elevated AFP 34 in May 2017, 24 in April 2018  Other data: Colonoscopy report from [facility] in March 2015 was reportedly done for screening, there was a small left colon polyp of unknown pathology removed.  @ASSESSMENTPLANBEGIN @ Impression:  Macrocytic anemia, heme-negative, recent reported "dark stool", but it is not clear if this is really from GI blood loss. 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  He is agreeable after thorough discussion of procedure and risks.  The benefits and risks of the planned procedure were described in detail with the patient or (when appropriate) their health care proxy.  Risks were outlined as including, but not limited to, bleeding, infection, perforation, adverse medication reaction leading to cardiac  or pulmonary decompensation, or pancreatitis (if ERCP).  The limitation of incomplete mucosal visualization was also discussed.  No guarantees or warranties were given.  Patient at increased risk for cardiopulmonary complications of procedure due to medical comorbidities.   Thank you for the courtesy of this consult.  Please contact me with any questions or concerns.  [name] Pager: [phone] Mon-Fri 8a-5p [phone] after 5p, weekends, holidays

## 2017-07-13 NOTE — Anesthesia Postprocedure Evaluation (Signed)
Anesthesia Post Note  Patient: Jose [name]  Procedure(s) Performed: ESOPHAGOGASTRODUODENOSCOPY (EGD) WITH PROPOFOL (N/A )     Patient location during evaluation: PACU Anesthesia Type: MAC Level of consciousness: awake and alert Pain management: pain level controlled Vital Signs Assessment: post-procedure vital signs reviewed and stable Respiratory status: spontaneous breathing, nonlabored ventilation and respiratory function stable Cardiovascular status: stable and blood pressure returned to baseline Anesthetic complications: no    Last Vitals:  Vitals:   07/13/17 1420 07/13/17 1446  BP: 120/69 111/73  Pulse: (!) 56 (!) 52  Resp: 19 18  Temp:  36.8 C  SpO2: 100% 98%    Last Pain:  Vitals:   07/13/17 1446  TempSrc: Oral  PainSc:                  Audry Pili

## 2017-07-13 NOTE — Interval H&P Note (Signed)
History and Physical Interval Note:  07/13/2017 1:31 PM  Jose [name]  has presented today for surgery, with the diagnosis of anemia, -hx melena  The various methods of treatment have been discussed with the patient and family. After consideration of risks, benefits and other options for treatment, the patient has consented to  Procedure(s): ESOPHAGOGASTRODUODENOSCOPY (EGD) WITH PROPOFOL (N/A) as a surgical intervention .  The patient's history has been reviewed, patient examined, no change in status, stable for surgery.  I have reviewed the patient's chart and labs.  Questions were answered to the patient's satisfaction.     [name]

## 2017-07-13 NOTE — Anesthesia Procedure Notes (Signed)
Procedure Name: MAC Date/Time: 07/13/2017 1:50 PM Performed by: West Pugh, CRNA Pre-anesthesia Checklist: Patient identified, Emergency Drugs available, Suction available, Patient being monitored and Timeout performed Patient Re-evaluated:Patient Re-evaluated prior to induction Oxygen Delivery Method: Nasal cannula Placement Confirmation: positive ETCO2 Dental Injury: Teeth and Oropharynx as per pre-operative assessment

## 2017-07-13 NOTE — Transfer of Care (Signed)
Immediate Anesthesia Transfer of Care Note  Patient: Jose [name]  Procedure(s) Performed: ESOPHAGOGASTRODUODENOSCOPY (EGD) WITH PROPOFOL (N/A )  Patient Location: PACU  Anesthesia Type:MAC  Level of Consciousness: awake, alert  and patient cooperative  Airway & Oxygen Therapy: Patient Spontanous Breathing and Patient connected to nasal cannula oxygen  Post-op Assessment: Report given to RN and Post -op Vital signs reviewed and stable  Post vital signs: Reviewed and stable  Last Vitals:  Vitals:   07/13/17 1315 07/13/17 1410  BP: (!) 146/75 122/72  Pulse: (!) 50 70  Resp: (!) 21 18  Temp: 36.9 C   SpO2: 100% 100%    Last Pain:  Vitals:   07/13/17 1315  TempSrc: Oral  PainSc:       Patients Stated Pain Goal: 2 (50/56/97 9480)  Complications: No apparent anesthesia complications

## 2017-07-14 ENCOUNTER — Encounter (HOSPITAL_COMMUNITY): Payer: Self-pay | Admitting: Gastroenterology

## 2017-07-14 LAB — LACTATE DEHYDROGENASE: LDH: 175 U/L (ref 98–192)

## 2017-07-14 LAB — COMPREHENSIVE METABOLIC PANEL
ALT: 60 U/L (ref 17–63)
AST: 81 U/L — AB (ref 15–41)
Albumin: 2.8 g/dL — ABNORMAL LOW (ref 3.5–5.0)
Alkaline Phosphatase: 135 U/L — ABNORMAL HIGH (ref 38–126)
Anion gap: 6 (ref 5–15)
BILIRUBIN TOTAL: 1.9 mg/dL — AB (ref 0.3–1.2)
BUN: 26 mg/dL — ABNORMAL HIGH (ref 6–20)
CALCIUM: 7.9 mg/dL — AB (ref 8.9–10.3)
CO2: 22 mmol/L (ref 22–32)
CREATININE: 1.2 mg/dL (ref 0.61–1.24)
Chloride: 101 mmol/L (ref 101–111)
Glucose, Bld: 128 mg/dL — ABNORMAL HIGH (ref 65–99)
Potassium: 3.4 mmol/L — ABNORMAL LOW (ref 3.5–5.1)
Sodium: 129 mmol/L — ABNORMAL LOW (ref 135–145)
Total Protein: 5.4 g/dL — ABNORMAL LOW (ref 6.5–8.1)

## 2017-07-14 LAB — CBC
HEMATOCRIT: 19.6 % — AB (ref 39.0–52.0)
HEMOGLOBIN: 6.6 g/dL — AB (ref 13.0–17.0)
MCH: 35.9 pg — AB (ref 26.0–34.0)
MCHC: 33.7 g/dL (ref 30.0–36.0)
MCV: 106.5 fL — AB (ref 78.0–100.0)
Platelets: 91 10*3/uL — ABNORMAL LOW (ref 150–400)
RBC: 1.84 MIL/uL — AB (ref 4.22–5.81)
RDW: 15.9 % — ABNORMAL HIGH (ref 11.5–15.5)
WBC: 4.2 10*3/uL (ref 4.0–10.5)

## 2017-07-14 LAB — FOLATE RBC
Folate, RBC: >2661 ng/mL (ref >498)
Hematocrit: 23.3 % — ABNORMAL LOW (ref 37.5–51.0)

## 2017-07-14 LAB — AFP TUMOR MARKER: AFP, Serum, Tumor Marker: 16.7 ng/mL — ABNORMAL HIGH (ref 0.0–8.3)

## 2017-07-14 LAB — HEMOGLOBIN AND HEMATOCRIT, BLOOD
HCT: 24.6 % — ABNORMAL LOW (ref 39.0–52.0)
Hemoglobin: 8.3 g/dL — ABNORMAL LOW (ref 13.0–17.0)

## 2017-07-14 LAB — DIRECT ANTIGLOBULIN TEST (NOT AT ARMC)
DAT, IgG: NEGATIVE
DAT, complement: NEGATIVE

## 2017-07-14 LAB — PREPARE RBC (CROSSMATCH)

## 2017-07-14 MED ORDER — TRAMADOL HCL 50 MG PO TABS
ORAL_TABLET | ORAL | Status: AC
  Administered 2017-07-14: 50 mg via ORAL
  Filled 2017-07-14: qty 1

## 2017-07-14 MED ORDER — TRAMADOL HCL 50 MG PO TABS
50.0000 mg | ORAL_TABLET | Freq: Four times a day (QID) | ORAL | Status: DC | PRN
  Administered 2017-07-14: 50 mg via ORAL

## 2017-07-14 MED ORDER — ALUM & MAG HYDROXIDE-SIMETH 200-200-20 MG/5ML PO SUSP
30.0000 mL | ORAL | Status: DC | PRN
  Administered 2017-07-14: 30 mL via ORAL
  Filled 2017-07-14: qty 30

## 2017-07-14 MED ORDER — SODIUM CHLORIDE 0.9 % IV SOLN: Freq: Once | INTRAVENOUS | Status: DC

## 2017-07-14 NOTE — Progress Notes (Signed)
CRITICAL VALUE ALERT  Critical Value:  Hgb 6.6  Date & Time Notied:  07/14/2017 0546  Provider Notified: Tylene Fantasia, NP  Orders Received/Actions taken:

## 2017-07-14 NOTE — Progress Notes (Signed)
Report received from H. Holderness, RN.  Assessment unchanged. Jose [name] B  

## 2017-07-14 NOTE — Progress Notes (Signed)
PROGRESS NOTE    Jose [name]  BJS:[id number] DOB: 1956/04/24 DOA: 07/12/2017 PCP: [name], MD   Brief Narrative:  62 year old with past medical history relevant for alcoholic and hepatitis C cirrhosis with failure of treatment of hepatitis C complicated by recurrent hepatic encephalopathy, large liver mass of undetermined etiology with negative biopsy in 2017 with only mildly elevated AFP, polysubstance abuse including cocaine and alcohol, hypertension admitted with anemia and widespread lab abnormalities including AK I   Assessment & Plan:   Active Problems:   Thrombocytopenia (HCC)   Alcoholism ([place])   Alcoholic cirrhosis of liver with ascites (HCC)   Elevated LFTs   Right inguinal hernia   Essential hypertension   Protein-calorie malnutrition, severe (HCC)   Anemia of chronic disease   Hepatitis C, chronic (HCC)   End stage liver disease (HCC)   Pancytopenia, acquired (HCC)   Symptomatic anemia   Hyponatremia   Acute anemia   #) Microcytic anemia: EGD done on 07/13/2017 showed only small superficial duodenal ulcers with no clear source of significant anemia. -GI following appreciate recommendations -B12 appropriate, folate pending -Tsatt quite low consistent with iron deficiency, likely from occult GI losses, will start iron supplementation -DAT negative, LDH normal, haptoglobin pending  #) AK I: Resolving with IV fluids and albumin -IV albumin -Hold nephrotoxins including furosemide and spironolactone  #) Decompensated cirrhosis complicated by ascites and hepatic encephalopathy: Not a candidate for transplant due to alcoholism -Hold spironolactone -Hold furosemide -Continue lactulose and rifaximin 550 twice a day  #) Chronic hyponatremia: Likely acute on chronic in the setting of mild volume depletion.  Likely secondary to cirrhotic physiology.  Currently at baseline  #) Liver lesion: Patient was noted to have liver lesion that was initially suspicious  for hepatocellular carcinoma.  However biopsy showed only atypical cells and AFP was not significantly elevated only moderately so the setting of cirrhosis. - AFP 16.7  #) Hypertension: -Hold triamterene 37.5 mg daily -Hold HCTZ 25 mg daily  Fluids: IV fluids and albumin Electrodes: Monitor and supplement Nutrition: Sodium restricted diet  Prophylaxis: Subcu heparin  Disposition: Pending stabilization of anemia and GI recommendations  Full code  Consultants:   Gastroenterology  Procedures: (Don't include imaging studies which can be auto populated. Include things that cannot be auto populated i.e. Echo, Carotid and venous dopplers, Foley, Bipap, HD, tubes/drains, wound vac, central lines etc)  07/13/2017 EGD: Only superficial duodenal ulcers  Antimicrobials: (specify start and planned stop date. Auto populated tables are space occupying and do not give end dates)  None   Subjective: Patient reports he continues to feel somewhat poorly but better than when he came in.   Objective: Vitals:   07/13/17 2127 07/14/17 0503 07/14/17 0900 07/14/17 0935  BP: 114/78 107/65 114/61 110/72  Pulse: 69 64 (!) 55 (!) 56  Resp: 20 18 20 15   Temp: 98.2 F (36.8 C) 98 F (36.7 C) 98 F (36.7 C) 98.2 F (36.8 C)  TempSrc: Oral Oral Oral Oral  SpO2: 100% 98% 100% 100%  Weight:      Height:        Intake/Output Summary (Last 24 hours) at 07/14/2017 1136 Last data filed at 07/13/2017 1500 Gross per 24 hour  Intake 660 ml  Output 1 ml  Net 659 ml   Filed Weights   07/13/17 1315  Weight: 82.2 kg (181 lb 4.8 oz)    Examination:  General exam: Appears calm and comfortable  Respiratory system: Clear to auscultation. Respiratory effort normal.  Cardiovascular system: Regular rate and rhythm, distant heart sounds. Gastrointestinal system: Distended, shifting dullness, dull at flanks, plus bowel sounds Central nervous system: Alert and oriented. No focal neurological deficits.  No  asterixis Extremities: 1+ lower extremity edema Skin: No rashes on visible skin Psychiatry: Judgement and insight appear poor    Data Reviewed: I have personally reviewed following labs and imaging studies  CBC: Recent Labs  Lab 07/08/17 1033 07/12/17 0822 07/12/17 0829 07/12/17 1304 07/13/17 0503 07/14/17 0528  WBC 10.3 6.4  --   --  5.7 4.2  NEUTROABS 7.5*  --   --   --   --   --   HGB 8.2* 7.5* 8.2*  --  7.6* 6.6*  HCT 24.4* 21.3* 24.0* REJ5 21.6* 19.6*  MCV 113.9* 106.5*  --   --  106.9* 106.5*  PLT 159 120*  --   --  124* 91*   Basic Metabolic Panel: Recent Labs  Lab 07/12/17 0822 07/12/17 0829 07/12/17 0854 07/13/17 0503 07/14/17 0528  NA 124* 125*  --  126* 129*  K 3.6 3.7  --  3.1* 3.4*  CL 94* 92*  --  97* 101  CO2 21*  --   --  21* 22  GLUCOSE 127* 122*  --  124* 128*  BUN 43* 41*  --  35* 26*  CREATININE 1.64* 1.70*  --  1.41* 1.20  CALCIUM 7.7*  --   --  7.4* 7.9*  MG  --   --  2.2  --   --    GFR: Estimated Creatinine Clearance: 65.1 mL/min (by C-G formula based on SCr of 1.2 mg/dL). Liver Function Tests: Recent Labs  Lab 07/12/17 0822 07/13/17 0503 07/14/17 0528  AST 116* 107* 81*  ALT 84* 74* 60  ALKPHOS 159* 157* 135*  BILITOT 2.4* 2.1* 1.9*  PROT 5.4* 5.1* 5.4*  ALBUMIN 2.1* 1.9* 2.8*   Recent Labs  Lab 07/12/17 0800  LIPASE 43   Recent Labs  Lab 07/12/17 1304  AMMONIA 66*   Coagulation Profile: Recent Labs  Lab 07/12/17 0854  INR 1.62   Cardiac Enzymes: Recent Labs  Lab 07/12/17 0822  CKTOTAL 221   BNP (last 3 results) No results for input(s): PROBNP in the last 8760 hours. HbA1C: No results for input(s): HGBA1C in the last 72 hours. CBG: No results for input(s): GLUCAP in the last 168 hours. Lipid Profile: No results for input(s): CHOL, HDL, LDLCALC, TRIG, CHOLHDL, LDLDIRECT in the last 72 hours. Thyroid Function Tests: Recent Labs    07/12/17 0822  TSH 3.886   Anemia Panel: Recent Labs     07/12/17 0822  VITAMINB12 3,961*  TIBC 196*  IRON 20*   Sepsis Labs: No results for input(s): PROCALCITON, LATICACIDVEN in the last 168 hours.  No results found for this or any previous visit (from the past 240 hour(s)).       Radiology Studies: Ct Head Wo Contrast  Result Date: 07/12/2017 CLINICAL DATA:  Patient with left upper extremity numbness. EXAM: CT HEAD WITHOUT CONTRAST TECHNIQUE: Contiguous axial images were obtained from the base of the skull through the vertex without intravenous contrast. COMPARISON:  Brain CT 08/14/2016. FINDINGS: Brain: Ventricles and sulci are prominent compatible with atrophy. Periventricular and subcortical white matter hypodensity compatible with chronic microvascular ischemic changes. No evidence for acute cortically based infarct, intracranial hemorrhage, mass lesion or mass-effect. Vascular: Unremarkable. Skull: Intact. Sinuses/Orbits: Paranasal sinuses are well aerated. Mastoid air cells unremarkable. Orbits unremarkable. Chronic deformity of the  medial orbital walls bilaterally. Other: None. IMPRESSION: No acute intracranial process. Atrophy and chronic microvascular ischemic changes. Electronically Signed   By: Lovey Newcomer M.D.   On: 07/12/2017 12:46   US Abdomen Complete  Result Date: 07/12/2017 CLINICAL DATA:  Patient with history of hepatitis C, cirrhosis. Hypertension. Elevated LFTs. EXAM: ABDOMEN ULTRASOUND COMPLETE COMPARISON:  Ultrasound 03/27/2017. FINDINGS: Gallbladder: Sludge within the gallbladder lumen. Gallbladder wall thickening measuring up to 3.6 mm. Negative sonographic Murphy's sign. Common bile duct: Diameter: 4 mm Liver: The liver is heterogeneous in echogenicity and nodular in contour. Within the right hepatic lobe there is a 5.9 x 6.5 x 5.2 cm mixed echogenicity mass, previously measuring 4.4 x 3.9 x 4.2 cm. 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 IVC: No abnormality visualized. Pancreas: Visualized portion  unremarkable. Spleen: Size and appearance within normal limits. Right Kidney: Length: 11.5 cm. Echogenicity within normal limits. No mass or hydronephrosis visualized. Left Kidney: Length: 12.2 cm. Echogenicity within normal limits. No mass or hydronephrosis visualized. Abdominal aorta: No aneurysm visualized. Other findings: None. IMPRESSION: 1. Interval increase in size of mixed echogenicity mass within the right hepatic lobe, potentially representing hepatocellular carcinoma. 2. Stones and sludge within the gallbladder lumen with associated wall thickening. Wall thickening is nonspecific in the setting of cirrhosis. 3. Cirrhosis. 4. 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 Electronically Signed   By: Lovey Newcomer M.D.   On: 07/12/2017 12:42        Scheduled Meds: . lactulose  30 g Oral QID  . pantoprazole  40 mg Oral BID AC  . rifaximin  550 mg Oral BID   Continuous Infusions: . sodium chloride       LOS: 1 day    Time spent: [place], MD [facility]  If 7PM-7AM, please contact night-coverage [email or web address] Password [facility] 07/14/2017, 11:36 AM

## 2017-07-14 NOTE — Progress Notes (Addendum)
[facility] Progress Note  CC:  Anemia and liver mass  Subjective:  Patient begin transfused 2 units PRBC's for Hgb 6.6 grams this AM.  No sign of GI bleeding and no other new issues.  Discussed with patient's sister, [name], over the phone as well as she was on speaker-phone during our visit.  Objective:  Vital signs in last 24 hours: Temp:  [98 F (36.7 C)-98.6 F (37 C)] 98.2 F (36.8 C) (03/19 0935) Pulse Rate:  [50-70] 56 (03/19 0935) Resp:  [15-21] 15 (03/19 0935) BP: (107-146)/(61-78) 110/72 (03/19 0935) SpO2:  [98 %-100 %] 100 % (03/19 0935) Weight:  [181 lb 4.8 oz (82.2 kg)] 181 lb 4.8 oz (82.2 kg) (03/18 1315) Last BM Date: (07/14/2017) General:  Alert, Well-developed, in NAD Heart:  Regular rate and rhythm; no murmurs Pulm:  CTAB.  No increased WOB. Abdomen:  Soft, non-distended.  BS present.  Non-tender. Extremities:  Without edema. Neurologic:  Alert and oriented x 4;  grossly normal neurologically. Psych:  Alert and cooperative. Normal mood and affect.  Intake/Output from previous day: 03/18 0701 - 03/19 0700 In: 660 [P.O.:360; I.V.:300] Out: 2 [Urine:2]  Lab Results: Recent Labs    07/12/17 0822 07/12/17 0829 07/12/17 1304 07/13/17 0503 07/14/17 0528  WBC 6.4  --   --  5.7 4.2  HGB 7.5* 8.2*  --  7.6* 6.6*  HCT 21.3* 24.0* REJ5 21.6* 19.6*  PLT 120*  --   --  124* 91*   BMET Recent Labs    07/12/17 0822 07/12/17 0829 07/13/17 0503 07/14/17 0528  NA 124* 125* 126* 129*  K 3.6 3.7 3.1* 3.4*  CL 94* 92* 97* 101  CO2 21*  --  21* 22  GLUCOSE 127* 122* 124* 128*  BUN 43* 41* 35* 26*  CREATININE 1.64* 1.70* 1.41* 1.20  CALCIUM 7.7*  --  7.4* 7.9*   LFT Recent Labs    07/14/17 0528  PROT 5.4*  ALBUMIN 2.8*  AST 81*  ALT 60  ALKPHOS 135*  BILITOT 1.9*   PT/INR Recent Labs    07/12/17 0854  LABPROT 19.1*  INR 1.62   Ct Head Wo Contrast  Result Date: 07/12/2017 CLINICAL DATA:  Patient with left upper extremity  numbness. EXAM: CT HEAD WITHOUT CONTRAST TECHNIQUE: Contiguous axial images were obtained from the base of the skull through the vertex without intravenous contrast. COMPARISON:  Brain CT 08/14/2016. FINDINGS: Brain: Ventricles and sulci are prominent compatible with atrophy. Periventricular and subcortical white matter hypodensity compatible with chronic microvascular ischemic changes. No evidence for acute cortically based infarct, intracranial hemorrhage, mass lesion or mass-effect. Vascular: Unremarkable. Skull: Intact. Sinuses/Orbits: Paranasal sinuses are well aerated. Mastoid air cells unremarkable. Orbits unremarkable. Chronic deformity of the medial orbital walls bilaterally. Other: None. IMPRESSION: No acute intracranial process. Atrophy and chronic microvascular ischemic changes. Electronically Signed   By: Lovey Newcomer M.D.   On: 07/12/2017 12:46   US Abdomen Complete  Result Date: 07/12/2017 CLINICAL DATA:  Patient with history of hepatitis C, cirrhosis. Hypertension. Elevated LFTs. EXAM: ABDOMEN ULTRASOUND COMPLETE COMPARISON:  Ultrasound 03/27/2017. FINDINGS: Gallbladder: Sludge within the gallbladder lumen. Gallbladder wall thickening measuring up to 3.6 mm. Negative sonographic Murphy's sign. Common bile duct: Diameter: 4 mm Liver: The liver is heterogeneous in echogenicity and nodular in contour. Within the right hepatic lobe there is a 5.9 x 6.5 x 5.2 cm mixed echogenicity mass, previously measuring 4.4 x 3.9 x 4.2 cm. Bidirectional flow demonstrated within the portal  vein. IVC: No abnormality visualized. Pancreas: Visualized portion unremarkable. Spleen: Size and appearance within normal limits. Right Kidney: Length: 11.5 cm. Echogenicity within normal limits. No mass or hydronephrosis visualized. Left Kidney: Length: 12.2 cm. Echogenicity within normal limits. No mass or hydronephrosis visualized. Abdominal aorta: No aneurysm visualized. Other findings: None. IMPRESSION: 1. Interval increase  in size of mixed echogenicity mass within the right hepatic lobe, potentially representing hepatocellular carcinoma. 2. Stones and sludge within the gallbladder lumen with associated wall thickening. Wall thickening is nonspecific in the setting of cirrhosis. 3. Cirrhosis. 4. 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 Electronically Signed   By: Lovey Newcomer M.D.   On: 07/12/2017 12:42   Gastric biopsies negative for H. pylori  AFP 16.7  Assessment / Plan: *Macrocytic anemia, heme-negative, recent reported "dark stool", but it is not clear if this is really from GI blood loss.  Multiple superficial non-bleeding duodenal ulcers seen on EGD 3/18.  Not sure that these could be the sole explanation of his anemia.  Hgb down further this AM and he has had no evidence of GI bleeding.  Being transfused 2 units PRBC's for Hgb 6.6 grams.  Other hematology labs are pending, and depending on those results he may need hematology consult.  Dr. Alvy Bimler knows him well. *Cirrhosis from hepatitis C that has now been treated, but ongoing alcohol abuse *Substance abuse with positive cocaine urine drug screen on admission *Chronic coagulopathy and thrombocytopenia from cirrhosis *Large right inguinal hernia that requires surgical consultation, but will hold off until these other more pressing issues are addressed further. *Liver mass of uncertain behavior:  Had biopsy in the past the showed a regenerative nodule and AFP is elevated but is lower than it was previously, but is still concerning that that the size is increasing.  Dr. Loletha Carrow is going to speak with IR regarding re-biopsy, etc.   LOS: 1 day   Laban Emperor. Zehr  07/14/2017, 9:54 AM  Pager number [phone]   I have discussed the case with the PA, and that is the plan I formulated. I personally interviewed and examined the patient.  Kiara is doing well today, without chest pain or dyspnea, nausea or vomiting.  He describes some ill-defined right body  pain that runs from his neck down to his hip. His hemoglobin was down about a gram today from yesterday, but he still has demonstrated no overt GI bleeding. As such, it still seems to me that the shallow duodenal ulcerations do not entirely account for his worsening macrocytic anemia.  Folic acid level is pending.  It appears that hemolysis labs were ordered.  I think a hematology consult is in order at this point.  Another issue that is probably separate is this enlarging liver mass.  His AFP is elevated, but less so than it was nearly 2 years ago when this mass was biopsied.  See initial consult note for details of that.  However, there was atypical morphology of it even if not frankly malignant.  It may still be a large atypical regenerative nodule, but I am concerned that it could be a slow-growing hepatocellular carcinoma.  As such, I will discuss with interventional radiology the feasibility of a biopsy.  All this was discussed with Elena and his sister [name] who is present by phone for the entire encounter.   [name] Pager [phone]  Mon-Fri 8a-5p [phone] after 5p, weekends, holidays

## 2017-07-14 NOTE — Progress Notes (Signed)
PT Cancellation Note  Patient Details Name: Jose [name] MRN: [id number] DOB: [birth year]   Cancelled Treatment:    Reason Eval/Treat Not Completed: Medical issues which prohibited therapy(Hgb 6.6-down  from 7.6 yesterday)   [facility] 07/14/2017, 8:41 AM

## 2017-07-15 ENCOUNTER — Other Ambulatory Visit: Payer: Self-pay

## 2017-07-15 DIAGNOSIS — R160 Hepatomegaly, not elsewhere classified: Secondary | ICD-10-CM

## 2017-07-15 LAB — TYPE AND SCREEN
ABO/RH(D): O POS
ANTIBODY SCREEN: NEGATIVE
UNIT DIVISION: 0

## 2017-07-15 LAB — BASIC METABOLIC PANEL WITH GFR
CO2: 23 mmol/L (ref 22–32)
Chloride: 102 mmol/L (ref 101–111)
Creatinine, Ser: 1.08 mg/dL (ref 0.61–1.24)
Glucose, Bld: 124 mg/dL — ABNORMAL HIGH (ref 65–99)
Potassium: 3.5 mmol/L (ref 3.5–5.1)
Sodium: 130 mmol/L — ABNORMAL LOW (ref 135–145)

## 2017-07-15 LAB — CBC
HCT: 23.9 % — ABNORMAL LOW (ref 39.0–52.0)
Hemoglobin: 8.2 g/dL — ABNORMAL LOW (ref 13.0–17.0)
MCH: 35.2 pg — ABNORMAL HIGH (ref 26.0–34.0)
MCHC: 34.3 g/dL (ref 30.0–36.0)
MCV: 102.6 fL — ABNORMAL HIGH (ref 78.0–100.0)
Platelets: 95 10*3/uL — ABNORMAL LOW (ref 150–400)
RBC: 2.33 MIL/uL — ABNORMAL LOW (ref 4.22–5.81)
RDW: 17.7 % — ABNORMAL HIGH (ref 11.5–15.5)
WBC: 4.3 10*3/uL (ref 4.0–10.5)

## 2017-07-15 LAB — HEPATIC FUNCTION PANEL
ALT: 63 U/L (ref 17–63)
AST: 96 U/L — ABNORMAL HIGH (ref 15–41)
Albumin: 2.6 g/dL — ABNORMAL LOW (ref 3.5–5.0)
Alkaline Phosphatase: 171 U/L — ABNORMAL HIGH (ref 38–126)
Bilirubin, Direct: 1.1 mg/dL — ABNORMAL HIGH (ref 0.1–0.5)
Indirect Bilirubin: 1 mg/dL — ABNORMAL HIGH (ref 0.3–0.9)
Total Bilirubin: 2.1 mg/dL — ABNORMAL HIGH (ref 0.3–1.2)
Total Protein: 5.3 g/dL — ABNORMAL LOW (ref 6.5–8.1)

## 2017-07-15 LAB — BASIC METABOLIC PANEL
Anion gap: 5 (ref 5–15)
BUN: 18 mg/dL (ref 6–20)
Calcium: 7.7 mg/dL — ABNORMAL LOW (ref 8.9–10.3)
GFR calc Af Amer: >60 mL/min (ref >60)
GFR calc non Af Amer: >60 mL/min (ref >60)

## 2017-07-15 LAB — BPAM RBC
Blood Product Expiration Date: 201904102359
ISSUE DATE / TIME: 201903190917
Unit Type and Rh: 5100

## 2017-07-15 LAB — PROTIME-INR
INR: 1.54
PROTHROMBIN TIME: 18.3 s — AB (ref 11.4–15.2)

## 2017-07-15 LAB — HAPTOGLOBIN: Haptoglobin: 31 mg/dL — ABNORMAL LOW (ref 34–200)

## 2017-07-15 NOTE — Discharge Instructions (Signed)
Esophagogastroduodenoscopy, Care After °Refer to this sheet in the next few weeks. These instructions provide you with information about caring for yourself after your procedure. Your health care provider may also give you more specific instructions. Your treatment has been planned according to current medical practices, but problems sometimes occur. Call your health care provider if you have any problems or questions after your procedure. °What can I expect after the procedure? °After the procedure, it is common to have: °· A sore throat. °· Nausea. °· Bloating. °· Dizziness. °· Fatigue. ° °Follow these instructions at home: °· Do not eat or drink anything until the numbing medicine (local anesthetic) has worn off and your gag reflex has returned. You will know that the local anesthetic has worn off when you can swallow comfortably. °· Do not drive for 24 hours if you received a medicine to help you relax (sedative). °· If your health care provider took a tissue sample for testing during the procedure, make sure to get your test results. This is your responsibility. Ask your health care provider or the department performing the test when your results will be ready. °· Keep all follow-up visits as told by your health care provider. This is important. °Contact a health care provider if: °· You cannot stop coughing. °· You are not urinating. °· You are urinating less than usual. °Get help right away if: °· You have trouble swallowing. °· You cannot eat or drink. °· You have throat or chest pain that gets worse. °· You are dizzy or light-headed. °· You faint. °· You have nausea or vomiting. °· You have chills. °· You have a fever. °· You have severe abdominal pain. °· You have black, tarry, or bloody stools. °This information is not intended to replace advice given to you by your health care provider. Make sure you discuss any questions you have with your health care provider. °Document Released: 03/31/2012 Document  Revised: 09/20/2015 Document Reviewed: 03/08/2015 °Elsevier Interactive Patient Education © 2018 [employer] ° °

## 2017-07-15 NOTE — Progress Notes (Addendum)
[facility] Progress Note  CC:  Anemia and liver mass  Subjective:  Feels good.  Hgb stable this AM at 8.2 grams.  He has an appt with Dr. Alvy Bimler tomorrow, 3/21.  Objective:  Vital signs in last 24 hours: Temp:  [98 F (36.7 C)-98.5 F (36.9 C)] 98.5 F (36.9 C) (03/20 0450) Pulse Rate:  [51-60] 60 (03/20 0450) Resp:  [14-18] 18 (03/20 0450) BP: (101-119)/(63-69) 101/63 (03/20 0450) SpO2:  [100 %] 100 % (03/20 0450) Last BM Date: 07/15/17 General:  Alert, Well-developed, in NAD Heart:  Regular rate and rhythm; murmur noted Pulm:  CTAB.  No increased WOB. Abdomen:  Soft, non-distended.  BS present.  Non-tender. Extremities:  Without edema. Neurologic:  Alert and oriented x 4;  grossly normal neurologically. Psych:  Alert and cooperative. Normal mood and affect.  Intake/Output from previous day: 03/19 0701 - 03/20 0700 In: 579 [P.O.:240; Blood:339] Out: 175 [Urine:175]  Lab Results: Recent Labs    07/13/17 0503  07/14/17 0528 07/14/17 1459 07/15/17 0551  WBC 5.7  --  4.2  --  4.3  HGB 7.6*  --  6.6* 8.3* 8.2*  HCT 21.6*   < > 19.6* 24.6* 23.9*  PLT 124*  --  91*  --  95*   < > = values in this interval not displayed.   BMET Recent Labs    07/13/17 0503 07/14/17 0528 07/15/17 0551  NA 126* 129* 130*  K 3.1* 3.4* 3.5  CL 97* 101 102  CO2 21* 22 23  GLUCOSE 124* 128* 124*  BUN 35* 26* 18  CREATININE 1.41* 1.20 1.08  CALCIUM 7.4* 7.9* 7.7*   LFT Recent Labs    07/15/17 0551  PROT 5.3*  ALBUMIN 2.6*  AST 96*  ALT 63  ALKPHOS 171*  BILITOT 2.1*  BILIDIR 1.1*  IBILI 1.0*   PT/INR Recent Labs    07/15/17 0551  LABPROT 18.3*  INR 1.54   Assessment / Plan: *Macrocytic anemia, heme-negative, recent reported "dark stool", but it is not clear if this is really from GI blood loss.  Multiple superficial non-bleeding duodenal ulcers seen on EGD 3/18.  Not sure that these could be the sole explanation of his anemia.  Was transfused 2  units PRBC's for Hgb 6.6 grams on 3/19 and Hgb stable this AM at 8.2 grams.  Has appt with Dr. Alvy Bimler as an outpatient tomorrow, 3/21. *Cirrhosis from hepatitis C that has now been treated, but ongoing alcohol abuse *Substance abuse with positive cocaine urine drug screen on admission *Chronic coagulopathy and thrombocytopenia from cirrhosis *Large right inguinal hernia that requires surgical consultation, but will hold off until these other more pressing issues are addressed further. *Liver mass of uncertain behavior:  Had biopsy in the past the showed a regenerative nodule and AFP is elevated but is lower than it was previously, but is still concerning that that the size is increasing.  Dr. Loletha Carrow spoke with IR regarding re-biopsy and they would like a repeat MRI liver first.  This will be arranged by our office as an outpatient.    LOS: 2 days   Jose [name]  07/15/2017, 10:24 AM  Pager number [phone]   I have discussed the case with the PA, and that is the plan I formulated. I personally interviewed and examined the patient.  Still no overt GI bleeding. Hgb stable.  I reviewed his imaging and case with IR physician, who recommends MRI for direct comparison to prior imaging  study.  Then we can determine if biopsy is the right choice, esp considering increased risks of doing that in this patient.  His large right inguinal hernia needs surgical evaluation after determining plan for this liver mass.  My office will arrange outpatient MRI, and patient is aware.  He has hematology office evaluation tomorrow.  Total time 30 minutes, over half spent in case review with radiology, hospitalist and patient.    [name] Pager [phone]  Mon-Fri 8a-5p [phone] after 5p, weekends, holidays

## 2017-07-15 NOTE — Evaluation (Signed)
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  Feel safe for d/c to community without follow up PT needs.  Will sign off.    Follow Up Recommendations No PT follow up    Equipment Recommendations  None recommended by PT    Recommendations for Other Services       Precautions / Restrictions Precautions Precautions: None      Mobility  Bed Mobility               General bed mobility comments: up sitting on couch in room dressing  Transfers Overall transfer level: Independent                  Ambulation/Gait Ambulation/Gait assistance: Independent Ambulation Distance (Feet): 220 Feet Assistive device: None Gait Pattern/deviations: Step-through pattern;Wide base of support     General Gait Details: ssee DGI  Stairs Stairs: Yes Stairs assistance: Modified independent (Device/Increase time) Stair Management: One rail Right;Forwards;Alternating pattern Number of Stairs: 6    Wheelchair Mobility    Modified Rankin (Stroke Patients Only)       Balance                                 Standardized Balance Assessment Standardized Balance Assessment : Dynamic Gait Index   Dynamic Gait Index Level Surface: Mild Impairment Change in Gait Speed: Mild Impairment Gait with Horizontal Head Turns: Normal Gait  with Vertical Head Turns: Normal Gait and Pivot Turn: Normal Step Over Obstacle: Normal Step Around Obstacles: Normal Steps: Mild Impairment Total Score: 21       Pertinent Vitals/Pain Pain Assessment: No/denies pain    Home Living Family/patient expects to be discharged to:: Private residence Living Arrangements: Alone   Type of Home: Apartment Home Access: Elevator     Home Layout: One level Home Equipment: Environmental consultant - 2 wheels;Cane - single point      Prior Function Level of Independence: Independent               Hand Dominance        Extremity/Trunk Assessment   Upper Extremity Assessment Upper Extremity Assessment: Overall WFL for tasks assessed    Lower Extremity Assessment Lower Extremity Assessment: Overall WFL for tasks assessed       Communication   Communication: No difficulties  Cognition Arousal/Alertness: Awake/alert Behavior During Therapy: WFL for tasks assessed/performed Overall Cognitive Status: Within Functional Limits for tasks assessed                                        General Comments      Exercises     Assessment/Plan    PT Assessment Patent does not need any further PT services  PT Problem List         PT Treatment Interventions  PT Goals (Current goals can be found in the Care Plan section)  Acute Rehab PT Goals PT Goal Formulation: All assessment and education complete, DC therapy    Frequency     Barriers to discharge        Co-evaluation               AM-PAC PT "6 Clicks" Daily Activity  Outcome Measure Difficulty turning over in bed (including adjusting bedclothes, sheets and blankets)?: None Difficulty moving from lying on back to sitting on the side of the bed? : None Difficulty sitting down on and standing up from a chair with arms (e.g., wheelchair, bedside commode, etc,.)?: None Help needed moving to and from a bed to chair (including a wheelchair)?: None Help needed  walking in hospital room?: None Help needed climbing 3-5 steps with a railing? : None 6 Click Score: 24    End of Session   Activity Tolerance: Patient tolerated treatment well Patient left: Other (comment)(sitting on couch able to access needs)   PT Visit Diagnosis: Other abnormalities of gait and mobility (R26.89)    Time: 7939-0300 PT Time Calculation (min) (ACUTE ONLY): 11 min   Charges:   PT Evaluation $PT Eval Low Complexity: 1 Low     PT G CodesMagda Kiel, [place] [phone] 07/15/2017   Reginia Naas 07/15/2017, 11:11 AM

## 2017-07-15 NOTE — Discharge Summary (Addendum)
Physician Discharge Summary  Jose [name] EUM:[id number] DOB: August 08, 1955 DOA: 07/12/2017  PCP: [name], MD  Admit date: 07/12/2017 Discharge date: 07/15/2017  Admitted From: Home Disposition: Home  Recommendations for Outpatient Follow-up:  1. Follow up with PCP in 1-2 weeks 2. Please obtain CMP/CBC in one week 3. Please follow up with your liver doctor for possible MRI of your liver and an outpatient biopsy of your liver. 4. Please follow-up with your blood doctor tomorrow on 07/16/2017, Dr. Alvy Bimler  Home Health: No Equipment/Devices: No  Discharge Condition: Stable) CODE STATUS: Full Diet recommendation: Sodium restricted   Brief/Interim Summary:  #) Microcytic anemia: Patient was admitted with anemia without any clear source.  Labs were notable for no evidence of hemolysis, normal B12 and folate, evidence of iron deficiency.  An EGD showed only superficial duodenal ulcers.  He was given packed red blood cells with appropriate increase in hemoglobin.  He does have outpatient follow-up with hematology on 07/16/2017 with Dr. Alvy Bimler.  Iron supplementation was not started as there appears to be some concern about possible hemochromatosis despite his ethnic background.  He apparently did had negative genetic testing however did require phlebotomy frequently.  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  He was given IV fluids and IV albumin and his nephrotoxins including furosemide and spinal lactone were held.  His creatinine resolved to baseline.  #) Alcoholic and hep C cirrhosis: Patient was noted to have progressive cirrhosis on imaging.  He is apparently not a transplant candidate due to continued substance abuse.  He was restarted on his home spironolactone and furosemide dose prior to discharge.  He was continued on lactulose and rifaximin for hepatic encephalopathy.  #) Acute on chronic hyponatremia: Patient was noted to have acute on chronic hyponatremia likely secondary  to volume depletion.  With IV fluids and albumin his hyponatremia resolved to its baseline of around 128-130.  #) Liver lesion: Patient is noted to have liver lesion that was biopsied initially in 2017 that showed only atypical cells.  His AFP has been mildly elevated but not dramatically so.  Repeat imaging here showed progression of the lesion however his AFP was actually lower than before.  Hepatology felt the patient would benefit from having IR guided biopsy to further evaluate this lesion as it was initially felt to be only a large healing nodule.  Patient has been scheduled for an outpatient MRI of the liver and outpatient biopsy by IR.  #) Hypertension: Patient was on triamterene and HCTZ at home.  Due to his hyponatremia, his AK I, and the common kidneys him between spironolactone and triamterene which is a epithelial sodium channel inhibitor the patient was discontinued on this medication.  He will be sent to his PCP to see if he needs further blood pressure management at this time.  #) Is polysubstance abuse: Patient reports last drink was approximately 1 week ago, patient was noted to be positive for cocaine during this hospitalization.  Patient was counseled on substance abuse.  Discharge Diagnoses:  Active Problems:   Thrombocytopenia ([place])   Alcoholism ([place])   Alcoholic cirrhosis of liver with ascites ([facility])   Elevated LFTs   Right inguinal hernia   Essential hypertension   Protein-calorie malnutrition, severe ([facility])   Anemia of chronic disease   Hepatitis C, chronic ([facility])   End stage liver disease ([facility])   Pancytopenia, acquired ([facility])   Symptomatic anemia   Hyponatremia   Acute anemia    Discharge Instructions  Discharge Instructions    Call MD for:   Complete by:  As directed    Call MD for:  difficulty breathing, headache or visual disturbances   Complete by:  As directed    Call MD for:  persistant nausea and vomiting   Complete by:  As directed    Call MD for:   severe uncontrolled pain   Complete by:  As directed    Diet - low sodium heart healthy   Complete by:  As directed    Discharge instructions   Complete by:  As directed    Please follow-up with your liver doctor and your blood doctor.   Increase activity slowly   Complete by:  As directed      Allergies as of 07/15/2017   No Known Allergies     Medication List    STOP taking these medications   amoxicillin 500 MG capsule Commonly known as:  AMOXIL   ibuprofen 200 MG tablet Commonly known as:  ADVIL,MOTRIN   triamterene-hydrochlorothiazide 37.5-25 MG tablet Commonly known as:  MAXZIDE-25     TAKE these medications   fluticasone 50 MCG/ACT nasal spray Commonly known as:  FLONASE Place 1 spray into both nostrils daily.   furosemide 40 MG tablet Commonly known as:  LASIX Take 1 tablet (40 mg total) by mouth daily.   lactulose 10 GM/15ML solution Commonly known as:  CHRONULAC Take 45 mLs (30 g total) by mouth 4 (four) times daily. What changed:  when to take this   omeprazole 20 MG capsule Commonly known as:  PRILOSEC Take 1 capsule (20 mg total) by mouth at bedtime.   oxyCODONE-acetaminophen 5-325 MG tablet Commonly known as:  PERCOCET Take 1 tablet by mouth every 4 (four) hours as needed for severe pain.   PROAIR HFA 108 (90 Base) MCG/ACT inhaler Generic drug:  albuterol Inhale 2 puffs into the lungs 4 (four) times daily as needed for shortness of breath or wheezing.   rifaximin 550 MG Tabs tablet Commonly known as:  XIFAXAN Take 1 tablet (550 mg total) by mouth 2 (two) times daily.   spironolactone 100 MG tablet Commonly known as:  ALDACTONE TAKE 1 TABLET (100 MG TOTAL) BY MOUTH DAILY.   VITAMIN A PO Take 1 tablet by mouth daily.   VITAMIN D3 PO Take 1 capsule by mouth daily.       No Known Allergies  Consultations:  Hepatology   Procedures/Studies: Ct Head Wo Contrast  Result Date: 07/12/2017 CLINICAL DATA:  Patient with left upper  extremity numbness. EXAM: CT HEAD WITHOUT CONTRAST TECHNIQUE: Contiguous axial images were obtained from the base of the skull through the vertex without intravenous contrast. COMPARISON:  Brain CT 08/14/2016. FINDINGS: Brain: Ventricles and sulci are prominent compatible with atrophy. Periventricular and subcortical white matter hypodensity compatible with chronic microvascular ischemic changes. No evidence for acute cortically based infarct, intracranial hemorrhage, mass lesion or mass-effect. Vascular: Unremarkable. Skull: Intact. Sinuses/Orbits: Paranasal sinuses are well aerated. Mastoid air cells unremarkable. Orbits unremarkable. Chronic deformity of the medial orbital walls bilaterally. Other: None. IMPRESSION: No acute intracranial process. Atrophy and chronic microvascular ischemic changes. Electronically Signed   By: Lovey Newcomer M.D.   On: 07/12/2017 12:46   US Abdomen Complete  Result Date: 07/12/2017 CLINICAL DATA:  Patient with history of hepatitis C, cirrhosis. Hypertension. Elevated LFTs. EXAM: ABDOMEN ULTRASOUND COMPLETE COMPARISON:  Ultrasound 03/27/2017. FINDINGS: Gallbladder: Sludge within the gallbladder lumen. Gallbladder wall thickening measuring up to 3.6 mm. Negative sonographic Murphy's sign.  Common bile duct: Diameter: 4 mm Liver: The liver is heterogeneous in echogenicity and nodular in contour. Within the right hepatic lobe there is a 5.9 x 6.5 x 5.2 cm mixed echogenicity mass, previously measuring 4.4 x 3.9 x 4.2 cm. 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 IVC: No abnormality visualized. Pancreas: Visualized portion unremarkable. Spleen: Size and appearance within normal limits. Right Kidney: Length: 11.5 cm. Echogenicity within normal limits. No mass or hydronephrosis visualized. Left Kidney: Length: 12.2 cm. Echogenicity within normal limits. No mass or hydronephrosis visualized. Abdominal aorta: No aneurysm visualized. Other findings: None. IMPRESSION: 1. Interval  increase in size of mixed echogenicity mass within the right hepatic lobe, potentially representing hepatocellular carcinoma. 2. Stones and sludge within the gallbladder lumen with associated wall thickening. Wall thickening is nonspecific in the setting of cirrhosis. 3. Cirrhosis. 4. 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 Electronically Signed   By: Lovey Newcomer M.D.   On: 07/12/2017 12:42     Subjective:   Discharge Exam: Vitals:   07/14/17 2047 07/15/17 0450  BP: 107/69 101/63  Pulse: (!) 51 60  Resp: 18 18  Temp: 98.4 F (36.9 C) 98.5 F (36.9 C)  SpO2: 100% 100%   Vitals:   07/14/17 0935 07/14/17 1306 07/14/17 2047 07/15/17 0450  BP: 110/72 119/69 107/69 101/63  Pulse: (!) 56 (!) 56 (!) 51 60  Resp: 15 14 18 18   Temp: 98.2 F (36.8 C) 98 F (36.7 C) 98.4 F (36.9 C) 98.5 F (36.9 C)  TempSrc: Oral Oral Oral Oral  SpO2: 100% 100% 100% 100%  Weight:      Height:         General exam: Appears calm and comfortable  Respiratory system: Clear to auscultation. Respiratory effort normal. Cardiovascular system: Regular rate and rhythm, distant heart sounds. Gastrointestinal system: Distended, shifting dullness, dull at flanks, plus bowel sounds Central nervous system: Alert and oriented. No focal neurological deficits.  No asterixis Extremities: 1+ lower extremity edema Skin: No rashes on visible skin Psychiatry: Judgement and insight appear poor      The results of significant diagnostics from this hospitalization (including imaging, microbiology, ancillary and laboratory) are listed below for reference.     Microbiology: No results found for this or any previous visit (from the past 240 hour(s)).   Labs: BNP (last 3 results) No results for input(s): BNP in the last 8760 hours. Basic Metabolic Panel: Recent Labs  Lab 07/12/17 0822 07/12/17 0829 07/12/17 0854 07/13/17 0503 07/14/17 0528 07/15/17 0551  NA 124* 125*  --  126* 129* 130*  K  3.6 3.7  --  3.1* 3.4* 3.5  CL 94* 92*  --  97* 101 102  CO2 21*  --   --  21* 22 23  GLUCOSE 127* 122*  --  124* 128* 124*  BUN 43* 41*  --  35* 26* 18  CREATININE 1.64* 1.70*  --  1.41* 1.20 1.08  CALCIUM 7.7*  --   --  7.4* 7.9* 7.7*  MG  --   --  2.2  --   --   --    Liver Function Tests: Recent Labs  Lab 07/12/17 0822 07/13/17 0503 07/14/17 0528 07/15/17 0551  AST 116* 107* 81* 96*  ALT 84* 74* 60 63  ALKPHOS 159* 157* 135* 171*  BILITOT 2.4* 2.1* 1.9* 2.1*  PROT 5.4* 5.1* 5.4* 5.3*  ALBUMIN 2.1* 1.9* 2.8* 2.6*   Recent Labs  Lab 07/12/17 0800  LIPASE 43  Recent Labs  Lab 07/12/17 1304  AMMONIA 66*   CBC: Recent Labs  Lab 07/12/17 0822 07/12/17 0829  07/13/17 0503 07/13/17 0944 07/14/17 0528 07/14/17 1459 07/15/17 0551  WBC 6.4  --   --  5.7  --  4.2  --  4.3  HGB 7.5* 8.2*  --  7.6*  --  6.6* 8.3* 8.2*  HCT 21.3* 24.0*   < > 21.6* 23.3* 19.6* 24.6* 23.9*  MCV 106.5*  --   --  106.9*  --  106.5*  --  102.6*  PLT 120*  --   --  124*  --  91*  --  95*   < > = values in this interval not displayed.   Cardiac Enzymes: Recent Labs  Lab 07/12/17 0822  CKTOTAL 221   BNP: Invalid input(s): POCBNP CBG: No results for input(s): GLUCAP in the last 168 hours. D-Dimer No results for input(s): DDIMER in the last 72 hours. Hgb A1c No results for input(s): HGBA1C in the last 72 hours. Lipid Profile No results for input(s): CHOL, HDL, LDLCALC, TRIG, CHOLHDL, LDLDIRECT in the last 72 hours. Thyroid function studies No results for input(s): TSH, T4TOTAL, T3FREE, THYROIDAB in the last 72 hours.  Invalid input(s): FREET3 Anemia work up No results for input(s): VITAMINB12, FOLATE, FERRITIN, TIBC, IRON, RETICCTPCT in the last 72 hours. Urinalysis    Component Value Date/Time   COLORURINE AMBER (A) 07/12/2017 0835   APPEARANCEUR CLEAR 07/12/2017 0835   LABSPEC 1.018 07/12/2017 0835   PHURINE 5.0 07/12/2017 0835   GLUCOSEU NEGATIVE 07/12/2017 0835   HGBUR  SMALL (A) 07/12/2017 0835   BILIRUBINUR NEGATIVE 07/12/2017 0835   KETONESUR NEGATIVE 07/12/2017 0835   PROTEINUR NEGATIVE 07/12/2017 0835   UROBILINOGEN 4.0 (H) 04/26/2013 0500   NITRITE NEGATIVE 07/12/2017 0835   LEUKOCYTESUR NEGATIVE 07/12/2017 0835   Sepsis Labs Invalid input(s): PROCALCITONIN,  WBC,  LACTICIDVEN Microbiology No results found for this or any previous visit (from the past 240 hour(s)).   Time coordinating discharge: Over 30 minutes  SIGNED:   Cristy Folks, MD  [facility] 07/15/2017, 11:06 AM   If 7PM-7AM, please contact night-coverage [email or web address] Password [email or web address]

## 2017-07-15 NOTE — Care Management Note (Signed)
Case Management Note  Patient Details  Name: XAN INGRAHAM MRN: [id number] Date of Birth: 22-Sep-1955  Subjective/Objective:                    Action/Plan:d/c home.   Expected Discharge Date:  07/15/17               Expected Discharge Plan:  Home/Self Care  In-House Referral:     Discharge planning Services  CM Consult  Post Acute Care Choice:    Choice offered to:     DME Arranged:    DME Agency:     HH Arranged:    HH Agency:     Status of Service:  Completed, signed off  If discussed at [employer] of Stay Meetings, dates discussed:    Additional Comments:  Dessa Phi, RN 07/15/2017, 11:28 AM

## 2017-07-16 ENCOUNTER — Inpatient Hospital Stay (HOSPITAL_BASED_OUTPATIENT_CLINIC_OR_DEPARTMENT_OTHER): Payer: Medicaid Other | Admitting: Hematology and Oncology

## 2017-07-16 ENCOUNTER — Encounter: Payer: Self-pay | Admitting: Hematology and Oncology

## 2017-07-16 ENCOUNTER — Other Ambulatory Visit: Payer: Self-pay | Admitting: Hematology and Oncology

## 2017-07-16 ENCOUNTER — Inpatient Hospital Stay: Payer: Medicaid Other

## 2017-07-16 DIAGNOSIS — D61818 Other pancytopenia: Secondary | ICD-10-CM

## 2017-07-16 DIAGNOSIS — F1020 Alcohol dependence, uncomplicated: Secondary | ICD-10-CM | POA: Diagnosis not present

## 2017-07-16 DIAGNOSIS — D539 Nutritional anemia, unspecified: Secondary | ICD-10-CM | POA: Diagnosis not present

## 2017-07-16 DIAGNOSIS — R160 Hepatomegaly, not elsewhere classified: Secondary | ICD-10-CM

## 2017-07-16 DIAGNOSIS — K7031 Alcoholic cirrhosis of liver with ascites: Secondary | ICD-10-CM | POA: Diagnosis not present

## 2017-07-16 DIAGNOSIS — K219 Gastro-esophageal reflux disease without esophagitis: Secondary | ICD-10-CM | POA: Diagnosis not present

## 2017-07-16 LAB — CBC WITH DIFFERENTIAL/PLATELET
BASOS ABS: 0.1 10*3/uL (ref 0.0–0.1)
Basophils Relative: 2 %
EOS ABS: 0.1 10*3/uL (ref 0.0–0.5)
EOS PCT: 2 %
HCT: 28.2 % — ABNORMAL LOW (ref 38.4–49.9)
Hemoglobin: 9.1 g/dL — ABNORMAL LOW (ref 13.0–17.1)
Lymphocytes Relative: 10 %
Lymphs Abs: 0.4 10*3/uL — ABNORMAL LOW (ref 0.9–3.3)
MCH: 34 pg — ABNORMAL HIGH (ref 27.2–33.4)
MCHC: 32.4 g/dL (ref 32.0–36.0)
MCV: 104.9 fL — ABNORMAL HIGH (ref 79.3–98.0)
MONO ABS: 0.7 10*3/uL (ref 0.1–0.9)
Monocytes Relative: 20 %
Neutro Abs: 2.5 10*3/uL (ref 1.5–6.5)
Neutrophils Relative %: 66 %
PLATELETS: 108 10*3/uL — AB (ref 140–400)
RBC: 2.68 MIL/uL — AB (ref 4.20–5.82)
RDW: 19.9 % — AB (ref 11.0–14.6)
WBC: 3.7 10*3/uL — AB (ref 4.0–10.3)

## 2017-07-16 LAB — COMPREHENSIVE METABOLIC PANEL
ALT: 77 U/L — ABNORMAL HIGH (ref 0–55)
AST: 107 U/L — ABNORMAL HIGH (ref 5–34)
Albumin: 2.8 g/dL — ABNORMAL LOW (ref 3.5–5.0)
Alkaline Phosphatase: 218 U/L — ABNORMAL HIGH (ref 40–150)
Anion gap: 6 (ref 3–11)
BUN: 14 mg/dL (ref 7–26)
CHLORIDE: 103 mmol/L (ref 98–109)
CO2: 22 mmol/L (ref 22–29)
Calcium: 7.9 mg/dL — ABNORMAL LOW (ref 8.4–10.4)
Creatinine, Ser: 1.15 mg/dL (ref 0.70–1.30)
Glucose, Bld: 146 mg/dL — ABNORMAL HIGH (ref 70–140)
POTASSIUM: 3.4 mmol/L — AB (ref 3.5–5.1)
Sodium: 131 mmol/L — ABNORMAL LOW (ref 136–145)
TOTAL PROTEIN: 6 g/dL — AB (ref 6.4–8.3)
Total Bilirubin: 2.5 mg/dL — ABNORMAL HIGH (ref 0.2–1.2)

## 2017-07-16 LAB — SAMPLE TO BLOOD BANK

## 2017-07-16 LAB — VITAMIN B12: VITAMIN B 12: 3203 pg/mL — AB (ref 180–914)

## 2017-07-16 LAB — SEDIMENTATION RATE: Sed Rate: 14 mm/hr (ref 0–16)

## 2017-07-16 NOTE — Assessment & Plan Note (Signed)
He continues to have chronic pancytopenia, likely due to liver cirrhosis, recent bleeding and consumption He is not symptomatic I recommend close blood count monitoring We discussed the importance of abstinence from alcoholism

## 2017-07-16 NOTE — Assessment & Plan Note (Signed)
His last alcohol intake was 2 weeks ago We have extensive discussion about the importance of quitting alcohol intake

## 2017-07-16 NOTE — Progress Notes (Signed)
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

## 2017-07-16 NOTE — Assessment & Plan Note (Signed)
Recent ultrasound showed mildly enlarging liver mass Previous biopsy was nondiagnostic Further evaluation with MRI is pending Tumor marker was only marginally elevated I discussed this with his liver specialist I will wait for MRI report before deciding whether he needs to be seen back for treatment of hepatocellular carcinoma

## 2017-07-16 NOTE — Progress Notes (Signed)
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, likely due to liver cirrhosis, recent bleeding and consumption He is not symptomatic I recommend close blood count monitoring We discussed the importance of abstinence from alcoholism  Alcoholism ([name]) His last alcohol intake was 2 weeks ago We have extensive discussion about the importance of quitting alcohol intake  Liver mass Recent ultrasound showed mildly enlarging liver mass Previous biopsy was nondiagnostic Further evaluation with MRI is pending Tumor marker was only marginally elevated I discussed this with his liver specialist I will wait for MRI report before deciding whether he needs to be seen back for treatment of hepatocellular carcinoma   No orders of the defined types were placed in this encounter.   INTERVAL HISTORY: Jose [name] 62 y.o. male returns for further follow-up He is here accompanied by his sister He was recently hospitalized for further evaluation and management He had upper endoscopy which showed no significant ulcer that could explain reason severe anemia The patient denies any recent signs or symptoms of bleeding such as spontaneous epistaxis, hematuria or hematochezia. He has no recent report of encephalopathy Ultrasound of his liver review abnormal enlarging mass Liver MRI is pending His last alcohol intake was 2 weeks ago  SUMMARY OF HEMATOLOGIC HISTORY:  He was found to have abnormal CBC from recent blood work. This patient have chronic untreated hepatitis C, liver disease with cirrhosis and chronic alcoholism. He was recently discharged from the hospital with acute encephalopathy which improved upon supportive care management. He denies recent bruising/bleeding, such as spontaneous epistaxis, hematuria, melena or hematochezia The patient had poor social circumstances. He  lives in a group home/facility. He continues to drink on a regular basis, last alcohol intake was over a day ago. He has not been treated for hepatitis C, presumably due to noncompliance and chronic alcoholism. He has chronic right lower abdominal pain from abdominal hernia but that was not surgically repaired due to chronic thrombocytopenia In May, he had extensive workup including liver biopsy which excluded hepatoma Starting September 2017, he had regular phlebotomy every 3 weeks Starting March 2018, the frequency of phlebotomy is reduced to every 3 months due to reduced ferritin level and was subsequently stopped On 07/13/2017, he underwent EGD which showed normal esophagus. Gastric erosions without bleeding. Biopsied. Multiple non-bleeding duodenal ulcers with no stigmata of bleeding. US liver on 07/13/17 showed                          1. Interval increase in size of mixed echogenicity mass within the right hepatic lobe, potentially representing hepatocellular carcinoma. 2. Stones and sludge within the gallbladder lumen with associated wall thickening. Wall thickening is nonspecific in the setting of cirrhosis. 3. Cirrhosis. 4. 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  I have reviewed the past medical history, past surgical history, social history and family history with the patient and they are unchanged from previous note.  ALLERGIES:  has No Known Allergies.  MEDICATIONS:  Current Outpatient Medications  Medication Sig Dispense Refill  . Cholecalciferol (VITAMIN D3 PO) Take 1 capsule by mouth daily.    . fluticasone (FLONASE) 50 MCG/ACT nasal spray Place 1 spray into both nostrils daily.     . furosemide (LASIX) 40 MG tablet Take 1 tablet (40 mg total) by mouth daily. (Patient not taking: Reported on 05/11/2017) 30 tablet 2  .  lactulose (CHRONULAC) 10 GM/15ML solution Take 45 mLs (30 g total) by mouth 4 (four) times daily. (Patient taking differently: Take 30 g by mouth 3  (three) times daily. ) 500 mL 2  . omeprazole (PRILOSEC) 20 MG capsule Take 1 capsule (20 mg total) by mouth at bedtime. 30 capsule 11  . oxyCODONE-acetaminophen (PERCOCET) 5-325 MG tablet Take 1 tablet by mouth every 4 (four) hours as needed for severe pain. (Patient not taking: Reported on 07/12/2017) 20 tablet 0  . PROAIR HFA 108 (90 Base) MCG/ACT inhaler Inhale 2 puffs into the lungs 4 (four) times daily as needed for shortness of breath or wheezing.  3  . rifaximin (XIFAXAN) 550 MG TABS tablet Take 1 tablet (550 mg total) by mouth 2 (two) times daily. 60 tablet 6  . spironolactone (ALDACTONE) 100 MG tablet TAKE 1 TABLET (100 MG TOTAL) BY MOUTH DAILY. (Patient not taking: Reported on 05/11/2017) 30 tablet 2  . VITAMIN A PO Take 1 tablet by mouth daily.     No current facility-administered medications for this visit.      REVIEW OF SYSTEMS:   Constitutional: Denies fevers, chills or night sweats Eyes: Denies blurriness of vision Ears, nose, mouth, throat, and face: Denies mucositis or sore throat Respiratory: Denies cough, dyspnea or wheezes Cardiovascular: Denies palpitation, chest discomfort or lower extremity swelling Gastrointestinal:  Denies nausea, heartburn or change in bowel habits Skin: Denies abnormal skin rashes Lymphatics: Denies new lymphadenopathy or easy bruising Neurological:Denies numbness, tingling or new weaknesses Behavioral/Psych: Mood is stable, no new changes  All other systems were reviewed with the patient and are negative.  PHYSICAL EXAMINATION: ECOG PERFORMANCE STATUS: 2 - Symptomatic, <50% confined to bed  Vitals:   07/16/17 1304  BP: 100/87  Pulse: (!) 58  Resp: 18  Temp: (!) 97.5 F (36.4 C)  SpO2: 100%   Filed Weights   07/16/17 1304  Weight: 190 lb 3.2 oz (86.3 kg)    GENERAL:alert, no distress and comfortable SKIN: skin color, texture, turgor are normal, no rashes or significant lesions EYES: Noted mild scleral icterus OROPHARYNX:no  exudate, no erythema and lips, buccal mucosa, and tongue normal  NECK: supple, thyroid normal size, non-tender, without nodularity LYMPH:  no palpable lymphadenopathy in the cervical, axillary or inguinal LUNGS: clear to auscultation and percussion with normal breathing effort HEART: Soft systolic murmur is noted on the left sternal border ABDOMEN:abdomen soft, grossly distended with ascites.  No palpable splenomegaly  Musculoskeletal:no cyanosis of digits and no clubbing  NEURO: alert & oriented x 3 with fluent speech, no focal motor/sensory deficits  LABORATORY DATA:  I have reviewed the data as listed     Component Value Date/Time   NA 131 (L) 07/16/2017 1158   NA 137 01/04/2016 0809   K 3.4 (L) 07/16/2017 1158   K 4.5 01/04/2016 0809   CL 103 07/16/2017 1158   CO2 22 07/16/2017 1158   CO2 18 (L) 01/04/2016 0809   GLUCOSE 146 (H) 07/16/2017 1158   GLUCOSE 109 01/04/2016 0809   BUN 14 07/16/2017 1158   BUN 14.5 01/04/2016 0809   CREATININE 1.15 07/16/2017 1158   CREATININE 1.03 03/25/2017 1515   CREATININE 1.0 01/04/2016 0809   CALCIUM 7.9 (L) 07/16/2017 1158   CALCIUM 8.3 (L) 01/04/2016 0809   PROT 6.0 (L) 07/16/2017 1158   PROT 6.8 01/04/2016 0809   ALBUMIN 2.8 (L) 07/16/2017 1158   ALBUMIN 2.3 (L) 01/04/2016 0809   AST 107 (H) 07/16/2017 1158   AST  98 (H) 01/04/2016 0809   ALT 77 (H) 07/16/2017 1158   ALT 52 01/04/2016 0809   ALKPHOS 218 (H) 07/16/2017 1158   ALKPHOS 231 (H) 01/04/2016 0809   BILITOT 2.5 (H) 07/16/2017 1158   BILITOT 2.10 (H) 01/04/2016 0809   GFRNONAA >60 07/16/2017 1158   GFRNONAA 79 03/25/2017 1515   GFRAA >60 07/16/2017 1158   GFRAA 91 03/25/2017 1515    No results found for: SPEP, UPEP  Lab Results  Component Value Date   WBC 3.7 (L) 07/16/2017   NEUTROABS 2.5 07/16/2017   HGB 9.1 (L) 07/16/2017   HCT 28.2 (L) 07/16/2017   MCV 104.9 (H) 07/16/2017   PLT 108 (L) 07/16/2017      Chemistry      Component Value Date/Time   NA 131  (L) 07/16/2017 1158   NA 137 01/04/2016 0809   K 3.4 (L) 07/16/2017 1158   K 4.5 01/04/2016 0809   CL 103 07/16/2017 1158   CO2 22 07/16/2017 1158   CO2 18 (L) 01/04/2016 0809   BUN 14 07/16/2017 1158   BUN 14.5 01/04/2016 0809   CREATININE 1.15 07/16/2017 1158   CREATININE 1.03 03/25/2017 1515   CREATININE 1.0 01/04/2016 0809   GLU 117 09/12/2015      Component Value Date/Time   CALCIUM 7.9 (L) 07/16/2017 1158   CALCIUM 8.3 (L) 01/04/2016 0809   ALKPHOS 218 (H) 07/16/2017 1158   ALKPHOS 231 (H) 01/04/2016 0809   AST 107 (H) 07/16/2017 1158   AST 98 (H) 01/04/2016 0809   ALT 77 (H) 07/16/2017 1158   ALT 52 01/04/2016 0809   BILITOT 2.5 (H) 07/16/2017 1158   BILITOT 2.10 (H) 01/04/2016 0809       I spent 20 minutes counseling the patient face to face. The total time spent in the appointment was 25 minutes and more than 50% was on counseling.   All questions were answered. The patient knows to call the clinic with any problems, questions or concerns. No barriers to learning was detected.    Heath Lark, Jose [name] 3/21/20193:58 PM

## 2017-07-17 LAB — FOLATE RBC
Folate, Hemolysate: >620 ng/mL
Folate, RBC: >2191 ng/mL (ref >498)
HEMATOCRIT: 28.3 % — AB (ref 37.5–51.0)

## 2017-07-21 ENCOUNTER — Ambulatory Visit (HOSPITAL_COMMUNITY)
Admission: RE | Admit: 2017-07-21 | Discharge: 2017-07-21 | Disposition: A | Discharge status: Home / Self Care | Payer: Medicaid Other | Source: Ambulatory Visit | Attending: Gastroenterology | Admitting: Gastroenterology

## 2017-07-21 DIAGNOSIS — R160 Hepatomegaly, not elsewhere classified: Secondary | ICD-10-CM

## 2017-07-21 DIAGNOSIS — K7460 Unspecified cirrhosis of liver: Secondary | ICD-10-CM | POA: Insufficient documentation

## 2017-07-21 DIAGNOSIS — R188 Other ascites: Secondary | ICD-10-CM | POA: Diagnosis not present

## 2017-07-21 DIAGNOSIS — K766 Portal hypertension: Secondary | ICD-10-CM | POA: Insufficient documentation

## 2017-07-21 DIAGNOSIS — J90 Pleural effusion, not elsewhere classified: Secondary | ICD-10-CM | POA: Insufficient documentation

## 2017-07-21 MED ORDER — GADOBENATE DIMEGLUMINE 529 MG/ML IV SOLN
20.0000 mL | Freq: Once | INTRAVENOUS | Status: AC | PRN
  Administered 2017-07-21: 18 mL via INTRAVENOUS

## 2017-07-22 ENCOUNTER — Other Ambulatory Visit: Payer: Self-pay

## 2017-07-22 DIAGNOSIS — R160 Hepatomegaly, not elsewhere classified: Secondary | ICD-10-CM

## 2017-07-23 ENCOUNTER — Telehealth: Payer: Self-pay

## 2017-07-23 MED FILL — oxyCODONE HCL 5 MG TABS: 5 | 5 days supply | Qty: 20 | Fill #0

## 2017-07-23 NOTE — Telephone Encounter (Signed)
Dr. Barbie Banner, radiologist, called and after reviewing patient records unfortunately he cannot safely do a liver biopsy due to the amount of ascites. He will be at available on Monday 07/27/17 after 1:30 if you need to speak to him, [phone].

## 2017-07-29 NOTE — Telephone Encounter (Signed)
Today I spoke with Dr. Benedetto Goad of IR.  We discussed the case, and he reviewed the recent MRI liver. Dr. Jarvis Newcomer agrees the procedure carries increased risk of infection or leakage from biopsy site because of the ascites, but he believes it can be done if a paracentesis is performed before the biopsy on the same day.  I agree that this is certainly less risk than sending the patient to hepatobiliary surgery for a a surgical biopsy.  Dr. Jarvis Newcomer has re-activated the order for biopsy.  He requested an order for paracentesis as well.  Please send that to IR and make note that it is to be done the day of liver biopsy.  Dr. Jarvis Newcomer told me he would make notation in their system to that effect. He is aware of recent platelet and INR levels.  Let Jose [name] know this is the plan.

## 2017-07-30 ENCOUNTER — Other Ambulatory Visit: Payer: Self-pay

## 2017-07-30 DIAGNOSIS — R160 Hepatomegaly, not elsewhere classified: Secondary | ICD-10-CM

## 2017-07-30 DIAGNOSIS — K7031 Alcoholic cirrhosis of liver with ascites: Secondary | ICD-10-CM

## 2017-07-30 NOTE — Telephone Encounter (Signed)
I s there a max for fluid removal for paracentesis?  Do you want albumin orders?

## 2017-07-30 NOTE — Telephone Encounter (Signed)
Thank you for checking. You do not need to speficy the volume - radiology will remove as much as they need to in order to perform the biopsy.  Albumin not needed, but he should not take his spironolactone and furosemide the day of procedure.

## 2017-07-30 NOTE — Telephone Encounter (Signed)
Order in for IR Paracentesis.  Left message for patient to return call to discuss treatment plan.

## 2017-07-31 NOTE — Telephone Encounter (Signed)
Called and left message for patient to call back yesterday, 4/4, have not heard back from him. Called today and left detailed message about paracentesis and biopsy of mass at [facility] on 4/9. Instructed to be NPO after midnight and to have a driver with him for after the biopsy. Asked that he please call the office to let us know he received this message. Also reminded him of the follow up visit on 4/10 with Dr. Loletha Carrow.

## 2017-08-03 ENCOUNTER — Other Ambulatory Visit: Payer: Self-pay | Admitting: Radiology

## 2017-08-04 ENCOUNTER — Encounter (HOSPITAL_COMMUNITY): Payer: Self-pay

## 2017-08-04 ENCOUNTER — Other Ambulatory Visit: Payer: Self-pay

## 2017-08-04 ENCOUNTER — Encounter (HOSPITAL_COMMUNITY): Payer: Self-pay | Admitting: Neurology

## 2017-08-04 ENCOUNTER — Inpatient Hospital Stay (HOSPITAL_COMMUNITY)
Admission: EM | Admit: 2017-08-04 | Discharge: 2017-08-08 | DRG: 377 | Disposition: A | Discharge status: Home / Self Care | Payer: Medicaid Other | Attending: Internal Medicine | Admitting: Internal Medicine

## 2017-08-04 ENCOUNTER — Ambulatory Visit (HOSPITAL_COMMUNITY)
Admission: RE | Admit: 2017-08-04 | Discharge: 2017-08-04 | Disposition: A | Discharge status: Home / Self Care | Payer: Medicaid Other | Source: Ambulatory Visit | Attending: Gastroenterology | Admitting: Gastroenterology

## 2017-08-04 ENCOUNTER — Telehealth: Payer: Self-pay | Admitting: Gastroenterology

## 2017-08-04 DIAGNOSIS — F1020 Alcohol dependence, uncomplicated: Secondary | ICD-10-CM | POA: Diagnosis present

## 2017-08-04 DIAGNOSIS — I10 Essential (primary) hypertension: Secondary | ICD-10-CM | POA: Diagnosis present

## 2017-08-04 DIAGNOSIS — I129 Hypertensive chronic kidney disease with stage 1 through stage 4 chronic kidney disease, or unspecified chronic kidney disease: Secondary | ICD-10-CM | POA: Diagnosis present

## 2017-08-04 DIAGNOSIS — R7989 Other specified abnormal findings of blood chemistry: Secondary | ICD-10-CM

## 2017-08-04 DIAGNOSIS — K7031 Alcoholic cirrhosis of liver with ascites: Secondary | ICD-10-CM | POA: Insufficient documentation

## 2017-08-04 DIAGNOSIS — D61818 Other pancytopenia: Secondary | ICD-10-CM | POA: Insufficient documentation

## 2017-08-04 DIAGNOSIS — K922 Gastrointestinal hemorrhage, unspecified: Secondary | ICD-10-CM | POA: Diagnosis not present

## 2017-08-04 DIAGNOSIS — C220 Liver cell carcinoma: Secondary | ICD-10-CM | POA: Diagnosis present

## 2017-08-04 DIAGNOSIS — I8500 Esophageal varices without bleeding: Secondary | ICD-10-CM

## 2017-08-04 DIAGNOSIS — D62 Acute posthemorrhagic anemia: Secondary | ICD-10-CM | POA: Diagnosis present

## 2017-08-04 DIAGNOSIS — E871 Hypo-osmolality and hyponatremia: Secondary | ICD-10-CM | POA: Diagnosis present

## 2017-08-04 DIAGNOSIS — D649 Anemia, unspecified: Secondary | ICD-10-CM | POA: Diagnosis present

## 2017-08-04 DIAGNOSIS — Z8249 Family history of ischemic heart disease and other diseases of the circulatory system: Secondary | ICD-10-CM

## 2017-08-04 DIAGNOSIS — R933 Abnormal findings on diagnostic imaging of other parts of digestive tract: Secondary | ICD-10-CM | POA: Diagnosis not present

## 2017-08-04 DIAGNOSIS — B1920 Unspecified viral hepatitis C without hepatic coma: Secondary | ICD-10-CM

## 2017-08-04 DIAGNOSIS — Z79899 Other long term (current) drug therapy: Secondary | ICD-10-CM | POA: Insufficient documentation

## 2017-08-04 DIAGNOSIS — Z515 Encounter for palliative care: Secondary | ICD-10-CM

## 2017-08-04 DIAGNOSIS — Z7951 Long term (current) use of inhaled steroids: Secondary | ICD-10-CM

## 2017-08-04 DIAGNOSIS — D6959 Other secondary thrombocytopenia: Secondary | ICD-10-CM | POA: Diagnosis present

## 2017-08-04 DIAGNOSIS — B1710 Acute hepatitis C without hepatic coma: Secondary | ICD-10-CM

## 2017-08-04 DIAGNOSIS — R160 Hepatomegaly, not elsewhere classified: Secondary | ICD-10-CM | POA: Insufficient documentation

## 2017-08-04 DIAGNOSIS — F17210 Nicotine dependence, cigarettes, uncomplicated: Secondary | ICD-10-CM | POA: Diagnosis present

## 2017-08-04 DIAGNOSIS — E876 Hypokalemia: Secondary | ICD-10-CM | POA: Diagnosis present

## 2017-08-04 DIAGNOSIS — F1490 Cocaine use, unspecified, uncomplicated: Secondary | ICD-10-CM | POA: Diagnosis present

## 2017-08-04 DIAGNOSIS — E8770 Fluid overload, unspecified: Secondary | ICD-10-CM | POA: Diagnosis present

## 2017-08-04 DIAGNOSIS — K7030 Alcoholic cirrhosis of liver without ascites: Secondary | ICD-10-CM

## 2017-08-04 DIAGNOSIS — I864 Gastric varices: Secondary | ICD-10-CM

## 2017-08-04 DIAGNOSIS — N170 Acute kidney failure with tubular necrosis: Secondary | ICD-10-CM | POA: Diagnosis present

## 2017-08-04 DIAGNOSIS — F329 Major depressive disorder, single episode, unspecified: Secondary | ICD-10-CM

## 2017-08-04 DIAGNOSIS — F17200 Nicotine dependence, unspecified, uncomplicated: Secondary | ICD-10-CM | POA: Diagnosis present

## 2017-08-04 DIAGNOSIS — K767 Hepatorenal syndrome: Secondary | ICD-10-CM | POA: Diagnosis present

## 2017-08-04 DIAGNOSIS — N5089 Other specified disorders of the male genital organs: Secondary | ICD-10-CM

## 2017-08-04 DIAGNOSIS — Z79891 Long term (current) use of opiate analgesic: Secondary | ICD-10-CM

## 2017-08-04 DIAGNOSIS — H9190 Unspecified hearing loss, unspecified ear: Secondary | ICD-10-CM | POA: Diagnosis present

## 2017-08-04 DIAGNOSIS — F1910 Other psychoactive substance abuse, uncomplicated: Secondary | ICD-10-CM | POA: Diagnosis present

## 2017-08-04 DIAGNOSIS — N433 Hydrocele, unspecified: Secondary | ICD-10-CM | POA: Diagnosis present

## 2017-08-04 DIAGNOSIS — D696 Thrombocytopenia, unspecified: Secondary | ICD-10-CM | POA: Diagnosis present

## 2017-08-04 DIAGNOSIS — N183 Chronic kidney disease, stage 3 (moderate): Secondary | ICD-10-CM | POA: Diagnosis present

## 2017-08-04 DIAGNOSIS — K7460 Unspecified cirrhosis of liver: Secondary | ICD-10-CM

## 2017-08-04 DIAGNOSIS — K264 Chronic or unspecified duodenal ulcer with hemorrhage: Principal | ICD-10-CM | POA: Diagnosis present

## 2017-08-04 DIAGNOSIS — K7290 Hepatic failure, unspecified without coma: Secondary | ICD-10-CM | POA: Diagnosis present

## 2017-08-04 DIAGNOSIS — K2970 Gastritis, unspecified, without bleeding: Secondary | ICD-10-CM | POA: Diagnosis not present

## 2017-08-04 DIAGNOSIS — R918 Other nonspecific abnormal finding of lung field: Secondary | ICD-10-CM | POA: Diagnosis present

## 2017-08-04 DIAGNOSIS — K429 Umbilical hernia without obstruction or gangrene: Secondary | ICD-10-CM | POA: Diagnosis present

## 2017-08-04 DIAGNOSIS — R34 Anuria and oliguria: Secondary | ICD-10-CM | POA: Diagnosis present

## 2017-08-04 DIAGNOSIS — K7210 Chronic hepatic failure without coma: Secondary | ICD-10-CM | POA: Diagnosis present

## 2017-08-04 DIAGNOSIS — Z7189 Other specified counseling: Secondary | ICD-10-CM

## 2017-08-04 HISTORY — PX: IR PARACENTESIS: IMG2679

## 2017-08-04 LAB — COMPREHENSIVE METABOLIC PANEL
ALBUMIN: 2.3 g/dL — AB (ref 3.5–5.0)
ALT: 111 U/L — AB (ref 17–63)
AST: 106 U/L — AB (ref 15–41)
Alkaline Phosphatase: 144 U/L — ABNORMAL HIGH (ref 38–126)
Anion gap: 9 (ref 5–15)
BUN: 60 mg/dL — AB (ref 6–20)
CHLORIDE: 99 mmol/L — AB (ref 101–111)
CO2: 20 mmol/L — AB (ref 22–32)
CREATININE: 2.18 mg/dL — AB (ref 0.61–1.24)
Calcium: 7.7 mg/dL — ABNORMAL LOW (ref 8.9–10.3)
GFR calc Af Amer: 36 mL/min — ABNORMAL LOW (ref >60)
GFR calc non Af Amer: 31 mL/min — ABNORMAL LOW (ref >60)
Glucose, Bld: 110 mg/dL — ABNORMAL HIGH (ref 65–99)
Potassium: 3.3 mmol/L — ABNORMAL LOW (ref 3.5–5.1)
SODIUM: 128 mmol/L — AB (ref 135–145)
Total Bilirubin: 3.8 mg/dL — ABNORMAL HIGH (ref 0.3–1.2)
Total Protein: 5.7 g/dL — ABNORMAL LOW (ref 6.5–8.1)

## 2017-08-04 LAB — CBC
HEMATOCRIT: 18.5 % — AB (ref 39.0–52.0)
HEMOGLOBIN: 6.3 g/dL — AB (ref 13.0–17.0)
MCH: 30.9 pg (ref 26.0–34.0)
MCHC: 34.1 g/dL (ref 30.0–36.0)
MCV: 90.7 fL (ref 78.0–100.0)
Platelets: 129 10*3/uL — ABNORMAL LOW (ref 150–400)
RBC: 2.04 MIL/uL — AB (ref 4.22–5.81)
RDW: 17.6 % — ABNORMAL HIGH (ref 11.5–15.5)
WBC: 6.1 10*3/uL (ref 4.0–10.5)

## 2017-08-04 LAB — ETHANOL

## 2017-08-04 LAB — RAPID URINE DRUG SCREEN, HOSP PERFORMED
Amphetamines: NOT DETECTED
Barbiturates: NOT DETECTED
Benzodiazepines: NOT DETECTED
Cocaine: POSITIVE — AB
OPIATES: NOT DETECTED
TETRAHYDROCANNABINOL: NOT DETECTED

## 2017-08-04 LAB — PROTIME-INR
INR: 1.83
Prothrombin Time: 21 seconds — ABNORMAL HIGH (ref 11.4–15.2)

## 2017-08-04 LAB — PREPARE RBC (CROSSMATCH)

## 2017-08-04 LAB — POC OCCULT BLOOD, ED: Fecal Occult Bld: POSITIVE — AB

## 2017-08-04 MED ORDER — ONDANSETRON HCL 4 MG/2ML IJ SOLN: 4.0000 mg | Freq: Four times a day (QID) | INTRAMUSCULAR | Status: DC | PRN

## 2017-08-04 MED ORDER — SODIUM CHLORIDE 0.9 % IV SOLN
10.0000 mL/h | Freq: Once | INTRAVENOUS | Status: AC
  Administered 2017-08-04: 10 mL/h via INTRAVENOUS

## 2017-08-04 MED ORDER — SODIUM CHLORIDE 0.9 % IV SOLN
1.0000 g | Freq: Once | INTRAVENOUS | Status: AC
  Administered 2017-08-04: 1 g via INTRAVENOUS
  Filled 2017-08-04: qty 10

## 2017-08-04 MED ORDER — FENTANYL CITRATE (PF) 100 MCG/2ML IJ SOLN
INTRAMUSCULAR | Status: AC
  Filled 2017-08-04: qty 4

## 2017-08-04 MED ORDER — OXYCODONE HCL 5 MG PO TABS
5.0000 mg | ORAL_TABLET | Freq: Two times a day (BID) | ORAL | Status: DC
  Administered 2017-08-04 – 2017-08-08 (×8): 5 mg via ORAL
  Filled 2017-08-04 (×8): qty 1

## 2017-08-04 MED ORDER — MIDAZOLAM HCL 2 MG/2ML IJ SOLN
INTRAMUSCULAR | Status: AC
  Filled 2017-08-04: qty 4

## 2017-08-04 MED ORDER — SODIUM CHLORIDE 0.9 % IV BOLUS
1000.0000 mL | Freq: Once | INTRAVENOUS | Status: AC
  Administered 2017-08-04: 1000 mL via INTRAVENOUS

## 2017-08-04 MED ORDER — RIFAXIMIN 550 MG PO TABS
550.0000 mg | ORAL_TABLET | Freq: Two times a day (BID) | ORAL | Status: DC
  Administered 2017-08-04 – 2017-08-08 (×8): 550 mg via ORAL
  Filled 2017-08-04 (×8): qty 1

## 2017-08-04 MED ORDER — SODIUM CHLORIDE 0.9 % IV SOLN
1.0000 g | INTRAVENOUS | Status: DC
  Administered 2017-08-05 – 2017-08-07 (×3): 1 g via INTRAVENOUS
  Filled 2017-08-04 (×4): qty 10

## 2017-08-04 MED ORDER — ACETAMINOPHEN 650 MG RE SUPP: 650.0000 mg | Freq: Four times a day (QID) | RECTAL | Status: DC | PRN

## 2017-08-04 MED ORDER — LACTATED RINGERS IV SOLN
INTRAVENOUS | Status: DC
  Administered 2017-08-04 – 2017-08-06 (×5): via INTRAVENOUS

## 2017-08-04 MED ORDER — SPIRONOLACTONE 25 MG PO TABS
50.0000 mg | ORAL_TABLET | Freq: Every day | ORAL | Status: DC
  Administered 2017-08-04 – 2017-08-08 (×5): 50 mg via ORAL
  Filled 2017-08-04: qty 2
  Filled 2017-08-04: qty 1
  Filled 2017-08-04 (×4): qty 2

## 2017-08-04 MED ORDER — PANTOPRAZOLE SODIUM 40 MG IV SOLR
40.0000 mg | Freq: Once | INTRAVENOUS | Status: AC
  Administered 2017-08-04: 40 mg via INTRAVENOUS
  Filled 2017-08-04: qty 40

## 2017-08-04 MED ORDER — PANTOPRAZOLE SODIUM 40 MG IV SOLR
40.0000 mg | Freq: Two times a day (BID) | INTRAVENOUS | Status: DC
  Administered 2017-08-04 – 2017-08-06 (×4): 40 mg via INTRAVENOUS
  Filled 2017-08-04 (×4): qty 40

## 2017-08-04 MED ORDER — LIDOCAINE HCL (PF) 2 % IJ SOLN
INTRAMUSCULAR | Status: AC
  Filled 2017-08-04: qty 20

## 2017-08-04 MED ORDER — ONDANSETRON HCL 4 MG PO TABS: 4.0000 mg | ORAL_TABLET | Freq: Four times a day (QID) | ORAL | Status: DC | PRN

## 2017-08-04 MED ORDER — ACETAMINOPHEN 325 MG PO TABS: 650.0000 mg | ORAL_TABLET | Freq: Four times a day (QID) | ORAL | Status: DC | PRN

## 2017-08-04 MED ORDER — FUROSEMIDE 40 MG PO TABS
40.0000 mg | ORAL_TABLET | Freq: Every day | ORAL | Status: DC
  Administered 2017-08-05 – 2017-08-08 (×4): 40 mg via ORAL
  Filled 2017-08-04 (×4): qty 1

## 2017-08-04 NOTE — Progress Notes (Signed)
Patient presented for liver biopsy today, however his labwork revealed a drop in HgB from 9.1 to 6.3 in the past 2 weeks. His INR is elevated today at 1.83.  No procedure planned today.  Dr. Annamaria Boots has discussed with Dr. Loletha Carrow.  Patient being sent to the ED for further eval.   Brynda Greathouse, MS RD PA-C 1:35 PM

## 2017-08-04 NOTE — Progress Notes (Signed)
Jose [name] [id number]  Code Status: FULL  Admission Data: 08/04/2017 1830  Attending Provider: Lorin Mercy [id number], Iona Beard, MD  Consults/ Treatment Team: Treatment Team:  Jackquline Denmark, MD  Jose [name] is a 61 y.o. male patient admitted from ED awake, alert - oriented X 4 - no acute distress noted. VSS - Blood pressure 103/85, pulse 68, temperature 98.2 F (36.8 C), temperature source Axillary, resp. rate 16, height 5\' 6"  (1.676 m), weight 85.1 kg (187 lb 9.8 oz), SpO2 98 %. no c/o shortness of breath, no c/o chest pain. Orientation to room, and floor completed with information packet given to patient/family. Admission INP armband ID verified with patient, and in place.  Fall assessment complete, with patient able to verbalize understanding of risk associated with falls, and verbalized understanding to call nsg before up out of bed. Call light within reach, patient able to voice, and demonstrate understanding. Skin, clean-dry- intact without evidence of bruising, or skin tears.  No evidence of skin break down noted on exam.  ?  Will cont to eval and treat per MD orders.  Melonie Florida, RN  08/04/2017 7:44 PM

## 2017-08-04 NOTE — H&P (View-Only) (Signed)
Consultation  Referring Provider: [facility] Primary Care Physician:  [name], MD Primary Gastroenterologist:  Dr.Danis  Reason for Consultation:  Anemia , GI bleed  HPI: Jose [name] is a 62 y.o. male known to Dr. 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  Patient was scheduled for large-volume paracentesis today as an outpatient and then IR liver biopsy for an enlarging right lobe liver mass concerning for hepatocellular carcinoma. Labs were done prior to the liver biopsy and noted hemoglobin down from his most recent labs 07/16/2017 with hemoglobin 9.1, down to 6 today.  On questioning patient admitted he had been having dark black appearing bowel movements over the past 3 days with 2-3 bowel movements per day.  He also vomited once 2 days ago and brought up black material that he says he was not certain was blood.  He admits to feeling more fatigued over the past couple of days.  He has not had any chest pain shortness of breath or syncope. Patient had undergone upper endoscopy during recent admission on 07/13/2017 per Dr. Loletha Carrow with finding of a gastric erosions, nonbleeding and multiple nonbleeding duodenal erosions/superficial ulcers.  There were no esophageal varices. It not clear that these endoscopic findings explained his profound anemia. Previous colonoscopy had been done in [place] in 2015, with a poor prep, he had some nodular tissue around the appendiceal orifice and one small polyp removed. I reviewed patient's meds today, he brought his bag with him and he does not have any PPI or H2 blocker.  He also does not have any diuretics.  It appears he has been taking Xifaxan 550 twice daily and states that he has lactulose at home that he takes 2 or 3 times daily. Patient currently has no complaints of abdominal pain.  He has developed an increase in ascites and peripheral edema over the past several weeks. Paracentesis was  done today with removal of 5 L. Liver biopsy was not done due to finding of profound anemia.  Patient had MRI done on 07/21/2017 which showed significant enlargement of a heterogeneous infiltrative liver lesion dentalized in the right lobe of the liver favoring progressive hepatocellular carcinoma but not absolutely diagnostic very This liver lesion measures 4 x 5.5 cm.  It was also noted that he had new right lower lobe nodularity of the lung.  Patient has had an elevated AFP, somewhat fluctuating and most recent level was 16.7 last month. This liver lesion had previously been biopsied with finding of atypical hepatocytes.  Hepatocellular carcinoma could not be ruled out but was not definitely diagnostic.    Past Medical History:  Diagnosis Date  . Alcohol dependence ([place])   . Ascites   . Cirrhosis ([place])   . Depression   . Esophageal varices ([place]) 07/2016   per CT  . 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 Hepatitis C    Hepatitis C  . HOH (hard of hearing)   . Hypertension   . Inguinal hernia    right  . QT prolongation   . Shortness of breath    with exertion  . Thrombocytopenia ([place])     Past Surgical History:  Procedure Laterality Date  . CIRCUMCISION    . COLONOSCOPY  march 2015   Dr. Renee Harder: nodular mucosa at appendiceal orifice, tubular adenoma, extremely poor prep  . COLONOSCOPY    . ESOPHAGOGASTRODUODENOSCOPY (EGD) WITH PROPOFOL N/A 07/13/2017   Procedure: ESOPHAGOGASTRODUODENOSCOPY (EGD) WITH PROPOFOL;  Surgeon: Doran Stabler, MD;  Location: [facility];  Service: Gastroenterology;  Laterality: N/A;  . IR PARACENTESIS  08/04/2017  . TOOTH EXTRACTION N/A 05/15/2017   Procedure: DENTAL RESTORATION and multiple teeth EXTRACTIONS;  Surgeon: Diona Browner, DDS;  Location: [facility];  Service: Oral Surgery;  Laterality: N/A;    Prior to Admission medications   Medication Sig Start Date End Date Taking? Authorizing Provider  Cholecalciferol (VITAMIN D-3) 1000 units  CAPS Take 1,000 Units by mouth daily.   Yes [provider]  fluticasone (FLONASE) 50 MCG/ACT nasal spray Place 1 spray into both nostrils daily as needed for allergies.    Yes [provider]  hydroxypropyl methylcellulose / hypromellose (ISOPTO TEARS / GONIOVISC) 2.5 % ophthalmic solution Place 2 drops into both eyes 3 (three) times daily as needed for dry eyes.   Yes [provider]  lactulose (CHRONULAC) 10 GM/15ML solution Take 45 mLs (30 g total) by mouth 4 (four) times daily. Patient taking differently: Take 20 g by mouth 3 (three) times daily.  08/06/16  Yes Reyne Dumas, MD  Multiple Vitamin (TAB-A-VITE) TABS Take 1 tablet by mouth daily.   Yes [provider]  PROAIR HFA 108 (90 Base) MCG/ACT inhaler Inhale 2 puffs into the lungs 4 (four) times daily as needed for shortness of breath or wheezing. 04/02/17  Yes [provider]  rifaximin (XIFAXAN) 550 MG TABS tablet Take 1 tablet (550 mg total) by mouth 2 (two) times daily. 11/14/16  Yes Danis, Kirke Corin, MD  triamterene-hydrochlorothiazide (MAXZIDE-25) 37.5-25 MG tablet Take 1 tablet by mouth daily.   Yes [provider]    Current Facility-Administered Medications  Medication Dose Route Frequency Provider Last Rate Last Dose  . acetaminophen (TYLENOL) tablet 650 mg  650 mg Oral Q6H PRN [name], MD       Or  . acetaminophen (TYLENOL) suppository 650 mg  650 mg Rectal Q6H PRN [name], MD      . Derrill Memo ON 08/05/2017] cefTRIAXone (ROCEPHIN) 1 g in sodium chloride 0.9 % 100 mL IVPB  1 g Intravenous Q24H [name], MD      . lactated ringers infusion   Intravenous Continuous [name], MD 75 mL/hr at 08/04/17 1636    . ondansetron (ZOFRAN) tablet 4 mg  4 mg Oral Q6H PRN [name], MD       Or  . ondansetron [facility]) injection 4 mg  4 mg Intravenous Q6H PRN [name], MD      . OxyCODONE HCl ([facility]eter) (OXAYDO) TABA 1 tablet  1 tablet Oral BID  [name], MD      . pantoprazole (PROTONIX) injection 40 mg  40 mg Intravenous Lillia Mountain, MD      . rifaximin Doreene Nest) tablet 550 mg  550 mg Oral BID [name], MD       Current Outpatient Medications  Medication Sig Dispense Refill  . Cholecalciferol (VITAMIN D-3) 1000 units CAPS Take 1,000 Units by mouth daily.    . fluticasone (FLONASE) 50 MCG/ACT nasal spray Place 1 spray into both nostrils daily as needed for allergies.     . hydroxypropyl methylcellulose / hypromellose (ISOPTO TEARS / GONIOVISC) 2.5 % ophthalmic solution Place 2 drops into both eyes 3 (three) times daily as needed for dry eyes.    Marland Kitchen lactulose (CHRONULAC) 10 GM/15ML solution Take 45 mLs (30 g total) by mouth 4 (four) times daily. (Patient taking differently: Take 20 g by mouth 3 (three)  times daily. ) 500 mL 2  . Multiple Vitamin (TAB-A-VITE) TABS Take 1 tablet by mouth daily.    Marland Kitchen PROAIR HFA 108 (90 Base) MCG/ACT inhaler Inhale 2 puffs into the lungs 4 (four) times daily as needed for shortness of breath or wheezing.  3  . rifaximin (XIFAXAN) 550 MG TABS tablet Take 1 tablet (550 mg total) by mouth 2 (two) times daily. 60 tablet 6  . triamterene-hydrochlorothiazide (MAXZIDE-25) 37.5-25 MG tablet Take 1 tablet by mouth daily.     Facility-Administered Medications Ordered in Other Encounters  Medication Dose Route Frequency Provider Last Rate Last Dose  . lidocaine (XYLOCAINE) 2 % injection             Allergies as of 08/04/2017  . (No Known Allergies)    Family History  Problem Relation Age of Onset  . Breast cancer Other   . Diabetes Other   . Hypertension Other   . Breast cancer Mother   . Hypertension Mother   . Diabetes Father   . Hypertension Sister   . Heart disease Sister   . Hypertension Paternal Grandmother   . Colon cancer Neg Hx     Social History   Socioeconomic History  . Marital status: Single    Spouse name: Not on file  . Number of children: Not on file  .  Years of education: Not on file  . Highest education level: Not on file  Occupational History  . Not on file  Social Needs  . Financial resource strain: Not on file  . Food insecurity:    Worry: Not on file    Inability: Not on file  . Transportation needs:    Medical: Not on file    Non-medical: Not on file  Tobacco Use  . Smoking status: Current Some Day Smoker    Packs/day: 0.12    Years: 25.00    Pack years: 3.00    Types: Cigarettes  . Smokeless tobacco: Never Used  Substance and Sexual Activity  . Alcohol use: Yes    Comment: denies any in the last few weeks  . Drug use: No    Types: Marijuana, Cocaine    Comment: Last time for cocaine a few weeks ago ;Marijuana- intermittent  . Sexual activity: Not Currently    Comment: MPOA sister and Doug senior  Lifestyle  . Physical activity:    Days per week: Not on file    Minutes per session: Not on file  . Stress: Not on file  Relationships  . Social connections:    Talks on phone: Not on file    Gets together: Not on file    Attends religious service: Not on file    Active member of club or organization: Not on file    Attends meetings of clubs or organizations: Not on file    Relationship status: Not on file  . Intimate partner violence:    Fear of current or ex partner: Not on file    Emotionally abused: Not on file    Physically abused: Not on file    Forced sexual activity: Not on file  Other Topics Concern  . Not on file  Social History Narrative   ** Merged History Encounter **        Review of Systems: Pertinent positive and negative review of systems were noted in the above HPI section.  All other review of systems was otherwise negative.Marland Kitchen  Physical Exam: Vital signs in last 24 hours: Temp:  [  97.4 F (36.3 C)-98.4 F (36.9 C)] 97.8 F (36.6 C) (04/09 1605) Pulse Rate:  [52-80] 59 (04/09 1700) Resp:  [16-19] 16 (04/09 1605) BP: (112-124)/(52-79) 112/52 (04/09 1700) SpO2:  [94 %-100 %] 99 % (04/09  1700) Weight:  [201 lb (91.2 kg)] 201 lb (91.2 kg) (04/09 1137)   General:   Alert,  Well-developed, well-nourished, chronically ill appearing,pleasant and cooperative in NAD Head:  Normocephalic and atraumatic. Eyes:  Sclera clear, no icterus.   Conjunctiva pale . Ears:  Normal auditory acuity. Nose:  No deformity, discharge,  or lesions. Mouth:  No deformity or lesions.   Neck:  Supple; no masses or thyromegaly. Lungs:  Clear throughout to auscultation.   No wheezes, crackles, or rhonchi. Heart:  Regular rate and rhythm; no murmurs, clicks, rubs,  or gallops. Abdomen:  Soft,very protuberant,nontender, non tense ascites, BS active,nonpalp mass or hsm.   Rectal:  Deferred , documented melena Msk:  Symmetrical without gross deformities. . Pulses:  Normal pulses noted. Extremities:  2+ edema to above knees bilaterally Neurologic:  Alert and  oriented x4;  grossly normal neurologically. Skin:  Intact without significant lesions or rashes.. Psych:  Alert and cooperative. Normal mood and affect, no asterixis  Intake/Output from previous day: No intake/output data recorded. Intake/Output this shift: No intake/output data recorded.  Lab Results: Recent Labs    08/04/17 1057  WBC 6.1  HGB 6.3*  HCT 18.5*  PLT 129*   BMET No results for input(s): NA, K, CL, CO2, GLUCOSE, BUN, CREATININE, CALCIUM in the last 72 hours. LFT No results for input(s): PROT, ALBUMIN, AST, ALT, ALKPHOS, BILITOT, BILIDIR, IBILI in the last 72 hours. PT/INR Recent Labs    08/04/17 1057  LABPROT 21.0*  INR 1.83   Hepatitis Panel No results for input(s): HEPBSAG, HCVAB, HEPAIGM, HEPBIGM in the last 72 hours.    IMPRESSION:  #64  62 year old African-American male with decompensated cirrhosis secondary to hepatitis C and alcohol being admitted today with 3 g drop in hemoglobin and complaints of melena times 3 days and one episode of coffee-ground emesis 2 days ago at home. Patient had EGD about 3 weeks  ago while inpatient with findings of a few nonbleeding gastric erosions and multiple superficial duodenal erosions/ulcers nonbleeding.  Fortunately no varices. Question if he is developing portal gastropathy/gave. He has not been on PPI therapy  #2 enlarging infiltrative lesion central right lobe of the liver, 4 x 5.5 cm on MRI done 2 weeks ago and consistent with a progressive HCC though not definitely diagnostic. He also has new right lower lobe lung nodularity which will need further evaluation with CT Patient was set up for outpatient IR/liver biopsy today which was canceled after finding of profound anemia Prior biopsy of this lesion in 2017 concerning for [facility] but nondiagnostic  #3 anasarca-patient is stable status post 5 L paracentesis today.  He still has a significant amount of ascites.  He also has marked peripheral edema.  He has not been on diuretic therapy  #4 history of hepatic encephalopathy-stable no overt encephalopathy today #5 alcoholism-no EtOH times 1 month #6 history of hypertension #7 acute on chronic anemia  Plan; IV Protonix twice daily Patient is being transfused 2 units this evening, would keep hemoglobin in the 7-8 range  We have scheduled patient for EGD with Dr. Lyndel Safe tomorrow Procedure was discussed in detail with the patient including indications risks and benefits and he is agreeable to proceed.  Patient will likely need repeat paracentesis during this  admission. Also will need to start diuretics, Lasix and Aldactone  Will further discuss liver biopsy with team.  Once he is stabilized and GI bleeding has resolved perhaps this can be done during this admission. Also may be appropriate to consult hematology, to determine if liver biopsy must be done prior to consideration of treatment for what appears to be an enlarging HCC. He will also need CT of his chest to further evaluate the new right lower lobe nodularity-if this appears to be consistent with metastatic  disease then liver biopsy would not be needed.      Amy Esterwood  08/04/2017, 5:09 PM   Attending physician's note   I have taken history, reviewed the chart and examined the patient. I agree with the Advanced Practitioner's note, impression and recommendations.   62 year old with decompensated liver cirrhosis due to hepatitis C and alcohol with upper GI bleeding, status post therapeutic paracentesis (5 lit), history of hepatic encephalopathy, coagulopathy, enlarging right lobe liver lesion highly suggestive of hepatocellular carcinoma (was scheduled for liver biopsy, normal AFP).  Not a candidate for liver transplantation due to history of alcohol abuse (last alcohol use a month ago).  No varices on EGD 07/13/2017.  Had gastric and duodenal erosions/superficial ulcers.  Hemoglobin down to 6 (baseline 9.1  06/2017), status post 2 units of PRBC.  Platelets 129K, INR 1.8. Plan: Repeat EGD tomorrow, IV Protonix, IV antibiotics, would hold off on IV octreotide due to cost and absence of varices on previous EGD.  Liver biopsy once stable.  Will need repeat paracenteses.  Fluid and salt restricted diet.  Diuretics.  Pt with poor prognosis and high mortality.  Carmell Austria, MD

## 2017-08-04 NOTE — Progress Notes (Signed)
Procedure cancelled by Dr. Annamaria Boots due to lab results. Pt transferred to ED. Awake and alert. In no distress. Sister at bedside

## 2017-08-04 NOTE — ED Notes (Signed)
Called MD to clarify how any units of blood pt is to receive. Pt is only to receive total of 2 units of blood.

## 2017-08-04 NOTE — Telephone Encounter (Signed)
Thanks Liam Rogers him late today. Will take care of him. Will do EGD tomorrow.  Then, repeat paracentesis and liver Bx.

## 2017-08-04 NOTE — Procedures (Signed)
PROCEDURE SUMMARY:  Successful US guided paracentesis from right lateral abdomen.  Yielded 5.0 liters of clear, yellow fluid.  No immediate complications.  Pt tolerated well.   Specimen was not sent for labs.  Docia Barrier PA-C 08/04/2017 1:33 PM

## 2017-08-04 NOTE — Telephone Encounter (Signed)
I rec'd a call from Dr. Reesa Chew in interventional radiology who performed an outpatient paracentesis today in anticipation of biopsy of the liver mass.  Labs drawn today show Hgb 6.3, down from 9 recently, with INR up to 1.8.  Patient reported to them having vomited coffee ground material 2 days ago.  I asked them to send the patient to the ED, and I spoke with one of the attending physicians in the [facility] ED.I expect the patient will be admitted to the medicine service, and I will alert my colleague Dr. Carmell Austria so he can see the patient in consultation.

## 2017-08-04 NOTE — ED Notes (Signed)
Lab work will be drawn after blood transfusion complete

## 2017-08-04 NOTE — ED Triage Notes (Addendum)
Pt is coming from IR where he was scheduled to hav paracentesis and liver biopsy for liver mass. Had the paracentesis, but HGB was found to have dropped to 6, sent here for evaluation. Had blood transfusion recently here. Thinks he might have seen some dark blood in his emesis over the weekend?

## 2017-08-04 NOTE — Consult Note (Addendum)
Consultation  Referring Provider: [facility] Primary Care Physician:  [name], MD Primary Gastroenterologist:  Dr.Danis  Reason for Consultation:  Anemia , GI bleed  HPI: Jose [name] is a 62 y.o. male known to Dr. 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  Patient was scheduled for large-volume paracentesis today as an outpatient and then IR liver biopsy for an enlarging right lobe liver mass concerning for hepatocellular carcinoma. Labs were done prior to the liver biopsy and noted hemoglobin down from his most recent labs 07/16/2017 with hemoglobin 9.1, down to 6 today.  On questioning patient admitted he had been having dark black appearing bowel movements over the past 3 days with 2-3 bowel movements per day.  He also vomited once 2 days ago and brought up black material that he says he was not certain was blood.  He admits to feeling more fatigued over the past couple of days.  He has not had any chest pain shortness of breath or syncope. Patient had undergone upper endoscopy during recent admission on 07/13/2017 per Dr. Loletha Carrow with finding of a gastric erosions, nonbleeding and multiple nonbleeding duodenal erosions/superficial ulcers.  There were no esophageal varices. It not clear that these endoscopic findings explained his profound anemia. Previous colonoscopy had been done in [place] in 2015, with a poor prep, he had some nodular tissue around the appendiceal orifice and one small polyp removed. I reviewed patient's meds today, he brought his bag with him and he does not have any PPI or H2 blocker.  He also does not have any diuretics.  It appears he has been taking Xifaxan 550 twice daily and states that he has lactulose at home that he takes 2 or 3 times daily. Patient currently has no complaints of abdominal pain.  He has developed an increase in ascites and peripheral edema over the past several weeks. Paracentesis was  done today with removal of 5 L. Liver biopsy was not done due to finding of profound anemia.  Patient had MRI done on 07/21/2017 which showed significant enlargement of a heterogeneous infiltrative liver lesion dentalized in the right lobe of the liver favoring progressive hepatocellular carcinoma but not absolutely diagnostic very This liver lesion measures 4 x 5.5 cm.  It was also noted that he had new right lower lobe nodularity of the lung.  Patient has had an elevated AFP, somewhat fluctuating and most recent level was 16.7 last month. This liver lesion had previously been biopsied with finding of atypical hepatocytes.  Hepatocellular carcinoma could not be ruled out but was not definitely diagnostic.    Past Medical History:  Diagnosis Date  . Alcohol dependence ([place])   . Ascites   . Cirrhosis ([place])   . Depression   . Esophageal varices ([place]) 07/2016   per CT  . 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 Hepatitis C    Hepatitis C  . HOH (hard of hearing)   . Hypertension   . Inguinal hernia    right  . QT prolongation   . Shortness of breath    with exertion  . Thrombocytopenia ([place])     Past Surgical History:  Procedure Laterality Date  . CIRCUMCISION    . COLONOSCOPY  march 2015   Dr. Renee Harder: nodular mucosa at appendiceal orifice, tubular adenoma, extremely poor prep  . COLONOSCOPY    . ESOPHAGOGASTRODUODENOSCOPY (EGD) WITH PROPOFOL N/A 07/13/2017   Procedure: ESOPHAGOGASTRODUODENOSCOPY (EGD) WITH PROPOFOL;  Surgeon: Doran Stabler, MD;  Location: [facility];  Service: Gastroenterology;  Laterality: N/A;  . IR PARACENTESIS  08/04/2017  . TOOTH EXTRACTION N/A 05/15/2017   Procedure: DENTAL RESTORATION and multiple teeth EXTRACTIONS;  Surgeon: Diona Browner, DDS;  Location: [facility];  Service: Oral Surgery;  Laterality: N/A;    Prior to Admission medications   Medication Sig Start Date End Date Taking? Authorizing Provider  Cholecalciferol (VITAMIN D-3) 1000 units  CAPS Take 1,000 Units by mouth daily.   Yes [provider]  fluticasone (FLONASE) 50 MCG/ACT nasal spray Place 1 spray into both nostrils daily as needed for allergies.    Yes [provider]  hydroxypropyl methylcellulose / hypromellose (ISOPTO TEARS / GONIOVISC) 2.5 % ophthalmic solution Place 2 drops into both eyes 3 (three) times daily as needed for dry eyes.   Yes [provider]  lactulose (CHRONULAC) 10 GM/15ML solution Take 45 mLs (30 g total) by mouth 4 (four) times daily. Patient taking differently: Take 20 g by mouth 3 (three) times daily.  08/06/16  Yes Reyne Dumas, MD  Multiple Vitamin (TAB-A-VITE) TABS Take 1 tablet by mouth daily.   Yes [provider]  PROAIR HFA 108 (90 Base) MCG/ACT inhaler Inhale 2 puffs into the lungs 4 (four) times daily as needed for shortness of breath or wheezing. 04/02/17  Yes [provider]  rifaximin (XIFAXAN) 550 MG TABS tablet Take 1 tablet (550 mg total) by mouth 2 (two) times daily. 11/14/16  Yes Danis, Kirke Corin, MD  triamterene-hydrochlorothiazide (MAXZIDE-25) 37.5-25 MG tablet Take 1 tablet by mouth daily.   Yes [provider]    Current Facility-Administered Medications  Medication Dose Route Frequency Provider Last Rate Last Dose  . acetaminophen (TYLENOL) tablet 650 mg  650 mg Oral Q6H PRN [name], MD       Or  . acetaminophen (TYLENOL) suppository 650 mg  650 mg Rectal Q6H PRN [name], MD      . Derrill Memo ON 08/05/2017] cefTRIAXone (ROCEPHIN) 1 g in sodium chloride 0.9 % 100 mL IVPB  1 g Intravenous Q24H [name], MD      . lactated ringers infusion   Intravenous Continuous [name], MD 75 mL/hr at 08/04/17 1636    . ondansetron (ZOFRAN) tablet 4 mg  4 mg Oral Q6H PRN [name], MD       Or  . ondansetron [facility]) injection 4 mg  4 mg Intravenous Q6H PRN [name], MD      . OxyCODONE HCl ([facility]) (OXAYDO) TABA 1 tablet  1 tablet Oral BID  [name], MD      . pantoprazole (PROTONIX) injection 40 mg  40 mg Intravenous Lillia Mountain, MD      . rifaximin Doreene Nest) tablet 550 mg  550 mg Oral BID [name], MD       Current Outpatient Medications  Medication Sig Dispense Refill  . Cholecalciferol (VITAMIN D-3) 1000 units CAPS Take 1,000 Units by mouth daily.    . fluticasone (FLONASE) 50 MCG/ACT nasal spray Place 1 spray into both nostrils daily as needed for allergies.     . hydroxypropyl methylcellulose / hypromellose (ISOPTO TEARS / GONIOVISC) 2.5 % ophthalmic solution Place 2 drops into both eyes 3 (three) times daily as needed for dry eyes.    Marland Kitchen lactulose (CHRONULAC) 10 GM/15ML solution Take 45 mLs (30 g total) by mouth 4 (four) times daily. (Patient taking differently: Take 20 g by mouth 3 (three)  times daily. ) 500 mL 2  . Multiple Vitamin (TAB-A-VITE) TABS Take 1 tablet by mouth daily.    Marland Kitchen PROAIR HFA 108 (90 Base) MCG/ACT inhaler Inhale 2 puffs into the lungs 4 (four) times daily as needed for shortness of breath or wheezing.  3  . rifaximin (XIFAXAN) 550 MG TABS tablet Take 1 tablet (550 mg total) by mouth 2 (two) times daily. 60 tablet 6  . triamterene-hydrochlorothiazide (MAXZIDE-25) 37.5-25 MG tablet Take 1 tablet by mouth daily.     Facility-Administered Medications Ordered in Other Encounters  Medication Dose Route Frequency Provider Last Rate Last Dose  . lidocaine (XYLOCAINE) 2 % injection             Allergies as of 08/04/2017  . (No Known Allergies)    Family History  Problem Relation Age of Onset  . Breast cancer Other   . Diabetes Other   . Hypertension Other   . Breast cancer Mother   . Hypertension Mother   . Diabetes Father   . Hypertension Sister   . Heart disease Sister   . Hypertension Paternal Grandmother   . Colon cancer Neg Hx     Social History   Socioeconomic History  . Marital status: Single    Spouse name: Not on file  . Number of children: Not on file  .  Years of education: Not on file  . Highest education level: Not on file  Occupational History  . Not on file  Social Needs  . Financial resource strain: Not on file  . Food insecurity:    Worry: Not on file    Inability: Not on file  . Transportation needs:    Medical: Not on file    Non-medical: Not on file  Tobacco Use  . Smoking status: Current Some Day Smoker    Packs/day: 0.12    Years: 25.00    Pack years: 3.00    Types: Cigarettes  . Smokeless tobacco: Never Used  Substance and Sexual Activity  . Alcohol use: Yes    Comment: denies any in the last few weeks  . Drug use: No    Types: Marijuana, Cocaine    Comment: Last time for cocaine a few weeks ago ;Marijuana- intermittent  . Sexual activity: Not Currently    Comment: MPO[employer] sister and Doug senior  Lifestyle  . Physical activity:    Days per week: Not on file    Minutes per session: Not on file  . Stress: Not on file  Relationships  . Social connections:    Talks on phone: Not on file    Gets together: Not on file    Attends religious service: Not on file    Active member of club or organization: Not on file    Attends meetings of clubs or organizations: Not on file    Relationship status: Not on file  . Intimate partner violence:    Fear of current or ex partner: Not on file    Emotionally abused: Not on file    Physically abused: Not on file    Forced sexual activity: Not on file  Other Topics Concern  . Not on file  Social History Narrative   ** Merged History Encounter **        Review of Systems: Pertinent positive and negative review of systems were noted in the above HPI section.  All other review of systems was otherwise negative.Marland Kitchen  Physical Exam: Vital signs in last 24 hours: Temp:  [  97.4 F (36.3 C)-98.4 F (36.9 C)] 97.8 F (36.6 C) (04/09 1605) Pulse Rate:  [52-80] 59 (04/09 1700) Resp:  [16-19] 16 (04/09 1605) BP: (112-124)/(52-79) 112/52 (04/09 1700) SpO2:  [94 %-100 %] 99 % (04/09  1700) Weight:  [201 lb (91.2 kg)] 201 lb (91.2 kg) (04/09 1137)   General:   Alert,  Well-developed, well-nourished, chronically ill appearing,pleasant and cooperative in NAD Head:  Normocephalic and atraumatic. Eyes:  Sclera clear, no icterus.   Conjunctiva pale . Ears:  Normal auditory acuity. Nose:  No deformity, discharge,  or lesions. Mouth:  No deformity or lesions.   Neck:  Supple; no masses or thyromegaly. Lungs:  Clear throughout to auscultation.   No wheezes, crackles, or rhonchi. Heart:  Regular rate and rhythm; no murmurs, clicks, rubs,  or gallops. Abdomen:  Soft,very protuberant,nontender, non tense ascites, BS active,nonpalp mass or hsm.   Rectal:  Deferred , documented melena Msk:  Symmetrical without gross deformities. . Pulses:  Normal pulses noted. Extremities:  2+ edema to above knees bilaterally Neurologic:  Alert and  oriented x4;  grossly normal neurologically. Skin:  Intact without significant lesions or rashes.. Psych:  Alert and cooperative. Normal mood and affect, no asterixis  Intake/Output from previous day: No intake/output data recorded. Intake/Output this shift: No intake/output data recorded.  Lab Results: Recent Labs    08/04/17 1057  WBC 6.1  HGB 6.3*  HCT 18.5*  PLT 129*   BMET No results for input(s): NA, K, CL, CO2, GLUCOSE, BUN, CREATININE, CALCIUM in the last 72 hours. LFT No results for input(s): PROT, ALBUMIN, AST, ALT, ALKPHOS, BILITOT, BILIDIR, IBILI in the last 72 hours. PT/INR Recent Labs    08/04/17 1057  LABPROT 21.0*  INR 1.83   Hepatitis Panel No results for input(s): HEPBSAG, HCVAB, HEPAIGM, HEPBIGM in the last 72 hours.    IMPRESSION:  #102  62 year old African-American male with decompensated cirrhosis secondary to hepatitis C and alcohol being admitted today with 3 g drop in hemoglobin and complaints of melena times 3 days and one episode of coffee-ground emesis 2 days ago at home. Patient had EGD about 3 weeks  ago while inpatient with findings of a few nonbleeding gastric erosions and multiple superficial duodenal erosions/ulcers nonbleeding.  Fortunately no varices. Question if he is developing portal gastropathy/gave. He has not been on PPI therapy  #2 enlarging infiltrative lesion central right lobe of the liver, 4 x 5.5 cm on MRI done 2 weeks ago and consistent with a progressive HCC though not definitely diagnostic. He also has new right lower lobe lung nodularity which will need further evaluation with CT Patient was set up for outpatient IR/liver biopsy today which was canceled after finding of profound anemia Prior biopsy of this lesion in 2017 concerning for [facility] but nondiagnostic  #3 anasarca-patient is stable status post 5 L paracentesis today.  He still has a significant amount of ascites.  He also has marked peripheral edema.  He has not been on diuretic therapy  #4 history of hepatic encephalopathy-stable no overt encephalopathy today #5 alcoholism-no EtOH times 1 month #6 history of hypertension #7 acute on chronic anemia  Plan; IV Protonix twice daily Patient is being transfused 2 units this evening, would keep hemoglobin in the 7-8 range  We have scheduled patient for EGD with Dr. Lyndel Safe tomorrow Procedure was discussed in detail with the patient including indications risks and benefits and he is agreeable to proceed.  Patient will likely need repeat paracentesis during this  admission. Also will need to start diuretics, Lasix and Aldactone  Will further discuss liver biopsy with team.  Once he is stabilized and GI bleeding has resolved perhaps this can be done during this admission. Also may be appropriate to consult hematology, to determine if liver biopsy must be done prior to consideration of treatment for what appears to be an enlarging HCC. He will also need CT of his chest to further evaluate the new right lower lobe nodularity-if this appears to be consistent with metastatic  disease then liver biopsy would not be needed.      Amy Esterwood  08/04/2017, 5:09 PM   Attending physician's note   I have taken history, reviewed the chart and examined the patient. I agree with the Advanced Practitioner's note, impression and recommendations.   62 year old with decompensated liver cirrhosis due to hepatitis C and alcohol with upper GI bleeding, status post therapeutic paracentesis (5 lit), history of hepatic encephalopathy, coagulopathy, enlarging right lobe liver lesion highly suggestive of hepatocellular carcinoma (was scheduled for liver biopsy, normal AFP).  Not a candidate for liver transplantation due to history of alcohol abuse (last alcohol use a month ago).  No varices on EGD 07/13/2017.  Had gastric and duodenal erosions/superficial ulcers.  Hemoglobin down to 6 (baseline 9.1  06/2017), status post 2 units of PRBC.  Platelets 129K, INR 1.8. Plan: Repeat EGD tomorrow, IV Protonix, IV antibiotics, would hold off on IV octreotide due to cost and absence of varices on previous EGD.  Liver biopsy once stable.  Will need repeat paracenteses.  Fluid and salt restricted diet.  Diuretics.  Pt with poor prognosis and high mortality.  Carmell Austria, MD

## 2017-08-04 NOTE — H&P (Signed)
History and Physical    Jose [name] MWN:[id number] DOB: 1955/11/02 DOA: 08/04/2017  PCP: [name], MD Consultants:  Alvy Bimler - oncology; Danis - GI Patient coming from:  Home - lives alone; NOK: Sister  Chief Complaint:  Abnormal lab  HPI: Jose [name] is a 62 y.o. male with medical history significant of liver failure with ascites and esophageal/gastric varices; ETOH dependence; polysubstance abuse; thrombocytopenia; and liver mass presenting due to incidental finding of anemia.  Abdomijnal distention due to ascites.  He was vomiting brown emesis the other night, did not see bleeding.  He did have leakage from has navel and has a hernia.  Last ETOH was 2-3 weeks ago.  Sometimes he feels light-headed or dizzy, last time was a couple of weeks ago  Some occasional SOB.  Stools have been brown to dark brown, ocacsionally black.   ED Course:  ?UGI bleed.  Hgb 6.3 in IR for paracentesis.  [facility] recommended admission.  Soft, brown stool.  No abdominal pain.  Given Rocephin, Protonix.  Review of Systems: As per HPI; otherwise review of systems reviewed and negative.   Ambulatory Status:  Ambulates without assistance, but using a cane lately  Past Medical History:  Diagnosis Date  . Alcohol dependence ([place])   . Ascites   . Cirrhosis ([place])   . Depression   . Esophageal varices ([place]) 07/2016   per CT  . 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 Hepatitis C    Hepatitis C  . HOH (hard of hearing)   . Hypertension   . Inguinal hernia    right  . QT prolongation   . Shortness of breath    with exertion  . Thrombocytopenia ([place])     Past Surgical History:  Procedure Laterality Date  . CIRCUMCISION    . COLONOSCOPY  march 2015   Dr. Renee Harder: nodular mucosa at appendiceal orifice, tubular adenoma, extremely poor prep  . COLONOSCOPY    . ESOPHAGOGASTRODUODENOSCOPY (EGD) WITH PROPOFOL N/A 07/13/2017   Procedure: ESOPHAGOGASTRODUODENOSCOPY (EGD) WITH PROPOFOL;  Surgeon: Doran Stabler, MD;  [facility];  Service: Gastroenterology;  Laterality: N/A;  . IR PARACENTESIS  08/04/2017  . TOOTH EXTRACTION N/A 05/15/2017   Procedure: DENTAL RESTORATION and multiple teeth EXTRACTIONS;  Surgeon: Diona Browner, DDS;  Location: [facility];  Service: Oral Surgery;  Laterality: N/A;    Social History   Socioeconomic History  . Marital status: Single    Spouse name: Not on file  . Number of children: Not on file  . Years of education: Not on file  . Highest education level: Not on file  Occupational History  . Not on file  Social Needs  . Financial resource strain: Not on file  . Food insecurity:    Worry: Not on file    Inability: Not on file  . Transportation needs:    Medical: Not on file    Non-medical: Not on file  Tobacco Use  . Smoking status: Current Some Day Smoker    Packs/day: 0.12    Years: 25.00    Pack years: 3.00    Types: Cigarettes  . Smokeless tobacco: Never Used  Substance and Sexual Activity  . Alcohol use: Yes    Comment: denies any in the last few weeks  . Drug use: No    Types: Marijuana, Cocaine    Comment: Last time for cocaine a few weeks ago ;Marijuana- intermittent  . 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 Physical activity:    Days per week: Not on file    Minutes per session: Not on file  . Stress: Not on file  Relationships  . Social connections:    Talks on phone: Not on file    Gets together: Not on file    Attends religious service: Not on file    Active member of club or organization: Not on file    Attends meetings of clubs or organizations: Not on file    Relationship status: Not on file  . Intimate partner violence:    Fear of current or ex partner: Not on file    Emotionally abused: Not on file    Physically abused: Not on file    Forced sexual activity: Not on file  Other Topics Concern  . Not on file  Social History Narrative   ** Merged History  Encounter **        No Known Allergies  Family History  Problem Relation Age of Onset  . Breast cancer Other   . Diabetes Other   . Hypertension Other   . Breast cancer Mother   . Hypertension Mother   . Diabetes Father   . Hypertension Sister   . Heart disease Sister   . Hypertension Paternal Grandmother   . Colon cancer Neg Hx     Prior to Admission medications   Medication Sig Start Date End Date Taking? Authorizing Provider  cholecalciferol (VITAMIN D) 1000 units tablet Take 1 capsule by mouth daily.    [provider]  fluticasone (FLONASE) 50 MCG/ACT nasal spray Place 1 spray into both nostrils daily as needed for allergies.     [provider]  furosemide (LASIX) 40 MG tablet Take 1 tablet (40 mg total) by mouth daily. Patient not taking: Reported on 05/11/2017 11/14/16   Doran Stabler, MD  lactulose (CHRONULAC) 10 GM/15ML solution Take 45 mLs (30 g total) by mouth 4 (four) times daily. Patient not taking: Reported on 07/31/2017 08/06/16   Reyne Dumas, MD  omeprazole (PRILOSEC) 20 MG capsule Take 1 capsule (20 mg total) by mouth at bedtime. Patient not taking: Reported on 07/31/2017 12/22/16   Thayer Headings, MD  OxyCODONE HCl, Abuse Deter, (OXAYDO) 5 MG TABA Take 1 tablet by mouth 2 (two) times daily.    [provider]  oxyCODONE-acetaminophen (PERCOCET) 5-325 MG tablet Take 1 tablet by mouth every 4 (four) hours as needed for severe pain. Patient not taking: Reported on 07/12/2017 05/15/17   Diona Browner, DDS  PROAIR HFA 108 [phone] Base) MCG/ACT inhaler Inhale 2 puffs into the lungs 4 (four) times daily as needed for shortness of breath or wheezing. 04/02/17   [provider]  rifaximin (XIFAXAN) 550 MG TABS tablet Take 1 tablet (550 mg total) by mouth 2 (two) times daily. 11/14/16   Doran Stabler, MD  spironolactone (ALDACTONE) 100 MG tablet TAKE 1 TABLET (100 MG TOTAL) BY MOUTH DAILY. Patient not taking: Reported on 05/11/2017 03/03/17    Doran Stabler, MD  triamterene-hydrochlorothiazide (MAXZIDE-25) 37.5-25 MG tablet Take 1 tablet by mouth daily.    [provider]  VITAMIN A PO Take 1 tablet by mouth daily.    [provider]    Physical Exam: Vitals:   08/04/17 1530 08/04/17 1545 08/04/17 1547 08/04/17 1605  BP: 113/71 119/69 119/69 118/62  Pulse: (!) 55 (!) 58 (!) 56 (!) 55  Resp:   19 16  Temp:   (!) 97.4 F (36.3 C) 97.8 F (36.6 C)  TempSrc:   Oral Oral  SpO2: 100% 100%  100%     General:  Appears calm and comfortable and is NAD Eyes:  PERRL, EOMI, normal lids, iris ENT:  grossly normal hearing, lips & tongue, mmm; poor dentition Neck:  no LAD, masses or thyromegaly Cardiovascular:  RRR, 1-7/5 systolic murmur, no r/g. 2+ R>L LE edema, chronic.  Respiratory:   CTA bilaterally with no wheezes/rales/rhonchi.  Normal respiratory effort. Abdomen:  soft, NT, ND, NABS; small umbilical hernia noted Back:   normal alignment, no CVAT Skin:  no rash or induration seen on limited exam Musculoskeletal:  grossly normal tone BUE/BLE, good ROM, no bony abnormality Psychiatric:  grossly normal mood and affect, speech fluent and appropriate, AOx3 Neurologic:  CN 2-12 grossly intact, moves all extremities in coordinated fashion, sensation intact    Radiological Exams on Admission: Ir Paracentesis  Result Date: 08/04/2017 INDICATION: Patient with alcoholic cirrhosis, hep C, enlarging liver lesion, ascites. Request is made for therapeutic paracentesis. EXAM: ULTRASOUND GUIDED THERAPEUTIC PARACENTESIS MEDICATIONS: 10 mL 2% lidocaine COMPLICATIONS: None immediate. PROCEDURE: Informed written consent was obtained from the patient after a discussion of the risks, benefits and alternatives to treatment. A timeout was performed prior to the initiation of the procedure. Initial ultrasound scanning demonstrates a large amount of ascites within the right lower abdominal quadrant. The right lower abdomen was  prepped and draped in the usual sterile fashion. 2% lidocaine was used for local anesthesia. Following this, a 19 gauge, 7-cm, Yueh catheter was introduced. An ultrasound image was saved for documentation purposes. The paracentesis was performed. The catheter was removed and a dressing was applied. The patient tolerated the procedure well without immediate post procedural complication. FINDINGS: A total of approximately 5.0 liters of clear, yellow fluid was removed. IMPRESSION: Successful ultrasound-guided therapeutic paracentesis yielding 5.0 liters of peritoneal fluid. Read by: Brynda Greathouse PA-C Electronically Signed   By: Jerilynn Mages.  Shick M.D.   On: 08/04/2017 13:37    EKG: not done   Labs on Admission: I have personally reviewed the available labs and imaging studies at the time of the admission.  Pertinent labs:   WBC 6.1 Hgb 6.3, 9.1 on 3/21 Platelets 129, 108. On 3/21 Heme positive   Assessment/Plan Principal Problem:   Acute anemia Active Problems:   Thrombocytopenia ([place])   Alcoholism ([place])   Alcoholic cirrhosis of liver with ascites ([place])   Essential hypertension   End stage liver disease ([place])   Smoker   Polysubstance abuse ([place])   Acute anemia -Patient with h/o anemia but recent Hgb 9.1 on 3/21 with incidental finding of Hgb 6.3 today in IR -MCV is 80-100, indicative of normocytic anemia -This generally occurs in chronic disease and acute blood loss anemia -Patient is heme positive (see below) -Will observe for now in med surg bed. -Transfuse 2 units PRBC to start and recheck Hgb afterwards.   Upper GI bleed -Patient has history of alcoholic cirrhosis and esophageal/gastric varices. -It seems likely given his recent brown emesis and heme positive stools that this is another upper GI bleed. -Another potential differential diagnoses is gastric or duodenal ulcer, which is less likely given no abdominal pain.  - GI consulted by ED, will follow up recommendations - NPO for  possible EGD - LR at 75 mL/hr - Start IV pantoprazole 40 mg bid - Zofran IV for nausea - Avoid NSAIDs and SQ heparin -  Maintain IV access (2 large bore IVs if possible). - IV Rocephin 1 g X 7 days for PPx  -May consider Octreotide load and infusion, and beta blocker if he develops hematemesis  Alcoholic cirrhosis with ascites/ESLD -Patient had paracentesis with 5 liters drained off today -CMP is pending today, but based on 3/21 labs his MELD score is 18, MELD-Na is 23, with an estimated 6% mortality risk -Continue Rifaximin  HTN -Holding Maxzide for now  Polysubstance abuse -Patient is somewhat vague about his ETOH and drug use but does acknowledge ongoing use -Will check ETOH level and UDS -Monitor to determine if CIWA is needed - he denies regular or recent ETOH use  Tobacco dependence -Encourage cessation.  This was discussed with the patient and should be reviewed on an ongoing basis.   -Patch ordered at patient request. -Of note, he reports smoking 1-2 cigs/day, but declares that he really needs a patch both here and at the time of d/c.   DVT prophylaxis: SCDs Code Status:  Full - confirmed with patient/family Family Communication: Sister present at the completion of the visit  Disposition Plan:  Home once clinically improved Consults called: GI  Admission status: It is my clinical opinion that referral for OBSERVATION is reasonable and necessary in this patient based on the above information provided. The aforementioned taken together are felt to place the patient at high risk for further clinical deterioration. However it is anticipated that the patient may be medically stable for discharge from the hospital within 24 to 48 hours.     [name] MD [facility]  If note is complete, please contact covering daytime or nighttime physician. [email or web address] Password [facility]  08/04/2017, 4:35 PM

## 2017-08-04 NOTE — ED Provider Notes (Signed)
[facility] EMERGENCY DEPARTMENT Provider Note   CSN: [id number] Arrival date & time: 08/04/17  1335     History   Chief Complaint Chief Complaint  Patient presents with  . Abnormal Lab    HPI Jose [name] is a 62 y.o. male.  62 yo M with a cc of low hgb.  Having some emesis and dark stool.  The patient described it somewhat is coffee ground is.  The emesis was actually a couple days ago.  He is been having about 3 dark bowel movements a day.  He has been feeling mildly fatigued but feels better currently.  He had a paracentesis done today and had labs drawn as part of protocol and he was found to have a hemoglobin of 6.3.  This is down from 9.1 just a few weeks ago.  He called his GI doctor who sent him here to be admitted.  The history is provided by the patient.  Abnormal Lab  Illness  This is a new problem. The current episode started yesterday. The problem occurs constantly. The problem has not changed since onset.Pertinent negatives include no chest pain, no abdominal pain, no headaches and no shortness of breath. Nothing aggravates the symptoms. Nothing relieves the symptoms. He has tried nothing for the symptoms. The treatment provided no relief.    Past Medical History:  Diagnosis Date  . Alcohol dependence ([place])   . Ascites   . Cirrhosis ([place])   . Depression   . Esophageal varices ([place]) 07/2016   per CT  . 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 Hepatitis C    Hepatitis C  . HOH (hard of hearing)   . Hypertension   . Inguinal hernia    right  . QT prolongation   . Shortness of breath    with exertion  . Thrombocytopenia [facility])     Patient Active Problem List   Diagnosis Date Noted  . Anemia 08/04/2017  . Acute anemia 07/13/2017  . Symptomatic anemia 07/12/2017  . Hyponatremia 07/12/2017  . Deficiency anemia 07/08/2017  . Smoker 01/08/2017  . Pancytopenia, acquired ([place]) 01/08/2017  . Pain in right leg 10/06/2016  . End stage liver  disease ([place]) 09/10/2016  . Palliative care encounter   . Liver mass   . 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 Hepatitis C, chronic ([place]) 01/04/2016  . Right inguinal hernia 09/12/2015  . Essential hypertension 09/12/2015  . Gastroesophageal reflux disease without esophagitis 09/12/2015  . Protein-calorie malnutrition, severe ([place]) 09/12/2015  . Anemia of chronic disease 09/12/2015  . Arthritis of left knee 09/12/2015  . Elevated LFTs   . Alcoholic cirrhosis of liver with ascites ([place]) 07/25/2015  . Macrocytic anemia 07/23/2015  . Alcoholism ([place]) 07/12/2015  . Hepatic encephalopathy ([place]) 06/26/2015  . Adenomatous polyps 10/18/2013  . Unspecified vitamin D deficiency 07/13/2013  . Atopic dermatitis 04/28/2013  . Thrombocytopenia ([place]) 04/28/2013    Past Surgical History:  Procedure Laterality Date  . CIRCUMCISION    . COLONOSCOPY  march 2015   Dr. Renee Harder: nodular mucosa at appendiceal orifice, tubular adenoma, extremely poor prep  . COLONOSCOPY    . ESOPHAGOGASTRODUODENOSCOPY (EGD) WITH PROPOFOL N/A 07/13/2017   Procedure: ESOPHAGOGASTRODUODENOSCOPY (EGD) WITH PROPOFOL;  Surgeon: Doran Stabler, MD;  [facility];  Service: Gastroenterology;  Laterality: N/A;  . IR PARACENTESIS  08/04/2017  . TOOTH EXTRACTION N/A 05/15/2017   Procedure: DENTAL RESTORATION and multiple teeth EXTRACTIONS;  Surgeon: Diona Browner, DDS;  Location:  [facility] OR;  Service: Oral Surgery;  Laterality: N/A;        Home Medications    Prior to Admission medications   Medication Sig Start Date End Date Taking? Authorizing Provider  cholecalciferol (VITAMIN D) 1000 units tablet Take 1 capsule by mouth daily.    [provider]  fluticasone (FLONASE) 50 MCG/ACT nasal spray Place 1 spray into both nostrils daily as needed for allergies.     [provider]  furosemide (LASIX) 40 MG tablet Take 1 tablet (40 mg total) by mouth daily. Patient not taking: Reported on 05/11/2017 11/14/16    Doran Stabler, MD  lactulose (CHRONULAC) 10 GM/15ML solution Take 45 mLs (30 g total) by mouth 4 (four) times daily. Patient not taking: Reported on 07/31/2017 08/06/16   Reyne Dumas, MD  omeprazole (PRILOSEC) 20 MG capsule Take 1 capsule (20 mg total) by mouth at bedtime. Patient not taking: Reported on 07/31/2017 12/22/16   Thayer Headings, MD  OxyCODONE HCl, Abuse Deter, (OXAYDO) 5 MG TABA Take 1 tablet by mouth 2 (two) times daily.    [provider]  oxyCODONE-acetaminophen (PERCOCET) 5-325 MG tablet Take 1 tablet by mouth every 4 (four) hours as needed for severe pain. Patient not taking: Reported on 07/12/2017 05/15/17   Diona Browner, DDS  PROAIR HFA 108 [phone] Base) MCG/ACT inhaler Inhale 2 puffs into the lungs 4 (four) times daily as needed for shortness of breath or wheezing. 04/02/17   [provider]  rifaximin (XIFAXAN) 550 MG TABS tablet Take 1 tablet (550 mg total) by mouth 2 (two) times daily. 11/14/16   Doran Stabler, MD  spironolactone (ALDACTONE) 100 MG tablet TAKE 1 TABLET (100 MG TOTAL) BY MOUTH DAILY. Patient not taking: Reported on 05/11/2017 03/03/17   Doran Stabler, MD  triamterene-hydrochlorothiazide (MAXZIDE-25) 37.5-25 MG tablet Take 1 tablet by mouth daily.    [provider]  VITAMIN A PO Take 1 tablet by mouth daily.    [provider]    Family History Family History  Problem Relation Age of Onset  . Breast cancer Other   . Diabetes Other   . Hypertension Other   . Breast cancer Mother   . Hypertension Mother   . Diabetes Father   . Hypertension Sister   . Heart disease Sister   . Hypertension Paternal Grandmother   . Colon cancer Neg Hx     Social History Social History   Tobacco Use  . Smoking status: Current Some Day Smoker    Packs/day: 0.12    Years: 25.00    Pack years: 3.00    Types: Cigarettes  . Smokeless tobacco: Never Used  Substance Use Topics  . Alcohol use: Yes    Comment: denies any in the  last few weeks  . Drug use: No    Types: Marijuana, Cocaine    Comment: Last time for cocaine a few weeks ago ;Marijuana- intermittent     Allergies   Patient has no known allergies.   Review of Systems Review of Systems  Constitutional: Negative for chills and fever.  HENT: Negative for congestion and facial swelling.   Eyes: Negative for discharge and visual disturbance.  Respiratory: Negative for shortness of breath.   Cardiovascular: Negative for chest pain and palpitations.  Gastrointestinal: Negative for abdominal pain, diarrhea and vomiting.  Musculoskeletal: Negative for arthralgias and myalgias.  Skin: Negative for color change and rash.  Neurological: Negative for tremors, syncope and headaches.  Psychiatric/Behavioral: Negative for confusion and dysphoric mood.     Physical Exam Updated Vital Signs BP 124/69   Pulse (!) 52   Temp 97.7 F (36.5 C) (Oral)   Resp 18   SpO2 95%   Physical Exam  Constitutional: He is oriented to person, place, and time. He appears well-developed and well-nourished.  HENT:  Head: Normocephalic and atraumatic.  Eyes: Pupils are equal, round, and reactive to light. EOM are normal.  Neck: Normal range of motion. Neck supple. No JVD present.  Cardiovascular: Normal rate and regular rhythm. Exam reveals no gallop and no friction rub.  No murmur heard. Pulmonary/Chest: No respiratory distress. He has no wheezes.  Abdominal: He exhibits distension (mild). He exhibits no mass. There is no tenderness. There is no rebound and no guarding.  Musculoskeletal: Normal range of motion.  Neurological: He is alert and oriented to person, place, and time.  Skin: No rash noted. No pallor.  Psychiatric: He has a normal mood and affect. His behavior is normal.  Nursing note and vitals reviewed.    ED Treatments / Results  Labs (all labs ordered are listed, but only abnormal results are displayed) Labs Reviewed  POC OCCULT BLOOD, ED  PREPARE  RBC (CROSSMATCH)  TYPE AND SCREEN    EKG None  Radiology Ir Paracentesis  Result Date: 08/04/2017 INDICATION: Patient with alcoholic cirrhosis, hep C, enlarging liver lesion, ascites. Request is made for therapeutic paracentesis. EXAM: ULTRASOUND GUIDED THERAPEUTIC PARACENTESIS MEDICATIONS: 10 mL 2% lidocaine COMPLICATIONS: None immediate. PROCEDURE: Informed written consent was obtained from the patient after a discussion of the risks, benefits and alternatives to treatment. A timeout was performed prior to the initiation of the procedure. Initial ultrasound scanning demonstrates a large amount of ascites within the right lower abdominal quadrant. The right lower abdomen was prepped and draped in the usual sterile fashion. 2% lidocaine was used for local anesthesia. Following this, a 19 gauge, 7-cm, Yueh catheter was introduced. An ultrasound image was saved for documentation purposes. The paracentesis was performed. The catheter was removed and a dressing was applied. The patient tolerated the procedure well without immediate post procedural complication. FINDINGS: A total of approximately 5.0 liters of clear, yellow fluid was removed. IMPRESSION: Successful ultrasound-guided therapeutic paracentesis yielding 5.0 liters of peritoneal fluid. Read by: Brynda Greathouse PA-C Electronically Signed   By: Jerilynn Mages.  Shick M.D.   On: 08/04/2017 13:37    Procedures Procedures (including critical care time)  Medications Ordered in ED Medications  0.9 %  sodium chloride infusion (has no administration in time range)  sodium chloride 0.9 % bolus 1,000 mL (1,000 mLs Intravenous New Bag/Given 08/04/17 1431)  pantoprazole (PROTONIX) injection 40 mg (40 mg Intravenous Given 08/04/17 1431)  cefTRIAXone (ROCEPHIN) 1 g in sodium chloride 0.9 % 100 mL IVPB (1 g Intravenous New Bag/Given 08/04/17 1435)     Initial Impression / Assessment and Plan / ED Course  I have reviewed the triage vital signs and the nursing  notes.  Pertinent labs & imaging results that were available during my care of the patient were reviewed by me and considered in my medical decision making (see chart for details).     62 yo M with a cc of low hemoglobin level.  This was noted incidentally at a check where he was getting a paracentesis.  The patient has felt mildly fatigued for the past day or so had one episode that he thought might of been coffee ground.  He called his GI doctor  who sent him here for admission.  Will transfuse 1 unit of blood.  Soft brown stool on my rectal exam.  We will give the patient blood Rocephin Protonix discussed with hospitalist GI.  CRITICAL CARE Performed by: Cecilio Asper   Total critical care time: 35 minutes  Critical care time was exclusive of separately billable procedures and treating other patients.  Critical care was necessary to treat or prevent imminent or life-threatening deterioration.  Critical care was time spent personally by me on the following activities: development of treatment plan with patient and/or surrogate as well as nursing, discussions with consultants, evaluation of patient's response to treatment, examination of patient, obtaining history from patient or surrogate, ordering and performing treatments and interventions, ordering and review of laboratory studies, ordering and review of radiographic studies, pulse oximetry and re-evaluation of patient's condition.  The patients results and plan were reviewed and discussed.   Any x-rays performed were independently reviewed by myself.   Differential diagnosis were considered with the presenting HPI.  Medications  0.9 %  sodium chloride infusion (has no administration in time range)  sodium chloride 0.9 % bolus 1,000 mL (1,000 mLs Intravenous New Bag/Given 08/04/17 1431)  pantoprazole (PROTONIX) injection 40 mg (40 mg Intravenous Given 08/04/17 1431)  cefTRIAXone (ROCEPHIN) 1 g in sodium chloride 0.9 % 100 mL  IVPB (1 g Intravenous New Bag/Given 08/04/17 1435)    Vitals:   08/04/17 1338 08/04/17 1345 08/04/17 1400 08/04/17 1415  BP: 115/70 115/70 124/76 124/69  Pulse: (!) 59 66 60 (!) 52  Resp: 18     Temp: 97.7 F (36.5 C)     TempSrc: Oral     SpO2: 95% 98% 98% 95%    Final diagnoses:  Upper GI bleed    Admission/ observation were discussed with the admitting physician, patient and/or family and they are comfortable with the plan.   Final Clinical Impressions(s) / ED Diagnoses   Final diagnoses:  Upper GI bleed    ED Discharge Orders    None       Deno Etienne, DO 08/04/17 1523

## 2017-08-04 NOTE — H&P (Signed)
Chief Complaint: Patient was seen in consultation today for liver lesion biopsy and paracentesis at the request of Danis,Henry L III  Referring Physician(s): [name]  Supervising Physician: Daryll Brod  Patient Status: [facility] - Out-pt  History of Present Illness: Jose [name] is a 62 y.o. male   Pancytopenia ETOH Cirrhosis Hep C Ascites  Liver mass; enlarging Elevated liver functions  Previous bx 09/06/15: Liver, needle/core biopsy, Indeterminate Liver Mass - ATYPICAL HEPATOCYTIC LESION WITH INFLAMMATION AND STEATOSIS. - SEE MICROSCOPIC DESCRIPTION.  [place] 07/12/17: IMPRESSION: 1. Interval increase in size of mixed echogenicity mass within the right hepatic lobe, potentially representing hepatocellular carcinoma. 2. Stones and sludge within the gallbladder lumen with associated wall thickening. Wall thickening is nonspecific in the setting of cirrhosis. 3. Cirrhosis. 4. Bidirectional flow demonstrated within the portal vein  MRI 07/21/17: IMPRESSION: 1. Moderately motion degraded exam. 2. Significant enlargement of a heterogeneous, infiltrative lesion centered in the right lobe of the liver. This is favored to represent progressive hepatocellular carcinoma. Imaging findings are not diagnostic, however given degradation of today's exam and the somewhat unusual lesion morphology. 3. Right portal vein not well evaluated, and tumor involvement cannot be excluded. Liver protocol CT may be informative given limitation of current exam. 4. Cirrhosis and portal venous hypertension with increased ascites and development of small bilateral pleural effusions. 5. New right lower lobe nodularity. If the patient is diagnosed with hepatocellular carcinoma, consider dedicated chest CT for staging  Scheduled now for paracentesis then liver lesion biopsy per Dr Loletha Carrow and Dr Alvy Bimler request Approved by Dr Vernard Gambles - see media     Past Medical History:  Diagnosis Date    . Alcohol abuse   . Alcohol dependence ([place])   . Alcoholism ([place]) 07/12/2015  . Ascites   . Cirrhosis ([place])   . Depression   . 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 Hepatitis C    Hepatitis C  . HOH (hard of hearing)   . Hypertension   . Inguinal hernia    right  . QT prolongation   . Shortness of breath    with exertion  . Thrombocytopenia ([place])     Past Surgical History:  Procedure Laterality Date  . CIRCUMCISION    . COLONOSCOPY  march 2015   Dr. Renee Harder: nodular mucosa at appendiceal orifice, tubular adenoma, extremely poor prep  . COLONOSCOPY    . ESOPHAGOGASTRODUODENOSCOPY (EGD) WITH PROPOFOL N/A 07/13/2017   Procedure: ESOPHAGOGASTRODUODENOSCOPY (EGD) WITH PROPOFOL;  Surgeon: Doran Stabler, MD;  Location: [facility];  Service: Gastroenterology;  Laterality: N/A;  . TOOTH EXTRACTION N/A 05/15/2017   Procedure: DENTAL RESTORATION and multiple teeth EXTRACTIONS;  Surgeon: Diona Browner, DDS;  Location: [employer];  Service: Oral Surgery;  Laterality: N/A;    Allergies: Patient has no known allergies.  Medications: Prior to Admission medications   Medication Sig Start Date End Date Taking? Authorizing Provider  cholecalciferol (VITAMIN D) 1000 units tablet Take 1 capsule by mouth daily.   Yes [provider]  fluticasone (FLONASE) 50 MCG/ACT nasal spray Place 1 spray into both nostrils daily as needed for allergies.    Yes [provider]  OxyCODONE HCl, Abuse Deter, (OXAYDO) 5 MG TABA Take 1 tablet by mouth 2 (two) times daily.   Yes [provider]  PROAIR HFA 108 (90 Base) MCG/ACT inhaler Inhale 2 puffs into the lungs 4 (four) times daily as  needed for shortness of breath or wheezing. 04/02/17  Yes [provider]  rifaximin (XIFAXAN) 550 MG TABS tablet Take 1 tablet (550 mg total) by mouth 2 (two) times daily. 11/14/16  Yes Danis, Kirke Corin, MD  triamterene-hydrochlorothiazide  (MAXZIDE-25) 37.5-25 MG tablet Take 1 tablet by mouth daily.   Yes [provider]  VITAMIN A PO Take 1 tablet by mouth daily.   Yes [provider]  furosemide (LASIX) 40 MG tablet Take 1 tablet (40 mg total) by mouth daily. Patient not taking: Reported on 05/11/2017 11/14/16   Doran Stabler, MD  lactulose (CHRONULAC) 10 GM/15ML solution Take 45 mLs (30 g total) by mouth 4 (four) times daily. Patient not taking: Reported on 07/31/2017 08/06/16   Reyne Dumas, MD  omeprazole (PRILOSEC) 20 MG capsule Take 1 capsule (20 mg total) by mouth at bedtime. Patient not taking: Reported on 07/31/2017 12/22/16   Thayer Headings, MD  oxyCODONE-acetaminophen (PERCOCET) 5-325 MG tablet Take 1 tablet by mouth every 4 (four) hours as needed for severe pain. Patient not taking: Reported on 07/12/2017 05/15/17   Diona Browner, DDS  spironolactone (ALDACTONE) 100 MG tablet TAKE 1 TABLET (100 MG TOTAL) BY MOUTH DAILY. Patient not taking: Reported on 05/11/2017 03/03/17   Doran Stabler, MD     Family History  Problem Relation Age of Onset  . Breast cancer Other   . Diabetes Other   . Hypertension Other   . Breast cancer Mother   . Hypertension Mother   . Diabetes Father   . Hypertension Sister   . Heart disease Sister   . Hypertension Paternal Grandmother   . Colon cancer Neg Hx     Social History   Socioeconomic History  . Marital status: Single    Spouse name: Not on file  . Number of children: Not on file  . Years of education: Not on file  . Highest education level: Not on file  Occupational History  . Not on file  Social Needs  . Financial resource strain: Not on file  . Food insecurity:    Worry: Not on file    Inability: Not on file  . Transportation needs:    Medical: Not on file    Non-medical: Not on file  Tobacco Use  . Smoking status: Current Some Day Smoker    Packs/day: 0.12    Years: 25.00    Pack years: 3.00    Types: Cigarettes  . Smokeless tobacco:  Never Used  Substance and Sexual Activity  . Alcohol use: Yes    Comment: A couple beers every few days   . Drug use: No    Types: Marijuana, Cocaine    Comment: Last time for cocaine 05/09/17 ;[place]- last  time 05/14/17  . Sexual activity: Not Currently    Comment: MPO[employer] sister and Doug senior  Lifestyle  . Physical activity:    Days per week: Not on file    Minutes per session: Not on file  . Stress: Not on file  Relationships  . Social connections:    Talks on phone: Not on file    Gets together: Not on file    Attends religious service: Not on file    Active member of club or organization: Not on file    Attends meetings of clubs or organizations: Not on file    Relationship status: Not on file  Other Topics Concern  . Not on file  Social History Narrative   **  Merged History Encounter **        Review of Systems: A 12 point ROS discussed and pertinent positives are indicated in the HPI above.  All other systems are negative.  Review of Systems  Constitutional: Positive for activity change, appetite change and fatigue. Negative for fever.  Respiratory: Positive for shortness of breath. Negative for cough.   Gastrointestinal: Positive for abdominal pain.  Musculoskeletal: Positive for back pain.  Neurological: Positive for weakness.  Psychiatric/Behavioral: Negative for behavioral problems and confusion.    Vital Signs: BP 116/75   Pulse 65   Temp 97.6 F (36.4 C) (Oral)   Resp 18   SpO2 100%   Physical Exam  Constitutional: He is oriented to person, place, and time.  Cardiovascular: Normal rate.  Murmur heard. Pulmonary/Chest: Effort normal and breath sounds normal.  Abdominal: Soft. He exhibits distension.  Musculoskeletal: Normal range of motion.  Neurological: He is alert and oriented to person, place, and time.  Skin: Skin is warm and dry.  Psychiatric: He has a normal mood and affect. His behavior is normal. Judgment and thought content normal.    Nursing note and vitals reviewed.   Imaging: Ct Head Wo Contrast  Result Date: 07/12/2017 CLINICAL DATA:  Patient with left upper extremity numbness. EXAM: CT HEAD WITHOUT CONTRAST TECHNIQUE: Contiguous axial images were obtained from the base of the skull through the vertex without intravenous contrast. COMPARISON:  Brain CT 08/14/2016. FINDINGS: Brain: Ventricles and sulci are prominent compatible with atrophy. Periventricular and subcortical white matter hypodensity compatible with chronic microvascular ischemic changes. No evidence for acute cortically based infarct, intracranial hemorrhage, mass lesion or mass-effect. Vascular: Unremarkable. Skull: Intact. Sinuses/Orbits: Paranasal sinuses are well aerated. Mastoid air cells unremarkable. Orbits unremarkable. Chronic deformity of the medial orbital walls bilaterally. Other: None. IMPRESSION: No acute intracranial process. Atrophy and chronic microvascular ischemic changes. Electronically Signed   By: Lovey Newcomer M.D.   On: 07/12/2017 12:46   US Abdomen Complete  Result Date: 07/12/2017 CLINICAL DATA:  Patient with history of hepatitis C, cirrhosis. Hypertension. Elevated LFTs. EXAM: ABDOMEN ULTRASOUND COMPLETE COMPARISON:  Ultrasound 03/27/2017. FINDINGS: Gallbladder: Sludge within the gallbladder lumen. Gallbladder wall thickening measuring up to 3.6 mm. Negative sonographic Murphy's sign. Common bile duct: Diameter: 4 mm Liver: The liver is heterogeneous in echogenicity and nodular in contour. Within the right hepatic lobe there is a 5.9 x 6.5 x 5.2 cm mixed echogenicity mass, previously measuring 4.4 x 3.9 x 4.2 cm. 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 IVC: No abnormality visualized. Pancreas: Visualized portion unremarkable. Spleen: Size and appearance within normal limits. Right Kidney: Length: 11.5 cm. Echogenicity within normal limits. No mass or hydronephrosis visualized. Left Kidney: Length: 12.2 cm. Echogenicity within  normal limits. No mass or hydronephrosis visualized. Abdominal aorta: No aneurysm visualized. Other findings: None. IMPRESSION: 1. Interval increase in size of mixed echogenicity mass within the right hepatic lobe, potentially representing hepatocellular carcinoma. 2. Stones and sludge within the gallbladder lumen with associated wall thickening. Wall thickening is nonspecific in the setting of cirrhosis. 3. Cirrhosis. 4. 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 Electronically Signed   By: Lovey Newcomer M.D.   On: 07/12/2017 12:42   Mr Liver W GE Contrast  Result Date: 07/21/2017 CLINICAL DATA:  Hepatitis-C.  Cirrhosis.  Hemochromatosis. EXAM: MRI ABDOMEN WITHOUT AND WITH CONTRAST TECHNIQUE: Multiplanar multisequence MR imaging of the abdomen was performed both before and after the administration of intravenous contrast. CONTRAST:  22mL MULTIHANCE GADOBENATE DIMEGLUMINE 529 MG/ML  IV SOLN COMPARISON:  07/12/2017 ultrasound.  MRI 08/05/2016. FINDINGS: Moderate motion degradation throughout. Lower chest: New small bilateral pleural effusions. Normal heart size. Right lower lobe pulmonary nodules on the order of 3 mm x 2 are new since the prior MRI and CT. There is probable atelectasis at the left lung base. Periesophageal varices. Hepatobiliary: Cirrhosis. Probable cyst in the left hepatic lobe. Significant enlargement of a partially branching, precontrast T1 hyperintense, arterial phase hyperenhancing, delayed hypoenhancing liver lesion. This may extend minimally into the medial segment left liver lobe. Centered in segments 6 and 7. Superiorly, this measures on the order of 4.0 x 5.5 cm on image [id number]. More inferiorly, contiguous rounded portion measures 4.3 x 4.6 cm on image 53. Gallbladder wall thickening is again identified and nonspecific in the setting of cirrhosis. No biliary duct dilatation. Pancreas:  Grossly normal period without duct dilatation. Spleen:  Presumed siderotic nodules within  the medial spleen. Adrenals/Urinary Tract: Normal adrenal glands. Normal kidneys, without hydronephrosis. Stomach/Bowel: Grossly normal stomach and abdominal bowel loops. Vascular/Lymphatic: Portal venous hypertension, with recannulized periumbilical vein and gastric varices. Patent main portal vein. Right portal vein not well evaluated. No gross abdominal adenopathy. Other:  Increase in moderate volume ascites. Musculoskeletal: No acute osseous abnormality. IMPRESSION: 1. Moderately motion degraded exam. 2. Significant enlargement of a heterogeneous, infiltrative lesion centered in the right lobe of the liver. This is favored to represent progressive hepatocellular carcinoma. Imaging findings are not diagnostic, however given degradation of today's exam and the somewhat unusual lesion morphology. 3. Right portal vein not well evaluated, and tumor involvement cannot be excluded. Liver protocol CT may be informative given limitation of current exam. 4. Cirrhosis and portal venous hypertension with increased ascites and development of small bilateral pleural effusions. 5. New right lower lobe nodularity. If the patient is diagnosed with hepatocellular carcinoma, consider dedicated chest CT for staging. Electronically Signed   By: Abigail Miyamoto M.D.   On: 07/21/2017 10:53    Labs:  CBC: Recent Labs    07/13/17 0503  07/14/17 0528 07/14/17 1459 07/15/17 0551 07/16/17 1158 07/16/17 1159  WBC 5.7  --  4.2  --  4.3 3.7*  --   HGB 7.6*  --  6.6* 8.3* 8.2* 9.1*  --   HCT 21.6*   < > 19.6* 24.6* 23.9* 28.2* 28.3*  PLT 124*  --  91*  --  95* 108*  --    < > = values in this interval not displayed.    COAGS: Recent Labs    08/05/16 0625 09/10/16 1451 07/12/17 0854 07/15/17 0551  INR 1.21 1.4* 1.62 1.54    BMP: Recent Labs    07/13/17 0503 07/14/17 0528 07/15/17 0551 07/16/17 1158  NA 126* 129* 130* 131*  K 3.1* 3.4* 3.5 3.4*  CL 97* 101 102 103  CO2 21* 22 23 22   GLUCOSE 124* 128* 124*  146*  BUN 35* 26* 18 14  CALCIUM 7.4* 7.9* 7.7* 7.9*  CREATININE 1.41* 1.20 1.08 1.15  GFRNONAA 52* >60 >60 >60  GFRAA >60 >60 >60 >60    LIVER FUNCTION TESTS: Recent Labs    07/13/17 0503 07/14/17 0528 07/15/17 0551 07/16/17 1158  BILITOT 2.1* 1.9* 2.1* 2.5*  AST 107* 81* 96* 107*  ALT 74* 60 63 77*  ALKPHOS 157* 135* 171* 218*  PROT 5.1* 5.4* 5.3* 6.0*  ALBUMIN 1.9* 2.8* 2.6* 2.8*    TUMOR MARKERS: Recent Labs    08/05/16 1241 08/15/16 0947  AFPTM 23.7* 27.1*  Assessment and Plan:  ETOH; Cirrhosis Hep C Liver mass-- enlarging Previous bx 2017: atypical Now increasing LFTs; ascites; enlarging mass Scheduled for Bx today and paracentesis Risks and benefits discussed with the patient including, but not limited to bleeding, infection, damage to adjacent structures or low yield requiring additional tests.  All of the patient's questions were answered, patient is agreeable to proceed. Consent signed and in chart.   Thank you for this interesting consult.  I greatly enjoyed meeting Jose [name] and look forward to participating in their care.  A copy of this report was sent to the requesting provider on this date.  Electronically Signed: Lavonia Drafts, PA-C 08/04/2017, 11:27 AM   I spent a total of  30 Minutes   in face to face in clinical consultation, greater than 50% of which was counseling/coordinating care for liver lesion biopsy and paracentesis

## 2017-08-05 ENCOUNTER — Encounter (HOSPITAL_COMMUNITY): Payer: Self-pay | Admitting: Gastroenterology

## 2017-08-05 ENCOUNTER — Inpatient Hospital Stay (HOSPITAL_COMMUNITY): Payer: Medicaid Other

## 2017-08-05 ENCOUNTER — Ambulatory Visit: Payer: Medicaid Other | Admitting: Gastroenterology

## 2017-08-05 ENCOUNTER — Observation Stay (HOSPITAL_COMMUNITY): Payer: Medicaid Other | Admitting: Anesthesiology

## 2017-08-05 ENCOUNTER — Encounter (HOSPITAL_COMMUNITY)
Admission: EM | Disposition: A | Discharge status: Home / Self Care | Payer: Self-pay | Source: Home / Self Care | Attending: Internal Medicine

## 2017-08-05 DIAGNOSIS — I129 Hypertensive chronic kidney disease with stage 1 through stage 4 chronic kidney disease, or unspecified chronic kidney disease: Secondary | ICD-10-CM | POA: Diagnosis present

## 2017-08-05 DIAGNOSIS — K922 Gastrointestinal hemorrhage, unspecified: Secondary | ICD-10-CM | POA: Diagnosis not present

## 2017-08-05 DIAGNOSIS — N179 Acute kidney failure, unspecified: Secondary | ICD-10-CM | POA: Diagnosis not present

## 2017-08-05 DIAGNOSIS — H9190 Unspecified hearing loss, unspecified ear: Secondary | ICD-10-CM | POA: Diagnosis present

## 2017-08-05 DIAGNOSIS — D62 Acute posthemorrhagic anemia: Secondary | ICD-10-CM | POA: Diagnosis present

## 2017-08-05 DIAGNOSIS — N433 Hydrocele, unspecified: Secondary | ICD-10-CM | POA: Diagnosis present

## 2017-08-05 DIAGNOSIS — N183 Chronic kidney disease, stage 3 (moderate): Secondary | ICD-10-CM | POA: Diagnosis present

## 2017-08-05 DIAGNOSIS — D6959 Other secondary thrombocytopenia: Secondary | ICD-10-CM | POA: Diagnosis present

## 2017-08-05 DIAGNOSIS — N170 Acute kidney failure with tubular necrosis: Secondary | ICD-10-CM | POA: Diagnosis present

## 2017-08-05 DIAGNOSIS — F17210 Nicotine dependence, cigarettes, uncomplicated: Secondary | ICD-10-CM | POA: Diagnosis present

## 2017-08-05 DIAGNOSIS — E8770 Fluid overload, unspecified: Secondary | ICD-10-CM | POA: Diagnosis present

## 2017-08-05 DIAGNOSIS — K7031 Alcoholic cirrhosis of liver with ascites: Secondary | ICD-10-CM | POA: Diagnosis not present

## 2017-08-05 DIAGNOSIS — B1710 Acute hepatitis C without hepatic coma: Secondary | ICD-10-CM | POA: Diagnosis not present

## 2017-08-05 DIAGNOSIS — F1910 Other psychoactive substance abuse, uncomplicated: Secondary | ICD-10-CM

## 2017-08-05 DIAGNOSIS — E876 Hypokalemia: Secondary | ICD-10-CM | POA: Diagnosis present

## 2017-08-05 DIAGNOSIS — D649 Anemia, unspecified: Secondary | ICD-10-CM | POA: Diagnosis present

## 2017-08-05 DIAGNOSIS — K767 Hepatorenal syndrome: Secondary | ICD-10-CM | POA: Diagnosis present

## 2017-08-05 DIAGNOSIS — E871 Hypo-osmolality and hyponatremia: Secondary | ICD-10-CM | POA: Diagnosis present

## 2017-08-05 DIAGNOSIS — R918 Other nonspecific abnormal finding of lung field: Secondary | ICD-10-CM | POA: Diagnosis present

## 2017-08-05 DIAGNOSIS — C220 Liver cell carcinoma: Secondary | ICD-10-CM | POA: Diagnosis present

## 2017-08-05 DIAGNOSIS — K2970 Gastritis, unspecified, without bleeding: Secondary | ICD-10-CM

## 2017-08-05 DIAGNOSIS — F1490 Cocaine use, unspecified, uncomplicated: Secondary | ICD-10-CM | POA: Diagnosis present

## 2017-08-05 DIAGNOSIS — R34 Anuria and oliguria: Secondary | ICD-10-CM | POA: Diagnosis present

## 2017-08-05 DIAGNOSIS — K7290 Hepatic failure, unspecified without coma: Secondary | ICD-10-CM | POA: Diagnosis not present

## 2017-08-05 DIAGNOSIS — R933 Abnormal findings on diagnostic imaging of other parts of digestive tract: Secondary | ICD-10-CM | POA: Diagnosis not present

## 2017-08-05 DIAGNOSIS — B1920 Unspecified viral hepatitis C without hepatic coma: Secondary | ICD-10-CM | POA: Diagnosis present

## 2017-08-05 DIAGNOSIS — I864 Gastric varices: Secondary | ICD-10-CM | POA: Diagnosis present

## 2017-08-05 DIAGNOSIS — K264 Chronic or unspecified duodenal ulcer with hemorrhage: Secondary | ICD-10-CM | POA: Diagnosis present

## 2017-08-05 DIAGNOSIS — Z7189 Other specified counseling: Secondary | ICD-10-CM | POA: Diagnosis not present

## 2017-08-05 DIAGNOSIS — K429 Umbilical hernia without obstruction or gangrene: Secondary | ICD-10-CM | POA: Diagnosis present

## 2017-08-05 DIAGNOSIS — K269 Duodenal ulcer, unspecified as acute or chronic, without hemorrhage or perforation: Secondary | ICD-10-CM | POA: Diagnosis not present

## 2017-08-05 DIAGNOSIS — F1020 Alcohol dependence, uncomplicated: Secondary | ICD-10-CM | POA: Diagnosis present

## 2017-08-05 DIAGNOSIS — R160 Hepatomegaly, not elsewhere classified: Secondary | ICD-10-CM | POA: Diagnosis not present

## 2017-08-05 HISTORY — PX: ESOPHAGOGASTRODUODENOSCOPY: SHX5428

## 2017-08-05 LAB — URINALYSIS, ROUTINE W REFLEX MICROSCOPIC
Bilirubin Urine: NEGATIVE
GLUCOSE, UA: NEGATIVE mg/dL
Hgb urine dipstick: NEGATIVE
Ketones, ur: NEGATIVE mg/dL
LEUKOCYTES UA: NEGATIVE
Nitrite: NEGATIVE
PH: 5 (ref 5.0–8.0)
Protein, ur: NEGATIVE mg/dL
Specific Gravity, Urine: 1.006 (ref 1.005–1.030)

## 2017-08-05 LAB — TYPE AND SCREEN
ABO/RH(D): O POS
Antibody Screen: NEGATIVE
UNIT DIVISION: 0
UNIT DIVISION: 0

## 2017-08-05 LAB — CBC
HCT: 23.4 % — ABNORMAL LOW (ref 39.0–52.0)
HEMATOCRIT: 23.1 % — AB (ref 39.0–52.0)
HEMOGLOBIN: 7.9 g/dL — AB (ref 13.0–17.0)
Hemoglobin: 7.7 g/dL — ABNORMAL LOW (ref 13.0–17.0)
MCH: 29.5 pg (ref 26.0–34.0)
MCH: 30.2 pg (ref 26.0–34.0)
MCHC: 33.3 g/dL (ref 30.0–36.0)
MCHC: 33.8 g/dL (ref 30.0–36.0)
MCV: 88.5 fL (ref 78.0–100.0)
MCV: 89.3 fL (ref 78.0–100.0)
Platelets: 100 10*3/uL — ABNORMAL LOW (ref 150–400)
Platelets: 99 10*3/uL — ABNORMAL LOW (ref 150–400)
RBC: 2.61 MIL/uL — AB (ref 4.22–5.81)
RBC: 2.62 MIL/uL — ABNORMAL LOW (ref 4.22–5.81)
RDW: 17.6 % — ABNORMAL HIGH (ref 11.5–15.5)
RDW: 18.7 % — ABNORMAL HIGH (ref 11.5–15.5)
WBC: 5.2 10*3/uL (ref 4.0–10.5)
WBC: 6 10*3/uL (ref 4.0–10.5)

## 2017-08-05 LAB — BASIC METABOLIC PANEL
ANION GAP: 10 (ref 5–15)
BUN: 54 mg/dL — ABNORMAL HIGH (ref 6–20)
CALCIUM: 7.7 mg/dL — AB (ref 8.9–10.3)
CO2: 19 mmol/L — AB (ref 22–32)
Chloride: 98 mmol/L — ABNORMAL LOW (ref 101–111)
Creatinine, Ser: 1.95 mg/dL — ABNORMAL HIGH (ref 0.61–1.24)
GFR calc Af Amer: 41 mL/min — ABNORMAL LOW (ref >60)
GFR, EST NON AFRICAN AMERICAN: 35 mL/min — AB (ref >60)
Glucose, Bld: 131 mg/dL — ABNORMAL HIGH (ref 65–99)
Potassium: 3.6 mmol/L (ref 3.5–5.1)
Sodium: 127 mmol/L — ABNORMAL LOW (ref 135–145)

## 2017-08-05 LAB — BPAM RBC
BLOOD PRODUCT EXPIRATION DATE: 201904162359
Blood Product Expiration Date: 201904162359
ISSUE DATE / TIME: 201904091841
ISSUE DATE / TIME: 201904091538
UNIT TYPE AND RH: 5100
Unit Type and Rh: 5100

## 2017-08-05 LAB — SODIUM, URINE, RANDOM: Sodium, Ur: 70 mmol/L

## 2017-08-05 LAB — CREATININE, URINE, RANDOM: Creatinine, Urine: 31.46 mg/dL

## 2017-08-05 LAB — OSMOLALITY, URINE: Osmolality, Ur: 318 mOsm/kg (ref 300–900)

## 2017-08-05 SURGERY — EGD (ESOPHAGOGASTRODUODENOSCOPY)
Anesthesia: Monitor Anesthesia Care

## 2017-08-05 MED ORDER — LIDOCAINE 2% (20 MG/ML) 5 ML SYRINGE
INTRAMUSCULAR | Status: DC | PRN
  Administered 2017-08-05: 60 mg via INTRAVENOUS

## 2017-08-05 MED ORDER — PROPOFOL 10 MG/ML IV BOLUS
INTRAVENOUS | Status: DC | PRN
  Administered 2017-08-05: 40 mg via INTRAVENOUS
  Administered 2017-08-05: 20 mg via INTRAVENOUS
  Administered 2017-08-05 (×2): 50 mg via INTRAVENOUS
  Administered 2017-08-05: 60 mg via INTRAVENOUS

## 2017-08-05 MED ORDER — EPHEDRINE SULFATE-NACL 50-0.9 MG/10ML-% IV SOSY
PREFILLED_SYRINGE | INTRAVENOUS | Status: DC | PRN
  Administered 2017-08-05: 10 mg via INTRAVENOUS

## 2017-08-05 NOTE — Progress Notes (Signed)
PROGRESS NOTE                                                                                                                                                                                                             Patient Demographics:    Jose [name], is a 62 y.o. male, DOB - April 21, 1956, GDJ:[id number]  Admit date - 08/04/2017   Admitting Physician [name], MD  Outpatient Primary MD for the patient is [name], MD  LOS - 0  Outpatient Specialists: [facility]  Chief Complaint  Patient presents with  . Abnormal Lab       Brief Narrative   63 year old male with decompensated liver disease secondary to ongoing alcohol use and hep C with ascites, esophageal/gastric varices, ongoing alcohol dependence, polysubstance abuse and recently diagnosed enlarging liver mass who presented to IR for paracentesis and liver mass biopsy was found to have significant drop in H&H.  (Hemoglobin of 6.3 from baseline around 9).  Patient also complained of vomiting of brown liquid the previous night. [facility] consulted and recommended admission and further workup.  Stool for Hemoccult was positive. Patient had therapeutic paracentesis with 5 L clear yellow colored fluid removed.   Subjective:   Received 2 unit PRBC.  Feels his abdominal distention to be better.  No nausea or vomiting.   Assessment  & Plan :    Principal Problem:   Acute symptomatic anemia Drop of 3 g hemoglobin from baseline, improved with 2 unit PRBC on admission.  GI involved in care.  IV PPI twice daily.  No octreotide due to absence of varices on recent EGD GI plan on EGD today. EGD done 3 weeks back with few nonbleeding gastric erosions/nonbleeding duodenal ulcer.  No varices. Monitor H&H closely.  Progressive right liver lesion. Recent MRI showing enlarging infiltrative lesion on the right lower liver measuring 4 x 5.5 cm.  Findings are consistent  with progressive HCC.  Patient's AFP was 16 and prior biopsy of this lesion in 2017 was concerning for [facility] but was nondiagnostic. He was scheduled to have liver biopsy yesterday but held due to acute anemia.  Plan on biopsy this admission.   Active Problems:      Alcoholism ([place]) Decompensated liver cirrhosis with ascites ([place]) Had paracentesis with 5 L clear yellow fluid removed.  Still distended with significant  scrotal edema. Continue Lasix and Aldactone.  Continue rifaximin.  Will need repeat large volume paracentesis in 1-2 days. Monitor LFTs.  Patient is quite jaundiced. Patient is not a candidate for liver transplant due to ongoing alcohol use.  Acute kidney injury ([place]) Suspect ATN.  Check urine lites and osmolality.  Check renal ultrasound.  Avoid nephrotoxins.  Thrombocytopenia ([place]) Secondary to decompensated cirrhosis.  Currently stable.  Ongoing alcohol use/polysubstance abuse Counseled strongly on cessation.  Still drinks 2-3 beers daily.  Urine drug screen is positive for cocaine.  No signs of withdrawal or encephalopathy.  Needs repeat large volume paracentesis. As outlined above.  Still distended despite 5 L paracentesis on 4/9.  Started on Lasix and Aldactone.  Right lower lobe nodule As seen on recent MRI of the abdomen.  Cannot obtain CT of the chest with contrast due to renal dysfunction.  If liver biopsy positive may consider PET scan as outpatient.  Hypervolemic hyponatremia Secondary to ascites.  Chronic.  Monitor for now.  Hypokalemia Replenished  Code Status : Full code, prognosis is guarded  Family Communication  : Sister and mother at bedside  Disposition Plan  : Pending hospital course  Barriers For Discharge : Active symptoms  Consults  : GI  Procedures  : Paracentesis, EGD  DVT Prophylaxis  : SCDs  Lab Results  Component Value Date   PLT 99 (L) 08/05/2017    Antibiotics  :   Anti-infectives (From admission, onward)   Start      Dose/Rate Route Frequency Ordered Stop   08/05/17 1400  cefTRIAXone (ROCEPHIN) 1 g in sodium chloride 0.9 % 100 mL IVPB     1 g 200 mL/hr over 30 Minutes Intravenous Every 24 hours 08/04/17 1600     08/04/17 2200  rifaximin (XIFAXAN) tablet 550 mg     550 mg Oral 2 times daily 08/04/17 1600     08/04/17 1415  cefTRIAXone (ROCEPHIN) 1 g in sodium chloride 0.9 % 100 mL IVPB     1 g 200 mL/hr over 30 Minutes Intravenous  Once 08/04/17 1414 08/04/17 1543        Objective:   Vitals:   08/04/17 1903 08/04/17 2039 08/04/17 2220 08/05/17 0510  BP: 103/85 121/76 128/87 102/68  Pulse: 68 60 62 (!) 59  Resp: 16 17 20 16   Temp: 98.2 F (36.8 C) 97.7 F (36.5 C) 98 F (36.7 C) 98.4 F (36.9 C)  TempSrc: Axillary Oral Oral Oral  SpO2: 98% 98% 100% 100%  Weight:      Height:        Wt Readings from Last 3 Encounters:  08/04/17 85.1 kg (187 lb 9.8 oz)  08/04/17 91.2 kg (201 lb)  07/16/17 86.3 kg (190 lb 3.2 oz)     Intake/Output Summary (Last 24 hours) at 08/05/2017 1010 Last data filed at 08/05/2017 0630 Gross per 24 hour  Intake 1530 ml  Output 575 ml  Net 955 ml     Physical Exam  Gen: Appears fatigued HEENT: Pallor present, anicteric, moist mucosa, supple neck Chest: clear b/l, no added sounds CVS: N S1&S2, no murmurs, GI: soft, markedly distended with ascites, protuberant umbilicus, large scrotal edema, nontender Musculoskeletal: warm, 1+ pitting edema bilaterally CNS: Alert and oriented, no tremors    Data Review:    CBC Recent Labs  Lab 08/04/17 1057 08/05/17 0043 08/05/17 0618  WBC 6.1 6.0 5.2  HGB 6.3* 7.7* 7.9*  HCT 18.5* 23.1* 23.4*  PLT 129* 100* 99*  MCV  90.7 88.5 89.3  MCH 30.9 29.5 30.2  MCHC 34.1 33.3 33.8  RDW 17.6* 17.6* 18.7*    Chemistries  Recent Labs  Lab 08/04/17 1859 08/05/17 0618  NA 128* 127*  K 3.3* 3.6  CL 99* 98*  CO2 20* 19*  GLUCOSE 110* 131*  BUN 60* 54*  CREATININE 2.18* 1.95*  CALCIUM 7.7* 7.7*  AST 106*  --     ALT 111*  --   ALKPHOS 144*  --   BILITOT 3.8*  --    ------------------------------------------------------------------------------------------------------------------ No results for input(s): CHOL, HDL, LDLCALC, TRIG, CHOLHDL, LDLDIRECT in the last 72 hours.  Lab Results  Component Value Date   HGBA1C 5.2 07/15/2016   ------------------------------------------------------------------------------------------------------------------ No results for input(s): TSH, T4TOTAL, T3FREE, THYROIDAB in the last 72 hours.  Invalid input(s): FREET3 ------------------------------------------------------------------------------------------------------------------ No results for input(s): VITAMINB12, FOLATE, FERRITIN, TIBC, IRON, RETICCTPCT in the last 72 hours.  Coagulation profile Recent Labs  Lab 08/04/17 1057  INR 1.83    No results for input(s): DDIMER in the last 72 hours.  Cardiac Enzymes No results for input(s): CKMB, TROPONINI, MYOGLOBIN in the last 168 hours.  Invalid input(s): CK ------------------------------------------------------------------------------------------------------------------    Component Value Date/Time   BNP 39.8 07/24/2015 0230    Inpatient Medications  Scheduled Meds: . furosemide  40 mg Oral Daily  . oxyCODONE  5 mg Oral BID  . pantoprazole (PROTONIX) IV  40 mg Intravenous Q12H  . rifaximin  550 mg Oral BID  . spironolactone  50 mg Oral Daily   Continuous Infusions: . cefTRIAXone (ROCEPHIN) IVPB 1 gram/100 mL NS (Mini-Bag Plus)    . lactated ringers 75 mL/hr at 08/05/17 0606   PRN Meds:.acetaminophen **OR** acetaminophen, ondansetron **OR** ondansetron (ZOFRAN) IV  Micro Results No results found for this or any previous visit (from the past 240 hour(s)).  Radiology Reports Ct Head Wo Contrast  Result Date: 07/12/2017 CLINICAL DATA:  Patient with left upper extremity numbness. EXAM: CT HEAD WITHOUT CONTRAST TECHNIQUE: Contiguous axial images  were obtained from the base of the skull through the vertex without intravenous contrast. COMPARISON:  Brain CT 08/14/2016. FINDINGS: Brain: Ventricles and sulci are prominent compatible with atrophy. Periventricular and subcortical white matter hypodensity compatible with chronic microvascular ischemic changes. No evidence for acute cortically based infarct, intracranial hemorrhage, mass lesion or mass-effect. Vascular: Unremarkable. Skull: Intact. Sinuses/Orbits: Paranasal sinuses are well aerated. Mastoid air cells unremarkable. Orbits unremarkable. Chronic deformity of the medial orbital walls bilaterally. Other: None. IMPRESSION: No acute intracranial process. Atrophy and chronic microvascular ischemic changes. Electronically Signed   By: Lovey Newcomer M.D.   On: 07/12/2017 12:46   US Abdomen Complete  Result Date: 07/12/2017 CLINICAL DATA:  Patient with history of hepatitis C, cirrhosis. Hypertension. Elevated LFTs. EXAM: ABDOMEN ULTRASOUND COMPLETE COMPARISON:  Ultrasound 03/27/2017. FINDINGS: Gallbladder: Sludge within the gallbladder lumen. Gallbladder wall thickening measuring up to 3.6 mm. Negative sonographic Murphy's sign. Common bile duct: Diameter: 4 mm Liver: The liver is heterogeneous in echogenicity and nodular in contour. Within the right hepatic lobe there is a 5.9 x 6.5 x 5.2 cm mixed echogenicity mass, previously measuring 4.4 x 3.9 x 4.2 cm. 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 IVC: No abnormality visualized. Pancreas: Visualized portion unremarkable. Spleen: Size and appearance within normal limits. Right Kidney: Length: 11.5 cm. Echogenicity within normal limits. No mass or hydronephrosis visualized. Left Kidney: Length: 12.2 cm. Echogenicity within normal limits. No mass or hydronephrosis visualized. Abdominal aorta: No aneurysm visualized. Other findings: None. IMPRESSION: 1.  Interval increase in size of mixed echogenicity mass within the right hepatic lobe,  potentially representing hepatocellular carcinoma. 2. Stones and sludge within the gallbladder lumen with associated wall thickening. Wall thickening is nonspecific in the setting of cirrhosis. 3. Cirrhosis. 4. 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 Electronically Signed   By: Lovey Newcomer M.D.   On: 07/12/2017 12:42   Mr Liver W WU Contrast  Result Date: 07/21/2017 CLINICAL DATA:  Hepatitis-C.  Cirrhosis.  Hemochromatosis. EXAM: MRI ABDOMEN WITHOUT AND WITH CONTRAST TECHNIQUE: Multiplanar multisequence MR imaging of the abdomen was performed both before and after the administration of intravenous contrast. CONTRAST:  66mL MULTIHANCE GADOBENATE DIMEGLUMINE 529 MG/ML IV SOLN COMPARISON:  07/12/2017 ultrasound.  MRI 08/05/2016. FINDINGS: Moderate motion degradation throughout. Lower chest: New small bilateral pleural effusions. Normal heart size. Right lower lobe pulmonary nodules on the order of 3 mm x 2 are new since the prior MRI and CT. There is probable atelectasis at the left lung base. Periesophageal varices. Hepatobiliary: Cirrhosis. Probable cyst in the left hepatic lobe. Significant enlargement of a partially branching, precontrast T1 hyperintense, arterial phase hyperenhancing, delayed hypoenhancing liver lesion. This may extend minimally into the medial segment left liver lobe. Centered in segments 6 and 7. Superiorly, this measures on the order of 4.0 x 5.5 cm on image [id number]. More inferiorly, contiguous rounded portion measures 4.3 x 4.6 cm on image 53. Gallbladder wall thickening is again identified and nonspecific in the setting of cirrhosis. No biliary duct dilatation. Pancreas:  Grossly normal period without duct dilatation. Spleen:  Presumed siderotic nodules within the medial spleen. Adrenals/Urinary Tract: Normal adrenal glands. Normal kidneys, without hydronephrosis. Stomach/Bowel: Grossly normal stomach and abdominal bowel loops. Vascular/Lymphatic: Portal venous  hypertension, with recannulized periumbilical vein and gastric varices. Patent main portal vein. Right portal vein not well evaluated. No gross abdominal adenopathy. Other:  Increase in moderate volume ascites. Musculoskeletal: No acute osseous abnormality. IMPRESSION: 1. Moderately motion degraded exam. 2. Significant enlargement of a heterogeneous, infiltrative lesion centered in the right lobe of the liver. This is favored to represent progressive hepatocellular carcinoma. Imaging findings are not diagnostic, however given degradation of today's exam and the somewhat unusual lesion morphology. 3. Right portal vein not well evaluated, and tumor involvement cannot be excluded. Liver protocol CT may be informative given limitation of current exam. 4. Cirrhosis and portal venous hypertension with increased ascites and development of small bilateral pleural effusions. 5. New right lower lobe nodularity. If the patient is diagnosed with hepatocellular carcinoma, consider dedicated chest CT for staging. Electronically Signed   By: Abigail Miyamoto M.D.   On: 07/21/2017 10:53   Ir Paracentesis  Result Date: 08/04/2017 INDICATION: Patient with alcoholic cirrhosis, hep C, enlarging liver lesion, ascites. Request is made for therapeutic paracentesis. EXAM: ULTRASOUND GUIDED THERAPEUTIC PARACENTESIS MEDICATIONS: 10 mL 2% lidocaine COMPLICATIONS: None immediate. PROCEDURE: Informed written consent was obtained from the patient after a discussion of the risks, benefits and alternatives to treatment. A timeout was performed prior to the initiation of the procedure. Initial ultrasound scanning demonstrates a large amount of ascites within the right lower abdominal quadrant. The right lower abdomen was prepped and draped in the usual sterile fashion. 2% lidocaine was used for local anesthesia. Following this, a 19 gauge, 7-cm, Yueh catheter was introduced. An ultrasound image was saved for documentation purposes. The paracentesis  was performed. The catheter was removed and a dressing was applied. The patient tolerated the procedure well without immediate post procedural complication. FINDINGS: A  total of approximately 5.0 liters of clear, yellow fluid was removed. IMPRESSION: Successful ultrasound-guided therapeutic paracentesis yielding 5.0 liters of peritoneal fluid. Read by: Brynda Greathouse PA-C Electronically Signed   By: Jerilynn Mages.  Shick M.D.   On: 08/04/2017 13:37    Time Spent in minutes  35   Kofi Murrell M.D on 08/05/2017 at 10:10 AM  Between 7am to 7pm - Pager - [phone]  After 7pm go to [email or web address] - password [facility]  [facility] -  Office  [phone]

## 2017-08-05 NOTE — Op Note (Signed)
[facility] Patient Name: Jose [name] Procedure Date : 08/05/2017 MRN: [id number] Attending MD: Jackquline Denmark MD, MD Date of Birth: Jun 23, 1955 CSN: [id number] Age: 62 Admit Type: Inpatient Procedure:                Upper GI endoscopy Indications:              Recent upper gastrointestinal bleeding in patient                            with advanced decompensated liver cirrhosis with                            ascites and hepatic mass highly suggestive of                            hepatocellular carcinoma. Providers:                Jackquline Denmark MD, MD, Presley Raddle, RN, Charolette Child, Technician Referring MD:              Medicines:                Monitored Anesthesia Care Complications:            No immediate complications. Estimated Blood Loss:     Estimated blood loss: none. Procedure:                Pre-Anesthesia Assessment:                           - Prior to the procedure, a History and Physical                            was performed, and patient medications and                            allergies were reviewed. The patient is competent.                            The risks and benefits of the procedure and the                            sedation options and risks were discussed with the                            patient. All questions were answered and informed                            consent was obtained. Patient identification and                            proposed procedure were verified by the physician                            in the  pre-procedure area in the procedure room.                            Mental Status Examination: alert and oriented.                            Prophylactic Antibiotics: The patient requires                            prophylactic antibiotics due to a prior history of                            acute GI bleeding. Prior Anticoagulants: The                            patient has taken no  previous anticoagulant or                            antiplatelet agents. ASA Grade Assessment: IV - A                            patient with severe systemic disease that is a                            constant threat to life. After reviewing the risks                            and benefits, the patient was deemed in                            satisfactory condition to undergo the procedure.                            The anesthesia plan was to use monitored anesthesia                            care (MAC). Immediately prior to administration of                            medications, the patient was re-assessed for                            adequacy to receive sedatives. The heart rate,                            respiratory rate, oxygen saturations, blood                            pressure, adequacy of pulmonary ventilation, and                            response to care were monitored throughout the  procedure. The physical status of the patient was                            re-assessed after the procedure.                           After obtaining informed consent, the endoscope was                            passed under direct vision. Throughout the                            procedure, the patient's blood pressure, pulse, and                            oxygen saturations were monitored continuously. The                            Endoscope was introduced through the mouth, and                            advanced to the second part of duodenum. The upper                            GI endoscopy was accomplished without difficulty.                            The patient tolerated the procedure well. Scope In: Scope Out: Findings:      The examined esophagus was normal. No esophageal varices.      The entire examined stomach was normal. Biopsies were taken with a cold       forceps for histology for H. Pylori. Estimated blood loss: none. no        active bleeding. No fundal varices      A few localized erosions without bleeding were found in the first       portion of the duodenum. Impression:               - Duodenal erosions without bleeding.                           - No fundal or esophageal varices. No active                            bleeding. Recommendation:           - Return patient to hospital ward for ongoing care.                           - Low sodium fluid restricted diet.                           - Continue present medications.                           - Await pathology results.                           -  Will discuss with IR regarding liver biopsy and                            paracenteses. Procedure Code(s):        --- Professional ---                           [phone], Esophagogastroduodenoscopy, flexible,                            transoral; with biopsy, single or multiple Diagnosis Code(s):        --- Professional ---                           K26.9, Duodenal ulcer, unspecified as acute or                            chronic, without hemorrhage or perforation                           K92.2, Gastrointestinal hemorrhage, unspecified CPT copyright 2017 [employer]. All rights reserved. The codes documented in this report are preliminary and upon coder review may  be revised to meet current compliance requirements. Jackquline Denmark MD, MD 08/05/2017 12:05:04 PM This report has been signed electronically. Number of Addenda: 0

## 2017-08-05 NOTE — Anesthesia Procedure Notes (Signed)
Procedure Name: MAC Date/Time: 08/05/2017 11:50 AM Performed by: Renato Shin, CRNA Pre-anesthesia Checklist: Patient identified, Emergency Drugs available, Suction available and Patient being monitored Patient Re-evaluated:Patient Re-evaluated prior to induction Oxygen Delivery Method: Nasal cannula Preoxygenation: Pre-oxygenation with 100% oxygen Induction Type: IV induction Placement Confirmation: positive ETCO2,  CO2 detector and breath sounds checked- equal and bilateral Dental Injury: Teeth and Oropharynx as per pre-operative assessment

## 2017-08-05 NOTE — Anesthesia Preprocedure Evaluation (Addendum)
Anesthesia Evaluation  Patient identified by MRN, date of birth, ID band Patient awake    Reviewed: Allergy & Precautions, NPO status , Patient's Chart, lab work & pertinent test results  Airway Mallampati: II  TM Distance: >3 FB Neck ROM: Full    Dental no notable dental hx. (+) Edentulous Upper, Poor Dentition   Pulmonary shortness of breath, Current Smoker,    breath sounds clear to auscultation       Cardiovascular hypertension,  Rhythm:Regular Rate:Normal     Neuro/Psych    GI/Hepatic GERD  ,(+) Cirrhosis       , Hepatitis -, Cbleed   Endo/Other    Renal/GU      Musculoskeletal   Abdominal   Peds  Hematology   Anesthesia Other Findings   Reproductive/Obstetrics                            Anesthesia Physical Anesthesia Plan  ASA: IV  Anesthesia Plan: MAC   Post-op Pain Management:    Induction: Intravenous  PONV Risk Score and Plan: 1 and Treatment may vary due to age or medical condition  Airway Management Planned: Natural Airway and Nasal Cannula  Additional Equipment:   Intra-op Plan:   Post-operative Plan:   Informed Consent: I have reviewed the patients History and Physical, chart, labs and discussed the procedure including the risks, benefits and alternatives for the proposed anesthesia with the patient or authorized representative who has indicated his/her understanding and acceptance.   Dental advisory given  Plan Discussed with:   Anesthesia Plan Comments:        Anesthesia Quick Evaluation

## 2017-08-05 NOTE — Discharge Instructions (Signed)
Esophagogastroduodenoscopy, Care After °Refer to this sheet in the next few weeks. These instructions provide you with information about caring for yourself after your procedure. Your health care provider may also give you more specific instructions. Your treatment has been planned according to current medical practices, but problems sometimes occur. Call your health care provider if you have any problems or questions after your procedure. °What can I expect after the procedure? °After the procedure, it is common to have: °· A sore throat. °· Nausea. °· Bloating. °· Dizziness. °· Fatigue. ° °Follow these instructions at home: °· Do not eat or drink anything until the numbing medicine (local anesthetic) has worn off and your gag reflex has returned. You will know that the local anesthetic has worn off when you can swallow comfortably. °· Do not drive for 24 hours if you received a medicine to help you relax (sedative). °· If your health care provider took a tissue sample for testing during the procedure, make sure to get your test results. This is your responsibility. Ask your health care provider or the department performing the test when your results will be ready. °· Keep all follow-up visits as told by your health care provider. This is important. °Contact a health care provider if: °· You cannot stop coughing. °· You are not urinating. °· You are urinating less than usual. °Get help right away if: °· You have trouble swallowing. °· You cannot eat or drink. °· You have throat or chest pain that gets worse. °· You are dizzy or light-headed. °· You faint. °· You have nausea or vomiting. °· You have chills. °· You have a fever. °· You have severe abdominal pain. °· You have black, tarry, or bloody stools. °This information is not intended to replace advice given to you by your health care provider. Make sure you discuss any questions you have with your health care provider. °Document Released: 03/31/2012 Document  Revised: 09/20/2015 Document Reviewed: 03/08/2015 °Elsevier Interactive Patient Education © 2018 [employer] ° °

## 2017-08-05 NOTE — Transfer of Care (Signed)
Immediate Anesthesia Transfer of Care Note  Patient: Jose [name]  Procedure(s) Performed: ESOPHAGOGASTRODUODENOSCOPY (EGD) (N/A )  Patient Location: Endoscopy Unit  Anesthesia Type:MAC  Level of Consciousness: awake, sedated and patient cooperative  Airway & Oxygen Therapy: Patient Spontanous Breathing and Patient connected to nasal cannula oxygen  Post-op Assessment: Report given to RN and Post -op Vital signs reviewed and stable  Post vital signs: Reviewed and stable  Last Vitals:  Vitals Value Taken Time  BP    Temp    Pulse    Resp    SpO2      Last Pain:  Vitals:   08/05/17 1107  TempSrc: Oral  PainSc: 0-No pain      Patients Stated Pain Goal: 2 (02/98/47 3085)  Complications: No apparent anesthesia complications

## 2017-08-05 NOTE — Progress Notes (Signed)
   08/05/17 8841  Clinical Encounter Type  Visited With Patient and family together  Visit Type Initial;Spiritual support  Referral From Nurse  Consult/Referral To Chaplain  Spiritual Encounters  Spiritual Needs Prayer  Stress Factors  Patient Stress Factors  (Waiting for procedure hoping it can happen today.)   Responded to a SCC for prayer.  Notes indicated patient was suppose to have procedure yesterday, but delayed until today, hopefully, as the patient stated.  Patient was sitting up in the bed and family members at bedside.  Family is hopeful he can have procedure today.  He was a little anxious but state he is feeling better.  We prayed together.  Will follow and support as needed. Chaplain Katherene Ponto

## 2017-08-05 NOTE — Anesthesia Postprocedure Evaluation (Signed)
Anesthesia Post Note  Patient: Jose [name]  Procedure(s) Performed: ESOPHAGOGASTRODUODENOSCOPY (EGD) (N/A )     Patient location during evaluation: Endoscopy Anesthesia Type: MAC Level of consciousness: awake and alert Pain management: pain level controlled Vital Signs Assessment: post-procedure vital signs reviewed and stable Respiratory status: spontaneous breathing, nonlabored ventilation, respiratory function stable and patient connected to nasal cannula oxygen Cardiovascular status: stable and blood pressure returned to baseline Postop Assessment: no apparent nausea or vomiting Anesthetic complications: no    Last Vitals:  Vitals:   08/05/17 1220 08/05/17 1323  BP: 108/62 126/76  Pulse: (!) 57 (!) 57  Resp: 10   Temp:  36.6 C  SpO2: 100% 99%    Last Pain:  Vitals:   08/05/17 1323  TempSrc: Oral  PainSc:                  Elleanna Melling,JAMES TERRILL

## 2017-08-05 NOTE — Interval H&P Note (Signed)
History and Physical Interval Note:  08/05/2017 11:35 AM  Jose [name]  has presented today for surgery, with the diagnosis of anemia, GI bleeding  The various methods of treatment have been discussed with the patient and family. After consideration of risks, benefits and other options for treatment, the patient has consented to  Procedure(s): ESOPHAGOGASTRODUODENOSCOPY (EGD) (N/A) as a surgical intervention .  The patient's history has been reviewed, patient examined, no change in status, stable for surgery.  I have reviewed the patient's chart and labs.  Questions were answered to the patient's satisfaction.     Jackquline Denmark

## 2017-08-06 ENCOUNTER — Encounter (HOSPITAL_COMMUNITY): Payer: Self-pay | Admitting: Gastroenterology

## 2017-08-06 DIAGNOSIS — K7460 Unspecified cirrhosis of liver: Secondary | ICD-10-CM

## 2017-08-06 DIAGNOSIS — K269 Duodenal ulcer, unspecified as acute or chronic, without hemorrhage or perforation: Secondary | ICD-10-CM

## 2017-08-06 DIAGNOSIS — K7290 Hepatic failure, unspecified without coma: Secondary | ICD-10-CM

## 2017-08-06 DIAGNOSIS — E876 Hypokalemia: Secondary | ICD-10-CM

## 2017-08-06 LAB — COMPREHENSIVE METABOLIC PANEL
ALBUMIN: 2 g/dL — AB (ref 3.5–5.0)
ALK PHOS: 158 U/L — AB (ref 38–126)
ALT: 82 U/L — ABNORMAL HIGH (ref 17–63)
ANION GAP: 8 (ref 5–15)
AST: 76 U/L — ABNORMAL HIGH (ref 15–41)
BILIRUBIN TOTAL: 3 mg/dL — AB (ref 0.3–1.2)
BUN: 46 mg/dL — ABNORMAL HIGH (ref 6–20)
CALCIUM: 7.4 mg/dL — AB (ref 8.9–10.3)
CO2: 21 mmol/L — ABNORMAL LOW (ref 22–32)
Chloride: 99 mmol/L — ABNORMAL LOW (ref 101–111)
Creatinine, Ser: 2.06 mg/dL — ABNORMAL HIGH (ref 0.61–1.24)
GFR calc non Af Amer: 33 mL/min — ABNORMAL LOW (ref >60)
GFR, EST AFRICAN AMERICAN: 38 mL/min — AB (ref >60)
GLUCOSE: 146 mg/dL — AB (ref 65–99)
POTASSIUM: 3.4 mmol/L — AB (ref 3.5–5.1)
Sodium: 128 mmol/L — ABNORMAL LOW (ref 135–145)
TOTAL PROTEIN: 5 g/dL — AB (ref 6.5–8.1)

## 2017-08-06 LAB — CBC
HEMATOCRIT: 23.7 % — AB (ref 39.0–52.0)
Hemoglobin: 8 g/dL — ABNORMAL LOW (ref 13.0–17.0)
MCH: 30 pg (ref 26.0–34.0)
MCHC: 33.8 g/dL (ref 30.0–36.0)
MCV: 88.8 fL (ref 78.0–100.0)
Platelets: 102 10*3/uL — ABNORMAL LOW (ref 150–400)
RBC: 2.67 MIL/uL — ABNORMAL LOW (ref 4.22–5.81)
RDW: 18.5 % — AB (ref 11.5–15.5)
WBC: 5.7 10*3/uL (ref 4.0–10.5)

## 2017-08-06 MED ORDER — ALBUMIN HUMAN 25 % IV SOLN
25.0000 g | Freq: Two times a day (BID) | INTRAVENOUS | Status: AC
  Administered 2017-08-06 – 2017-08-07 (×4): 25 g via INTRAVENOUS
  Filled 2017-08-06 (×2): qty 50
  Filled 2017-08-06 (×2): qty 100
  Filled 2017-08-06 (×2): qty 50

## 2017-08-06 MED ORDER — PANTOPRAZOLE SODIUM 40 MG PO TBEC
40.0000 mg | DELAYED_RELEASE_TABLET | Freq: Two times a day (BID) | ORAL | Status: DC
  Administered 2017-08-06 – 2017-08-08 (×4): 40 mg via ORAL
  Filled 2017-08-06 (×4): qty 1

## 2017-08-06 MED ORDER — PANTOPRAZOLE SODIUM 40 MG PO TBEC: 40.0000 mg | DELAYED_RELEASE_TABLET | Freq: Two times a day (BID) | ORAL | Status: DC

## 2017-08-06 MED ORDER — POTASSIUM CHLORIDE CRYS ER 20 MEQ PO TBCR
40.0000 meq | EXTENDED_RELEASE_TABLET | Freq: Once | ORAL | Status: AC
  Administered 2017-08-06: 40 meq via ORAL
  Filled 2017-08-06: qty 2

## 2017-08-06 MED ORDER — SODIUM CHLORIDE 0.9 % IV SOLN
INTRAVENOUS | Status: DC
  Administered 2017-08-06: 10 mL/h via INTRAVENOUS

## 2017-08-06 NOTE — Care Management Note (Signed)
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 Lives alone.     -s/p paracentesis  on 4/9     - liver biopsy on 4/9 but was held due to acute anemia.... inpatient liver biopsy pending.          [name] (Sister) [place] ([phone]     PCP: Dr: [name]   Action/Plan: Liver bx pending ... GI following....NCM monitoring for transitional care needs.  Expected Discharge Date:                  Expected Discharge Plan:  Home/Self Care  In-House Referral:     Discharge planning Services  CM Consult  Post Acute Care Choice:    Choice offered to:     DME Arranged:    DME Agency:     HH Arranged:    HH Agency:     Status of Service:  In process, will continue to follow  If discussed at Long Length of Stay Meetings, dates discussed:    Additional Comments:  Sharin Mons, RN 08/06/2017, 3:09 PM

## 2017-08-06 NOTE — Progress Notes (Signed)
IR called and doesn't want to proceed with liver biopsy, feels patient too high risk. I went a head and consulted Oncology since this is probably [facility]. Spoke with Dr. Alen Blew, nothing to do as inpatient but he will have office arrange for outpatient evaluation.

## 2017-08-06 NOTE — Progress Notes (Signed)
Patient ID: Jose [name], male   DOB: [birth year], 62 y.o.   MRN: [id number] IR was consulted about getting a biopsy of the liver lesion.  I discussed liver biopsy with Dr. Loletha Carrow of GI.  Patient is high risk for bleeding with percutaneous biopsy of this liver lesion due to the large volume of ascites and abnormal labs.  We discussed a transjugular liver biopsy but that is a random biopsy and there is a chance that we would not biopsy the lesion itself.  Based on patient's current situation, it's not clear that a malignancy diagnosis would change his treatment.  Therefore, we have decided to hold off on a liver biopsy until patient can be assessed by oncology.

## 2017-08-06 NOTE — Progress Notes (Signed)
PROGRESS NOTE                                                                                                                                                                                                             Patient Demographics:    Jose [name], is a 62 y.o. male, DOB - [birth year], EVO:[id number]  Admit date - 08/04/2017   Admitting Physician [name], MD  Outpatient Primary MD for the patient is [name], MD  LOS - 1  Outpatient Specialists: [facility]  Chief Complaint  Patient presents with  . Abnormal Lab       Brief Narrative   62 year old male with decompensated liver disease secondary to ongoing alcohol use and hep C with ascites, esophageal/gastric varices, ongoing alcohol dependence, polysubstance abuse and recently diagnosed enlarging liver mass who presented to IR for paracentesis and liver mass biopsy was found to have significant drop in H&H.  (Hemoglobin of 6.3 from baseline around 9).  Patient also complained of vomiting of brown liquid the previous night. [facility] consulted and recommended admission and further workup.  Stool for Hemoccult was positive. Patient had therapeutic paracentesis with 5 L clear yellow colored fluid removed.   Subjective:   H&H stable after 2 unit PRBC.  Still has abdominal distention.  Denies pain.   Assessment  & Plan :    Principal Problem:   Acute symptomatic anemia Drop of 3 g hemoglobin from baseline, improved with 2 unit PRBC .   EGD repeated showing nonbleeding duodenal erosions, no bleeding varices. Monitor H&H closely.  Progressive right liver lesion. Recent MRI showing enlarging infiltrative lesion on the right lower liver measuring 4 x 5.5 cm.  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  Patient's AFP was 16 and prior biopsy of this lesion in 2017 was concerning for [facility] but was nondiagnostic. Had scheduled liver biopsy on 4/9 but was  held due to acute anemia.  I have consulted for inpatient liver biopsy.   Active Problems:      Alcoholism ([place]) Decompensated liver cirrhosis with ascites (HCC) paracentesis with 5 L clear yellow fluid removed on 4/9.Marland Kitchen  Still distended with significant scrotal edema. Added Lasix and Aldactone.  Continue rifaximin.  Repeat large volume paracentesis today.  Ordered four doses of IV albumin.  Send fluid for cell count and cytology.  Monitor  LFTs.  Patient is quite jaundiced. Patient is not a candidate for liver transplant due to ongoing alcohol use.  Acute kidney injury ([place]) Suspect ATN versus hepatorenal syndrome.Marland Kitchen  Appears oliguric.  Urine sodium and osmolality normal. Renal ultrasound without acute findings.  Avoid nephrotoxins.   Continue to monitor with Lasix, Aldactone and venous tachycardia.  If no improvement will need nephrology consult.   Thrombocytopenia ([place]) Secondary to decompensated cirrhosis.  Currently stable.  Ongoing alcohol use/polysubstance abuse Counseled strongly on cessation.  Still drinks 2-3 beers daily.  Urine drug screen is positive for cocaine.  No signs of withdrawal or encephalopathy. .  Right lower lobe nodule As seen on recent MRI of the abdomen.  Cannot obtain CT of the chest with contrast due to renal dysfunction.  If liver biopsy positive may consider PET scan as outpatient.  Hypervolemic hyponatremia Secondary to ascites.  Chronic.  Monitor for now.  Hypokalemia Replenished  Code Status : Full code, prognosis is guarded  Family Communication  : None at bedside today  Disposition Plan  : Pending hospital course  Barriers For Discharge : Active symptoms  Consults  : GI  Procedures  : Paracentesis, EGD, liver biopsy  DVT Prophylaxis  : SCDs  Lab Results  Component Value Date   PLT 102 (L) 08/06/2017    Antibiotics  :   Anti-infectives (From admission, onward)   Start     Dose/Rate Route Frequency Ordered Stop   08/05/17 1400   cefTRIAXone (ROCEPHIN) 1 g in sodium chloride 0.9 % 100 mL IVPB     1 g 200 mL/hr over 30 Minutes Intravenous Every 24 hours 08/04/17 1600     08/04/17 2200  rifaximin (XIFAXAN) tablet 550 mg     550 mg Oral 2 times daily 08/04/17 1600     08/04/17 1415  cefTRIAXone (ROCEPHIN) 1 g in sodium chloride 0.9 % 100 mL IVPB     1 g 200 mL/hr over 30 Minutes Intravenous  Once 08/04/17 1414 08/04/17 1543        Objective:   Vitals:   08/05/17 1220 08/05/17 1323 08/05/17 2300 08/06/17 0450  BP: 108/62 126/76 117/87 110/75  Pulse: (!) 57 (!) 57 70 66  Resp: 10 16 17 17   Temp:  97.8 F (36.6 C) 97.7 F (36.5 C) 97.8 F (36.6 C)  TempSrc:  Oral Oral Oral  SpO2: 100% 99% 99% 99%  Weight:      Height:        Wt Readings from Last 3 Encounters:  08/04/17 85.1 kg (187 lb 9.8 oz)  08/04/17 91.2 kg (201 lb)  07/16/17 86.3 kg (190 lb 3.2 oz)     Intake/Output Summary (Last 24 hours) at 08/06/2017 1218 Last data filed at 08/06/2017 0950 Gross per 24 hour  Intake 1360 ml  Output 375 ml  Net 985 ml     Physical Exam General: Not in distress, fatigued HEENT: Icteric, pallor present, moist mucosa, supple neck Chest: Clear bilaterally CVs: Normal S1-S2, no murmurs GI: Markedly distended, nontender, protuberant umbilicus with large scrotal edema Musculoskeletal: 1+ pitting edema bilaterally CNS: Alert and oriented, no tremors     Data Review:    CBC Recent Labs  Lab 08/04/17 1057 08/05/17 0043 08/05/17 0618 08/06/17 0445  WBC 6.1 6.0 5.2 5.7  HGB 6.3* 7.7* 7.9* 8.0*  HCT 18.5* 23.1* 23.4* 23.7*  PLT 129* 100* 99* 102*  MCV 90.7 88.5 89.3 88.8  MCH 30.9 29.5 30.2 30.0  MCHC 34.1 33.3 33.8  33.8  RDW 17.6* 17.6* 18.7* 18.5*    Chemistries  Recent Labs  Lab 08/04/17 1859 08/05/17 0618 08/06/17 0445  NA 128* 127* 128*  K 3.3* 3.6 3.4*  CL 99* 98* 99*  CO2 20* 19* 21*  GLUCOSE 110* 131* 146*  BUN 60* 54* 46*  CREATININE 2.18* 1.95* 2.06*  CALCIUM 7.7* 7.7* 7.4*    AST 106*  --  76*  ALT 111*  --  82*  ALKPHOS 144*  --  158*  BILITOT 3.8*  --  3.0*   ------------------------------------------------------------------------------------------------------------------ No results for input(s): CHOL, HDL, LDLCALC, TRIG, CHOLHDL, LDLDIRECT in the last 72 hours.  Lab Results  Component Value Date   HGBA1C 5.2 07/15/2016   ------------------------------------------------------------------------------------------------------------------ No results for input(s): TSH, T4TOTAL, T3FREE, THYROIDAB in the last 72 hours.  Invalid input(s): FREET3 ------------------------------------------------------------------------------------------------------------------ No results for input(s): VITAMINB12, FOLATE, FERRITIN, TIBC, IRON, RETICCTPCT in the last 72 hours.  Coagulation profile Recent Labs  Lab 08/04/17 1057  INR 1.83    No results for input(s): DDIMER in the last 72 hours.  Cardiac Enzymes No results for input(s): CKMB, TROPONINI, MYOGLOBIN in the last 168 hours.  Invalid input(s): CK ------------------------------------------------------------------------------------------------------------------    Component Value Date/Time   BNP 39.8 07/24/2015 0230    Inpatient Medications  Scheduled Meds: . furosemide  40 mg Oral Daily  . oxyCODONE  5 mg Oral BID  . pantoprazole (PROTONIX) IV  40 mg Intravenous Q12H  . rifaximin  550 mg Oral BID  . spironolactone  50 mg Oral Daily   Continuous Infusions: . albumin human    . cefTRIAXone (ROCEPHIN) IVPB 1 gram/100 mL NS (Mini-Bag Plus) Stopped (08/05/17 1330)  . lactated ringers 75 mL/hr at 08/06/17 0310   PRN Meds:.acetaminophen **OR** acetaminophen, ondansetron **OR** ondansetron (ZOFRAN) IV  Micro Results No results found for this or any previous visit (from the past 240 hour(s)).  Radiology Reports Ct Head Wo Contrast  Result Date: 07/12/2017 CLINICAL DATA:  Patient with left upper extremity  numbness. EXAM: CT HEAD WITHOUT CONTRAST TECHNIQUE: Contiguous axial images were obtained from the base of the skull through the vertex without intravenous contrast. COMPARISON:  Brain CT 08/14/2016. FINDINGS: Brain: Ventricles and sulci are prominent compatible with atrophy. Periventricular and subcortical white matter hypodensity compatible with chronic microvascular ischemic changes. No evidence for acute cortically based infarct, intracranial hemorrhage, mass lesion or mass-effect. Vascular: Unremarkable. Skull: Intact. Sinuses/Orbits: Paranasal sinuses are well aerated. Mastoid air cells unremarkable. Orbits unremarkable. Chronic deformity of the medial orbital walls bilaterally. Other: None. IMPRESSION: No acute intracranial process. Atrophy and chronic microvascular ischemic changes. Electronically Signed   By: Lovey Newcomer M.D.   On: 07/12/2017 12:46   US Abdomen Complete  Result Date: 07/12/2017 CLINICAL DATA:  Patient with history of hepatitis C, cirrhosis. Hypertension. Elevated LFTs. EXAM: ABDOMEN ULTRASOUND COMPLETE COMPARISON:  Ultrasound 03/27/2017. FINDINGS: Gallbladder: Sludge within the gallbladder lumen. Gallbladder wall thickening measuring up to 3.6 mm. Negative sonographic Murphy's sign. Common bile duct: Diameter: 4 mm Liver: The liver is heterogeneous in echogenicity and nodular in contour. Within the right hepatic lobe there is a 5.9 x 6.5 x 5.2 cm mixed echogenicity mass, previously measuring 4.4 x 3.9 x 4.2 cm. 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 IVC: No abnormality visualized. Pancreas: Visualized portion unremarkable. Spleen: Size and appearance within normal limits. Right Kidney: Length: 11.5 cm. Echogenicity within normal limits. No mass or hydronephrosis visualized. Left Kidney: Length: 12.2 cm. Echogenicity within normal limits. No mass or hydronephrosis visualized.  Abdominal aorta: No aneurysm visualized. Other findings: None. IMPRESSION: 1. Interval increase  in size of mixed echogenicity mass within the right hepatic lobe, potentially representing hepatocellular carcinoma. 2. Stones and sludge within the gallbladder lumen with associated wall thickening. Wall thickening is nonspecific in the setting of cirrhosis. 3. Cirrhosis. 4. 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 Electronically Signed   By: Lovey Newcomer M.D.   On: 07/12/2017 12:42   US Scrotum  Result Date: 08/05/2017 CLINICAL DATA:  Scrotal edema EXAM: ULTRASOUND OF SCROTUM TECHNIQUE: Complete ultrasound examination of the testicles, epididymis, and other scrotal structures was performed. COMPARISON:  CT 05/20/2016, ultrasound scrotum 05/20/2016 FINDINGS: Right testicle Measurements: 4.6 x 1.2 x 2.7 cm. No mass or microlithiasis visualized. Displaced anteriorly by large scrotal fluid collection. Left testicle Measurements: 4.4 x 2.4 x 1.8 cm. No mass or microlithiasis visualized. Right epididymis:  Normal in size and appearance. Left epididymis:  Normal in size and appearance. Hydrocele: Very large septated, mildly complex right hydrocele. Fluid in the right scrotum measures at least 20.7 cm. Varicocele:  Small bilateral varicoceles. IMPRESSION: 1. Large septated/slightly complex right scrotal fluid collection measuring at least 20.7 cm. This is increased compared to the prior ultrasound. No definite herniated bowel loops at the time of imaging. 2. Bilateral varicoceles Electronically Signed   By: Donavan Foil M.D.   On: 08/05/2017 16:16   Mr Liver W XT Contrast  Result Date: 07/21/2017 CLINICAL DATA:  Hepatitis-C.  Cirrhosis.  Hemochromatosis. EXAM: MRI ABDOMEN WITHOUT AND WITH CONTRAST TECHNIQUE: Multiplanar multisequence MR imaging of the abdomen was performed both before and after the administration of intravenous contrast. CONTRAST:  64mL MULTIHANCE GADOBENATE DIMEGLUMINE 529 MG/ML IV SOLN COMPARISON:  07/12/2017 ultrasound.  MRI 08/05/2016. FINDINGS: Moderate motion degradation  throughout. Lower chest: New small bilateral pleural effusions. Normal heart size. Right lower lobe pulmonary nodules on the order of 3 mm x 2 are new since the prior MRI and CT. There is probable atelectasis at the left lung base. Periesophageal varices. Hepatobiliary: Cirrhosis. Probable cyst in the left hepatic lobe. Significant enlargement of a partially branching, precontrast T1 hyperintense, arterial phase hyperenhancing, delayed hypoenhancing liver lesion. This may extend minimally into the medial segment left liver lobe. Centered in segments 6 and 7. Superiorly, this measures on the order of 4.0 x 5.5 cm on image [id number]. More inferiorly, contiguous rounded portion measures 4.3 x 4.6 cm on image 53. Gallbladder wall thickening is again identified and nonspecific in the setting of cirrhosis. No biliary duct dilatation. Pancreas:  Grossly normal period without duct dilatation. Spleen:  Presumed siderotic nodules within the medial spleen. Adrenals/Urinary Tract: Normal adrenal glands. Normal kidneys, without hydronephrosis. Stomach/Bowel: Grossly normal stomach and abdominal bowel loops. Vascular/Lymphatic: Portal venous hypertension, with recannulized periumbilical vein and gastric varices. Patent main portal vein. Right portal vein not well evaluated. No gross abdominal adenopathy. Other:  Increase in moderate volume ascites. Musculoskeletal: No acute osseous abnormality. IMPRESSION: 1. Moderately motion degraded exam. 2. Significant enlargement of a heterogeneous, infiltrative lesion centered in the right lobe of the liver. This is favored to represent progressive hepatocellular carcinoma. Imaging findings are not diagnostic, however given degradation of today's exam and the somewhat unusual lesion morphology. 3. Right portal vein not well evaluated, and tumor involvement cannot be excluded. Liver protocol CT may be informative given limitation of current exam. 4. Cirrhosis and portal venous hypertension  with increased ascites and development of small bilateral pleural effusions. 5. New right lower lobe nodularity. If the patient is  diagnosed with hepatocellular carcinoma, consider dedicated chest CT for staging. Electronically Signed   By: Abigail Miyamoto M.D.   On: 07/21/2017 10:53   US Renal  Result Date: 08/05/2017 CLINICAL DATA:  Acute kidney injury, hypertension EXAM: RENAL / URINARY TRACT ULTRASOUND COMPLETE COMPARISON:  Abdomen complete ultrasound 07/12/2017 FINDINGS: Right Kidney: Length: 12.4 cm. Normal cortical thickness. Upper normal cortical echogenicity. No mass, hydronephrosis or shadowing calcification. Left Kidney: Length: 11.6 cm. Normal cortical thickness. Upper normal cortical echogenicity. No mass, hydronephrosis or shadowing calcification. Bladder: Bladder unremarkable.  Prostate gland minimally prominent. Moderate ascites. IMPRESSION: No definite urinary tract abnormalities. Moderate ascites. Electronically Signed   By: Lavonia Dana M.D.   On: 08/05/2017 15:17   Ir Paracentesis  Result Date: 08/04/2017 INDICATION: Patient with alcoholic cirrhosis, hep C, enlarging liver lesion, ascites. Request is made for therapeutic paracentesis. EXAM: ULTRASOUND GUIDED THERAPEUTIC PARACENTESIS MEDICATIONS: 10 mL 2% lidocaine COMPLICATIONS: None immediate. PROCEDURE: Informed written consent was obtained from the patient after a discussion of the risks, benefits and alternatives to treatment. A timeout was performed prior to the initiation of the procedure. Initial ultrasound scanning demonstrates a large amount of ascites within the right lower abdominal quadrant. The right lower abdomen was prepped and draped in the usual sterile fashion. 2% lidocaine was used for local anesthesia. Following this, a 19 gauge, 7-cm, Yueh catheter was introduced. An ultrasound image was saved for documentation purposes. The paracentesis was performed. The catheter was removed and a dressing was applied. The patient  tolerated the procedure well without immediate post procedural complication. FINDINGS: A total of approximately 5.0 liters of clear, yellow fluid was removed. IMPRESSION: Successful ultrasound-guided therapeutic paracentesis yielding 5.0 liters of peritoneal fluid. Read by: Brynda Greathouse PA-C Electronically Signed   By: Jerilynn Mages.  Shick M.D.   On: 08/04/2017 13:37    Time Spent in minutes  25   Graylyn Bunney M.D on 08/06/2017 at 12:18 PM  Between 7am to 7pm - Pager - [phone]  After 7pm go to [email or web address] - password [facility]  [facility] -  Office  [phone]

## 2017-08-06 NOTE — Progress Notes (Signed)
Patient ID: Jose [name], male   DOB: Jun 29, 1955, 62 y.o.   MRN: [id number]    Progress Note   Subjective  Denies having any significant complaints currently   Objective   Vital signs in last 24 hours: Temp:  [97.7 F (36.5 C)-97.9 F (36.6 C)] 97.9 F (36.6 C) (04/11 1410) Pulse Rate:  [61-70] 61 (04/11 1410) Resp:  [17-20] 20 (04/11 1410) BP: (110-121)/(75-87) 121/81 (04/11 1410) SpO2:  [99 %-100 %] 100 % (04/11 1410) Last BM Date: 08/05/17 General:    white male in NAD Heart:  Regular rate and rhythm; no murmurs Lungs: Respirations even and unlabored, lungs CTA bilaterally Abdomen: Distended, tense ascites with umbilical hernia and hydroceles Extremities:  Without edema. Neurologic:  Alert and oriented,  grossly normal neurologically. Psych:  Cooperative. Normal mood and affect.  Intake/Output from previous day: 04/10 0701 - 04/11 0700 In: 1440 [P.O.:240; I.V.:1100; IV Piggyback:100] Out: 175 [Urine:175] Intake/Output this shift: Total I/O In: 240 [P.O.:240] Out: 500 [Urine:500]  Lab Results: Recent Labs    08/05/17 0043 08/05/17 0618 08/06/17 0445  WBC 6.0 5.2 5.7  HGB 7.7* 7.9* 8.0*  HCT 23.1* 23.4* 23.7*  PLT 100* 99* 102*   BMET Recent Labs    08/04/17 1859 08/05/17 0618 08/06/17 0445  NA 128* 127* 128*  K 3.3* 3.6 3.4*  CL 99* 98* 99*  CO2 20* 19* 21*  GLUCOSE 110* 131* 146*  BUN 60* 54* 46*  CREATININE 2.18* 1.95* 2.06*  CALCIUM 7.7* 7.7* 7.4*   LFT Recent Labs    08/06/17 0445  PROT 5.0*  ALBUMIN 2.0*  AST 76*  ALT 82*  ALKPHOS 158*  BILITOT 3.0*   PT/INR Recent Labs    08/04/17 1057  LABPROT 21.0*  INR 1.83    Studies/Results: US Scrotum  Result Date: 08/05/2017 CLINICAL DATA:  Scrotal edema EXAM: ULTRASOUND OF SCROTUM TECHNIQUE: Complete ultrasound examination of the testicles, epididymis, and other scrotal structures was performed. COMPARISON:  CT 05/20/2016, ultrasound scrotum 05/20/2016 FINDINGS: Right testicle  Measurements: 4.6 x 1.2 x 2.7 cm. No mass or microlithiasis visualized. Displaced anteriorly by large scrotal fluid collection. Left testicle Measurements: 4.4 x 2.4 x 1.8 cm. No mass or microlithiasis visualized. Right epididymis:  Normal in size and appearance. Left epididymis:  Normal in size and appearance. Hydrocele: Very large septated, mildly complex right hydrocele. Fluid in the right scrotum measures at least 20.7 cm. Varicocele:  Small bilateral varicoceles. IMPRESSION: 1. Large septated/slightly complex right scrotal fluid collection measuring at least 20.7 cm. This is increased compared to the prior ultrasound. No definite herniated bowel loops at the time of imaging. 2. Bilateral varicoceles Electronically Signed   By: Donavan Foil M.D.   On: 08/05/2017 16:16   US Renal  Result Date: 08/05/2017 CLINICAL DATA:  Acute kidney injury, hypertension EXAM: RENAL / URINARY TRACT ULTRASOUND COMPLETE COMPARISON:  Abdomen complete ultrasound 07/12/2017 FINDINGS: Right Kidney: Length: 12.4 cm. Normal cortical thickness. Upper normal cortical echogenicity. No mass, hydronephrosis or shadowing calcification. Left Kidney: Length: 11.6 cm. Normal cortical thickness. Upper normal cortical echogenicity. No mass, hydronephrosis or shadowing calcification. Bladder: Bladder unremarkable.  Prostate gland minimally prominent. Moderate ascites. IMPRESSION: No definite urinary tract abnormalities. Moderate ascites. Electronically Signed   By: Lavonia Dana M.D.   On: 08/05/2017 15:17       Assessment / Plan:    62 year old with decompensated end-stage liver cirrhosis with portal hypertension due to hepatitis C and alcohol with upper GI bleeding due to duodenal erosions (  no varices) on EGD this admission, status post therapeutic paracentesis (5 lit), history of hepatic encephalopathy,  jaundice, coagulopathy,  ascites, hyponatremia, thrombocytopenia, enlarging right lobe liver lesion highly suggestive of hepatocellular  carcinoma (AFP 16.7 06/2017). Not a candidate for liver transplantation due to history of alcohol abuse (last alcohol use a month ago).  Has early hepatorenal syndrome.  Patient with very poor likely fatal prognosis.  Plan: -Salt and fluid restricted diet. -Continue diuretics.  -Continue albumin. -Oncology consultation Nevin Bloodgood talked to oncology, they will see him as outpatient).  If any treatment can be offered for [facility].  Patient aware of diagnosis and poor prognosis. -Palliative care consultation. -Not a candidate for liver biopsy as per IR. -Would need therapeutic paracentesis again since he has tense ascites.  Please send fluid for cytology, cell count, albumin. -Trend CBC, CMP

## 2017-08-07 ENCOUNTER — Encounter (HOSPITAL_COMMUNITY): Payer: Self-pay | Admitting: Radiology

## 2017-08-07 ENCOUNTER — Inpatient Hospital Stay (HOSPITAL_COMMUNITY): Payer: Medicaid Other

## 2017-08-07 DIAGNOSIS — N179 Acute kidney failure, unspecified: Secondary | ICD-10-CM

## 2017-08-07 HISTORY — PX: IR PARACENTESIS: IMG2679

## 2017-08-07 LAB — BODY FLUID CELL COUNT WITH DIFFERENTIAL
EOS FL: 0 %
LYMPHS FL: 47 %
MONOCYTE-MACROPHAGE-SEROUS FLUID: 45 % — AB (ref 50–90)
Neutrophil Count, Fluid: 8 % (ref 0–25)
Total Nucleated Cell Count, Fluid: 78 cu mm (ref 0–1000)

## 2017-08-07 LAB — CBC
HCT: 22.4 % — ABNORMAL LOW (ref 39.0–52.0)
Hemoglobin: 7.5 g/dL — ABNORMAL LOW (ref 13.0–17.0)
MCH: 29.8 pg (ref 26.0–34.0)
MCHC: 33.5 g/dL (ref 30.0–36.0)
MCV: 88.9 fL (ref 78.0–100.0)
PLATELETS: 90 10*3/uL — AB (ref 150–400)
RBC: 2.52 MIL/uL — AB (ref 4.22–5.81)
RDW: 18.3 % — ABNORMAL HIGH (ref 11.5–15.5)
WBC: 4.6 10*3/uL (ref 4.0–10.5)

## 2017-08-07 LAB — COMPREHENSIVE METABOLIC PANEL
ALBUMIN: 2.5 g/dL — AB (ref 3.5–5.0)
ALK PHOS: 161 U/L — AB (ref 38–126)
ALT: 65 U/L — AB (ref 17–63)
AST: 68 U/L — AB (ref 15–41)
Anion gap: 9 (ref 5–15)
BUN: 41 mg/dL — ABNORMAL HIGH (ref 6–20)
CALCIUM: 7.8 mg/dL — AB (ref 8.9–10.3)
CHLORIDE: 99 mmol/L — AB (ref 101–111)
CO2: 22 mmol/L (ref 22–32)
CREATININE: 2.02 mg/dL — AB (ref 0.61–1.24)
GFR calc non Af Amer: 34 mL/min — ABNORMAL LOW (ref >60)
GFR, EST AFRICAN AMERICAN: 39 mL/min — AB (ref >60)
GLUCOSE: 123 mg/dL — AB (ref 65–99)
Potassium: 3.5 mmol/L (ref 3.5–5.1)
SODIUM: 130 mmol/L — AB (ref 135–145)
Total Bilirubin: 2.4 mg/dL — ABNORMAL HIGH (ref 0.3–1.2)
Total Protein: 5.2 g/dL — ABNORMAL LOW (ref 6.5–8.1)

## 2017-08-07 MED ORDER — LIDOCAINE HCL (PF) 1 % IJ SOLN
INTRAMUSCULAR | Status: DC | PRN
  Administered 2017-08-07: 10 mL

## 2017-08-07 MED ORDER — LIDOCAINE HCL 1 % IJ SOLN
INTRAMUSCULAR | Status: AC
  Filled 2017-08-07: qty 20

## 2017-08-07 NOTE — Consult Note (Signed)
Consultation Note Date: 08/07/2017   Patient Name: Jose [name]  DOB: Sep 11, 1955  MRN: [id number]  Age / Sex: 62 y.o., male  PCP: Jose Mccreedy, MD Referring Physician: Louellen Molder, MD  Reason for Consultation: Establishing goals of care and Psychosocial/spiritual support  HPI/Patient Profile: 62 y.o. male  with past medical history of hep C, hemachromatosis, cirrhosis, polysubstance use d/o (inclding ETOH) admitted on 08/04/2017 with abd pain and swelling, weakness. Pt underwent paracentesis on 4/9 for 5L and again on 4/12 for 6.3. Pt referred to IR for liver bx but was unable to have it performed 2/2 ascites. His albumin is 2.5. MRI of abd reveals 4 x 5.5 mass suspicious for malignancy. Out-pt oncology referral recommended.  Consult ordered for Jose [name]. Palliative medicine team has met with Jose [name] in 5/17 as well as 08/18/16 for Jose [name] discussion..   Clinical Assessment and Goals of Care: Met with pt, chart reviewed.   Pt at this point is capable in participating in [place]. His HCPOA is his sister, Jose [name]  He becomes very tearful when "cancer" is mentioned and feels overwhelmed at this point    [place] full scope of treatment for now including full code Pt indicates he wants to pursue tx for [facility] if it's available; "I'd like to live a couple of more years" Appt set with sister, and pt for 4/13 at 10am I anticipate pt will need ongoing palliative support which is available through [facility] as well as out pt providers accessible thru [facility], [phone]; [facility] [phone] Code Status/Advance Care Planning:  Full code    Symptom Management:   Pain: Educated pt on tylenol's toxicity to liver and to utilize  sparingly. Cont with oxycodone 5 mg PRN. Pt also has had AKI , oxycodone better tolerated in this  setting than MS04, NSAIDS  Palliative Prophylaxis:   Aspiration, Bowel Regimen, Delirium Protocol, Eye Care, Frequent Pain Assessment, Oral Care and Turn Reposition  Additional Recommendations (Limitations, Scope, Preferences):  Full Scope Treatment  Psycho-social/Spiritual:   Desire for further Chaplaincy support:no  Additional Recommendations: Referral to Community Resources   Prognosis:   Unable to determine  Discharge Planning: To Be Determined      Primary Diagnoses: Present on Admission: . (Resolved) Anemia . Acute anemia . Alcoholic cirrhosis of liver with ascites ([place]) . Alcoholism ([place]) . End stage liver disease ([place]) . Essential hypertension . Smoker . Thrombocytopenia ([place]) . Polysubstance abuse ([place]) . Anemia   I have reviewed the medical record, interviewed the patient and family, and examined the patient. The following aspects are pertinent.  Past Medical History:  Diagnosis Date  . Alcohol dependence ([place])   . Ascites   . Cirrhosis ([place])   . Depression   . 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 Hepatitis C    Hepatitis C  . HOH (hard of hearing)   . Hypertension   .  Inguinal hernia    right  . QT prolongation   . Shortness of breath    with exertion  . Thrombocytopenia ([place])    Social History   Socioeconomic History  . Marital status: Single    Spouse name: Not on file  . Number of children: Not on file  . Years of education: Not on file  . Highest education level: Not on file  Occupational History  . Not on file  Social Needs  . Financial resource strain: Not on file  . Food insecurity:    Worry: Not on file    Inability: Not on file  . Transportation needs:    Medical: Not on file    Non-medical: Not on file  Tobacco Use  . Smoking status: Current Some Day Smoker    Packs/day: 0.12    Years: 47.00    Pack years: 5.64    Types: Cigarettes  . Smokeless tobacco: Never Used    Substance and Sexual Activity  . Alcohol use: Yes    Comment: denies any in the last few weeks  . Drug use: No    Types: Marijuana, Cocaine    Comment: Last time for cocaine a few weeks ago ;Marijuana- intermittent  . Sexual activity: Not Currently    Comment: MPOA sister and Jose [name]  Lifestyle  . Physical activity:    Days per week: Not on file    Minutes per session: Not on file  . Stress: Not on file  Relationships  . Social connections:    Talks on phone: Not on file    Gets together: Not on file    Attends religious service: Not on file    Active member of club or organization: Not on file    Attends meetings of clubs or organizations: Not on file    Relationship status: Not on file  Other Topics Concern  . Not on file  Social History Narrative   ** Merged History Encounter **       Family History  Problem Relation Age of Onset  . Breast cancer Other   . Diabetes Other   . Hypertension Other   . Breast cancer Mother   . Hypertension Mother   . Diabetes Father   . Hypertension Sister   . Heart disease Sister   . Hypertension Paternal Grandmother   . Colon cancer Neg Hx    Scheduled Meds: . furosemide  40 mg Oral Daily  . lidocaine      . oxyCODONE  5 mg Oral BID  . pantoprazole  40 mg Oral BID  . rifaximin  550 mg Oral BID  . spironolactone  50 mg Oral Daily   Continuous Infusions: . sodium chloride 10 mL/hr (08/06/17 2219)  . albumin human 0 g (08/07/17 0000)  . cefTRIAXone (ROCEPHIN) IVPB 1 gram/100 mL NS (Mini-Bag Plus) 1 g (08/07/17 1528)   PRN Meds:.acetaminophen **OR** acetaminophen, lidocaine (PF), ondansetron **OR** ondansetron (ZOFRAN) IV Medications Prior to Admission:  Prior to Admission medications   Medication Sig Start Date End Date Taking? Authorizing Provider  Cholecalciferol (VITAMIN D-3) 1000 units CAPS Take 1,000 Units by mouth daily.   Yes [provider]  fluticasone (FLONASE) 50 MCG/ACT nasal spray Place 1 spray into  both nostrils daily as needed for allergies.    Yes [provider]  hydroxypropyl methylcellulose / hypromellose (ISOPTO TEARS / GONIOVISC) 2.5 % ophthalmic solution Place 2 drops into both eyes 3 (three) times daily as needed for dry  eyes.   Yes [provider]  lactulose (CHRONULAC) 10 GM/15ML solution Take 45 mLs (30 g total) by mouth 4 (four) times daily. Patient taking differently: Take 20 g by mouth 3 (three) times daily.  08/06/16  Yes Reyne Dumas, MD  Multiple Vitamin (TAB-A-VITE) TABS Take 1 tablet by mouth daily.   Yes [provider]  PROAIR HFA 108 (90 Base) MCG/ACT inhaler Inhale 2 puffs into the lungs 4 (four) times daily as needed for shortness of breath or wheezing. 04/02/17  Yes [provider]  rifaximin (XIFAXAN) 550 MG TABS tablet Take 1 tablet (550 mg total) by mouth 2 (two) times daily. 11/14/16  Yes Danis, Kirke Corin, MD  triamterene-hydrochlorothiazide (MAXZIDE-25) 37.5-25 MG tablet Take 1 tablet by mouth daily.   Yes [provider]   No Known Allergies Review of Systems  Unable to perform ROS: Other    Physical Exam  Constitutional: He is oriented to person, place, and time. He appears well-developed and well-nourished.  HENT:  Head: Normocephalic and atraumatic.  Neck: Normal range of motion.  Cardiovascular: Normal rate.  Pulmonary/Chest: Effort normal.  Abdominal: He exhibits distension.  ascites  Musculoskeletal: Normal range of motion.  Neurological: He is alert and oriented to person, place, and time.  Psychiatric:  tearful  Nursing note and vitals reviewed.   Vital Signs: BP 111/73 Comment: post paracentesis  Pulse 61   Temp 98.4 F (36.9 C) (Oral)   Resp 16   Ht _0  (1.676 m)   Wt 85.1 kg (187 lb 9.8 oz)   SpO2 99%   BMI 30.28 kg/m  Pain Scale: 0-10 POSS *See Group Information*: 1-Acceptable,Awake and alert Pain Score: 5    SpO2: SpO2: 99 % O2 Device:SpO2: 99 % O2 Flow Rate: .   IO:  Intake/output summary:   Intake/Output Summary (Last 24 hours) at 08/07/2017 1603 Last data filed at 08/07/2017 1100 Gross per 24 hour  Intake 1606 ml  Output 375 ml  Net 1231 ml    LBM: Last BM Date: 08/06/17 Baseline Weight: Weight: 85.1 kg (187 lb 9.8 oz) Most recent weight: Weight: 85.1 kg (187 lb 9.8 oz)     Palliative Assessment/Data:   Flowsheet Rows     Most Recent Value  Intake Tab  Referral Department  Hospitalist  Unit at Time of Referral  Med/Surg Unit  Palliative Care Primary Diagnosis  Other (Comment)  Date Notified  08/06/17  Palliative Care Type  Return patient Palliative Care  Reason for referral  Clarify Goals of Care  Date of Admission  08/04/17  Date first seen by Palliative Care  08/07/17  # of days Palliative referral response time  1 Day(s)  # of days IP prior to Palliative referral  2  Clinical Assessment  Palliative Performance Scale Score  50%  Pain Max last 24 hours  0  Pain Min Last 24 hours  0  Dyspnea Max Last 24 Hours  0  Dyspnea Min Last 24 hours  0  Nausea Max Last 24 Hours  0  Nausea Min Last 24 Hours  0  Anxiety Max Last 24 Hours  0  Anxiety Min Last 24 Hours  0  Other Max Last 24 Hours  0  Psychosocial & Spiritual Assessment  Palliative Care Outcomes  Patient/Family meeting held?  Yes  Who was at the meeting?  pt,  plan to meet with pt and sister 4/13 _1   Palliative Care follow-up planned  Yes, Facility  Time In: 1500 Time Out: 1610 Time Total: 70 min Greater than 50%  of this time was spent counseling and coordinating care related to the above assessment and plan.  Signed by: Dory Horn, NP   Please contact Palliative Medicine Team phone at [phone] for questions and concerns.  For individual provider: See Shea Evans

## 2017-08-07 NOTE — Procedures (Signed)
PROCEDURE SUMMARY:  Successful US guided paracentesis from RLQ.  Yielded 6.3 L of clear yellow fluid.  No immediate complications.  Pt tolerated well.   Specimen was sent for labs.  Ascencion Dike PA-C 08/07/2017 1:54 PM

## 2017-08-07 NOTE — Progress Notes (Addendum)
[facility] Progress Note   Chief Complaint:  Cirrhosis, ascites, liver lesion   SUBJECTIVE:     Feels okay, no abdominal pain. Awaiting LVP  ASSESSMENT AND PLAN:   105. 62 yo male with decompensated HCV / ETOH cirrhosis, ascites and upper GI bleed. EGD this admission remarkable for duodenal erosions. No varices -hgb 7.5, overall stable post transfusion on 4/9 -continue BID PPI , will make dose adjustments if necessary when seen in office.  -Getting another LVP today. Cell count ordered. He hasn't had cytology so added that as well as an albumin to calculate SAAG.  -2 gram sodium diet  -continue lasix / aldactone. On low dose of 40 mg / 50 mg respectively in setting of AKI / ? On CKD.  -continue xifaxan, lactulose upon discharge  2. Liver lesion 4.3 x 4.6 cm ,suspicious for HCC. AFP only midly elevated. Liver bx not done, high risk for post-procedure complications. Consulted Oncology yesterday, they will evaluate him outpatient.   3. Cocaine use   .  Attending physician's note   I have taken an interval history, reviewed the chart and examined the patient. I agree with the Advanced Practitioner's note, impression and recommendations. S/P repeat therapeutic paracentesis today.  Please see previous note for details.  Oncology consultation as an outpatient for hepatocellular carcinoma in setting of decompensated liver cirrhosis due to HCV/alcohol.  Agree with palliative care.  Follow-up with Dr Loletha Carrow     Carmell Austria, MD   OBJECTIVE:    Vital signs in last 24 hours: Temp:  [97.9 F (36.6 C)-98.4 F (36.9 C)] 98.4 F (36.9 C) (04/12 1100) Pulse Rate:  [59-62] 61 (04/12 1100) Resp:  [16-20] 16 (04/12 0602) BP: (105-121)/(67-81) 120/75 (04/12 1100) SpO2:  [99 %-100 %] 99 % (04/12 1100) Last BM Date: 08/06/17 General:   Alert, black male in NAD Heart:  Regular rate and rhythm; + murmur, BLE pitting edema Pulm: Normal respiratory effor Abdomen:  Soft,  distended, nontender.  Normal bowel sounds, small umbilical hernia Neurologic:  Alert and  oriented x4;  grossly normal neurologically. Psych:  Pleasant, cooperative.  Normal mood and affect.   Intake/Output from previous day: 04/11 0701 - 04/12 0700 In: 1565 [P.O.:540; I.V.:825; IV Piggyback:200] Out: 775 [Urine:775] Intake/Output this shift: Total I/O In: 281 [P.O.:281] Out: -   Lab Results: Recent Labs    08/05/17 0618 08/06/17 0445 08/07/17 0649  WBC 5.2 5.7 4.6  HGB 7.9* 8.0* 7.5*  HCT 23.4* 23.7* 22.4*  PLT 99* 102* 90*   BMET Recent Labs    08/05/17 0618 08/06/17 0445 08/07/17 0649  NA 127* 128* 130*  K 3.6 3.4* 3.5  CL 98* 99* 99*  CO2 19* 21* 22  GLUCOSE 131* 146* 123*  BUN 54* 46* 41*  CREATININE 1.95* 2.06* 2.02*  CALCIUM 7.7* 7.4* 7.8*   LFT Recent Labs    08/07/17 0649  PROT 5.2*  ALBUMIN 2.5*  AST 68*  ALT 65*  ALKPHOS 161*  BILITOT 2.4*   US Scrotum  Result Date: 08/05/2017 CLINICAL DATA:  Scrotal edema EXAM: ULTRASOUND OF SCROTUM TECHNIQUE: Complete ultrasound examination of the testicles, epididymis, and other scrotal structures was performed. COMPARISON:  CT 05/20/2016, ultrasound scrotum 05/20/2016 FINDINGS: Right testicle Measurements: 4.6 x 1.2 x 2.7 cm. No mass or microlithiasis visualized. Displaced anteriorly by large scrotal fluid collection. Left testicle Measurements: 4.4 x 2.4 x 1.8 cm. No mass or microlithiasis visualized. Right epididymis:  Normal in size and appearance. Left epididymis:  Normal in size and appearance. Hydrocele: Very large septated, mildly complex right hydrocele. Fluid in the right scrotum measures at least 20.7 cm. Varicocele:  Small bilateral varicoceles. IMPRESSION: 1. Large septated/slightly complex right scrotal fluid collection measuring at least 20.7 cm. This is increased compared to the prior ultrasound. No definite herniated bowel loops at the time of imaging. 2. Bilateral varicoceles Electronically Signed    By: Donavan Foil M.D.   On: 08/05/2017 16:16   US Renal  Result Date: 08/05/2017 CLINICAL DATA:  Acute kidney injury, hypertension EXAM: RENAL / URINARY TRACT ULTRASOUND COMPLETE COMPARISON:  Abdomen complete ultrasound 07/12/2017 FINDINGS: Right Kidney: Length: 12.4 cm. Normal cortical thickness. Upper normal cortical echogenicity. No mass, hydronephrosis or shadowing calcification. Left Kidney: Length: 11.6 cm. Normal cortical thickness. Upper normal cortical echogenicity. No mass, hydronephrosis or shadowing calcification. Bladder: Bladder unremarkable.  Prostate gland minimally prominent. Moderate ascites. IMPRESSION: No definite urinary tract abnormalities. Moderate ascites. Electronically Signed   By: Lavonia Dana M.D.   On: 08/05/2017 15:17     Principal Problem:   Acute anemia Active Problems:   Thrombocytopenia ([place])   Alcoholism ([place])   Alcoholic cirrhosis of liver with ascites ([place])   Essential hypertension   Liver mass, right lobe   End stage liver disease ([place])   Smoker   Polysubstance abuse ([place])   Upper GI bleed   Anemia   Decompensation of cirrhosis of liver ([place])     LOS: 2 days   Tye Savoy ,NP 08/07/2017, 12:07 PM  Pager number [phone]

## 2017-08-07 NOTE — Progress Notes (Addendum)
PROGRESS NOTE                                                                                                                                                                                                             Patient Demographics:    Jose [name], is a 62 y.o. male, DOB - 1955/07/09, XHB:[id number]  Admit date - 08/04/2017   Admitting Physician Jose Bongo, MD  Outpatient Primary MD for the patient is Jose Mccreedy, MD  LOS - 2  Outpatient Specialists: [facility]  Chief Complaint  Patient presents with  . Abnormal Lab       Brief Narrative   62 year old male with decompensated liver disease secondary to ongoing alcohol use and hep C with ascites, esophageal/gastric varices, ongoing alcohol dependence, polysubstance abuse and recently diagnosed enlarging liver mass who presented to IR for paracentesis and liver mass biopsy was found to have significant drop in H&H.  (Hemoglobin of 6.3 from baseline around 9).  Patient also complained of vomiting of brown liquid the previous night. [facility] consulted and recommended admission and further workup.  Stool for Hemoccult was positive. Patient had therapeutic paracentesis with 5 L clear yellow colored fluid removed.   Subjective:   Denies any specific symptoms.  No overnight events.  Wanting to go home.   Assessment  & Plan :    Principal Problem:   Acute symptomatic anemia Drop of 3 g hemoglobin from baseline, improved with 2 unit PRBC .   EGD repeated showing nonbleeding duodenal erosions, no bleeding varices. Patient is currently stable.  (Hemoglobin 7.5 today)  Progressive right liver lesion. Recent MRI showing enlarging infiltrative lesion on the right lower liver measuring 4 x 5.5 cm.  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  Patient's AFP was 16 and prior biopsy of this lesion in 2017 was concerning for [facility] but was nondiagnostic. Had scheduled  liver biopsy on 4/9 but was held due to acute anemia.   IR consulted for biopsy but recommend given large volume ascites and abnormal labs patient is at high risk for bleeding with percutaneous biopsy and recommend to hold off on it until outpatient oncology evaluation.  GI discussed with oncology ( Dr Jose [name]) who recommends patient to be seen as outpatient.   Active Problems:      Alcoholism ([place]) Decompensated liver cirrhosis with ascites (  [place]) paracentesis with 5 L clear yellow fluid removed on 4/9.Mar[place] Kitchen  Still distended with significant scrotal edema. Added Lasix and Aldactone.  Continue rifaximin.  Order repeat large volume paracentesis, receiving full dose of IV albumin.  Ascites fluid study to rule out SBP.  Monitor LFTs.  Patient is quite jaundiced. Patient is not a candidate for liver transplant due to ongoing alcohol use.  Acute kidney injury ([place]) Suspect ATN versus ?hepatorenal syndrome.. Urine output better today.  Urine sodium and osmolality normal.  Renal ultrasound without acute findings.  Avoid nephrotoxins.   Continue to monitor with Lasix, Aldactone.  Also received IV hydration..  If no improvement and remains oliguric.  Nephrology consult.   Thrombocytopenia ([place]) Secondary to decompensated cirrhosis.  Currently stable.  Ongoing alcohol use/polysubstance abuse Counseled strongly on cessation.  Still drinks 2-3 beers daily.  Urine drug screen is positive for cocaine.  No signs of withdrawal or encephalopathy. .  Right lower lobe nodule As seen on recent MRI of the abdomen.  Cannot obtain CT of the chest with contrast due to renal dysfunction.  If liver biopsy positive may consider PET scan as outpatient.  Hypervolemic hyponatremia Secondary to ascites.  Chronic.  Monitor for now.  Hypokalemia Replenished  Code Status : Full code, prognosis is guarded.  Palliative care consulted for goals of care discussion.  Family Communication  : None at bedside  today  Disposition Plan  : Pending clinical improvement and AK I, large volume paracentesis  Barriers For Discharge : Active symptoms  Consults  : GI  Procedures  : Paracentesis, EGD, liver biopsy  DVT Prophylaxis  : SCDs  Lab Results  Component Value Date   PLT 90 (L) 08/07/2017    Antibiotics  :   Anti-infectives (From admission, onward)   Start     Dose/Rate Route Frequency Ordered Stop   08/05/17 1400  cefTRIAXone (ROCEPHIN) 1 g in sodium chloride 0.9 % 100 mL IVPB     1 g 200 mL/hr over 30 Minutes Intravenous Every 24 hours 08/04/17 1600     08/04/17 2200  rifaximin (XIFAXAN) tablet 550 mg     550 mg Oral 2 times daily 08/04/17 1600     08/04/17 1415  cefTRIAXone (ROCEPHIN) 1 g in sodium chloride 0.9 % 100 mL IVPB     1 g 200 mL/hr over 30 Minutes Intravenous  Once 08/04/17 1414 08/04/17 1543        Objective:   Vitals:   08/06/17 1410 08/06/17 2051 08/07/17 0602 08/07/17 1100  BP: 121/81 105/67 113/73 120/75  Pulse: 61 62 (!) 59 61  Resp: 20 17 16    Temp: 97.9 F (36.6 C) 98.1 F (36.7 C) 98.2 F (36.8 C) 98.4 F (36.9 C)  TempSrc: Oral Oral Oral Oral  SpO2: 100% 100% 100% 99%  Weight:      Height:        Wt Readings from Last 3 Encounters:  08/04/17 85.1 kg (187 lb 9.8 oz)  08/04/17 91.2 kg (201 lb)  07/16/17 86.3 kg (190 lb 3.2 oz)     Intake/Output Summary (Last 24 hours) at 08/07/2017 1316 Last data filed at 08/07/2017 1100 Gross per 24 hour  Intake 1606 ml  Output 575 ml  Net 1031 ml     Physical Exam General: Not in distress HEENT: Pallor present, anicteric, moist mucosa Chest: Clear bilaterally CVs: Normal S1-S2, no murmurs GI: Markedly distended abdomen, nontender, protuberant umbilicus with large scrotal edema Musculoskeletal: Warm, 1+ pitting edema bilaterally  CNS: Oriented, no tremors      Data Review:    CBC Recent Labs  Lab 08/04/17 1057 08/05/17 0043 08/05/17 0618 08/06/17 0445 08/07/17 0649  WBC 6.1 6.0 5.2 5.7  4.6  HGB 6.3* 7.7* 7.9* 8.0* 7.5*  HCT 18.5* 23.1* 23.4* 23.7* 22.4*  PLT 129* 100* 99* 102* 90*  MCV 90.7 88.5 89.3 88.8 88.9  MCH 30.9 29.5 30.2 30.0 29.8  MCHC 34.1 33.3 33.8 33.8 33.5  RDW 17.6* 17.6* 18.7* 18.5* 18.3*    Chemistries  Recent Labs  Lab 08/04/17 1859 08/05/17 0618 08/06/17 0445 08/07/17 0649  NA 128* 127* 128* 130*  K 3.3* 3.6 3.4* 3.5  CL 99* 98* 99* 99*  CO2 20* 19* 21* 22  GLUCOSE 110* 131* 146* 123*  BUN 60* 54* 46* 41*  CREATININE 2.18* 1.95* 2.06* 2.02*  CALCIUM 7.7* 7.7* 7.4* 7.8*  AST 106*  --  76* 68*  ALT 111*  --  82* 65*  ALKPHOS 144*  --  158* 161*  BILITOT 3.8*  --  3.0* 2.4*   ------------------------------------------------------------------------------------------------------------------ No results for input(s): CHOL, HDL, LDLCALC, TRIG, CHOLHDL, LDLDIRECT in the last 72 hours.  Lab Results  Component Value Date   HGBA1C 5.2 07/15/2016   ------------------------------------------------------------------------------------------------------------------ No results for input(s): TSH, T4TOTAL, T3FREE, THYROIDAB in the last 72 hours.  Invalid input(s): FREET3 ------------------------------------------------------------------------------------------------------------------ No results for input(s): VITAMINB12, FOLATE, FERRITIN, TIBC, IRON, RETICCTPCT in the last 72 hours.  Coagulation profile Recent Labs  Lab 08/04/17 1057  INR 1.83    No results for input(s): DDIMER in the last 72 hours.  Cardiac Enzymes No results for input(s): CKMB, TROPONINI, MYOGLOBIN in the last 168 hours.  Invalid input(s): CK ------------------------------------------------------------------------------------------------------------------    Component Value Date/Time   BNP 39.8 07/24/2015 0230    Inpatient Medications  Scheduled Meds: . furosemide  40 mg Oral Daily  . oxyCODONE  5 mg Oral BID  . pantoprazole  40 mg Oral BID  . rifaximin  550 mg  Oral BID  . spironolactone  50 mg Oral Daily   Continuous Infusions: . sodium chloride 10 mL/hr (08/06/17 2219)  . albumin human 0 g (08/07/17 0000)  . cefTRIAXone (ROCEPHIN) IVPB 1 gram/100 mL NS (Mini-Bag Plus) Stopped (08/06/17 1350)   PRN Meds:.acetaminophen **OR** acetaminophen, ondansetron **OR** ondansetron (ZOFRAN) IV  Micro Results No results found for this or any previous visit (from the past 240 hour(s)).  Radiology Reports Ct Head Wo Contrast  Result Date: 07/12/2017 CLINICAL DATA:  Patient with left upper extremity numbness. EXAM: CT HEAD WITHOUT CONTRAST TECHNIQUE: Contiguous axial images were obtained from the base of the skull through the vertex without intravenous contrast. COMPARISON:  Brain CT 08/14/2016. FINDINGS: Brain: Ventricles and sulci are prominent compatible with atrophy. Periventricular and subcortical white matter hypodensity compatible with chronic microvascular ischemic changes. No evidence for acute cortically based infarct, intracranial hemorrhage, mass lesion or mass-effect. Vascular: Unremarkable. Skull: Intact. Sinuses/Orbits: Paranasal sinuses are well aerated. Mastoid air cells unremarkable. Orbits unremarkable. Chronic deformity of the medial orbital walls bilaterally. Other: None. IMPRESSION: No acute intracranial process. Atrophy and chronic microvascular ischemic changes. Electronically Signed   By: Lovey Newcomer M.D.   On: 07/12/2017 12:46   US Abdomen Complete  Result Date: 07/12/2017 CLINICAL DATA:  Patient with history of hepatitis C, cirrhosis. Hypertension. Elevated LFTs. EXAM: ABDOMEN ULTRASOUND COMPLETE COMPARISON:  Ultrasound 03/27/2017. FINDINGS: Gallbladder: Sludge within the gallbladder lumen. Gallbladder wall thickening measuring up to 3.6 mm. Negative sonographic Murphy's sign. Common bile  duct: Diameter: 4 mm Liver: The liver is heterogeneous in echogenicity and nodular in contour. Within the right hepatic lobe there is a 5.9 x 6.5 x 5.2 cm  mixed echogenicity mass, previously measuring 4.4 x 3.9 x 4.2 cm. 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 IVC: No abnormality visualized. Pancreas: Visualized portion unremarkable. Spleen: Size and appearance within normal limits. Right Kidney: Length: 11.5 cm. Echogenicity within normal limits. No mass or hydronephrosis visualized. Left Kidney: Length: 12.2 cm. Echogenicity within normal limits. No mass or hydronephrosis visualized. Abdominal aorta: No aneurysm visualized. Other findings: None. IMPRESSION: 1. Interval increase in size of mixed echogenicity mass within the right hepatic lobe, potentially representing hepatocellular carcinoma. 2. Stones and sludge within the gallbladder lumen with associated wall thickening. Wall thickening is nonspecific in the setting of cirrhosis. 3. Cirrhosis. 4. 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 Electronically Signed   By: Lovey Newcomer M.D.   On: 07/12/2017 12:42   US Scrotum  Result Date: 08/05/2017 CLINICAL DATA:  Scrotal edema EXAM: ULTRASOUND OF SCROTUM TECHNIQUE: Complete ultrasound examination of the testicles, epididymis, and other scrotal structures was performed. COMPARISON:  CT 05/20/2016, ultrasound scrotum 05/20/2016 FINDINGS: Right testicle Measurements: 4.6 x 1.2 x 2.7 cm. No mass or microlithiasis visualized. Displaced anteriorly by large scrotal fluid collection. Left testicle Measurements: 4.4 x 2.4 x 1.8 cm. No mass or microlithiasis visualized. Right epididymis:  Normal in size and appearance. Left epididymis:  Normal in size and appearance. Hydrocele: Very large septated, mildly complex right hydrocele. Fluid in the right scrotum measures at least 20.7 cm. Varicocele:  Small bilateral varicoceles. IMPRESSION: 1. Large septated/slightly complex right scrotal fluid collection measuring at least 20.7 cm. This is increased compared to the prior ultrasound. No definite herniated bowel loops at the time of imaging. 2.  Bilateral varicoceles Electronically Signed   By: Donavan Foil M.D.   On: 08/05/2017 16:16   Mr Liver W GB Contrast  Result Date: 07/21/2017 CLINICAL DATA:  Hepatitis-C.  Cirrhosis.  Hemochromatosis. EXAM: MRI ABDOMEN WITHOUT AND WITH CONTRAST TECHNIQUE: Multiplanar multisequence MR imaging of the abdomen was performed both before and after the administration of intravenous contrast. CONTRAST:  81mL MULTIHANCE GADOBENATE DIMEGLUMINE 529 MG/ML IV SOLN COMPARISON:  07/12/2017 ultrasound.  MRI 08/05/2016. FINDINGS: Moderate motion degradation throughout. Lower chest: New small bilateral pleural effusions. Normal heart size. Right lower lobe pulmonary nodules on the order of 3 mm x 2 are new since the prior MRI and CT. There is probable atelectasis at the left lung base. Periesophageal varices. Hepatobiliary: Cirrhosis. Probable cyst in the left hepatic lobe. Significant enlargement of a partially branching, precontrast T1 hyperintense, arterial phase hyperenhancing, delayed hypoenhancing liver lesion. This may extend minimally into the medial segment left liver lobe. Centered in segments 6 and 7. Superiorly, this measures on the order of 4.0 x 5.5 cm on image [id number]. More inferiorly, contiguous rounded portion measures 4.3 x 4.6 cm on image 53. Gallbladder wall thickening is again identified and nonspecific in the setting of cirrhosis. No biliary duct dilatation. Pancreas:  Grossly normal period without duct dilatation. Spleen:  Presumed siderotic nodules within the medial spleen. Adrenals/Urinary Tract: Normal adrenal glands. Normal kidneys, without hydronephrosis. Stomach/Bowel: Grossly normal stomach and abdominal bowel loops. Vascular/Lymphatic: Portal venous hypertension, with recannulized periumbilical vein and gastric varices. Patent main portal vein. Right portal vein not well evaluated. No gross abdominal adenopathy. Other:  Increase in moderate volume ascites. Musculoskeletal: No acute osseous  abnormality. IMPRESSION: 1. Moderately motion degraded exam. 2.  Significant enlargement of a heterogeneous, infiltrative lesion centered in the right lobe of the liver. This is favored to represent progressive hepatocellular carcinoma. Imaging findings are not diagnostic, however given degradation of today's exam and the somewhat unusual lesion morphology. 3. Right portal vein not well evaluated, and tumor involvement cannot be excluded. Liver protocol CT may be informative given limitation of current exam. 4. Cirrhosis and portal venous hypertension with increased ascites and development of small bilateral pleural effusions. 5. New right lower lobe nodularity. If the patient is diagnosed with hepatocellular carcinoma, consider dedicated chest CT for staging. Electronically Signed   By: Abigail Miyamoto M.D.   On: 07/21/2017 10:53   US Renal  Result Date: 08/05/2017 CLINICAL DATA:  Acute kidney injury, hypertension EXAM: RENAL / URINARY TRACT ULTRASOUND COMPLETE COMPARISON:  Abdomen complete ultrasound 07/12/2017 FINDINGS: Right Kidney: Length: 12.4 cm. Normal cortical thickness. Upper normal cortical echogenicity. No mass, hydronephrosis or shadowing calcification. Left Kidney: Length: 11.6 cm. Normal cortical thickness. Upper normal cortical echogenicity. No mass, hydronephrosis or shadowing calcification. Bladder: Bladder unremarkable.  Prostate gland minimally prominent. Moderate ascites. IMPRESSION: No definite urinary tract abnormalities. Moderate ascites. Electronically Signed   By: Lavonia Dana M.D.   On: 08/05/2017 15:17   Ir Paracentesis  Result Date: 08/04/2017 INDICATION: Patient with alcoholic cirrhosis, hep C, enlarging liver lesion, ascites. Request is made for therapeutic paracentesis. EXAM: ULTRASOUND GUIDED THERAPEUTIC PARACENTESIS MEDICATIONS: 10 mL 2% lidocaine COMPLICATIONS: None immediate. PROCEDURE: Informed written consent was obtained from the patient after a discussion of the risks,  benefits and alternatives to treatment. A timeout was performed prior to the initiation of the procedure. Initial ultrasound scanning demonstrates a large amount of ascites within the right lower abdominal quadrant. The right lower abdomen was prepped and draped in the usual sterile fashion. 2% lidocaine was used for local anesthesia. Following this, a 19 gauge, 7-cm, Yueh catheter was introduced. An ultrasound image was saved for documentation purposes. The paracentesis was performed. The catheter was removed and a dressing was applied. The patient tolerated the procedure well without immediate post procedural complication. FINDINGS: A total of approximately 5.0 liters of clear, yellow fluid was removed. IMPRESSION: Successful ultrasound-guided therapeutic paracentesis yielding 5.0 liters of peritoneal fluid. Read by: Brynda Greathouse PA-C Electronically Signed   By: Jerilynn Mages.  Shick M.D.   On: 08/04/2017 13:37    Time Spent in minutes  25   Wynton Hufstetler M.D on 08/07/2017 at 1:16 PM  Between 7am to 7pm - Pager - [phone]  After 7pm go to [email or web address] - password [facility]  [facility] -  Office  [phone]

## 2017-08-08 DIAGNOSIS — D649 Anemia, unspecified: Secondary | ICD-10-CM

## 2017-08-08 DIAGNOSIS — R160 Hepatomegaly, not elsewhere classified: Secondary | ICD-10-CM

## 2017-08-08 DIAGNOSIS — K7290 Hepatic failure, unspecified without coma: Secondary | ICD-10-CM

## 2017-08-08 DIAGNOSIS — K7031 Alcoholic cirrhosis of liver with ascites: Secondary | ICD-10-CM

## 2017-08-08 DIAGNOSIS — Z7189 Other specified counseling: Secondary | ICD-10-CM

## 2017-08-08 DIAGNOSIS — Z515 Encounter for palliative care: Secondary | ICD-10-CM

## 2017-08-08 LAB — COMPREHENSIVE METABOLIC PANEL
ALT: 54 U/L (ref 17–63)
AST: 64 U/L — AB (ref 15–41)
Albumin: 2.6 g/dL — ABNORMAL LOW (ref 3.5–5.0)
Alkaline Phosphatase: 136 U/L — ABNORMAL HIGH (ref 38–126)
Anion gap: 9 (ref 5–15)
BUN: 35 mg/dL — AB (ref 6–20)
CHLORIDE: 100 mmol/L — AB (ref 101–111)
CO2: 20 mmol/L — AB (ref 22–32)
CREATININE: 1.78 mg/dL — AB (ref 0.61–1.24)
Calcium: 7.9 mg/dL — ABNORMAL LOW (ref 8.9–10.3)
GFR, EST AFRICAN AMERICAN: 46 mL/min — AB (ref >60)
GFR, EST NON AFRICAN AMERICAN: 39 mL/min — AB (ref >60)
Glucose, Bld: 110 mg/dL — ABNORMAL HIGH (ref 65–99)
POTASSIUM: 3.5 mmol/L (ref 3.5–5.1)
SODIUM: 129 mmol/L — AB (ref 135–145)
Total Bilirubin: 2.8 mg/dL — ABNORMAL HIGH (ref 0.3–1.2)
Total Protein: 5.6 g/dL — ABNORMAL LOW (ref 6.5–8.1)

## 2017-08-08 LAB — CBC
HCT: 23 % — ABNORMAL LOW (ref 39.0–52.0)
Hemoglobin: 7.5 g/dL — ABNORMAL LOW (ref 13.0–17.0)
MCH: 28.6 pg (ref 26.0–34.0)
MCHC: 32.6 g/dL (ref 30.0–36.0)
MCV: 87.8 fL (ref 78.0–100.0)
PLATELETS: 86 10*3/uL — AB (ref 150–400)
RBC: 2.62 MIL/uL — AB (ref 4.22–5.81)
RDW: 18 % — AB (ref 11.5–15.5)
WBC: 5.1 10*3/uL (ref 4.0–10.5)

## 2017-08-08 LAB — GRAM STAIN

## 2017-08-08 LAB — ALBUMIN, FLUID (OTHER): Albumin, Body Fluid Other: 0.3 g/dL

## 2017-08-08 MED ORDER — ONDANSETRON HCL 4 MG PO TABS: 4.0000 mg | ORAL_TABLET | Freq: Four times a day (QID) | ORAL | 0 refills | Status: AC | PRN

## 2017-08-08 MED ORDER — OXYCODONE HCL 5 MG PO TABS: 5.0000 mg | ORAL_TABLET | Freq: Two times a day (BID) | ORAL | 0 refills | Status: DC

## 2017-08-08 MED ORDER — SPIRONOLACTONE 50 MG PO TABS: 50.0000 mg | ORAL_TABLET | Freq: Every day | ORAL | 0 refills | Status: AC

## 2017-08-08 MED ORDER — PANTOPRAZOLE SODIUM 40 MG PO TBEC: 40.0000 mg | DELAYED_RELEASE_TABLET | Freq: Two times a day (BID) | ORAL | 0 refills | Status: AC

## 2017-08-08 MED ORDER — FUROSEMIDE 40 MG PO TABS: 40.0000 mg | ORAL_TABLET | Freq: Every day | ORAL | 0 refills | Status: AC

## 2017-08-08 NOTE — Progress Notes (Signed)
Daily Progress Note   Patient Name: Jose [name]       Date: 08/08/2017 DOB: 07-31-1955  Age: 62 y.o. MRN#: [id number] Attending Physician: Georgette Shell, MD Primary Care Physician: [name], MD Admit Date: 08/04/2017  Reason for Consultation/Follow-up: Establishing goals of care and Psychosocial/spiritual support  Subjective: Met with patient and his sister, chart reviewed.  As noted in initial consultation note on 08/07/2017, Jose [name] is known to the palliative medicine team and goals of care have been introduced to both patient as well as his sister.  Mrs. Ethel Rana, his sister, shares that they have talked about those issues since the last time they met with palliative care in 2018, but when patient comes to the emergency room he states "do everything for me".  We did spend some time defining full code versus DNR.  We also talked about what "everything" looks like.  Reiterated DNR does not mean do not treat, nor does it preclude him from pursuing chemotherapy radiation treatment paracentesis.  We also spent a lot of time addressing the importance of establishing with gastroenterology as well as oncology, not getting lost to follow-up.  Spoke to he and his sister about the importance of sobriety in the setting of now hepatocellular carcinoma.  Did inform patient and his sister that this would require a years worth of documented sobriety before he could even be considered for transplant.  Patient no longer lives in a group home, but lives in an apartment, [place]  Length of Stay: 3  Current Medications: Scheduled Meds:  . furosemide  40 mg Oral Daily  . oxyCODONE  5 mg Oral BID  . pantoprazole  40 mg Oral BID  . rifaximin  550 mg Oral BID  . spironolactone  50 mg Oral  Daily    Continuous Infusions: . sodium chloride 10 mL/hr (08/06/17 2219)  . cefTRIAXone (ROCEPHIN) IVPB 1 gram/100 mL NS (Mini-Bag Plus) Stopped (08/07/17 1600)    PRN Meds: acetaminophen **OR** acetaminophen, lidocaine (PF), ondansetron **OR** ondansetron (ZOFRAN) IV  Physical Exam  Constitutional: He is oriented to person, place, and time. He appears well-developed and well-nourished.  Cardiovascular: Normal rate.  3+ lower extremity edema as well as scrotal edema  Abdominal:  Mild abdominal distention.  Patient underwent paracentesis on 08/07/2017 for 6.3 L  Genitourinary:  Genitourinary Comments: Scrotal edema  Musculoskeletal: Normal range of motion.  Neurological: He is alert and oriented to person, place, and time.  Skin: Skin is warm and dry.  Psychiatric: He has a normal mood and affect. His behavior is normal. Judgment and thought content normal.  Nursing note and vitals reviewed.           Vital Signs: BP 108/68 (BP Location: Left Arm)   Pulse 65   Temp 98.3 F (36.8 C) (Oral)   Resp 18   Ht '5\' 6"'$  (1.676 m)   Wt 85.1 kg (187 lb 9.8 oz)   SpO2 100%   BMI 30.28 kg/m  SpO2: SpO2: 100 % O2 Device: O2 Device: Room Air O2 Flow Rate:    Intake/output summary:   Intake/Output Summary (Last 24 hours) at 08/08/2017 1232 Last data filed at 08/08/2017 0626 Gross per 24 hour  Intake -  Output 750 ml  Net -750 ml   LBM: Last BM Date: 08/08/17 Baseline Weight: Weight: 85.1 kg (187 lb 9.8 oz) Most recent weight: Weight: 85.1 kg (187 lb 9.8 oz)       Palliative Assessment/Data:    Flowsheet Rows     Most Recent Value  Intake Tab  Referral Department  Hospitalist  Unit at Time of Referral  Med/Surg Unit  Palliative Care Primary Diagnosis  Other (Comment)  Date Notified  08/06/17  Palliative Care Type  Return patient Palliative Care  Reason for referral  Clarify Goals of Care  Date of Admission  08/04/17  Date first seen by Palliative Care  08/07/17  # of  days Palliative referral response time  1 Day(s)  # of days IP prior to Palliative referral  2  Clinical Assessment  Palliative Performance Scale Score  50%  Pain Max last 24 hours  0  Pain Min Last 24 hours  0  Dyspnea Max Last 24 Hours  0  Dyspnea Min Last 24 hours  0  Nausea Max Last 24 Hours  0  Nausea Min Last 24 Hours  0  Anxiety Max Last 24 Hours  0  Anxiety Min Last 24 Hours  0  Other Max Last 24 Hours  0  Psychosocial & Spiritual Assessment  Palliative Care Outcomes  Patient/Family meeting held?  Yes  Who was at the meeting?  Jose [name],  plan to meet with Jose [name] and sister 4/13 '@10am'$   Palliative Care follow-up planned  Yes, Facility      Patient Active Problem List   Diagnosis Date Noted  . Decompensation of cirrhosis of liver ([place])   . Anemia 08/05/2017  . Polysubstance abuse ([place]) 08/04/2017  . Upper GI bleed 08/04/2017  . Acute anemia 07/13/2017  . Symptomatic anemia 07/12/2017  . Hyponatremia 07/12/2017  . Smoker 01/08/2017  . Pancytopenia, acquired ([place]) 01/08/2017  . Pain in right leg 10/06/2016  . End stage liver disease ([place]) 09/10/2016  . Palliative care encounter   . Liver mass, right lobe   . 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 Hepatitis C, chronic ([place]) 01/04/2016  . Right inguinal hernia 09/12/2015  . Essential hypertension 09/12/2015  . Gastroesophageal reflux disease without esophagitis 09/12/2015  . Protein-calorie malnutrition, severe ([place]) 09/12/2015  . Anemia of chronic disease 09/12/2015  . Arthritis of left knee 09/12/2015  . Elevated LFTs   . Alcoholic cirrhosis of liver with ascites ([place]) 07/25/2015  . Macrocytic anemia 07/23/2015  . Alcoholism ([place]) 07/12/2015  . Hepatic encephalopathy ([place]) 06/26/2015  . Adenomatous polyps 10/18/2013  . Unspecified vitamin D  deficiency 07/13/2013  . Atopic dermatitis 04/28/2013  . Thrombocytopenia ([place]) 04/28/2013    Palliative Care Assessment & Plan   Patient Profile: 62 y.o. male  with past medical history  of hep C, hemachromatosis, cirrhosis, polysubstance use d/o (inclding ETOH) admitted on 08/04/2017 with abd pain and swelling, weakness. Jose [name] underwent paracentesis on 4/9 for 5L and again on 4/12 for 6.3. Jose [name] referred to IR for liver bx but was unable to have it performed 2/2 ascites. His albumin is 2.5. MRI of abd reveals 4 x 5.5 mass suspicious for malignancy. Out-Jose [name] oncology referral recommended.  Consult ordered for [place]. Palliative medicine team has met with Jose [name] in 5/17 as well as 08/18/16 for [place] discussion.. Patient is being discharged home today with outpatient follow-up with gastroenterology as well as Dr. Alen Blew with oncology.   Recommendations/Plan:  Patient at this point remains a full code.  I do feel like he is better informed about setting limits such as a DNR  Recommend that he follow-up with outpatient palliative care.  Please see initial consultation note from 08/07/2017 for these resources.  [facility] also has a palliative medicine provider that is embedded in their practice and sees patients on Mondays that Jose [name] could be aligned with  Goals of Care and Additional Recommendations:  Limitations on Scope of Treatment: Full Scope Treatment  Code Status:    Code Status Orders  (From admission, onward)        Start     Ordered   08/04/17 1601  Full code  Continuous     08/04/17 1600    Code Status History    Date Active Date Inactive Code Status Order ID Comments User Context   07/12/2017 1234 07/15/2017 1615 Full Code [id number]  Hosie Poisson, MD Inpatient   08/14/2016 1547 08/18/2016 1903 Full Code [id number]  Samella Parr, NP Inpatient   07/30/2016 1918 08/06/2016 2021 Full Code [id number]  Domenic Polite, MD Inpatient   07/15/2016 2009 07/18/2016 1740 Full Code [id number]  Etta Quill, DO ED   06/06/2016 1549 06/10/2016 2056 Full Code [id number]  Edwin Dada, MD Inpatient   05/10/2016 2308 05/11/2016 2136 Full Code [id number]  Ivor Costa, MD  ED   04/26/2016 1228 04/28/2016 1638 Full Code [id number]  Cristal Ford, DO ED   09/04/2015 0111 09/07/2015 1645 Full Code [id number]  Rise Patience, MD Inpatient   07/23/2015 2253 07/29/2015 1607 Full Code [id number]  Vianne Bulls, MD ED   06/26/2015 2349 06/29/2015 1815 Full Code [id number]  Theressa Millard, MD Inpatient   04/26/2013 0628 04/29/2013 1847 Full Code [id number]  Bynum Bellows, MD ED    Advance Directive Documentation     Most Recent Value  Type of Advance Directive  Healthcare Power of Attorney, Living will  Pre-existing out of facility DNR order (yellow form or pink MOST form)  -  "MOST" Form in Place?  -       Prognosis:   Unable to determine  Discharge Planning:  Home with [place] was discussed with Dr. Zigmund Daniel  Thank you for allowing the Palliative Medicine Team to assist in the care of this patient.   Time In: 1015 Time Out: 1100 Total Time 45 min Prolonged Time Billed  no       Greater than 50%  of this time was spent counseling and coordinating care related to the above assessment and plan.  Dory Horn, NP  Please contact Palliative  Medicine Team phone at [phone] for questions and concerns.

## 2017-08-08 NOTE — Discharge Summary (Signed)
Physician Discharge Summary  Jose [name] NTI:[id number] DOB: 22-Aug-1955 DOA: 08/04/2017  PCP: [name], MD  Admit date: 08/04/2017 Discharge date: 08/08/2017  Admitted From:home Disposition:  home  Recommendations for Outpatient Follow-up:  1. Follow up with PCP in 1-2 weeks 2. Please obtain BMP/CBC in one week  Home Health:none Equipment/Devicesnone Discharge Conditionstable CODE STATUS full Diet recommendation: cardiac  Brief/Interim Summary:62 y.o. male with medical history significant of liver failure with ascites and esophageal/gastric varices; ETOH dependence; polysubstance abuse; thrombocytopenia; and liver mass presenting due to incidental finding of anemia.  Abdomijnal distention due to ascites.  He was vomiting brown emesis the other night, did not see bleeding.  He did have leakage from has navel and has a hernia.  Last ETOH was 2-3 weeks ago.  Sometimes he feels light-headed or dizzy, last time was a couple of weeks ago  Some occasional SOB.  Stools have been brown to dark brown, ocacsionally black.    Discharge Diagnoses:  Principal Problem:   Acute anemia Active Problems:   Thrombocytopenia ([place])   Alcoholism ([place])   Alcoholic cirrhosis of liver with ascites ([place])   Essential hypertension   Liver mass, right lobe   End stage liver disease (HCC)   Smoker   Polysubstance abuse ([place])   Upper GI bleed   Anemia   Decompensation of cirrhosis of liver (HCC)    Acute symptomatic anemia Drop of 3 g hemoglobin from baseline, improved with 2 unit PRBC .   EGD repeated showing nonbleeding duodenal erosions, no bleeding varices. Patient is currently stable.  (Hemoglobin 7.5 today)  Progressive right liver lesion. Recent MRI showing enlarging infiltrative lesion on the right lower liver measuring 4 x 5.5 cm.  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  Patient's AFP was 16 and prior biopsy of this lesion in 2017 was concerning for [facility] but was  nondiagnostic. Had scheduled liver biopsy on 4/9 but was held due to acute anemia.   IR consulted for biopsy but recommend given large volume ascites and abnormal labs patient is at high risk for bleeding with percutaneous biopsy and recommend to hold off on it until outpatient oncology evaluation.  GI discussed with oncology ( Dr Alen Blew) who recommends patient to be seen as outpatient.   Decompensated liver cirrhosis with ascites (HCC) paracentesis with 5 L clear yellow fluid removed on 4/9 and 6.3 removed 4/12 . Added Lasix and Aldactone.  Continue rifaximin.  Patient is not a candidate for liver transplant due to ongoing alcohol use.  Acute kidney injury ([place]) Suspect ATN versus ?hepatorenal syndrome. Renal ultrasound without acute findings.  Avoid nephrotoxins.   Continue to monitor with Lasix, Aldactone. renal functions improved today.  Thrombocytopenia ([place]) Secondary to decompensated cirrhosis.  Currently stable.  Ongoing alcohol use/polysubstance abuse Counseled strongly on cessation.  Still drinks 2-3 beers daily.  Urine drug screen is positive for cocaine.  No signs of withdrawal or encephalopathy. .  Right lower lobe nodule As seen on recent MRI of the abdomen.  Cannot obtain CT of the chest with contrast due to renal dysfunction.  If liver biopsy positive may consider PET scan as outpatient.  Hypervolemic hyponatremia Secondary to ascites.  Chronic.  Monitor for now.  Hypokalemia Replenished     Discharge Instructions  Discharge Instructions    Call MD for:  difficulty breathing, headache or visual disturbances   Complete by:  As directed    Call MD for:  persistant dizziness or light-headedness   Complete by:  As directed  Call MD for:  persistant nausea and vomiting   Complete by:  As directed    Call MD for:  severe uncontrolled pain   Complete by:  As directed    Call MD for:  temperature >100.4   Complete by:  As directed    Diet - low  sodium heart healthy   Complete by:  As directed    Increase activity slowly   Complete by:  As directed      Allergies as of 08/08/2017   No Known Allergies     Medication List    STOP taking these medications   OxyCODONE HCl (Abuse Deter) 5 MG Taba Commonly known as:  OXAYDO Replaced by:  oxyCODONE 5 MG immediate release tablet   triamterene-hydrochlorothiazide 37.5-25 MG tablet Commonly known as:  MAXZIDE-25     TAKE these medications   fluticasone 50 MCG/ACT nasal spray Commonly known as:  FLONASE Place 1 spray into both nostrils daily as needed for allergies.   furosemide 40 MG tablet Commonly known as:  LASIX Take 1 tablet (40 mg total) by mouth daily. Start taking on:  08/09/2017   hydroxypropyl methylcellulose / hypromellose 2.5 % ophthalmic solution Commonly known as:  ISOPTO TEARS / GONIOVISC Place 2 drops into both eyes 3 (three) times daily as needed for dry eyes.   lactulose 10 GM/15ML solution Commonly known as:  CHRONULAC Take 45 mLs (30 g total) by mouth 4 (four) times daily. What changed:    how much to take  when to take this   ondansetron 4 MG tablet Commonly known as:  ZOFRAN Take 1 tablet (4 mg total) by mouth every 6 (six) hours as needed for nausea.   oxyCODONE 5 MG immediate release tablet Commonly known as:  Oxy IR/ROXICODONE Take 1 tablet (5 mg total) by mouth 2 (two) times daily. Replaces:  OxyCODONE HCl (Abuse Deter) 5 MG Taba   pantoprazole 40 MG tablet Commonly known as:  PROTONIX Take 1 tablet (40 mg total) by mouth 2 (two) times daily.   PROAIR HFA 108 (90 Base) MCG/ACT inhaler Generic drug:  albuterol Inhale 2 puffs into the lungs 4 (four) times daily as needed for shortness of breath or wheezing.   rifaximin 550 MG Tabs tablet Commonly known as:  XIFAXAN Take 1 tablet (550 mg total) by mouth 2 (two) times daily.   spironolactone 50 MG tablet Commonly known as:  ALDACTONE Take 1 tablet (50 mg total) by mouth  daily. Start taking on:  08/09/2017   TAB-A-VITE Tabs Take 1 tablet by mouth daily.   Vitamin D-3 1000 units Caps Take 1,000 Units by mouth daily.      Follow-up Information    [name], MD Follow up.   Specialty:  Internal Medicine Contact information: [address] [place] [address] [phone]        Jackquline Denmark, MD Follow up.   Specialties:  Gastroenterology, Internal Medicine Contact information: [place]  [id number] [phone]          No Known Allergies  Consultations:  Gi dr Lyndel Safe    Procedures/Studies: Ct Head Wo Contrast  Result Date: 07/12/2017 CLINICAL DATA:  Patient with left upper extremity numbness. EXAM: CT HEAD WITHOUT CONTRAST TECHNIQUE: Contiguous axial images were obtained from the base of the skull through the vertex without intravenous contrast. COMPARISON:  Brain CT 08/14/2016. FINDINGS: Brain: Ventricles and sulci are prominent compatible with atrophy. Periventricular and subcortical white matter hypodensity compatible with  chronic microvascular ischemic changes. No evidence for acute cortically based infarct, intracranial hemorrhage, mass lesion or mass-effect. Vascular: Unremarkable. Skull: Intact. Sinuses/Orbits: Paranasal sinuses are well aerated. Mastoid air cells unremarkable. Orbits unremarkable. Chronic deformity of the medial orbital walls bilaterally. Other: None. IMPRESSION: No acute intracranial process. Atrophy and chronic microvascular ischemic changes. Electronically Signed   By: Lovey Newcomer M.D.   On: 07/12/2017 12:46   US Abdomen Complete  Result Date: 07/12/2017 CLINICAL DATA:  Patient with history of hepatitis C, cirrhosis. Hypertension. Elevated LFTs. EXAM: ABDOMEN ULTRASOUND COMPLETE COMPARISON:  Ultrasound 03/27/2017. FINDINGS: Gallbladder: Sludge within the gallbladder lumen. Gallbladder wall thickening measuring up to 3.6 mm. Negative sonographic Murphy's sign. Common bile  duct: Diameter: 4 mm Liver: The liver is heterogeneous in echogenicity and nodular in contour. Within the right hepatic lobe there is a 5.9 x 6.5 x 5.2 cm mixed echogenicity mass, previously measuring 4.4 x 3.9 x 4.2 cm. 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 IVC: No abnormality visualized. Pancreas: Visualized portion unremarkable. Spleen: Size and appearance within normal limits. Right Kidney: Length: 11.5 cm. Echogenicity within normal limits. No mass or hydronephrosis visualized. Left Kidney: Length: 12.2 cm. Echogenicity within normal limits. No mass or hydronephrosis visualized. Abdominal aorta: No aneurysm visualized. Other findings: None. IMPRESSION: 1. Interval increase in size of mixed echogenicity mass within the right hepatic lobe, potentially representing hepatocellular carcinoma. 2. Stones and sludge within the gallbladder lumen with associated wall thickening. Wall thickening is nonspecific in the setting of cirrhosis. 3. Cirrhosis. 4. 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 Electronically Signed   By: Lovey Newcomer M.D.   On: 07/12/2017 12:42   US Scrotum  Result Date: 08/05/2017 CLINICAL DATA:  Scrotal edema EXAM: ULTRASOUND OF SCROTUM TECHNIQUE: Complete ultrasound examination of the testicles, epididymis, and other scrotal structures was performed. COMPARISON:  CT 05/20/2016, ultrasound scrotum 05/20/2016 FINDINGS: Right testicle Measurements: 4.6 x 1.2 x 2.7 cm. No mass or microlithiasis visualized. Displaced anteriorly by large scrotal fluid collection. Left testicle Measurements: 4.4 x 2.4 x 1.8 cm. No mass or microlithiasis visualized. Right epididymis:  Normal in size and appearance. Left epididymis:  Normal in size and appearance. Hydrocele: Very large septated, mildly complex right hydrocele. Fluid in the right scrotum measures at least 20.7 cm. Varicocele:  Small bilateral varicoceles. IMPRESSION: 1. Large septated/slightly complex right scrotal fluid  collection measuring at least 20.7 cm. This is increased compared to the prior ultrasound. No definite herniated bowel loops at the time of imaging. 2. Bilateral varicoceles Electronically Signed   By: Donavan Foil M.D.   On: 08/05/2017 16:16   Mr Liver W KK Contrast  Result Date: 07/21/2017 CLINICAL DATA:  Hepatitis-C.  Cirrhosis.  Hemochromatosis. EXAM: MRI ABDOMEN WITHOUT AND WITH CONTRAST TECHNIQUE: Multiplanar multisequence MR imaging of the abdomen was performed both before and after the administration of intravenous contrast. CONTRAST:  72mL MULTIHANCE GADOBENATE DIMEGLUMINE 529 MG/ML IV SOLN COMPARISON:  07/12/2017 ultrasound.  MRI 08/05/2016. FINDINGS: Moderate motion degradation throughout. Lower chest: New small bilateral pleural effusions. Normal heart size. Right lower lobe pulmonary nodules on the order of 3 mm x 2 are new since the prior MRI and CT. There is probable atelectasis at the left lung base. Periesophageal varices. Hepatobiliary: Cirrhosis. Probable cyst in the left hepatic lobe. Significant enlargement of a partially branching, precontrast T1 hyperintense, arterial phase hyperenhancing, delayed hypoenhancing liver lesion. This may extend minimally into the medial segment left liver lobe. Centered in segments 6 and 7. Superiorly, this measures on  the order of 4.0 x 5.5 cm on image [id number]. More inferiorly, contiguous rounded portion measures 4.3 x 4.6 cm on image 53. Gallbladder wall thickening is again identified and nonspecific in the setting of cirrhosis. No biliary duct dilatation. Pancreas:  Grossly normal period without duct dilatation. Spleen:  Presumed siderotic nodules within the medial spleen. Adrenals/Urinary Tract: Normal adrenal glands. Normal kidneys, without hydronephrosis. Stomach/Bowel: Grossly normal stomach and abdominal bowel loops. Vascular/Lymphatic: Portal venous hypertension, with recannulized periumbilical vein and gastric varices. Patent main portal vein.  Right portal vein not well evaluated. No gross abdominal adenopathy. Other:  Increase in moderate volume ascites. Musculoskeletal: No acute osseous abnormality. IMPRESSION: 1. Moderately motion degraded exam. 2. Significant enlargement of a heterogeneous, infiltrative lesion centered in the right lobe of the liver. This is favored to represent progressive hepatocellular carcinoma. Imaging findings are not diagnostic, however given degradation of today's exam and the somewhat unusual lesion morphology. 3. Right portal vein not well evaluated, and tumor involvement cannot be excluded. Liver protocol CT may be informative given limitation of current exam. 4. Cirrhosis and portal venous hypertension with increased ascites and development of small bilateral pleural effusions. 5. New right lower lobe nodularity. If the patient is diagnosed with hepatocellular carcinoma, consider dedicated chest CT for staging. Electronically Signed   By: Abigail Miyamoto M.D.   On: 07/21/2017 10:53   US Renal  Result Date: 08/05/2017 CLINICAL DATA:  Acute kidney injury, hypertension EXAM: RENAL / URINARY TRACT ULTRASOUND COMPLETE COMPARISON:  Abdomen complete ultrasound 07/12/2017 FINDINGS: Right Kidney: Length: 12.4 cm. Normal cortical thickness. Upper normal cortical echogenicity. No mass, hydronephrosis or shadowing calcification. Left Kidney: Length: 11.6 cm. Normal cortical thickness. Upper normal cortical echogenicity. No mass, hydronephrosis or shadowing calcification. Bladder: Bladder unremarkable.  Prostate gland minimally prominent. Moderate ascites. IMPRESSION: No definite urinary tract abnormalities. Moderate ascites. Electronically Signed   By: Lavonia Dana M.D.   On: 08/05/2017 15:17   Ir Paracentesis  Result Date: 08/07/2017 INDICATION: Cirrhosis with history of hepatitis C and alcohol abuse. Ascites. Request for diagnostic and therapeutic paracentesis. EXAM: ULTRASOUND GUIDED RIGHT LOWER QUADRANT PARACENTESIS  MEDICATIONS: None. COMPLICATIONS: None immediate. PROCEDURE: Informed written consent was obtained from the patient after a discussion of the risks, benefits and alternatives to treatment. A timeout was performed prior to the initiation of the procedure. Initial ultrasound scanning demonstrates a large amount of ascites within the right lower abdominal quadrant. The right lower abdomen was prepped and draped in the usual sterile fashion. 1% lidocaine with epinephrine was used for local anesthesia. Following this, a 19 gauge, 7-cm, Yueh catheter was introduced. An ultrasound image was saved for documentation purposes. The paracentesis was performed. The catheter was removed and a dressing was applied. The patient tolerated the procedure well without immediate post procedural complication. FINDINGS: A total of approximately 6.3 L of clear yellow fluid was removed. Samples were sent to the laboratory as requested by the clinical team. IMPRESSION: Successful ultrasound-guided paracentesis yielding 6.3 liters of peritoneal fluid. Read by: Ascencion Dike PA-C Electronically Signed   By: Corrie Mckusick D.O.   On: 08/07/2017 13:56   Ir Paracentesis  Result Date: 08/04/2017 INDICATION: Patient with alcoholic cirrhosis, hep C, enlarging liver lesion, ascites. Request is made for therapeutic paracentesis. EXAM: ULTRASOUND GUIDED THERAPEUTIC PARACENTESIS MEDICATIONS: 10 mL 2% lidocaine COMPLICATIONS: None immediate. PROCEDURE: Informed written consent was obtained from the patient after a discussion of the risks, benefits and alternatives to treatment. A timeout was performed prior to the initiation of the  procedure. Initial ultrasound scanning demonstrates a large amount of ascites within the right lower abdominal quadrant. The right lower abdomen was prepped and draped in the usual sterile fashion. 2% lidocaine was used for local anesthesia. Following this, a 19 gauge, 7-cm, Yueh catheter was introduced. An ultrasound image  was saved for documentation purposes. The paracentesis was performed. The catheter was removed and a dressing was applied. The patient tolerated the procedure well without immediate post procedural complication. FINDINGS: A total of approximately 5.0 liters of clear, yellow fluid was removed. IMPRESSION: Successful ultrasound-guided therapeutic paracentesis yielding 5.0 liters of peritoneal fluid. Read by: Brynda Greathouse PA-C Electronically Signed   By: Jerilynn Mages.  Shick M.D.   On: 08/04/2017 13:37    (Echo, Carotid, EGD, Colonoscopy, ERCP)    Subjective:   Discharge Exam: Vitals:   08/07/17 2204 08/08/17 0506  BP: 104/64 108/68  Pulse: 65 65  Resp: 18 18  Temp: 98.3 F (36.8 C) 98.3 F (36.8 C)  SpO2: 100% 100%   Vitals:   08/07/17 1100 08/07/17 1352 08/07/17 2204 08/08/17 0506  BP: 120/75 111/73 104/64 108/68  Pulse: 61  65 65  Resp:   18 18  Temp: 98.4 F (36.9 C)  98.3 F (36.8 C) 98.3 F (36.8 C)  TempSrc: Oral  Oral Oral  SpO2: 99%  100% 100%  Weight:      Height:        General: Pt is alert, awake, not in acute distress Cardiovascular: RRR, S1/S2 +, no rubs, no gallops Respiratory: CTA bilaterally, no wheezing, no rhonchi Abdominal: Soft, NT, ND, bowel sounds + Extremities: no edema, no cyanosis    The results of significant diagnostics from this hospitalization (including imaging, microbiology, ancillary and laboratory) are listed below for reference.     Microbiology: No results found for this or any previous visit (from the past 240 hour(s)).   Labs: BNP (last 3 results) No results for input(s): BNP in the last 8760 hours. Basic Metabolic Panel: Recent Labs  Lab 08/04/17 1859 08/05/17 0618 08/06/17 0445 08/07/17 0649 08/08/17 0521  NA 128* 127* 128* 130* 129*  K 3.3* 3.6 3.4* 3.5 3.5  CL 99* 98* 99* 99* 100*  CO2 20* 19* 21* 22 20*  GLUCOSE 110* 131* 146* 123* 110*  BUN 60* 54* 46* 41* 35*  CREATININE 2.18* 1.95* 2.06* 2.02* 1.78*  CALCIUM 7.7*  7.7* 7.4* 7.8* 7.9*   Liver Function Tests: Recent Labs  Lab 08/04/17 1859 08/06/17 0445 08/07/17 0649 08/08/17 0521  AST 106* 76* 68* 64*  ALT 111* 82* 65* 54  ALKPHOS 144* 158* 161* 136*  BILITOT 3.8* 3.0* 2.4* 2.8*  PROT 5.7* 5.0* 5.2* 5.6*  ALBUMIN 2.3* 2.0* 2.5* 2.6*   No results for input(s): LIPASE, AMYLASE in the last 168 hours. No results for input(s): AMMONIA in the last 168 hours. CBC: Recent Labs  Lab 08/05/17 0043 08/05/17 0618 08/06/17 0445 08/07/17 0649 08/08/17 0521  WBC 6.0 5.2 5.7 4.6 5.1  HGB 7.7* 7.9* 8.0* 7.5* 7.5*  HCT 23.1* 23.4* 23.7* 22.4* 23.0*  MCV 88.5 89.3 88.8 88.9 87.8  PLT 100* 99* 102* 90* 86*   Cardiac Enzymes: No results for input(s): CKTOTAL, CKMB, CKMBINDEX, TROPONINI in the last 168 hours. BNP: Invalid input(s): POCBNP CBG: No results for input(s): GLUCAP in the last 168 hours. D-Dimer No results for input(s): DDIMER in the last 72 hours. Hgb A1c No results for input(s): HGBA1C in the last 72 hours. Lipid Profile No results for input(s): CHOL,  HDL, LDLCALC, TRIG, CHOLHDL, LDLDIRECT in the last 72 hours. Thyroid function studies No results for input(s): TSH, T4TOTAL, T3FREE, THYROIDAB in the last 72 hours.  Invalid input(s): FREET3 Anemia work up No results for input(s): VITAMINB12, FOLATE, FERRITIN, TIBC, IRON, RETICCTPCT in the last 72 hours. Urinalysis    Component Value Date/Time   COLORURINE YELLOW 08/05/2017 1406   APPEARANCEUR CLEAR 08/05/2017 1406   LABSPEC 1.006 08/05/2017 1406   PHURINE 5.0 08/05/2017 1406   GLUCOSEU NEGATIVE 08/05/2017 1406   HGBUR NEGATIVE 08/05/2017 1406   [place] 08/05/2017 1406   KETONESUR NEGATIVE 08/05/2017 1406   PROTEINUR NEGATIVE 08/05/2017 1406   UROBILINOGEN 4.0 (H) 04/26/2013 0500   NITRITE NEGATIVE 08/05/2017 1406   LEUKOCYTESUR NEGATIVE 08/05/2017 1406   Sepsis Labs Invalid input(s): PROCALCITONIN,  WBC,  LACTICIDVEN Microbiology No results found for this or  any previous visit (from the past 240 hour(s)).   Time coordinating discharge: Over 30 minutes  SIGNED:   Georgette Shell, MD  [facility] 08/08/2017, 11:40 AM Pager   If 7PM-7AM, please contact night-coverage [email or web address] Password [email or web address]

## 2017-08-08 NOTE — Progress Notes (Signed)
Nsg Discharge Note  Admit Date:  08/04/2017 Discharge date: 08/08/2017   Rolland Porter Marchi to be D/C'd Home per MD order.  AVS completed.  Copy for chart, and copy for patient signed, and dated. Patient/caregiver able to verbalize understanding.  Discharge Medication: Allergies as of 08/08/2017   No Known Allergies     Medication List    STOP taking these medications   OxyCODONE HCl (Abuse Deter) 5 MG Taba Commonly known as:  OXAYDO Replaced by:  oxyCODONE 5 MG immediate release tablet   triamterene-hydrochlorothiazide 37.5-25 MG tablet Commonly known as:  MAXZIDE-25     TAKE these medications   fluticasone 50 MCG/ACT nasal spray Commonly known as:  FLONASE Place 1 spray into both nostrils daily as needed for allergies.   furosemide 40 MG tablet Commonly known as:  LASIX Take 1 tablet (40 mg total) by mouth daily. Start taking on:  08/09/2017   hydroxypropyl methylcellulose / hypromellose 2.5 % ophthalmic solution Commonly known as:  ISOPTO TEARS / GONIOVISC Place 2 drops into both eyes 3 (three) times daily as needed for dry eyes.   lactulose 10 GM/15ML solution Commonly known as:  CHRONULAC Take 45 mLs (30 g total) by mouth 4 (four) times daily. What changed:    how much to take  when to take this   ondansetron 4 MG tablet Commonly known as:  ZOFRAN Take 1 tablet (4 mg total) by mouth every 6 (six) hours as needed for nausea.   oxyCODONE 5 MG immediate release tablet Commonly known as:  Oxy IR/ROXICODONE Take 1 tablet (5 mg total) by mouth 2 (two) times daily. Replaces:  OxyCODONE HCl (Abuse Deter) 5 MG Taba   pantoprazole 40 MG tablet Commonly known as:  PROTONIX Take 1 tablet (40 mg total) by mouth 2 (two) times daily.   PROAIR HFA 108 (90 Base) MCG/ACT inhaler Generic drug:  albuterol Inhale 2 puffs into the lungs 4 (four) times daily as needed for shortness of breath or wheezing.   rifaximin 550 MG Tabs tablet Commonly known as:  XIFAXAN Take 1 tablet  (550 mg total) by mouth 2 (two) times daily.   spironolactone 50 MG tablet Commonly known as:  ALDACTONE Take 1 tablet (50 mg total) by mouth daily. Start taking on:  08/09/2017   TAB-A-VITE Tabs Take 1 tablet by mouth daily.   Vitamin D-3 1000 units Caps Take 1,000 Units by mouth daily.       Discharge Assessment: Vitals:   08/07/17 2204 08/08/17 0506  BP: 104/64 108/68  Pulse: 65 65  Resp: 18 18  Temp: 98.3 F (36.8 C) 98.3 F (36.8 C)  SpO2: 100% 100%   Skin clean, dry and intact without evidence of skin break down, no evidence of skin tears noted. IV catheter discontinued intact. Site without signs and symptoms of complications - no redness or edema noted at insertion site, patient denies c/o pain - only slight tenderness at site.  Dressing with slight pressure applied.  D/c Instructions-Education: Discharge instructions given to patient/family with verbalized understanding. D/c education completed with patient/family including follow up instructions, medication list, d/c activities limitations if indicated, with other d/c instructions as indicated by MD - patient able to verbalize understanding, all questions fully answered. Patient instructed to return to ED, call 911, or call MD for any changes in condition.  Patient escorted via [place], and D/C home via private auto.  Salley Slaughter, RN 08/08/2017 12:13 PM

## 2017-08-10 ENCOUNTER — Ambulatory Visit: Payer: Medicaid Other | Admitting: Hematology

## 2017-08-10 ENCOUNTER — Telehealth: Payer: Self-pay | Admitting: *Deleted

## 2017-08-10 MED FILL — PANTOPRAZOLE SOD DR 40 MG T: 40 | 30 days supply | Qty: 60 | Fill #0

## 2017-08-10 MED FILL — FUROSEMIDE 40 MG TAB: 40 | 30 days supply | Qty: 30 | Fill #0

## 2017-08-10 MED FILL — ONDANSETRON HCL 4 MG TABLET: 4 | 5 days supply | Qty: 20 | Fill #0

## 2017-08-10 MED FILL — SPIRONOLACTONE 50 MG TAB: 50 | 30 days supply | Qty: 30 | Fill #0

## 2017-08-10 MED FILL — LACTULOSE Solution 10g/15ml: 10 | 29 days supply | Qty: 4050 | Fill #1

## 2017-08-10 NOTE — Telephone Encounter (Signed)
Attempted to call pt unsuccessful.  Unable to leave message due to voice mail not accepting call.  Called sister [name] and left message on voice mail for cancelling pt's appt today with Dr. Burr Medico.   Per Dr. Burr Medico, pt had seen Dr. Alvy Bimler in the past.  Dr. Alvy Bimler agreed to see pt again.  Per Dr. Burr Medico, Dr. Calton Dach scheduler will contact pt with new appt with the provider.

## 2017-08-11 ENCOUNTER — Telehealth: Payer: Self-pay | Admitting: Hematology and Oncology

## 2017-08-11 NOTE — Telephone Encounter (Signed)
Tried calling patient with new appt date / time and voicemail was full unable to leave message. Letter/Calendar mailed to patient per 4/15 sch msg

## 2017-08-12 MED FILL — XIFAXAN 550 MG TABLET: 550 | 30 days supply | Qty: 60 | Fill #4

## 2017-08-12 MED FILL — TRIAMTERENE-HCTZ 37.5-25 MG: 37.5-25 | 30 days supply | Qty: 30 | Fill #0

## 2017-08-13 LAB — CULTURE, BODY FLUID W GRAM STAIN -BOTTLE: Culture: NO GROWTH

## 2017-08-13 LAB — CULTURE, BODY FLUID-BOTTLE

## 2017-08-19 ENCOUNTER — Encounter: Payer: Self-pay | Admitting: Gastroenterology

## 2017-08-22 ENCOUNTER — Other Ambulatory Visit: Payer: Self-pay

## 2017-08-22 ENCOUNTER — Inpatient Hospital Stay (HOSPITAL_COMMUNITY)
Admission: EM | Admit: 2017-08-22 | Discharge: 2017-08-28 | DRG: 682 | Disposition: A | Discharge status: Home Health | Payer: Medicaid Other | Attending: Family Medicine | Admitting: Family Medicine

## 2017-08-22 ENCOUNTER — Emergency Department (HOSPITAL_COMMUNITY): Payer: Medicaid Other

## 2017-08-22 ENCOUNTER — Inpatient Hospital Stay (HOSPITAL_COMMUNITY): Payer: Medicaid Other

## 2017-08-22 ENCOUNTER — Encounter (HOSPITAL_COMMUNITY): Payer: Self-pay

## 2017-08-22 DIAGNOSIS — K2950 Unspecified chronic gastritis without bleeding: Secondary | ICD-10-CM | POA: Diagnosis present

## 2017-08-22 DIAGNOSIS — Z6826 Body mass index (BMI) 26.0-26.9, adult: Secondary | ICD-10-CM | POA: Diagnosis not present

## 2017-08-22 DIAGNOSIS — E871 Hypo-osmolality and hyponatremia: Secondary | ICD-10-CM | POA: Diagnosis present

## 2017-08-22 DIAGNOSIS — E43 Unspecified severe protein-calorie malnutrition: Secondary | ICD-10-CM | POA: Diagnosis present

## 2017-08-22 DIAGNOSIS — F1020 Alcohol dependence, uncomplicated: Secondary | ICD-10-CM | POA: Diagnosis present

## 2017-08-22 DIAGNOSIS — K7210 Chronic hepatic failure without coma: Secondary | ICD-10-CM | POA: Diagnosis present

## 2017-08-22 DIAGNOSIS — E876 Hypokalemia: Secondary | ICD-10-CM | POA: Diagnosis not present

## 2017-08-22 DIAGNOSIS — H9190 Unspecified hearing loss, unspecified ear: Secondary | ICD-10-CM | POA: Diagnosis present

## 2017-08-22 DIAGNOSIS — R160 Hepatomegaly, not elsewhere classified: Secondary | ICD-10-CM | POA: Diagnosis present

## 2017-08-22 DIAGNOSIS — Z6833 Body mass index (BMI) 33.0-33.9, adult: Secondary | ICD-10-CM

## 2017-08-22 DIAGNOSIS — D696 Thrombocytopenia, unspecified: Secondary | ICD-10-CM | POA: Diagnosis present

## 2017-08-22 DIAGNOSIS — N179 Acute kidney failure, unspecified: Secondary | ICD-10-CM | POA: Diagnosis present

## 2017-08-22 DIAGNOSIS — D61818 Other pancytopenia: Secondary | ICD-10-CM | POA: Diagnosis present

## 2017-08-22 DIAGNOSIS — K7031 Alcoholic cirrhosis of liver with ascites: Secondary | ICD-10-CM | POA: Diagnosis present

## 2017-08-22 DIAGNOSIS — K7290 Hepatic failure, unspecified without coma: Secondary | ICD-10-CM | POA: Diagnosis not present

## 2017-08-22 DIAGNOSIS — I10 Essential (primary) hypertension: Secondary | ICD-10-CM | POA: Diagnosis present

## 2017-08-22 DIAGNOSIS — D638 Anemia in other chronic diseases classified elsewhere: Secondary | ICD-10-CM | POA: Diagnosis present

## 2017-08-22 DIAGNOSIS — R7989 Other specified abnormal findings of blood chemistry: Secondary | ICD-10-CM | POA: Diagnosis present

## 2017-08-22 DIAGNOSIS — N170 Acute kidney failure with tubular necrosis: Principal | ICD-10-CM | POA: Diagnosis present

## 2017-08-22 DIAGNOSIS — K2970 Gastritis, unspecified, without bleeding: Secondary | ICD-10-CM | POA: Diagnosis present

## 2017-08-22 DIAGNOSIS — Z9111 Patient's noncompliance with dietary regimen: Secondary | ICD-10-CM

## 2017-08-22 DIAGNOSIS — R601 Generalized edema: Secondary | ICD-10-CM

## 2017-08-22 DIAGNOSIS — R0600 Dyspnea, unspecified: Secondary | ICD-10-CM | POA: Diagnosis not present

## 2017-08-22 DIAGNOSIS — R64 Cachexia: Secondary | ICD-10-CM | POA: Diagnosis present

## 2017-08-22 DIAGNOSIS — D649 Anemia, unspecified: Secondary | ICD-10-CM | POA: Diagnosis present

## 2017-08-22 DIAGNOSIS — K7682 Hepatic encephalopathy: Secondary | ICD-10-CM

## 2017-08-22 DIAGNOSIS — B182 Chronic viral hepatitis C: Secondary | ICD-10-CM | POA: Diagnosis present

## 2017-08-22 DIAGNOSIS — Z79899 Other long term (current) drug therapy: Secondary | ICD-10-CM

## 2017-08-22 DIAGNOSIS — F1910 Other psychoactive substance abuse, uncomplicated: Secondary | ICD-10-CM | POA: Diagnosis present

## 2017-08-22 DIAGNOSIS — F17210 Nicotine dependence, cigarettes, uncomplicated: Secondary | ICD-10-CM | POA: Diagnosis present

## 2017-08-22 DIAGNOSIS — N5089 Other specified disorders of the male genital organs: Secondary | ICD-10-CM | POA: Diagnosis present

## 2017-08-22 DIAGNOSIS — E8770 Fluid overload, unspecified: Secondary | ICD-10-CM | POA: Diagnosis present

## 2017-08-22 DIAGNOSIS — R945 Abnormal results of liver function studies: Secondary | ICD-10-CM

## 2017-08-22 DIAGNOSIS — K7040 Alcoholic hepatic failure without coma: Secondary | ICD-10-CM | POA: Diagnosis present

## 2017-08-22 DIAGNOSIS — R0602 Shortness of breath: Secondary | ICD-10-CM

## 2017-08-22 DIAGNOSIS — I864 Gastric varices: Secondary | ICD-10-CM | POA: Diagnosis present

## 2017-08-22 DIAGNOSIS — K219 Gastro-esophageal reflux disease without esophagitis: Secondary | ICD-10-CM | POA: Diagnosis present

## 2017-08-22 DIAGNOSIS — Z515 Encounter for palliative care: Secondary | ICD-10-CM | POA: Diagnosis not present

## 2017-08-22 DIAGNOSIS — R140 Abdominal distension (gaseous): Secondary | ICD-10-CM

## 2017-08-22 DIAGNOSIS — Z7189 Other specified counseling: Secondary | ICD-10-CM | POA: Diagnosis not present

## 2017-08-22 DIAGNOSIS — R188 Other ascites: Secondary | ICD-10-CM

## 2017-08-22 LAB — COMPREHENSIVE METABOLIC PANEL
ALBUMIN: 2.5 g/dL — AB (ref 3.5–5.0)
ALT: 54 U/L (ref 17–63)
ANION GAP: 13 (ref 5–15)
AST: 91 U/L — ABNORMAL HIGH (ref 15–41)
Alkaline Phosphatase: 133 U/L — ABNORMAL HIGH (ref 38–126)
BILIRUBIN TOTAL: 3.4 mg/dL — AB (ref 0.3–1.2)
BUN: 32 mg/dL — ABNORMAL HIGH (ref 6–20)
CHLORIDE: 102 mmol/L (ref 101–111)
CO2: 17 mmol/L — AB (ref 22–32)
Calcium: 8.1 mg/dL — ABNORMAL LOW (ref 8.9–10.3)
Creatinine, Ser: 3.8 mg/dL — ABNORMAL HIGH (ref 0.61–1.24)
GFR calc Af Amer: 18 mL/min — ABNORMAL LOW (ref >60)
GFR calc non Af Amer: 16 mL/min — ABNORMAL LOW (ref >60)
Glucose, Bld: 121 mg/dL — ABNORMAL HIGH (ref 65–99)
POTASSIUM: 3.7 mmol/L (ref 3.5–5.1)
SODIUM: 132 mmol/L — AB (ref 135–145)
TOTAL PROTEIN: 5.6 g/dL — AB (ref 6.5–8.1)

## 2017-08-22 LAB — PHOSPHORUS: Phosphorus: 4.1 mg/dL (ref 2.5–4.6)

## 2017-08-22 LAB — TSH: TSH: 3.306 u[IU]/mL (ref 0.350–4.500)

## 2017-08-22 LAB — CBC WITH DIFFERENTIAL/PLATELET
BASOS ABS: 0 10*3/uL (ref 0.0–0.1)
BASOS PCT: 1 %
EOS ABS: 0.2 10*3/uL (ref 0.0–0.7)
EOS PCT: 4 %
HCT: 26.1 % — ABNORMAL LOW (ref 39.0–52.0)
Hemoglobin: 8.4 g/dL — ABNORMAL LOW (ref 13.0–17.0)
Lymphocytes Relative: 20 %
Lymphs Abs: 0.8 10*3/uL (ref 0.7–4.0)
MCH: 27.5 pg (ref 26.0–34.0)
MCHC: 32.2 g/dL (ref 30.0–36.0)
MCV: 85.6 fL (ref 78.0–100.0)
MONO ABS: 0.4 10*3/uL (ref 0.1–1.0)
MONOS PCT: 10 %
Neutro Abs: 2.7 10*3/uL (ref 1.7–7.7)
Neutrophils Relative %: 65 %
PLATELETS: 72 10*3/uL — AB (ref 150–400)
RBC: 3.05 MIL/uL — ABNORMAL LOW (ref 4.22–5.81)
RDW: 18.6 % — AB (ref 11.5–15.5)
WBC: 4.1 10*3/uL (ref 4.0–10.5)

## 2017-08-22 LAB — URINALYSIS, ROUTINE W REFLEX MICROSCOPIC
Bilirubin Urine: NEGATIVE
Glucose, UA: NEGATIVE mg/dL
HGB URINE DIPSTICK: NEGATIVE
Ketones, ur: NEGATIVE mg/dL
Leukocytes, UA: NEGATIVE
NITRITE: NEGATIVE
PROTEIN: NEGATIVE mg/dL
Specific Gravity, Urine: 1.008 (ref 1.005–1.030)
pH: 5 (ref 5.0–8.0)

## 2017-08-22 LAB — APTT: APTT: 39 s — AB (ref 24–36)

## 2017-08-22 LAB — MAGNESIUM: Magnesium: 1.9 mg/dL (ref 1.7–2.4)

## 2017-08-22 LAB — PROTIME-INR
INR: 1.57
Prothrombin Time: 18.6 seconds — ABNORMAL HIGH (ref 11.4–15.2)

## 2017-08-22 LAB — SODIUM, URINE, RANDOM: SODIUM UR: 95 mmol/L

## 2017-08-22 LAB — RAPID URINE DRUG SCREEN, HOSP PERFORMED
AMPHETAMINES: NOT DETECTED
Barbiturates: NOT DETECTED
Benzodiazepines: NOT DETECTED
COCAINE: POSITIVE — AB
OPIATES: NOT DETECTED
Tetrahydrocannabinol: NOT DETECTED

## 2017-08-22 LAB — CREATININE, URINE, RANDOM: CREATININE, URINE: 75.01 mg/dL

## 2017-08-22 LAB — ETHANOL: Alcohol, Ethyl (B): <10 mg/dL (ref <10)

## 2017-08-22 LAB — OSMOLALITY, URINE: Osmolality, Ur: 338 mOsm/kg (ref 300–900)

## 2017-08-22 MED ORDER — VITAMIN B-1 100 MG PO TABS
100.0000 mg | ORAL_TABLET | Freq: Every day | ORAL | Status: DC
  Administered 2017-08-22 – 2017-08-28 (×6): 100 mg via ORAL
  Filled 2017-08-22 (×7): qty 1

## 2017-08-22 MED ORDER — ONDANSETRON HCL 4 MG PO TABS
4.0000 mg | ORAL_TABLET | Freq: Four times a day (QID) | ORAL | Status: DC | PRN
Start: 2017-08-22 — End: 2017-08-28

## 2017-08-22 MED ORDER — TAB-A-VITE PO TABS: 1.0000 | ORAL_TABLET | Freq: Every day | ORAL | Status: DC

## 2017-08-22 MED ORDER — FUROSEMIDE 10 MG/ML IJ SOLN
40.0000 mg | Freq: Two times a day (BID) | INTRAMUSCULAR | Status: DC
  Administered 2017-08-22 – 2017-08-25 (×7): 40 mg via INTRAVENOUS
  Filled 2017-08-22 (×7): qty 4

## 2017-08-22 MED ORDER — SPIRONOLACTONE 25 MG PO TABS
50.0000 mg | ORAL_TABLET | Freq: Every day | ORAL | Status: DC
  Administered 2017-08-23 – 2017-08-28 (×6): 50 mg via ORAL
  Filled 2017-08-22 (×6): qty 2

## 2017-08-22 MED ORDER — LORAZEPAM 2 MG/ML IJ SOLN: 0.0000 mg | Freq: Four times a day (QID) | INTRAMUSCULAR | Status: AC

## 2017-08-22 MED ORDER — POLYVINYL ALCOHOL 1.4 % OP SOLN
2.0000 [drp] | Freq: Three times a day (TID) | OPHTHALMIC | Status: DC | PRN
  Filled 2017-08-22: qty 15

## 2017-08-22 MED ORDER — SENNOSIDES-DOCUSATE SODIUM 8.6-50 MG PO TABS: 1.0000 | ORAL_TABLET | Freq: Every evening | ORAL | Status: DC | PRN

## 2017-08-22 MED ORDER — VITAMIN D3 25 MCG (1000 UNIT) PO TABS
1000.0000 [IU] | ORAL_TABLET | Freq: Every day | ORAL | Status: DC
  Administered 2017-08-23 – 2017-08-28 (×6): 1000 [IU] via ORAL
  Filled 2017-08-22 (×6): qty 1

## 2017-08-22 MED ORDER — FUROSEMIDE 10 MG/ML IJ SOLN
40.0000 mg | Freq: Once | INTRAMUSCULAR | Status: AC
  Administered 2017-08-22: 40 mg via INTRAVENOUS
  Filled 2017-08-22: qty 4

## 2017-08-22 MED ORDER — OXYCODONE-ACETAMINOPHEN 5-325 MG PO TABS
1.0000 | ORAL_TABLET | ORAL | Status: DC | PRN
  Administered 2017-08-22: 1 via ORAL
  Filled 2017-08-22: qty 1

## 2017-08-22 MED ORDER — ACETAMINOPHEN 650 MG RE SUPP: 650.0000 mg | Freq: Four times a day (QID) | RECTAL | Status: DC | PRN

## 2017-08-22 MED ORDER — ONDANSETRON HCL 4 MG/2ML IJ SOLN
4.0000 mg | Freq: Four times a day (QID) | INTRAMUSCULAR | Status: DC | PRN
Start: 2017-08-22 — End: 2017-08-28
  Administered 2017-08-28: 4 mg via INTRAVENOUS
  Filled 2017-08-22: qty 2

## 2017-08-22 MED ORDER — VITAMIN D-3 25 MCG (1000 UT) PO CAPS: 1000.0000 [IU] | ORAL_CAPSULE | Freq: Every day | ORAL | Status: DC

## 2017-08-22 MED ORDER — FLUTICASONE PROPIONATE 50 MCG/ACT NA SUSP
1.0000 | Freq: Every day | NASAL | Status: DC | PRN
  Filled 2017-08-22: qty 16

## 2017-08-22 MED ORDER — LORAZEPAM 2 MG/ML IJ SOLN: 1.0000 mg | Freq: Four times a day (QID) | INTRAMUSCULAR | Status: AC | PRN

## 2017-08-22 MED ORDER — LORAZEPAM 2 MG/ML IJ SOLN: 0.0000 mg | Freq: Two times a day (BID) | INTRAMUSCULAR | Status: AC

## 2017-08-22 MED ORDER — RIFAXIMIN 550 MG PO TABS
550.0000 mg | ORAL_TABLET | Freq: Two times a day (BID) | ORAL | Status: DC
  Administered 2017-08-22 – 2017-08-28 (×11): 550 mg via ORAL
  Filled 2017-08-22 (×13): qty 1

## 2017-08-22 MED ORDER — ADULT MULTIVITAMIN W/MINERALS CH: 1.0000 | ORAL_TABLET | Freq: Every day | ORAL | Status: DC

## 2017-08-22 MED ORDER — FOLIC ACID 1 MG PO TABS
1.0000 mg | ORAL_TABLET | Freq: Every day | ORAL | Status: DC
  Administered 2017-08-22 – 2017-08-28 (×7): 1 mg via ORAL
  Filled 2017-08-22 (×7): qty 1

## 2017-08-22 MED ORDER — OXYCODONE HCL 5 MG PO TABS
5.0000 mg | ORAL_TABLET | Freq: Two times a day (BID) | ORAL | Status: DC
  Administered 2017-08-22 – 2017-08-24 (×5): 5 mg via ORAL
  Filled 2017-08-22 (×5): qty 1

## 2017-08-22 MED ORDER — NICOTINE 14 MG/24HR TD PT24
14.0000 mg | MEDICATED_PATCH | Freq: Every day | TRANSDERMAL | Status: DC
  Administered 2017-08-23 – 2017-08-28 (×6): 14 mg via TRANSDERMAL
  Filled 2017-08-22 (×7): qty 1

## 2017-08-22 MED ORDER — ADULT MULTIVITAMIN W/MINERALS CH
1.0000 | ORAL_TABLET | Freq: Every day | ORAL | Status: DC
  Administered 2017-08-22 – 2017-08-28 (×7): 1 via ORAL
  Filled 2017-08-22 (×7): qty 1

## 2017-08-22 MED ORDER — LORAZEPAM 1 MG PO TABS: 1.0000 mg | ORAL_TABLET | Freq: Four times a day (QID) | ORAL | Status: AC | PRN

## 2017-08-22 MED ORDER — PANTOPRAZOLE SODIUM 40 MG PO TBEC
40.0000 mg | DELAYED_RELEASE_TABLET | Freq: Two times a day (BID) | ORAL | Status: DC
  Administered 2017-08-22 – 2017-08-28 (×12): 40 mg via ORAL
  Filled 2017-08-22 (×12): qty 1

## 2017-08-22 MED ORDER — THIAMINE HCL 100 MG/ML IJ SOLN
100.0000 mg | Freq: Every day | INTRAMUSCULAR | Status: DC
  Administered 2017-08-23: 100 mg via INTRAVENOUS
  Filled 2017-08-22: qty 2

## 2017-08-22 MED ORDER — SODIUM CHLORIDE 0.9 % IV SOLN
INTRAVENOUS | Status: DC
  Administered 2017-08-22: 12:00:00 via INTRAVENOUS

## 2017-08-22 MED ORDER — LACTULOSE 10 GM/15ML PO SOLN
20.0000 g | Freq: Three times a day (TID) | ORAL | Status: DC
  Administered 2017-08-22 – 2017-08-24 (×5): 20 g via ORAL
  Filled 2017-08-22 (×5): qty 30

## 2017-08-22 MED ORDER — HYPROMELLOSE (GONIOSCOPIC) 2.5 % OP SOLN: 2.0000 [drp] | Freq: Three times a day (TID) | OPHTHALMIC | Status: DC | PRN

## 2017-08-22 MED ORDER — ACETAMINOPHEN 325 MG PO TABS
650.0000 mg | ORAL_TABLET | Freq: Four times a day (QID) | ORAL | Status: DC | PRN
  Administered 2017-08-22 – 2017-08-23 (×2): 650 mg via ORAL
  Filled 2017-08-22 (×2): qty 2

## 2017-08-22 NOTE — H&P (Signed)
History and Physical    Jose [name]:[id number] DOB: July 18, 1955 DOA: 08/22/2017  PCP: [name], MD   Patient coming from: Home  Chief Complaint: Swelling  HPI: Jose [name] is a 62 y.o. male with medical history significant of history of liver cirrhosis with ascites, history of hypertension, history of hepatitis C, history of depression, alcohol dependence, chronic gastritis, thrombocytopenia, and other comorbidities who presents to [facility] with a chief complaint of lower externally swelling.  Patient states that he has been compliant with his medications however when asked about his diet he states he is not adhered to a low-salt diet and is evidenced by him drinking [employer] soup in the ED.  Patient states he was here 2 weeks ago had a ultrasound paracentesis in which removed 6.3 L.  Since then patient has noticed fluid accumulating in his abdomen and his legs as well as his testicles.  He states that his scrotum was severely fluid-filled and which brought him to the ED for evaluation.  Patient states that as worse in the last few days.  Scrotum is very painful because of the fluid.  TRH was called to evaluate the patient patient was found to have acute kidney injury and he will be admitted.  Given his history of liver cirrhosis there is questionable hepatorenal syndrome.  ED Course: Had basic blood work done as well as a chest x-ray and urinalysis and urine drug screen.  Hospitalist was called to admit  Review of Systems: As per HPI otherwise 10 point review of systems negative.   Past Medical History:  Diagnosis Date  . Alcohol dependence ([place])   . Ascites   . Cirrhosis ([place])   . Depression   . 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 Hepatitis C    Hepatitis C  . HOH (hard of hearing)   . Hypertension   . Inguinal hernia    right  . QT prolongation   . Shortness of breath    with exertion  . Thrombocytopenia  ([place])    Past Surgical History:  Procedure Laterality Date  . CIRCUMCISION    . COLONOSCOPY  march 2015   Dr. Renee Harder: nodular mucosa at appendiceal orifice, tubular adenoma, extremely poor prep  . COLONOSCOPY    . ESOPHAGOGASTRODUODENOSCOPY N/A 08/05/2017   Procedure: ESOPHAGOGASTRODUODENOSCOPY (EGD);  Surgeon: Jackquline Denmark, MD;  Location: [facility] ENDOSCOPY;  Service: Endoscopy;  Laterality: N/A;  . ESOPHAGOGASTRODUODENOSCOPY (EGD) WITH PROPOFOL N/A 07/13/2017   Procedure: ESOPHAGOGASTRODUODENOSCOPY (EGD) WITH PROPOFOL;  Surgeon: Doran Stabler, MD;  Location: [facility] ENDOSCOPY;  Service: Gastroenterology;  Laterality: N/A;  . IR PARACENTESIS  08/04/2017  . IR PARACENTESIS  08/07/2017  . TOOTH EXTRACTION N/A 05/15/2017   Procedure: DENTAL RESTORATION and multiple teeth EXTRACTIONS;  Surgeon: Diona Browner, DDS;  Location: [place];  Service: Oral Surgery;  Laterality: N/A;   SOCIAL HISTORY  reports that he has been smoking cigarettes.  He has a 5.64 pack-year smoking history. He has never used smokeless tobacco. He reports that he drinks alcohol. He reports that he does not use drugs.  ALLERGIES No Known Allergies  Family History  Problem Relation Age of Onset  . Breast cancer Other   . Diabetes Other   . Hypertension Other   . Breast cancer Mother   . Hypertension Mother   . Diabetes Father   . Hypertension Sister   . Heart disease Sister   .  Hypertension Paternal Grandmother   . Colon cancer Neg Hx    Prior to Admission medications   Medication Sig Start Date End Date Taking? Authorizing Provider  Cholecalciferol (VITAMIN D-3) 1000 units CAPS Take 1,000 Units by mouth daily.   Yes [provider]  fluticasone (FLONASE) 50 MCG/ACT nasal spray Place 1 spray into both nostrils daily as needed for allergies.    Yes [provider]  furosemide (LASIX) 40 MG tablet Take 1 tablet (40 mg total) by mouth daily. 08/09/17  Yes Georgette Shell, MD  hydroxypropyl  methylcellulose / hypromellose (ISOPTO TEARS / GONIOVISC) 2.5 % ophthalmic solution Place 2 drops into both eyes 3 (three) times daily as needed for dry eyes.   Yes [provider]  lactulose (CHRONULAC) 10 GM/15ML solution Take 45 mLs (30 g total) by mouth 4 (four) times daily. Patient taking differently: Take 20 g by mouth 3 (three) times daily.  08/06/16  Yes Reyne Dumas, MD  Multiple Vitamin (TAB-A-VITE) TABS Take 1 tablet by mouth daily.   Yes [provider]  ondansetron (ZOFRAN) 4 MG tablet Take 1 tablet (4 mg total) by mouth every 6 (six) hours as needed for nausea. 08/08/17  Yes Georgette Shell, MD  oxyCODONE (OXY IR/ROXICODONE) 5 MG immediate release tablet Take 1 tablet (5 mg total) by mouth 2 (two) times daily. 08/08/17  Yes Georgette Shell, MD  pantoprazole (PROTONIX) 40 MG tablet Take 1 tablet (40 mg total) by mouth 2 (two) times daily. 08/08/17  Yes Georgette Shell, MD  PROAIR HFA 108 [phone] Base) MCG/ACT inhaler Inhale 2 puffs into the lungs 4 (four) times daily as needed for shortness of breath or wheezing. 04/02/17  Yes [provider]  rifaximin (XIFAXAN) 550 MG TABS tablet Take 1 tablet (550 mg total) by mouth 2 (two) times daily. 11/14/16  Yes Danis, Kirke Corin, MD  spironolactone (ALDACTONE) 50 MG tablet Take 1 tablet (50 mg total) by mouth daily. 08/09/17  Yes Georgette Shell, MD    Physical Exam: Vitals:   08/22/17 0940 08/22/17 0944 08/22/17 1406 08/22/17 1508  BP:   126/78 114/80  Pulse:   (!) 52 62  Resp:   20   Temp:      TempSrc:      SpO2: 97%  100% 100%  Weight:  84.8 kg (187 lb)    Height:  5\' 6"  (1.676 m)     Constitutional: WN/WD AAM in NAD and appears calm  Eyes: Lids and conjunctivae normal, sclerae slightly icteric ENMT: External Ears, Nose appear normal. Slightly hard of hearing. Mucous membranes are moist. Neck: Appears normal, supple, no cervical masses, normal ROM, no appreciable thyromegaly; no  JVD Respiratory: Diminished to auscultation bilaterally, no wheezing, rales, rhonchi or crackles. Normal respiratory effort and patient is not tachypenic. No accessory muscle use.  Cardiovascular: RRR, no murmurs / rubs / gallops. S1 and S2 auscultated. 2-3+ LE Edema Bilaterally  Abdomen: Soft, NT, Distended with Ascites.  Bowel sounds positive diminished. As abdominal swelling.  GU: Massive Scrotal edema; Musculoskeletal: No clubbing / cyanosis of digits/nails. No joint deformity upper and lower extremities.  Skin: No rashes, lesions, ulcers on a limited skin eval. No induration; Warm and dry.  Neurologic: CN 2-12 grossly intact with no focal deficits.  Romberg sign and cerebellar reflexes not assessed.  Psychiatric: Normal judgment and insight. Alert and oriented x 3. Normal mood and appropriate affect.   Labs on Admission: I have personally reviewed following labs  and imaging studies  CBC: Recent Labs  Lab 08/22/17 1211  WBC 4.1  NEUTROABS 2.7  HGB 8.4*  HCT 26.1*  MCV 85.6  PLT 72*   Basic Metabolic Panel: Recent Labs  Lab 08/22/17 1211  NA 132*  K 3.7  CL 102  CO2 17*  GLUCOSE 121*  BUN 32*  CREATININE 3.80*  CALCIUM 8.1*   GFR: Estimated Creatinine Clearance: 20.8 mL/min (A) (by C-G formula based on SCr of 3.8 mg/dL (H)). Liver Function Tests: Recent Labs  Lab 08/22/17 1211  AST 91*  ALT 54  ALKPHOS 133*  BILITOT 3.4*  PROT 5.6*  ALBUMIN 2.5*   No results for input(s): LIPASE, AMYLASE in the last 168 hours. No results for input(s): AMMONIA in the last 168 hours. Coagulation Profile: Recent Labs  Lab 08/22/17 1211  INR 1.57   Cardiac Enzymes: No results for input(s): CKTOTAL, CKMB, CKMBINDEX, TROPONINI in the last 168 hours. BNP (last 3 results) No results for input(s): PROBNP in the last 8760 hours. HbA1C: No results for input(s): HGBA1C in the last 72 hours. CBG: No results for input(s): GLUCAP in the last 168 hours. Lipid Profile: No results  for input(s): CHOL, HDL, LDLCALC, TRIG, CHOLHDL, LDLDIRECT in the last 72 hours. Thyroid Function Tests: No results for input(s): TSH, T4TOTAL, FREET4, T3FREE, THYROIDAB in the last 72 hours. Anemia Panel: No results for input(s): VITAMINB12, FOLATE, FERRITIN, TIBC, IRON, RETICCTPCT in the last 72 hours. Urine analysis:    Component Value Date/Time   COLORURINE YELLOW 08/22/2017 1401   APPEARANCEUR HAZY (A) 08/22/2017 1401   LABSPEC 1.008 08/22/2017 1401   PHURINE 5.0 08/22/2017 1401   GLUCOSEU NEGATIVE 08/22/2017 1401   HGBUR NEGATIVE 08/22/2017 1401   BILIRUBINUR NEGATIVE 08/22/2017 1401   KETONESUR NEGATIVE 08/22/2017 1401   PROTEINUR NEGATIVE 08/22/2017 1401   UROBILINOGEN 4.0 (H) 04/26/2013 0500   NITRITE NEGATIVE 08/22/2017 1401   LEUKOCYTESUR NEGATIVE 08/22/2017 1401   Sepsis Labs: !!!!!!!!!!!!!!!!!!!!!!!!!!!!!!!!!!!!!!!!!!!! @LABRCNTIP (procalcitonin:4,lacticidven:4) )No results found for this or any previous visit (from the past 240 hour(s)).   Radiological Exams on Admission: Dg Chest 2 View  Result Date: 08/22/2017 CLINICAL DATA:  Shortness of breath EXAM: CHEST - 2 VIEW COMPARISON:  Chest radiograph 07/15/2016 FINDINGS: Low lung volumes. Stable prominent cardiac and mediastinal contours. No consolidative pulmonary opacities. Bibasilar atelectasis. No pleural effusion or pneumothorax. Osseous structures unremarkable. IMPRESSION: No acute cardiopulmonary process. Electronically Signed   By: Lovey Newcomer M.D.   On: 08/22/2017 14:36   US Scrotum W/doppler  Result Date: 08/22/2017 CLINICAL DATA:  Chronic testicular swelling and pain. Cirrhosis and ascites. EXAM: SCROTAL ULTRASOUND DOPPLER ULTRASOUND OF THE TESTICLES TECHNIQUE: Complete ultrasound examination of the testicles, epididymis, and other scrotal structures was performed. Color and spectral Doppler ultrasound were also utilized to evaluate blood flow to the testicles. COMPARISON:  08/05/2017 FINDINGS: Right testicle  Measurements: 4.8 x 2.7 x 3.8 cm. No mass or microlithiasis visualized. Left testicle Measurements: 3.7 x 2.2 x 2.6 cm. No mass or microlithiasis visualized. Right epididymis:  Normal in size and appearance. Left epididymis:  Normal in size and appearance. Hydrocele: Large bilateral supra testicular fluid collections are seen which contain low-level internal echoes and several internal septations, and displace the testicles inferiorly. Varicocele:  None visualized. Pulsed Doppler interrogation of both testes demonstrates normal low resistance arterial and venous waveforms bilaterally. IMPRESSION: No evidence of testicular mass or torsion. Large bilateral supra-testicular fluid collections, most likely due to fluid-filled inguinal hernias although loculated hydroceles cannot be excluded. Electronically Signed  By: Earle Gell M.D.   On: 08/22/2017 14:13   EKG: Independently reviewed.  Shows sinus bradycardia at a rate of 48.  There are no evidence of ST elevation or ST depression on my interpretation  Assessment/Plan Active Problems:   AKI (acute kidney injury) ([place])  Acute Kidney Injury likely from Volume Overload with Anasarca and Decompensation of Liver Cirrhosis; Concern for Hepatorenal Syndrome -Admit to telemetry -Concern for hepatorenal syndrome; baseline creatinine is around 1.1 -[facility] with massive anasarca and swelling as well as lower extremity edema and a BUN/creatinine of 32/3.80 -Discussed with Dr. Carolin Sicks of Nephrology and currently recommends IV diuresis, strict I's and O's, will check a renal ultrasound as well as urine electrolytes -If patient is not improving Dr. Carolin Sicks will formally consult in the a.m. -Patient was given IV Lasix 40 mg once and will start on IV Lasix twice daily starting tomorrow  -We will have Foley catheter placement for aggressive IV diuresis -Strict I's and O's, daily weights, saline lock IV -We will obtain a renal ultrasound and  check urine creatinine, urine sodium, urine osmolality. -Patient is noncompliant with a low-salt diet and states that he eats whatever he wants this is evidenced by him drinking Campbell soup in the ED -Check Echocardiogram to rule out any evidence of any heart -Continue to monitor volume status very carefully and repeat CMP in a.m.  Massive scrotal swelling -Secondary anasarca and decompensated liver cirrhosis -U/S scrotum was done and showed No evidence of testicular mass or torsion. Large bilateral supra-testicular fluid collections, most likely due to fluid-filled inguinal hernias although loculated hydroceles cannot be excluded. -If not improving with diuresis will discuss with Urology  Anasarca and ascites likely from liver cirrhosis -We will obtain a IR paracentesis -Patient was here 2 weeks ago and drained 6.3 L -Continue with IV furosemide 40 mg twice daily -Continue with 2 g sodium diet -Continue with spironolactone 50 mg p.o. daily -Strict I's and O's and daily weights   Abdominal distention with ascites related to liver cirrhosis -IR paracentesis been ordered -As above continue with IV diuretics with IV Lasix and continue Spironolactone 50 mg p.o. daily  Thrombocytopenia -Chronic -Patient's platelet count was 72 on admission -Continue to monitor and repeat CBC in the a.m.  Cirrhosis likely secondary to alcohol usage as well as Hepatitis C -AST was 91 and ALT was 54 and admission, T bili was 3.4 -Continue with lactulose 20 g 3 times daily to prevent hepatic encephalopathy -Continue with rifaximin 550 mg p.o. twice daily we will add spironolactone 50 mg p.o. Daily -If not improving will discuss with gastroenterology  Hx of Alcohol Dependence -Patient states that he drank last 4 weeks ago however is not reliable source -Alcohol level was less than 10 -Placed on CIWA protocol with IV lorazepam -Continue folic acid, multivitamin, as well as thiamine  Tobacco  Abuse -Smoking cessation counseling given -The patient with a nicotine patch 14 mg TD  Polysubstance Abuse  -Tested positive for cocaine on UDS -Counseling given  Hyponatremia -Likely hypervolemic hypo-natremia -Continue with diuresis as above -Continue to monitor repeat CMP in a.m.  History of hypertension -Avoid beta-blockers given recent cocaine use -If necessary we will add IV hydralazine  History of gastric varices as well as chronic gastritis -Recently had EGD -Continue with Protonix PPI twice daily  DVT prophylaxis: SCDs Code Status: Full code Family Communication: No family present at bedside Disposition Plan: Clinical course we will have PT OT evaluate Consults called:  Discussed with nephrology Dr. Carolin Sicks Admission status: Inpatient telemetry  Severity of Illness: The appropriate patient status for this patient is INPATIENT. Inpatient status is judged to be reasonable and necessary in order to provide the required intensity of service to ensure the patient's safety. The patient's presenting symptoms, physical exam findings, and initial radiographic and laboratory data in the context of their chronic comorbidities is felt to place them at high risk for further clinical deterioration. Furthermore, it is not anticipated that the patient will be medically stable for discharge from the hospital within 2 midnights of admission. The following factors support the patient status of inpatient.   " The patient's presenting symptoms include scrotal swelling and leg swelling. " The worrisome physical exam findings include anasarca and distended abdomen with ascites. " The initial radiographic and laboratory data are worrisome because of acute renal failure. " The chronic co-morbidities include liver cirrhosis, hepatitis C, thrombocytopenia, gastritis, history of varices.   * I certify that at the point of admission it is my clinical judgment that the patient will require inpatient  hospital care spanning beyond 2 midnights from the point of admission due to high intensity of service, high risk for further deterioration and high frequency of surveillance required.Kerney Elbe, D.O. [facility] Pager [phone]  If 7PM-7AM, please contact night-coverage [email or web address] Password [facility]  08/22/2017, 3:35 PM

## 2017-08-22 NOTE — Plan of Care (Signed)

## 2017-08-22 NOTE — ED Notes (Signed)
Pt states last drink was 4 weeks ago

## 2017-08-22 NOTE — ED Triage Notes (Signed)
PT BIB EMS C/O TESTICULAR PAIN AND SWELLING. 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 DENIES INJURY.

## 2017-08-22 NOTE — ED Notes (Signed)
[place] here to scan real quick then pt will be taken to room

## 2017-08-22 NOTE — ED Notes (Signed)
ED TO INPATIENT HANDOFF REPORT  Name/Age/Gender Jose [name] Hon 61 y.o. male  Code Status Code Status History    Date Active Date Inactive Code Status Order ID Comments User Context   08/04/2017 1600 08/08/2017 1651 Full Code [id number]  [name], MD ED   07/12/2017 1234 07/15/2017 1615 Full Code [id number]  Hosie Poisson, MD Inpatient   08/14/2016 1547 08/18/2016 1903 Full Code [id number]  Samella Parr, NP Inpatient   07/30/2016 1918 08/06/2016 2021 Full Code [id number]  Domenic Polite, MD Inpatient   07/15/2016 2009 07/18/2016 1740 Full Code [id number]  Etta Quill, DO ED   06/06/2016 1549 06/10/2016 2056 Full Code [id number]  Edwin Dada, MD Inpatient   05/10/2016 2308 05/11/2016 2136 Full Code [id number]  Ivor Costa, MD ED   04/26/2016 1228 04/28/2016 1638 Full Code [id number]  Cristal Ford, DO ED   09/04/2015 0111 09/07/2015 1645 Full Code [id number]  Rise Patience, MD Inpatient   07/23/2015 2253 07/29/2015 1607 Full Code [id number]  Vianne Bulls, MD ED   06/26/2015 2349 06/29/2015 1815 Full Code [id number]  Theressa Millard, MD Inpatient   04/26/2013 0628 04/29/2013 1847 Full Code [id number]  Bynum Bellows, MD ED      Home/SNF/Other Home  Chief Complaint testicle swollen   Level of Care/Admitting Diagnosis ED Disposition    ED Disposition Condition [facility] Area: [facility] [[id number]]  Level of Care: Telemetry [5]  Admit to tele based on following criteria: Acute CHF  Diagnosis: AKI (acute kidney injury) [facility]) [[id number]]  Admitting Physician: [place], [place] [[id number]]  Attending Physician: Raiford Noble LATIF [[id number]]  Estimated length of stay: past midnight tomorrow  Certification:: I certify this patient will need inpatient services for at least 2 midnights  PT Class (Do Not Modify): Inpatient [101]  PT Acc Code (Do Not Modify): Private [1]       Medical History Past Medical History:  Diagnosis Date  . Alcohol  dependence ([place])   . Ascites   . Cirrhosis ([place])   . Depression   . 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 Hepatitis C    Hepatitis C  . HOH (hard of hearing)   . Hypertension   . Inguinal hernia    right  . QT prolongation   . Shortness of breath    with exertion  . Thrombocytopenia ([place])     Allergies No Known Allergies  IV Location/Drains/Wounds Patient Lines/Drains/Airways Status   Active Line/Drains/Airways    Name:   Placement date:   Placement time:   Site:   Days:   Peripheral IV 08/22/17 Left Arm   08/22/17    1228    Arm   less than 1   Incision (Closed) 05/15/17 N/A Other (Comment)   05/15/17    0904     99          Labs/Imaging Results for orders placed or performed during the hospital encounter of 08/22/17 (from the past 48 hour(s))  CBC with Differential/Platelet     Status: Abnormal   Collection Time: 08/22/17 12:11 PM  Result Value Ref Range   WBC 4.1 4.0 - 10.5 K/uL   RBC 3.05 (L) 4.22 - 5.81 MIL/uL   Hemoglobin 8.4 (L) 13.0 - 17.0 g/dL   HCT 26.1 (L) 39.0 - 52.0 %   MCV 85.6 78.0 - 100.0 fL   MCH 27.5  26.0 - 34.0 pg   MCHC 32.2 30.0 - 36.0 g/dL   RDW 18.6 (H) 11.5 - 15.5 %   Platelets 72 (L) 150 - 400 K/uL    Comment: RESULT REPEATED AND VERIFIED SPECIMEN CHECKED FOR CLOTS CONSISTENT WITH PREVIOUS RESULT    Neutrophils Relative % 65 %   Neutro Abs 2.7 1.7 - 7.7 K/uL   Lymphocytes Relative 20 %   Lymphs Abs 0.8 0.7 - 4.0 K/uL   Monocytes Relative 10 %   Monocytes Absolute 0.4 0.1 - 1.0 K/uL   Eosinophils Relative 4 %   Eosinophils Absolute 0.2 0.0 - 0.7 K/uL   Basophils Relative 1 %   Basophils Absolute 0.0 0.0 - 0.1 K/uL    Comment: Performed at [facility], [place] [address] [place], [place] [address]  Comprehensive metabolic panel     Status: Abnormal   Collection Time: 08/22/17 12:11 PM  Result Value Ref Range   Sodium 132 (L) 135 - 145 mmol/L   Potassium 3.7 3.5 - 5.1  mmol/L   Chloride 102 101 - 111 mmol/L   CO2 17 (L) 22 - 32 mmol/L   Glucose, Bld 121 (H) 65 - 99 mg/dL   BUN 32 (H) 6 - 20 mg/dL   Creatinine, Ser 3.80 (H) 0.61 - 1.24 mg/dL   Calcium 8.1 (L) 8.9 - 10.3 mg/dL   Total Protein 5.6 (L) 6.5 - 8.1 g/dL   Albumin 2.5 (L) 3.5 - 5.0 g/dL   AST 91 (H) 15 - 41 U/L   ALT 54 17 - 63 U/L   Alkaline Phosphatase 133 (H) 38 - 126 U/L   Total Bilirubin 3.4 (H) 0.3 - 1.2 mg/dL   GFR calc non Af Amer 16 (L) >60 mL/min   GFR calc Af Amer 18 (L) >60 mL/min    Comment: (NOTE) The eGFR has been calculated using the CKD EPI equation. This calculation has not been validated in all clinical situations. eGFR's persistently <60 mL/min signify possible Chronic Kidney Disease.    Anion gap 13 5 - 15    Comment: Performed at [facility], [place] [address] [place], [place] [address]  Protime-INR     Status: Abnormal   Collection Time: 08/22/17 12:11 PM  Result Value Ref Range   Prothrombin Time 18.6 (H) 11.4 - 15.2 seconds   INR 1.57     Comment: Performed at [facility], [place] [address], [place], [place] [address]  APTT     Status: Abnormal   Collection Time: 08/22/17 12:11 PM  Result Value Ref Range   aPTT 39 (H) 24 - 36 seconds    Comment:        IF BASELINE aPTT IS ELEVATED, SUGGEST PATIENT RISK ASSESSMENT BE USED TO DETERMINE APPROPRIATE ANTICOAGULANT THERAPY. Performed at [facility], [place] [address], [place], [place] [address]   Ethanol     Status: None   Collection Time: 08/22/17 12:11 PM  Result Value Ref Range   Alcohol, Ethyl (B) <10 <10 mg/dL    Comment:        LOWEST DETECTABLE LIMIT FOR SERUM ALCOHOL IS 10 mg/dL FOR MEDICAL PURPOSES ONLY Performed at [place] [address], [place], [place] [address]   Urinalysis, Routine w reflex microscopic     Status: Abnormal   Collection Time: 08/22/17  2:01 PM  Result Value Ref Range   Color, Urine YELLOW YELLOW    APPearance HAZY (A) CLEAR  Specific Gravity, Urine 1.008 1.005 - 1.030   pH 5.0 5.0 - 8.0   Glucose, UA NEGATIVE NEGATIVE mg/dL   Hgb urine dipstick NEGATIVE NEGATIVE   Bilirubin Urine NEGATIVE NEGATIVE   Ketones, ur NEGATIVE NEGATIVE mg/dL   Protein, ur NEGATIVE NEGATIVE mg/dL   Nitrite NEGATIVE NEGATIVE   Leukocytes, UA NEGATIVE NEGATIVE    Comment: Performed at [facility] [address], [place], [place] [address]  Rapid urine drug screen (hospital performed)     Status: Abnormal   Collection Time: 08/22/17  2:01 PM  Result Value Ref Range   Opiates NONE DETECTED NONE DETECTED   Cocaine POSITIVE (A) NONE DETECTED   Benzodiazepines NONE DETECTED NONE DETECTED   Amphetamines NONE DETECTED NONE DETECTED   Tetrahydrocannabinol NONE DETECTED NONE DETECTED   Barbiturates NONE DETECTED NONE DETECTED    Comment: (NOTE) DRUG SCREEN FOR MEDICAL PURPOSES ONLY.  IF CONFIRMATION IS NEEDED FOR ANY PURPOSE, NOTIFY LAB WITHIN 5 DAYS. LOWEST DETECTABLE LIMITS FOR URINE DRUG SCREEN Drug Class                     Cutoff (ng/mL) Amphetamine and metabolites    1000 Barbiturate and metabolites    200 Benzodiazepine                 257 Tricyclics and metabolites     300 Opiates and metabolites        300 Cocaine and metabolites        300 THC                            50 Performed at [facility], [place] [address] [place], [place] [address]    Dg Chest 2 View  Result Date: 08/22/2017 CLINICAL DATA:  Shortness of breath EXAM: CHEST - 2 VIEW COMPARISON:  Chest radiograph 07/15/2016 FINDINGS: Low lung volumes. Stable prominent cardiac and mediastinal contours. No consolidative pulmonary opacities. Bibasilar atelectasis. No pleural effusion or pneumothorax. Osseous structures unremarkable. IMPRESSION: No acute cardiopulmonary process. Electronically Signed   By: Lovey Newcomer M.D.   On: 08/22/2017 14:36   US Scrotum W/doppler  Result Date:  08/22/2017 CLINICAL DATA:  Chronic testicular swelling and pain. Cirrhosis and ascites. EXAM: SCROTAL ULTRASOUND DOPPLER ULTRASOUND OF THE TESTICLES TECHNIQUE: Complete ultrasound examination of the testicles, epididymis, and other scrotal structures was performed. Color and spectral Doppler ultrasound were also utilized to evaluate blood flow to the testicles. COMPARISON:  08/05/2017 FINDINGS: Right testicle Measurements: 4.8 x 2.7 x 3.8 cm. No mass or microlithiasis visualized. Left testicle Measurements: 3.7 x 2.2 x 2.6 cm. No mass or microlithiasis visualized. Right epididymis:  Normal in size and appearance. Left epididymis:  Normal in size and appearance. Hydrocele: Large bilateral supra testicular fluid collections are seen which contain low-level internal echoes and several internal septations, and displace the testicles inferiorly. Varicocele:  None visualized. Pulsed Doppler interrogation of both testes demonstrates normal low resistance arterial and venous waveforms bilaterally. IMPRESSION: No evidence of testicular mass or torsion. Large bilateral supra-testicular fluid collections, most likely due to fluid-filled inguinal hernias although loculated hydroceles cannot be excluded. Electronically Signed   By: Earle Gell M.D.   On: 08/22/2017 14:13    Pending Labs Unresulted Labs (From admission, onward)   Start     Ordered   08/22/17 1511  Culture, Urine  Once,   R     08/22/17 1510   08/22/17  1505  Creatinine, urine, random  Once,   R     08/22/17 1504   08/22/17 1505  Osmolality, urine  Once,   R     08/22/17 1504   08/22/17 1504  Sodium, urine, random  Once,   R     08/22/17 1504   Signed and Held  TSH  Once,   R     Signed and Held   Signed and Held  CBC WITH DIFFERENTIAL  Tomorrow morning,   R     Signed and Held   Signed and Held  Magnesium  Add-on,   R     Signed and Held   Signed and Held  Phosphorus  Add-on,   R     Signed and Held   Signed and Held  Comprehensive metabolic  panel  Tomorrow morning,   R     Signed and Held   Signed and Held  CBC  Tomorrow morning,   R     Signed and Held      Vitals/Pain Today's Vitals   08/22/17 1406 08/22/17 1508 08/22/17 1535 08/22/17 1542  BP: 126/78 114/80    Pulse: (!) 52 62    Resp: 20     Temp:      TempSrc:      SpO2: 100% 100%    Weight:      Height:      PainSc:   7  7     Isolation Precautions No active isolations  Medications Medications  oxyCODONE-acetaminophen (PERCOCET/ROXICET) 5-325 MG per tablet 1 tablet (1 tablet Oral Given 08/22/17 0949)  0.9 %  sodium chloride infusion ( Intravenous New Bag/Given 08/22/17 1215)  furosemide (LASIX) injection 40 mg (has no administration in time range)  LORazepam (ATIVAN) tablet 1 mg (has no administration in time range)    Or  LORazepam (ATIVAN) injection 1 mg (has no administration in time range)  thiamine (VITAMIN B-1) tablet 100 mg (has no administration in time range)    Or  thiamine (B-1) injection 100 mg (has no administration in time range)  folic acid (FOLVITE) tablet 1 mg (has no administration in time range)  multivitamin with minerals tablet 1 tablet (has no administration in time range)  LORazepam (ATIVAN) injection 0-4 mg (has no administration in time range)    Followed by  LORazepam (ATIVAN) injection 0-4 mg (has no administration in time range)  nicotine (NICODERM CQ - dosed in mg/24 hours) patch 14 mg (has no administration in time range)  furosemide (LASIX) injection 40 mg (40 mg Intravenous Given 08/22/17 1537)    Mobility walks

## 2017-08-22 NOTE — ED Provider Notes (Signed)
[facility] DEPT Provider Note   CSN: [id number] Arrival date & time: 08/22/17  0930     History   Chief Complaint Chief Complaint  Patient presents with  . Testicle Pain    AND SWELLING    HPI Jose [name] is a 62 y.o. male.  62 year old male with history of chronic scrotal edema presents with worsening edema in his scrotum x2 days.  Denies any dysuria.  No penile drainage or discharge.  No fever or chills.  He also has a history of ascites and states his abdominal girth has been essentially unchanged.  No dyspnea on exertion.  Patient notes fullness in his scrotum that is worse when he stands up.  Symptoms persistent nothing makes it better no treatment is prior to arrival     Past Medical History:  Diagnosis Date  . Alcohol dependence ([place])   . Ascites   . Cirrhosis ([place])   . Depression   . 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 Hepatitis C    Hepatitis C  . HOH (hard of hearing)   . Hypertension   . Inguinal hernia    right  . QT prolongation   . Shortness of breath    with exertion  . Thrombocytopenia [facility])     Patient Active Problem List   Diagnosis Date Noted  . Palliative care by specialist   . Decompensation of cirrhosis of liver ([place])   . Anemia 08/05/2017  . Polysubstance abuse ([place]) 08/04/2017  . Upper GI bleed 08/04/2017  . Acute anemia 07/13/2017  . Symptomatic anemia 07/12/2017  . Hyponatremia 07/12/2017  . Smoker 01/08/2017  . Pancytopenia, acquired ([place]) 01/08/2017  . Pain in right leg 10/06/2016  . End stage liver disease ([place]) 09/10/2016  . Palliative care encounter   . Goals of care, counseling/discussion   . Liver mass, right lobe   . 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 Hepatitis C, chronic ([place]) 01/04/2016  . Right inguinal hernia 09/12/2015  . Essential hypertension 09/12/2015  . Gastroesophageal reflux disease without esophagitis 09/12/2015  . Protein-calorie  malnutrition, severe ([place]) 09/12/2015  . Anemia of chronic disease 09/12/2015  . Arthritis of left knee 09/12/2015  . Elevated LFTs   . Alcoholic cirrhosis of liver with ascites ([place]) 07/25/2015  . Macrocytic anemia 07/23/2015  . Alcoholism ([place]) 07/12/2015  . Hepatic encephalopathy ([place]) 06/26/2015  . Adenomatous polyps 10/18/2013  . Unspecified vitamin D deficiency 07/13/2013  . Atopic dermatitis 04/28/2013  . Thrombocytopenia ([place]) 04/28/2013    Past Surgical History:  Procedure Laterality Date  . CIRCUMCISION    . COLONOSCOPY  march 2015   Dr. Renee Harder: nodular mucosa at appendiceal orifice, tubular adenoma, extremely poor prep  . COLONOSCOPY    . ESOPHAGOGASTRODUODENOSCOPY N/A 08/05/2017   Procedure: ESOPHAGOGASTRODUODENOSCOPY (EGD);  Surgeon: Jackquline Denmark, MD;  Location: [facility] ENDOSCOPY;  Service: Endoscopy;  Laterality: N/A;  . ESOPHAGOGASTRODUODENOSCOPY (EGD) WITH PROPOFOL N/A 07/13/2017   Procedure: ESOPHAGOGASTRODUODENOSCOPY (EGD) WITH PROPOFOL;  Surgeon: Doran Stabler, MD;  Location: [facility] ENDOSCOPY;  Service: Gastroenterology;  Laterality: N/A;  . IR PARACENTESIS  08/04/2017  . IR PARACENTESIS  08/07/2017  . TOOTH EXTRACTION N/A 05/15/2017   Procedure: DENTAL RESTORATION and multiple teeth EXTRACTIONS;  Surgeon: Diona Browner, DDS;  Location: [facility];  Service: Oral Surgery;  Laterality: N/A;        Home Medications    Prior to Admission medications   Medication  Sig Start Date End Date Taking? Authorizing Provider  Cholecalciferol (VITAMIN D-3) 1000 units CAPS Take 1,000 Units by mouth daily.    [provider]  fluticasone (FLONASE) 50 MCG/ACT nasal spray Place 1 spray into both nostrils daily as needed for allergies.     [provider]  furosemide (LASIX) 40 MG tablet Take 1 tablet (40 mg total) by mouth daily. 08/09/17   Georgette Shell, MD  hydroxypropyl methylcellulose / hypromellose (ISOPTO TEARS / GONIOVISC) 2.5 % ophthalmic solution Place 2  drops into both eyes 3 (three) times daily as needed for dry eyes.    [provider]  lactulose (CHRONULAC) 10 GM/15ML solution Take 45 mLs (30 g total) by mouth 4 (four) times daily. Patient taking differently: Take 20 g by mouth 3 (three) times daily.  08/06/16   Reyne Dumas, MD  Multiple Vitamin (TAB-A-VITE) TABS Take 1 tablet by mouth daily.    [provider]  ondansetron (ZOFRAN) 4 MG tablet Take 1 tablet (4 mg total) by mouth every 6 (six) hours as needed for nausea. 08/08/17   Georgette Shell, MD  oxyCODONE (OXY IR/ROXICODONE) 5 MG immediate release tablet Take 1 tablet (5 mg total) by mouth 2 (two) times daily. 08/08/17   Georgette Shell, MD  pantoprazole (PROTONIX) 40 MG tablet Take 1 tablet (40 mg total) by mouth 2 (two) times daily. 08/08/17   Georgette Shell, MD  PROAIR HFA 108 [id number] Base) MCG/ACT inhaler Inhale 2 puffs into the lungs 4 (four) times daily as needed for shortness of breath or wheezing. 04/02/17   [provider]  rifaximin (XIFAXAN) 550 MG TABS tablet Take 1 tablet (550 mg total) by mouth 2 (two) times daily. 11/14/16   Doran Stabler, MD  spironolactone (ALDACTONE) 50 MG tablet Take 1 tablet (50 mg total) by mouth daily. 08/09/17   Georgette Shell, MD    Family History Family History  Problem Relation Age of Onset  . Breast cancer Other   . Diabetes Other   . Hypertension Other   . Breast cancer Mother   . Hypertension Mother   . Diabetes Father   . Hypertension Sister   . Heart disease Sister   . Hypertension Paternal Grandmother   . Colon cancer Neg Hx     Social History Social History   Tobacco Use  . Smoking status: Current Some Day Smoker    Packs/day: 0.12    Years: 47.00    Pack years: 5.64    Types: Cigarettes  . Smokeless tobacco: Never Used  Substance Use Topics  . Alcohol use: Yes    Comment: denies any in the last few weeks  . Drug use: No    Types: Marijuana, Cocaine    Comment: Last time  for cocaine a few weeks ago ;Marijuana- intermittent     Allergies   Patient has no known allergies.   Review of Systems Review of Systems  All other systems reviewed and are negative.    Physical Exam Updated Vital Signs BP 119/82 (BP Location: Left Arm)   Pulse 97   Temp 97.7 F (36.5 C) (Oral)   Resp 18   Ht 1.676 m (5\' 6" )   Wt 84.8 kg (187 lb)   SpO2 97%   BMI 30.18 kg/m   Physical Exam  Constitutional: He is oriented to person, place, and time. He appears well-developed and well-nourished.  Non-toxic appearance. No distress.  HENT:  Head: Normocephalic and atraumatic.  Eyes: Pupils are equal, round, and reactive to light. Conjunctivae, EOM and lids are normal.  Neck: Normal range of motion. Neck supple. No tracheal deviation present. No thyroid mass present.  Cardiovascular: Normal rate, regular rhythm and normal heart sounds. Exam reveals no gallop.  No murmur heard. Pulmonary/Chest: Effort normal and breath sounds normal. No stridor. No respiratory distress. He has no decreased breath sounds. He has no wheezes. He has no rhonchi. He has no rales.  Abdominal: Soft. Normal appearance and bowel sounds are normal. He exhibits distension and ascites. There is no tenderness. There is no rebound and no CVA tenderness.    Genitourinary:     Musculoskeletal: Normal range of motion. He exhibits no edema or tenderness.  Neurological: He is alert and oriented to person, place, and time. He has normal strength. No cranial nerve deficit or sensory deficit. GCS eye subscore is 4. GCS verbal subscore is 5. GCS motor subscore is 6.  Skin: Skin is warm and dry. No abrasion and no rash noted.  Psychiatric: He has a normal mood and affect. His speech is normal and behavior is normal.  Nursing note and vitals reviewed.    ED Treatments / Results  Labs (all labs ordered are listed, but only abnormal results are displayed) Labs Reviewed - No data to  display  EKG None  Radiology No results found.  Procedures Procedures (including critical care time)  Medications Ordered in ED Medications  oxyCODONE-acetaminophen (PERCOCET/ROXICET) 5-325 MG per tablet 1 tablet (1 tablet Oral Given 08/22/17 0949)  0.9 %  sodium chloride infusion (has no administration in time range)     Initial Impression / Assessment and Plan / ED Course  I have reviewed the triage vital signs and the nursing notes.  Pertinent labs & imaging results that were available during my care of the patient were reviewed by me and considered in my medical decision making (see chart for details).     Testicular ultrasound negative for acute surgical process.  Patient's urinalysis without infection.  Does have labs consistent with acute kidney injury.  He also has likely worsening liver dysfunction.  His INR is is not severely elevated however.  Discussed with hospitalist on-call and patient will be admitted to their service.  Final Clinical Impressions(s) / ED Diagnoses   Final diagnoses:  None    ED Discharge Orders    None       Lacretia Leigh, MD 08/22/17 1514

## 2017-08-23 ENCOUNTER — Inpatient Hospital Stay (HOSPITAL_COMMUNITY): Payer: Medicaid Other

## 2017-08-23 DIAGNOSIS — R0600 Dyspnea, unspecified: Secondary | ICD-10-CM

## 2017-08-23 DIAGNOSIS — D638 Anemia in other chronic diseases classified elsewhere: Secondary | ICD-10-CM

## 2017-08-23 DIAGNOSIS — K219 Gastro-esophageal reflux disease without esophagitis: Secondary | ICD-10-CM

## 2017-08-23 DIAGNOSIS — F1910 Other psychoactive substance abuse, uncomplicated: Secondary | ICD-10-CM

## 2017-08-23 DIAGNOSIS — D61818 Other pancytopenia: Secondary | ICD-10-CM

## 2017-08-23 LAB — COMPREHENSIVE METABOLIC PANEL
ALBUMIN: 2.2 g/dL — AB (ref 3.5–5.0)
ALT: 46 U/L (ref 17–63)
AST: 73 U/L — AB (ref 15–41)
Alkaline Phosphatase: 152 U/L — ABNORMAL HIGH (ref 38–126)
Anion gap: 11 (ref 5–15)
BUN: 34 mg/dL — AB (ref 6–20)
CHLORIDE: 104 mmol/L (ref 101–111)
CO2: 19 mmol/L — AB (ref 22–32)
CREATININE: 3.68 mg/dL — AB (ref 0.61–1.24)
Calcium: 7.9 mg/dL — ABNORMAL LOW (ref 8.9–10.3)
GFR calc Af Amer: 19 mL/min — ABNORMAL LOW (ref >60)
GFR calc non Af Amer: 16 mL/min — ABNORMAL LOW (ref >60)
GLUCOSE: 148 mg/dL — AB (ref 65–99)
POTASSIUM: 3.4 mmol/L — AB (ref 3.5–5.1)
Sodium: 134 mmol/L — ABNORMAL LOW (ref 135–145)
Total Bilirubin: 2.7 mg/dL — ABNORMAL HIGH (ref 0.3–1.2)
Total Protein: 5 g/dL — ABNORMAL LOW (ref 6.5–8.1)

## 2017-08-23 LAB — CBC WITH DIFFERENTIAL/PLATELET
BASOS ABS: 0 10*3/uL (ref 0.0–0.1)
BASOS PCT: 0 %
EOS PCT: 3 %
Eosinophils Absolute: 0.1 10*3/uL (ref 0.0–0.7)
HCT: 23.7 % — ABNORMAL LOW (ref 39.0–52.0)
Hemoglobin: 7.7 g/dL — ABNORMAL LOW (ref 13.0–17.0)
LYMPHS PCT: 22 %
Lymphs Abs: 0.8 10*3/uL (ref 0.7–4.0)
MCH: 27.2 pg (ref 26.0–34.0)
MCHC: 32.5 g/dL (ref 30.0–36.0)
MCV: 83.7 fL (ref 78.0–100.0)
MONO ABS: 0.3 10*3/uL (ref 0.1–1.0)
Monocytes Relative: 7 %
NEUTROS ABS: 2.5 10*3/uL (ref 1.7–7.7)
Neutrophils Relative %: 68 %
PLATELETS: 65 10*3/uL — AB (ref 150–400)
RBC: 2.83 MIL/uL — ABNORMAL LOW (ref 4.22–5.81)
RDW: 19 % — AB (ref 11.5–15.5)
WBC: 3.6 10*3/uL — AB (ref 4.0–10.5)

## 2017-08-23 LAB — URINE CULTURE: Culture: NO GROWTH

## 2017-08-23 LAB — ECHOCARDIOGRAM COMPLETE
Height: 66 in
Weight: 3037.06 oz

## 2017-08-23 LAB — GLUCOSE, CAPILLARY: GLUCOSE-CAPILLARY: 130 mg/dL — AB (ref 65–99)

## 2017-08-23 MED ORDER — LIDOCAINE HCL 1 % IJ SOLN
INTRAMUSCULAR | Status: AC
  Administered 2017-08-23: 14:00:00
  Filled 2017-08-23: qty 10

## 2017-08-23 MED ORDER — DIPHENHYDRAMINE HCL 25 MG PO CAPS
25.0000 mg | ORAL_CAPSULE | Freq: Four times a day (QID) | ORAL | Status: DC | PRN
  Administered 2017-08-23: 25 mg via ORAL
  Filled 2017-08-23: qty 1

## 2017-08-23 MED ORDER — POTASSIUM CHLORIDE CRYS ER 20 MEQ PO TBCR
40.0000 meq | EXTENDED_RELEASE_TABLET | Freq: Once | ORAL | Status: AC
  Administered 2017-08-23: 40 meq via ORAL
  Filled 2017-08-23: qty 2

## 2017-08-23 NOTE — Progress Notes (Signed)
Paged doctor as patient is requesting something to help with his itching.

## 2017-08-23 NOTE — Progress Notes (Signed)
  Echocardiogram 2D Echocardiogram has been performed.  Merrie Roof F 08/23/2017, 10:27 AM

## 2017-08-23 NOTE — Plan of Care (Signed)

## 2017-08-23 NOTE — Evaluation (Signed)
Physical Therapy Evaluation Patient Details Name: LANNY LIPKIN MRN: [id number] DOB: March 18, 1956 Today's Date: 08/23/2017   History of Present Illness  XAVIER MUNGER is a 62 y.o. male with medical history significant of history of liver cirrhosis with ascites, history of hypertension, history of hepatitis C, history of depression, alcohol dependence, chronic gastritis, thrombocytopenia, and other comorbidities who presents to [facility] hospital with a chief complaint of lower extremity & scrotal swelling  Clinical Impression  Patient presents with decreased mobility due to LE weakness, swelling and deconditioning.  He will benefit from skilled PT in the acute setting to allow return home with intermittent family support.      Follow Up Recommendations No PT follow up    Equipment Recommendations  None recommended by PT    Recommendations for Other Services       Precautions / Restrictions Precautions Precautions: Fall      Mobility  Bed Mobility Overal bed mobility: Needs Assistance Bed Mobility: Supine to Sit     Supine to sit: Supervision;HOB elevated     General bed mobility comments: assist for safety (due to scrotal swelling)  Transfers Overall transfer level: Needs assistance Equipment used: Rolling walker (2 wheeled) Transfers: Sit to/from Stand Sit to Stand: Supervision            Ambulation/Gait Ambulation/Gait assistance: Supervision Ambulation Distance (Feet): 150 Feet Assistive device: Rolling walker (2 wheeled) Gait Pattern/deviations: Step-through pattern;Wide base of support;Decreased stride length     General Gait Details: slight changes due to edema with wider BOS  Stairs            Wheelchair Mobility    Modified Rankin (Stroke Patients Only)       Balance Overall balance assessment: Needs assistance   Sitting balance-Leahy Scale: Good       Standing balance-Leahy Scale: Fair                                Pertinent Vitals/Pain Pain Assessment: Faces Faces Pain Scale: Hurts little more Pain Location: scrotum Pain Descriptors / Indicators: Tender;Tightness;Sore Pain Intervention(s): Monitored during session;Repositioned   Orthostatic VS for the past 24 hrs (Last 3 readings):  BP- Lying Pulse- Lying BP- Sitting Pulse- Sitting BP- Standing at 0 minutes Pulse- Standing at 0 minutes BP- Standing at 3 minutes Pulse- Standing at 3 minutes  08/23/17 1500 97/73 65 93/59 58 93/53 35 92/61 61      Home Living Family/patient expects to be discharged to:: Private residence Living Arrangements: Alone Available Help at Discharge: Family;Available PRN/intermittently(nephew who works) Type of Home: Apartment Home Access: Elevator     Home Layout: One level Home Equipment: Environmental consultant - 2 wheels;Cane - single point      Prior Function Level of Independence: Independent               Hand Dominance        Extremity/Trunk Assessment        Lower Extremity Assessment Lower Extremity Assessment: Overall WFL for tasks assessed    Cervical / Trunk Assessment Cervical / Trunk Assessment: Normal  Communication   Communication: No difficulties  Cognition Arousal/Alertness: Lethargic Behavior During Therapy: WFL for tasks assessed/performed Overall Cognitive Status: No family/caregiver present to determine baseline cognitive functioning  General Comments: little groggy reports remaining from sedation for his procedure      General Comments General comments (skin integrity, edema, etc.): took orthostatic BP's     Exercises     Assessment/Plan    PT Assessment Patient needs continued PT services  PT Problem List Decreased knowledge of use of DME;Decreased mobility;Decreased safety awareness;Decreased knowledge of precautions       PT Treatment Interventions DME instruction;Therapeutic activities;Gait training;Therapeutic  exercise;Patient/family education;Balance training;Functional mobility training    PT Goals (Current goals can be found in the Care Plan section)  Acute Rehab PT Goals Patient Stated Goal: Agreeable to walk PT Goal Formulation: With patient Time For Goal Achievement: 09/06/17 Potential to Achieve Goals: Good    Frequency Min 3X/week   Barriers to discharge        Co-evaluation               AM-PAC PT "6 Clicks" Daily Activity  Outcome Measure Difficulty turning over in bed (including adjusting bedclothes, sheets and blankets)?: A Little Difficulty moving from lying on back to sitting on the side of the bed? : A Little Difficulty sitting down on and standing up from a chair with arms (e.g., wheelchair, bedside commode, etc,.)?: A Little Help needed moving to and from a bed to chair (including a wheelchair)?: A Little Help needed walking in hospital room?: A Little Help needed climbing 3-5 steps with a railing? : A Lot 6 Click Score: 17    End of Session Equipment Utilized During Treatment: Gait belt Activity Tolerance: Patient tolerated treatment well Patient left: in chair;with call bell/phone within reach;with chair alarm set   PT Visit Diagnosis: Other abnormalities of gait and mobility (R26.89)    Time: 1419-1440 PT Time Calculation (min) (ACUTE ONLY): 21 min   Charges:   PT Evaluation $PT Eval Low Complexity: 1 Low     PT G CodesMagda Kiel, [place] [phone] 08/23/2017   Reginia Naas 08/23/2017, 3:55 PM

## 2017-08-23 NOTE — Progress Notes (Signed)
PROGRESS NOTE    Jose [name]  TOI:[id number] DOB: [birth year] DOA: 08/22/2017 PCP: [name], MD    Brief Narrative:  Jose [name] is a 62 y.o. male with medical history significant of history of liver cirrhosis with ascites, history of hypertension, history of hepatitis C, history of depression, alcohol dependence, chronic gastritis, thrombocytopenia, and other comorbidities who presents to [facility] hospital with a chief complaint of lower extremity swelling. He states that his scrotum was severely fluid-filled and which brought him to the ED for evaluation.  Patient states that as worse in the last few days.  Scrotum is very painful because of the fluid.  TRH was called to evaluate the patient patient was found to have acute kidney injury and he will be admitted.  Given his history of liver cirrhosis there is questionable hepatorenal syndrome.      Assessment & Plan:   Active Problems:   Anasarca   Thrombocytopenia (HCC)   Alcoholism (HCC)   Alcoholic cirrhosis of liver with ascites (HCC)   Elevated LFTs   Gastroesophageal reflux disease without esophagitis   Protein-calorie malnutrition, severe (HCC)   Anemia of chronic disease   Hepatitis C, chronic (HCC)   End stage liver disease (HCC)   Pancytopenia, acquired (HCC)   Hyponatremia   Polysubstance abuse (HCC)   Anemia   Decompensation of cirrhosis of liver (HCC)   AKI (acute kidney injury) (HCC)   Scrotal edema   AKI / volume overload sec to decompensated liver failure from liver cirrhosis and impending hepato renal syndrome: -  AKI suspect from hepato renal syndrome. - diuresis with lasix and spironolactone and US guided paracentesis for ascites.  - renal ultrasound does not show obstruction.  - echocardiogram to rule out CHF.  - resume rifaximin and lactulose for liver cirrhosis to prevent hepatic encephalopathy.  - strict diet control and dietary will consulted.  - strict intake and output.     Scrotal swelling:  secon to fluid overload from decompensated liver failure.  No testicular torsion.  Watch for improvement with diuresis.   Alcohol absue: Ongoing.  CIWA on board.    Tobacco abuse:  On nicotine patch.   Polysubstance abuse:  Counseled by admit physician  Social work for resources.   Hypertension.  Well controlled.    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   Pancytopenia:  Possibly from liver cirrhosis.  Transfuse to keep hemoglobin greater than 7.  Platelets around 65,000.         DVT prophylaxis:scd's Code Status: full code.  Family Communication: family at bedside.  Disposition Plan: pending resolution of the ascites.    Consultants:   None.   Procedures:   US GUIDED paracentesis.    Antimicrobials: none.    Subjective: Asking if his swelling in the scrotum will improve.  No chest pain or sob.  No abd pain, no nausea, or vomiting.   Objective: Vitals:   08/23/17 1136 08/23/17 1147 08/23/17 1458 08/23/17 1500  BP: 137/68 125/71 110/73 108/67  Pulse:   (!) 49 (!) 50  Resp:    18  Temp:   98.5 F (36.9 C)   TempSrc:   Oral   SpO2: 100% 100% 100% 100%  Weight:      Height:        Intake/Output Summary (Last 24 hours) at 08/23/2017 1809 Last data filed at 08/23/2017 1600 Gross per 24 hour  Intake 360 ml  Output 1750 ml  Net -1390  ml   Filed Weights   08/22/17 0944 08/22/17 1715  Weight: 84.8 kg (187 lb) 86.1 kg (189 lb 13.1 oz)    Examination:  General exam: Appears calm and comfortable  Respiratory system: Clear to auscultation. Respiratory effort normal. Cardiovascular system: S1 & S2 heard, RRR. No JVD,  Gastrointestinal system: Abdomen is soft distended, bowel sounds good.  Central nervous system: Alert and oriented. No focal neurological deficits. Extremities: Symmetric 5 x 5 power. Skin: No rashes, lesions or ulcers Psychiatry:  Mood & affect appropriate.     Data Reviewed: I  have personally reviewed following labs and imaging studies  CBC: Recent Labs  Lab 08/22/17 1211 08/23/17 0506  WBC 4.1 3.6*  NEUTROABS 2.7 2.5  HGB 8.4* 7.7*  HCT 26.1* 23.7*  MCV 85.6 83.7  PLT 72* 65*   Basic Metabolic Panel: Recent Labs  Lab 08/22/17 1211 08/23/17 0506  NA 132* 134*  K 3.7 3.4*  CL 102 104  CO2 17* 19*  GLUCOSE 121* 148*  BUN 32* 34*  CREATININE 3.80* 3.68*  CALCIUM 8.1* 7.9*  MG 1.9  --   PHOS 4.1  --    GFR: Estimated Creatinine Clearance: 21.7 mL/min (A) (by C-G formula based on SCr of 3.68 mg/dL (H)). Liver Function Tests: Recent Labs  Lab 08/22/17 1211 08/23/17 0506  AST 91* 73*  ALT 54 46  ALKPHOS 133* 152*  BILITOT 3.4* 2.7*  PROT 5.6* 5.0*  ALBUMIN 2.5* 2.2*   No results for input(s): LIPASE, AMYLASE in the last 168 hours. No results for input(s): AMMONIA in the last 168 hours. Coagulation Profile: Recent Labs  Lab 08/22/17 1211  INR 1.57   Cardiac Enzymes: No results for input(s): CKTOTAL, CKMB, CKMBINDEX, TROPONINI in the last 168 hours. BNP (last 3 results) No results for input(s): PROBNP in the last 8760 hours. HbA1C: No results for input(s): HGBA1C in the last 72 hours. CBG: Recent Labs  Lab 08/23/17 0805  GLUCAP 130*   Lipid Profile: No results for input(s): CHOL, HDL, LDLCALC, TRIG, CHOLHDL, LDLDIRECT in the last 72 hours. Thyroid Function Tests: Recent Labs    08/22/17 1932  TSH 3.306   Anemia Panel: No results for input(s): VITAMINB12, FOLATE, FERRITIN, TIBC, IRON, RETICCTPCT in the last 72 hours. Sepsis Labs: No results for input(s): PROCALCITON, LATICACIDVEN in the last 168 hours.  Recent Results (from the past 240 hour(s))  Culture, Urine     Status: None   Collection Time: 08/22/17  3:11 PM  Result Value Ref Range Status   Specimen Description   Final    URINE, RANDOM Performed at [facility] [address], [place], [place] [address]    Special Requests   Final     NONE Performed at [facility], [place] [address] [place], [place] [address]    Culture   Final    NO GROWTH Performed at [facility], [place] [address], [place], [place] [address]    Report Status 08/23/2017 FINAL  Final         Radiology Studies: Dg Chest 2 View  Result Date: 08/22/2017 CLINICAL DATA:  Shortness of breath EXAM: CHEST - 2 VIEW COMPARISON:  Chest radiograph 07/15/2016 FINDINGS: Low lung volumes. Stable prominent cardiac and mediastinal contours. No consolidative pulmonary opacities. Bibasilar atelectasis. No pleural effusion or pneumothorax. Osseous structures unremarkable. IMPRESSION: No acute cardiopulmonary process. Electronically Signed   By: Lovey Newcomer M.D.   On: 08/22/2017 14:36   US Renal  Result Date: 08/22/2017 CLINICAL DATA:  Acute renal failure.  Hypertension. EXAM: RENAL / URINARY TRACT ULTRASOUND COMPLETE COMPARISON:  Liver MRI, 07/21/2017 FINDINGS: Right Kidney: Length: 12 cm. Increased parenchymal echogenicity. No mass, stone or hydronephrosis. Left Kidney: Length: 11.5 cm. Increased parenchymal echogenicity. No mass, stone or hydronephrosis. Bladder: Appears normal for degree of bladder distention. Liver with coarsened echotexture, masses in surface nodularity consistent with cirrhosis than the lesions noted on the recent prior MRI. Moderate ascites. IMPRESSION: 1. No acute renal abnormality.  No hydronephrosis. 2. Increased renal parenchymal echogenicity consistent with medical renal disease. 3. No renal masses or stones. Electronically Signed   By: Lajean Manes M.D.   On: 08/22/2017 17:25   US Paracentesis  Result Date: 08/23/2017 INDICATION: Cirrhosis with history of hepatitis C and alcohol abuse. Ascites. Request for therapeutic paracentesis. EXAM: ULTRASOUND GUIDED PARACENTESIS MEDICATIONS: 1% Lidocaine = 10 mL COMPLICATIONS: None immediate. PROCEDURE: Informed written consent was obtained from the patient after a discussion of the  risks, benefits and alternatives to treatment. A timeout was performed prior to the initiation of the procedure. Initial ultrasound scanning demonstrates a large amount of ascites within the right lower abdominal quadrant. The right lower abdomen was prepped and draped in the usual sterile fashion. 1% lidocaine with epinephrine was used for local anesthesia. Following this, a 19 gauge, 7-cm, Yueh catheter was introduced. An ultrasound image was saved for documentation purposes. The paracentesis was performed. The catheter was removed and a dressing was applied. The patient tolerated the procedure well without immediate post procedural complication. FINDINGS: A total of approximately 9.9 liters of clear yellow fluid was removed. IMPRESSION: Successful ultrasound-guided paracentesis yielding 9.9 liters of peritoneal fluid. Read by: Gareth Eagle, PA-C Electronically Signed   By: Marybelle Killings M.D.   On: 08/23/2017 11:54   US Scrotum W/doppler  Result Date: 08/22/2017 CLINICAL DATA:  Chronic testicular swelling and pain. Cirrhosis and ascites. EXAM: SCROTAL ULTRASOUND DOPPLER ULTRASOUND OF THE TESTICLES TECHNIQUE: Complete ultrasound examination of the testicles, epididymis, and other scrotal structures was performed. Color and spectral Doppler ultrasound were also utilized to evaluate blood flow to the testicles. COMPARISON:  08/05/2017 FINDINGS: Right testicle Measurements: 4.8 x 2.7 x 3.8 cm. No mass or microlithiasis visualized. Left testicle Measurements: 3.7 x 2.2 x 2.6 cm. No mass or microlithiasis visualized. Right epididymis:  Normal in size and appearance. Left epididymis:  Normal in size and appearance. Hydrocele: Large bilateral supra testicular fluid collections are seen which contain low-level internal echoes and several internal septations, and displace the testicles inferiorly. Varicocele:  None visualized. Pulsed Doppler interrogation of both testes demonstrates normal low resistance arterial and  venous waveforms bilaterally. IMPRESSION: No evidence of testicular mass or torsion. Large bilateral supra-testicular fluid collections, most likely due to fluid-filled inguinal hernias although loculated hydroceles cannot be excluded. Electronically Signed   By: Earle Gell M.D.   On: 08/22/2017 14:13        Scheduled Meds: . cholecalciferol  1,000 Units Oral Daily  . folic acid  1 mg Oral Daily  . furosemide  40 mg Intravenous BID  . lactulose  20 g Oral TID  . LORazepam  0-4 mg Intravenous Q6H   Followed by  . [START ON 08/24/2017] LORazepam  0-4 mg Intravenous Q12H  . multivitamin with minerals  1 tablet Oral Daily  . nicotine  14 mg Transdermal Daily  . oxyCODONE  5 mg Oral BID  . pantoprazole  40 mg Oral BID  . rifaximin  550 mg  Oral BID  . spironolactone  50 mg Oral Daily  . thiamine  100 mg Oral Daily   Or  . thiamine  100 mg Intravenous Daily   Continuous Infusions:   LOS: 1 day    Time spent: 45 minutes.     Hosie Poisson, MD [facility] Pager [phone]  If 7PM-7AM, please contact night-coverage [email or web address] Password [facility] 08/23/2017, 6:09 PM

## 2017-08-23 NOTE — Procedures (Signed)
PROCEDURE SUMMARY:  Successful US guided paracentesis from right lateral abdomen.  Yielded 9.9 of clear yellow fluid.  No immediate complications.  Patient tolerated well.   Finn Amos S Worthy Boschert PA-C 08/23/2017 11:54 AM

## 2017-08-23 NOTE — Progress Notes (Signed)
Patient informed RN that he is itching due to having bed bugs at home. Stated '"I need to get me a box of matches when I leave".

## 2017-08-24 DIAGNOSIS — Z515 Encounter for palliative care: Secondary | ICD-10-CM

## 2017-08-24 DIAGNOSIS — Z7189 Other specified counseling: Secondary | ICD-10-CM

## 2017-08-24 LAB — CBC WITH DIFFERENTIAL/PLATELET
Basophils Absolute: 0 10*3/uL (ref 0.0–0.1)
Basophils Relative: 0 %
EOS PCT: 0 %
Eosinophils Absolute: 0 10*3/uL (ref 0.0–0.7)
HCT: 30.9 % — ABNORMAL LOW (ref 39.0–52.0)
HEMOGLOBIN: 9.9 g/dL — AB (ref 13.0–17.0)
Lymphocytes Relative: 11 %
Lymphs Abs: 0.7 10*3/uL (ref 0.7–4.0)
MCH: 26.7 pg (ref 26.0–34.0)
MCHC: 32 g/dL (ref 30.0–36.0)
MCV: 83.3 fL (ref 78.0–100.0)
Monocytes Absolute: 0.4 10*3/uL (ref 0.1–1.0)
Monocytes Relative: 5 %
Neutro Abs: 5.7 10*3/uL (ref 1.7–7.7)
Neutrophils Relative %: 84 %
PLATELETS: 73 10*3/uL — AB (ref 150–400)
RBC: 3.71 MIL/uL — AB (ref 4.22–5.81)
RDW: 19.2 % — ABNORMAL HIGH (ref 11.5–15.5)
WBC: 6.8 10*3/uL (ref 4.0–10.5)

## 2017-08-24 LAB — COMPREHENSIVE METABOLIC PANEL
ALBUMIN: 2 g/dL — AB (ref 3.5–5.0)
ALK PHOS: 132 U/L — AB (ref 38–126)
ALT: 45 U/L (ref 17–63)
ANION GAP: 8 (ref 5–15)
AST: 72 U/L — ABNORMAL HIGH (ref 15–41)
BUN: 32 mg/dL — ABNORMAL HIGH (ref 6–20)
CALCIUM: 7.7 mg/dL — AB (ref 8.9–10.3)
CO2: 19 mmol/L — AB (ref 22–32)
Chloride: 105 mmol/L (ref 101–111)
Creatinine, Ser: 3.34 mg/dL — ABNORMAL HIGH (ref 0.61–1.24)
GFR calc Af Amer: 21 mL/min — ABNORMAL LOW (ref >60)
GFR calc non Af Amer: 18 mL/min — ABNORMAL LOW (ref >60)
GLUCOSE: 121 mg/dL — AB (ref 65–99)
Potassium: 3.8 mmol/L (ref 3.5–5.1)
SODIUM: 132 mmol/L — AB (ref 135–145)
Total Bilirubin: 2.5 mg/dL — ABNORMAL HIGH (ref 0.3–1.2)
Total Protein: 4.8 g/dL — ABNORMAL LOW (ref 6.5–8.1)

## 2017-08-24 LAB — GLUCOSE, CAPILLARY: Glucose-Capillary: 124 mg/dL — ABNORMAL HIGH (ref 65–99)

## 2017-08-24 LAB — AMMONIA: Ammonia: 57 umol/L — ABNORMAL HIGH (ref 9–35)

## 2017-08-24 MED ORDER — LIP MEDEX EX OINT
TOPICAL_OINTMENT | CUTANEOUS | Status: AC
  Filled 2017-08-24: qty 7

## 2017-08-24 MED ORDER — LACTULOSE 10 GM/15ML PO SOLN
30.0000 g | Freq: Three times a day (TID) | ORAL | Status: DC
  Administered 2017-08-24 – 2017-08-26 (×7): 30 g via ORAL
  Filled 2017-08-24 (×7): qty 60

## 2017-08-24 NOTE — Progress Notes (Signed)
Central tele monitor tech charted pt in SB with 1st degree AVB at 0721. I was not notified or informed when receiving report from night RN this morning. I noticed the finding at 0900 when charting the pt's assessment. Pt was in NSR. Dr. Karleen Hampshire was notified.

## 2017-08-24 NOTE — Progress Notes (Signed)
Nutrition Brief Note  RD consulted for "poor diet education". Discussed pt with RN. RN reports pt may not be able to retain much information. Per chart, pt can answer simple yes/no questions. Pt not appropriate for nutrition education at this time.   Wt Readings from Last 15 Encounters:  08/22/17 189 lb 13.1 oz (86.1 kg)  08/04/17 187 lb 9.8 oz (85.1 kg)  08/04/17 201 lb (91.2 kg)  07/16/17 190 lb 3.2 oz (86.3 kg)  07/13/17 181 lb 4.8 oz (82.2 kg)  07/08/17 181 lb 4.8 oz (82.2 kg)  05/15/17 192 lb (87.1 kg)  03/25/17 200 lb (90.7 kg)  02/20/17 201 lb (91.2 kg)  01/08/17 201 lb 4.8 oz (91.3 kg)  12/10/16 203 lb (92.1 kg)  11/19/16 200 lb (90.7 kg)  11/14/16 200 lb (90.7 kg)  09/02/16 194 lb (88 kg)  08/18/16 181 lb (82.1 kg)    Body mass index is 30.64 kg/m. Patient meets criteria for obese based on current BMI. Per chart pt's weight trending up/stable over the past couple months.   Current diet order is 2 gram sodium diet, patient is consuming approximately 15-80% of meals at this time. Per chart, pt's aunt is feeding patient lunch today.   Labs and medications reviewed.   No nutrition interventions warranted at this time. If nutrition issues arise, please consult RD.   Marjo Bicker, MS, RDN, LDN 08/24/2017 12:44 PM

## 2017-08-24 NOTE — Progress Notes (Signed)
PROGRESS NOTE    Jose [name]  DDU:[id number] DOB: [birth year] DOA: 08/22/2017 PCP: [name], MD    Brief Narrative:  Jose [name] is a 62 y.o. male with medical history significant of history of liver cirrhosis with ascites, history of hypertension, history of hepatitis C, history of depression, alcohol dependence, chronic gastritis, thrombocytopenia, and other comorbidities who presents to [facility] with a chief complaint of lower extremity swelling. He states that his scrotum was severely fluid-filled and which brought him to the ED for evaluation.  Patient states that as worse in the last few days.  Scrotum is very painful because of the fluid.  TRH was called to evaluate the patient patient was found to have acute kidney injury and he will be admitted for further evaluation and management.    Assessment & Plan:   Active Problems:   Anasarca   Thrombocytopenia (HCC)   Encephalopathy, hepatic (HCC)   Alcoholism (HCC)   Alcoholic cirrhosis of liver with ascites (HCC)   Elevated LFTs   Gastroesophageal reflux disease without esophagitis   Protein-calorie malnutrition, severe (HCC)   Anemia of chronic disease   Hepatitis C, chronic (HCC)   End stage liver disease (HCC)   Pancytopenia, acquired (HCC)   Hyponatremia   Polysubstance abuse (HCC)   Anemia   Decompensation of cirrhosis of liver (HCC)   AKI (acute kidney injury) (HCC)   Scrotal edema   AKI / volume overload sec to decompensated liver failure from liver cirrhosis and impending hepato renal syndrome: -  AKI suspect from hepato renal syndrome vs fluid overload. - diuresis with lasix and spironolactone and US guided paracentesis for ascites.  - renal ultrasound does not show obstruction.  - echocardiogram shows good LVEF, and severe enlargement of the left atrium and mod enlargement of the right atrium.  - resume rifaximin and lactulose for liver cirrhosis  Elevated ammonia level.  - strict diet  control and dietary will consulted.  - strict intake and output.  - nephrology consulted for recommendations.    Scrotal swelling:  secon to fluid overload from decompensated liver failure.  No testicular torsion.  Watch for improvement with diuresis.   Alcohol absue: Ongoing.  CIWA on board.    Tobacco abuse:  On nicotine patch.   Polysubstance abuse:  Counseled by admit physician  Social work for resources.   Hypertension.  Well controlled.    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   Pancytopenia:  Possibly from liver cirrhosis.  Transfuse to keep hemoglobin greater than 7.  Platelets around 65,000.  Hemoglobin around 9.   Hypokalemia:  Replaced.    Acute hepatic encephalopathy:  - elevated ammonia. Increase the dose of lactulose.  Repeat ammonia levels.   In view of the multiple co morbidities and declining mental status, clinical deterioration, recommend palliative care . Discussed with the sister at bedside.  DVT prophylaxis:scd's Code Status: full code.  Family Communication: family at bedside.  Disposition Plan: pending resolution of the ascites.    Consultants:   Nephrology.   Procedures:   US GUIDED paracentesis.    Antimicrobials: none.    Subjective: Scrotal swelling slightly improved.  No nausea or vomiting.  Just 2 bowel movements overnight.  Pt confused, lethargic.   Objective: Vitals:   08/23/17 2134 08/24/17 0544 08/24/17 1518 08/24/17 2107  BP: 101/72 115/74 107/78 103/66  Pulse: (!) 55 (!) 51 64 (!) 57  Resp: 16 16 18 18   Temp: 98.2 F (  36.8 C) 98.2 F (36.8 C) 99.2 F (37.3 C) 98.8 F (37.1 C)  TempSrc: Oral Oral Oral Oral  SpO2: 100% 100% 100% 100%  Weight:      Height:        Intake/Output Summary (Last 24 hours) at 08/24/2017 2238 Last data filed at 08/24/2017 2040 Gross per 24 hour  Intake 240 ml  Output 1250 ml  Net -1010 ml   Filed Weights   08/22/17 0944 08/22/17 1715  Weight:  84.8 kg (187 lb) 86.1 kg (189 lb 13.1 oz)    Examination:  General exam: lethargic, confused.  Respiratory system: Diminished air entry at bases. No wheezing or rhonchi.  Cardiovascular system: S1 & S2 heard, RRR. No JVD,  Gastrointestinal system: Abdomen is soft distended, bowel sounds good.  Central nervous system: lethargic, cant hold a conversation.  Extremities: Symmetric 5 x 5 power. Skin: No rashes, lesions or ulcers Psychiatry:  Mood & affect appropriate.     Data Reviewed: I have personally reviewed following labs and imaging studies  CBC: Recent Labs  Lab 08/22/17 1211 08/23/17 0506 08/24/17 1600  WBC 4.1 3.6* 6.8  NEUTROABS 2.7 2.5 5.7  HGB 8.4* 7.7* 9.9*  HCT 26.1* 23.7* 30.9*  MCV 85.6 83.7 83.3  PLT 72* 65* 73*   Basic Metabolic Panel: Recent Labs  Lab 08/22/17 1211 08/23/17 0506 08/24/17 0512  NA 132* 134* 132*  K 3.7 3.4* 3.8  CL 102 104 105  CO2 17* 19* 19*  GLUCOSE 121* 148* 121*  BUN 32* 34* 32*  CREATININE 3.80* 3.68* 3.34*  CALCIUM 8.1* 7.9* 7.7*  MG 1.9  --   --   PHOS 4.1  --   --    GFR: Estimated Creatinine Clearance: 23.9 mL/min (A) (by C-G formula based on SCr of 3.34 mg/dL (H)). Liver Function Tests: Recent Labs  Lab 08/22/17 1211 08/23/17 0506 08/24/17 0512  AST 91* 73* 72*  ALT 54 46 45  ALKPHOS 133* 152* 132*  BILITOT 3.4* 2.7* 2.5*  PROT 5.6* 5.0* 4.8*  ALBUMIN 2.5* 2.2* 2.0*   No results for input(s): LIPASE, AMYLASE in the last 168 hours. Recent Labs  Lab 08/24/17 1114  AMMONIA 57*   Coagulation Profile: Recent Labs  Lab 08/22/17 1211  INR 1.57   Cardiac Enzymes: No results for input(s): CKTOTAL, CKMB, CKMBINDEX, TROPONINI in the last 168 hours. BNP (last 3 results) No results for input(s): PROBNP in the last 8760 hours. HbA1C: No results for input(s): HGBA1C in the last 72 hours. CBG: Recent Labs  Lab 08/23/17 0805 08/24/17 0757  GLUCAP 130* 124*   Lipid Profile: No results for input(s): CHOL,  HDL, LDLCALC, TRIG, CHOLHDL, LDLDIRECT in the last 72 hours. Thyroid Function Tests: Recent Labs    08/22/17 1932  TSH 3.306   Anemia Panel: No results for input(s): VITAMINB12, FOLATE, FERRITIN, TIBC, IRON, RETICCTPCT in the last 72 hours. Sepsis Labs: No results for input(s): PROCALCITON, LATICACIDVEN in the last 168 hours.  Recent Results (from the past 240 hour(s))  Culture, Urine     Status: None   Collection Time: 08/22/17  3:11 PM  Result Value Ref Range Status   Specimen Description   Final    URINE, RANDOM Performed at [facility] [address] [place], [place] [address]    Special Requests   Final    NONE Performed at [facility], [place] [address] [place], [place] [address]    Culture   Final  NO GROWTH Performed at [facility], [place] [address] [place], [place] [address]    Report Status 08/23/2017 FINAL  Final         Radiology Studies: US Paracentesis  Result Date: 08/23/2017 INDICATION: Cirrhosis with history of hepatitis C and alcohol abuse. Ascites. Request for therapeutic paracentesis. EXAM: ULTRASOUND GUIDED PARACENTESIS MEDICATIONS: 1% Lidocaine = 10 mL COMPLICATIONS: None immediate. PROCEDURE: Informed written consent was obtained from the patient after a discussion of the risks, benefits and alternatives to treatment. A timeout was performed prior to the initiation of the procedure. Initial ultrasound scanning demonstrates a large amount of ascites within the right lower abdominal quadrant. The right lower abdomen was prepped and draped in the usual sterile fashion. 1% lidocaine with epinephrine was used for local anesthesia. Following this, a 19 gauge, 7-cm, Yueh catheter was introduced. An ultrasound image was saved for documentation purposes. The paracentesis was performed. The catheter was removed and a dressing was applied. The patient tolerated the procedure well without immediate post procedural  complication. FINDINGS: A total of approximately 9.9 liters of clear yellow fluid was removed. IMPRESSION: Successful ultrasound-guided paracentesis yielding 9.9 liters of peritoneal fluid. Read by: Gareth Eagle, PA-C Electronically Signed   By: Marybelle Killings M.D.   On: 08/23/2017 11:54        Scheduled Meds: . cholecalciferol  1,000 Units Oral Daily  . folic acid  1 mg Oral Daily  . furosemide  40 mg Intravenous BID  . lactulose  30 g Oral TID  . LORazepam  0-4 mg Intravenous Q12H  . multivitamin with minerals  1 tablet Oral Daily  . nicotine  14 mg Transdermal Daily  . oxyCODONE  5 mg Oral BID  . pantoprazole  40 mg Oral BID  . rifaximin  550 mg Oral BID  . spironolactone  50 mg Oral Daily  . thiamine  100 mg Oral Daily   Or  . thiamine  100 mg Intravenous Daily   Continuous Infusions:   LOS: 2 days    Time spent: 35 minutes.     Hosie Poisson, MD [facility] Pager [phone]  If 7PM-7AM, please contact night-coverage [email or web address] Password [facility] 08/24/2017, 10:38 PM

## 2017-08-24 NOTE — Progress Notes (Signed)
REport from [place], Therapist, sports. Care assumed for pt at 1115. Pt resting in bed, being fed lunch by his aunt. Pt very drowsy.  Able to state his name, DOB, and that he is in hospital. Answers simple yes/no questions. POC discussed w/ pt aunt at bedside.

## 2017-08-24 NOTE — Progress Notes (Signed)
Night shift RN reported off that pt mentioned having bedbugs at home. A sample was collected and environmental services/ service response was called at 0930 to address/comfirm the presence of bedbugs. Awaiting a response/callback at this time.

## 2017-08-24 NOTE — Consult Note (Signed)
Consultation Note Date: 08/24/2017   Patient Name: Jose [name]  DOB: 1956-03-19  MRN: [id number]  Age / Sex: 62 y.o., male  PCP: [name], MD Referring Physician: Hosie Poisson, MD  Reason for Consultation: Establishing goals of care  HPI/Patient Profile: 62 y.o. male  with past medical history of liver cirrhosis, recurrent ascites, ETOH abuse, gastric varices, hepatitis C, thrombocytopenia, hernia, HTN, depression admitted on 08/22/2017 with lower extremity and scrotal swelling. In ED, patient found to have acute kidney injury (BUN/creat 32/3.80). Renal ultrasound negative for obstruction. Patient being diuresed with lasix and spironolactone. Patient with hepatic encephalopathy and ammonia 57. Lactulose and rifaximin resumed 4/29. Followed by outpatient oncology for questionable liver mass/? Hepatocellular carcinoma. Palliative medicine consultation for goals of care.   Clinical Assessment and Goals of Care:  I have reviewed medical records, discussed with care team, and met with patient and sister Jose [name]) at bedside.  Patient is drowsy/lethargic likely from hepatic encephalopathy. RN at bedside giving scheduled lactulose. Patient denies pain. He is currently not able to participate in conversation.   Introduced Palliative Medicine as specialized medical care for people living with serious illness. It focuses on providing relief from the symptoms and stress of a serious illness. The goal is to improve quality of life for both the patient and the family. Patient known to PMT from previous admissions and sister, Jose [name] is also familiar with role of palliative medicine. She met with palliative provider on 08/08/17.   We discussed a brief life review of the patient. Jose [name] tells me her brother lives alone in an apartment complex. He tries to maintain his "independence" but she questions his ability to care for  himself and manage medications/chronic diseases. It was recommended that he follow-up with outpatient GI and oncology after previous hospitalization and Jose [name] tells me he has not scheduled appointments yet.   I attempted to elicit values and goals of care important to the patient. Jose [name] believes he is struggling with accepting declining health secondary to progressive liver cirrhosis. He was initially diagnosed about one year ago. He continues to drink but she does not know how often or how much.   Discussed hospital diagnoses and interventions. Explained concern with hepatorenal syndrome and that they are faced with big decisions regarding goals of care (including dialysis) with progressive liver disease. Today, Jose [name] is hopeful his mental status will improve after lactulose given/ammonia level decreases. In the past, the patient has spoken of his wishes for FULL scope treatment including resuscitation.   I reviewed previous advanced directive that is in [place]. Jose [name] understands she is documented HCPOA. Secondary is the patient's uncle, Clint (who they speak to infrequently).   Jose [name] is also worried about discharge plan. Attending mentioned to Jose [name] that it may not be safe for Mr. Baltes to return home alone on discharge. Jose [name] would be interested in pursuing nursing facility placement. I explained that social work will follow and assist with discharge plan when he is closer to discharge.   Questions and concerns were addressed. PMT  contact information given. Reassured Jose [name] of continued palliative f/u during hospitalization.     SUMMARY OF RECOMMENDATIONS    FULL code/FULL scope   Sister Jose [name]) is documented HCPOA. She speaks of him NOT following up with outpatient GI and oncology as previously instructed.   Sister hopeful for improvement in mental status after lactulose today.   Sister is worried about discharge back home where he lives alone and feels he may need skilled nursing placement.   PMT will  follow.   Code Status/Advance Care Planning:  Full code  Symptom Management:   Per attending  Palliative Prophylaxis:   Aspiration and Delirium Protocol  Additional Recommendations (Limitations, Scope, Preferences):  Full Scope Treatment  Psycho-social/Spiritual:   Desire for further Chaplaincy support:yes  Additional Recommendations: Caregiving  Support/Resources  Prognosis:   Unable to determine  Discharge Planning: To Be Determined      Primary Diagnoses: Present on Admission: . AKI (acute kidney injury) ([place]) . Alcoholic cirrhosis of liver with ascites ([place]) . Alcoholism ([place]) . Anemia . Anemia of chronic disease . Decompensation of cirrhosis of liver ([place]) . Elevated LFTs . End stage liver disease ([place]) . Gastroesophageal reflux disease without esophagitis . Hepatitis C, chronic ([place]) . Hyponatremia . Pancytopenia, acquired ([place]) . Polysubstance abuse ([place]) . Protein-calorie malnutrition, severe ([place]) . Thrombocytopenia ([place])   I have reviewed the medical record, interviewed the patient and family, and examined the patient. The following aspects are pertinent.  Past Medical History:  Diagnosis Date  . Alcohol dependence ([place])   . Ascites   . Cirrhosis ([place])   . Depression   . Esophageal varices ([place]) 07/2016   per CT  . 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 Hepatitis C    Hepatitis C  . HOH (hard of hearing)   . Hypertension   . Inguinal hernia    right  . QT prolongation   . Shortness of breath    with exertion  . Thrombocytopenia ([place])    Social History   Socioeconomic History  . Marital status: Single    Spouse name: Not on file  . Number of children: Not on file  . Years of education: Not on file  . Highest education level: Not on file  Occupational History  . Not on file  Social Needs  . Financial resource strain: Not on file  . Food insecurity:    Worry: Not on file    Inability: Not on file  . Transportation needs:     Medical: Not on file    Non-medical: Not on file  Tobacco Use  . Smoking status: Current Some Day Smoker    Packs/day: 0.12    Years: 47.00    Pack years: 5.64    Types: Cigarettes  . Smokeless tobacco: Never Used  Substance and Sexual Activity  . Alcohol use: Yes    Comment: denies any in the last few weeks  . Drug use: No    Types: Marijuana, Cocaine    Comment: Last time for cocaine a few weeks ago ;Marijuana- intermittent  . Sexual activity: Not Currently    Comment: MPOA sister and Doug senior  Lifestyle  . Physical activity:    Days per week: Not on file    Minutes per session: Not on file  . Stress: Not on file  Relationships  . Social connections:    Talks on phone: Not on file    Gets together: Not on file    Attends  religious service: Not on file    Active member of club or organization: Not on file    Attends meetings of clubs or organizations: Not on file    Relationship status: Not on file  Other Topics Concern  . Not on file  Social History Narrative   ** Merged History Encounter **       Family History  Problem Relation Age of Onset  . Breast cancer Other   . Diabetes Other   . Hypertension Other   . Breast cancer Mother   . Hypertension Mother   . Diabetes Father   . Hypertension Sister   . Heart disease Sister   . Hypertension Paternal Grandmother   . Colon cancer Neg Hx    Scheduled Meds: . cholecalciferol  1,000 Units Oral Daily  . folic acid  1 mg Oral Daily  . furosemide  40 mg Intravenous BID  . lactulose  30 g Oral TID  . lip balm      . LORazepam  0-4 mg Intravenous Q6H   Followed by  . LORazepam  0-4 mg Intravenous Q12H  . multivitamin with minerals  1 tablet Oral Daily  . nicotine  14 mg Transdermal Daily  . oxyCODONE  5 mg Oral BID  . pantoprazole  40 mg Oral BID  . rifaximin  550 mg Oral BID  . spironolactone  50 mg Oral Daily  . thiamine  100 mg Oral Daily   Or  . thiamine  100 mg Intravenous Daily   Continuous  Infusions: PRN Meds:.acetaminophen **OR** acetaminophen, diphenhydrAMINE, fluticasone, LORazepam **OR** LORazepam, ondansetron **OR** ondansetron (ZOFRAN) IV, polyvinyl alcohol, senna-docusate Medications Prior to Admission:  Prior to Admission medications   Medication Sig Start Date End Date Taking? Authorizing Provider  Cholecalciferol (VITAMIN D-3) 1000 units CAPS Take 1,000 Units by mouth daily.   Yes [provider]  fluticasone (FLONASE) 50 MCG/ACT nasal spray Place 1 spray into both nostrils daily as needed for allergies.    Yes [provider]  furosemide (LASIX) 40 MG tablet Take 1 tablet (40 mg total) by mouth daily. 08/09/17  Yes Georgette Shell, MD  hydroxypropyl methylcellulose / hypromellose (ISOPTO TEARS / GONIOVISC) 2.5 % ophthalmic solution Place 2 drops into both eyes 3 (three) times daily as needed for dry eyes.   Yes [provider]  lactulose (CHRONULAC) 10 GM/15ML solution Take 45 mLs (30 g total) by mouth 4 (four) times daily. Patient taking differently: Take 20 g by mouth 3 (three) times daily.  08/06/16  Yes Reyne Dumas, MD  Multiple Vitamin (TAB-A-VITE) TABS Take 1 tablet by mouth daily.   Yes [provider]  ondansetron (ZOFRAN) 4 MG tablet Take 1 tablet (4 mg total) by mouth every 6 (six) hours as needed for nausea. 08/08/17  Yes Georgette Shell, MD  oxyCODONE (OXY IR/ROXICODONE) 5 MG immediate release tablet Take 1 tablet (5 mg total) by mouth 2 (two) times daily. 08/08/17  Yes Georgette Shell, MD  pantoprazole (PROTONIX) 40 MG tablet Take 1 tablet (40 mg total) by mouth 2 (two) times daily. 08/08/17  Yes Georgette Shell, MD  PROAIR HFA 108 [phone] Base) MCG/ACT inhaler Inhale 2 puffs into the lungs 4 (four) times daily as needed for shortness of breath or wheezing. 04/02/17  Yes [provider]  rifaximin (XIFAXAN) 550 MG TABS tablet Take 1 tablet (550 mg total) by mouth 2 (two) times daily. 11/14/16  Yes Danis,  Kirke Corin, MD  spironolactone (  ALDACTONE) 50 MG tablet Take 1 tablet (50 mg total) by mouth daily. 08/09/17  Yes Georgette Shell, MD   No Known Allergies Review of Systems  Unable to perform ROS: Mental status change   Physical Exam  Constitutional: He is easily aroused. He appears ill.  HENT:  Head: Normocephalic and atraumatic.  Pulmonary/Chest: No accessory muscle usage. No tachypnea. No respiratory distress.  Abdominal: He exhibits distension. There is no tenderness.  Neurological: He is easily aroused.  Wakes to voice, drowsy  Skin: Skin is warm and dry.  Psychiatric: His speech is delayed. Cognition and memory are impaired. He is inattentive.  Nursing note and vitals reviewed.  Vital Signs: BP 107/78 (BP Location: Right Arm)   Pulse 64   Temp 99.2 F (37.3 C) (Oral)   Resp 18   Ht '5\' 6"'$  (1.676 m)   Wt 86.1 kg (189 lb 13.1 oz)   SpO2 100%   BMI 30.64 kg/m  Pain Scale: 0-10 POSS *See Group Information*: 1-Acceptable,Awake and alert Pain Score: 0-No pain   SpO2: SpO2: 100 % O2 Device:SpO2: 100 % O2 Flow Rate: .O2 Flow Rate (L/min): 0 L/min  IO: Intake/output summary:   Intake/Output Summary (Last 24 hours) at 08/24/2017 1630 Last data filed at 08/24/2017 1518 Gross per 24 hour  Intake 240 ml  Output 1550 ml  Net -1310 ml    LBM: Last BM Date: 08/24/17 Baseline Weight: Weight: 84.8 kg (187 lb) Most recent weight: Weight: 86.1 kg (189 lb 13.1 oz)     Palliative Assessment/Data: PPS 50%   Flowsheet Rows     Most Recent Value  Intake Tab  Referral Department  Hospitalist  Unit at Time of Referral  Med/Surg Unit  Palliative Care Primary Diagnosis  -- [liver cirrhosis, hepatorenal syndrome]  Palliative Care Type  Return patient Palliative Care  Reason for referral  Clarify Goals of Care  Date first seen by Palliative Care  08/24/17  Clinical Assessment  Palliative Performance Scale Score  50%  Psychosocial & Spiritual Assessment  Palliative Care  Outcomes  Patient/Family meeting held?  Yes  Who was at the meeting?  patient and sister  Palliative Care Outcomes  Clarified goals of care, Provided psychosocial or spiritual support, ACP counseling assistance      Time In: 1510 Time Out: 1610 Time Total: 23mn Greater than 50%  of this time was spent counseling and coordinating care related to the above assessment and plan.  Signed by:  MIhor Dow FNP-C Palliative Medicine Team  Phone: [phone]: [phone]  Please contact Palliative Medicine Team phone at [phone] questions and concerns.  For individual provider: See AShea Evans

## 2017-08-24 NOTE — Consult Note (Addendum)
Renal Service Consult Note [facility]  Jose [name] 08/24/2017 [place] Requesting Physician:  Dr Karleen Hampshire  Reason for Consult:  Acute renal failure HPI: The patient is a 62 y.o. year-old with hx of hepatitis c w cirrhosis and varices low plts hypertension depression admitted 4/27 with leg edema and scrotal edema and altered mental status in ed labs showed aki w creat 3.80 renal US neg for obstruction pt was encephalopathic w nh3 of 57 was admitted and started on lactulose and rifaximin and iv lasix on 4/29. may have a liver mass concern for [facility] as well. we are asked to see for renal failure.   Pt is awake but very lethargic and provides reasonable history is not fully oriented though denies any nsaid use      ROS  denies CP  no joint pain   no HA  no blurry vision  no rash  no diarrhea  no nausea/ vomiting  no dysuria  no difficulty voiding  no change in urine color    Past Medical History  Past Medical History:  Diagnosis Date  . Alcohol dependence ([place])   . Ascites   . Cirrhosis ([place])   . Depression   . 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 Hepatitis C    Hepatitis C  . HOH (hard of hearing)   . Hypertension   . Inguinal hernia    right  . QT prolongation   . Shortness of breath    with exertion  . Thrombocytopenia [facility])    Past Surgical History  Past Surgical History:  Procedure Laterality Date  . CIRCUMCISION    . COLONOSCOPY  march 2015   Dr. Renee Harder: nodular mucosa at appendiceal orifice, tubular adenoma, extremely poor prep  . COLONOSCOPY    . ESOPHAGOGASTRODUODENOSCOPY N/A 08/05/2017   Procedure: ESOPHAGOGASTRODUODENOSCOPY (EGD);  Surgeon: Jackquline Denmark, MD;  Location: [facility] ENDOSCOPY;  Service: Endoscopy;  Laterality: N/A;  . ESOPHAGOGASTRODUODENOSCOPY (EGD) WITH PROPOFOL N/A 07/13/2017   Procedure: ESOPHAGOGASTRODUODENOSCOPY (EGD) WITH PROPOFOL;  Surgeon: Doran Stabler, MD;   Location: [facility] ENDOSCOPY;  Service: Gastroenterology;  Laterality: N/A;  . IR PARACENTESIS  08/04/2017  . IR PARACENTESIS  08/07/2017  . TOOTH EXTRACTION N/A 05/15/2017   Procedure: DENTAL RESTORATION and multiple teeth EXTRACTIONS;  Surgeon: Diona Browner, DDS;  Location: [place];  Service: Oral Surgery;  Laterality: N/A;   Family History  Family History  Problem Relation Age of Onset  . Breast cancer Other   . Diabetes Other   . Hypertension Other   . Breast cancer Mother   . Hypertension Mother   . Diabetes Father   . Hypertension Sister   . Heart disease Sister   . Hypertension Paternal Grandmother   . Colon cancer Neg Hx    Social History  reports that he has been smoking cigarettes.  He has a 5.64 pack-year smoking history. He has never used smokeless tobacco. He reports that he drinks alcohol. He reports that he does not use drugs. Allergies No Known Allergies Home medications Prior to Admission medications   Medication Sig Start Date End Date Taking? Authorizing Provider  Cholecalciferol (VITAMIN D-3) 1000 units CAPS Take 1,000 Units by mouth daily.   Yes [provider]  fluticasone (FLONASE) 50 MCG/ACT nasal spray Place 1 spray into both nostrils daily as needed for allergies.    Yes [provider]  furosemide (LASIX) 40 MG  tablet Take 1 tablet (40 mg total) by mouth daily. 08/09/17  Yes Georgette Shell, MD  hydroxypropyl methylcellulose / hypromellose (ISOPTO TEARS / GONIOVISC) 2.5 % ophthalmic solution Place 2 drops into both eyes 3 (three) times daily as needed for dry eyes.   Yes [provider]  lactulose (CHRONULAC) 10 GM/15ML solution Take 45 mLs (30 g total) by mouth 4 (four) times daily. Patient taking differently: Take 20 g by mouth 3 (three) times daily.  08/06/16  Yes Reyne Dumas, MD  Multiple Vitamin (TAB-A-VITE) TABS Take 1 tablet by mouth daily.   Yes [provider]  ondansetron (ZOFRAN) 4 MG tablet Take 1 tablet (4 mg total)  by mouth every 6 (six) hours as needed for nausea. 08/08/17  Yes Georgette Shell, MD  oxyCODONE (OXY IR/ROXICODONE) 5 MG immediate release tablet Take 1 tablet (5 mg total) by mouth 2 (two) times daily. 08/08/17  Yes Georgette Shell, MD  pantoprazole (PROTONIX) 40 MG tablet Take 1 tablet (40 mg total) by mouth 2 (two) times daily. 08/08/17  Yes Georgette Shell, MD  PROAIR HFA 108 [phone] Base) MCG/ACT inhaler Inhale 2 puffs into the lungs 4 (four) times daily as needed for shortness of breath or wheezing. 04/02/17  Yes [provider]  rifaximin (XIFAXAN) 550 MG TABS tablet Take 1 tablet (550 mg total) by mouth 2 (two) times daily. 11/14/16  Yes Danis, Kirke Corin, MD  spironolactone (ALDACTONE) 50 MG tablet Take 1 tablet (50 mg total) by mouth daily. 08/09/17  Yes Georgette Shell, MD   Liver Function Tests Recent Labs  Lab 08/22/17 1211 08/23/17 0506 08/24/17 0512  AST 91* 73* 72*  ALT 54 46 45  ALKPHOS 133* 152* 132*  BILITOT 3.4* 2.7* 2.5*  PROT 5.6* 5.0* 4.8*  ALBUMIN 2.5* 2.2* 2.0*   No results for input(s): LIPASE, AMYLASE in the last 168 hours. CBC Recent Labs  Lab 08/22/17 1211 08/23/17 0506 08/24/17 1600  WBC 4.1 3.6* 6.8  NEUTROABS 2.7 2.5 5.7  HGB 8.4* 7.7* 9.9*  HCT 26.1* 23.7* 30.9*  MCV 85.6 83.7 83.3  PLT 72* 65* 73*   Basic Metabolic Panel Recent Labs  Lab 08/22/17 1211 08/23/17 0506 08/24/17 0512  NA 132* 134* 132*  K 3.7 3.4* 3.8  CL 102 104 105  CO2 17* 19* 19*  GLUCOSE 121* 148* 121*  BUN 32* 34* 32*  CREATININE 3.80* 3.68* 3.34*  CALCIUM 8.1* 7.9* 7.7*  PHOS 4.1  --   --    Iron/TIBC/Ferritin/ %Sat    Component Value Date/Time   IRON 20 (L) 07/12/2017 0822   TIBC 196 (L) 07/12/2017 0822   FERRITIN 333 (H) 07/08/2017 1034   FERRITIN 199 01/08/2017 1402   IRONPCTSAT 10 (L) 07/12/2017 0822    Vitals:   08/23/17 1500 08/23/17 2134 08/24/17 0544 08/24/17 1518  BP: 108/67 101/72 115/74 107/78  Pulse: (!) 50 (!) 55 (!) 51  64  Resp: '18 16 16 18  '$ Temp:  98.2 F (36.8 C) 98.2 F (36.8 C) 99.2 F (37.3 C)  TempSrc:  Oral Oral Oral  SpO2: 100% 100% 100% 100%  Weight:      Height:       Exam  frail cachectic aam groggy and listless  orient x 2, not to time "1922"  no jvd, flat neck veins, no lad  chest bibasilar crackles  cor reg 2/6 soft sem no s3  abd +sig ascites nontender periumb hernia nontender  scrotal edema  3-4+ foley cath in place  ext no sig leg edema or upper ext edema  neuro is somnolent but arouses and follows only simple commands   cxr 4/27 no acute disease  na 132 k 3.8  bun 32  cr 3.3  co2 19  egfr 20  alb 2.0  tbili 2.5 nh3 57   hb 6 wbc 9k  plt 73  renal US normal sized kidneys 11-12 cm no hydro normal echotexture  ua negative on 4/27 urine sodium 95  ucreat 75  echo w normal lvef g1dd   home meds:   lasix 40 qd/ aldactone 50 qd  lactulose 45 cc qid/ rifaximin 500 bid  ppi/ oxy IR/ zofran prn/ vitamins/ proair hfa  Impression: 1 renal failure prob acute in this patient, this is not hepatorenal with high urine na and high uop, suspect atn nonoliguric ua is negative agree with diuretics as he is vol overloaded w ascites and sig leg + scrotal edema; he is not a dialysis candidate due to severe comorbidities, if renal fxn worsens will need hospice care.  2 cirrhosis hep C etoh ascites -  this is main issue 3 altered mental status multifact including hepatic enceph 4 etoh dependence    Plan - as above  Kelly Splinter MD [employer] pager [phone]   08/24/2017, 4:56 PM

## 2017-08-24 NOTE — Progress Notes (Signed)
Physical Therapy Treatment Patient Details Name: Jose [name] MRN: [id number] DOB: Dec 20, 1955 Today's Date: 08/24/2017    History of Present Illness Jose [name] is a 62 y.o. male with medical history significant of history of liver cirrhosis with ascites, history of hypertension, history of hepatitis C, history of depression, alcohol dependence, chronic gastritis, thrombocytopenia, and other comorbidities who presents to [facility] long hospital with a chief complaint of lower extremity & scrotal swelling    PT Comments    RN reported pt having increased confusion.  Assisted OOB to amb to bathroom per pt request to have a BM.  Assisted in bathroom due to decreased cognition and poor balance. Assisted with amb in hallway with walker.  Pt required redirection and assist for balance.  Drunken gait.  Assisted back to bed.  Sister in room.   Follow Up Recommendations  No PT follow up     Equipment Recommendations  None recommended by PT    Recommendations for Other Services       Precautions / Restrictions Precautions Precautions: Fall Restrictions Weight Bearing Restrictions: No    Mobility  Bed Mobility Overal bed mobility: Needs Assistance Bed Mobility: Supine to Sit;Sit to Supine     Supine to sit: Min assist Sit to supine: Min assist   General bed mobility comments: increased assist needed due to sleepy/groggy state  Transfers Overall transfer level: Needs assistance Equipment used: Rolling walker (2 wheeled);None Transfers: Sit to/from [employer] to Stand: Min assist Stand pivot transfers: Min assist;Mod assist       General transfer comment: 75% VC's on safety with turns and redirection as pt demonstarted decreased cognition  Ambulation/Gait Ambulation/Gait assistance: Min assist Ambulation Distance (Feet): 110 Feet Assistive device: Rolling walker (2 wheeled) Gait Pattern/deviations: Step-through pattern;Wide base of support;Decreased stride  length Gait velocity: decreased   General Gait Details: slightly unsteady gait with wider BOS    Stairs             Wheelchair Mobility    Modified Rankin (Stroke Patients Only)       Balance                                            Cognition Arousal/Alertness: Awake/alert;Lethargic Behavior During Therapy: Flat affect Overall Cognitive Status: Impaired/Different from baseline Area of Impairment: Orientation;Attention;Memory;Following commands;Safety/judgement;Awareness;Problem solving                               General Comments: sleepy, groggy required redirection to stay on task      Exercises      General Comments        Pertinent Vitals/Pain Pain Assessment: Faces Faces Pain Scale: Hurts a little bit Pain Location: enlarged scrotum Pain Descriptors / Indicators: Constant;Grimacing Pain Intervention(s): Monitored during session    Home Living                      Prior Function            PT Goals (current goals can now be found in the care plan section) Progress towards PT goals: Progressing toward goals    Frequency    Min 3X/week      PT Plan Current plan remains appropriate    Co-evaluation  AM-PAC PT "6 Clicks" Daily Activity  Outcome Measure  Difficulty turning over in bed (including adjusting bedclothes, sheets and blankets)?: A Little Difficulty moving from lying on back to sitting on the side of the bed? : A Little Difficulty sitting down on and standing up from a chair with arms (e.g., wheelchair, bedside commode, etc,.)?: A Little Help needed moving to and from a bed to chair (including a wheelchair)?: A Little Help needed walking in hospital room?: A Little Help needed climbing 3-5 steps with a railing? : A Lot 6 Click Score: 17    End of Session Equipment Utilized During Treatment: Gait belt Activity Tolerance: Patient tolerated treatment well Patient  left: in bed;with call bell/phone within reach;with nursing/sitter in room Nurse Communication: Mobility status PT Visit Diagnosis: Other abnormalities of gait and mobility (R26.89)     Time: 1410-1435 PT Time Calculation (min) (ACUTE ONLY): 25 min  Charges:  $Gait Training: 8-22 mins $Therapeutic Activity: 8-22 mins                    G Codes:       Rica Koyanagi  PTA [facility]  Acute  Rehab Pager      [phone]

## 2017-08-25 DIAGNOSIS — K7031 Alcoholic cirrhosis of liver with ascites: Secondary | ICD-10-CM

## 2017-08-25 LAB — COMPREHENSIVE METABOLIC PANEL
ALK PHOS: 119 U/L (ref 38–126)
ALT: 45 U/L (ref 17–63)
ANION GAP: 9 (ref 5–15)
AST: 58 U/L — ABNORMAL HIGH (ref 15–41)
Albumin: 2.1 g/dL — ABNORMAL LOW (ref 3.5–5.0)
BUN: 39 mg/dL — ABNORMAL HIGH (ref 6–20)
CALCIUM: 8.3 mg/dL — AB (ref 8.9–10.3)
CHLORIDE: 106 mmol/L (ref 101–111)
CO2: 22 mmol/L (ref 22–32)
Creatinine, Ser: 3.15 mg/dL — ABNORMAL HIGH (ref 0.61–1.24)
GFR calc non Af Amer: 20 mL/min — ABNORMAL LOW (ref >60)
GFR, EST AFRICAN AMERICAN: 23 mL/min — AB (ref >60)
Glucose, Bld: 103 mg/dL — ABNORMAL HIGH (ref 65–99)
POTASSIUM: 3.4 mmol/L — AB (ref 3.5–5.1)
SODIUM: 137 mmol/L (ref 135–145)
Total Bilirubin: 2.9 mg/dL — ABNORMAL HIGH (ref 0.3–1.2)
Total Protein: 5.4 g/dL — ABNORMAL LOW (ref 6.5–8.1)

## 2017-08-25 LAB — CBC
HCT: 28.6 % — ABNORMAL LOW (ref 39.0–52.0)
Hemoglobin: 9.2 g/dL — ABNORMAL LOW (ref 13.0–17.0)
MCH: 26.7 pg (ref 26.0–34.0)
MCHC: 32.2 g/dL (ref 30.0–36.0)
MCV: 82.9 fL (ref 78.0–100.0)
PLATELETS: 75 10*3/uL — AB (ref 150–400)
RBC: 3.45 MIL/uL — ABNORMAL LOW (ref 4.22–5.81)
RDW: 19.4 % — AB (ref 11.5–15.5)
WBC: 5.7 10*3/uL (ref 4.0–10.5)

## 2017-08-25 LAB — AMMONIA: Ammonia: 51 umol/L — ABNORMAL HIGH (ref 9–35)

## 2017-08-25 LAB — GLUCOSE, CAPILLARY: GLUCOSE-CAPILLARY: 100 mg/dL — AB (ref 65–99)

## 2017-08-25 MED ORDER — OXYCODONE HCL 5 MG PO TABS: 5.0000 mg | ORAL_TABLET | Freq: Two times a day (BID) | ORAL | Status: DC | PRN

## 2017-08-25 MED ORDER — POTASSIUM CHLORIDE CRYS ER 20 MEQ PO TBCR
40.0000 meq | EXTENDED_RELEASE_TABLET | Freq: Once | ORAL | Status: AC
  Administered 2017-08-25: 40 meq via ORAL
  Filled 2017-08-25: qty 2

## 2017-08-25 NOTE — Plan of Care (Signed)
  Problem: Education: Goal: Knowledge of General Education information will improve Outcome: Progressing   Problem: Health Behavior/Discharge Planning: Goal: Ability to manage health-related needs will improve Outcome: Progressing   Problem: Clinical Measurements: Goal: Ability to maintain clinical measurements within normal limits will improve Outcome: Progressing Note:  Ammonia level improving.  Goal: Diagnostic test results will improve Outcome: Progressing   Problem: Activity: Goal: Risk for activity intolerance will decrease Outcome: Progressing   Problem: Coping: Goal: Level of anxiety will decrease Outcome: Progressing   Problem: Elimination: Goal: Will not experience complications related to urinary retention Outcome: Progressing   Problem: Pain Managment: Goal: General experience of comfort will improve Outcome: Progressing   Problem: Safety: Goal: Ability to remain free from injury will improve Outcome: Progressing   Problem: Skin Integrity: Goal: Risk for impaired skin integrity will decrease Outcome: Progressing

## 2017-08-25 NOTE — Progress Notes (Signed)
Daily Progress Note   Patient Name: Jose [name]       Date: 08/25/2017 DOB: 07-02-1955  Age: 62 y.o. MRN#: [id number] Attending Physician: Hosie Poisson, MD Primary Care Physician: [name], MD Admit Date: 08/22/2017  Reason for Consultation/Follow-up: Establishing goals of care  Subjective: Upon arrival to room, patient awake, alert, oriented and watching tv. Denies pain or discomfort. No family at bedside.   I introduced myself with palliative medicine and the role of palliative medicine with underlying chronic disease.   Patient remembers meeting Judson Roch, PMT NP last hospitalization at [facility].   I updated him on course of hospitalization including diagnoses and interventions. He speaks of finding out about liver cirrhosis a few months ago. He tells me he could get a liver transplant in one year. I explained worsening liver cirrhosis secondary to recurrent ascites. I also explained that this requires proof of sobriety and a extensive workup. Patient seems to have poor insight of this.   We discussed him completing advanced directives packet last hospitalization. He confirmed his sister (faye) as documented HCPOA. I asked if he had spoke further of his wishes in regards to heroic measures (resuscitation and life support). Keval tells me he WOULD want these interventions attempted. I explained the importance of continuing conversations regarding goals with his sister and care team.   Updated on plan for continued diuresis with hopes of improving kidney functions and fluid overload.   Length of Stay: 3  Current Medications: Scheduled Meds:  . cholecalciferol  1,000 Units Oral Daily  . folic acid  1 mg Oral Daily  . furosemide  40 mg Intravenous BID  . lactulose  30 g Oral TID   . LORazepam  0-4 mg Intravenous Q12H  . multivitamin with minerals  1 tablet Oral Daily  . nicotine  14 mg Transdermal Daily  . pantoprazole  40 mg Oral BID  . rifaximin  550 mg Oral BID  . spironolactone  50 mg Oral Daily  . thiamine  100 mg Oral Daily   Or  . thiamine  100 mg Intravenous Daily    Continuous Infusions:   PRN Meds: acetaminophen **OR** acetaminophen, diphenhydrAMINE, fluticasone, ondansetron **OR** ondansetron (ZOFRAN) IV, oxyCODONE, polyvinyl alcohol, senna-docusate  Physical Exam  Constitutional: He is oriented to person, place, and time. He is cooperative.  HENT:  Head: Normocephalic and atraumatic.  Pulmonary/Chest: No accessory muscle usage. No tachypnea. No respiratory distress.  Abdominal: He exhibits distension and ascites. There is no tenderness.  Neurological: He is alert and oriented to person, place, and time.  Skin: Skin is warm and dry.  Psychiatric: He has a normal mood and affect. His speech is normal and behavior is normal.  Nursing note and vitals reviewed.          Vital Signs: BP 135/86 (BP Location: Left Arm)   Pulse (!) 55   Temp 98.1 F (36.7 C) (Oral)   Resp 18   Ht 5\' 6"  (1.676 m)   Wt 73.5 kg (162 lb 0.6 oz)   SpO2 100%   BMI 26.15 kg/m  SpO2: SpO2: 100 % O2 Device: O2 Device: Room Air O2 Flow Rate: O2 Flow Rate (L/min): 0 L/min  Intake/output summary:   Intake/Output Summary (Last 24 hours) at 08/25/2017 1710 Last data filed at 08/25/2017 1500 Gross per 24 hour  Intake 630 ml  Output 1200 ml  Net -570 ml   LBM: Last BM Date: 08/25/17 Baseline Weight: Weight: 84.8 kg (187 lb) Most recent weight: Weight: 73.5 kg (162 lb 0.6 oz)       Palliative Assessment/Data: PPS 50%   Flowsheet Rows     Most Recent Value  Intake Tab  Referral Department  Hospitalist  Unit at Time of Referral  Med/Surg Unit  Palliative Care Primary Diagnosis  -- [liver cirrhosis, hepatorenal syndrome]  Palliative Care Type  Return patient  Palliative Care  Reason for referral  Clarify Goals of Care  Date first seen by Palliative Care  08/24/17  Clinical Assessment  Palliative Performance Scale Score  50%  Psychosocial & Spiritual Assessment  Palliative Care Outcomes  Patient/Family meeting held?  Yes  Who was at the meeting?  patient and sister  Palliative Care Outcomes  Clarified goals of care, Provided psychosocial or spiritual support, ACP counseling assistance      Patient Active Problem List   Diagnosis Date Noted  . AKI (acute kidney injury) ([place]) 08/22/2017  . Scrotal edema 08/22/2017  . Palliative care by specialist   . Decompensation of cirrhosis of liver ([place])   . Anemia 08/05/2017  . Polysubstance abuse ([place]) 08/04/2017  . Upper GI bleed 08/04/2017  . Acute anemia 07/13/2017  . Symptomatic anemia 07/12/2017  . Hyponatremia 07/12/2017  . Smoker 01/08/2017  . Pancytopenia, acquired ([place]) 01/08/2017  . Pain in right leg 10/06/2016  . End stage liver disease ([place]) 09/10/2016  . Palliative care encounter   . Goals of care, counseling/discussion   . Liver mass, right lobe   . 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 Hepatitis C, chronic ([place]) 01/04/2016  . Right inguinal hernia 09/12/2015  . Essential hypertension 09/12/2015  . Gastroesophageal reflux disease without esophagitis 09/12/2015  . Protein-calorie malnutrition, severe ([place]) 09/12/2015  . Anemia of chronic disease 09/12/2015  . Arthritis of left knee 09/12/2015  . Elevated LFTs   . Alcoholic cirrhosis of liver with ascites ([place]) 07/25/2015  . Macrocytic anemia 07/23/2015  . Alcoholism ([place]) 07/12/2015  . Encephalopathy, hepatic ([place]) 06/26/2015  . Adenomatous polyps 10/18/2013  . Unspecified vitamin D deficiency 07/13/2013  . Atopic dermatitis 04/28/2013  . Thrombocytopenia ([place]) 04/28/2013  . Anasarca 04/26/2013    Palliative Care Assessment & Plan   Patient Profile: 62 y.o. male  with past medical history of liver cirrhosis, recurrent  ascites, ETOH abuse, gastric varices, hepatitis C, thrombocytopenia, hernia, HTN, depression admitted  on 08/22/2017 with lower extremity and scrotal swelling. In ED, patient found to have acute kidney injury (BUN/creat 32/3.80). Renal ultrasound negative for obstruction. Patient being diuresed with lasix and spironolactone. Patient with hepatic encephalopathy and ammonia 57. Lactulose and rifaximin resumed 4/29. Followed by outpatient oncology for questionable liver mass/? Hepatocellular carcinoma. Palliative medicine consultation for goals of care.   Assessment: Decompensated ETOH liver cirrhosis Recurrent ascites Anasarca Acute kidney injury Scrotal edema Polysubstance abuse Hepatic encephalopathy Thrombocytopenia Protein-calorie malnutrition Anemia of chronic disease Hepatitis C, chronic  Recommendations/Plan:  Improvement in cognitive status today. Patient confirms he completed AD packet with sister [name]) as documented HCPOA. He wishes for continued FULL code/FULL scope.   No family at bedside during my visit. PMT will continue to follow and further discuss goals and expectations with irreversible, decompensated liver cirrhosis.   Sister is worried about discharge back home where he lives alone and feels he may need SNF.   Goals of Care and Additional Recommendations:  Limitations on Scope of Treatment: Full Scope Treatment  Code Status: FULL   Code Status Orders  (From admission, onward)        Start     Ordered   08/22/17 1719  Full code  Continuous     08/22/17 1719    Code Status History    Date Active Date Inactive Code Status Order ID Comments User Context   08/04/2017 1600 08/08/2017 1651 Full Code [id number]  [name], MD ED   07/12/2017 1234 07/15/2017 1615 Full Code [id number]  Hosie Poisson, MD Inpatient   08/14/2016 1547 08/18/2016 1903 Full Code [id number]  Samella Parr, NP Inpatient   07/30/2016 1918 08/06/2016 2021 Full Code [id number]  Domenic Polite, MD  Inpatient   07/15/2016 2009 07/18/2016 1740 Full Code [id number]  Etta Quill, DO ED   06/06/2016 1549 06/10/2016 2056 Full Code [id number]  Edwin Dada, MD Inpatient   05/10/2016 2308 05/11/2016 2136 Full Code [id number]  Ivor Costa, MD ED   04/26/2016 1228 04/28/2016 1638 Full Code [id number]  Cristal Ford, DO ED   09/04/2015 0111 09/07/2015 1645 Full Code [id number]  Rise Patience, MD Inpatient   07/23/2015 2253 07/29/2015 1607 Full Code [id number]  Vianne Bulls, MD ED   06/26/2015 2349 06/29/2015 1815 Full Code [id number]  Theressa Millard, MD Inpatient   04/26/2013 0628 04/29/2013 1847 Full Code [id number]  Bynum Bellows, MD ED    Advance Directive Documentation     Most Recent Value  Type of Advance Directive  Healthcare Power of Attorney  Pre-existing out of facility DNR order (yellow form or pink MOST form)  -  "MOST" Form in Place?  -       Prognosis:   Unable to determine  Discharge Planning:  To Be Determined  Care plan was discussed with patient, multidisciplinary team rounds  Thank you for allowing the Palliative Medicine Team to assist in the care of this patient.   Time In: 1645 Time Out: 1520 Total Time 37min Prolonged Time Billed  no      Greater than 50%  of this time was spent counseling and coordinating care related to the above assessment and plan.  Ihor Dow, FNP-C Palliative Medicine Team  Phone: [phone] Fax: [phone]  Please contact Palliative Medicine Team phone at [phone] for questions and concerns.

## 2017-08-25 NOTE — Progress Notes (Signed)
[facility] Progress Note  Subjective: more alert no c/o today  Vitals:   08/24/17 2107 08/25/17 0500 08/25/17 0536 08/25/17 1357  BP: 103/66  107/60 135/86  Pulse: (!) 57  (!) 51 (!) 55  Resp: 18  16 18   Temp: 98.8 F (37.1 C)  98.2 F (36.8 C) 98.1 F (36.7 C)  TempSrc: Oral  Oral Oral  SpO2: 100%  100% 100%  Weight:  73.5 kg (162 lb 0.6 oz)    Height:        Inpatient medications: . cholecalciferol  1,000 Units Oral Daily  . folic acid  1 mg Oral Daily  . furosemide  40 mg Intravenous BID  . lactulose  30 g Oral TID  . LORazepam  0-4 mg Intravenous Q12H  . multivitamin with minerals  1 tablet Oral Daily  . nicotine  14 mg Transdermal Daily  . pantoprazole  40 mg Oral BID  . rifaximin  550 mg Oral BID  . spironolactone  50 mg Oral Daily  . thiamine  100 mg Oral Daily   Or  . thiamine  100 mg Intravenous Daily    acetaminophen **OR** acetaminophen, diphenhydrAMINE, fluticasone, ondansetron **OR** ondansetron (ZOFRAN) IV, oxyCODONE, polyvinyl alcohol, senna-docusate  Exam: frail cachectic aam awakens but falls asleep during conversation but better than yest  orient x 2  no jvd, flat neck veins, no lad  chest bibasilar crackles  cor reg 2/6 soft sem no s3  abd +sig ascites nontender periumb hernia nontender  scrotal edema 3-4+ foley cath in place  ext trace leg edema no upper ext edema  neuro responds to commands, O x 2   cxr 4/27 no acute disease  renal US normal sized kidneys 11-12 cm no hydro normal echotexture  ua negative on 4/27 urine sodium 95  ucreat 75  echo w normal lvef g1dd   home meds:   lasix 40 qd/ aldactone 50 qd  lactulose 45 cc qid/ rifaximin 500 bid  ppi/ oxy IR/ zofran prn/ vitamins/ proair hfa  Impression: 1 renal failure - prob acute in this patient,creat down slightly today this is not hepatorenal with high urine na and high uop, suspect atn nonoliguric ua is negative; he is not a dialysis candidate due to severe  comorbidities, if renal fxn worsens will need hospice care.  2 cirrhosis hep C etoh ascites -  this is main issue 3 altered mental status multifact including hepatic enceph 4 etoh dependence 5 vol excess cont iv lasix forty bid  Plan - will follow   Kelly Splinter MD [facility] pager [phone]   08/25/2017, 4:26 PM   Recent Labs  Lab 08/22/17 1211 08/23/17 0506 08/24/17 0512 08/25/17 0817  NA 132* 134* 132* 137  K 3.7 3.4* 3.8 3.4*  CL 102 104 105 106  CO2 17* 19* 19* 22  GLUCOSE 121* 148* 121* 103*  BUN 32* 34* 32* 39*  CREATININE 3.80* 3.68* 3.34* 3.15*  CALCIUM 8.1* 7.9* 7.7* 8.3*  PHOS 4.1  --   --   --    Recent Labs  Lab 08/23/17 0506 08/24/17 0512 08/25/17 0817  AST 73* 72* 58*  ALT 46 45 45  ALKPHOS 152* 132* 119  BILITOT 2.7* 2.5* 2.9*  PROT 5.0* 4.8* 5.4*  ALBUMIN 2.2* 2.0* 2.1*   Recent Labs  Lab 08/22/17 1211 08/23/17 0506 08/24/17 1600 08/25/17 0513  WBC 4.1 3.6* 6.8 5.7  NEUTROABS 2.7 2.5 5.7  --   HGB 8.4* 7.7* 9.9*  9.2*  HCT 26.1* 23.7* 30.9* 28.6*  MCV 85.6 83.7 83.3 82.9  PLT 72* 65* 73* 75*   Iron/TIBC/Ferritin/ %Sat    Component Value Date/Time   IRON 20 (L) 07/12/2017 0822   TIBC 196 (L) 07/12/2017 0822   FERRITIN 333 (H) 07/08/2017 1034   FERRITIN 199 01/08/2017 1402   IRONPCTSAT 10 (L) 07/12/2017 7048

## 2017-08-25 NOTE — Progress Notes (Signed)
PROGRESS NOTE    ANGUS AMINI  UUV:[id number] DOB: 24-Apr-1956 DOA: 08/22/2017 PCP: [name], MD    Brief Narrative:  Jose [name] is a 62 y.o. male with medical history significant of history of liver cirrhosis with ascites, history of hypertension, history of hepatitis C, history of depression, alcohol dependence, chronic gastritis, thrombocytopenia, and other comorbidities who presents to [facility] long hospital with a chief complaint of lower extremity swelling. He states that his scrotum was severely fluid-filled and which brought him to the ED for evaluation.  Patient states that as worse in the last few days.  Scrotum is very painful because of the fluid.  TRH was called to evaluate the patient patient was found to have acute kidney injury and he will be admitted for further evaluation and management.    Assessment & Plan:   Active Problems:   Anasarca   Thrombocytopenia (HCC)   Encephalopathy, hepatic (HCC)   Alcoholism (HCC)   Alcoholic cirrhosis of liver with ascites (HCC)   Elevated LFTs   Gastroesophageal reflux disease without esophagitis   Protein-calorie malnutrition, severe (HCC)   Anemia of chronic disease   Hepatitis C, chronic (HCC)   End stage liver disease (HCC)   Pancytopenia, acquired (HCC)   Hyponatremia   Polysubstance abuse (HCC)   Anemia   Decompensation of cirrhosis of liver (HCC)   AKI (acute kidney injury) (HCC)   Scrotal edema   AKI / volume overload sec to decompensated liver failure from liver cirrhosis and impending hepato renal syndrome: -  AKI suspect from ATN vs fluid overload. - diuresis with lasix and spironolactone and US guided paracentesis for ascites, 6 li t taken out.  - renal ultrasound does not show obstruction.  - echocardiogram shows good LVEF, and severe enlargement of the left atrium and mod enlargement of the right atrium.  - resume rifaximin and lactulose for liver cirrhosis  Elevated ammonia level.  - strict diet  control and dietary will consulted.  - strict intake and output.  - nephrology consulted for recommendations, appreciate the recommendations.    Scrotal swelling:  secon to fluid overload from decompensated liver failure.  No testicular torsion.  Watch for improvement with diuresis.   Alcohol absue: Ongoing.  CIWA on board.  No signs of withdrawal.    Tobacco abuse:  On nicotine patch.   Polysubstance abuse:  Counseled by admit physician  Social work for resources.   Hypertension.  Well controlled.    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   Pancytopenia:  Possibly from liver cirrhosis.  Transfuse to keep hemoglobin greater than 7.  Platelets around 65,000.  Hemoglobin around 9.   Hypokalemia:  Replaced.    Acute hepatic encephalopathy:  - elevated ammonia. Increase the dose of lactulose.  Repeat ammonia level is 51   Hypokalemia: replaced.   In view of the multiple co morbidities and declining mental status, clinical deterioration, recommend palliative care . Discussed with the sister at bedside.  DVT prophylaxis:scd's Code Status: full code.  Family Communication: none at bedside today.  Disposition Plan: pending resolution of the ascites and palliative care consult.    Consultants:   Nephrology.   Palliative care.   Procedures:   US GUIDED paracentesis.    Antimicrobials: none.    Subjective: He is more alert today, denies any new complaints.   Objective: Vitals:   08/24/17 2107 08/25/17 0500 08/25/17 0536 08/25/17 1357  BP: 103/66  107/60 135/86  Pulse: Marland Kitchen)  57  (!) 51 (!) 55  Resp: 18  16 18   Temp: 98.8 F (37.1 C)  98.2 F (36.8 C) 98.1 F (36.7 C)  TempSrc: Oral  Oral Oral  SpO2: 100%  100% 100%  Weight:  73.5 kg (162 lb 0.6 oz)    Height:        Intake/Output Summary (Last 24 hours) at 08/25/2017 1655 Last data filed at 08/25/2017 1500 Gross per 24 hour  Intake 630 ml  Output 1200 ml  Net -570 ml    Filed Weights   08/22/17 0944 08/22/17 1715 08/25/17 0500  Weight: 84.8 kg (187 lb) 86.1 kg (189 lb 13.1 oz) 73.5 kg (162 lb 0.6 oz)    Examination:  General exam:alert and comfortable today, not in distress .  Respiratory system: Diminished air entry at bases. No wheezing or rhonchi.  Cardiovascular system: S1 & S2 heard, RRR. No JVD, no pedal edema.  Gastrointestinal system: Abdomen is soft distended , non tender bowel sounds good, significant scrotal swelling present.  Central nervous system:alert and oriented to place and person nt to time. Non focal.  Extremities: Symmetric 5 x 5 power. No pedal edema, no cyanosis or clubbing.  Skin: No rashes, lesions or ulcers Psychiatry:  Mood & affect appropriate.     Data Reviewed: I have personally reviewed following labs and imaging studies  CBC: Recent Labs  Lab 08/22/17 1211 08/23/17 0506 08/24/17 1600 08/25/17 0513  WBC 4.1 3.6* 6.8 5.7  NEUTROABS 2.7 2.5 5.7  --   HGB 8.4* 7.7* 9.9* 9.2*  HCT 26.1* 23.7* 30.9* 28.6*  MCV 85.6 83.7 83.3 82.9  PLT 72* 65* 73* 75*   Basic Metabolic Panel: Recent Labs  Lab 08/22/17 1211 08/23/17 0506 08/24/17 0512 08/25/17 0817  NA 132* 134* 132* 137  K 3.7 3.4* 3.8 3.4*  CL 102 104 105 106  CO2 17* 19* 19* 22  GLUCOSE 121* 148* 121* 103*  BUN 32* 34* 32* 39*  CREATININE 3.80* 3.68* 3.34* 3.15*  CALCIUM 8.1* 7.9* 7.7* 8.3*  MG 1.9  --   --   --   PHOS 4.1  --   --   --    GFR: Estimated Creatinine Clearance: 22.2 mL/min (A) (by C-G formula based on SCr of 3.15 mg/dL (H)). Liver Function Tests: Recent Labs  Lab 08/22/17 1211 08/23/17 0506 08/24/17 0512 08/25/17 0817  AST 91* 73* 72* 58*  ALT 54 46 45 45  ALKPHOS 133* 152* 132* 119  BILITOT 3.4* 2.7* 2.5* 2.9*  PROT 5.6* 5.0* 4.8* 5.4*  ALBUMIN 2.5* 2.2* 2.0* 2.1*   No results for input(s): LIPASE, AMYLASE in the last 168 hours. Recent Labs  Lab 08/24/17 1114 08/25/17 0817  AMMONIA 57* 51*   Coagulation  Profile: Recent Labs  Lab 08/22/17 1211  INR 1.57   Cardiac Enzymes: No results for input(s): CKTOTAL, CKMB, CKMBINDEX, TROPONINI in the last 168 hours. BNP (last 3 results) No results for input(s): PROBNP in the last 8760 hours. HbA1C: No results for input(s): HGBA1C in the last 72 hours. CBG: Recent Labs  Lab 08/23/17 0805 08/24/17 0757 08/25/17 0814  GLUCAP 130* 124* 100*   Lipid Profile: No results for input(s): CHOL, HDL, LDLCALC, TRIG, CHOLHDL, LDLDIRECT in the last 72 hours. Thyroid Function Tests: Recent Labs    08/22/17 1932  TSH 3.306   Anemia Panel: No results for input(s): VITAMINB12, FOLATE, FERRITIN, TIBC, IRON, RETICCTPCT in the last 72 hours. Sepsis Labs: No results for input(s): PROCALCITON, LATICACIDVEN  in the last 168 hours.  Recent Results (from the past 240 hour(s))  Culture, Urine     Status: None   Collection Time: 08/22/17  3:11 PM  Result Value Ref Range Status   Specimen Description   Final    URINE, RANDOM Performed at [facility] [address] [place], [place] [address]    Special Requests   Final    NONE Performed at [facility], [place] [address] [place], [place] [address]    Culture   Final    NO GROWTH Performed at [facility], [place] [address], [place],  [address]    Report Status 08/23/2017 FINAL  Final         Radiology Studies: No results found.      Scheduled Meds: . cholecalciferol  1,000 Units Oral Daily  . folic acid  1 mg Oral Daily  . furosemide  40 mg Intravenous BID  . lactulose  30 g Oral TID  . LORazepam  0-4 mg Intravenous Q12H  . multivitamin with minerals  1 tablet Oral Daily  . nicotine  14 mg Transdermal Daily  . pantoprazole  40 mg Oral BID  . rifaximin  550 mg Oral BID  . spironolactone  50 mg Oral Daily  . thiamine  100 mg Oral Daily   Or  . thiamine  100 mg Intravenous Daily   Continuous Infusions:   LOS: 3 days    Time spent:  35 minutes.     Hosie Poisson, MD [facility] Pager [phone]  If 7PM-7AM, please contact night-coverage [email or web address] Password [email or web address] 08/25/2017, 4:55 PM

## 2017-08-26 DIAGNOSIS — R601 Generalized edema: Secondary | ICD-10-CM

## 2017-08-26 DIAGNOSIS — F1020 Alcohol dependence, uncomplicated: Secondary | ICD-10-CM

## 2017-08-26 DIAGNOSIS — N179 Acute kidney failure, unspecified: Secondary | ICD-10-CM

## 2017-08-26 DIAGNOSIS — K7031 Alcoholic cirrhosis of liver with ascites: Secondary | ICD-10-CM

## 2017-08-26 DIAGNOSIS — K7290 Hepatic failure, unspecified without coma: Secondary | ICD-10-CM

## 2017-08-26 LAB — BASIC METABOLIC PANEL
Anion gap: 9 (ref 5–15)
BUN: 40 mg/dL — AB (ref 6–20)
CALCIUM: 8.2 mg/dL — AB (ref 8.9–10.3)
CO2: 22 mmol/L (ref 22–32)
CREATININE: 2.8 mg/dL — AB (ref 0.61–1.24)
Chloride: 107 mmol/L (ref 101–111)
GFR calc Af Amer: 26 mL/min — ABNORMAL LOW (ref >60)
GFR, EST NON AFRICAN AMERICAN: 23 mL/min — AB (ref >60)
GLUCOSE: 128 mg/dL — AB (ref 65–99)
Potassium: 3.6 mmol/L (ref 3.5–5.1)
Sodium: 138 mmol/L (ref 135–145)

## 2017-08-26 LAB — AMMONIA: AMMONIA: 71 umol/L — AB (ref 9–35)

## 2017-08-26 MED ORDER — LACTULOSE 10 GM/15ML PO SOLN
30.0000 g | Freq: Four times a day (QID) | ORAL | Status: DC
  Administered 2017-08-26 – 2017-08-28 (×8): 30 g via ORAL
  Filled 2017-08-26 (×8): qty 60

## 2017-08-26 NOTE — Progress Notes (Signed)
Physical Therapy Treatment Patient Details Name: Jose [name] MRN: [id number] DOB: [birth year] Today's Date: 08/26/2017    History of Present Illness Jose [name] is a 62 y.o. male with medical history significant of history of liver cirrhosis with ascites, history of hypertension, history of hepatitis C, history of depression, alcohol dependence, chronic gastritis, thrombocytopenia, and other comorbidities who presents to [facility] long hospital with a chief complaint of lower extremity & scrotal swelling    PT Comments     Assisted OOB to amb to bathroom per pt request to void. + blood reported to RN. Assisted in bathroom due to decreased cognition and poor balance. Assisted with amb in hallway with walker.  Pt required redirection and assist for balance.  Drunken gait.  Assisted back to bed.   Follow Up Recommendations  No PT follow up     Equipment Recommendations  None recommended by PT    Recommendations for Other Services     Precautions / Restrictions Precautions Precautions: Fall Restrictions Weight Bearing Restrictions: No    Mobility  Bed Mobility Overal bed mobility: Needs Assistance Bed Mobility: Supine to Sit;Sit to Supine Supine to sit: Min assist Sit to supine: Min assist General bed mobility comments: increased assist needed due to sleepy/groggy state  Transfers Overall transfer level: Needs assistance Equipment used: Rolling walker (2 wheeled);None Transfers: Sit to/from [employer] to Stand: Min assist Stand pivot transfers: Min assist;Mod assist General transfer comment: 75% VC's on safety with turns and redirection as pt demonstarted decreased cognition  Ambulation/Gait Ambulation/Gait assistance: Min assist Ambulation Distance (Feet): 110 Feet Assistive device: Rolling walker (2 wheeled) Gait Pattern/deviations: Step-through pattern;Wide base of support;Decreased stride length Gait velocity: decreased General  Gait Details: slightly unsteady gait with wider BOS    Stairs   Wheelchair Mobility  Modified Rankin (Stroke Patients Only)     Balance    Cognition Arousal/Alertness: Awake/alert;Lethargic Behavior During Therapy: Flat affect Overall Cognitive Status: Impaired/Different from baseline Area of Impairment: Orientation;Attention;Memory;Following commands;Safety/judgement;Awareness;Problem solving General Comments: sleepy, groggy required redirection to stay on task    Exercises    General Comments      Pertinent Vitals/Pain Pain Assessment: Faces Faces Pain Scale: Hurts a little bit Pain Location: enlarged scrotum Pain Descriptors / Indicators: Constant;Grimacing Pain Intervention(s): Monitored during session    Home Living      Prior Function       PT Goals (current goals can now be found in the care plan section) Progress towards PT goals: Progressing toward goals    Frequency    Min 3X/week      PT Plan Current plan remains appropriate    Co-evaluation    AM-PAC PT "6 Clicks" Daily Activity  Outcome Measure  Difficulty turning over in bed (including adjusting bedclothes, sheets and blankets)?: A Little Difficulty moving from lying on back to sitting on the side of the bed? : A Little Difficulty sitting down on and standing up from a chair with arms (e.g., wheelchair, bedside commode, etc,.)?: A Little Help needed moving to and from a bed to chair (including a wheelchair)?: A Little Help needed walking in hospital room?: A Little Help needed climbing 3-5 steps with a railing? : A Lot 6 Click Score: 17    End of Session Equipment Utilized During Treatment: Gait belt Activity Tolerance: Patient tolerated treatment well Patient left: in bed;with call bell/phone within reach;with nursing/sitter in room Nurse Communication: Mobility status PT Visit Diagnosis: Other abnormalities of gait and mobility (R26.89)  Time: 11:30 - 11:42 PT Time Calculation (min) (ACUTE ONLY): 25 min  Charges:  $Gait Training: 8-22 mins                                                                                                                                      G Codes:       Rica Koyanagi  PTA [facility]  Acute  Rehab Pager      [phone]

## 2017-08-26 NOTE — Progress Notes (Addendum)
[facility] Progress Note  S: pt is lethargic this am can barely keep eyes open disoriented and unable to answer questions well did however ask me to close door as I was leaving  Vitals:   08/25/17 0536 08/25/17 1357 08/25/17 2000 08/26/17 0442  BP: 107/60 135/86 127/84 122/88  Pulse: (!) 51 (!) 55 (!) 57 (!) 57  Resp: 16 18 18 20   Temp: 98.2 F (36.8 C) 98.1 F (36.7 C) 98.4 F (36.9 C) 97.6 F (36.4 C)  TempSrc: Oral Oral Oral Oral  SpO2: 100% 100% 100% 100%  Weight:      Height:        Inpatient medications: . cholecalciferol  1,000 Units Oral Daily  . folic acid  1 mg Oral Daily  . furosemide  40 mg Intravenous BID  . lactulose  30 g Oral TID  . LORazepam  0-4 mg Intravenous Q12H  . multivitamin with minerals  1 tablet Oral Daily  . nicotine  14 mg Transdermal Daily  . pantoprazole  40 mg Oral BID  . rifaximin  550 mg Oral BID  . spironolactone  50 mg Oral Daily  . thiamine  100 mg Oral Daily   Or  . thiamine  100 mg Intravenous Daily    acetaminophen **OR** acetaminophen, diphenhydrAMINE, fluticasone, ondansetron **OR** ondansetron (ZOFRAN) IV, oxyCODONE, polyvinyl alcohol, senna-docusate  Exam:  frail cachectic aam awakens but falls asleep very easily  orient x 2 "1920"  no jvd, flat neck veins, no lad  chest clear bilat  cor reg 2/6 soft sem no s3  abd +sig ascites nontender periumb hernia nontender  scrotal edema 3+ foley cath in place  ext no leg edema or upper ext edema   cxr 4/27 no acute disease  renal US normal sized kidneys 11-12 cm no hydro normal echotexture  ua negative on 4/27 urine sodium 95  ucreat 75  echo w normal lvef g1dd   home meds:   lasix 40 qd/ aldactone 50 qd  lactulose 45 cc qid/ rifaximin 500 bid  ppi/ oxy IR/ zofran prn/ vitamins/ proair hfa  Impression: 1 acute renal failure - acute, not hepatorenal with high urine na and high uop, suspect atn, ua and [place] negative; labs pending today but has been gradually  improving not sig vol overloaded will dc iv lasix, scrotal edema isolated as pt not vol overloaded so will dc iv lasix, may be related to cirrhosis; not a dialysis candidate due to severe comorbidities, if renal fxn worsens will need hospice care 2 cirrhosis hep C etoh ascites - main issue 3 altered mental status multifact including hepatic enceph 4 etoh dependence 5 scrotal edema unclear cause will d/w primary 6 dispo - very poor prognosis w advance liver disease  Plan - will follow   Kelly Splinter MD [facility] pager [phone]   08/26/2017, 8:59 AM   Recent Labs  Lab 08/22/17 1211 08/23/17 0506 08/24/17 0512 08/25/17 0817  NA 132* 134* 132* 137  K 3.7 3.4* 3.8 3.4*  CL 102 104 105 106  CO2 17* 19* 19* 22  GLUCOSE 121* 148* 121* 103*  BUN 32* 34* 32* 39*  CREATININE 3.80* 3.68* 3.34* 3.15*  CALCIUM 8.1* 7.9* 7.7* 8.3*  PHOS 4.1  --   --   --    Recent Labs  Lab 08/23/17 0506 08/24/17 0512 08/25/17 0817  AST 73* 72* 58*  ALT 46 45 45  ALKPHOS 152* 132* 119  BILITOT 2.7* 2.5* 2.9*  PROT 5.0* 4.8* 5.4*  ALBUMIN 2.2* 2.0* 2.1*   Recent Labs  Lab 08/22/17 1211 08/23/17 0506 08/24/17 1600 08/25/17 0513  WBC 4.1 3.6* 6.8 5.7  NEUTROABS 2.7 2.5 5.7  --   HGB 8.4* 7.7* 9.9* 9.2*  HCT 26.1* 23.7* 30.9* 28.6*  MCV 85.6 83.7 83.3 82.9  PLT 72* 65* 73* 75*   Iron/TIBC/Ferritin/ %Sat    Component Value Date/Time   IRON 20 (L) 07/12/2017 0822   TIBC 196 (L) 07/12/2017 0822   FERRITIN 333 (H) 07/08/2017 1034   FERRITIN 199 01/08/2017 1402   IRONPCTSAT 10 (L) 07/12/2017 1610

## 2017-08-26 NOTE — Progress Notes (Signed)
PROGRESS NOTE [facility]   ERNST CUMPSTON   WNI:[id number] DOB: 09-11-1955  DOA: 08/22/2017 PCP: [name], MD   Brief Narrative:  Jose [name] is a 62 year old male with medical history significant for liver cirrhosis with ascites, hypertension, hepatitis C, alcohol abuse, depression and thrombocytopenia presented to the emergency department complaining of lower extremity and scrotal swelling.  Upon evaluation patient was found to have acute renal failure and patient was admitted with working diagnosis of fluid overload.  Hospital stay has been complicated with encephalopathy.   Subjective: Patient seen and examined, seems to be more awake and interactive this morning per nursing staff.  Remains afebrile, no acute events overnight.  Lower extremity edema has resolved.  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 Nephrology's discontinuing IV Lasix, as there is no significant signs of fluid overload. Patient not candidate for dialysis and nephrology is recommending hospice care if renal function worsen.  Anasarca with scrotal swelling Likely from decompensated liver cirrhosis.  Continue Aldactone Overall swelling has improved, except  scrotal swelling.  Urology consulted.  Scrotal elevation. Continue supportive treatment  Liver cirrhosis with ascites Decompensated, status post ultrasound-guided paracentesis 6 L removed Elevated ammonia level, increase lactulose Continue rifaximin Echo shows good LVEF, severe enlargement of left atrium and moderate enlargement of right atrium Strict I&O's Discussed with patient and family member, that there is no other treatment options for this time other than supportive treatment.  Patient would not be candidate for liver transplant given ongoing alcohol abuse.  Poor prognosis. Palliative care recommendations appreciated  Alcohol abuse CIWA No  signs of withdrawal  Gastric varices Continue PPI twice daily No signs of active bleed at this point  Pancytopenia From liver cirrhosis Hemoglobin remains stable Continue to monitor platelets  Acute hepatic encephalopathy Ammonia elevated Increased dose of lactulose Check ammonia levels in a.m.  DVT prophylaxis: SCDs Code Status: Full code Family Communication: Niece at bedside Disposition Plan: Pending PT  Consultants:   Nephrology  Palliative care  Urology  Procedures:   Ultrasound-guided  paracentesis  Antimicrobials:  None   Objective: Vitals:   08/25/17 0536 08/25/17 1357 08/25/17 2000 08/26/17 0442  BP: 107/60 135/86 127/84 122/88  Pulse: (!) 51 (!) 55 (!) 57 (!) 57  Resp: 16 18 18 20   Temp: 98.2 F (36.8 C) 98.1 F (36.7 C) 98.4 F (36.9 C) 97.6 F (36.4 C)  TempSrc: Oral Oral Oral Oral  SpO2: 100% 100% 100% 100%  Weight:      Height:        Intake/Output Summary (Last 24 hours) at 08/26/2017 1411 Last data filed at 08/26/2017 1100 Gross per 24 hour  Intake 480 ml  Output 750 ml  Net -270 ml   Filed Weights   08/22/17 0944 08/22/17 1715 08/25/17 0500  Weight: 84.8 kg (187 lb) 86.1 kg (189 lb 13.1 oz) 73.5 kg (162 lb 0.6 oz)    Examination:  General exam: NAD HEENT: Jaundice Respiratory system: Clear to auscultation. No wheezes,crackle or rhonchi Cardiovascular system: S1 & S2 heard, RRR. No JVD, murmurs, rubs or gallops Gastrointestinal system: Abdomen distended, nontender, no fluid wave identified Genitourinary: Scrotal swelling, unable to identify penile shaft Central nervous system: Oriented x3, no focal deficit Extremities: No lower extremity edema Skin: No rashes  Data Reviewed: I have personally reviewed following labs and imaging studies  CBC: Recent Labs  Lab 08/22/17 1211 08/23/17 0506 08/24/17 1600 08/25/17 0513  WBC 4.1 3.6*  6.8 5.7  NEUTROABS 2.7 2.5 5.7  --   HGB 8.4* 7.7* 9.9* 9.2*  HCT 26.1* 23.7* 30.9* 28.6*   MCV 85.6 83.7 83.3 82.9  PLT 72* 65* 73* 75*   Basic Metabolic Panel: Recent Labs  Lab 08/22/17 1211 08/23/17 0506 08/24/17 0512 08/25/17 0817 08/26/17 0843  NA 132* 134* 132* 137 138  K 3.7 3.4* 3.8 3.4* 3.6  CL 102 104 105 106 107  CO2 17* 19* 19* 22 22  GLUCOSE 121* 148* 121* 103* 128*  BUN 32* 34* 32* 39* 40*  CREATININE 3.80* 3.68* 3.34* 3.15* 2.80*  CALCIUM 8.1* 7.9* 7.7* 8.3* 8.2*  MG 1.9  --   --   --   --   PHOS 4.1  --   --   --   --    GFR: Estimated Creatinine Clearance: 25 mL/min (A) (by C-G formula based on SCr of 2.8 mg/dL (H)). Liver Function Tests: Recent Labs  Lab 08/22/17 1211 08/23/17 0506 08/24/17 0512 08/25/17 0817  AST 91* 73* 72* 58*  ALT 54 46 45 45  ALKPHOS 133* 152* 132* 119  BILITOT 3.4* 2.7* 2.5* 2.9*  PROT 5.6* 5.0* 4.8* 5.4*  ALBUMIN 2.5* 2.2* 2.0* 2.1*   No results for input(s): LIPASE, AMYLASE in the last 168 hours. Recent Labs  Lab 08/24/17 1114 08/25/17 0817 08/26/17 0454  AMMONIA 57* 51* 71*   Coagulation Profile: Recent Labs  Lab 08/22/17 1211  INR 1.57   Cardiac Enzymes: No results for input(s): CKTOTAL, CKMB, CKMBINDEX, TROPONINI in the last 168 hours. BNP (last 3 results) No results for input(s): PROBNP in the last 8760 hours. HbA1C: No results for input(s): HGBA1C in the last 72 hours. CBG: Recent Labs  Lab 08/23/17 0805 08/24/17 0757 08/25/17 0814  GLUCAP 130* 124* 100*   Lipid Profile: No results for input(s): CHOL, HDL, LDLCALC, TRIG, CHOLHDL, LDLDIRECT in the last 72 hours. Thyroid Function Tests: No results for input(s): TSH, T4TOTAL, FREET4, T3FREE, THYROIDAB in the last 72 hours. Anemia Panel: No results for input(s): VITAMINB12, FOLATE, FERRITIN, TIBC, IRON, RETICCTPCT in the last 72 hours. Sepsis Labs: No results for input(s): PROCALCITON, LATICACIDVEN in the last 168 hours.  Recent Results (from the past 240 hour(s))  Culture, Urine     Status: None   Collection Time: 08/22/17  3:11 PM    Result Value Ref Range Status   Specimen Description   Final    URINE, RANDOM Performed at [facility] [address] [place], [place] [address]    Special Requests   Final    NONE Performed at [facility], [place] [address], [place], [place] [address]    Culture   Final    NO GROWTH Performed at [facility], [place] [address] [place], [place] [address]    Report Status 08/23/2017 FINAL  Final     Radiology Studies: No results found.   Scheduled Meds: . cholecalciferol  1,000 Units Oral Daily  . folic acid  1 mg Oral Daily  . lactulose  30 g Oral Q6H  . LORazepam  0-4 mg Intravenous Q12H  . multivitamin with minerals  1 tablet Oral Daily  . nicotine  14 mg Transdermal Daily  . pantoprazole  40 mg Oral BID  . rifaximin  550 mg Oral BID  . spironolactone  50 mg Oral Daily  . thiamine  100 mg Oral Daily   Or  . thiamine  100 mg Intravenous Daily  Continuous Infusions:   LOS: 4 days    Time spent: Total of 25 minutes spent with pt, greater than 50% of which was spent in discussion of  treatment, counseling and coordination of care   Chipper Oman, MD Pager: Text Page via [email or web address]   If 7PM-7AM, please contact night-coverage [email or web address] 08/26/2017, 2:11 PM   Note - This record has been created using [employer]. Chart creation errors have been sought, but may not always have been located. Such creation errors do not reflect on the standard of medical care.

## 2017-08-26 NOTE — Progress Notes (Signed)
Daily Progress Note   Patient Name: Jose [name]       Date: 08/26/2017 DOB: 06/30/1955  Age: 62 y.o. MRN#: [id number] Attending Physician: Patrecia Pour, Christean Grief, MD Primary Care Physician: [name], MD Admit Date: 08/22/2017  Reason for Consultation/Follow-up: Establishing goals of care  Subjective: Patient drowsy but will wake to voice and answer questions for me. Denies pain or discomfort. Family including mother, aunt, and sister at bedside.  Sister, [name] requests to speak with me privately. Her uncle spoke with MD this morning and she states her understanding that his "organs are shutting down." We discussed hospital diagnoses, interventions, and underlying chronic, progressive liver cirrhosis. Explained worsening cirrhosis with recurrent ascites and fluid overload. [name] understands he is not a candidate for liver transplant. We discussed long-term poor prognosis with decompensated liver. We also discussed MOST form in detail including medical recommendation against heroic measures with end-stage liver disease, including resuscitation and life support.   [name] has a good understanding of diagnoses, interventions, poor prognosis, and medical recommendation for DNR/DNI. She tells me she has had multiple conversations with her brother regarding wishes and he has always said "do everything, do everything." He has always been insistent on FULL code. [name] tells me she will continue to have conversations with her brother regarding diagnoses, prognosis, and his wishes. She does want to include him in decision making as long as he can and also respect his decision by keeping him a full code until he is unable to make decisions for himself.   [name] again speaks of her concerns with discharge home  where he lives alone. We discussed palliative and hospice options on discharge. Educated on hospice philosophy with goal of comfort, quality, and dignity at EOL. Also focus on symptom management, including paracentesis as needed for comfort. [name] is interested in pursuing social work consult to see if he could qualify for a SNF bed under Medicaid and with support of hospice services.   [name] wishes to have a conversation with her brother, her, and I regarding MOST form. When I went to bedside, patient requested to use the restroom. When I returned to the room shortly after, family at bedside had left and patient resting comfortably in bed. [name] not answering her cell phone.   Length of Stay: 4  Current Medications: Scheduled Meds:  .  cholecalciferol  1,000 Units Oral Daily  . folic acid  1 mg Oral Daily  . lactulose  30 g Oral Q6H  . LORazepam  0-4 mg Intravenous Q12H  . multivitamin with minerals  1 tablet Oral Daily  . nicotine  14 mg Transdermal Daily  . pantoprazole  40 mg Oral BID  . rifaximin  550 mg Oral BID  . spironolactone  50 mg Oral Daily  . thiamine  100 mg Oral Daily   Or  . thiamine  100 mg Intravenous Daily    Continuous Infusions:   PRN Meds: acetaminophen **OR** acetaminophen, diphenhydrAMINE, fluticasone, ondansetron **OR** ondansetron (ZOFRAN) IV, oxyCODONE, polyvinyl alcohol, senna-docusate  Physical Exam  Constitutional: He is oriented to person, place, and time. He is cooperative.  HENT:  Head: Normocephalic and atraumatic.  Pulmonary/Chest: No accessory muscle usage. No tachypnea. No respiratory distress.  Abdominal: He exhibits distension and ascites. There is no tenderness.  Neurological: He is alert and oriented to person, place, and time.  Skin: Skin is warm and dry.  Psychiatric: He has a normal mood and affect. His speech is normal and behavior is normal.  Nursing note and vitals reviewed.          Vital Signs: BP 124/87 (BP Location: Left Arm)    Pulse 73   Temp 97.8 F (36.6 C) (Oral)   Resp 18   Ht 5\' 6"  (1.676 m)   Wt 73.5 kg (162 lb 0.6 oz)   SpO2 100%   BMI 26.15 kg/m  SpO2: SpO2: 100 % O2 Device: O2 Device: Room Air O2 Flow Rate: O2 Flow Rate (L/min): 0 L/min  Intake/output summary:   Intake/Output Summary (Last 24 hours) at 08/26/2017 1532 Last data filed at 08/26/2017 1100 Gross per 24 hour  Intake 240 ml  Output 750 ml  Net -510 ml   LBM: Last BM Date: 08/25/17 Baseline Weight: Weight: 84.8 kg (187 lb) Most recent weight: Weight: 73.5 kg (162 lb 0.6 oz)       Palliative Assessment/Data: PPS 50%   Flowsheet Rows     Most Recent Value  Intake Tab  Referral Department  Hospitalist  Unit at Time of Referral  Med/Surg Unit  Palliative Care Primary Diagnosis  -- [liver cirrhosis, hepatorenal syndrome]  Date Notified  08/23/17  Palliative Care Type  Return patient Palliative Care  Reason for referral  Clarify Goals of Care  Date of Admission  08/22/17  Date first seen by Palliative Care  08/24/17  # of days Palliative referral response time  1 Day(s)  # of days IP prior to Palliative referral  1  Clinical Assessment  Palliative Performance Scale Score  50%  Psychosocial & Spiritual Assessment  Palliative Care Outcomes  Patient/Family meeting held?  Yes  Who was at the meeting?  patient and sister  Palliative Care Outcomes  Clarified goals of care, Provided psychosocial or spiritual support, ACP counseling assistance      Patient Active Problem List   Diagnosis Date Noted  . Ascites due to alcoholic cirrhosis ([place])   . AKI (acute kidney injury) ([place]) 08/22/2017  . Scrotal edema 08/22/2017  . Palliative care by specialist   . Decompensation of cirrhosis of liver ([place])   . Anemia 08/05/2017  . Polysubstance abuse ([place]) 08/04/2017  . Upper GI bleed 08/04/2017  . Acute anemia 07/13/2017  . Symptomatic anemia 07/12/2017  . Hyponatremia 07/12/2017  . Smoker 01/08/2017  . Pancytopenia, acquired ([place])  01/08/2017  . Pain in right leg  10/06/2016  . End stage liver disease ([place]) 09/10/2016  . Palliative care encounter   . Goals of care, counseling/discussion   . Liver mass, right lobe   . Acute kidney injury ([place]) 08/14/2016  . 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 Hepatitis C, chronic ([place]) 01/04/2016  . Right inguinal hernia 09/12/2015  . Essential hypertension 09/12/2015  . Gastroesophageal reflux disease without esophagitis 09/12/2015  . Protein-calorie malnutrition, severe ([place]) 09/12/2015  . Anemia of chronic disease 09/12/2015  . Arthritis of left knee 09/12/2015  . Elevated LFTs   . Alcoholic cirrhosis of liver with ascites ([place]) 07/25/2015  . Macrocytic anemia 07/23/2015  . Alcoholism ([place]) 07/12/2015  . Encephalopathy, hepatic ([place]) 06/26/2015  . Adenomatous polyps 10/18/2013  . Unspecified vitamin D deficiency 07/13/2013  . Atopic dermatitis 04/28/2013  . Thrombocytopenia ([place]) 04/28/2013  . Anasarca 04/26/2013    Palliative Care Assessment & Plan   Patient Profile: 62 y.o. male  with past medical history of liver cirrhosis, recurrent ascites, ETOH abuse, gastric varices, hepatitis C, thrombocytopenia, hernia, HTN, depression admitted on 08/22/2017 with lower extremity and scrotal swelling. In ED, patient found to have acute kidney injury (BUN/creat 32/3.80). Renal ultrasound negative for obstruction. Patient being diuresed with lasix and spironolactone. Patient with hepatic encephalopathy and ammonia 57. Lactulose and rifaximin resumed 4/29. Followed by outpatient oncology for questionable liver mass/? Hepatocellular carcinoma. Palliative medicine consultation for goals of care.   Assessment: Decompensated ETOH liver cirrhosis Recurrent ascites Anasarca Acute kidney injury Scrotal edema Polysubstance abuse Hepatic encephalopathy Thrombocytopenia Protein-calorie malnutrition Anemia of chronic disease Hepatitis C, chronic  Recommendations/Plan:  Continue FULL code/FULL  scope.  Sister [name]) is documented HCPOA. I spoke with her in conference room. [name] has a good understanding of diagnoses, interventions, poor prognosis, and medical recommendation for DNR/DNI with progressive liver failure. If and when he reaches the point where he is not competent to make decisions, [name] will likely agree with changing code status. At this point, she wishes to include patient in decision-making and will continue to discuss care plan and EOL wishes with him.  Educated on hospice philosophy and options.   SW consulted. HCPOA concerned about discharge home alone and wishes to pursue SNF placement with palliative or hospice services following. Patient has Medicaid.   PMT will follow.   Goals of Care and Additional Recommendations:  Limitations on Scope of Treatment: Full Scope Treatment  Code Status: FULL   Code Status Orders  (From admission, onward)        Start     Ordered   08/22/17 1719  Full code  Continuous     08/22/17 1719    Code Status History    Date Active Date Inactive Code Status Order ID Comments User Context   08/04/2017 1600 08/08/2017 1651 Full Code [id number]  [name], MD ED   07/12/2017 1234 07/15/2017 1615 Full Code [id number]  Hosie Poisson, MD Inpatient   08/14/2016 1547 08/18/2016 1903 Full Code [id number]  Samella Parr, NP Inpatient   07/30/2016 1918 08/06/2016 2021 Full Code [id number]  Domenic Polite, MD Inpatient   07/15/2016 2009 07/18/2016 1740 Full Code [id number]  Etta Quill, DO ED   06/06/2016 1549 06/10/2016 2056 Full Code [id number]  Edwin Dada, MD Inpatient   05/10/2016 2308 05/11/2016 2136 Full Code [id number]  Ivor Costa, MD ED   04/26/2016 1228 04/28/2016 1638 Full Code [id number]  Cristal Ford, DO ED   09/04/2015 0111 09/07/2015 1645 Full Code [id number]  Rise Patience, MD Inpatient  07/23/2015 2253 07/29/2015 1607 Full Code [id number]  Vianne Bulls, MD ED   06/26/2015 2349 06/29/2015 1815 Full Code [id number]   Theressa Millard, MD Inpatient   04/26/2013 [phone] 04/29/2013 1847 Full Code [id number]  Bynum Bellows, MD ED    Advance Directive Documentation     Most Recent Value  Type of Advance Directive  Healthcare Power of Attorney  Pre-existing out of facility DNR order (yellow form or pink MOST form)  -  "MOST" Form in Place?  -       Prognosis:   Unable to determine poor prognosis with decompensated liver cirrhosis, recurrent ascites, and ARF.   Discharge Planning:  To Be Determined  Care plan was discussed with patient and sister  Thank you for allowing the Palliative Medicine Team to assist in the care of this patient.   Time In: 1500 Time Out: 1545 Total Time 62min Prolonged Time Billed  no      Greater than 50%  of this time was spent counseling and coordinating care related to the above assessment and plan.  Ihor Dow, FNP-C Palliative Medicine Team  Phone: [phone] Fax: [phone]  Please contact Palliative Medicine Team phone at [phone] for questions and concerns.

## 2017-08-26 NOTE — Progress Notes (Signed)
Patient pulled Foley out. Moderate amount of blood followed. Patient not in pain or distress. MD notified. Will continue to monitor closely.

## 2017-08-26 NOTE — Plan of Care (Signed)

## 2017-08-26 NOTE — Consult Note (Signed)
H&P Physician requesting consult: Doreatha Lew, MD  Chief Complaint: Scrotal swelling  History of Present Illness: 62 year old male with multiple medical problems currently admitted with significant liver cirrhosis with ascites, hypertension, hepatitis C, alcohol abuse and acute renal failure.  His hospital stay has been complicated with encephalopathy.  He initially had some scrotal edema as well as lower extremity edema possibly secondary to fluid overload.  Due to multiple medical comorbidities, he is not a candidate for dialysis if his renal function continues to worsen.  He is not a surgical candidate for elective surgeries as well.  Patient currently denies any scrotal pain.  He states he is voiding well without complaint.  Past Medical History:  Diagnosis Date  . Alcohol dependence ([place])   . Ascites   . Cirrhosis ([place])   . Depression   . 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 Hepatitis C    Hepatitis C  . HOH (hard of hearing)   . Hypertension   . Inguinal hernia    right  . QT prolongation   . Shortness of breath    with exertion  . Thrombocytopenia ([place])    Past Surgical History:  Procedure Laterality Date  . CIRCUMCISION    . COLONOSCOPY  march 2015   Dr. Renee Harder: nodular mucosa at appendiceal orifice, tubular adenoma, extremely poor prep  . COLONOSCOPY    . ESOPHAGOGASTRODUODENOSCOPY N/A 08/05/2017   Procedure: ESOPHAGOGASTRODUODENOSCOPY (EGD);  Surgeon: Jackquline Denmark, MD;  Location: [facility];  Service: Endoscopy;  Laterality: N/A;  . ESOPHAGOGASTRODUODENOSCOPY (EGD) WITH PROPOFOL N/A 07/13/2017   Procedure: ESOPHAGOGASTRODUODENOSCOPY (EGD) WITH PROPOFOL;  Surgeon: Doran Stabler, MD;  Location: [facility] ENDOSCOPY;  Service: Gastroenterology;  Laterality: N/A;  . IR PARACENTESIS  08/04/2017  . IR PARACENTESIS  08/07/2017  . TOOTH EXTRACTION N/A 05/15/2017   Procedure: DENTAL RESTORATION and multiple teeth EXTRACTIONS;   Surgeon: Diona Browner, DDS;  Location: [facility];  Service: Oral Surgery;  Laterality: N/A;    Home Medications:  Medications Prior to Admission  Medication Sig Dispense Refill Last Dose  . Cholecalciferol (VITAMIN D-3) 1000 units CAPS Take 1,000 Units by mouth daily.   08/22/2017 at Unknown time  . fluticasone (FLONASE) 50 MCG/ACT nasal spray Place 1 spray into both nostrils daily as needed for allergies.    08/22/2017 at Unknown time  . furosemide (LASIX) 40 MG tablet Take 1 tablet (40 mg total) by mouth daily. 30 tablet 0 08/22/2017 at Unknown time  . hydroxypropyl methylcellulose / hypromellose (ISOPTO TEARS / GONIOVISC) 2.5 % ophthalmic solution Place 2 drops into both eyes 3 (three) times daily as needed for dry eyes.   08/22/2017 at Unknown time  . lactulose (CHRONULAC) 10 GM/15ML solution Take 45 mLs (30 g total) by mouth 4 (four) times daily. (Patient taking differently: Take 20 g by mouth 3 (three) times daily. ) 500 mL 2 08/22/2017 at Unknown time  . Multiple Vitamin (TAB-A-VITE) TABS Take 1 tablet by mouth daily.   08/22/2017 at Unknown time  . ondansetron (ZOFRAN) 4 MG tablet Take 1 tablet (4 mg total) by mouth every 6 (six) hours as needed for nausea. 20 tablet 0 08/22/2017 at Unknown time  . oxyCODONE (OXY IR/ROXICODONE) 5 MG immediate release tablet Take 1 tablet (5 mg total) by mouth 2 (two) times daily. 30 tablet 0 08/22/2017 at Unknown time  . pantoprazole (PROTONIX) 40 MG tablet Take 1 tablet (40 mg total)  by mouth 2 (two) times daily. 60 tablet 0 08/22/2017 at Unknown time  . PROAIR HFA 108 (90 Base) MCG/ACT inhaler Inhale 2 puffs into the lungs 4 (four) times daily as needed for shortness of breath or wheezing.  3 08/21/2017 at Unknown time  . rifaximin (XIFAXAN) 550 MG TABS tablet Take 1 tablet (550 mg total) by mouth 2 (two) times daily. 60 tablet 6 08/22/2017 at Unknown time  . spironolactone (ALDACTONE) 50 MG tablet Take 1 tablet (50 mg total) by mouth daily. 30 tablet 0 08/22/2017 at  Unknown time   Allergies: No Known Allergies  Family History  Problem Relation Age of Onset  . Breast cancer Other   . Diabetes Other   . Hypertension Other   . Breast cancer Mother   . Hypertension Mother   . Diabetes Father   . Hypertension Sister   . Heart disease Sister   . Hypertension Paternal Grandmother   . Colon cancer Neg Hx    Social History:  reports that he has been smoking cigarettes.  He has a 5.64 pack-year smoking history. He has never used smokeless tobacco. He reports that he drinks alcohol. He reports that he does not use drugs.  ROS: A complete review of systems was performed.  All systems are negative except for pertinent findings as noted. ROS   Physical Exam:  Vital signs in last 24 hours: Temp:  [97.6 F (36.4 C)-98.4 F (36.9 C)] 97.8 F (36.6 C) (05/01 1504) Pulse Rate:  [57-73] 73 (05/01 1504) Resp:  [18-20] 18 (05/01 1504) BP: (122-127)/(84-88) 124/87 (05/01 1504) SpO2:  [100 %] 100 % (05/01 1504) General:  Alert, No acute distress HEENT: Normocephalic, atraumatic Cardiovascular: Regular rate and rhythm Lungs: Regular rate and effort Abdomen: Soft, nontender, nondistended, no abdominal masses Back: No CVA tenderness GU: Significant scrotal edema as well as retracted pain is secondary to the swelling.  There is no fluctuance or crepitus.  There is no sign of infection or overlying erythema. Extremities: No edema Neurologic: Grossly intact  Laboratory Data:  Results for orders placed or performed during the hospital encounter of 08/22/17 (from the past 24 hour(s))  Ammonia     Status: Abnormal   Collection Time: 08/26/17  4:54 AM  Result Value Ref Range   Ammonia 71 (H) 9 - 35 umol/L  Basic metabolic panel     Status: Abnormal   Collection Time: 08/26/17  8:43 AM  Result Value Ref Range   Sodium 138 135 - 145 mmol/L   Potassium 3.6 3.5 - 5.1 mmol/L   Chloride 107 101 - 111 mmol/L   CO2 22 22 - 32 mmol/L   Glucose, Bld 128 (H) 65 - 99  mg/dL   BUN 40 (H) 6 - 20 mg/dL   Creatinine, Ser 2.80 (H) 0.61 - 1.24 mg/dL   Calcium 8.2 (L) 8.9 - 10.3 mg/dL   GFR calc non Af Amer 23 (L) >60 mL/min   GFR calc Af Amer 26 (L) >60 mL/min   Anion gap 9 5 - 15   Recent Results (from the past 240 hour(s))  Culture, Urine     Status: None   Collection Time: 08/22/17  3:11 PM  Result Value Ref Range Status   Specimen Description   Final    URINE, RANDOM Performed at [facility], [place] [address], [place], [place] [address]    Special Requests   Final    NONE Performed at [facility], [place]  [address] [place], [place] [address]    Culture   Final    NO GROWTH Performed at [facility], [place] [address] [place], [place] [address]    Report Status 08/23/2017 FINAL  Final   Creatinine: Recent Labs    08/22/17 1211 08/23/17 0506 08/24/17 0512 08/25/17 0817 08/26/17 0843  CREATININE 3.80* 3.68* 3.34* 3.15* 2.80*    Impression/Assessment:  Scrotal edema  Plan:  Recommend scrotal support and elevation.  No intervention necessary.  Thank you for the consult  Marton Redwood, III 08/26/2017, 5:43 PM

## 2017-08-27 LAB — COMPREHENSIVE METABOLIC PANEL
ALBUMIN: 2.2 g/dL — AB (ref 3.5–5.0)
ALT: 46 U/L (ref 17–63)
AST: 62 U/L — ABNORMAL HIGH (ref 15–41)
Alkaline Phosphatase: 122 U/L (ref 38–126)
Anion gap: 11 (ref 5–15)
BILIRUBIN TOTAL: 3.1 mg/dL — AB (ref 0.3–1.2)
BUN: 39 mg/dL — ABNORMAL HIGH (ref 6–20)
CO2: 20 mmol/L — AB (ref 22–32)
Calcium: 8.3 mg/dL — ABNORMAL LOW (ref 8.9–10.3)
Chloride: 105 mmol/L (ref 101–111)
Creatinine, Ser: 2.47 mg/dL — ABNORMAL HIGH (ref 0.61–1.24)
GFR calc Af Amer: 31 mL/min — ABNORMAL LOW (ref >60)
GFR calc non Af Amer: 27 mL/min — ABNORMAL LOW (ref >60)
GLUCOSE: 129 mg/dL — AB (ref 65–99)
POTASSIUM: 3.9 mmol/L (ref 3.5–5.1)
Sodium: 136 mmol/L (ref 135–145)
TOTAL PROTEIN: 5.8 g/dL — AB (ref 6.5–8.1)

## 2017-08-27 LAB — CBC WITH DIFFERENTIAL/PLATELET
BASOS ABS: 0 10*3/uL (ref 0.0–0.1)
Basophils Relative: 0 %
EOS PCT: 2 %
Eosinophils Absolute: 0.1 10*3/uL (ref 0.0–0.7)
HEMATOCRIT: 30.8 % — AB (ref 39.0–52.0)
Hemoglobin: 9.7 g/dL — ABNORMAL LOW (ref 13.0–17.0)
LYMPHS PCT: 11 %
Lymphs Abs: 0.4 10*3/uL — ABNORMAL LOW (ref 0.7–4.0)
MCH: 26.4 pg (ref 26.0–34.0)
MCHC: 31.5 g/dL (ref 30.0–36.0)
MCV: 83.9 fL (ref 78.0–100.0)
MONO ABS: 0.9 10*3/uL (ref 0.1–1.0)
MONOS PCT: 21 %
NEUTROS ABS: 2.6 10*3/uL (ref 1.7–7.7)
Neutrophils Relative %: 66 %
PLATELETS: 78 10*3/uL — AB (ref 150–400)
RBC: 3.67 MIL/uL — ABNORMAL LOW (ref 4.22–5.81)
RDW: 19.7 % — AB (ref 11.5–15.5)
WBC: 4 10*3/uL (ref 4.0–10.5)

## 2017-08-27 LAB — GLUCOSE, CAPILLARY: Glucose-Capillary: 128 mg/dL — ABNORMAL HIGH (ref 65–99)

## 2017-08-27 LAB — AMMONIA: Ammonia: 76 umol/L — ABNORMAL HIGH (ref 9–35)

## 2017-08-27 MED ORDER — FUROSEMIDE 40 MG PO TABS
40.0000 mg | ORAL_TABLET | Freq: Every day | ORAL | Status: DC
  Administered 2017-08-27 – 2017-08-28 (×2): 40 mg via ORAL
  Filled 2017-08-27 (×2): qty 1

## 2017-08-27 MED ORDER — SENNOSIDES-DOCUSATE SODIUM 8.6-50 MG PO TABS
1.0000 | ORAL_TABLET | Freq: Every day | ORAL | Status: DC | PRN
  Administered 2017-08-27: 1 via ORAL
  Filled 2017-08-27: qty 1

## 2017-08-27 NOTE — Progress Notes (Signed)
PROGRESS NOTE [facility]   Jose [name]   PZW:[id number] DOB: 1955-07-07  DOA: 08/22/2017 PCP: [name], MD   Brief Narrative:  Jose [name] is a 62 year old male with medical history significant for liver cirrhosis with ascites, hypertension, hepatitis C, alcohol abuse, depression and thrombocytopenia presented to the emergency department complaining of lower extremity and scrotal swelling.  Upon evaluation patient was found to have acute renal failure and patient was admitted with working diagnosis of fluid overload.  Hospital stay has been complicated with encephalopathy.   Subjective: Patient seen and examined, slight more sleepy today than yesterday.  Still.  Assessment & Plan: AKI Suspect to be ATN, per renal not hepatorenal syndrome. Creatinine improved with diuresis, Lasix were discontinued due to concern for intravascular depletion.  Will resume oral home Lasix and continue Aldactone.  Monitor renal function closely Patient not candidate for dialysis and nephrology is recommending hospice care if renal function worsen.  Anasarca with scrotal swelling Likely from decompensated liver cirrhosis.  Continue Aldactone Overall swelling has improved, except  scrotal swelling. Scrotal elevation. Urology input appreciated Continue supportive treatment  Liver cirrhosis with ascites Decompensated, status post ultrasound-guided paracentesis 6 L removed Ammonia continues to be elevated, patient not having frequent bowel movement. Continue lactulose 4 times a day, continue rifaximin Echo shows good LVEF, severe enlargement of left atrium and moderate enlargement of right atrium Strict I&O's Discussed with patient and family member, that there is no other treatment options for this time other than supportive treatment.  Patient would not be candidate for liver transplant given ongoing alcohol abuse.  Poor prognosis.  Alcohol abuse No signs of withdrawal  Gastric  varices Continue PPI twice daily No signs of active bleed at this point  Pancytopenia From liver cirrhosis Hemoglobin remains stable Continue to monitor platelets  Acute hepatic encephalopathy Persisting elevated ammonia levels. Continue lactulose, will add senna to help with bowel movement Continue to monitor PT recommending home health  DVT prophylaxis: SCDs Code Status: Full code Family Communication: Niece at bedside Disposition Plan: Home in 1 to 2 days if ammonia levels normalized   Consultants:   Nephrology  Palliative care  Urology  Procedures:   Ultrasound-guided  paracentesis  Antimicrobials:  None   Objective: Vitals:   08/26/17 0442 08/26/17 1504 08/26/17 2113 08/27/17 0445  BP: 122/88 124/87 111/83 124/70  Pulse: (!) 57 73 73 (!) 55  Resp: 20 18 20 18   Temp: 97.6 F (36.4 C) 97.8 F (36.6 C) 98.7 F (37.1 C) 97.7 F (36.5 C)  TempSrc: Oral Oral Oral Oral  SpO2: 100% 100% 99% 100%  Weight:      Height:        Intake/Output Summary (Last 24 hours) at 08/27/2017 1435 Last data filed at 08/27/2017 1100 Gross per 24 hour  Intake 600 ml  Output 100 ml  Net 500 ml   Filed Weights   08/22/17 0944 08/22/17 1715 08/25/17 0500  Weight: 84.8 kg (187 lb) 86.1 kg (189 lb 13.1 oz) 73.5 kg (162 lb 0.6 oz)    Examination:  General: Pt is alert, awake, not in acute distress HEENT: Jaundice Cardiovascular: RRR, S1/S2 +, no rubs, no gallops Respiratory: CTA bilaterally, no wheezing, no rhonchi Abdominal: Abdomen is distended, mild tender on the right lower quadrant.  Unable to define fluid wave Genitourinary: Scrotal swelling slightly improved, unable to identify penile shaft Extremities: no edema  Data Reviewed: I have personally reviewed following labs and imaging studies  CBC: Recent Labs  Lab 08/22/17 1211 08/23/17 0506 08/24/17 1600 08/25/17 0513 08/27/17 0442  WBC 4.1 3.6* 6.8 5.7 4.0  NEUTROABS 2.7 2.5 5.7  --  2.6  HGB 8.4* 7.7*  9.9* 9.2* 9.7*  HCT 26.1* 23.7* 30.9* 28.6* 30.8*  MCV 85.6 83.7 83.3 82.9 83.9  PLT 72* 65* 73* 75* 78*   Basic Metabolic Panel: Recent Labs  Lab 08/22/17 1211 08/23/17 0506 08/24/17 0512 08/25/17 0817 08/26/17 0843 08/27/17 0442  NA 132* 134* 132* 137 138 136  K 3.7 3.4* 3.8 3.4* 3.6 3.9  CL 102 104 105 106 107 105  CO2 17* 19* 19* 22 22 20*  GLUCOSE 121* 148* 121* 103* 128* 129*  BUN 32* 34* 32* 39* 40* 39*  CREATININE 3.80* 3.68* 3.34* 3.15* 2.80* 2.47*  CALCIUM 8.1* 7.9* 7.7* 8.3* 8.2* 8.3*  MG 1.9  --   --   --   --   --   PHOS 4.1  --   --   --   --   --    GFR: Estimated Creatinine Clearance: 28.3 mL/min (A) (by C-G formula based on SCr of 2.47 mg/dL (H)). Liver Function Tests: Recent Labs  Lab 08/22/17 1211 08/23/17 0506 08/24/17 0512 08/25/17 0817 08/27/17 0442  AST 91* 73* 72* 58* 62*  ALT 54 46 45 45 46  ALKPHOS 133* 152* 132* 119 122  BILITOT 3.4* 2.7* 2.5* 2.9* 3.1*  PROT 5.6* 5.0* 4.8* 5.4* 5.8*  ALBUMIN 2.5* 2.2* 2.0* 2.1* 2.2*   No results for input(s): LIPASE, AMYLASE in the last 168 hours. Recent Labs  Lab 08/24/17 1114 08/25/17 0817 08/26/17 0454 08/27/17 1021  AMMONIA 57* 51* 71* 76*   Coagulation Profile: Recent Labs  Lab 08/22/17 1211  INR 1.57   Cardiac Enzymes: No results for input(s): CKTOTAL, CKMB, CKMBINDEX, TROPONINI in the last 168 hours. BNP (last 3 results) No results for input(s): PROBNP in the last 8760 hours. HbA1C: No results for input(s): HGBA1C in the last 72 hours. CBG: Recent Labs  Lab 08/23/17 0805 08/24/17 0757 08/25/17 0814 08/27/17 0838  GLUCAP 130* 124* 100* 128*   Lipid Profile: No results for input(s): CHOL, HDL, LDLCALC, TRIG, CHOLHDL, LDLDIRECT in the last 72 hours. Thyroid Function Tests: No results for input(s): TSH, T4TOTAL, FREET4, T3FREE, THYROIDAB in the last 72 hours. Anemia Panel: No results for input(s): VITAMINB12, FOLATE, FERRITIN, TIBC, IRON, RETICCTPCT in the last 72  hours. Sepsis Labs: No results for input(s): PROCALCITON, LATICACIDVEN in the last 168 hours.  Recent Results (from the past 240 hour(s))  Culture, Urine     Status: None   Collection Time: 08/22/17  3:11 PM  Result Value Ref Range Status   Specimen Description   Final    URINE, RANDOM Performed at [place] [address], [place], [place] [address]    Special Requests   Final    NONE Performed at [facility], [place] [address] [place], [place] [address]    Culture   Final    NO GROWTH Performed at [facility], [place] [address], [place], [place] [address]    Report Status 08/23/2017 FINAL  Final     Radiology Studies: No results found.   Scheduled Meds: . cholecalciferol  1,000 Units Oral Daily  . folic acid  1 mg Oral Daily  . furosemide  40 mg Oral Daily  . lactulose  30 g Oral Q6H  . multivitamin with minerals  1 tablet Oral Daily  . nicotine  14 mg Transdermal Daily  . pantoprazole  40 mg Oral BID  . rifaximin  550 mg Oral BID  . spironolactone  50 mg Oral Daily  . thiamine  100 mg Oral Daily   Or  . thiamine  100 mg Intravenous Daily   Continuous Infusions:   LOS: 5 days    Time spent: Total of 25 minutes spent with pt, greater than 50% of which was spent in discussion of  treatment, counseling and coordination of care  Chipper Oman, MD Pager: Text Page via [email or web address]   If 7PM-7AM, please contact night-coverage [email or web address] 08/27/2017, 2:35 PM   Note - This record has been created using [employer]. Chart creation errors have been sought, but may not always have been located. Such creation errors do not reflect on the standard of medical care.

## 2017-08-27 NOTE — Clinical Social Work Note (Signed)
Clinical Social Work Assessment  Patient Details  Name: Jose [name] MRN: [id number] Date of Birth: 1955/05/12  Date of referral:  08/27/17               Reason for consult:  Discharge Planning, Facility Placement                Permission sought to share information with:  Family Supports Permission granted to share information::  Yes, Verbal Permission Granted  Name::     [name]  Agency::     Relationship::  Sister  Contact Information:  [phone]  Housing/Transportation Living arrangements for the past 2 months:  Apartment Source of Information:  Patient, Siblings Patient Interpreter Needed:  None Criminal Activity/Legal Involvement Pertinent to Current Situation/Hospitalization:  No - Comment as needed Significant Relationships:  Friend, Siblings, Other Family Members Lives with:  Self Do you feel safe going back to the place where you live?  Yes Need for family participation in patient care:  No (Coment)  Care giving concerns:  Patient from home alone. Patient's sister reported that prior to patient's hospitalization patient was independent at home with ADLs. Patient's sister reported that patient is having issues with his memory. Patient's sister reported that patient called 911 after having pain with hernia.  PT recommended no follow up.    Social Worker assessment / plan:  CSW spoke with patient/patient's sister and uncle at bedside regarding consult for SNF. CSW inquired about patient's skilled care needs, patient's sister reported that patient is having issues with his memory and becoming confused. CSW inquired about patient's needs for assistance with ambulation and ADLs. Patient's nurse tech reported that patient can feed self after he is sat up and that patient requires supervision with ambulation. Patient's nurse tech reported that patient may need limited assistance with dressing and bathing. CSW explained to patient/patient's family that in order to go to a SNF  patient will need to have a skilled care need. CSW explained different levels of care ALF vs SNF and in home options.CSW explained Medicaid coverage for [name] term care at a facility, patient's sister verbalized understanding. Patient reported that he plans to discharge home and he has a friend named [name] that wants to be his aide. Patient's sister inquired about patient's friends availability to be available and care for patient, patient reported that he has to talk to friend. CSW inquired about patient's interest in ALF, patient declined and reported that he is going home. CSW asked patient if CSW can provide patient's sister with ALF/in-home aide resources for future use if needed, patient agreed. CSW inquired about how patient will return home, patient reported that his sister will transport him. Patient reported that he feels safe returning home.  CSW provided patient's sister with ALF resources, [facility] [employer] and in home aide service resources.   Patient reported plan to discharge home and reported he has friends for support. CSW signing off, no other needs identified at this time.    Employment status:  Disabled (Comment on whether or not currently receiving Disability) Insurance information:  Medicaid [place] PT Recommendations:  No Follow Up Information / Referral to community resources:  Other (Comment Required)(ALF resources/In home aide services resources/[place] adult programs resource)  Patient/Family's Response to care:  Patient/patient's sister appreciative of CSW assistance with discharge planning.   Patient/Family's Understanding of and Emotional Response to Diagnosis, Current Treatment, and Prognosis:  Patient presented calm and verbalized plan to dc home with assistance  from friends. Patient's sister involved in patient's care and reported concerns about patient returning home. CSW acknowledged patient's sister's concerns and explained that patient  has a right to self determination. CSW provided patient's sister with resources to look into if needed moving forward. CSW encouraged patient's sister to speak with patient about concerns and to follow up with resources provided, patient's sister agreed.   Emotional Assessment Appearance:  Appears older than stated age Attitude/Demeanor/Rapport:  Other(Open) Affect (typically observed):  Appropriate, Calm Orientation:  Oriented to Self, Oriented to Place, Oriented to  Time, Oriented to Situation(Patient forgetful at times) Alcohol / Substance use:  Not Applicable Psych involvement (Current and /or in the community):  No (Comment)  Discharge Needs  Concerns to be addressed:  No discharge needs identified Readmission within the last 30 days:  Yes Current discharge risk:  Lives alone Barriers to Discharge:  Continued Medical Work up   [employer], LCSW 08/27/2017, 2:18 PM

## 2017-08-27 NOTE — Progress Notes (Signed)
[facility] Progress Note  S: creat down to 2.4  Vitals:   08/26/17 0442 08/26/17 1504 08/26/17 2113 08/27/17 0445  BP: 122/88 124/87 111/83 124/70  Pulse: (!) 57 73 73 (!) 55  Resp: 20 18 20 18   Temp: 97.6 F (36.4 C) 97.8 F (36.6 C) 98.7 F (37.1 C) 97.7 F (36.5 C)  TempSrc: Oral Oral Oral Oral  SpO2: 100% 100% 99% 100%  Weight:      Height:        Inpatient medications: . cholecalciferol  1,000 Units Oral Daily  . folic acid  1 mg Oral Daily  . lactulose  30 g Oral Q6H  . multivitamin with minerals  1 tablet Oral Daily  . nicotine  14 mg Transdermal Daily  . pantoprazole  40 mg Oral BID  . rifaximin  550 mg Oral BID  . spironolactone  50 mg Oral Daily  . thiamine  100 mg Oral Daily   Or  . thiamine  100 mg Intravenous Daily    acetaminophen **OR** acetaminophen, diphenhydrAMINE, fluticasone, ondansetron **OR** ondansetron (ZOFRAN) IV, oxyCODONE, polyvinyl alcohol, senna-docusate  Exam:  frail cachectic aam awakens but falls asleep  no jvd, flat neck veins, no lad  chest clear bilat  cor reg 2/6 soft sem no s3  abd +sig ascites nontender periumb hernia nontender  scrotal edema 3+ foley cath in place  ext - no leg edema or upper ext edema   cxr 4/27 no acute disease  renal US normal sized kidneys 11-12 cm no hydro normal echotexture  ua negative on 4/27 urine sodium 95  ucreat 75  echo w normal lvef g1dd   home meds:   lasix 40 qd/ aldactone 50 qd  lactulose 45 cc qid/ rifaximin 500 bid  ppi/ oxy IR/ zofran prn/ vitamins/ proair hfa  Impression: 1 acute renal failure - suspected atn, ua and [place] negative; not hepatorenal; continues to improve; stopped lasix due to concern for intravasc vol depletion; suspect that scrotal edema is a local process related to cirrhosis and not due to vol excess; patient is not a dialysis candidate due to severe comorbidities will sign off 2 cirrhosis hep C etoh ascites - main issue 3 altered mental status  multifact including hepatic enceph 4 etoh dependence 5 scrotal edema unclear cause will d/w primary 6 dispo - very poor prognosis w advance liver disease  Plan - as above   Kelly Splinter MD [facility] pager [phone]   08/27/2017, 10:00 AM   Recent Labs  Lab 08/22/17 1211  08/25/17 0817 08/26/17 0843 08/27/17 0442  NA 132*   < > 137 138 136  K 3.7   < > 3.4* 3.6 3.9  CL 102   < > 106 107 105  CO2 17*   < > 22 22 20*  GLUCOSE 121*   < > 103* 128* 129*  BUN 32*   < > 39* 40* 39*  CREATININE 3.80*   < > 3.15* 2.80* 2.47*  CALCIUM 8.1*   < > 8.3* 8.2* 8.3*  PHOS 4.1  --   --   --   --    < > = values in this interval not displayed.   Recent Labs  Lab 08/24/17 0512 08/25/17 0817 08/27/17 0442  AST 72* 58* 62*  ALT 45 45 46  ALKPHOS 132* 119 122  BILITOT 2.5* 2.9* 3.1*  PROT 4.8* 5.4* 5.8*  ALBUMIN 2.0* 2.1* 2.2*   Recent Labs  Lab 08/23/17 [phone]  08/24/17 1600 08/25/17 0513 08/27/17 0442  WBC 3.6* 6.8 5.7 4.0  NEUTROABS 2.5 5.7  --  2.6  HGB 7.7* 9.9* 9.2* 9.7*  HCT 23.7* 30.9* 28.6* 30.8*  MCV 83.7 83.3 82.9 83.9  PLT 65* 73* 75* 78*   Iron/TIBC/Ferritin/ %Sat    Component Value Date/Time   IRON 20 (L) 07/12/2017 0822   TIBC 196 (L) 07/12/2017 0822   FERRITIN 333 (H) 07/08/2017 1034   FERRITIN 199 01/08/2017 1402   IRONPCTSAT 10 (L) 07/12/2017 9407

## 2017-08-28 ENCOUNTER — Ambulatory Visit: Payer: Medicaid Other | Admitting: Hematology and Oncology

## 2017-08-28 DIAGNOSIS — D696 Thrombocytopenia, unspecified: Secondary | ICD-10-CM

## 2017-08-28 DIAGNOSIS — N5089 Other specified disorders of the male genital organs: Secondary | ICD-10-CM

## 2017-08-28 LAB — COMPREHENSIVE METABOLIC PANEL
ALT: 49 U/L (ref 17–63)
ANION GAP: 10 (ref 5–15)
AST: 65 U/L — ABNORMAL HIGH (ref 15–41)
Albumin: 2.2 g/dL — ABNORMAL LOW (ref 3.5–5.0)
Alkaline Phosphatase: 124 U/L (ref 38–126)
BUN: 39 mg/dL — ABNORMAL HIGH (ref 6–20)
CO2: 22 mmol/L (ref 22–32)
Calcium: 8.6 mg/dL — ABNORMAL LOW (ref 8.9–10.3)
Chloride: 105 mmol/L (ref 101–111)
Creatinine, Ser: 2.28 mg/dL — ABNORMAL HIGH (ref 0.61–1.24)
GFR, EST AFRICAN AMERICAN: 34 mL/min — AB (ref >60)
GFR, EST NON AFRICAN AMERICAN: 29 mL/min — AB (ref >60)
Glucose, Bld: 131 mg/dL — ABNORMAL HIGH (ref 65–99)
POTASSIUM: 3.8 mmol/L (ref 3.5–5.1)
Sodium: 137 mmol/L (ref 135–145)
Total Bilirubin: 3.5 mg/dL — ABNORMAL HIGH (ref 0.3–1.2)
Total Protein: 6 g/dL — ABNORMAL LOW (ref 6.5–8.1)

## 2017-08-28 LAB — AMMONIA: AMMONIA: 119 umol/L — AB (ref 9–35)

## 2017-08-28 MED ORDER — POLYETHYLENE GLYCOL 3350 17 G PO PACK
17.0000 g | PACK | Freq: Every day | ORAL | Status: DC
  Administered 2017-08-28: 17 g via ORAL
  Filled 2017-08-28: qty 1

## 2017-08-28 MED ORDER — LACTULOSE 10 GM/15ML PO SOLN: 30.0000 g | Freq: Four times a day (QID) | ORAL | 2 refills | Status: AC

## 2017-08-28 MED FILL — LACTULOSE 10 GM/15 ML SOLUT: 10 | 3 days supply | Qty: 500 | Fill #0

## 2017-08-28 NOTE — Discharge Instructions (Signed)
Alcoholic Liver Disease  Alcoholic liver disease happens when the liver does not work the way it should. The condition is caused by drinking too much alcohol for many years.  Follow these instructions at home:   Do not drink alcohol.   Take medicines only as told by your doctor.   Take vitamins only as told by your doctor.   Follow any diet instructions that your doctor gave you. You may need to:  ? Eat foods that have thiamine. These include whole-wheat cereals, pork, and raw vegetables.  ? Eat foods that have folic acid. These include vegetables, fruits, meats, beans, nuts, and dairy foods.  ? Eat foods that are high in carbohydrates. These include yogurt, beans, potatoes, and rice.  Contact a doctor if:   You have a fever.   You are short of breath.   You have trouble breathing.   You have bright red blood in your poop (stool).   Your poop looks like tar.   You throw up (vomit) blood.   Your skin looks more yellow, pale, or dark.   You get headaches.   You have trouble thinking.   You have trouble balancing or walking.  This information is not intended to replace advice given to you by your health care provider. Make sure you discuss any questions you have with your health care provider.  Document Released: 02/09/2009 Document Revised: 09/20/2015 Document Reviewed: 03/16/2014  Elsevier Interactive Patient Education  2018 [employer]

## 2017-08-28 NOTE — Plan of Care (Signed)
  Problem: Education: Goal: Knowledge of General Education information will improve Outcome: Adequate for Discharge   Problem: Health Behavior/Discharge Planning: Goal: Ability to manage health-related needs will improve Outcome: Adequate for Discharge   Problem: Clinical Measurements: Goal: Ability to maintain clinical measurements within normal limits will improve Outcome: Adequate for Discharge Goal: Diagnostic test results will improve Outcome: Adequate for Discharge   Problem: Activity: Goal: Risk for activity intolerance will decrease Outcome: Adequate for Discharge   Problem: Safety: Goal: Ability to remain free from injury will improve Outcome: Adequate for Discharge

## 2017-08-28 NOTE — Discharge Summary (Signed)
Physician Discharge Summary  Jose [name]  GYJ:[id number]  DOB: 04/10/1956  DOA: 08/22/2017 PCP: [name], MD  Admit date: 08/22/2017 Discharge date: 08/28/2017  Admitted From: Home Disposition: Home  Recommendations for Outpatient Follow-up:  1. Follow up with PCP in 1 week 2. Please obtain CMP/CBC in one week to monitor renal function, liver function and hemoglobin 3. Follow-up with GI 4. Check ammonia levels  Home Health: RN/aide/social worker/PT  Discharge Condition: Guarded (poor prognosis) CODE STATUS: Full code Diet recommendation: Heart Healthy   Brief/Interim Summary: For full details see H&P/Progress note, but in brief, Jose [name] is a 62 year old male with medical history significant for liver cirrhosis with ascites, hypertension, hepatitis C, ongoing alcohol abuse, depression and thrombocytopenia presented to the emergency department complaining of lower extremity and scrotal swelling.  Upon evaluation patient was found to have acute renal failure and patient was admitted with working diagnosis of fluid overload.  Hospital stay was complicated with encephalopathy.  Subjective: Patient seen and examined, he is awake alert and oriented.  Having breakfast.  Has no complaints.  Remains afebrile.  Had 4 bowel movement overnight  Discharge Diagnoses/Hospital Course:  [facility] was consulted. Suspect to be ATN, unlikely hepatorenal syndrome. Creatinine improved with diuresis, initially treated with IV Lasix which were discontinued, oral home Lasix and Aldactone were resumed.  Monitor renal function in 1 week.  Patient not candidate for dialysis and nephrology is recommending hospice care if renal function worsen.  Anasarca with scrotal swelling Felt to be from decompensated liver cirrhosis.  Continue Aldactone Overall anasarca has resolved, except scrotal swelling however improving Urology was consulted recommending scrotal elevation and support  treatment Follow-up with PCP in 1 week  Liver cirrhosis with ascites Decompensated, status post ultrasound-guided paracentesis 6 L removed Ammonia continues to be elevated, however patient now having bowel movement and more alert and oriented Continue lactulose 4 times a day, continue rifaximin Echo shows good LVEF, severe enlargement of left atrium and moderate enlargement of right atrium Discussed with patient and family member, that there is no other treatment options at this time other than supportive treatment.  Patient would not be candidate for liver transplant given ongoing alcohol abuse. Poor prognosis.  Alcohol abuse No signs of withdrawal  Gastric varices Continue PPI twice daily No signs of active bleed at this point  Pancytopenia From liver cirrhosis Hemoglobin remains stable Continue to monitor platelets  Acute hepatic encephalopathy - mentally at baseline Ammonia is still elevated however clinically patient with no signs of encephalopathy. Continue lactulose and rifaximin, need at least 4 bowel movement daily. Will discharge with home health PT  All other chronic medical condition were stable during the hospitalization.  Patient was seen by physical therapy, recommending home health PT On the day of the discharge the patient's vitals were stable, and no other acute medical condition were reported by patient. the patient was felt safe to be discharge to home.   Discharge Instructions  You were cared for by a hospitalist during your hospital stay. If you have any questions about your discharge medications or the care you received while you were in the hospital after you are discharged, you can call the unit and asked to speak with the hospitalist on call if the hospitalist that took care of you is not available. Once you are discharged, your primary care physician will handle any further medical issues. Please note that NO REFILLS for any discharge medications will  be authorized once you are discharged, as  it is imperative that you return to your primary care physician (or establish a relationship with a primary care physician if you do not have one) for your aftercare needs so that they can reassess your need for medications and monitor your lab values.  Discharge Instructions    Call MD for:  difficulty breathing, headache or visual disturbances   Complete by:  As directed    Call MD for:  extreme fatigue   Complete by:  As directed    Call MD for:  hives   Complete by:  As directed    Call MD for:  persistant dizziness or light-headedness   Complete by:  As directed    Call MD for:  persistant nausea and vomiting   Complete by:  As directed    Call MD for:  redness, tenderness, or signs of infection (pain, swelling, redness, odor or green/yellow discharge around incision site)   Complete by:  As directed    Call MD for:  severe uncontrolled pain   Complete by:  As directed    Call MD for:  temperature >100.4   Complete by:  As directed    Diet - low sodium heart healthy   Complete by:  As directed    Increase activity slowly   Complete by:  As directed      Allergies as of 08/28/2017   No Known Allergies     Medication List    TAKE these medications   fluticasone 50 MCG/ACT nasal spray Commonly known as:  FLONASE Place 1 spray into both nostrils daily as needed for allergies.   furosemide 40 MG tablet Commonly known as:  LASIX Take 1 tablet (40 mg total) by mouth daily.   hydroxypropyl methylcellulose / hypromellose 2.5 % ophthalmic solution Commonly known as:  ISOPTO TEARS / GONIOVISC Place 2 drops into both eyes 3 (three) times daily as needed for dry eyes.   lactulose 10 GM/15ML solution Commonly known as:  CHRONULAC Take 45 mLs (30 g total) by mouth 4 (four) times daily. What changed:    how much to take  when to take this   ondansetron 4 MG tablet Commonly known as:  ZOFRAN Take 1 tablet (4 mg total) by mouth every 6  (six) hours as needed for nausea.   oxyCODONE 5 MG immediate release tablet Commonly known as:  Oxy IR/ROXICODONE Take 1 tablet (5 mg total) by mouth 2 (two) times daily.   pantoprazole 40 MG tablet Commonly known as:  PROTONIX Take 1 tablet (40 mg total) by mouth 2 (two) times daily.   PROAIR HFA 108 (90 Base) MCG/ACT inhaler Generic drug:  albuterol Inhale 2 puffs into the lungs 4 (four) times daily as needed for shortness of breath or wheezing.   rifaximin 550 MG Tabs tablet Commonly known as:  XIFAXAN Take 1 tablet (550 mg total) by mouth 2 (two) times daily.   spironolactone 50 MG tablet Commonly known as:  ALDACTONE Take 1 tablet (50 mg total) by mouth daily.   TAB-A-VITE Tabs Take 1 tablet by mouth daily.   Vitamin D-3 1000 units Caps Take 1,000 Units by mouth daily.      Follow-up Information    Osei-Bonsu, Iona Beard, MD. Schedule an appointment as soon as possible for a visit in 1 week(s).   Specialty:  Internal Medicine Why:  Hospital follow-up Contact information: [address] [place] [address] [phone]          No Known Allergies  Consultations:  Nephrology   Palliative care   Procedures/Studies: Dg Chest 2 View  Result Date: 08/22/2017 CLINICAL DATA:  Shortness of breath EXAM: CHEST - 2 VIEW COMPARISON:  Chest radiograph 07/15/2016 FINDINGS: Low lung volumes. Stable prominent cardiac and mediastinal contours. No consolidative pulmonary opacities. Bibasilar atelectasis. No pleural effusion or pneumothorax. Osseous structures unremarkable. IMPRESSION: No acute cardiopulmonary process. Electronically Signed   By: Lovey Newcomer M.D.   On: 08/22/2017 14:36   US Scrotum  Result Date: 08/05/2017 CLINICAL DATA:  Scrotal edema EXAM: ULTRASOUND OF SCROTUM TECHNIQUE: Complete ultrasound examination of the testicles, epididymis, and other scrotal structures was performed. COMPARISON:  CT 05/20/2016, ultrasound scrotum 05/20/2016 FINDINGS: Right  testicle Measurements: 4.6 x 1.2 x 2.7 cm. No mass or microlithiasis visualized. Displaced anteriorly by large scrotal fluid collection. Left testicle Measurements: 4.4 x 2.4 x 1.8 cm. No mass or microlithiasis visualized. Right epididymis:  Normal in size and appearance. Left epididymis:  Normal in size and appearance. Hydrocele: Very large septated, mildly complex right hydrocele. Fluid in the right scrotum measures at least 20.7 cm. Varicocele:  Small bilateral varicoceles. IMPRESSION: 1. Large septated/slightly complex right scrotal fluid collection measuring at least 20.7 cm. This is increased compared to the prior ultrasound. No definite herniated bowel loops at the time of imaging. 2. Bilateral varicoceles Electronically Signed   By: Donavan Foil M.D.   On: 08/05/2017 16:16   US Renal  Result Date: 08/22/2017 CLINICAL DATA:  Acute renal failure.  Hypertension. EXAM: RENAL / URINARY TRACT ULTRASOUND COMPLETE COMPARISON:  Liver MRI, 07/21/2017 FINDINGS: Right Kidney: Length: 12 cm. Increased parenchymal echogenicity. No mass, stone or hydronephrosis. Left Kidney: Length: 11.5 cm. Increased parenchymal echogenicity. No mass, stone or hydronephrosis. Bladder: Appears normal for degree of bladder distention. Liver with coarsened echotexture, masses in surface nodularity consistent with cirrhosis than the lesions noted on the recent prior MRI. Moderate ascites. IMPRESSION: 1. No acute renal abnormality.  No hydronephrosis. 2. Increased renal parenchymal echogenicity consistent with medical renal disease. 3. No renal masses or stones. Electronically Signed   By: Lajean Manes M.D.   On: 08/22/2017 17:25   US Renal  Result Date: 08/05/2017 CLINICAL DATA:  Acute kidney injury, hypertension EXAM: RENAL / URINARY TRACT ULTRASOUND COMPLETE COMPARISON:  Abdomen complete ultrasound 07/12/2017 FINDINGS: Right Kidney: Length: 12.4 cm. Normal cortical thickness. Upper normal cortical echogenicity. No mass,  hydronephrosis or shadowing calcification. Left Kidney: Length: 11.6 cm. Normal cortical thickness. Upper normal cortical echogenicity. No mass, hydronephrosis or shadowing calcification. Bladder: Bladder unremarkable.  Prostate gland minimally prominent. Moderate ascites. IMPRESSION: No definite urinary tract abnormalities. Moderate ascites. Electronically Signed   By: Lavonia Dana M.D.   On: 08/05/2017 15:17   US Paracentesis  Result Date: 08/23/2017 INDICATION: Cirrhosis with history of hepatitis C and alcohol abuse. Ascites. Request for therapeutic paracentesis. EXAM: ULTRASOUND GUIDED PARACENTESIS MEDICATIONS: 1% Lidocaine = 10 mL COMPLICATIONS: None immediate. PROCEDURE: Informed written consent was obtained from the patient after a discussion of the risks, benefits and alternatives to treatment. A timeout was performed prior to the initiation of the procedure. Initial ultrasound scanning demonstrates a large amount of ascites within the right lower abdominal quadrant. The right lower abdomen was prepped and draped in the usual sterile fashion. 1% lidocaine with epinephrine was used for local anesthesia. Following this, a 19 gauge, 7-cm, Yueh catheter was introduced. An ultrasound image was saved for documentation purposes. The paracentesis was performed. The catheter was removed and a dressing was applied. The patient tolerated the procedure  well without immediate post procedural complication. FINDINGS: A total of approximately 9.9 liters of clear yellow fluid was removed. IMPRESSION: Successful ultrasound-guided paracentesis yielding 9.9 liters of peritoneal fluid. Read by: Gareth Eagle, PA-C Electronically Signed   By: Marybelle Killings M.D.   On: 08/23/2017 11:54   US Scrotum W/doppler  Result Date: 08/22/2017 CLINICAL DATA:  Chronic testicular swelling and pain. Cirrhosis and ascites. EXAM: SCROTAL ULTRASOUND DOPPLER ULTRASOUND OF THE TESTICLES TECHNIQUE: Complete ultrasound examination of the testicles,  epididymis, and other scrotal structures was performed. Color and spectral Doppler ultrasound were also utilized to evaluate blood flow to the testicles. COMPARISON:  08/05/2017 FINDINGS: Right testicle Measurements: 4.8 x 2.7 x 3.8 cm. No mass or microlithiasis visualized. Left testicle Measurements: 3.7 x 2.2 x 2.6 cm. No mass or microlithiasis visualized. Right epididymis:  Normal in size and appearance. Left epididymis:  Normal in size and appearance. Hydrocele: Large bilateral supra testicular fluid collections are seen which contain low-level internal echoes and several internal septations, and displace the testicles inferiorly. Varicocele:  None visualized. Pulsed Doppler interrogation of both testes demonstrates normal low resistance arterial and venous waveforms bilaterally. IMPRESSION: No evidence of testicular mass or torsion. Large bilateral supra-testicular fluid collections, most likely due to fluid-filled inguinal hernias although loculated hydroceles cannot be excluded. Electronically Signed   By: Earle Gell M.D.   On: 08/22/2017 14:13   Ir Paracentesis  Result Date: 08/07/2017 INDICATION: Cirrhosis with history of hepatitis C and alcohol abuse. Ascites. Request for diagnostic and therapeutic paracentesis. EXAM: ULTRASOUND GUIDED RIGHT LOWER QUADRANT PARACENTESIS MEDICATIONS: None. COMPLICATIONS: None immediate. PROCEDURE: Informed written consent was obtained from the patient after a discussion of the risks, benefits and alternatives to treatment. A timeout was performed prior to the initiation of the procedure. Initial ultrasound scanning demonstrates a large amount of ascites within the right lower abdominal quadrant. The right lower abdomen was prepped and draped in the usual sterile fashion. 1% lidocaine with epinephrine was used for local anesthesia. Following this, a 19 gauge, 7-cm, Yueh catheter was introduced. An ultrasound image was saved for documentation purposes. The paracentesis  was performed. The catheter was removed and a dressing was applied. The patient tolerated the procedure well without immediate post procedural complication. FINDINGS: A total of approximately 6.3 L of clear yellow fluid was removed. Samples were sent to the laboratory as requested by the clinical team. IMPRESSION: Successful ultrasound-guided paracentesis yielding 6.3 liters of peritoneal fluid. Read by: Ascencion Dike PA-C Electronically Signed   By: Corrie Mckusick D.O.   On: 08/07/2017 13:56   Ir Paracentesis  Result Date: 08/04/2017 INDICATION: Patient with alcoholic cirrhosis, hep C, enlarging liver lesion, ascites. Request is made for therapeutic paracentesis. EXAM: ULTRASOUND GUIDED THERAPEUTIC PARACENTESIS MEDICATIONS: 10 mL 2% lidocaine COMPLICATIONS: None immediate. PROCEDURE: Informed written consent was obtained from the patient after a discussion of the risks, benefits and alternatives to treatment. A timeout was performed prior to the initiation of the procedure. Initial ultrasound scanning demonstrates a large amount of ascites within the right lower abdominal quadrant. The right lower abdomen was prepped and draped in the usual sterile fashion. 2% lidocaine was used for local anesthesia. Following this, a 19 gauge, 7-cm, Yueh catheter was introduced. An ultrasound image was saved for documentation purposes. The paracentesis was performed. The catheter was removed and a dressing was applied. The patient tolerated the procedure well without immediate post procedural complication. FINDINGS: A total of approximately 5.0 liters of clear, yellow fluid was removed. IMPRESSION: Successful ultrasound-guided  therapeutic paracentesis yielding 5.0 liters of peritoneal fluid. Read by: Brynda Greathouse PA-C Electronically Signed   By: Jerilynn Mages.  Shick M.D.   On: 08/04/2017 13:37    Discharge Exam: Vitals:   08/28/17 0512 08/28/17 0929  BP: 119/89 (!) 136/92  Pulse: 68 (!) 55  Resp: 20 20  Temp: 98 F (36.7 C) 98  F (36.7 C)  SpO2: 100% 100%   Vitals:   08/27/17 0445 08/27/17 2135 08/28/17 0512 08/28/17 0929  BP: 124/70 132/88 119/89 (!) 136/92  Pulse: (!) 55 (!) 57 68 (!) 55  Resp: 18 18 20 20   Temp: 97.7 F (36.5 C) 97.8 F (36.6 C) 98 F (36.7 C) 98 F (36.7 C)  TempSrc: Oral Oral Oral Oral  SpO2: 100% 100% 100% 100%  Weight:      Height:        General: NAD HEENT: Jaundice Cardiovascular: RRR S1S2 Respiratory: CTA bilaterally Abdominal: Soft distended, non tender  Neurology: Alert and oriented x3, no focal deficit Extremities: no edema, no cyanosis   The results of significant diagnostics from this hospitalization (including imaging, microbiology, ancillary and laboratory) are listed below for reference.     Microbiology: Recent Results (from the past 240 hour(s))  Culture, Urine     Status: None   Collection Time: 08/22/17  3:11 PM  Result Value Ref Range Status   Specimen Description   Final    URINE, RANDOM Performed at [facility] [address] [place], [place] [address]    Special Requests   Final    NONE Performed at [facility], [place] [address], [place], [place] [address]    Culture   Final    NO GROWTH Performed at [facility], [place] [address] [place], [place] [address]    Report Status 08/23/2017 FINAL  Final    Labs: BNP (last 3 results) No results for input(s): BNP in the last 8760 hours. Basic Metabolic Panel: Recent Labs  Lab 08/22/17 1211  08/24/17 0512 08/25/17 0817 08/26/17 0843 08/27/17 0442 08/28/17 0427  NA 132*   < > 132* 137 138 136 137  K 3.7   < > 3.8 3.4* 3.6 3.9 3.8  CL 102   < > 105 106 107 105 105  CO2 17*   < > 19* 22 22 20* 22  GLUCOSE 121*   < > 121* 103* 128* 129* 131*  BUN 32*   < > 32* 39* 40* 39* 39*  CREATININE 3.80*   < > 3.34* 3.15* 2.80* 2.47* 2.28*  CALCIUM 8.1*   < > 7.7* 8.3* 8.2* 8.3* 8.6*  MG 1.9  --   --   --   --   --   --   PHOS 4.1  --   --   --   --   --    --    < > = values in this interval not displayed.   Liver Function Tests: Recent Labs  Lab 08/23/17 0506 08/24/17 0512 08/25/17 0817 08/27/17 0442 08/28/17 0427  AST 73* 72* 58* 62* 65*  ALT 46 45 45 46 49  ALKPHOS 152* 132* 119 122 124  BILITOT 2.7* 2.5* 2.9* 3.1* 3.5*  PROT 5.0* 4.8* 5.4* 5.8* 6.0*  ALBUMIN 2.2* 2.0* 2.1* 2.2* 2.2*   No results for input(s): LIPASE, AMYLASE in the last 168 hours. Recent Labs  Lab 08/24/17 1114 08/25/17 0817 08/26/17 0454 08/27/17 1021 08/28/17 0427  AMMONIA 57* 51* 71* 76* 119*  CBC: Recent Labs  Lab 08/22/17 1211 08/23/17 0506 08/24/17 1600 08/25/17 0513 08/27/17 0442  WBC 4.1 3.6* 6.8 5.7 4.0  NEUTROABS 2.7 2.5 5.7  --  2.6  HGB 8.4* 7.7* 9.9* 9.2* 9.7*  HCT 26.1* 23.7* 30.9* 28.6* 30.8*  MCV 85.6 83.7 83.3 82.9 83.9  PLT 72* 65* 73* 75* 78*   Cardiac Enzymes: No results for input(s): CKTOTAL, CKMB, CKMBINDEX, TROPONINI in the last 168 hours. BNP: Invalid input(s): POCBNP CBG: Recent Labs  Lab 08/23/17 0805 08/24/17 0757 08/25/17 0814 08/27/17 0838  GLUCAP 130* 124* 100* 128*   D-Dimer No results for input(s): DDIMER in the last 72 hours. Hgb A1c No results for input(s): HGBA1C in the last 72 hours. Lipid Profile No results for input(s): CHOL, HDL, LDLCALC, TRIG, CHOLHDL, LDLDIRECT in the last 72 hours. Thyroid function studies No results for input(s): TSH, T4TOTAL, T3FREE, THYROIDAB in the last 72 hours.  Invalid input(s): FREET3 Anemia work up No results for input(s): VITAMINB12, FOLATE, FERRITIN, TIBC, IRON, RETICCTPCT in the last 72 hours. Urinalysis    Component Value Date/Time   COLORURINE YELLOW 08/22/2017 1401   APPEARANCEUR HAZY (A) 08/22/2017 1401   LABSPEC 1.008 08/22/2017 1401   PHURINE 5.0 08/22/2017 1401   GLUCOSEU NEGATIVE 08/22/2017 1401   HGBUR NEGATIVE 08/22/2017 1401   BILIRUBINUR NEGATIVE 08/22/2017 1401   KETONESUR NEGATIVE 08/22/2017 1401   PROTEINUR NEGATIVE 08/22/2017 1401    UROBILINOGEN 4.0 (H) 04/26/2013 0500   NITRITE NEGATIVE 08/22/2017 1401   LEUKOCYTESUR NEGATIVE 08/22/2017 1401   Sepsis Labs Invalid input(s): PROCALCITONIN,  WBC,  LACTICIDVEN Microbiology Recent Results (from the past 240 hour(s))  Culture, Urine     Status: None   Collection Time: 08/22/17  3:11 PM  Result Value Ref Range Status   Specimen Description   Final    URINE, RANDOM Performed at [facility], [place] [address] [place], [place] [address]    Special Requests   Final    NONE Performed at [facility], [place] [address], [place], [place] [address]    Culture   Final    NO GROWTH Performed at [facility], [place] [address], [place], [place] [address]    Report Status 08/23/2017 FINAL  Final    Time coordinating discharge: 35 minutes  SIGNED:  Chipper Oman, MD  [facility] 08/28/2017, 12:02 PM  Pager please text page via  [email or web address]  Note - This record has been created using [employer]. Chart creation errors have been sought, but may not always have been located. Such creation errors do not reflect on the standard of medical care.

## 2017-08-28 NOTE — Progress Notes (Signed)
D/C instructions reviewed w/ pt's sister [name] at pt request. She verbalizes understanding and all questions answered. Pt d/c in w/c in stable condition by NT to family's car. Pt family in possession of d/c packet and all personal belongings. Per Dawn in [facility] [employer] has been called to check room for possible bedbugs and will alert Ron, Network engineer, with his findings. Will report to oncoming RN to alert family of findings as well.

## 2017-08-28 NOTE — Care Management Note (Signed)
Case Management Note  Patient Details  Name: Jose [name] MRN: [id number] Date of Birth: 22-Jul-1955  Subjective/Objective:  Dr. Vonzella Nipple HHC-sister chose [facility] rep Santiago Glad aware of d/c & [place] orders. Spoke to sister faye c#[phone] has received a call from [facility] for personal care services-additional custodial level care-she has an appt hlaredy set-I informed her to tell them patient has [facility] for [facility] voiced understanding. [name] will have own transportation for home. No further CM needs.                  Action/Plan:d/c home w/HHC.   Expected Discharge Date:  08/28/17               Expected Discharge Plan:  [place]  In-House Referral:     Discharge planning Services  CM Consult  Post Acute Care Choice:    Choice offered to:  Sibling  DME Arranged:    DME Agency:     HH Arranged:  RN, PT, Nurse's Aide, Social Work [employer] Agency:  [address]  Status of Service:  Completed, signed off  If discussed at [employer], dates discussed:    Additional Comments:  Dessa Phi, RN 08/28/2017, 12:30 PM

## 2017-09-01 ENCOUNTER — Encounter (HOSPITAL_COMMUNITY): Payer: Self-pay | Admitting: Gastroenterology

## 2017-09-10 ENCOUNTER — Inpatient Hospital Stay (HOSPITAL_COMMUNITY)
Admission: EM | Admit: 2017-09-10 | Discharge: 2017-09-12 | DRG: 432 | Disposition: A | Discharge status: Home Health | Payer: Medicaid Other | Attending: Internal Medicine | Admitting: Internal Medicine

## 2017-09-10 ENCOUNTER — Other Ambulatory Visit: Payer: Self-pay

## 2017-09-10 ENCOUNTER — Emergency Department (HOSPITAL_COMMUNITY): Payer: Medicaid Other

## 2017-09-10 ENCOUNTER — Encounter (HOSPITAL_COMMUNITY): Payer: Self-pay | Admitting: *Deleted

## 2017-09-10 DIAGNOSIS — B1920 Unspecified viral hepatitis C without hepatic coma: Secondary | ICD-10-CM | POA: Diagnosis present

## 2017-09-10 DIAGNOSIS — F1020 Alcohol dependence, uncomplicated: Secondary | ICD-10-CM | POA: Diagnosis present

## 2017-09-10 DIAGNOSIS — Z6826 Body mass index (BMI) 26.0-26.9, adult: Secondary | ICD-10-CM

## 2017-09-10 DIAGNOSIS — Z8249 Family history of ischemic heart disease and other diseases of the circulatory system: Secondary | ICD-10-CM

## 2017-09-10 DIAGNOSIS — H9190 Unspecified hearing loss, unspecified ear: Secondary | ICD-10-CM | POA: Diagnosis present

## 2017-09-10 DIAGNOSIS — Z833 Family history of diabetes mellitus: Secondary | ICD-10-CM | POA: Diagnosis not present

## 2017-09-10 DIAGNOSIS — I864 Gastric varices: Secondary | ICD-10-CM | POA: Diagnosis present

## 2017-09-10 DIAGNOSIS — E43 Unspecified severe protein-calorie malnutrition: Secondary | ICD-10-CM | POA: Diagnosis present

## 2017-09-10 DIAGNOSIS — Z7951 Long term (current) use of inhaled steroids: Secondary | ICD-10-CM | POA: Diagnosis not present

## 2017-09-10 DIAGNOSIS — N184 Chronic kidney disease, stage 4 (severe): Secondary | ICD-10-CM | POA: Diagnosis present

## 2017-09-10 DIAGNOSIS — E872 Acidosis: Secondary | ICD-10-CM | POA: Diagnosis present

## 2017-09-10 DIAGNOSIS — D61818 Other pancytopenia: Secondary | ICD-10-CM | POA: Diagnosis present

## 2017-09-10 DIAGNOSIS — K767 Hepatorenal syndrome: Secondary | ICD-10-CM | POA: Diagnosis present

## 2017-09-10 DIAGNOSIS — L209 Atopic dermatitis, unspecified: Secondary | ICD-10-CM | POA: Diagnosis present

## 2017-09-10 DIAGNOSIS — R001 Bradycardia, unspecified: Secondary | ICD-10-CM | POA: Diagnosis present

## 2017-09-10 DIAGNOSIS — Z803 Family history of malignant neoplasm of breast: Secondary | ICD-10-CM

## 2017-09-10 DIAGNOSIS — Z7189 Other specified counseling: Secondary | ICD-10-CM

## 2017-09-10 DIAGNOSIS — I129 Hypertensive chronic kidney disease with stage 1 through stage 4 chronic kidney disease, or unspecified chronic kidney disease: Secondary | ICD-10-CM | POA: Diagnosis present

## 2017-09-10 DIAGNOSIS — B182 Chronic viral hepatitis C: Secondary | ICD-10-CM | POA: Diagnosis present

## 2017-09-10 DIAGNOSIS — F1910 Other psychoactive substance abuse, uncomplicated: Secondary | ICD-10-CM | POA: Diagnosis present

## 2017-09-10 DIAGNOSIS — K7210 Chronic hepatic failure without coma: Secondary | ICD-10-CM | POA: Diagnosis present

## 2017-09-10 DIAGNOSIS — C220 Liver cell carcinoma: Secondary | ICD-10-CM | POA: Diagnosis present

## 2017-09-10 DIAGNOSIS — I10 Essential (primary) hypertension: Secondary | ICD-10-CM | POA: Diagnosis not present

## 2017-09-10 DIAGNOSIS — D696 Thrombocytopenia, unspecified: Secondary | ICD-10-CM | POA: Diagnosis present

## 2017-09-10 DIAGNOSIS — K7040 Alcoholic hepatic failure without coma: Principal | ICD-10-CM | POA: Diagnosis present

## 2017-09-10 DIAGNOSIS — F17210 Nicotine dependence, cigarettes, uncomplicated: Secondary | ICD-10-CM | POA: Diagnosis present

## 2017-09-10 DIAGNOSIS — I8510 Secondary esophageal varices without bleeding: Secondary | ICD-10-CM | POA: Diagnosis present

## 2017-09-10 DIAGNOSIS — K7290 Hepatic failure, unspecified without coma: Secondary | ICD-10-CM

## 2017-09-10 DIAGNOSIS — Z515 Encounter for palliative care: Secondary | ICD-10-CM | POA: Diagnosis not present

## 2017-09-10 DIAGNOSIS — K7031 Alcoholic cirrhosis of liver with ascites: Secondary | ICD-10-CM | POA: Diagnosis present

## 2017-09-10 DIAGNOSIS — K7682 Hepatic encephalopathy: Secondary | ICD-10-CM | POA: Diagnosis present

## 2017-09-10 DIAGNOSIS — R4182 Altered mental status, unspecified: Secondary | ICD-10-CM | POA: Diagnosis present

## 2017-09-10 HISTORY — DX: Malignant (primary) neoplasm, unspecified: C80.1

## 2017-09-10 LAB — CBC WITH DIFFERENTIAL/PLATELET
BASOS PCT: 1 %
Basophils Absolute: 0 10*3/uL (ref 0.0–0.1)
EOS ABS: 0 10*3/uL (ref 0.0–0.7)
Eosinophils Relative: 1 %
HEMATOCRIT: 33.8 % — AB (ref 39.0–52.0)
HEMOGLOBIN: 11 g/dL — AB (ref 13.0–17.0)
LYMPHS ABS: 0.4 10*3/uL — AB (ref 0.7–4.0)
LYMPHS PCT: 11 %
MCH: 26.4 pg (ref 26.0–34.0)
MCHC: 32.5 g/dL (ref 30.0–36.0)
MCV: 81.1 fL (ref 78.0–100.0)
Monocytes Absolute: 0.6 10*3/uL (ref 0.1–1.0)
Monocytes Relative: 17 %
NEUTROS ABS: 2.6 10*3/uL (ref 1.7–7.7)
Neutrophils Relative %: 70 %
Platelets: 71 10*3/uL — ABNORMAL LOW (ref 150–400)
RBC: 4.17 MIL/uL — ABNORMAL LOW (ref 4.22–5.81)
RDW: 25.2 % — AB (ref 11.5–15.5)
WBC: 3.6 10*3/uL — ABNORMAL LOW (ref 4.0–10.5)

## 2017-09-10 LAB — I-STAT CG4 LACTIC ACID, ED: LACTIC ACID, VENOUS: 2.39 mmol/L — AB (ref 0.5–1.9)

## 2017-09-10 LAB — LIPASE, BLOOD: Lipase: 62 U/L — ABNORMAL HIGH (ref 11–51)

## 2017-09-10 LAB — COMPREHENSIVE METABOLIC PANEL
ALK PHOS: 185 U/L — AB (ref 38–126)
ALT: 63 U/L (ref 17–63)
ANION GAP: 14 (ref 5–15)
AST: 102 U/L — ABNORMAL HIGH (ref 15–41)
Albumin: 2.1 g/dL — ABNORMAL LOW (ref 3.5–5.0)
BILIRUBIN TOTAL: 5.1 mg/dL — AB (ref 0.3–1.2)
BUN: 25 mg/dL — ABNORMAL HIGH (ref 6–20)
CALCIUM: 8 mg/dL — AB (ref 8.9–10.3)
CO2: 18 mmol/L — ABNORMAL LOW (ref 22–32)
Chloride: 101 mmol/L (ref 101–111)
Creatinine, Ser: 2.36 mg/dL — ABNORMAL HIGH (ref 0.61–1.24)
GFR calc non Af Amer: 28 mL/min — ABNORMAL LOW (ref >60)
GFR, EST AFRICAN AMERICAN: 33 mL/min — AB (ref >60)
Glucose, Bld: 102 mg/dL — ABNORMAL HIGH (ref 65–99)
Potassium: 4.4 mmol/L (ref 3.5–5.1)
SODIUM: 133 mmol/L — AB (ref 135–145)
TOTAL PROTEIN: 5.9 g/dL — AB (ref 6.5–8.1)

## 2017-09-10 LAB — AMMONIA: AMMONIA: 124 umol/L — AB (ref 9–35)

## 2017-09-10 MED ORDER — VITAMIN D 1000 UNITS PO TABS
1000.0000 [IU] | ORAL_TABLET | Freq: Every day | ORAL | Status: DC
  Administered 2017-09-11 – 2017-09-12 (×2): 1000 [IU] via ORAL
  Filled 2017-09-10 (×3): qty 1

## 2017-09-10 MED ORDER — KETOROLAC TROMETHAMINE 15 MG/ML IJ SOLN: 15.0000 mg | Freq: Four times a day (QID) | INTRAMUSCULAR | Status: DC | PRN

## 2017-09-10 MED ORDER — LACTULOSE 10 GM/15ML PO SOLN
30.0000 g | Freq: Four times a day (QID) | ORAL | Status: DC
  Filled 2017-09-10 (×2): qty 45

## 2017-09-10 MED ORDER — PANTOPRAZOLE SODIUM 40 MG PO TBEC
40.0000 mg | DELAYED_RELEASE_TABLET | Freq: Two times a day (BID) | ORAL | Status: DC
  Administered 2017-09-11 – 2017-09-12 (×3): 40 mg via ORAL
  Filled 2017-09-10 (×3): qty 1

## 2017-09-10 MED ORDER — LACTULOSE ENEMA
300.0000 mL | Freq: Three times a day (TID) | ORAL | Status: DC
  Administered 2017-09-10 – 2017-09-11 (×2): 300 mL via RECTAL
  Filled 2017-09-10 (×4): qty 300

## 2017-09-10 MED ORDER — ALBUTEROL SULFATE (2.5 MG/3ML) 0.083% IN NEBU: 3.0000 mL | INHALATION_SOLUTION | Freq: Four times a day (QID) | RESPIRATORY_TRACT | Status: DC | PRN

## 2017-09-10 MED ORDER — TAB-A-VITE PO TABS: 1.0000 | ORAL_TABLET | Freq: Every day | ORAL | Status: DC

## 2017-09-10 MED ORDER — TRAMADOL HCL 50 MG PO TABS: 50.0000 mg | ORAL_TABLET | Freq: Four times a day (QID) | ORAL | Status: DC | PRN

## 2017-09-10 MED ORDER — HYPROMELLOSE (GONIOSCOPIC) 2.5 % OP SOLN
2.0000 [drp] | Freq: Three times a day (TID) | OPHTHALMIC | Status: DC | PRN
Start: 2017-09-10 — End: 2017-09-12

## 2017-09-10 MED ORDER — LACTULOSE ENEMA
300.0000 mL | Freq: Once | ORAL | Status: AC
  Administered 2017-09-10: 300 mL via RECTAL
  Filled 2017-09-10: qty 300

## 2017-09-10 MED ORDER — SODIUM CHLORIDE 0.9 % IV BOLUS
1000.0000 mL | Freq: Once | INTRAVENOUS | Status: AC
  Administered 2017-09-10: 1000 mL via INTRAVENOUS

## 2017-09-10 MED ORDER — SODIUM CHLORIDE 0.9% FLUSH
3.0000 mL | Freq: Two times a day (BID) | INTRAVENOUS | Status: DC
  Administered 2017-09-10 – 2017-09-12 (×4): 3 mL via INTRAVENOUS

## 2017-09-10 MED ORDER — ADULT MULTIVITAMIN W/MINERALS CH
1.0000 | ORAL_TABLET | Freq: Every day | ORAL | Status: DC
  Administered 2017-09-11 – 2017-09-12 (×2): 1 via ORAL
  Filled 2017-09-10 (×2): qty 1

## 2017-09-10 MED ORDER — ONDANSETRON HCL 4 MG PO TABS: 4.0000 mg | ORAL_TABLET | Freq: Four times a day (QID) | ORAL | Status: DC | PRN

## 2017-09-10 MED ORDER — SPIRONOLACTONE 50 MG PO TABS
50.0000 mg | ORAL_TABLET | Freq: Every day | ORAL | Status: DC
  Administered 2017-09-11 – 2017-09-12 (×2): 50 mg via ORAL
  Filled 2017-09-10: qty 1
  Filled 2017-09-10 (×2): qty 2
  Filled 2017-09-10 (×2): qty 1

## 2017-09-10 MED ORDER — SODIUM CHLORIDE 0.9 % IV SOLN
INTRAVENOUS | Status: DC
  Administered 2017-09-10: 21:00:00 via INTRAVENOUS

## 2017-09-10 MED ORDER — ENOXAPARIN SODIUM 30 MG/0.3ML ~~LOC~~ SOLN
30.0000 mg | SUBCUTANEOUS | Status: DC
  Administered 2017-09-10: 30 mg via SUBCUTANEOUS
  Filled 2017-09-10 (×2): qty 0.3

## 2017-09-10 MED ORDER — FUROSEMIDE 40 MG PO TABS
40.0000 mg | ORAL_TABLET | Freq: Every day | ORAL | Status: DC
  Administered 2017-09-11 – 2017-09-12 (×2): 40 mg via ORAL
  Filled 2017-09-10 (×2): qty 1

## 2017-09-10 MED ORDER — ONDANSETRON HCL 4 MG/2ML IJ SOLN: 4.0000 mg | Freq: Four times a day (QID) | INTRAMUSCULAR | Status: DC | PRN

## 2017-09-10 MED ORDER — RIFAXIMIN 550 MG PO TABS
550.0000 mg | ORAL_TABLET | Freq: Two times a day (BID) | ORAL | Status: DC
  Administered 2017-09-11 – 2017-09-12 (×3): 550 mg via ORAL
  Filled 2017-09-10 (×6): qty 1

## 2017-09-10 NOTE — H&P (Signed)
History and Physical    Jose [name]:[id number] DOB: 1955/11/26 DOA: 09/10/2017  PCP: [name], MD   Patient coming from: Home.  I have personally briefly reviewed patient's old medical records in [place]  Chief Complaint: Unresponsive  HPI: Jose [name] is a 62 y.o. male with medical history significant for alcoholic cirrhosis of the liver, hepatitis C, esophageal varices, hypertension, QT prolongation, and pancytopenia, previous episodes of hospitalization for hepatic encephalopathy seen normal yesterday.  Per report from his sister he took his lactulose yesterday and then she left.  This morning she went to check on him and he was confused and altered.  On arrival of EMS to his home he is responsive to painful stimuli only.  He has been nonverbal throughout his stay here in the emergency department.  His vital signs have been stable.  He was given lactulose via rectal tube. He has had profound bradycardia in the emergency department.  When he is stimulated his heart rate comes up to the 50s however mostly his rate has been in the 40s to the 30s when resting and undisturbed.  To the best of his sister's knowledge he does not have a prior history of this.  She is unable to provide any history due to his current medical condition.  Review of Systems: Patient unable due to hepatic encephalopathy  Past Medical History:  Diagnosis Date  . Alcohol dependence ([place])   . Ascites   . Cancer ([place])   . Cirrhosis ([place])   . Depression   . Esophageal varices ([place]) 07/2016   per CT  . 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 Hepatitis C    Hepatitis C  . HOH (hard of hearing)   . Hypertension   . Inguinal hernia    right  . QT prolongation   . Shortness of breath    with exertion  . Thrombocytopenia ([place])     Past Surgical History:  Procedure Laterality Date  . CIRCUMCISION    . COLONOSCOPY  march 2015   Dr. Renee Harder: nodular mucosa at appendiceal orifice, tubular  adenoma, extremely poor prep  . COLONOSCOPY    . ESOPHAGOGASTRODUODENOSCOPY N/A 08/05/2017   Procedure: ESOPHAGOGASTRODUODENOSCOPY (EGD);  Surgeon: Jackquline Denmark, MD;  Location[facility] ENDOSCOPY;  Service: Endoscopy;  Laterality: N/A;  . ESOPHAGOGASTRODUODENOSCOPY (EGD) WITH PROPOFOL N/A 07/13/2017   Procedure: ESOPHAGOGASTRODUODENOSCOPY (EGD) WITH PROPOFOL;  Surgeon: Doran Stabler, MD;  Location: [facility] ENDOSCOPY;  Service: Gastroenterology;  Laterality: N/A;  . IR PARACENTESIS  08/04/2017  . IR PARACENTESIS  08/07/2017  . TOOTH EXTRACTION N/A 05/15/2017   Procedure: DENTAL RESTORATION and multiple teeth EXTRACTIONS;  Surgeon: Diona Browner, DDS;  Location: [facility];  Service: Oral Surgery;  Laterality: N/A;    Social History   Social History Narrative   ** Merged History Encounter **         reports that he has been smoking cigarettes.  He has a 5.64 pack-year smoking history. He has never used smokeless tobacco. He reports that he drinks alcohol. He reports that he does not use drugs.  No Known Allergies  Family History  Problem Relation Age of Onset  . Breast cancer Other   . Diabetes Other   . Hypertension Other   . Breast cancer Mother   . Hypertension Mother   . Diabetes Father   . Hypertension Sister   . Heart disease Sister   . Hypertension Paternal Grandmother   . Colon cancer  Neg Hx      Prior to Admission medications   Medication Sig Start Date End Date Taking? Authorizing Provider  Cholecalciferol (VITAMIN D-3) 1000 units CAPS Take 1,000 Units by mouth daily.   Yes [provider]  furosemide (LASIX) 40 MG tablet Take 1 tablet (40 mg total) by mouth daily. 08/09/17  Yes Georgette Shell, MD  hydroxypropyl methylcellulose / hypromellose (ISOPTO TEARS / GONIOVISC) 2.5 % ophthalmic solution Place 2 drops into both eyes 3 (three) times daily as needed for dry eyes.   Yes [provider]  lactulose (CHRONULAC) 10 GM/15ML solution Take 45 mLs (30 g total) by  mouth 4 (four) times daily. 08/28/17  Yes Patrecia Pour, Christean Grief, MD  Multiple Vitamin (TAB-A-VITE) TABS Take 1 tablet by mouth daily.   Yes [provider]  pantoprazole (PROTONIX) 40 MG tablet Take 1 tablet (40 mg total) by mouth 2 (two) times daily. 08/08/17  Yes Georgette Shell, MD  PROAIR HFA 108 [phone] Base) MCG/ACT inhaler Inhale 2 puffs into the lungs 4 (four) times daily as needed for shortness of breath or wheezing. 04/02/17  Yes [provider]  rifaximin (XIFAXAN) 550 MG TABS tablet Take 1 tablet (550 mg total) by mouth 2 (two) times daily. 11/14/16  Yes Danis, Kirke Corin, MD  spironolactone (ALDACTONE) 50 MG tablet Take 1 tablet (50 mg total) by mouth daily. 08/09/17  Yes Georgette Shell, MD  fluticasone [facility]) 50 MCG/ACT nasal spray Place 1 spray into both nostrils daily as needed for allergies.     [provider]  ondansetron (ZOFRAN) 4 MG tablet Take 1 tablet (4 mg total) by mouth every 6 (six) hours as needed for nausea. 08/08/17   Georgette Shell, MD  oxyCODONE (OXY IR/ROXICODONE) 5 MG immediate release tablet Take 1 tablet (5 mg total) by mouth 2 (two) times daily. Patient not taking: Reported on 09/10/2017 08/08/17   Georgette Shell, MD    Physical Exam:  Constitutional: NAD, calm, comfortable Vitals:   09/10/17 1500 09/10/17 1630 09/10/17 1700 09/10/17 1730  BP: (!) 151/80 139/70 131/63 124/79  Pulse: (!) 44 (!) 43 (!) 46 (!) 47  Resp: 12  11 14   Temp:      TempSrc:      SpO2: 98% 99% 98% 100%  Height:       Eyes: PERRL, lids and conjunctivae normal ENMT: Mucous membranes are dry. Posterior pharynx clear of any exudate or lesions.Normal dentition.  Neck: normal, supple, no masses, no thyromegaly Respiratory: clear to auscultation bilaterally, no wheezing, no crackles.  No accessory muscle use.  Patient was able to take deep breaths when requested Cardiovascular: Regular rate and rhythm, no murmurs / rubs / gallops. No extremity  edema. 2+ pedal pulses. No carotid bruits.  Abdomen: no tenderness, no masses palpated. No hepatosplenomegaly. Bowel sounds positive.  Musculoskeletal: no clubbing / cyanosis. No joint deformity upper and lower extremities. Good ROM, no contractures. Normal muscle tone.  Skin: no rashes, lesions, ulcers. No induration Neurologic: Does not follow commands for neurological examination.   Psychiatric: Nonverbal and not answering questions    Labs on Admission: I have personally reviewed following labs and imaging studies  CBC: Recent Labs  Lab 09/10/17 1133  WBC 3.6*  NEUTROABS 2.6  HGB 11.0*  HCT 33.8*  MCV 81.1  PLT 71*   Basic Metabolic Panel: Recent Labs  Lab 09/10/17 1133  NA 133*  K 4.4  CL 101  CO2 18*  GLUCOSE 102*  BUN 25*  CREATININE 2.36*  CALCIUM 8.0*   GFR: CrCl cannot be calculated (Unknown ideal weight.). Liver Function Tests: Recent Labs  Lab 09/10/17 1133  AST 102*  ALT 63  ALKPHOS 185*  BILITOT 5.1*  PROT 5.9*  ALBUMIN 2.1*   Recent Labs  Lab 09/10/17 1133  LIPASE 62*   Recent Labs  Lab 09/10/17 1044  AMMONIA 124*   Coagulation Profile: No results for input(s): INR, PROTIME in the last 168 hours. Cardiac Enzymes: No results for input(s): CKTOTAL, CKMB, CKMBINDEX, TROPONINI in the last 168 hours. BNP (last 3 results) No results for input(s): PROBNP in the last 8760 hours. HbA1C: No results for input(s): HGBA1C in the last 72 hours. CBG: No results for input(s): GLUCAP in the last 168 hours. Lipid Profile: No results for input(s): CHOL, HDL, LDLCALC, TRIG, CHOLHDL, LDLDIRECT in the last 72 hours. Thyroid Function Tests: No results for input(s): TSH, T4TOTAL, FREET4, T3FREE, THYROIDAB in the last 72 hours. Anemia Panel: No results for input(s): VITAMINB12, FOLATE, FERRITIN, TIBC, IRON, RETICCTPCT in the last 72 hours. Urine analysis:    Component Value Date/Time   COLORURINE YELLOW 08/22/2017 1401   APPEARANCEUR HAZY (A)  08/22/2017 1401   LABSPEC 1.008 08/22/2017 1401   PHURINE 5.0 08/22/2017 1401   GLUCOSEU NEGATIVE 08/22/2017 1401   HGBUR NEGATIVE 08/22/2017 1401   BILIRUBINUR NEGATIVE 08/22/2017 1401   KETONESUR NEGATIVE 08/22/2017 1401   PROTEINUR NEGATIVE 08/22/2017 1401   UROBILINOGEN 4.0 (H) 04/26/2013 0500   NITRITE NEGATIVE 08/22/2017 1401   LEUKOCYTESUR NEGATIVE 08/22/2017 1401    Radiological Exams on Admission: Ct Head Wo Contrast  Result Date: 09/10/2017 CLINICAL DATA:  62 year old male found unresponsive. EXAM: CT HEAD WITHOUT CONTRAST TECHNIQUE: Contiguous axial images were obtained from the base of the skull through the vertex without intravenous contrast. COMPARISON:  Head CT 07/12/2017 and earlier. FINDINGS: Brain: Patchy and confluent bilateral cerebral white matter hypodensity appears stable since March. Cerebral volume remains within normal limits. No midline shift, ventriculomegaly, mass effect, evidence of mass lesion, intracranial hemorrhage or evidence of cortically based acute infarction. No cortical encephalomalacia identified. Vascular: Calcified atherosclerosis at the skull base. No suspicious intracranial vascular hyperdensity. Skull: No acute osseous abnormality identified. Chronic bilateral lamina papyracea fractures. Sinuses/Orbits: Visualized paranasal sinuses and mastoids are stable and well pneumatized. Chronic bilateral lamina papyracea fractures. Other: Visualized orbits and scalp soft tissues are within normal limits. IMPRESSION: No acute intracranial abnormality. Stable non contrast CT appearance of the brain with moderately advanced chronic cerebral white matter disease. Electronically Signed   By: Genevie Ann M.D.   On: 09/10/2017 12:42   Dg Chest Portable 1 View  Result Date: 09/10/2017 CLINICAL DATA:  Unresponsive, history of hepatitis-C with cirrhosis and liver cancer, had lactulose last night, smoker EXAM: PORTABLE CHEST 1 VIEW COMPARISON:  Portable exam 1103 hours  compared to 08/22/2017 FINDINGS: Enlargement of cardiac silhouette likely accentuated by hypoinflation. Atherosclerotic calcification aorta. Low lung volumes with bibasilar atelectasis. Upper lungs clear. No pleural effusion or pneumothorax. Bones demineralized. IMPRESSION: Low lung volumes with bibasilar atelectasis. Electronically Signed   By: Lavonia Dana M.D.   On: 09/10/2017 11:15    EKG: Independently reviewed.  Sinus bradycardia at 30 to 40 bpm.  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  Hepatic encephalopathy: Patient minimally to unresponsive.  Lactulose has been started his ammonia level is elevated at 124.  Continue lactulose rectally until patient able to take it orally.  Check a.m. ammonia level  2.  Hepatorenal syndrome: Patient with elevated creatinine which has been elevated and most recent blood work going back the past month.  Is quite concerning his hepatorenal syndrome has a very high mortality.  We will continue to try to bring down his lactulose.  Hydrate the patient with gentle normal saline at 75 mL/h.  3.  Bradycardia: I am concerned as this may be a function of his hepatorenal syndrome.  Patient will be admitted into the stepdown unit followed closely if hydration does not improve his current situation may need to consider cardiology consultation.  4..  Atopic dermatitis: Noted.  Unable to find treatment and old records would recommend further research.  5.  Essential hypertension: Blood pressures noted seem to be  improving.  Continue to monitor.  On initial presentation blood pressures were elevated.  6.  Protein calorie malnutrition severe: Patient with very poor oral intake.  Also his liver failure prevents him from being able to digest and absorb food normally.  Will consider nutrition consultation.  7.  Chronic hepatitis C: Unsure as to whether patient has been treated for this.  At this point given his severe cirrhosis it would be more appropriate to discuss end-of-life care.  8.  End-stage liver disease: With hepatorenal syndrome this as a layer of complexity and increases his mortality significantly.  Will monitor closely.  See below.  9.  Pancytopenia, acquired: Related to cirrhosis and end-stage liver disease.  10.  Chronic kidney disease stage IV: We will hydrate patient see if renal function improves.  At this point he has been at this creatinine level 4 weeks.  This is a very ominous sign.  11.  Alcoholism: Unknown as to whether the patient has been drinking recently.  Alcohol level at this point would not be useful as he has been in the hospital for many hours.  12.  Goals of care/counseling discussion: I discussed with the sister patient's goals of care and plans she stated that a year ago he was a DNR but then he reversed it and currently he is a full code.  Patient is unable to participate in the discussion at this point but would recommend consideration of palliative care consult.  13.  Polysubstance abuse: Unsure what the inciting event for this episode of hepatic encephalopathy is.  Will obtain urine drug screen.       DVT prophylaxis: Lovenox Code Status: Full code Family Communication: Spoke with patient's sister who was present at the bedside Disposition Plan: Inpatient likely 7-day length of stay hopefully back home Consults called: None Admission status: Inpatient   Lady Deutscher MD [facility] Pager [phone]  If 7PM-7AM, please contact  night-coverage [email or web address] Password [facility]  09/10/2017, 6:21 PM

## 2017-09-10 NOTE — ED Provider Notes (Signed)
[facility] EMERGENCY DEPARTMENT Provider Note   CSN: [id number] Arrival date & time: 09/10/17  1045     History   Chief Complaint Chief Complaint  Patient presents with  . Altered Mental Status    HPI Jose [name] is a 62 y.o. male.  HPI   62 year old male with past medical history as below including severe cirrhosis with history of recurrent hyperammonemia here with altered mental status.  History provided by EMS.  Patient was last seen normal yesterday.  Per report from his sister, he was given his dose of lactulose and she left.  He was found this morning, by sister, confused and altered.  On EMS arrival, he is responsive to painful stimuli only.  Is been nonverbal.  His vital signs been stable.  Level 5 caveat invoked as remainder of history, ROS, and physical exam limited due to patient's AMS.   Past Medical History:  Diagnosis Date  . Alcohol dependence ([place])   . Ascites   . Cancer ([place])   . Cirrhosis ([place])   . Depression   . 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 Hepatitis C    Hepatitis C  . HOH (hard of hearing)   . Hypertension   . Inguinal hernia    right  . QT prolongation   . Shortness of breath    with exertion  . Thrombocytopenia [facility])     Patient Active Problem List   Diagnosis Date Noted  . Hepatic encephalopathy ([place]) 09/10/2017  . CKD (chronic kidney disease), stage IV ([place]) 09/10/2017  . Ascites due to alcoholic cirrhosis ([place])   . AKI (acute kidney injury) ([place]) 08/22/2017  . Scrotal edema 08/22/2017  . Palliative care by specialist   . Decompensation of cirrhosis of liver ([place])   . Anemia 08/05/2017  . Polysubstance abuse ([place]) 08/04/2017  . Upper GI bleed 08/04/2017  . Acute anemia 07/13/2017  . Symptomatic anemia 07/12/2017  . Hyponatremia 07/12/2017  . Smoker 01/08/2017  . Pancytopenia, acquired ([place]) 01/08/2017  . Pain in right leg 10/06/2016  . End stage liver  disease ([place]) 09/10/2016  . Palliative care encounter   . Goals of care, counseling/discussion   . Liver mass, right lobe   . Acute kidney injury ([place]) 08/14/2016  . 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 Hepatitis C, chronic ([place]) 01/04/2016  . Right inguinal hernia 09/12/2015  . Essential hypertension 09/12/2015  . Gastroesophageal reflux disease without esophagitis 09/12/2015  . Protein-calorie malnutrition, severe ([place]) 09/12/2015  . Anemia of chronic disease 09/12/2015  . Arthritis of left knee 09/12/2015  . Elevated LFTs   . Alcoholic cirrhosis of liver with ascites ([place]) 07/25/2015  . Macrocytic anemia 07/23/2015  . Alcoholism ([place]) 07/12/2015  . 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 Encephalopathy, hepatic ([place]) 06/26/2015  . Adenomatous polyps 10/18/2013  . Unspecified vitamin D deficiency 07/13/2013  . Atopic dermatitis 04/28/2013  . Thrombocytopenia ([place]) 04/28/2013  . Anasarca 04/26/2013    Past Surgical History:  Procedure Laterality Date  . CIRCUMCISION    . COLONOSCOPY  march 2015   Dr. Renee Harder: nodular mucosa at appendiceal orifice, tubular adenoma, extremely poor prep  . COLONOSCOPY    . ESOPHAGOGASTRODUODENOSCOPY N/A 08/05/2017   Procedure: ESOPHAGOGASTRODUODENOSCOPY (EGD);  Surgeon: Jackquline Denmark, MD;  Location: [facility] ENDOSCOPY;  Service: Endoscopy;  Laterality: N/A;  . ESOPHAGOGASTRODUODENOSCOPY (EGD) WITH PROPOFOL N/A 07/13/2017   Procedure: ESOPHAGOGASTRODUODENOSCOPY (EGD) WITH PROPOFOL;  Surgeon: Loletha Carrow,  Kirke Corin, MD;  Location: [facility] ENDOSCOPY;  Service: Gastroenterology;  Laterality: N/A;  . IR PARACENTESIS  08/04/2017  . IR PARACENTESIS  08/07/2017  . TOOTH EXTRACTION N/A 05/15/2017   Procedure: DENTAL RESTORATION and multiple teeth EXTRACTIONS;  Surgeon: Diona Browner, DDS;  Location: [facility];  Service: Oral Surgery;  Laterality: N/A;        Home Medications    Prior to Admission medications   Medication Sig Start Date End Date Taking? Authorizing Provider    Cholecalciferol (VITAMIN D-3) 1000 units CAPS Take 1,000 Units by mouth daily.   Yes [provider]  furosemide (LASIX) 40 MG tablet Take 1 tablet (40 mg total) by mouth daily. 08/09/17  Yes Georgette Shell, MD  hydroxypropyl methylcellulose / hypromellose (ISOPTO TEARS / GONIOVISC) 2.5 % ophthalmic solution Place 2 drops into both eyes 3 (three) times daily as needed for dry eyes.   Yes [provider]  lactulose (CHRONULAC) 10 GM/15ML solution Take 45 mLs (30 g total) by mouth 4 (four) times daily. 08/28/17  Yes Patrecia Pour, Christean Grief, MD  Multiple Vitamin (TAB-A-VITE) TABS Take 1 tablet by mouth daily.   Yes [provider]  pantoprazole (PROTONIX) 40 MG tablet Take 1 tablet (40 mg total) by mouth 2 (two) times daily. 08/08/17  Yes Georgette Shell, MD  PROAIR HFA 108 [phone] Base) MCG/ACT inhaler Inhale 2 puffs into the lungs 4 (four) times daily as needed for shortness of breath or wheezing. 04/02/17  Yes [provider]  rifaximin (XIFAXAN) 550 MG TABS tablet Take 1 tablet (550 mg total) by mouth 2 (two) times daily. 11/14/16  Yes Danis, Kirke Corin, MD  spironolactone (ALDACTONE) 50 MG tablet Take 1 tablet (50 mg total) by mouth daily. 08/09/17  Yes Georgette Shell, MD  fluticasone [facility]) 50 MCG/ACT nasal spray Place 1 spray into both nostrils daily as needed for allergies.     [provider]  ondansetron (ZOFRAN) 4 MG tablet Take 1 tablet (4 mg total) by mouth every 6 (six) hours as needed for nausea. 08/08/17   Georgette Shell, MD  oxyCODONE (OXY IR/ROXICODONE) 5 MG immediate release tablet Take 1 tablet (5 mg total) by mouth 2 (two) times daily. Patient not taking: Reported on 09/10/2017 08/08/17   Georgette Shell, MD    Family History Family History  Problem Relation Age of Onset  . Breast cancer Other   . Diabetes Other   . Hypertension Other   . Breast cancer Mother   . Hypertension Mother   . Diabetes Father   .  Hypertension Sister   . Heart disease Sister   . Hypertension Paternal Grandmother   . Colon cancer Neg Hx     Social History Social History   Tobacco Use  . Smoking status: Current Some Day Smoker    Packs/day: 0.12    Years: 47.00    Pack years: 5.64    Types: Cigarettes  . Smokeless tobacco: Never Used  Substance Use Topics  . Alcohol use: Yes    Comment: denies any in the last few weeks  . Drug use: No    Types: Marijuana, Cocaine    Comment: Last time for cocaine a few weeks ago ;Marijuana- intermittent     Allergies   Patient has no known allergies.   Review of Systems Review of Systems  Unable to perform ROS: Mental status change  Constitutional: Positive for fatigue.  Neurological: Positive for weakness.  Psychiatric/Behavioral: Positive for  confusion.     Physical Exam Updated Vital Signs BP (!) 146/78   Pulse (!) 38   Temp (!) 96.8 F (36 C) (Rectal)   Resp 10   Ht 5\' 6"  (1.676 m)   SpO2 100%   BMI 26.15 kg/m   Physical Exam  Constitutional: He appears well-developed and well-nourished. He appears distressed.  Chronically ill-appearing, distressed  HENT:  Head: Normocephalic and atraumatic.  Eyes: Conjunctivae are normal.  Neck: Neck supple.  Cardiovascular: Normal rate, regular rhythm and normal heart sounds. Exam reveals no friction rub.  No murmur heard. Pulmonary/Chest: Effort normal and breath sounds normal. No respiratory distress. He has no wheezes. He has no rales.  Abdominal: Soft. He exhibits distension.  Fluid wave, not apparently tender  Genitourinary:  Genitourinary Comments: Marketed scrotal and penile edema  Musculoskeletal: He exhibits no edema.  Neurological: He is alert. He exhibits normal muscle tone.  Alert, but responds only to painful stimuli.  Does not follow commands.  Face appears symmetric.  Noted to move all extremities.  Skin: Skin is warm. Capillary refill takes less than 2 seconds.  Psychiatric: He has a  normal mood and affect.  Nursing note and vitals reviewed.    ED Treatments / Results  Labs (all labs ordered are listed, but only abnormal results are displayed) Labs Reviewed  CBC WITH DIFFERENTIAL/PLATELET - Abnormal; Notable for the following components:      Result Value   WBC 3.6 (*)    RBC 4.17 (*)    Hemoglobin 11.0 (*)    HCT 33.8 (*)    RDW 25.2 (*)    Platelets 71 (*)    Lymphs Abs 0.4 (*)    All other components within normal limits  COMPREHENSIVE METABOLIC PANEL - Abnormal; Notable for the following components:   Sodium 133 (*)    CO2 18 (*)    Glucose, Bld 102 (*)    BUN 25 (*)    Creatinine, Ser 2.36 (*)    Calcium 8.0 (*)    Total Protein 5.9 (*)    Albumin 2.1 (*)    AST 102 (*)    Alkaline Phosphatase 185 (*)    Total Bilirubin 5.1 (*)    GFR calc non Af Amer 28 (*)    GFR calc Af Amer 33 (*)    All other components within normal limits  LIPASE, BLOOD - Abnormal; Notable for the following components:   Lipase 62 (*)    All other components within normal limits  AMMONIA - Abnormal; Notable for the following components:   Ammonia 124 (*)    All other components within normal limits  I-STAT CG4 LACTIC ACID, ED - Abnormal; Notable for the following components:   Lactic Acid, Venous 2.39 (*)    All other components within normal limits  CULTURE, BLOOD (ROUTINE X 2)  CULTURE, BLOOD (ROUTINE X 2)  URINALYSIS, ROUTINE W REFLEX MICROSCOPIC  PROTIME-INR  APTT  COMPREHENSIVE METABOLIC PANEL  I-STAT CG4 LACTIC ACID, ED  I-STAT TROPONIN, ED    EKG EKG Interpretation  Date/Time:  Thursday Sep 10 2017 15:22:05 EDT Ventricular Rate:  46 PR Interval:    QRS Duration: 105 QT Interval:  598 QTC Calculation: 524 R Axis:   85 Text Interpretation:  Since bradycardia PVC Nonspecific T abnrm, anterolateral leads Since last EKG, rate significantly decreased Confirmed by Duffy Bruce [phone]) on 09/10/2017 4:28:51 PM   Radiology Ct Head Wo  Contrast  Result Date: 09/10/2017 CLINICAL DATA:  62 year old  male found unresponsive. EXAM: CT HEAD WITHOUT CONTRAST TECHNIQUE: Contiguous axial images were obtained from the base of the skull through the vertex without intravenous contrast. COMPARISON:  Head CT 07/12/2017 and earlier. FINDINGS: Brain: Patchy and confluent bilateral cerebral white matter hypodensity appears stable since March. Cerebral volume remains within normal limits. No midline shift, ventriculomegaly, mass effect, evidence of mass lesion, intracranial hemorrhage or evidence of cortically based acute infarction. No cortical encephalomalacia identified. Vascular: Calcified atherosclerosis at the skull base. No suspicious intracranial vascular hyperdensity. Skull: No acute osseous abnormality identified. Chronic bilateral lamina papyracea fractures. Sinuses/Orbits: Visualized paranasal sinuses and mastoids are stable and well pneumatized. Chronic bilateral lamina papyracea fractures. Other: Visualized orbits and scalp soft tissues are within normal limits. IMPRESSION: No acute intracranial abnormality. Stable non contrast CT appearance of the brain with moderately advanced chronic cerebral white matter disease. Electronically Signed   By: Genevie Ann M.D.   On: 09/10/2017 12:42   Dg Chest Portable 1 View  Result Date: 09/10/2017 CLINICAL DATA:  Unresponsive, history of hepatitis-C with cirrhosis and liver cancer, had lactulose last night, smoker EXAM: PORTABLE CHEST 1 VIEW COMPARISON:  Portable exam 1103 hours compared to 08/22/2017 FINDINGS: Enlargement of cardiac silhouette likely accentuated by hypoinflation. Atherosclerotic calcification aorta. Low lung volumes with bibasilar atelectasis. Upper lungs clear. No pleural effusion or pneumothorax. Bones demineralized. IMPRESSION: Low lung volumes with bibasilar atelectasis. Electronically Signed   By: Lavonia Dana M.D.   On: 09/10/2017 11:15    Procedures .Critical Care Performed by:  Duffy Bruce, MD Authorized by: Duffy Bruce, MD   Critical care provider statement:    Critical care time (minutes):  45   Critical care time was exclusive of:  Separately billable procedures and treating other patients and teaching time   Critical care was necessary to treat or prevent imminent or life-threatening deterioration of the following conditions:  Circulatory failure and metabolic crisis   Critical care was time spent personally by me on the following activities:  Development of treatment plan with patient or surrogate, discussions with consultants, evaluation of patient's response to treatment, examination of patient, obtaining history from patient or surrogate, ordering and performing treatments and interventions, ordering and review of laboratory studies, ordering and review of radiographic studies, pulse oximetry, re-evaluation of patient's condition and review of old charts   I assumed direction of critical care for this patient from another provider in my specialty: no     (including critical care time)  Medications Ordered in ED Medications  furosemide (LASIX) tablet 40 mg (0 mg Oral Hold 09/10/17 1712)  lactulose (CHRONULAC) 10 GM/15ML solution 30 g (has no administration in time range)  pantoprazole (PROTONIX) EC tablet 40 mg (0 mg Oral Hold 09/10/17 1714)  rifaximin (XIFAXAN) tablet 550 mg (0 mg Oral Hold 09/10/17 1712)  spironolactone (ALDACTONE) tablet 50 mg (0 mg Oral Hold 09/10/17 1713)  Vitamin D-3 CAPS 1,000 Units (0 Units Oral Hold 09/10/17 1713)  hydroxypropyl methylcellulose / hypromellose (ISOPTO TEARS / GONIOVISC) 2.5 % ophthalmic solution 2 drop (has no administration in time range)  albuterol (PROVENTIL) (2.5 MG/3ML) 0.083% nebulizer solution 3 mL (has no administration in time range)  enoxaparin (LOVENOX) injection 30 mg (has no administration in time range)  sodium chloride flush (NS) 0.9 % injection 3 mL (has no administration in time range)  0.9 %   sodium chloride infusion (has no administration in time range)  traMADol (ULTRAM) tablet 50 mg (has no administration in time range)  ketorolac (TORADOL) 15 MG/ML  injection 15 mg (has no administration in time range)  ondansetron (ZOFRAN) tablet 4 mg (has no administration in time range)    Or  ondansetron (ZOFRAN) injection 4 mg (has no administration in time range)  multivitamin with minerals tablet 1 tablet (0 tablets Oral Hold 09/10/17 1713)  sodium chloride 0.9 % bolus 1,000 mL (0 mLs Intravenous Stopped 09/10/17 1240)  lactulose (CHRONULAC) enema 200 gm (300 mLs Rectal Given 09/10/17 1413)     Initial Impression / Assessment and Plan / ED Course  I have reviewed the triage vital signs and the nursing notes.  Pertinent labs & imaging results that were available during my care of the patient were reviewed by me and considered in my medical decision making (see chart for details).    62 year old male with past medical history as above here with altered mental status.  On arrival, patient is altered but protecting his airway.  Is responsive to painful stimuli.  I suspect acute hepatic encephalopathy.  He is bradycardic which I suspect is secondary to this as well and he has a history of the same.  Denies any chest pain.  Lab work shows mild lactic acidosis which I suspect is secondary to his underlying liver disease.  Ammonia markedly elevated at 124.  Patient given lactulose enema given his altered mental status and will admit to the stepdown.  CT head negative.  Chest x-ray clear.  No leukocytosis, tachycardia, hypotension, or signs of sepsis.   Final Clinical Impressions(s) / ED Diagnoses   Final diagnoses:  Hepatic encephalopathy [facility])    ED Discharge Orders    None       Duffy Bruce, MD 09/10/17 [phone]

## 2017-09-10 NOTE — ED Notes (Signed)
This RN went in to complete in and out cath and patient continues to have inflamed/swollen testicle to the point that the tip of the penis is unable to be visualized by swelling.  Do not feel safe performing catheterization at this time

## 2017-09-10 NOTE — ED Triage Notes (Signed)
Patient has a history of Hep C with liver cancer , per family patient drank his lactulose last pm , this am she went to pick him up to go to a doctors appointment and patient was unresponsive.

## 2017-09-10 NOTE — ED Notes (Signed)
Phlebotomy contacted for assistance with blood draws

## 2017-09-10 NOTE — ED Notes (Signed)
This RN attempted for blood.  Very minimal result

## 2017-09-11 DIAGNOSIS — N184 Chronic kidney disease, stage 4 (severe): Secondary | ICD-10-CM

## 2017-09-11 DIAGNOSIS — D61818 Other pancytopenia: Secondary | ICD-10-CM

## 2017-09-11 DIAGNOSIS — R001 Bradycardia, unspecified: Secondary | ICD-10-CM

## 2017-09-11 DIAGNOSIS — I10 Essential (primary) hypertension: Secondary | ICD-10-CM

## 2017-09-11 DIAGNOSIS — K7031 Alcoholic cirrhosis of liver with ascites: Secondary | ICD-10-CM

## 2017-09-11 LAB — COMPREHENSIVE METABOLIC PANEL
ALBUMIN: 2.1 g/dL — AB (ref 3.5–5.0)
ALT: 64 U/L — ABNORMAL HIGH (ref 17–63)
ANION GAP: 13 (ref 5–15)
AST: 92 U/L — AB (ref 15–41)
Alkaline Phosphatase: 178 U/L — ABNORMAL HIGH (ref 38–126)
BILIRUBIN TOTAL: 7 mg/dL — AB (ref 0.3–1.2)
BUN: 29 mg/dL — AB (ref 6–20)
CHLORIDE: 104 mmol/L (ref 101–111)
CO2: 19 mmol/L — AB (ref 22–32)
Calcium: 8.3 mg/dL — ABNORMAL LOW (ref 8.9–10.3)
Creatinine, Ser: 2.36 mg/dL — ABNORMAL HIGH (ref 0.61–1.24)
GFR calc Af Amer: 33 mL/min — ABNORMAL LOW (ref >60)
GFR calc non Af Amer: 28 mL/min — ABNORMAL LOW (ref >60)
Glucose, Bld: 119 mg/dL — ABNORMAL HIGH (ref 65–99)
POTASSIUM: 3.7 mmol/L (ref 3.5–5.1)
SODIUM: 136 mmol/L (ref 135–145)
TOTAL PROTEIN: 6.3 g/dL — AB (ref 6.5–8.1)

## 2017-09-11 LAB — APTT: APTT: 43 s — AB (ref 24–36)

## 2017-09-11 LAB — MRSA PCR SCREENING: MRSA by PCR: NEGATIVE

## 2017-09-11 LAB — PROTIME-INR
INR: 1.42
PROTHROMBIN TIME: 17.2 s — AB (ref 11.4–15.2)

## 2017-09-11 LAB — AMMONIA: Ammonia: 77 umol/L — ABNORMAL HIGH (ref 9–35)

## 2017-09-11 MED ORDER — NICOTINE 14 MG/24HR TD PT24
14.0000 mg | MEDICATED_PATCH | Freq: Every day | TRANSDERMAL | Status: DC
  Administered 2017-09-11 – 2017-09-12 (×2): 14 mg via TRANSDERMAL
  Filled 2017-09-11 (×2): qty 1

## 2017-09-11 MED ORDER — LACTULOSE 10 GM/15ML PO SOLN
30.0000 g | Freq: Three times a day (TID) | ORAL | Status: DC
  Administered 2017-09-11 – 2017-09-12 (×4): 30 g via ORAL
  Filled 2017-09-11 (×3): qty 45

## 2017-09-11 NOTE — Progress Notes (Signed)
Pt awake and alert, asking for something to eat.  Pt assisted by this RN in eating a full liquid dinner tray.  Pt drank 1 container of tea and 1 carton of milk via straw without difficulty; ate 1 pudding cup and 1 bowl of jello without difficulty.  However, pt began coughing violently when drinking soup from a straw.  O2 sats dropped to 70s, and O2 @ 3L/[place] was applied; sats increased to 95% with oxygen.  Jose [name] was notified at 2050.  Call back received at 2157.  No new orders received.  Pt recovered and was able to wean off oxygen and maintain sats at 98-100% on room air.

## 2017-09-11 NOTE — Plan of Care (Signed)
  Problem: Clinical Measurements: Goal: Will remain free from infection Outcome: Progressing Note:  No s/s of infection noted. Goal: Respiratory complications will improve Outcome: Progressing Note:  No s/s of respiratory complications noted. Goal: Cardiovascular complication will be avoided Outcome: Progressing Note:  No s/s of cardiovascular complications noted.

## 2017-09-11 NOTE — Progress Notes (Signed)
[facility] PROGRESS NOTE  Jose [name] BDZ:[id number] DOB: [birth year] DOA: 09/10/2017  PCP: [name], MD  Brief History/Interval Summary: 62 year old African-American male with a past medical history of alcoholic liver cirrhosis which appears to be quite advanced, hepatitis C, esophageal paresis, history of hypertension, history of QT prolongation, pancytopenia, previous history of hepatic encephalopathy presented after he was found to be confused at home.  His ammonia level was noted to be significantly elevated.  He was hospitalized for further management.  Reason for Visit: Acute hepatic encephalopathy.  Consultants: Palliative medicine  Procedures: None  Antibiotics: None  Subjective/Interval History: Patient awake and alert this morning.  He denies any complaints.  He is concerned about his scrotal swelling which has been present for few weeks.  He was told about his poor prognosis.  Patient seems to be in some denial.  He expects to get better.  He was told that this was unlikely.  He is agreeable to talk to palliative medicine.  ROS: Denies any nausea or vomiting  Objective:  Vital Signs  Vitals:   09/10/17 1857 09/10/17 1858 09/11/17 0500 09/11/17 0803  BP:  (!) 141/92 117/87 (!) 151/73  Pulse:  97 75 (!) 58  Resp:  13 14 18   Temp:  97.7 F (36.5 C) 97.9 F (36.6 C) 97.8 F (36.6 C)  TempSrc:  Oral Oral Oral  SpO2:  99% 99% 100%  Weight: 73.1 kg (161 lb 2.5 oz)  73.2 kg (161 lb 6 oz)   Height: 5\' 4"  (1.626 m)       Intake/Output Summary (Last 24 hours) at 09/11/2017 1126 Last data filed at 09/11/2017 0700 Gross per 24 hour  Intake 3108.75 ml  Output 1825 ml  Net 1283.75 ml   Filed Weights   09/10/17 1857 09/11/17 0500  Weight: 73.1 kg (161 lb 2.5 oz) 73.2 kg (161 lb 6 oz)    General appearance: alert, cooperative, appears stated age and no distress Head: Normocephalic, without obvious abnormality, atraumatic Throat: lips, mucosa, and  tongue normal; teeth and gums normal Resp: clear to auscultation bilaterally Cardio: regular rate and rhythm, S1, S2 normal, no murmur, click, rub or gallop GI: Abdomen is mildly distended.  Nontender.  No masses appreciated.  Bowel sounds present. Extremities: Minimal edema noted bilateral lower extremities Pulses: 2+ and symmetric Neurologic: No obvious deficits noted.  Lab Results:  Data Reviewed: I have personally reviewed following labs and imaging studies  CBC: Recent Labs  Lab 09/10/17 1133  WBC 3.6*  NEUTROABS 2.6  HGB 11.0*  HCT 33.8*  MCV 81.1  PLT 71*    Basic Metabolic Panel: Recent Labs  Lab 09/10/17 1133 09/11/17 0552  NA 133* 136  K 4.4 3.7  CL 101 104  CO2 18* 19*  GLUCOSE 102* 119*  BUN 25* 29*  CREATININE 2.36* 2.36*  CALCIUM 8.0* 8.3*    GFR: Estimated Creatinine Clearance: 30.1 mL/min (A) (by C-G formula based on SCr of 2.36 mg/dL (H)).  Liver Function Tests: Recent Labs  Lab 09/10/17 1133 09/11/17 0552  AST 102* 92*  ALT 63 64*  ALKPHOS 185* 178*  BILITOT 5.1* 7.0*  PROT 5.9* 6.3*  ALBUMIN 2.1* 2.1*    Recent Labs  Lab 09/10/17 1133  LIPASE 62*   Recent Labs  Lab 09/10/17 1044 09/11/17 0552  AMMONIA 124* 77*    Coagulation Profile: Recent Labs  Lab 09/11/17 0552  INR 1.42     Recent Results (from the past 240 hour(s))  Blood  culture (routine x 2)     Status: None (Preliminary result)   Collection Time: 09/10/17 12:22 PM  Result Value Ref Range Status   Specimen Description BLOOD BLOOD LEFT WRIST  Final   Special Requests   Final    BOTTLES DRAWN AEROBIC ONLY Blood Culture results may not be optimal due to an inadequate volume of blood received in culture bottles   Culture   Final    NO GROWTH < 24 HOURS Performed at [facility], [address], [place], [place] [address]    Report Status PENDING  Incomplete  Blood culture (routine x 2)     Status: None (Preliminary result)   Collection Time: 09/10/17  12:22 PM  Result Value Ref Range Status   Specimen Description BLOOD LEFT HAND  Final   Special Requests   Final    BOTTLES DRAWN AEROBIC AND ANAEROBIC Blood Culture results may not be optimal due to an inadequate volume of blood received in culture bottles   Culture   Final    NO GROWTH < 24 HOURS Performed at [facility], [place] [address], [place], [place] [address]    Report Status PENDING  Incomplete  MRSA PCR Screening     Status: None   Collection Time: 09/10/17  6:58 PM  Result Value Ref Range Status   MRSA by PCR NEGATIVE NEGATIVE Final    Comment:        The GeneXpert MRSA Assay (FDA approved for NASAL specimens only), is one component of a comprehensive MRSA colonization surveillance program. It is not intended to diagnose MRSA infection nor to guide or monitor treatment for MRSA infections. Performed at [facility], [place] [address], [place], [place] [address]       Radiology Studies: Ct Head Wo Contrast  Result Date: 09/10/2017 CLINICAL DATA:  63 year old male found unresponsive. EXAM: CT HEAD WITHOUT CONTRAST TECHNIQUE: Contiguous axial images were obtained from the base of the skull through the vertex without intravenous contrast. COMPARISON:  Head CT 07/12/2017 and earlier. FINDINGS: Brain: Patchy and confluent bilateral cerebral white matter hypodensity appears stable since March. Cerebral volume remains within normal limits. No midline shift, ventriculomegaly, mass effect, evidence of mass lesion, intracranial hemorrhage or evidence of cortically based acute infarction. No cortical encephalomalacia identified. Vascular: Calcified atherosclerosis at the skull base. No suspicious intracranial vascular hyperdensity. Skull: No acute osseous abnormality identified. Chronic bilateral lamina papyracea fractures. Sinuses/Orbits: Visualized paranasal sinuses and mastoids are stable and well pneumatized. Chronic bilateral lamina papyracea fractures. Other: Visualized  orbits and scalp soft tissues are within normal limits. IMPRESSION: No acute intracranial abnormality. Stable non contrast CT appearance of the brain with moderately advanced chronic cerebral white matter disease. Electronically Signed   By: Genevie Ann M.D.   On: 09/10/2017 12:42   Dg Chest Portable 1 View  Result Date: 09/10/2017 CLINICAL DATA:  Unresponsive, history of hepatitis-C with cirrhosis and liver cancer, had lactulose last night, smoker EXAM: PORTABLE CHEST 1 VIEW COMPARISON:  Portable exam 1103 hours compared to 08/22/2017 FINDINGS: Enlargement of cardiac silhouette likely accentuated by hypoinflation. Atherosclerotic calcification aorta. Low lung volumes with bibasilar atelectasis. Upper lungs clear. No pleural effusion or pneumothorax. Bones demineralized. IMPRESSION: Low lung volumes with bibasilar atelectasis. Electronically Signed   By: Lavonia Dana M.D.   On: 09/10/2017 11:15     Medications:  Scheduled: . cholecalciferol  1,000 Units Oral Daily  . enoxaparin (LOVENOX) injection  30 mg Subcutaneous Q24H  . furosemide  40 mg Oral Daily  . lactulose  30 g Oral TID  . multivitamin with minerals  1 tablet Oral Daily  . pantoprazole  40 mg Oral BID  . rifaximin  550 mg Oral BID  . sodium chloride flush  3 mL Intravenous Q12H  . spironolactone  50 mg Oral Daily   Continuous:  [id number], hydroxypropyl methylcellulose / hypromellose, ketorolac, ondansetron **OR** ondansetron (ZOFRAN) IV, traMADol  Assessment/Plan:    Acute hepatic encephalopathy Patient mentions that he has been compliant with his lactulose although he may have missed a dose or 2.  Unclear if this occurred because of noncompliance or just reflects worsening disease burden.  Mental status appears to have improved.  Ammonia level is better.  Change to oral lactulose.  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  He was seen by  nephrology.  There was some concern for renal syndrome but was not thought to be the case by nephrology.  His renal function is at baseline now.  Continue to monitor urine output.  Patient is not a candidate for any further work-up or interventions for his renal disease.  Bradycardia Heart rate has improved.  Etiology unclear.  Seems to be asymptomatic.  Blood pressure is stable.  Essential hypertension Blood pressure is reasonably well controlled.  Continue to monitor.  Severe protein calorie malnutrition Encourage oral intake.  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  He was told regarding his poor prognosis.  He was told that he is likely candidate for hospice services.  Patient is willing to talk to palliative medicine who has been consulted.  Anasarca with scrotal swelling Most likely due to decompensated liver disease.  Continue with diuretics.  Scrotal support.  Pancytopenia This is secondary to his liver disease.  Stable.  DVT Prophylaxis: Lovenox was initiated.  Will stop due to thrombocytopenia.  INR noted to be mildly elevated.    Code Status: Full Code Family Communication: No family at bedside Disposition Plan: Management as outlined above.  PT and OT evaluation.    LOS: 1 day   [facility] Pager [phone] 09/11/2017, 11:26 AM  If 7PM-7AM, please contact night-coverage at [email or web address], password [facility]

## 2017-09-12 DIAGNOSIS — Z7189 Other specified counseling: Secondary | ICD-10-CM

## 2017-09-12 DIAGNOSIS — Z515 Encounter for palliative care: Secondary | ICD-10-CM

## 2017-09-12 DIAGNOSIS — K7290 Hepatic failure, unspecified without coma: Secondary | ICD-10-CM

## 2017-09-12 LAB — BASIC METABOLIC PANEL
Anion gap: 11 (ref 5–15)
BUN: 28 mg/dL — ABNORMAL HIGH (ref 6–20)
CALCIUM: 8.2 mg/dL — AB (ref 8.9–10.3)
CO2: 20 mmol/L — ABNORMAL LOW (ref 22–32)
CREATININE: 2.01 mg/dL — AB (ref 0.61–1.24)
Chloride: 103 mmol/L (ref 101–111)
GFR calc non Af Amer: 34 mL/min — ABNORMAL LOW (ref >60)
GFR, EST AFRICAN AMERICAN: 39 mL/min — AB (ref >60)
Glucose, Bld: 126 mg/dL — ABNORMAL HIGH (ref 65–99)
Potassium: 3 mmol/L — ABNORMAL LOW (ref 3.5–5.1)
SODIUM: 134 mmol/L — AB (ref 135–145)

## 2017-09-12 LAB — CBC
HCT: 30 % — ABNORMAL LOW (ref 39.0–52.0)
Hemoglobin: 10 g/dL — ABNORMAL LOW (ref 13.0–17.0)
MCH: 27 pg (ref 26.0–34.0)
MCHC: 33.3 g/dL (ref 30.0–36.0)
MCV: 81.1 fL (ref 78.0–100.0)
PLATELETS: 68 10*3/uL — AB (ref 150–400)
RBC: 3.7 MIL/uL — ABNORMAL LOW (ref 4.22–5.81)
RDW: 25.3 % — AB (ref 11.5–15.5)
WBC: 5.9 10*3/uL (ref 4.0–10.5)

## 2017-09-12 LAB — AMMONIA: AMMONIA: 63 umol/L — AB (ref 9–35)

## 2017-09-12 MED ORDER — POTASSIUM CHLORIDE CRYS ER 20 MEQ PO TBCR
40.0000 meq | EXTENDED_RELEASE_TABLET | Freq: Four times a day (QID) | ORAL | Status: DC
  Administered 2017-09-12: 40 meq via ORAL
  Filled 2017-09-12: qty 2

## 2017-09-12 NOTE — Plan of Care (Signed)
  Problem: Clinical Measurements: Goal: Will remain free from infection Outcome: Progressing Note:  No s/s of infection noted. Goal: Respiratory complications will improve Outcome: Progressing Note:  No s/s of respiratory complications noted. Goal: Cardiovascular complication will be avoided Outcome: Progressing Note:  No s/s of cardiovascular complication.

## 2017-09-12 NOTE — Consult Note (Signed)
Consultation Note Date: 09/12/2017   Patient Name: Jose [name]  DOB: Dec 31, 1955  MRN: [id number]  Age / Sex: 62 y.o., male  PCP: Jose Mccreedy, MD Referring Physician: Bonnielee Haff, MD  Reason for Consultation: Establishing goals of care and Psychosocial/spiritual support  HPI/Patient Profile: 62 y.o. male  with past medical history of polysubstance use disorder, alcohol abuse, thrombocytopenia, hemochromatosis, hepatitis C, new diagnosis of hepatocellular carcinoma, chronic kidney disease stage IV admitted on 09/10/2017 with altered mental status.  Patient's ammonia level upon admission was 124.Marland Kitchen  He was given lactulose, rectal tube placed.  His ammonia level as of 09/12/2017 is now 63.  Patient was just seen by palliative medicine services in April as well as Aug 26, 2017.  Consult ordered for ongoing goals of care discussion.  Clinical Assessment and Goals of Care: Patient seen, chart reviewed.  Patient although more alert than when he first came in, still appears to have short-term memory deficits, he is very irritable.  Patient continues to verbalize that he is turning his health over  to God.  We have attempted in the past to have goals of care discussion but Jose [name] displays poor health care literacy and  I believe is very overwhelmed with his new cancer diagnosis.  He does tell me today that he knows that he only has 6 months to live.  I attempted to revisit the conversation such as  if that is the case, what would be most important to him during these 6 months.  He continues to verbalize wanting to go back home and live as long as he can.  He displays poor insight into his ability to care for himself in the setting of this progressive, terminal disease.  His main source of support is his Sister Jose [name].  I did call Jose [name] this morning.  She continues to offer support to Jose [name] but to is  frustrated by what she sees as some overwhelming psychosocial issues.  Jose [name] is of the opinion that Jose [name] CODE STATUS should be DNR but as long as he is alert and speaking somewhat for himself he continues to say he wants everything done.  My sense from talking to Jose [name], is that this CODE STATUS will not reverse until Jose [name] is completely unable to speak for himself.  Jose [name] is still capable of speaking for himself however his healthcare literacy now in the setting of worsening clinical status secondary to his hepatocellular carcinoma, recurrent hepatic encephalopathy, is becoming impaired.  His sister, Jose [name] at [phone] would be his healthcare proxy in the event he were unable to speak for himself.  It does appear is that when that is the case she will likely make him a DNR.  She does reference a conversation that she had with the emergency room physician that that was her opinion but is struggling with doing that as long as Jose [name] is somewhat alert.  She does verify that his apartment has been sprayed for bedbugs.  He does have a cousin, Jose [name], who  is starting to come in and help him.  They also mention that he has home health that is supposed to start in 2 weeks (I am not sure why there is this delay).  I also discussed with Jose [name] going to a skilled nursing facility for extra help but he adamantly refused.  Jose [name] would be in favor of that but recognizes that he is refusing    SUMMARY OF RECOMMENDATIONS   Continue full scope of treatment including full code Jose [name] will be vital to future goals of care decision especially is Jose [name] becomes more ill. Another option to help ensure ongoing goals of care out in the community as patient declines would be to affiliate with community palliative providers either through care connections, [phone], or hospice and palliative care services of [facility], palliative medicine division, [phone]. Recommend patient be  affiliated with a home health service Code Status/Advance Care Planning:  Full code   Palliative Prophylaxis:   Bowel Regimen, Delirium Protocol, Frequent Pain Assessment, Oral Care and Turn Reposition  Additional Recommendations (Limitations, Scope, Preferences):  Full Scope Treatment  Psycho-social/Spiritual:   Desire for further Chaplaincy support:no  Additional Recommendations: Referral to Community Resources   Prognosis:  < 6 months in the setting of hepatocellular carcinoma, hepatic encephalopathy, chronic kidney disease stage IV.  Patient is at high risk for acute decompensation; if that were to happen he would qualify for residential hospice Discharge Planning: Home with Home Health      Primary Diagnoses: Present on Admission: . Hepatic encephalopathy ([place]) . (Resolved) Thrombocytopenia ([place]) . Alcoholic cirrhosis of liver with ascites ([place]) . Essential hypertension . Hepatitis C, chronic ([place]) . Atopic dermatitis . Alcoholism ([place]) . Protein-calorie malnutrition, severe ([place]) . Hepatorenal syndrome ([place]) . End stage liver disease ([place]) . Pancytopenia, acquired ([place]) . Polysubstance abuse ([place]) . Bradycardia   I have reviewed the medical record, interviewed the patient and family, and examined the patient. The following aspects are pertinent.  Past Medical History:  Diagnosis Date  . Alcohol dependence ([place])   . Ascites   . Cancer ([place])   . Cirrhosis ([place])   . Depression   . 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 Hepatitis C    Hepatitis C  . HOH (hard of hearing)   . Hypertension   . Inguinal hernia    right  . QT prolongation   . Shortness of breath    with exertion  . Thrombocytopenia ([place])    Social History   Socioeconomic History  . Marital status: Single    Spouse name: Not on file  . Number of children: Not on file  . Years of education: Not on file  . Highest education level: Not on  file  Occupational History  . Not on file  Social Needs  . Financial resource strain: Not on file  . Food insecurity:    Worry: Not on file    Inability: Not on file  . Transportation needs:    Medical: Not on file    Non-medical: Not on file  Tobacco Use  . Smoking status: Current Some Day Smoker    Packs/day: 0.12    Years: 47.00    Pack years: 5.64    Types: Cigarettes  . Smokeless tobacco: Never Used  Substance and Sexual Activity  . Alcohol use: Yes    Comment: denies any in the last few weeks  . Drug use: No  Types: Marijuana, Cocaine    Comment: Last time for cocaine a few weeks ago ;Marijuana- intermittent  . Sexual activity: Not Currently    Comment: MPOA sister and Jose [name]  Lifestyle  . Physical activity:    Days per week: Not on file    Minutes per session: Not on file  . Stress: Not on file  Relationships  . Social connections:    Talks on phone: Not on file    Gets together: Not on file    Attends religious service: Not on file    Active member of club or organization: Not on file    Attends meetings of clubs or organizations: Not on file    Relationship status: Not on file  Other Topics Concern  . Not on file  Social History Narrative   ** Merged History Encounter **       Family History  Problem Relation Age of Onset  . Breast cancer Other   . Diabetes Other   . Hypertension Other   . Breast cancer Mother   . Hypertension Mother   . Diabetes Father   . Hypertension Sister   . Heart disease Sister   . Hypertension Paternal Grandmother   . Colon cancer Neg Hx    Scheduled Meds: . cholecalciferol  1,000 Units Oral Daily  . furosemide  40 mg Oral Daily  . lactulose  30 g Oral TID  . multivitamin with minerals  1 tablet Oral Daily  . nicotine  14 mg Transdermal Daily  . pantoprazole  40 mg Oral BID  . potassium chloride  40 mEq Oral Q6H  . rifaximin  550 mg Oral BID  . sodium chloride flush  3 mL Intravenous Q12H  . spironolactone   50 mg Oral Daily   Continuous Infusions: PRN Meds:.albuterol, hydroxypropyl methylcellulose / hypromellose, ketorolac, ondansetron **OR** ondansetron (ZOFRAN) IV, traMADol Medications Prior to Admission:  Prior to Admission medications   Medication Sig Start Date End Date Taking? Authorizing Provider  Cholecalciferol (VITAMIN D-3) 1000 units CAPS Take 1,000 Units by mouth daily.   Yes [provider]  furosemide (LASIX) 40 MG tablet Take 1 tablet (40 mg total) by mouth daily. 08/09/17  Yes Georgette Shell, MD  hydroxypropyl methylcellulose / hypromellose (ISOPTO TEARS / GONIOVISC) 2.5 % ophthalmic solution Place 2 drops into both eyes 3 (three) times daily as needed for dry eyes.   Yes [provider]  lactulose (CHRONULAC) 10 GM/15ML solution Take 45 mLs (30 g total) by mouth 4 (four) times daily. 08/28/17  Yes Patrecia Pour, Christean Grief, MD  Multiple Vitamin (TAB-A-VITE) TABS Take 1 tablet by mouth daily.   Yes [provider]  pantoprazole (PROTONIX) 40 MG tablet Take 1 tablet (40 mg total) by mouth 2 (two) times daily. 08/08/17  Yes Georgette Shell, MD  PROAIR HFA 108 [phone] Base) MCG/ACT inhaler Inhale 2 puffs into the lungs 4 (four) times daily as needed for shortness of breath or wheezing. 04/02/17  Yes [provider]  rifaximin (XIFAXAN) 550 MG TABS tablet Take 1 tablet (550 mg total) by mouth 2 (two) times daily. 11/14/16  Yes Danis, Kirke Corin, MD  spironolactone (ALDACTONE) 50 MG tablet Take 1 tablet (50 mg total) by mouth daily. 08/09/17  Yes Georgette Shell, MD  fluticasone [facility]) 50 MCG/ACT nasal spray Place 1 spray into both nostrils daily as needed for allergies.     [provider]  ondansetron (ZOFRAN) 4 MG tablet Take 1 tablet (4  mg total) by mouth every 6 (six) hours as needed for nausea. 08/08/17   Georgette Shell, MD  oxyCODONE (OXY IR/ROXICODONE) 5 MG immediate release tablet Take 1 tablet (5 mg total) by mouth 2 (two) times  daily. Patient not taking: Reported on 09/10/2017 08/08/17   Georgette Shell, MD   No Known Allergies Review of Systems  Unable to perform ROS: Other    Physical Exam  Constitutional:  Acutely ill appearing older man, no acute distress  HENT:  Head: Normocephalic and atraumatic.  Neck: Normal range of motion.  Cardiovascular: Normal rate.  Pulmonary/Chest: Effort normal.  Abdominal:  Ascites  Genitourinary:  Genitourinary Comments: Rectal tube  Musculoskeletal:  Very weak  Neurological: He is alert.  Oriented to person place and situation Report insight into the seriousness of his current medical condition  Skin: Skin is warm and dry.  Psychiatric:  Patient is irritable especially when questioned about goals of care decision. Poor healthcare knowledge as well as patient is overwhelmed.  He does share with me that he has been told he has only 6 months to live  Nursing note and vitals reviewed.   Vital Signs: BP 126/87 (BP Location: Left Arm)   Pulse (!) 58   Temp 98.1 F (36.7 C) (Oral)   Resp 12   Ht 5\' 4"  (1.626 m)   Wt 75.4 kg (166 lb 4.8 oz)   SpO2 100%   BMI 28.55 kg/m  Pain Scale: 0-10   Pain Score: 0-No pain   SpO2: SpO2: 100 % O2 Device:SpO2: 100 % O2 Flow Rate: .   IO: Intake/output summary:   Intake/Output Summary (Last 24 hours) at 09/12/2017 8469 Last data filed at 09/11/2017 2100 Gross per 24 hour  Intake 840 ml  Output 175 ml  Net 665 ml    LBM: Last BM Date: 09/11/17 Baseline Weight: Weight: 73.1 kg (161 lb 2.5 oz) Most recent weight: Weight: 75.4 kg (166 lb 4.8 oz)     Palliative Assessment/Data:   Flowsheet Rows     Most Recent Value  Intake Tab  Referral Department  Hospitalist  Unit at Time of Referral  Med/Surg Unit  Palliative Care Primary Diagnosis  Cancer  Date Notified  09/11/17  Palliative Care Type  Return patient Palliative Care  Reason for referral  Clarify Goals of Care  Date of Admission  09/10/17  Date first  seen by Palliative Care  09/12/17  # of days Palliative referral response time  1 Day(s)  # of days IP prior to Palliative referral  1  Clinical Assessment  Palliative Performance Scale Score  40%  Pain Max last 24 hours  Not able to report  Pain Min Last 24 hours  Not able to report  Dyspnea Max Last 24 Hours  Not able to report  Dyspnea Min Last 24 hours  Not able to report  Nausea Max Last 24 Hours  Not able to report  Nausea Min Last 24 Hours  Not able to report  Anxiety Max Last 24 Hours  Not able to report  Anxiety Min Last 24 Hours  Not able to report  Other Max Last 24 Hours  Not able to report  Psychosocial & Spiritual Assessment  Palliative Care Outcomes  Patient/Family meeting held?  Yes  Who was at the meeting?  pt, pt's sister  Palliative Care follow-up planned  No      Time In: 0830 Time Out: 0930 Time Total: 60 min Greater than 50%  of  this time was spent counseling and coordinating care related to the above assessment and plan. Staffed with Dr. [place] Pink  Signed by: Dory Horn, NP   Please contact Palliative Medicine Team phone at [phone] for questions and concerns.  For individual provider: See Shea Evans

## 2017-09-12 NOTE — Evaluation (Signed)
Physical Therapy Evaluation Patient Details Name: Jose [name] MRN: [id number] DOB: 06-19-1955 Today's Date: 09/12/2017   History of Present Illness  Pt is a 62 y.o. male with medical history significant for alcoholic cirrhosis of the liver, hepatitis C, esophageal varices, hypertension, QT prolongation, and pancytopenia. He was admitted for hepatic encephalopathy.     Clinical Impression  Pt admitted with above diagnosis. Pt currently with functional limitations due to the deficits listed below (see PT Problem List). On eval, pt required supervision bed mobility, min guard assist transfers, and min guard assist ambulation 50 feet with RW. Pt will benefit from skilled PT to increase their independence and safety with mobility to allow discharge to the venue listed below.       Follow Up Recommendations Home health PT;Supervision - Intermittent    Equipment Recommendations  None recommended by PT    Recommendations for Other Services       Precautions / Restrictions Precautions Precautions: Fall      Mobility  Bed Mobility Overal bed mobility: Needs Assistance Bed Mobility: Supine to Sit;Sit to Supine     Supine to sit: Supervision;HOB elevated Sit to supine: Supervision;HOB elevated   General bed mobility comments: supervision for safety, increased time and effort  Transfers Overall transfer level: Needs assistance Equipment used: Rolling walker (2 wheeled) Transfers: Sit to/from Stand Sit to Stand: Min guard         General transfer comment: min guard for safety, no physical assist  Ambulation/Gait Ambulation/Gait assistance: Min guard Ambulation Distance (Feet): 50 Feet Assistive device: Rolling walker (2 wheeled) Gait Pattern/deviations: Step-through pattern;Wide base of support;Decreased stride length Gait velocity: decreased Gait velocity interpretation: 1.31 - 2.62 ft/sec, indicative of limited community ambulator General Gait Details: wider BOS due to  scrotal edema, steady gait with RW  Stairs            Wheelchair Mobility    Modified Rankin (Stroke Patients Only)       Balance Overall balance assessment: Needs assistance Sitting-balance support: No upper extremity supported;Feet supported Sitting balance-Leahy Scale: Good     Standing balance support: During functional activity Standing balance-Leahy Scale: Fair                               Pertinent Vitals/Pain Pain Assessment: Faces Faces Pain Scale: Hurts a little bit Pain Location: enlarged scrotum Pain Descriptors / Indicators: Grimacing;Sore Pain Intervention(s): Monitored during session    Home Living Family/patient expects to be discharged to:: Private residence Living Arrangements: Alone Available Help at Discharge: Family;Available PRN/intermittently Type of Home: Apartment Home Access: Elevator     Home Layout: One level Home Equipment: Walker - 2 wheels;Cane - single point      Prior Function Level of Independence: Independent with assistive device(s)         Comments: Primarily uses RW for ambulation     Hand Dominance   Dominant Hand: Right    Extremity/Trunk Assessment   Upper Extremity Assessment Upper Extremity Assessment: Generalized weakness    Lower Extremity Assessment Lower Extremity Assessment: Generalized weakness    Cervical / Trunk Assessment Cervical / Trunk Assessment: Normal  Communication   Communication: No difficulties  Cognition Arousal/Alertness: Awake/alert Behavior During Therapy: Flat affect Overall Cognitive Status: Within Functional Limits for tasks assessed  General Comments      Exercises     Assessment/Plan    PT Assessment Patient needs continued PT services  PT Problem List Decreased strength;Decreased mobility;Decreased activity tolerance;Pain;Decreased balance;Decreased knowledge of precautions;Decreased knowledge  of use of DME       PT Treatment Interventions DME instruction;Therapeutic activities;Gait training;Therapeutic exercise;Patient/family education;Balance training;Functional mobility training    PT Goals (Current goals can be found in the Care Plan section)  Acute Rehab PT Goals Patient Stated Goal: home PT Goal Formulation: With patient Time For Goal Achievement: 09/26/17 Potential to Achieve Goals: Fair    Frequency Min 3X/week   Barriers to discharge        Co-evaluation               AM-PAC PT "6 Clicks" Daily Activity  Outcome Measure Difficulty turning over in bed (including adjusting bedclothes, sheets and blankets)?: A Little Difficulty moving from lying on back to sitting on the side of the bed? : A Little Difficulty sitting down on and standing up from a chair with arms (e.g., wheelchair, bedside commode, etc,.)?: A Little Help needed moving to and from a bed to chair (including a wheelchair)?: None Help needed walking in hospital room?: None Help needed climbing 3-5 steps with a railing? : A Little 6 Click Score: 20    End of Session Equipment Utilized During Treatment: Gait belt Activity Tolerance: Patient tolerated treatment well Patient left: in bed;with call bell/phone within reach;with bed alarm set Nurse Communication: Mobility status PT Visit Diagnosis: Other abnormalities of gait and mobility (R26.89)    Time: 1202-1218 PT Time Calculation (min) (ACUTE ONLY): 16 min   Charges:   PT Evaluation $PT Eval Low Complexity: 1 Low     PT G Codes:        Lorrin Goodell, PT  Office # [phone] Pager [phone]   Lorriane Shire 09/12/2017, 1:18 PM

## 2017-09-12 NOTE — Progress Notes (Signed)
Patient is for discharge home today; he is established with [name] services as prior to admission; his sister Jose [name] is to provide transportation home at her request. Aneta Mins [phone]

## 2017-09-12 NOTE — Discharge Summary (Signed)
[facility]  Physician Discharge Summary   Patient ID: Jose [name] MRN: [id number] DOB/AGE: August 01, 1955 62 y.o.  Admit date: 09/10/2017 Discharge date: 09/12/2017  PCP: [name], MD  DISCHARGE DIAGNOSES:  Acute hepatic encephalopathy  RECOMMENDATIONS FOR OUTPATIENT FOLLOW UP: 1. Follow-up with primary care provider. 2. Continue conversation regarding palliative care and hospice in the outpatient setting   DISCHARGE CONDITION: fair  Diet recommendation: Low-sodium  Filed Weights   09/10/17 1857 09/11/17 0500 09/12/17 0622  Weight: 73.1 kg (161 lb 2.5 oz) 73.2 kg (161 lb 6 oz) 75.4 kg (166 lb 4.8 oz)    INITIAL HISTORY: 62 year old African-American male with a past medical history of alcoholic liver cirrhosis which appears to be quite advanced, hepatitis C, esophageal paresis, history of hypertension, history of QT prolongation, pancytopenia, previous history of hepatic encephalopathy presented after he was found to be confused at home.  His ammonia level was noted to be significantly elevated.  He was hospitalized for further management.  Consultations:  [place] COURSE:   Acute hepatic encephalopathy Patient mentions that he has been compliant with his lactulose although he may have missed a dose or 2.  Unclear if this occurred because of noncompliance or just reflects worsening disease burden.  Patient was given lactulose enemas.  His ammonia level has significantly improved.  His mental status appears to be close to baseline.  He was told to be compliant with his home medication regimen.  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  He was seen by nephrology.  There was some concern for hepatorenal syndrome but was not thought to be the case by nephrology.  His renal function is at baseline now.  Continue to monitor urine output.  Patient  is not a candidate for any further work-up or interventions for his renal disease.  Potassium to be repleted.  Bradycardia Heart rate has improved.  Etiology unclear.  Seems to be asymptomatic.  Blood pressure is stable.  Essential hypertension Blood pressure is reasonably well controlled.    Severe protein calorie malnutrition Encourage oral intake.  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  He was told regarding his poor prognosis.  He was told that he is likely candidate for hospice services.  Patient willing to talk to palliative medicine who has been consulted.  They did discuss with patient this morning but he again is reluctant to considering hospice.  He still wants to be full code.  Anasarca with scrotal swelling Most likely due to decompensated liver disease.  Continue with diuretics.  Scrotal support.  Ultrasound done recently did not show any testicular torsion.  Pancytopenia This is secondary to his liver disease.  Stable.  Overall stable.  Patient wants to go home today.  Okay for discharge home.   PERTINENT LABS:  The results of significant diagnostics from this hospitalization (including imaging, microbiology, ancillary and laboratory) are listed below for reference.    Microbiology: Recent Results (from the past 240 hour(s))  Blood culture (routine x 2)     Status: None (Preliminary result)   Collection Time: 09/10/17 12:22 PM  Result Value Ref Range Status   Specimen Description BLOOD BLOOD LEFT WRIST  Final   Special Requests   Final    BOTTLES DRAWN AEROBIC ONLY Blood Culture results may not be optimal due to an inadequate volume of  blood received in culture bottles   Culture   Final    NO GROWTH 2 DAYS Performed at [facility], [place] [address] [place], [place] [address]    Report Status PENDING  Incomplete  Blood culture (routine x 2)     Status: None  (Preliminary result)   Collection Time: 09/10/17 12:22 PM  Result Value Ref Range Status   Specimen Description BLOOD LEFT HAND  Final   Special Requests   Final    BOTTLES DRAWN AEROBIC AND ANAEROBIC Blood Culture results may not be optimal due to an inadequate volume of blood received in culture bottles   Culture   Final    NO GROWTH 2 DAYS Performed at [facility], [place] [address], [place], [place] [address]    Report Status PENDING  Incomplete  MRSA PCR Screening     Status: None   Collection Time: 09/10/17  6:58 PM  Result Value Ref Range Status   MRSA by PCR NEGATIVE NEGATIVE Final    Comment:        The GeneXpert MRSA Assay (FDA approved for NASAL specimens only), is one component of a comprehensive MRSA colonization surveillance program. It is not intended to diagnose MRSA infection nor to guide or monitor treatment for MRSA infections. Performed at [facility], [place] [address], [place], [place] [address]      Labs: Basic Metabolic Panel: Recent Labs  Lab 09/10/17 1133 09/11/17 0552 09/12/17 0504  NA 133* 136 134*  K 4.4 3.7 3.0*  CL 101 104 103  CO2 18* 19* 20*  GLUCOSE 102* 119* 126*  BUN 25* 29* 28*  CREATININE 2.36* 2.36* 2.01*  CALCIUM 8.0* 8.3* 8.2*   Liver Function Tests: Recent Labs  Lab 09/10/17 1133 09/11/17 0552  AST 102* 92*  ALT 63 64*  ALKPHOS 185* 178*  BILITOT 5.1* 7.0*  PROT 5.9* 6.3*  ALBUMIN 2.1* 2.1*   Recent Labs  Lab 09/10/17 1133  LIPASE 62*   Recent Labs  Lab 09/10/17 1044 09/11/17 0552 09/12/17 0504  AMMONIA 124* 77* 63*   CBC: Recent Labs  Lab 09/10/17 1133 09/12/17 0504  WBC 3.6* 5.9  NEUTROABS 2.6  --   HGB 11.0* 10.0*  HCT 33.8* 30.0*  MCV 81.1 81.1  PLT 71* 68*    IMAGING STUDIES  Ct Head Wo Contrast  Result Date: 09/10/2017 CLINICAL DATA:  62 year old male found unresponsive. EXAM: CT HEAD WITHOUT CONTRAST TECHNIQUE: Contiguous axial images were obtained from the base of the skull  through the vertex without intravenous contrast. COMPARISON:  Head CT 07/12/2017 and earlier. FINDINGS: Brain: Patchy and confluent bilateral cerebral white matter hypodensity appears stable since March. Cerebral volume remains within normal limits. No midline shift, ventriculomegaly, mass effect, evidence of mass lesion, intracranial hemorrhage or evidence of cortically based acute infarction. No cortical encephalomalacia identified. Vascular: Calcified atherosclerosis at the skull base. No suspicious intracranial vascular hyperdensity. Skull: No acute osseous abnormality identified. Chronic bilateral lamina papyracea fractures. Sinuses/Orbits: Visualized paranasal sinuses and mastoids are stable and well pneumatized. Chronic bilateral lamina papyracea fractures. Other: Visualized orbits and scalp soft tissues are within normal limits. IMPRESSION: No acute intracranial abnormality. Stable non contrast CT appearance of the brain with moderately advanced chronic cerebral white matter disease. Electronically Signed   By: Genevie Ann M.D.   On: 09/10/2017 12:42    Dg Chest Portable 1 View  Result Date: 09/10/2017 CLINICAL DATA:  Unresponsive, history of hepatitis-C with cirrhosis and liver  cancer, had lactulose last night, smoker EXAM: PORTABLE CHEST 1 VIEW COMPARISON:  Portable exam 1103 hours compared to 08/22/2017 FINDINGS: Enlargement of cardiac silhouette likely accentuated by hypoinflation. Atherosclerotic calcification aorta. Low lung volumes with bibasilar atelectasis. Upper lungs clear. No pleural effusion or pneumothorax. Bones demineralized. IMPRESSION: Low lung volumes with bibasilar atelectasis. Electronically Signed   By: Lavonia Dana M.D.   On: 09/10/2017 11:15     DISCHARGE EXAMINATION: Vitals:   09/12/17 0100 09/12/17 0622 09/12/17 0747 09/12/17 1230  BP: (!) 126/96 (!) 130/94 126/87 (!) 133/93  Pulse: 85 (!) 58  88  Resp: 15 12  18   Temp: 97.9 F (36.6 C) (!) 97.5 F (36.4 C) 98.1 F (36.7  C) 97.8 F (36.6 C)  TempSrc: Oral Axillary Oral Oral  SpO2: 99% 100%  98%  Weight:  75.4 kg (166 lb 4.8 oz)    Height:       General appearance: alert, cooperative, appears stated age and no distress Resp: clear to auscultation bilaterally Cardio: regular rate and rhythm, S1, S2 normal, no murmur, click, rub or gallop GI: Mildly distended.  Nontender.  DISPOSITION: Home  Discharge Instructions    Call MD for:  extreme fatigue   Complete by:  As directed    Call MD for:  persistant dizziness or light-headedness   Complete by:  As directed    Call MD for:  persistant nausea and vomiting   Complete by:  As directed    Call MD for:  severe uncontrolled pain   Complete by:  As directed    Call MD for:  temperature >100.4   Complete by:  As directed    Diet - low sodium heart healthy   Complete by:  As directed    Discharge instructions   Complete by:  As directed    Please be sure to take your medications as prescribed.  Do not miss the doses of your medications especially the lactulose. Follow-up with your primary care provider within 1 week.  You were cared for by a hospitalist during your hospital stay. If you have any questions about your discharge medications or the care you received while you were in the hospital after you are discharged, you can call the unit and asked to speak with the hospitalist on call if the hospitalist that took care of you is not available. Once you are discharged, your primary care physician will handle any further medical issues. Please note that NO REFILLS for any discharge medications will be authorized once you are discharged, as it is imperative that you return to your primary care physician (or establish a relationship with a primary care physician if you do not have one) for your aftercare needs so that they can reassess your need for medications and monitor your lab values. If you do not have a primary care physician, you can call [phone] for a  physician referral.   Increase activity slowly   Complete by:  As directed         Allergies as of 09/12/2017   No Known Allergies     Medication List    STOP taking these medications   oxyCODONE 5 MG immediate release tablet Commonly known as:  Oxy IR/ROXICODONE     TAKE these medications   fluticasone 50 MCG/ACT nasal spray Commonly known as:  FLONASE Place 1 spray into both nostrils daily as needed for allergies.   furosemide 40 MG tablet Commonly known as:  LASIX Take 1 tablet (40  mg total) by mouth daily.   hydroxypropyl methylcellulose / hypromellose 2.5 % ophthalmic solution Commonly known as:  ISOPTO TEARS / GONIOVISC Place 2 drops into both eyes 3 (three) times daily as needed for dry eyes.   lactulose 10 GM/15ML solution Commonly known as:  CHRONULAC Take 45 mLs (30 g total) by mouth 4 (four) times daily.   ondansetron 4 MG tablet Commonly known as:  ZOFRAN Take 1 tablet (4 mg total) by mouth every 6 (six) hours as needed for nausea.   pantoprazole 40 MG tablet Commonly known as:  PROTONIX Take 1 tablet (40 mg total) by mouth 2 (two) times daily.   PROAIR HFA 108 (90 Base) MCG/ACT inhaler Generic drug:  albuterol Inhale 2 puffs into the lungs 4 (four) times daily as needed for shortness of breath or wheezing.   rifaximin 550 MG Tabs tablet Commonly known as:  XIFAXAN Take 1 tablet (550 mg total) by mouth 2 (two) times daily.   spironolactone 50 MG tablet Commonly known as:  ALDACTONE Take 1 tablet (50 mg total) by mouth daily.   TAB-A-VITE Tabs Take 1 tablet by mouth daily.   Vitamin D-3 1000 units Caps Take 1,000 Units by mouth daily.        Follow-up Information    Osei-Bonsu, Iona Beard, MD. Schedule an appointment as soon as possible for a visit in 1 week(s).   Specialty:  Internal Medicine Contact information: [place] [address] [phone]           TOTAL DISCHARGE TIME: 28 minutes  Bonnielee Haff  [facility] Pager [phone]  09/12/2017, 2:31 PM

## 2017-09-15 ENCOUNTER — Telehealth: Payer: Self-pay

## 2017-09-15 LAB — CULTURE, BLOOD (ROUTINE X 2)
CULTURE: NO GROWTH
Culture: NO GROWTH

## 2017-09-15 NOTE — Telephone Encounter (Signed)
Phone call placed to patient to offer to schedule a visit with Palliative Care. Unable to leave message as mailbox is full.

## 2017-09-15 NOTE — Telephone Encounter (Signed)
Phone call placed to patient's sister to offer to schedule a visit with Palliative Care.VM left

## 2017-09-19 ENCOUNTER — Inpatient Hospital Stay (HOSPITAL_COMMUNITY)
Admission: EM | Admit: 2017-09-19 | Discharge: 2017-09-21 | DRG: 603 | Discharge status: Against Medical Advice | Payer: Medicaid Other | Attending: Internal Medicine | Admitting: Internal Medicine

## 2017-09-19 ENCOUNTER — Other Ambulatory Visit: Payer: Self-pay

## 2017-09-19 ENCOUNTER — Encounter (HOSPITAL_COMMUNITY): Payer: Self-pay | Admitting: Emergency Medicine

## 2017-09-19 ENCOUNTER — Emergency Department (HOSPITAL_BASED_OUTPATIENT_CLINIC_OR_DEPARTMENT_OTHER): Payer: Medicaid Other

## 2017-09-19 DIAGNOSIS — C220 Liver cell carcinoma: Secondary | ICD-10-CM | POA: Diagnosis present

## 2017-09-19 DIAGNOSIS — Z515 Encounter for palliative care: Secondary | ICD-10-CM | POA: Diagnosis present

## 2017-09-19 DIAGNOSIS — R601 Generalized edema: Secondary | ICD-10-CM | POA: Diagnosis not present

## 2017-09-19 DIAGNOSIS — K7460 Unspecified cirrhosis of liver: Secondary | ICD-10-CM | POA: Diagnosis not present

## 2017-09-19 DIAGNOSIS — F17210 Nicotine dependence, cigarettes, uncomplicated: Secondary | ICD-10-CM | POA: Diagnosis present

## 2017-09-19 DIAGNOSIS — R188 Other ascites: Secondary | ICD-10-CM | POA: Diagnosis not present

## 2017-09-19 DIAGNOSIS — K7210 Chronic hepatic failure without coma: Secondary | ICD-10-CM | POA: Diagnosis present

## 2017-09-19 DIAGNOSIS — L0390 Cellulitis, unspecified: Secondary | ICD-10-CM | POA: Diagnosis present

## 2017-09-19 DIAGNOSIS — F1020 Alcohol dependence, uncomplicated: Secondary | ICD-10-CM | POA: Diagnosis not present

## 2017-09-19 DIAGNOSIS — L03116 Cellulitis of left lower limb: Principal | ICD-10-CM | POA: Diagnosis present

## 2017-09-19 DIAGNOSIS — I129 Hypertensive chronic kidney disease with stage 1 through stage 4 chronic kidney disease, or unspecified chronic kidney disease: Secondary | ICD-10-CM | POA: Diagnosis present

## 2017-09-19 DIAGNOSIS — N184 Chronic kidney disease, stage 4 (severe): Secondary | ICD-10-CM | POA: Diagnosis not present

## 2017-09-19 DIAGNOSIS — Z833 Family history of diabetes mellitus: Secondary | ICD-10-CM

## 2017-09-19 DIAGNOSIS — R609 Edema, unspecified: Secondary | ICD-10-CM

## 2017-09-19 DIAGNOSIS — D696 Thrombocytopenia, unspecified: Secondary | ICD-10-CM | POA: Diagnosis present

## 2017-09-19 DIAGNOSIS — Z8249 Family history of ischemic heart disease and other diseases of the circulatory system: Secondary | ICD-10-CM

## 2017-09-19 DIAGNOSIS — K7290 Hepatic failure, unspecified without coma: Secondary | ICD-10-CM | POA: Diagnosis present

## 2017-09-19 DIAGNOSIS — B1920 Unspecified viral hepatitis C without hepatic coma: Secondary | ICD-10-CM | POA: Diagnosis present

## 2017-09-19 DIAGNOSIS — Z66 Do not resuscitate: Secondary | ICD-10-CM | POA: Diagnosis present

## 2017-09-19 DIAGNOSIS — F1910 Other psychoactive substance abuse, uncomplicated: Secondary | ICD-10-CM | POA: Diagnosis present

## 2017-09-19 DIAGNOSIS — M79609 Pain in unspecified limb: Secondary | ICD-10-CM | POA: Diagnosis not present

## 2017-09-19 DIAGNOSIS — E876 Hypokalemia: Secondary | ICD-10-CM | POA: Diagnosis present

## 2017-09-19 DIAGNOSIS — D638 Anemia in other chronic diseases classified elsewhere: Secondary | ICD-10-CM | POA: Diagnosis present

## 2017-09-19 DIAGNOSIS — N5089 Other specified disorders of the male genital organs: Secondary | ICD-10-CM | POA: Diagnosis present

## 2017-09-19 DIAGNOSIS — K7031 Alcoholic cirrhosis of liver with ascites: Secondary | ICD-10-CM | POA: Diagnosis not present

## 2017-09-19 DIAGNOSIS — Z803 Family history of malignant neoplasm of breast: Secondary | ICD-10-CM

## 2017-09-19 DIAGNOSIS — D631 Anemia in chronic kidney disease: Secondary | ICD-10-CM | POA: Diagnosis present

## 2017-09-19 LAB — CBC WITH DIFFERENTIAL/PLATELET
BASOS ABS: 0 10*3/uL (ref 0.0–0.1)
Basophils Relative: 0 %
EOS ABS: 0 10*3/uL (ref 0.0–0.7)
Eosinophils Relative: 0 %
HCT: 34.8 % — ABNORMAL LOW (ref 39.0–52.0)
Hemoglobin: 11.6 g/dL — ABNORMAL LOW (ref 13.0–17.0)
LYMPHS PCT: 7 %
Lymphs Abs: 0.2 10*3/uL — ABNORMAL LOW (ref 0.7–4.0)
MCH: 28.5 pg (ref 26.0–34.0)
MCHC: 33.3 g/dL (ref 30.0–36.0)
MCV: 85.5 fL (ref 78.0–100.0)
MONO ABS: 0.1 10*3/uL (ref 0.1–1.0)
Monocytes Relative: 3 %
NEUTROS PCT: 90 %
Neutro Abs: 2.3 10*3/uL (ref 1.7–7.7)
PLATELETS: 102 10*3/uL — AB (ref 150–400)
RBC: 4.07 MIL/uL — ABNORMAL LOW (ref 4.22–5.81)
RDW: 28.2 % — ABNORMAL HIGH (ref 11.5–15.5)
WBC: 2.6 10*3/uL — AB (ref 4.0–10.5)

## 2017-09-19 LAB — COMPREHENSIVE METABOLIC PANEL
ALBUMIN: 2.4 g/dL — AB (ref 3.5–5.0)
ALK PHOS: 187 U/L — AB (ref 38–126)
ALT: 94 U/L — ABNORMAL HIGH (ref 17–63)
ANION GAP: 14 (ref 5–15)
AST: 150 U/L — ABNORMAL HIGH (ref 15–41)
BILIRUBIN TOTAL: 12.1 mg/dL — AB (ref 0.3–1.2)
BUN: 39 mg/dL — ABNORMAL HIGH (ref 6–20)
CALCIUM: 8.8 mg/dL — AB (ref 8.9–10.3)
CO2: 18 mmol/L — ABNORMAL LOW (ref 22–32)
Chloride: 99 mmol/L — ABNORMAL LOW (ref 101–111)
Creatinine, Ser: 2.07 mg/dL — ABNORMAL HIGH (ref 0.61–1.24)
GFR, EST AFRICAN AMERICAN: 38 mL/min — AB (ref >60)
GFR, EST NON AFRICAN AMERICAN: 33 mL/min — AB (ref >60)
GLUCOSE: 117 mg/dL — AB (ref 65–99)
Potassium: 4.4 mmol/L (ref 3.5–5.1)
Sodium: 131 mmol/L — ABNORMAL LOW (ref 135–145)
TOTAL PROTEIN: 6.7 g/dL (ref 6.5–8.1)

## 2017-09-19 LAB — PROTIME-INR
INR: 1.41
PROTHROMBIN TIME: 17.1 s — AB (ref 11.4–15.2)

## 2017-09-19 MED ORDER — VITAMIN D-3 25 MCG (1000 UT) PO CAPS: 1000.0000 [IU] | ORAL_CAPSULE | Freq: Every day | ORAL | Status: DC

## 2017-09-19 MED ORDER — SODIUM CHLORIDE 0.9 % IV SOLN
1.0000 g | INTRAVENOUS | Status: DC
  Administered 2017-09-19 – 2017-09-20 (×2): 1 g via INTRAVENOUS
  Filled 2017-09-19 (×2): qty 1
  Filled 2017-09-19: qty 10

## 2017-09-19 MED ORDER — PANTOPRAZOLE SODIUM 40 MG PO TBEC
40.0000 mg | DELAYED_RELEASE_TABLET | Freq: Two times a day (BID) | ORAL | Status: DC
  Administered 2017-09-19 – 2017-09-21 (×4): 40 mg via ORAL
  Filled 2017-09-19 (×4): qty 1

## 2017-09-19 MED ORDER — VITAMIN D 1000 UNITS PO TABS
1000.0000 [IU] | ORAL_TABLET | Freq: Every day | ORAL | Status: DC
  Administered 2017-09-19 – 2017-09-21 (×3): 1000 [IU] via ORAL
  Filled 2017-09-19 (×3): qty 1

## 2017-09-19 MED ORDER — TRIAMTERENE-HCTZ 37.5-25 MG PO TABS
1.0000 | ORAL_TABLET | Freq: Every day | ORAL | Status: DC
  Filled 2017-09-19: qty 1

## 2017-09-19 MED ORDER — LACTULOSE 10 GM/15ML PO SOLN
30.0000 g | Freq: Four times a day (QID) | ORAL | Status: DC
  Administered 2017-09-19 – 2017-09-21 (×8): 30 g via ORAL
  Filled 2017-09-19 (×9): qty 45

## 2017-09-19 MED ORDER — VANCOMYCIN HCL IN DEXTROSE 750-5 MG/150ML-% IV SOLN
750.0000 mg | INTRAVENOUS | Status: DC
  Filled 2017-09-19: qty 150

## 2017-09-19 MED ORDER — VANCOMYCIN HCL IN DEXTROSE 1-5 GM/200ML-% IV SOLN
1000.0000 mg | Freq: Once | INTRAVENOUS | Status: AC
  Administered 2017-09-19: 1000 mg via INTRAVENOUS
  Filled 2017-09-19: qty 200

## 2017-09-19 MED ORDER — FENTANYL CITRATE (PF) 100 MCG/2ML IJ SOLN
50.0000 ug | Freq: Once | INTRAMUSCULAR | Status: AC
  Administered 2017-09-19: 50 ug via INTRAMUSCULAR

## 2017-09-19 MED ORDER — ONDANSETRON HCL 4 MG PO TABS: 4.0000 mg | ORAL_TABLET | Freq: Four times a day (QID) | ORAL | Status: DC | PRN

## 2017-09-19 MED ORDER — SODIUM CHLORIDE 0.9% FLUSH
3.0000 mL | Freq: Two times a day (BID) | INTRAVENOUS | Status: DC
  Administered 2017-09-19 – 2017-09-20 (×3): 3 mL via INTRAVENOUS

## 2017-09-19 MED ORDER — SODIUM CHLORIDE 0.9% FLUSH: 3.0000 mL | INTRAVENOUS | Status: DC | PRN

## 2017-09-19 MED ORDER — SODIUM CHLORIDE 0.9 % IV SOLN: 250.0000 mL | INTRAVENOUS | Status: DC | PRN

## 2017-09-19 MED ORDER — RIFAXIMIN 550 MG PO TABS
550.0000 mg | ORAL_TABLET | Freq: Two times a day (BID) | ORAL | Status: DC
  Administered 2017-09-19 – 2017-09-21 (×4): 550 mg via ORAL
  Filled 2017-09-19 (×5): qty 1

## 2017-09-19 MED ORDER — TRIAMTERENE-HCTZ 37.5-25 MG PO TABS
1.0000 | ORAL_TABLET | Freq: Every day | ORAL | Status: DC
  Administered 2017-09-20: 1 via ORAL
  Filled 2017-09-19: qty 1

## 2017-09-19 MED ORDER — SPIRONOLACTONE 50 MG PO TABS
50.0000 mg | ORAL_TABLET | Freq: Every day | ORAL | Status: DC
  Filled 2017-09-19: qty 1

## 2017-09-19 MED ORDER — SPIRONOLACTONE 25 MG PO TABS
50.0000 mg | ORAL_TABLET | Freq: Every day | ORAL | Status: DC
  Administered 2017-09-19 – 2017-09-20 (×2): 50 mg via ORAL
  Filled 2017-09-19 (×2): qty 2

## 2017-09-19 MED ORDER — FENTANYL CITRATE (PF) 100 MCG/2ML IJ SOLN
50.0000 ug | Freq: Once | INTRAMUSCULAR | Status: DC
  Filled 2017-09-19: qty 2

## 2017-09-19 MED ORDER — FUROSEMIDE 40 MG PO TABS
40.0000 mg | ORAL_TABLET | Freq: Every day | ORAL | Status: DC
  Administered 2017-09-19 – 2017-09-21 (×3): 40 mg via ORAL
  Filled 2017-09-19 (×3): qty 1

## 2017-09-19 NOTE — Progress Notes (Signed)
Left lower extremity venous duplex has been completed. Negative for obvious evidence of DVT. Results were given to Dr. Alvino Chapel.  09/19/17 11:01 AM Jose [name] RVT

## 2017-09-19 NOTE — ED Notes (Signed)
Bed: WA21 Expected date:  Expected time:  Means of arrival:  Comments: Left leg and foot pain

## 2017-09-19 NOTE — ED Notes (Signed)
2 RN attempt x4 total

## 2017-09-19 NOTE — Progress Notes (Signed)
Pharmacy Antibiotic Note  Jose [name] is a 62 y.o. male admitted on 09/19/2017 with cellulitis.  Pharmacy has been consulted for Vancomycin dosing. Rocephin per MD  Plan: Vancomycin 1gm x1 in ED, then 750mg   IV every q24 hours.  Goal trough 10-15 mcg/mL.  Rocephin 1gm q24    Temp (24hrs), Avg:98.5 F (36.9 C), Min:98.5 F (36.9 C), Max:98.5 F (36.9 C)  Recent Labs  Lab 09/19/17 0854  WBC 2.6*  CREATININE 2.07*    Estimated Creatinine Clearance: 34.8 mL/min (A) (by C-G formula based on SCr of 2.07 mg/dL (H)).    No Known Allergies  Antimicrobials this admission: 5/25 Vancomycin >>  5/25 Rocephin >>   Dose adjustments this admission:  Microbiology results: None ordered  Thank you for allowing pharmacy to be a part of this patient's care.  Minda Ditto PharmD Pager [phone] 09/19/2017, 3:29 PM

## 2017-09-19 NOTE — ED Notes (Signed)
ED TO INPATIENT HANDOFF REPORT  Name/Age/Gender Jose [name] 62 y.o. male  Code Status    Code Status Orders  (From admission, onward)        Start     Ordered   09/19/17 1342  Full code  Continuous     09/19/17 1342    Code Status History    Date Active Date Inactive Code Status Order ID Comments User Context   09/10/2017 1542 09/12/2017 1914 Full Code [id number]  Lady Deutscher, MD ED   08/22/2017 1719 08/28/2017 1923 Full Code [id number]  Kerney Elbe, DO Inpatient   08/04/2017 1600 08/08/2017 1651 Full Code [id number]  [name], MD ED   07/12/2017 1234 07/15/2017 1615 Full Code [id number]  Hosie Poisson, MD Inpatient   08/14/2016 1547 08/18/2016 1903 Full Code [id number]  Samella Parr, NP Inpatient   07/30/2016 1918 08/06/2016 2021 Full Code [id number]  Domenic Polite, MD Inpatient   07/15/2016 2009 07/18/2016 1740 Full Code [id number]  Etta Quill, DO ED   06/06/2016 1549 06/10/2016 2056 Full Code [id number]  Edwin Dada, MD Inpatient   05/10/2016 2308 05/11/2016 2136 Full Code [id number]  Ivor Costa, MD ED   04/26/2016 1228 04/28/2016 1638 Full Code [id number]  Cristal Ford, DO ED   09/04/2015 0111 09/07/2015 1645 Full Code [id number]  Rise Patience, MD Inpatient   07/23/2015 2253 07/29/2015 1607 Full Code [id number]  Vianne Bulls, MD ED   06/26/2015 2349 06/29/2015 1815 Full Code [id number]  Theressa Millard, MD Inpatient   04/26/2013 0628 04/29/2013 1847 Full Code [id number]  Bynum Bellows, MD ED      Home/SNF/Other Home  Chief Complaint Leg Pain  Level of Care/Admitting Diagnosis ED Disposition    ED Disposition Condition [facility] Area: [facility] [[id number]]  Level of Care: Med-Surg [16]  Diagnosis: Cellulitis [242683]  Admitting Physician: Phillips Grout [[id number]]  Attending Physician: Derrill Kay A [[id number]]  PT Class (Do Not Modify): Observation [104]  PT Acc Code (Do Not Modify): Observation [10022]        Medical History Past Medical History:  Diagnosis Date  . Alcohol dependence ([place])   . Ascites   . Cancer ([place])   . Cirrhosis ([place])   . Depression   . 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 Hepatitis C    Hepatitis C  . HOH (hard of hearing)   . Hypertension   . Inguinal hernia    right  . QT prolongation   . Shortness of breath    with exertion  . Thrombocytopenia ([place])     Allergies No Known Allergies  IV Location/Drains/Wounds Patient Lines/Drains/Airways Status   Active Line/Drains/Airways    Name:   Placement date:   Placement time:   Site:   Days:   Peripheral IV 09/19/17 Right;Posterior Forearm   09/19/17    0920    Forearm   less than 1   Rectal Tube/Pouch   09/10/17    -    -   9          Labs/Imaging Results for orders placed or performed during the hospital encounter of 09/19/17 (from the past 48 hour(s))  Comprehensive metabolic panel     Status: Abnormal   Collection Time: 09/19/17  8:54 AM  Result Value Ref Range   Sodium 131 (L) 135 - 145 mmol/L  Potassium 4.4 3.5 - 5.1 mmol/L   Chloride 99 (L) 101 - 111 mmol/L   CO2 18 (L) 22 - 32 mmol/L   Glucose, Bld 117 (H) 65 - 99 mg/dL   BUN 39 (H) 6 - 20 mg/dL   Creatinine, Ser 2.07 (H) 0.61 - 1.24 mg/dL   Calcium 8.8 (L) 8.9 - 10.3 mg/dL   Total Protein 6.7 6.5 - 8.1 g/dL   Albumin 2.4 (L) 3.5 - 5.0 g/dL   AST 150 (H) 15 - 41 U/L   ALT 94 (H) 17 - 63 U/L   Alkaline Phosphatase 187 (H) 38 - 126 U/L   Total Bilirubin 12.1 (H) 0.3 - 1.2 mg/dL   GFR calc non Af Amer 33 (L) >60 mL/min   GFR calc Af Amer 38 (L) >60 mL/min    Comment: (NOTE) The eGFR has been calculated using the CKD EPI equation. This calculation has not been validated in all clinical situations. eGFR's persistently <60 mL/min signify possible Chronic Kidney Disease.    Anion gap 14 5 - 15    Comment: Performed at [facility], [place] [address], [place], [place]  [address]  Protime-INR     Status: Abnormal   Collection Time: 09/19/17  8:54 AM  Result Value Ref Range   Prothrombin Time 17.1 (H) 11.4 - 15.2 seconds   INR 1.41     Comment: Performed at [facility], [place] [address], [place], [place] [address]  CBC with Differential     Status: Abnormal   Collection Time: 09/19/17  8:54 AM  Result Value Ref Range   WBC 2.6 (L) 4.0 - 10.5 K/uL   RBC 4.07 (L) 4.22 - 5.81 MIL/uL   Hemoglobin 11.6 (L) 13.0 - 17.0 g/dL   HCT 34.8 (L) 39.0 - 52.0 %   MCV 85.5 78.0 - 100.0 fL   MCH 28.5 26.0 - 34.0 pg   MCHC 33.3 30.0 - 36.0 g/dL   RDW 28.2 (H) 11.5 - 15.5 %   Platelets 102 (L) 150 - 400 K/uL    Comment: SPECIMEN CHECKED FOR CLOTS REPEATED TO VERIFY PLATELET COUNT CONFIRMED BY SMEAR    Neutrophils Relative % 90 %   Lymphocytes Relative 7 %   Monocytes Relative 3 %   Eosinophils Relative 0 %   Basophils Relative 0 %   Neutro Abs 2.3 1.7 - 7.7 K/uL   Lymphs Abs 0.2 (L) 0.7 - 4.0 K/uL   Monocytes Absolute 0.1 0.1 - 1.0 K/uL   Eosinophils Absolute 0.0 0.0 - 0.7 K/uL   Basophils Absolute 0.0 0.0 - 0.1 K/uL   RBC Morphology MARKED POIKILOCYTOSIS     Comment: BURR CELLS TARGET CELLS Schistocytes present Performed at [facility], [place] [address] [place], [place] [address]    No results found.  Pending Labs Unresulted Labs (From admission, onward)   Start     Ordered   09/20/17 9485  Basic metabolic panel  Tomorrow morning,   R     09/19/17 1342   09/20/17 0500  CBC  Tomorrow morning,   R     09/19/17 1342      Vitals/Pain Today's Vitals   09/19/17 1030 09/19/17 1100 09/19/17 1107 09/19/17 1318  BP: 119/86 (!) 115/91  127/90  Pulse: 68 72  60  Resp:  14  (!) 24  Temp:      TempSrc:      SpO2: 100% 100%  100%  PainSc:   Asleep  Isolation Precautions No active isolations  Medications Medications  vancomycin (VANCOCIN) IVPB 1000 mg/200 mL premix (1,000 mg Intravenous New Bag/Given 09/19/17  1322)  Vitamin D-3 CAPS 1,000 Units (has no administration in time range)  furosemide (LASIX) tablet 40 mg (has no administration in time range)  lactulose (CHRONULAC) 10 GM/15ML solution 30 g (has no administration in time range)  ondansetron (ZOFRAN) tablet 4 mg (has no administration in time range)  pantoprazole (PROTONIX) EC tablet 40 mg (has no administration in time range)  rifaximin (XIFAXAN) tablet 550 mg (has no administration in time range)  spironolactone (ALDACTONE) tablet 50 mg (has no administration in time range)  triamterene-hydrochlorothiazide (MAXZIDE-25) 37.5-25 MG per tablet 1 tablet (has no administration in time range)  sodium chloride flush (NS) 0.9 % injection 3 mL (has no administration in time range)  sodium chloride flush (NS) 0.9 % injection 3 mL (has no administration in time range)  0.9 %  sodium chloride infusion (has no administration in time range)  cefTRIAXone (ROCEPHIN) 1 g in sodium chloride 0.9 % 100 mL IVPB (has no administration in time range)  fentaNYL (SUBLIMAZE) injection 50 mcg (50 mcg Intramuscular Given 09/19/17 0902)    Mobility walks with person assist

## 2017-09-19 NOTE — Progress Notes (Signed)
Pharmacy Note:  Initial antibiotic(s) regimen of Vancomycin ordered by EDP to treat cellulitis.  Estimated Creatinine Clearance: 34.8 mL/min (A) (by C-G formula based on SCr of 2.07 mg/dL (H)).  No current weight in system  Prev admits: 08/22/17 for AKI, LE edema                      09/10/17 for hepatic encephalopathy  No Known Allergies  Vitals:   09/19/17 1030 09/19/17 1100  BP: 119/86 (!) 115/91  Pulse: 68 72  Resp:  14  Temp:    SpO2: 100% 100%    Anti-infectives (From admission, onward)   Start     Dose/Rate Route Frequency Ordered Stop   09/19/17 1315  vancomycin (VANCOCIN) IVPB 1000 mg/200 mL premix     1,000 mg 200 mL/hr over 60 Minutes Intravenous  Once 09/19/17 1302        Antimicrobials this admission:  Vancomycin 1gm x1  Dose adjustments this admission:   Microbiology results:  None ordered at this time  Plan: Initial dose(s) of Vancomycin1gm X 1 ordered. F/U admission orders for further dosing if therapy continued. Suggest alternate antibiotic for cellulitis, hx CKD  Minda Ditto, [facility] 09/19/2017 1:04 PM

## 2017-09-19 NOTE — ED Triage Notes (Signed)
Pt comes to ed, via [facility], comes from home, pt c/o of left leg pain and edema pitting.  Pain on set yesterday, and continues for last 24 hrs. Pt has hx of HTN, and hernia.  V/s on 120/68, pluse 73, rr16, spo2 98 room air, cbg 127. Pain 9 out 10.  No recent injuries and alert x4

## 2017-09-19 NOTE — ED Provider Notes (Signed)
[facility] DEPT Provider Note   CSN: [id number] Arrival date & time: 09/19/17  0701     History   Chief Complaint Chief Complaint  Patient presents with  . Leg Pain    HPI Jose [name] is a 62 y.o. male.  HPI Patient with left thigh pain.  Began yesterday going to bed.  History of cirrhosis and hepatitis C.  States it hurts.  No fevers.  No trauma.  Has not had swelling like this before.  Has chronically swollen scrotum and abdomen.  States this is unchanged.  No difficulty breathing. Past Medical History:  Diagnosis Date  . Alcohol dependence ([place])   . Ascites   . Cancer ([place])   . Cirrhosis ([place])   . Depression   . 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 Hepatitis C    Hepatitis C  . HOH (hard of hearing)   . Hypertension   . Inguinal hernia    right  . QT prolongation   . Shortness of breath    with exertion  . Thrombocytopenia [facility])     Patient Active Problem List   Diagnosis Date Noted  . Hepatic encephalopathy ([place]) 09/10/2017  . CKD (chronic kidney disease), stage IV ([place]) 09/10/2017  . Bradycardia 09/10/2017  . Ascites due to alcoholic cirrhosis ([place])   . AKI (acute kidney injury) ([place]) 08/22/2017  . Scrotal edema 08/22/2017  . Palliative care by specialist   . Decompensation of cirrhosis of liver ([place])   . Anemia 08/05/2017  . Polysubstance abuse ([place]) 08/04/2017  . Upper GI bleed 08/04/2017  . Acute anemia 07/13/2017  . Symptomatic anemia 07/12/2017  . Hyponatremia 07/12/2017  . Smoker 01/08/2017  . Pancytopenia, acquired ([place]) 01/08/2017  . Pain in right leg 10/06/2016  . 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 Palliative care encounter   . Goals of care, counseling/discussion   . Liver mass, right lobe   . Acute kidney injury ([place]) 08/14/2016  . 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 Hepatitis C, chronic ([place]) 01/04/2016  . Right inguinal hernia 09/12/2015  . Essential  hypertension 09/12/2015  . Gastroesophageal reflux disease without esophagitis 09/12/2015  . Protein-calorie malnutrition, severe ([place]) 09/12/2015  . Anemia of chronic disease 09/12/2015  . Arthritis of left knee 09/12/2015  . Elevated LFTs   . Alcoholic cirrhosis of liver with ascites ([place]) 07/25/2015  . Macrocytic anemia 07/23/2015  . Alcoholism ([place]) 07/12/2015  . Hepatorenal syndrome ([place]) 06/27/2015  . Encephalopathy, hepatic ([place]) 06/26/2015  . Adenomatous polyps 10/18/2013  . Unspecified vitamin D deficiency 07/13/2013  . Atopic dermatitis 04/28/2013  . Anasarca 04/26/2013    Past Surgical History:  Procedure Laterality Date  . CIRCUMCISION    . COLONOSCOPY  march 2015   Dr. Renee Harder: nodular mucosa at appendiceal orifice, tubular adenoma, extremely poor prep  . COLONOSCOPY    . ESOPHAGOGASTRODUODENOSCOPY N/A 08/05/2017   Procedure: ESOPHAGOGASTRODUODENOSCOPY (EGD);  Surgeon: Jackquline Denmark, MD;  Location[facility] ENDOSCOPY;  Service: Endoscopy;  Laterality: N/A;  . ESOPHAGOGASTRODUODENOSCOPY (EGD) WITH PROPOFOL N/A 07/13/2017   Procedure: ESOPHAGOGASTRODUODENOSCOPY (EGD) WITH PROPOFOL;  Surgeon: Doran Stabler, MD;  Location: [facility] ENDOSCOPY;  Service: Gastroenterology;  Laterality: N/A;  . IR PARACENTESIS  08/04/2017  . IR PARACENTESIS  08/07/2017  . TOOTH EXTRACTION N/A 05/15/2017   Procedure: DENTAL RESTORATION and multiple teeth EXTRACTIONS;  Surgeon: Diona Browner, DDS;  Location: [facility];  Service: Oral Surgery;  Laterality: N/A;        Home Medications    Prior to Admission medications   Medication Sig Start Date End Date Taking? Authorizing Provider  Cholecalciferol (VITAMIN D-3) 1000 units CAPS Take 1,000 Units by mouth daily.    [provider]  fluticasone (FLONASE) 50 MCG/ACT nasal spray Place 1 spray into both nostrils daily as needed for allergies.     [provider]  furosemide (LASIX) 40 MG tablet Take 1 tablet (40 mg total) by mouth daily.  08/09/17   Georgette Shell, MD  hydroxypropyl methylcellulose / hypromellose (ISOPTO TEARS / GONIOVISC) 2.5 % ophthalmic solution Place 2 drops into both eyes 3 (three) times daily as needed for dry eyes.    [provider]  lactulose (CHRONULAC) 10 GM/15ML solution Take 45 mLs (30 g total) by mouth 4 (four) times daily. 08/28/17   Doreatha Lew, MD  Multiple Vitamin (TAB-A-VITE) TABS Take 1 tablet by mouth daily.    [provider]  ondansetron (ZOFRAN) 4 MG tablet Take 1 tablet (4 mg total) by mouth every 6 (six) hours as needed for nausea. 08/08/17   Georgette Shell, MD  pantoprazole (PROTONIX) 40 MG tablet Take 1 tablet (40 mg total) by mouth 2 (two) times daily. 08/08/17   Georgette Shell, MD  PROAIR HFA 108 [phone] Base) MCG/ACT inhaler Inhale 2 puffs into the lungs 4 (four) times daily as needed for shortness of breath or wheezing. 04/02/17   [provider]  rifaximin (XIFAXAN) 550 MG TABS tablet Take 1 tablet (550 mg total) by mouth 2 (two) times daily. 11/14/16   Doran Stabler, MD  spironolactone (ALDACTONE) 50 MG tablet Take 1 tablet (50 mg total) by mouth daily. 08/09/17   Georgette Shell, MD    Family History Family History  Problem Relation Age of Onset  . Breast cancer Other   . Diabetes Other   . Hypertension Other   . Breast cancer Mother   . Hypertension Mother   . Diabetes Father   . Hypertension Sister   . Heart disease Sister   . Hypertension Paternal Grandmother   . Colon cancer Neg Hx     Social History Social History   Tobacco Use  . Smoking status: Current Some Day Smoker    Packs/day: 0.12    Years: 47.00    Pack years: 5.64    Types: Cigarettes  . Smokeless tobacco: Never Used  Substance Use Topics  . Alcohol use: Yes    Comment: denies any in the last few weeks  . Drug use: No    Types: Marijuana, Cocaine    Comment: Last time for cocaine a few weeks ago ;Marijuana- intermittent     Allergies     Patient has no known allergies.   Review of Systems Review of Systems  Constitutional: Negative for appetite change.  HENT: Negative for congestion.   Respiratory: Negative for shortness of breath.   Cardiovascular: Positive for leg swelling. Negative for chest pain.  Gastrointestinal: Negative for abdominal pain.  Genitourinary: Positive for scrotal swelling.  Musculoskeletal: Negative for back pain.  Skin: Positive for wound.  Neurological: Negative for weakness.  Hematological: Negative for adenopathy.  Psychiatric/Behavioral: Negative for confusion.     Physical Exam Updated Vital Signs BP (!) 115/91   Pulse 72   Temp 98.5 F (36.9 C) (Oral)   Resp 14   SpO2 100%   Physical Exam  Constitutional: He appears well-developed.  HENT:  Head: Normocephalic.  Abrasion to left anterior forehead.  From previous fall per patient.  Eyes: Pupils are equal, round, and reactive to light.  Neck: Neck supple.  Cardiovascular: Normal rate.  Pulmonary/Chest: He has no wheezes. He has no rales.  Abdominal: He exhibits distension.  Musculoskeletal: He exhibits edema.  Edema with chronic venous changes on bilateral lower extremities, worse on left.  There is some erythema on the foot and on the proximal to mid medial thigh.  Dorsalis pedis pulse intact.  Neurological: He is alert.  Skin: Skin is warm. Capillary refill takes less than 2 seconds.     ED Treatments / Results  Labs (all labs ordered are listed, but only abnormal results are displayed) Labs Reviewed  COMPREHENSIVE METABOLIC PANEL - Abnormal; Notable for the following components:      Result Value   Sodium 131 (*)    Chloride 99 (*)    CO2 18 (*)    Glucose, Bld 117 (*)    BUN 39 (*)    Creatinine, Ser 2.07 (*)    Calcium 8.8 (*)    Albumin 2.4 (*)    AST 150 (*)    ALT 94 (*)    Alkaline Phosphatase 187 (*)    Total Bilirubin 12.1 (*)    GFR calc non Af Amer 33 (*)    GFR calc Af Amer 38 (*)    All other  components within normal limits  PROTIME-INR - Abnormal; Notable for the following components:   Prothrombin Time 17.1 (*)    All other components within normal limits  CBC WITH DIFFERENTIAL/PLATELET - Abnormal; Notable for the following components:   WBC 2.6 (*)    RBC 4.07 (*)    Hemoglobin 11.6 (*)    HCT 34.8 (*)    RDW 28.2 (*)    Platelets 102 (*)    Lymphs Abs 0.2 (*)    All other components within normal limits    EKG None  Radiology No results found.  Procedures Procedures (including critical care time)  Medications Ordered in ED Medications  fentaNYL (SUBLIMAZE) injection 50 mcg (50 mcg Intramuscular Given 09/19/17 0902)     Initial Impression / Assessment and Plan / ED Course  I have reviewed the triage vital signs and the nursing notes.  Pertinent labs & imaging results that were available during my care of the patient were reviewed by me and considered in my medical decision making (see chart for details).     Patient with redness and pain of left lower extremity.  Negative  venous Doppler.  Has good pulse.  Has mild neutropenia.  With comorbidities and likely cellulitis I feel patient would benefit from admission.  Does not have DVT.  Clinically is painful but doubt necrotizing fasciitis at this time.  Final Clinical Impressions(s) / ED Diagnoses   Final diagnoses:  Cellulitis of left lower extremity  Cirrhosis of liver with ascites, unspecified hepatic cirrhosis type [facility])    ED Discharge Orders    None       Davonna Belling, MD 09/19/17 1236

## 2017-09-19 NOTE — H&P (Signed)
History and Physical    Jose [name] QAS:[id number] DOB: 08-Dec-1955 DOA: 09/19/2017  PCP: [name], MD  Patient coming from: Home  Chief Complaint: Leg pain  HPI: Jose [name] is a 62 y.o. 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  This has not been done as they have not showed up for appointment to his house yet.  He is been having several days of left leg getting red and swollen.  He denies any nausea vomiting or diarrhea.  He denies any fevers.  He has been very depressed recently however with his end-stage liver problems he is been having to deal with.  Patient found to have cellulitis of his leg ultrasound is been done which has been preliminarily ruled out DVT.  He has been referred for admission for cellulitis.  Review of Systems: As per HPI otherwise 10 point review of systems negative.   Past Medical History:  Diagnosis Date  . Alcohol dependence ([place])   . Ascites   . Cancer ([place])   . Cirrhosis ([place])   . Depression   . 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 Hepatitis C    Hepatitis C  . HOH (hard of hearing)   . Hypertension   . Inguinal hernia    right  . QT prolongation   . Shortness of breath    with exertion  . Thrombocytopenia ([place])     Past Surgical History:  Procedure Laterality Date  . CIRCUMCISION    . COLONOSCOPY  march 2015   Dr. Renee Harder: nodular mucosa at appendiceal orifice, tubular adenoma, extremely poor prep  . COLONOSCOPY    . ESOPHAGOGASTRODUODENOSCOPY N/A 08/05/2017   Procedure: ESOPHAGOGASTRODUODENOSCOPY (EGD);  Surgeon: Jackquline Denmark, MD;  Location: [facility] ENDOSCOPY;  Service: Endoscopy;  Laterality: N/A;  . ESOPHAGOGASTRODUODENOSCOPY  (EGD) WITH PROPOFOL N/A 07/13/2017   Procedure: ESOPHAGOGASTRODUODENOSCOPY (EGD) WITH PROPOFOL;  Surgeon: Doran Stabler, MD;  Location: [facility] ENDOSCOPY;  Service: Gastroenterology;  Laterality: N/A;  . IR PARACENTESIS  08/04/2017  . IR PARACENTESIS  08/07/2017  . TOOTH EXTRACTION N/A 05/15/2017   Procedure: DENTAL RESTORATION and multiple teeth EXTRACTIONS;  Surgeon: Diona Browner, DDS;  Location: [place];  Service: Oral Surgery;  Laterality: N/A;     reports that he has been smoking cigarettes.  He has a 5.64 pack-year smoking history. He has never used smokeless tobacco. He reports that he drinks alcohol. He reports that he does not use drugs.  No Known Allergies  Family History  Problem Relation Age of Onset  . Breast cancer Other   . Diabetes Other   . Hypertension Other   . Breast cancer Mother   . Hypertension Mother   . Diabetes Father   . Hypertension Sister   . Heart disease Sister   . Hypertension Paternal Grandmother   . Colon cancer Neg Hx     Prior to Admission medications   Medication Sig Start Date End Date Taking? Authorizing Provider  Cholecalciferol (VITAMIN D-3) 1000 units CAPS Take 1,000 Units by mouth daily.   Yes [provider]  furosemide (LASIX) 40 MG tablet Take 1 tablet (40 mg total) by mouth daily. 08/09/17  Yes Georgette Shell, MD  lactulose (CHRONULAC) 10 GM/15ML solution Take 45 mLs (30 g total) by mouth 4 (four) times daily. 08/28/17  Yes Patrecia Pour, Christean Grief, MD  Multiple Vitamin (TAB-A-VITE) TABS Take 1 tablet by mouth daily.   Yes [provider]  ondansetron (ZOFRAN) 4 MG tablet Take 1 tablet (4 mg total) by mouth every 6 (six) hours as needed for nausea. 08/08/17  Yes Georgette Shell, MD  pantoprazole (PROTONIX) 40 MG tablet Take 1 tablet (40 mg total) by mouth 2 (two) times daily. 08/08/17  Yes Georgette Shell, MD  rifaximin (XIFAXAN) 550 MG TABS tablet Take 1 tablet (550 mg total) by mouth 2 (two) times daily. 11/14/16  Yes  Danis, Kirke Corin, MD  spironolactone (ALDACTONE) 50 MG tablet Take 1 tablet (50 mg total) by mouth daily. 08/09/17  Yes Georgette Shell, MD  triamterene-hydrochlorothiazide (MAXZIDE-25) 37.5-25 MG tablet Take 1 tablet by mouth daily.   Yes [provider]    Physical Exam: Vitals:   10/16/2017 1000 16-Oct-2017 1030 2017-10-16 1100 10/16/2017 1318  BP: 109/84 119/86 (!) 115/91 127/90  Pulse: 76 68 72 60  Resp:   14 (!) 24  Temp:      TempSrc:      SpO2: 100% 100% 100% 100%      Constitutional: NAD, calm, comfortable cachectic and chronically ill-appearing with anasarca Vitals:   2017/10/16 1000 10-16-17 1030 10/16/17 1100 2017-10-16 1318  BP: 109/84 119/86 (!) 115/91 127/90  Pulse: 76 68 72 60  Resp:   14 (!) 24  Temp:      TempSrc:      SpO2: 100% 100% 100% 100%   Eyes: PERRL, lids and conjunctivae normal ENMT: Mucous membranes are moist. Posterior pharynx clear of any exudate or lesions.Normal dentition.  Neck: normal, supple, no masses, no thyromegaly Respiratory: clear to auscultation bilaterally, no wheezing, no crackles. Normal respiratory effort. No accessory muscle use.  Cardiovascular: Regular rate and rhythm, no murmurs / rubs / gallops. No extremity edema. 2+ pedal pulses. No carotid bruits.  Abdomen: no tenderness, no masses palpated. No hepatosplenomegaly. Bowel sounds positive.  Large amount of scrotal swelling in the left Musculoskeletal: no clubbing / cyanosis. No joint deformity upper and lower extremities. Good ROM, no contractures. Normal muscle tone.  Skin: no rashes, lesions, ulcers. No induration except left inner thigh with erythema some mild superficial cellulitis with no induration Neurologic: CN 2-12 grossly intact. Sensation intact, DTR normal. Strength 5/5 in all 4.  Psychiatric: Normal judgment and insight. Alert and oriented x 3. Normal mood.    Labs on Admission: I have personally reviewed following labs and imaging studies  CBC: Recent Labs    Lab 2017-10-16 0854  WBC 2.6*  NEUTROABS 2.3  HGB 11.6*  HCT 34.8*  MCV 85.5  PLT 209*   Basic Metabolic Panel: Recent Labs  Lab 10/16/2017 0854  NA 131*  K 4.4  CL 99*  CO2 18*  GLUCOSE 117*  BUN 39*  CREATININE 2.07*  CALCIUM 8.8*   GFR: Estimated Creatinine Clearance: 34.8 mL/min (A) (by C-G formula based on SCr of 2.07 mg/dL (H)). Liver Function Tests: Recent Labs  Lab 10-16-17 0854  AST 150*  ALT 94*  ALKPHOS 187*  BILITOT 12.1*  PROT 6.7  ALBUMIN 2.4*   No results for input(s): LIPASE, AMYLASE in the last 168 hours. No results for input(s): AMMONIA in the last 168 hours. Coagulation Profile: Recent Labs  Lab Oct 16, 2017 0854  INR 1.41   Cardiac Enzymes: No results  for input(s): CKTOTAL, CKMB, CKMBINDEX, TROPONINI in the last 168 hours. BNP (last 3 results) No results for input(s): PROBNP in the last 8760 hours. HbA1C: No results for input(s): HGBA1C in the last 72 hours. CBG: No results for input(s): GLUCAP in the last 168 hours. Lipid Profile: No results for input(s): CHOL, HDL, LDLCALC, TRIG, CHOLHDL, LDLDIRECT in the last 72 hours. Thyroid Function Tests: No results for input(s): TSH, T4TOTAL, FREET4, T3FREE, THYROIDAB in the last 72 hours. Anemia Panel: No results for input(s): VITAMINB12, FOLATE, FERRITIN, TIBC, IRON, RETICCTPCT in the last 72 hours. Urine analysis:    Component Value Date/Time   COLORURINE YELLOW 08/22/2017 1401   APPEARANCEUR HAZY (A) 08/22/2017 1401   LABSPEC 1.008 08/22/2017 1401   PHURINE 5.0 08/22/2017 1401   GLUCOSEU NEGATIVE 08/22/2017 1401   HGBUR NEGATIVE 08/22/2017 1401   BILIRUBINUR NEGATIVE 08/22/2017 1401   KETONESUR NEGATIVE 08/22/2017 1401   PROTEINUR NEGATIVE 08/22/2017 1401   UROBILINOGEN 4.0 (H) 04/26/2013 0500   NITRITE NEGATIVE 08/22/2017 1401   LEUKOCYTESUR NEGATIVE 08/22/2017 1401   Sepsis Labs: !!!!!!!!!!!!!!!!!!!!!!!!!!!!!!!!!!!!!!!!!!!! @LABRCNTIP (procalcitonin:4,lacticidven:4) ) Recent  Results (from the past 240 hour(s))  Blood culture (routine x 2)     Status: None   Collection Time: 09/10/17 12:22 PM  Result Value Ref Range Status   Specimen Description BLOOD BLOOD LEFT WRIST  Final   Special Requests   Final    BOTTLES DRAWN AEROBIC ONLY Blood Culture results may not be optimal due to an inadequate volume of blood received in culture bottles   Culture   Final    NO GROWTH 5 DAYS Performed at [facility], [place] [address], [place], [place] [address]    Report Status 09/15/2017 FINAL  Final  Blood culture (routine x 2)     Status: None   Collection Time: 09/10/17 12:22 PM  Result Value Ref Range Status   Specimen Description BLOOD LEFT HAND  Final   Special Requests   Final    BOTTLES DRAWN AEROBIC AND ANAEROBIC Blood Culture results may not be optimal due to an inadequate volume of blood received in culture bottles   Culture   Final    NO GROWTH 5 DAYS Performed at [facility], [place] [address], [place], [place] [address]    Report Status 09/15/2017 FINAL  Final  MRSA PCR Screening     Status: None   Collection Time: 09/10/17  6:58 PM  Result Value Ref Range Status   MRSA by PCR NEGATIVE NEGATIVE Final    Comment:        The GeneXpert MRSA Assay (FDA approved for NASAL specimens only), is one component of a comprehensive MRSA colonization surveillance program. It is not intended to diagnose MRSA infection nor to guide or monitor treatment for MRSA infections. Performed at [facility], [place] [address] [place],  [address]      Radiological Exams on Admission: No results found.  Old chart reviewed  Case discussed with EDP Dr. Alvino Chapel  Assessment/Plan 62 year old male with end-stage liver disease due to alcohol abuse comes in with lower extremity cellulitis Principal Problem:   Cellulitis-placed on IV vancomycin and Rocephin.  Patient has not been on any antibiotics as an outpatient recently but due to his very malnourished  state he probably will benefit for IV antibiotics for 24 hours or so.  No induration or fluctuance noted.  Preliminary ultrasound is pending of his leg and was negative for DVT.  Active Problems:   Anasarca-noted  continue his home diuretics   Alcoholism (HCC)-patient reports last drink was 2 days ago unsure how reliable this is   Alcoholic cirrhosis of liver with ascites (HCC)-end-stage   Anemia of chronic disease-stable   Polysubstance abuse (HCC)-noted   CKD (chronic kidney disease), stage IV (HCC)-noted   chronic Scrotal swelling-patient reports this is same as it usually is   Patient was set up with palliative care/hospice for home on discharge from 09/12/2017 patient and family are reporting that they have not evaluated him at home yet.  May benefit from calling them again to set this up.  Consult case management.  DVT prophylaxis: SCDs Code Status: Full code Family Communication: Sister Disposition Plan: Per day team Consults called: None Admission status: Observation   Jose [name] A MD [facility]  If 7PM-7AM, please contact night-coverage [email or web address] Password [email or web address]  09/19/2017, 1:40 PM

## 2017-09-20 ENCOUNTER — Observation Stay (HOSPITAL_COMMUNITY): Payer: Medicaid Other

## 2017-09-20 DIAGNOSIS — Z515 Encounter for palliative care: Secondary | ICD-10-CM | POA: Diagnosis not present

## 2017-09-20 DIAGNOSIS — D638 Anemia in other chronic diseases classified elsewhere: Secondary | ICD-10-CM

## 2017-09-20 DIAGNOSIS — F1910 Other psychoactive substance abuse, uncomplicated: Secondary | ICD-10-CM

## 2017-09-20 DIAGNOSIS — Z7189 Other specified counseling: Secondary | ICD-10-CM

## 2017-09-20 DIAGNOSIS — K7290 Hepatic failure, unspecified without coma: Secondary | ICD-10-CM

## 2017-09-20 DIAGNOSIS — N5089 Other specified disorders of the male genital organs: Secondary | ICD-10-CM

## 2017-09-20 DIAGNOSIS — R601 Generalized edema: Secondary | ICD-10-CM | POA: Diagnosis not present

## 2017-09-20 DIAGNOSIS — L03116 Cellulitis of left lower limb: Secondary | ICD-10-CM | POA: Diagnosis not present

## 2017-09-20 DIAGNOSIS — K7031 Alcoholic cirrhosis of liver with ascites: Secondary | ICD-10-CM

## 2017-09-20 DIAGNOSIS — N184 Chronic kidney disease, stage 4 (severe): Secondary | ICD-10-CM | POA: Diagnosis not present

## 2017-09-20 LAB — BODY FLUID CELL COUNT WITH DIFFERENTIAL
EOS FL: 0 %
Lymphs, Fluid: 10 %
Monocyte-Macrophage-Serous Fluid: 70 % (ref 50–90)
NEUTROPHIL FLUID: 20 % (ref 0–25)
WBC FLUID: 26 uL (ref 0–1000)

## 2017-09-20 LAB — BASIC METABOLIC PANEL
ANION GAP: 14 (ref 5–15)
BUN: 41 mg/dL — ABNORMAL HIGH (ref 6–20)
CALCIUM: 8.4 mg/dL — AB (ref 8.9–10.3)
CO2: 15 mmol/L — ABNORMAL LOW (ref 22–32)
Chloride: 101 mmol/L (ref 101–111)
Creatinine, Ser: 2.25 mg/dL — ABNORMAL HIGH (ref 0.61–1.24)
GFR, EST AFRICAN AMERICAN: 34 mL/min — AB (ref >60)
GFR, EST NON AFRICAN AMERICAN: 30 mL/min — AB (ref >60)
Glucose, Bld: 210 mg/dL — ABNORMAL HIGH (ref 65–99)
Potassium: 4.3 mmol/L (ref 3.5–5.1)
SODIUM: 130 mmol/L — AB (ref 135–145)

## 2017-09-20 LAB — GRAM STAIN

## 2017-09-20 LAB — PROTEIN, PLEURAL OR PERITONEAL FLUID

## 2017-09-20 LAB — CBC
HCT: 31 % — ABNORMAL LOW (ref 39.0–52.0)
HEMOGLOBIN: 9.9 g/dL — AB (ref 13.0–17.0)
MCH: 28.3 pg (ref 26.0–34.0)
MCHC: 31.9 g/dL (ref 30.0–36.0)
MCV: 88.6 fL (ref 78.0–100.0)
Platelets: 74 10*3/uL — ABNORMAL LOW (ref 150–400)
RBC: 3.5 MIL/uL — AB (ref 4.22–5.81)
RDW: 28.8 % — ABNORMAL HIGH (ref 11.5–15.5)
WBC: 4.5 10*3/uL (ref 4.0–10.5)

## 2017-09-20 LAB — ALBUMIN, PLEURAL OR PERITONEAL FLUID: Albumin, Fluid: <1 g/dL

## 2017-09-20 MED ORDER — VANCOMYCIN HCL IN DEXTROSE 750-5 MG/150ML-% IV SOLN
750.0000 mg | Freq: Every day | INTRAVENOUS | Status: DC
  Administered 2017-09-20: 750 mg via INTRAVENOUS
  Filled 2017-09-20 (×2): qty 150

## 2017-09-20 MED ORDER — LIDOCAINE HCL 1 % IJ SOLN
INTRAMUSCULAR | Status: AC
  Filled 2017-09-20: qty 20

## 2017-09-20 MED ORDER — SPIRONOLACTONE 25 MG PO TABS
50.0000 mg | ORAL_TABLET | Freq: Two times a day (BID) | ORAL | Status: DC
  Administered 2017-09-20 – 2017-09-21 (×2): 50 mg via ORAL
  Filled 2017-09-20 (×2): qty 2

## 2017-09-20 NOTE — Procedures (Signed)
PROCEDURE SUMMARY:  Successful US guided paracentesis from left lateral abdomen.  Yielded 8.7 liters of clear, yellow fluid.  No immediate complications.  Pt tolerated well.   Specimen was sent for labs.  Docia Barrier PA-C 09/20/2017 3:51 PM

## 2017-09-20 NOTE — Care Management Note (Signed)
Case Management Note  Patient Details  Name: Jose [name] MRN: [id number] Date of Birth: [birth year]  Subjective/Objective:      Pt presented for LLE cellulitis after multiple recent hospitalizations related to end-stage liver disease.  Pt from home alone.  Pt has walker but no other DME.  Pt has arranged a personal care service to provide care 2 hours/day. This service was set to begin yesterday but did not due to patient's hospitalization.    Pt and sister, Jose [name], discussed home hospice care with Palliative Care and want meet with home hospice providers.            Action/Plan: Choice presented to Jose [name], patient's sister, over the phone.  Jose [name] has no preference and agrees to [facility].  Referral called to [facility] with [facility], who will arrange contact with patient tomorrow.  Expected Discharge Date:                  Expected Discharge Plan:  Home w Hospice Care  In-House Referral:  Hospice / Palliative Care  Discharge planning Services  CM Consult  Post Acute Care Choice:  Hospice Choice offered to:  Sibling(via phone to Jose [name], patient's sister)  DME Arranged:    DME Agency:     HH Arranged:    [place] Agency:     Status of Service:  In process, will continue to follow  If discussed at Long Length of Stay Meetings, dates discussed:    Additional Comments:  Claudie Leach, RN 09/20/2017, 6:13 PM

## 2017-09-20 NOTE — Consult Note (Signed)
Consultation Note Date: 09/20/2017   Patient Name: Jose [name]  DOB: [birth year]  MRN: [id number]  Age / Sex: 62 y.o., male  PCP: [name], MD Referring Physician: Aline August, MD  Reason for Consultation: Establishing goals of care and Psychosocial/spiritual support  HPI/Patient Profile: 62 y.o. male  with past medical history of polysubstance use disorder, alcohol abuse, thrombocytopenia, hemochromatosis, hepatitis C, new diagnosis of hepatocellular carcinoma, chronic kidney disease stage IV admitted on 09/19/2017 with cellultits.  Well known to palliative service as he was just seen by palliative medicine services in April, May 1, and May 18.  Consult ordered for ongoing goals of care discussion.  Clinical Assessment and Goals of Care: Patient seen, chart reviewed.    I introduced palliative care as specialized medical care for people living with serious illness. It focuses on providing relief from the symptoms and stress of a serious illness. The goal is to improve quality of life for both the patient and the family.  I met today with Jose [name].  He reports continuing to struggle with how ill he is but he does state that he has been told he has months to live.  He states that feeling as well as he can, his family, and being at home are the most important things to him.  We discussed his continued decline in nutrition, cognition, and functional status.  Without prompting, Jose [name] then stated that he thinks that he may want hospice services at home.  In the past, he had thought that hospice meant leaving his home and this has not been his desire.  We discussed what home hospice offers and he states that he thinks that this would benefit him.  We did discuss that hospice is not 24 hour in home care, and he reports that he understands this fact.  I also called and spoke with his sister, Jose [name].  I reviewed my  conversation with Vyron including his stated desire to talk to hospice.  She reports that this surprises her a little, but she is hopeful that he is serious as she feels this would be his best option moving forward.  She would like to be part of meeting with hospice agency.  Concept of Hospice and Palliative Care were discussed  Questions and concerns addressed.   PMT will continue to support holistically.   SUMMARY OF RECOMMENDATIONS   - Continue full scope of treatment including full code, but Jose [name] reports he continues to consider this and that he wants to consider electing his hospice benefits on discharge.   - Referral placed to case management to confirm choice and make referral as appropriate.  I do think that he would be best served by home hospice on discharge. - His sister, Jose [name] has been a great support for him and will continue to be vital to care planas Jose [name] becomes more ill.  She would like to be part of meeting with hospice. - Recommend patient be affiliated with a home health service if he declines hospice services.  Code Status/Advance Care Planning:  Full code- Jose [name] reports that he continues to consider this.  He was more open to conversation that he has been according to prior notes, however, he may be best served by continuing to address this once he transitions to his home environment.  His sister also reports that she thinks he may be more open to revisiting once he is in his own home.  This is certainly something that hospice will continue to address if he elects to enroll in hospice on discharge.  Palliative Prophylaxis:   Bowel Regimen, Delirium Protocol, Frequent Pain Assessment, Oral Care and Turn Reposition  Additional Recommendations (Limitations, Scope, Preferences):  Full Scope Treatment  Psycho-social/Spiritual:   Desire for further Chaplaincy support:no  Additional Recommendations: Education on Hospice and Referral to [employer]   Prognosis:  < 6 months in the setting of hepatocellular carcinoma, hepatic encephalopathy, chronic kidney disease stage IV.  He remains at high risk for acute decompensation.    Discharge Planning: To be determined.  He would like to discuss home with hospice with hospice liaison.      Primary Diagnoses: Present on Admission: . Alcoholic cirrhosis of liver with ascites ([place]) . Cellulitis . CKD (chronic kidney disease), stage IV ([place]) . Anemia of chronic disease . Alcoholism ([place]) . Anasarca . End stage liver disease ([place]) . Polysubstance abuse ([place]) . chronic Scrotal swelling   I have reviewed the medical record, interviewed the patient and family, and examined the patient. The following aspects are pertinent.  Past Medical History:  Diagnosis Date  . Alcohol dependence ([place])   . Ascites   . Cancer ([place])   . Cirrhosis ([place])   . Depression   . 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 Hepatitis C    Hepatitis C  . HOH (hard of hearing)   . Hypertension   . Inguinal hernia    right  . QT prolongation   . Shortness of breath    with exertion  . Thrombocytopenia ([place])    Social History   Socioeconomic History  . Marital status: Single    Spouse name: Not on file  . Number of children: Not on file  . Years of education: Not on file  . Highest education level: Not on file  Occupational History  . Not on file  Social Needs  . Financial resource strain: Not on file  . Food insecurity:    Worry: Not on file    Inability: Not on file  . Transportation needs:    Medical: Not on file    Non-medical: Not on file  Tobacco Use  . Smoking status: Current Some Day Smoker    Packs/day: 0.12    Years: 47.00    Pack years: 5.64    Types: Cigarettes  . Smokeless tobacco: Never Used  Substance and Sexual Activity  . Alcohol use: Yes    Comment: denies any in the last few weeks  . Drug use: No    Types: Marijuana,  Cocaine    Comment: Last time for cocaine a few weeks ago ;Marijuana- intermittent  . Sexual activity: Not Currently    Comment: MPOA sister and Doug senior  Lifestyle  . Physical activity:    Days per week: Not on file    Minutes per session: Not on file  . Stress: Not on file  Relationships  . Social connections:  Talks on phone: Not on file    Gets together: Not on file    Attends religious service: Not on file    Active member of club or organization: Not on file    Attends meetings of clubs or organizations: Not on file    Relationship status: Not on file  Other Topics Concern  . Not on file  Social History Narrative   ** Merged History Encounter **       Family History  Problem Relation Age of Onset  . Breast cancer Other   . Diabetes Other   . Hypertension Other   . Breast cancer Mother   . Hypertension Mother   . Diabetes Father   . Hypertension Sister   . Heart disease Sister   . Hypertension Paternal Grandmother   . Colon cancer Neg Hx    Scheduled Meds: . cholecalciferol  1,000 Units Oral Daily  . furosemide  40 mg Oral Daily  . lactulose  30 g Oral QID  . pantoprazole  40 mg Oral BID  . rifaximin  550 mg Oral BID  . sodium chloride flush  3 mL Intravenous Q12H  . spironolactone  50 mg Oral Daily  . triamterene-hydrochlorothiazide  1 tablet Oral Daily   Continuous Infusions: . sodium chloride    . cefTRIAXone (ROCEPHIN)  IV Stopped (09/19/17 1715)  . vancomycin     PRN Meds:.sodium chloride, ondansetron, sodium chloride flush Medications Prior to Admission:  Prior to Admission medications   Medication Sig Start Date End Date Taking? Authorizing Provider  Cholecalciferol (VITAMIN D-3) 1000 units CAPS Take 1,000 Units by mouth daily.   Yes [provider]  furosemide (LASIX) 40 MG tablet Take 1 tablet (40 mg total) by mouth daily. 08/09/17  Yes Georgette Shell, MD  hydroxypropyl methylcellulose / hypromellose (ISOPTO TEARS / GONIOVISC)  2.5 % ophthalmic solution Place 2 drops into both eyes 3 (three) times daily as needed for dry eyes.   Yes [provider]  lactulose (CHRONULAC) 10 GM/15ML solution Take 45 mLs (30 g total) by mouth 4 (four) times daily. 08/28/17  Yes Patrecia Pour, Christean Grief, MD  Multiple Vitamin (TAB-A-VITE) TABS Take 1 tablet by mouth daily.   Yes [provider]  pantoprazole (PROTONIX) 40 MG tablet Take 1 tablet (40 mg total) by mouth 2 (two) times daily. 08/08/17  Yes Georgette Shell, MD  PROAIR HFA 108 [phone] Base) MCG/ACT inhaler Inhale 2 puffs into the lungs 4 (four) times daily as needed for shortness of breath or wheezing. 04/02/17  Yes [provider]  rifaximin (XIFAXAN) 550 MG TABS tablet Take 1 tablet (550 mg total) by mouth 2 (two) times daily. 11/14/16  Yes Danis, Kirke Corin, MD  spironolactone (ALDACTONE) 50 MG tablet Take 1 tablet (50 mg total) by mouth daily. 08/09/17  Yes Georgette Shell, MD  fluticasone [facility]) 50 MCG/ACT nasal spray Place 1 spray into both nostrils daily as needed for allergies.     [provider]  ondansetron (ZOFRAN) 4 MG tablet Take 1 tablet (4 mg total) by mouth every 6 (six) hours as needed for nausea. 08/08/17   Georgette Shell, MD  oxyCODONE (OXY IR/ROXICODONE) 5 MG immediate release tablet Take 1 tablet (5 mg total) by mouth 2 (two) times daily. Patient not taking: Reported on 09/10/2017 08/08/17   Georgette Shell, MD   No Known Allergies Review of Systems  Constitutional: Positive for fatigue.  Neurological: Positive for weakness.  Psychiatric/Behavioral: Positive for  sleep disturbance.    Physical Exam  Constitutional:  Acutely ill appearing older man, no acute distress  HENT:  Head: Normocephalic and atraumatic.  Neck: Normal range of motion.  Cardiovascular: Normal rate.  Pulmonary/Chest: Effort normal.  Abdominal:  Ascites  Musculoskeletal:  Very weak  Neurological: He is alert.  Skin: Skin is warm and  dry.  Nursing note and vitals reviewed.   Vital Signs: BP 105/75 (BP Location: Left Arm)   Pulse (!) 57   Temp 97.8 F (36.6 C) (Oral)   Resp 14   Ht _0  (1.676 m)   Wt 79.3 kg (174 lb 13.2 oz)   SpO2 100%   BMI 28.22 kg/m  Pain Scale: 0-10   Pain Score: 0-No pain   SpO2: SpO2: 100 % O2 Device:SpO2: 100 % O2 Flow Rate: .   IO: Intake/output summary:   Intake/Output Summary (Last 24 hours) at 09/20/2017 1215 Last data filed at 09/20/2017 1000 Gross per 24 hour  Intake 870 ml  Output 0 ml  Net 870 ml    LBM: Last BM Date: 09/20/17 Baseline Weight: Weight: 79.3 kg (174 lb 13.2 oz) Most recent weight: Weight: 79.3 kg (174 lb 13.2 oz)     Palliative Assessment/Data:     Time In: 1110 Time Out: 1220 Time Total: 70 min Greater than 50%  of this time was spent counseling and coordinating care related to the above assessment and plan. Staffed with Dr. [place] Pink  Signed by: Micheline Rough, MD   Please contact Palliative Medicine Team phone at [phone] for questions and concerns.  For individual provider: See Shea Evans

## 2017-09-20 NOTE — Progress Notes (Signed)
Patient ID: Jose [name], male   DOB: [birth year], 62 y.o.   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  Ultrasound was negative for DVT and he was admitted on IV antibiotics for cellulitis.  Assessment & Plan:   Principal Problem:   Cellulitis Active Problems:   Anasarca   Alcoholism ([place])   Alcoholic cirrhosis of liver with ascites (HCC)   Cirrhosis of liver with ascites (HCC)   Anemia of chronic disease   End stage liver disease (HCC)   Polysubstance abuse (HCC)   CKD (chronic kidney disease), stage IV (HCC)   chronic Scrotal swelling   Left lower extremity cellulitis -Improving.  Currently on vancomycin and Rocephin.  Follow cultures.  Lower extremity ultrasound was negative for DVT.  No evidence of abscess at this time.  End-stage liver disease with cirrhosis of liver from alcohol abuse and hepatitis C with recurrent ascites/anasarca/scrotal swelling with history of hepatic encephalopathy -Overall prognosis is very poor.  Patient was being set up for outpatient palliative care/hospice for home on recent discharge.  Will reconsult palliative care -Continue Lasix.  Increase spironolactone to 50 mill grams twice a day. -Abdomen is very distended.  Will get ultrasound-guided paracentesis. -Patient  was recently evaluated by urology during recent prior admission and had recommended conservative treatment -Continue lactulose and rifaximin  Chronic kidney disease stage IV -Creatinine stable.  Monitor  Alcohol abuse/polysubstance abuse -Monitor for withdrawal -Social worker consult next  Chronic anemia and thrombocytopenia -Probably secondary to chronic kidney disease and alcohol abuse with cirrhosis -No signs of bleeding.  Monitor  Generalized deconditioning -Palliative care consult.  Overall prognosis is poor.  Recommend hospice.    DVT prophylaxis: SCDs Code Status: Full Family Communication: None at bedside Disposition Plan: Depends on clinical outcome  Consultants: Palliative care Procedures: None Antimicrobials: Vancomycin and Rocephin from 09/19/2017 onwards   Subjective: Patient seen and examined at bedside.  He is awake but is a poor historian.  Complains of abdominal swelling and some left leg pain.  No overnight fever or vomiting reported per nursing staff.  Objective: Vitals:   09/19/17 1437 09/19/17 2038 09/19/17 2130 09/20/17 0502  BP: (!) 154/86 (!) 136/93  105/75  Pulse: (!) 53 (!) 56  (!) 57  Resp: 16 14  14   Temp:  97.9 F (36.6 C)  97.8 F (36.6 C)  TempSrc:  Oral  Oral  SpO2: 99% 100%  100%  Weight:   79.3 kg (174 lb 13.2 oz)   Height:   5\' 6"  (1.676 m)     Intake/Output Summary (Last 24 hours) at 09/20/2017 1219 Last data filed at 09/20/2017 1000 Gross per 24 hour  Intake 870 ml  Output 0 ml  Net 870 ml   Filed Weights   09/19/17 2130  Weight: 79.3 kg (174 lb 13.2 oz)    Examination:  General exam: Appears older than stated age.  No acute distress  respiratory  system: Bilateral decreased breath sounds at bases Cardiovascular system: S1 & S2 heard, rate controlled  gastrointestinal system: Abdomen is distended, soft and nontender with midline umbilical hernia.  Bowel sounds present Central nervous system: Alert but poor historian. No  focal neurological deficits. Moving extremities Extremities: No cyanosis, clubbing; trace edema  Skin: Left inner thigh and calf is mildly tender and warm , no visible erythema Psychiatry: Poor insight Genitourinary: Massive scrotal swelling    Data Reviewed: I have personally reviewed following labs and imaging studies  CBC: Recent Labs  Lab 09/19/17 0854 09/20/17 0426  WBC 2.6* 4.5  NEUTROABS 2.3  --   HGB 11.6* 9.9*  HCT 34.8* 31.0*  MCV 85.5 88.6  PLT 102* 74*   Basic Metabolic Panel: Recent Labs  Lab 09/19/17 0854 09/20/17 0426  NA 131* 130*  K 4.4 4.3  CL 99* 101  CO2 18* 15*  GLUCOSE 117* 210*  BUN 39* 41*  CREATININE 2.07* 2.25*  CALCIUM 8.8* 8.4*   GFR: Estimated Creatinine Clearance: 34.1 mL/min (A) (by C-G formula based on SCr of 2.25 mg/dL (H)). Liver Function Tests: Recent Labs  Lab 09/19/17 0854  AST 150*  ALT 94*  ALKPHOS 187*  BILITOT 12.1*  PROT 6.7  ALBUMIN 2.4*   No results for input(s): LIPASE, AMYLASE in the last 168 hours. No results for input(s): AMMONIA in the last 168 hours. Coagulation Profile: Recent Labs  Lab 09/19/17 0854  INR 1.41   Cardiac Enzymes: No results for input(s): CKTOTAL, CKMB, CKMBINDEX, TROPONINI in the last 168 hours. BNP (last 3 results) No results for input(s): PROBNP in the last 8760 hours. HbA1C: No results for input(s): HGBA1C in the last 72 hours. CBG: No results for input(s): GLUCAP in the last 168 hours. Lipid Profile: No results for input(s): CHOL, HDL, LDLCALC, TRIG, CHOLHDL, LDLDIRECT in the last 72 hours. Thyroid Function Tests: No results for input(s): TSH, T4TOTAL, FREET4, T3FREE, THYROIDAB in the last 72 hours. Anemia Panel: No results for input(s): VITAMINB12, FOLATE, FERRITIN, TIBC, IRON, RETICCTPCT in the last 72 hours. Sepsis Labs: No results for input(s): PROCALCITON, LATICACIDVEN in the last 168 hours.  Recent Results (from the past 240 hour(s))  Blood culture (routine x 2)      Status: None   Collection Time: 09/10/17 12:22 PM  Result Value Ref Range Status   Specimen Description BLOOD BLOOD LEFT WRIST  Final   Special Requests   Final    BOTTLES DRAWN AEROBIC ONLY Blood Culture results may not be optimal due to an inadequate volume of blood received in culture bottles   Culture   Final    NO GROWTH 5 DAYS Performed at [facility], [place] [address] [place], [place] [address]    Report Status 09/15/2017 FINAL  Final  Blood culture (routine x 2)     Status: None   Collection Time: 09/10/17 12:22 PM  Result Value Ref Range Status   Specimen Description BLOOD LEFT HAND  Final   Special Requests   Final    BOTTLES DRAWN AEROBIC AND ANAEROBIC Blood Culture results may not be optimal due to an inadequate volume of blood received in culture bottles   Culture   Final    NO GROWTH 5 DAYS Performed at [facility], [place] [address] [place], [place] [address]    Report Status 09/15/2017 FINAL  Final  MRSA PCR Screening     Status: None   Collection Time: 09/10/17  6:58 PM  Result  Value Ref Range Status   MRSA by PCR NEGATIVE NEGATIVE Final    Comment:        The GeneXpert MRSA Assay (FDA approved for NASAL specimens only), is one component of a comprehensive MRSA colonization surveillance program. It is not intended to diagnose MRSA infection nor to guide or monitor treatment for MRSA infections. Performed at [facility], [place] [address] [place], [place] [address]          Radiology Studies: No results found.      Scheduled Meds: . cholecalciferol  1,000 Units Oral Daily  . furosemide  40 mg Oral Daily  . lactulose  30 g Oral QID  . pantoprazole  40 mg Oral BID  . rifaximin  550 mg Oral BID  . sodium chloride flush  3 mL Intravenous Q12H  . spironolactone  50 mg Oral Daily  . triamterene-hydrochlorothiazide  1 tablet Oral Daily   Continuous Infusions: . sodium chloride    . cefTRIAXone (ROCEPHIN)  IV Stopped (09/19/17  1715)  . vancomycin       LOS: 0 days        Aline August, MD [facility] Pager [phone]  If 7PM-7AM, please contact night-coverage [email or web address] Password [facility] 09/20/2017, 12:19 PM

## 2017-09-21 DIAGNOSIS — Z8249 Family history of ischemic heart disease and other diseases of the circulatory system: Secondary | ICD-10-CM | POA: Diagnosis not present

## 2017-09-21 DIAGNOSIS — R601 Generalized edema: Secondary | ICD-10-CM | POA: Diagnosis present

## 2017-09-21 DIAGNOSIS — N5089 Other specified disorders of the male genital organs: Secondary | ICD-10-CM | POA: Diagnosis present

## 2017-09-21 DIAGNOSIS — F1020 Alcohol dependence, uncomplicated: Secondary | ICD-10-CM | POA: Diagnosis present

## 2017-09-21 DIAGNOSIS — K7031 Alcoholic cirrhosis of liver with ascites: Secondary | ICD-10-CM | POA: Diagnosis present

## 2017-09-21 DIAGNOSIS — I129 Hypertensive chronic kidney disease with stage 1 through stage 4 chronic kidney disease, or unspecified chronic kidney disease: Secondary | ICD-10-CM | POA: Diagnosis present

## 2017-09-21 DIAGNOSIS — L03116 Cellulitis of left lower limb: Secondary | ICD-10-CM | POA: Diagnosis not present

## 2017-09-21 DIAGNOSIS — Z66 Do not resuscitate: Secondary | ICD-10-CM | POA: Diagnosis present

## 2017-09-21 DIAGNOSIS — D631 Anemia in chronic kidney disease: Secondary | ICD-10-CM | POA: Diagnosis present

## 2017-09-21 DIAGNOSIS — B1920 Unspecified viral hepatitis C without hepatic coma: Secondary | ICD-10-CM | POA: Diagnosis present

## 2017-09-21 DIAGNOSIS — Z833 Family history of diabetes mellitus: Secondary | ICD-10-CM | POA: Diagnosis not present

## 2017-09-21 DIAGNOSIS — N184 Chronic kidney disease, stage 4 (severe): Secondary | ICD-10-CM | POA: Diagnosis present

## 2017-09-21 DIAGNOSIS — D696 Thrombocytopenia, unspecified: Secondary | ICD-10-CM | POA: Diagnosis present

## 2017-09-21 DIAGNOSIS — Z803 Family history of malignant neoplasm of breast: Secondary | ICD-10-CM | POA: Diagnosis not present

## 2017-09-21 DIAGNOSIS — K7290 Hepatic failure, unspecified without coma: Secondary | ICD-10-CM | POA: Diagnosis not present

## 2017-09-21 DIAGNOSIS — F17210 Nicotine dependence, cigarettes, uncomplicated: Secondary | ICD-10-CM | POA: Diagnosis present

## 2017-09-21 DIAGNOSIS — Z7189 Other specified counseling: Secondary | ICD-10-CM | POA: Diagnosis not present

## 2017-09-21 DIAGNOSIS — C220 Liver cell carcinoma: Secondary | ICD-10-CM | POA: Diagnosis present

## 2017-09-21 DIAGNOSIS — Z515 Encounter for palliative care: Secondary | ICD-10-CM | POA: Diagnosis present

## 2017-09-21 DIAGNOSIS — E876 Hypokalemia: Secondary | ICD-10-CM | POA: Diagnosis present

## 2017-09-21 LAB — COMPREHENSIVE METABOLIC PANEL
ALT: 76 U/L — AB (ref 17–63)
AST: 77 U/L — ABNORMAL HIGH (ref 15–41)
Albumin: 1.8 g/dL — ABNORMAL LOW (ref 3.5–5.0)
Alkaline Phosphatase: 149 U/L — ABNORMAL HIGH (ref 38–126)
Anion gap: 11 (ref 5–15)
BUN: 40 mg/dL — ABNORMAL HIGH (ref 6–20)
CHLORIDE: 101 mmol/L (ref 101–111)
CO2: 20 mmol/L — ABNORMAL LOW (ref 22–32)
CREATININE: 2.04 mg/dL — AB (ref 0.61–1.24)
Calcium: 8.6 mg/dL — ABNORMAL LOW (ref 8.9–10.3)
GFR, EST AFRICAN AMERICAN: 39 mL/min — AB (ref >60)
GFR, EST NON AFRICAN AMERICAN: 33 mL/min — AB (ref >60)
Glucose, Bld: 115 mg/dL — ABNORMAL HIGH (ref 65–99)
POTASSIUM: 3.3 mmol/L — AB (ref 3.5–5.1)
Sodium: 132 mmol/L — ABNORMAL LOW (ref 135–145)
Total Bilirubin: 7.8 mg/dL — ABNORMAL HIGH (ref 0.3–1.2)
Total Protein: 5.8 g/dL — ABNORMAL LOW (ref 6.5–8.1)

## 2017-09-21 LAB — CBC WITH DIFFERENTIAL/PLATELET
Basophils Absolute: 0 10*3/uL (ref 0.0–0.1)
Basophils Relative: 0 %
EOS PCT: 0 %
Eosinophils Absolute: 0 10*3/uL (ref 0.0–0.7)
HCT: 31.6 % — ABNORMAL LOW (ref 39.0–52.0)
Hemoglobin: 10.9 g/dL — ABNORMAL LOW (ref 13.0–17.0)
LYMPHS ABS: 0.4 10*3/uL — AB (ref 0.7–4.0)
Lymphocytes Relative: 7 %
MCH: 28.9 pg (ref 26.0–34.0)
MCHC: 34.5 g/dL (ref 30.0–36.0)
MCV: 83.8 fL (ref 78.0–100.0)
MONOS PCT: 8 %
Monocytes Absolute: 0.5 10*3/uL (ref 0.1–1.0)
NEUTROS ABS: 5.5 10*3/uL (ref 1.7–7.7)
Neutrophils Relative %: 85 %
PLATELETS: 62 10*3/uL — AB (ref 150–400)
RBC: 3.77 MIL/uL — ABNORMAL LOW (ref 4.22–5.81)
RDW: 28.8 % — AB (ref 11.5–15.5)
WBC: 6.4 10*3/uL (ref 4.0–10.5)

## 2017-09-21 LAB — MAGNESIUM: Magnesium: 1.7 mg/dL (ref 1.7–2.4)

## 2017-09-21 MED ORDER — POTASSIUM CHLORIDE CRYS ER 20 MEQ PO TBCR
40.0000 meq | EXTENDED_RELEASE_TABLET | Freq: Once | ORAL | Status: AC
  Administered 2017-09-21: 40 meq via ORAL
  Filled 2017-09-21: qty 2

## 2017-09-21 MED ORDER — CEPHALEXIN 500 MG PO CAPS
500.0000 mg | ORAL_CAPSULE | Freq: Two times a day (BID) | ORAL | Status: DC
  Administered 2017-09-21: 500 mg via ORAL
  Filled 2017-09-21: qty 1

## 2017-09-21 NOTE — Progress Notes (Signed)
Daily Progress Note   Patient Name: ROBERTA KELLY       Date: 09/21/2017 DOB: 02-24-1956  Age: 62 y.o. MRN#: [id number] Attending Physician: Aline August, MD Primary Care Physician: [name], MD Admit Date: 09/19/2017  Reason for Consultation/Follow-up: Establishing goals of care  Subjective: Met today with Mr. Roudebush and his sister, [name].  He reports to me today that he has really been thinking that home with hospice is his best option.  He still wants to talk with them to confirm his choice, however, today he reports that he has decided that this is what he is planning on doing.  Length of Stay: 0  Current Medications: Scheduled Meds:  . cephALEXin  500 mg Oral Q12H  . cholecalciferol  1,000 Units Oral Daily  . furosemide  40 mg Oral Daily  . lactulose  30 g Oral QID  . pantoprazole  40 mg Oral BID  . rifaximin  550 mg Oral BID  . sodium chloride flush  3 mL Intravenous Q12H  . spironolactone  50 mg Oral BID    Continuous Infusions: . sodium chloride      PRN Meds: sodium chloride, ondansetron, sodium chloride flush  Physical Exam         Acutely ill appearing older man, no acute distress  HENT:  Head: Normocephalic and atraumatic.  Neck: Normal range of motion.  Cardiovascular: Normal rate.  Pulmonary/Chest: Effort normal.  Abdominal:  Ascites  Musculoskeletal:  Very weak  Neurological: He is alert.  Skin: Skin is warm and dry.  Nursing note and vitals reviewed.  Vital Signs: BP 102/78 (BP Location: Right Arm)   Pulse (!) 55   Temp 98.1 F (36.7 C) (Oral)   Resp 15   Ht '5\' 6"'$  (1.676 m)   Wt 69.1 kg (152 lb 5.4 oz)   SpO2 100%   BMI 24.59 kg/m  SpO2: SpO2: 100 % O2 Device: O2 Device: Room Air O2 Flow Rate:    Intake/output summary:    Intake/Output Summary (Last 24 hours) at 09/21/2017 1207 Last data filed at 09/21/2017 1122 Gross per 24 hour  Intake 910 ml  Output 952 ml  Net -42 ml   LBM: Last BM Date: 09/20/17 Baseline Weight: Weight: 79.3 kg (174 lb 13.2 oz) Most recent weight: Weight: 69.1 kg (152 lb  5.4 oz)       Palliative Assessment/Data:    Flowsheet Rows     Most Recent Value  Intake Tab  Referral Department  Hospitalist  Unit at Time of Referral  Med/Surg Unit  Palliative Care Primary Diagnosis  Cancer  Date Notified  09/20/17  Palliative Care Type  Return patient Palliative Care  Reason for referral  Clarify Goals of Care  Date of Admission  09/19/17  Date first seen by Palliative Care  09/20/17  # of days Palliative referral response time  0 Day(s)  # of days IP prior to Palliative referral  1  Clinical Assessment  Psychosocial & Spiritual Assessment  Palliative Care Outcomes      Patient Active Problem List   Diagnosis Date Noted  . Cellulitis 09/19/2017  . chronic Scrotal swelling 09/19/2017  . Hepatic encephalopathy ([place]) 09/10/2017  . CKD (chronic kidney disease), stage IV ([place]) 09/10/2017  . Bradycardia 09/10/2017  . Ascites due to alcoholic cirrhosis ([place])   . AKI (acute kidney injury) ([place]) 08/22/2017  . Scrotal edema 08/22/2017  . Palliative care by specialist   . Decompensation of cirrhosis of liver ([place])   . Anemia 08/05/2017  . Polysubstance abuse ([place]) 08/04/2017  . Upper GI bleed 08/04/2017  . Acute anemia 07/13/2017  . Symptomatic anemia 07/12/2017  . Hyponatremia 07/12/2017  . Smoker 01/08/2017  . Pancytopenia, acquired ([place]) 01/08/2017  . Pain in right leg 10/06/2016  . End stage liver disease ([place]) 09/10/2016  . Palliative care encounter   . Goals of care, counseling/discussion   . Liver mass, right lobe   . Acute kidney injury ([place]) 08/14/2016  . 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 Hepatitis C, chronic ([place]) 01/04/2016  . Right inguinal hernia 09/12/2015  .  Essential hypertension 09/12/2015  . Gastroesophageal reflux disease without esophagitis 09/12/2015  . Protein-calorie malnutrition, severe ([place]) 09/12/2015  . Anemia of chronic disease 09/12/2015  . Arthritis of left knee 09/12/2015  . Cirrhosis of liver with ascites ([place])   . Elevated LFTs   . Alcoholic cirrhosis of liver with ascites ([place]) 07/25/2015  . Macrocytic anemia 07/23/2015  . Alcoholism ([place]) 07/12/2015  . Hepatorenal syndrome ([place]) 06/27/2015  . Encephalopathy, hepatic ([place]) 06/26/2015  . Adenomatous polyps 10/18/2013  . Unspecified vitamin D deficiency 07/13/2013  . Atopic dermatitis 04/28/2013  . Anasarca 04/26/2013    Palliative Care Assessment & Plan   Patient Profile: 62 y.o. male  with past medical history of polysubstance use disorder, alcohol abuse, thrombocytopenia, hemochromatosis, hepatitis C, new diagnosis of hepatocellular carcinoma, chronic kidney disease stage IV admitted on 09/19/2017 with cellultits.  Well known to palliative service as he was just seen by palliative medicine services in April, May 1, and May 18.  Consult ordered for ongoing goals of care discussion.  Recommendations/Plan:  Await hospice evaluation.  Today he reports that he is planning on enrolling in hospice services on discharge.  Discussed how hospice will be able to benefit him and his goals of living as long as he can, feeling as well as he can, and remaining in his own home for as long as possible.  His sister was at bedside and also agreed that she believes this is the best plan moving forward.  Code Status:    Code Status Orders  (From admission, onward)        Start     Ordered   09/21/17 1123  Do not attempt resuscitation (DNR)  Continuous    Question Answer Comment  In  the event of cardiac or respiratory ARREST Do not call a "code blue"   In the event of cardiac or respiratory ARREST Do not perform Intubation, CPR, defibrillation or ACLS   In the event of cardiac or  respiratory ARREST Use medication by any route, position, wound care, and other measures to relive pain and suffering. May use oxygen, suction and manual treatment of airway obstruction as needed for comfort.      09/21/17 1122    Code Status History    Date Active Date Inactive Code Status Order ID Comments User Context   09/19/2017 1343 09/21/2017 1122 Full Code [id number]  Phillips Grout, MD ED   09/10/2017 1542 09/12/2017 1914 Full Code [id number]  Lady Deutscher, MD ED   08/22/2017 1719 08/28/2017 1923 Full Code [id number]  Kerney Elbe, DO Inpatient   08/04/2017 1600 08/08/2017 1651 Full Code [id number]  [name], MD ED   07/12/2017 1234 07/15/2017 1615 Full Code [id number]  Hosie Poisson, MD Inpatient   08/14/2016 1547 08/18/2016 1903 Full Code [id number]  Samella Parr, NP Inpatient   07/30/2016 1918 08/06/2016 2021 Full Code [id number]  Domenic Polite, MD Inpatient   07/15/2016 2009 07/18/2016 1740 Full Code [id number]  Etta Quill, DO ED   06/06/2016 1549 06/10/2016 2056 Full Code [id number]  Edwin Dada, MD Inpatient   05/10/2016 2308 05/11/2016 2136 Full Code [id number]  Ivor Costa, MD ED   04/26/2016 1228 04/28/2016 1638 Full Code [id number]  Cristal Ford, DO ED   09/04/2015 0111 09/07/2015 1645 Full Code [id number]  Rise Patience, MD Inpatient   07/23/2015 2253 07/29/2015 1607 Full Code [id number]  Vianne Bulls, MD ED   06/26/2015 2349 06/29/2015 1815 Full Code [id number]  Theressa Millard, MD Inpatient   04/26/2013 0628 04/29/2013 1847 Full Code [id number]  Bynum Bellows, MD ED       Prognosis:   < 6 months  Discharge Planning:  Home with Hospice most likely  Care plan was discussed with patient and sister  Thank you for allowing the Palliative Medicine Team to assist in the care of this patient.   Total Time 20 Prolonged Time Billed No      Greater than 50%  of this time was spent counseling and coordinating care related to the above assessment and  plan.  Micheline Rough, MD  Please contact Palliative Medicine Team phone at [phone] for questions and concerns.

## 2017-09-21 NOTE — Progress Notes (Signed)
Patient ID: Jose [name], male   DOB: 25-Aug-1955, 62 y.o.   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  Ultrasound was negative for DVT and he was admitted on IV antibiotics for cellulitis.  Assessment & Plan:   Principal Problem:   Cellulitis Active Problems:   Anasarca   Alcoholism ([place])   Alcoholic cirrhosis of liver with ascites (HCC)   Cirrhosis of liver with ascites (HCC)   Anemia of chronic disease   End stage liver disease (HCC)   Polysubstance abuse (HCC)   CKD (chronic kidney disease), stage IV (HCC)   chronic Scrotal swelling   Left lower extremity cellulitis -Improving.  Currently on vancomycin and Rocephin.  Follow cultures.  Lower extremity ultrasound was negative for DVT.  No evidence of abscess at this time. -Switch to oral Keflex  End-stage liver disease with cirrhosis of liver from alcohol abuse and hepatitis C with recurrent ascites/anasarca/scrotal swelling with history of hepatic encephalopathy -Overall prognosis is very poor.  Patient was being set up for outpatient palliative care/hospice for home on recent discharge.   -Palliative care consult appreciate it.  Patient wants to talk to hospice for probable hospice set up at home. -Continue Lasix and spironolactone. -Status  post ultrasound-guided paracentesis and removal of 8.7 L fluid on 09/20/2017 by IR. -Patient was recently evaluated by urology during recent prior admission and had recommended conservative treatment -Continue lactulose and rifaximin  Chronic kidney disease stage IV -Creatinine stable.  Monitor  Alcohol abuse/polysubstance abuse -Monitor for withdrawal -Social worker consult   Chronic anemia and thrombocytopenia -Probably secondary to chronic kidney disease and alcohol abuse with cirrhosis -No signs of bleeding.  Monitor -Worsening platelets.  Hypokalemia -Replace.  Repeat a.m. labs  Generalized deconditioning -Palliative care consulting.  Overall prognosis is poor.  Recommend hospice. -Spoke to patient about CODE STATUS and initially patient wanted to be full code but after discussion with him and explaining overall poor prognosis and the fact that home hospice is being set up, patient has agreed for DNR    DVT prophylaxis: SCDs Code Status: DNR Family Communication: None at bedside Disposition Plan: Probable home with hospice  Consultants: Palliative care Procedures: None Antimicrobials: Vancomycin and Rocephin from 09/19/2017 onwards   Subjective: Patient seen and examined at bedside.  He is awake and denies any overnight fever, nausea or vomiting.  He is hoping to talk to hospice today.  He is leg pain is improving.  Objective: Vitals:   09/20/17 1610 09/20/17 2011 09/21/17 0514 09/21/17 0608  BP: 123/73 120/86 99/78   Pulse:  (!) 55 (!) 52   Resp:  15 15   Temp:  98.2 F (36.8 C) 98.1 F (36.7 C)   TempSrc:  Oral Oral   SpO2:  97% 100%   Weight:    69.1 kg (152 lb 5.4 oz)  Height:        Intake/Output Summary (Last 24 hours) at  09/21/2017 1022 Last data filed at 09/21/2017 0604 Gross per 24 hour  Intake 670 ml  Output 702 ml  Net -32 ml   Filed Weights   09/19/17 2130 09/21/17 0608  Weight: 79.3 kg (174 lb 13.2 oz) 69.1 kg (152 lb 5.4 oz)     Examination:  General exam: Awake.  No acute distress.  Looks older than stated age respiratory system: Bilateral decreased breath sounds at bases with some scattered crackles Cardiovascular system: Rate controlled, S1-S2 heard gastrointestinal system: Abdomen is distended but improved compared to yesterday, soft and nontender with midline umbilical hernia.  Bowel sounds present Central nervous system: Alert and answers some questions. No focal neurological deficits. Moving extremities Extremities: No cyanosis; trace edema  Skin: Left inner thigh and calf is mildly tender and warm , no visible erythema Psychiatry: Flat affect Genitourinary: Massive scrotal swelling    Data Reviewed: I have personally reviewed following labs and imaging studies  CBC: Recent Labs  Lab 09/19/17 0854 09/20/17 0426 09/21/17 0551  WBC 2.6* 4.5 6.4  NEUTROABS 2.3  --  5.5  HGB 11.6* 9.9* 10.9*  HCT 34.8* 31.0* 31.6*  MCV 85.5 88.6 83.8  PLT 102* 74* 62*   Basic Metabolic Panel: Recent Labs  Lab 09/19/17 0854 09/20/17 0426 09/21/17 0551  NA 131* 130* 132*  K 4.4 4.3 3.3*  CL 99* 101 101  CO2 18* 15* 20*  GLUCOSE 117* 210* 115*  BUN 39* 41* 40*  CREATININE 2.07* 2.25* 2.04*  CALCIUM 8.8* 8.4* 8.6*  MG  --   --  1.7   GFR: Estimated Creatinine Clearance: 34.3 mL/min (A) (by C-G formula based on SCr of 2.04 mg/dL (H)). Liver Function Tests: Recent Labs  Lab 09/19/17 0854 09/21/17 0551  AST 150* 77*  ALT 94* 76*  ALKPHOS 187* 149*  BILITOT 12.1* 7.8*  PROT 6.7 5.8*  ALBUMIN 2.4* 1.8*   No results for input(s): LIPASE, AMYLASE in the last 168 hours. No results for input(s): AMMONIA in the last 168 hours. Coagulation Profile: Recent Labs  Lab 09/19/17 0854  INR 1.41   Cardiac Enzymes: No results for input(s): CKTOTAL, CKMB, CKMBINDEX, TROPONINI in the last 168 hours. BNP (last 3 results) No results for input(s): PROBNP in the last 8760 hours. HbA1C: No results for  input(s): HGBA1C in the last 72 hours. CBG: No results for input(s): GLUCAP in the last 168 hours. Lipid Profile: No results for input(s): CHOL, HDL, LDLCALC, TRIG, CHOLHDL, LDLDIRECT in the last 72 hours. Thyroid Function Tests: No results for input(s): TSH, T4TOTAL, FREET4, T3FREE, THYROIDAB in the last 72 hours. Anemia Panel: No results for input(s): VITAMINB12, FOLATE, FERRITIN, TIBC, IRON, RETICCTPCT in the last 72 hours. Sepsis Labs: No results for input(s): PROCALCITON, LATICACIDVEN in the last 168 hours.  Recent Results (from the past 240 hour(s))  Gram stain     Status: None   Collection Time: 09/20/17  4:15 PM  Result Value Ref Range Status   Specimen Description PERITONEAL  Final   Special Requests NONE  Final   Gram Stain   Final    WBC PRESENT, PREDOMINANTLY MONONUCLEAR NO ORGANISMS SEEN CYTOSPIN SMEAR Performed at [facility], [address], [place],  [address]    Report Status 09/20/2017 FINAL  Final         Radiology Studies: US Paracentesis  Result Date: 09/21/2017 INDICATION: Patient with recurrent ascites. Request is made for diagnostic and therapeutic paracentesis. EXAM: ULTRASOUND GUIDED DIAGNOSTIC AND THERAPEUTIC PARACENTESIS MEDICATIONS: 10  mL 1% lidocaine COMPLICATIONS: None immediate. PROCEDURE: Informed written consent was obtained from the patient after a discussion of the risks, benefits and alternatives to treatment. A timeout was performed prior to the initiation of the procedure. Initial ultrasound scanning demonstrates a large amount of ascites within the left lateral abdomen. The left lateral abdomen was prepped and draped in the usual sterile fashion. 1% lidocaine with epinephrine was used for local anesthesia. Following this, a 19 gauge, 7-cm, Yueh catheter was introduced. An ultrasound image was saved for documentation purposes. The paracentesis was performed. The catheter was removed and a dressing was applied. The patient  tolerated the procedure well without immediate post procedural complication. FINDINGS: A total of approximately 8.7 liters of clear, yellow fluid was removed. Samples were sent to the laboratory as requested by the clinical team. IMPRESSION: Successful ultrasound-guided diagnostic and therapeutic paracentesis yielding 8.7 liters of peritoneal fluid. Read by: Brynda Greathouse PA-C Electronically Signed   By: Lucrezia Europe M.D.   On: 09/20/2017 16:08        Scheduled Meds: . cholecalciferol  1,000 Units Oral Daily  . furosemide  40 mg Oral Daily  . lactulose  30 g Oral QID  . pantoprazole  40 mg Oral BID  . potassium chloride  40 mEq Oral Once  . rifaximin  550 mg Oral BID  . sodium chloride flush  3 mL Intravenous Q12H  . spironolactone  50 mg Oral BID   Continuous Infusions: . sodium chloride    . cefTRIAXone (ROCEPHIN)  IV Stopped (09/20/17 1458)  . vancomycin Stopped (09/20/17 1812)     LOS: 0 days        Aline August, MD [facility] Pager [phone]  If 7PM-7AM, please contact night-coverage [email or web address] Password [facility] 09/21/2017, 10:22 AM

## 2017-09-21 NOTE — Progress Notes (Signed)
Was informed by patient that he wanted to leave AMA. Was educated about how leaving could negatively influence his health and insurance payment status. MD was paged, given order to remove foley. Foley removed. Hospice and [facility] was notified and informed of the patient leaving AMA. A call back number was provided for them in case any questions arose.

## 2017-09-21 NOTE — Progress Notes (Signed)
Patient will discharge with  [facility] at home services. No other CSW needs identified.  Kathrin Greathouse, Latanya Presser, MSW Clinical Social Worker  [phone] 09/21/2017  12:29 PM

## 2017-09-21 NOTE — Progress Notes (Addendum)
[facility] Liaison: RN visit   Notified by Claudie Leach, [facility] of patient/family request for [facility] services at home after discharge. Chart and patient information under reviewed by [facility] physician. Hospice eligibility approved.   Writer spoke with patient and sister, [name] at bedside to initiate education related to hospice philosophy, services and team approach to care. [name] verbalized understanding of information given. Discharge date not yet determined.   Please send signed and completed DNR form home with patient/family. Patient will need prescriptions for discharge comfort medications.   DME needs have been discussed, patient currently has the following equipment in the home: walker. Patient/family requests the following DME for delivery to the home: Hospital bed, OBT, 3N1, shower chair. [facility] equipment manager has been notified and will contact [place] to arrange delivery to the home. Home address has been verified and is correct in the chart. [name] is the family member to contact to arrange time of delivery.   [facility] aware of the above. Please notify [facility] when patient is ready to leave the unit at discharge. (Call [phone] or [phone] after 5pm.) [facility] information and contact numbers given to at time of visit. Above information shared with CMRN.   Please call with any hospice related questions.   Thank you for this referral.   Farrel Gordon, RN, [facility] Liaison  [phone]  ?  [facility] liaisons are now on [place].

## 2017-09-22 ENCOUNTER — Telehealth: Payer: Self-pay

## 2017-09-22 NOTE — Discharge Summary (Signed)
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  Ultrasound was negative for DVT and he was admitted on IV antibiotics for cellulitis.  Palliative care was consulted for poor prognosis.  Home hospice was being set up.  Patient signed out [place] on 09/21/2017.    Discharge Diagnoses:  Principal Problem:   Cellulitis Active Problems:   Anasarca   Alcoholism ([place])   Alcoholic cirrhosis of liver with ascites (HCC)   Cirrhosis of liver with ascites (HCC)   Anemia of chronic disease   End stage liver disease (HCC)   Polysubstance abuse (HCC)   CKD (chronic kidney disease), stage IV (HCC)   chronic Scrotal swelling  Left lower extremity cellulitis End-stage liver disease with cirrhosis of liver from alcohol abuse and hepatitis C with recurrent ascites/anasarca/scrotal swelling with history of hepatic encephalopathy Chronic kidney disease stage IV Alcohol abuse/poor substance abuse Chronic anemia and thrombocytopenia Hypokalemia Generalized deconditioning  Plan: Patient was treated with intravenous antibiotics and was switched to oral antibiotics.  Left lower extremity  ultrasound duplex was negative for DVT.  Patient was evaluated by palliative care and hospice and home hospice was being set up.  Patient also had ultrasound-guided paracentesis and 8.7 L fluid removed.  He signed out [place] on 09/21/2017.  Overall prognosis is guarded to poor.   Discharge Instructions     No Known Allergies  Consultations:  IR/palliative care   Procedures/Studies: Ct Head Wo Contrast  Result Date: 09/10/2017 CLINICAL DATA:  62 year old male found unresponsive. EXAM: CT HEAD WITHOUT CONTRAST TECHNIQUE: Contiguous axial images were obtained from the base of the skull through the vertex without intravenous contrast. COMPARISON:  Head CT 07/12/2017 and earlier. FINDINGS: Brain: Patchy and confluent bilateral cerebral white matter hypodensity appears stable since March. Cerebral volume remains within normal limits. No midline shift, ventriculomegaly, mass effect, evidence of mass lesion, intracranial hemorrhage or evidence of cortically based acute infarction. No cortical encephalomalacia identified. Vascular: Calcified atherosclerosis at the skull base. No suspicious intracranial vascular hyperdensity. Skull: No acute osseous abnormality identified. Chronic bilateral lamina papyracea fractures. Sinuses/Orbits: Visualized paranasal sinuses and mastoids are stable and well pneumatized. Chronic bilateral lamina papyracea fractures. Other: Visualized orbits and scalp soft tissues are within normal limits. IMPRESSION: No acute intracranial abnormality. Stable non contrast CT appearance of the brain with moderately advanced chronic cerebral white matter disease. Electronically Signed   By: Genevie Ann M.D.   On: 09/10/2017 12:42   US Paracentesis  Result Date: 09/21/2017 INDICATION: Patient with recurrent ascites. Request is made for diagnostic and therapeutic paracentesis. EXAM: ULTRASOUND GUIDED DIAGNOSTIC AND THERAPEUTIC PARACENTESIS MEDICATIONS: 10 mL 1% lidocaine  COMPLICATIONS: None immediate. PROCEDURE: Informed written consent was obtained from the patient after a discussion of the risks, benefits and alternatives to treatment. A timeout was performed prior to the initiation of the procedure. Initial ultrasound  scanning demonstrates a large amount of ascites within the left lateral abdomen. The left lateral abdomen was prepped and draped in the usual sterile fashion. 1% lidocaine with epinephrine was used for local anesthesia. Following this, a 19 gauge, 7-cm, Yueh catheter was introduced. An ultrasound image was saved for documentation purposes. The paracentesis was performed. The catheter was removed and a dressing was applied. The patient tolerated the procedure well without immediate post procedural complication. FINDINGS: A total of approximately 8.7 liters of clear, yellow fluid was removed. Samples were sent to the laboratory as requested by the clinical team. IMPRESSION: Successful ultrasound-guided diagnostic and therapeutic paracentesis yielding 8.7 liters of peritoneal fluid. Read by: Brynda Greathouse PA-C Electronically Signed   By: Lucrezia Europe M.D.   On: 09/20/2017 16:08   Dg Chest Portable 1 View  Result Date: 09/10/2017 CLINICAL DATA:  Unresponsive, history of hepatitis-C with cirrhosis and liver cancer, had lactulose last night, smoker EXAM: PORTABLE CHEST 1 VIEW COMPARISON:  Portable exam 1103 hours compared to 08/22/2017 FINDINGS: Enlargement of cardiac silhouette likely accentuated by hypoinflation. Atherosclerotic calcification aorta. Low lung volumes with bibasilar atelectasis. Upper lungs clear. No pleural effusion or pneumothorax. Bones demineralized. IMPRESSION: Low lung volumes with bibasilar atelectasis. Electronically Signed   By: Lavonia Dana M.D.   On: 09/10/2017 11:15    Ultrasound-guided paracentesis and removal of 8.7 L fluid on 09/20/2017 by IR    The results of significant diagnostics from this hospitalization (including imaging,  microbiology, ancillary and laboratory) are listed below for reference.     Microbiology: Recent Results (from the past 240 hour(s))  Culture, body fluid-bottle     Status: None (Preliminary result)   Collection Time: 09/20/17  4:15 PM  Result Value Ref Range Status   Specimen Description PERITONEAL  Final   Special Requests NONE  Final   Culture   Final    NO GROWTH < 24 HOURS Performed at [facility], [address] [place], [place] [address]    Report Status PENDING  Incomplete  Gram stain     Status: None   Collection Time: 09/20/17  4:15 PM  Result Value Ref Range Status   Specimen Description PERITONEAL  Final   Special Requests NONE  Final   Gram Stain   Final    WBC PRESENT, PREDOMINANTLY MONONUCLEAR NO ORGANISMS SEEN CYTOSPIN SMEAR Performed at [facility], [place] [address] [place], [place] [address]    Report Status 09/20/2017 FINAL  Final     Labs: BNP (last 3 results) No results for input(s): BNP in the last 8760 hours. Basic Metabolic Panel: Recent Labs  Lab 09/19/17 0854 09/20/17 0426 09/21/17 0551  NA 131* 130* 132*  K 4.4 4.3 3.3*  CL 99* 101 101  CO2 18* 15* 20*  GLUCOSE 117* 210* 115*  BUN 39* 41* 40*  CREATININE 2.07* 2.25* 2.04*  CALCIUM 8.8* 8.4* 8.6*  MG  --   --  1.7   Liver Function Tests: Recent Labs  Lab 09/19/17 0854 09/21/17 0551  AST 150* 77*  ALT 94* 76*  ALKPHOS 187* 149*  BILITOT 12.1* 7.8*  PROT 6.7 5.8*  ALBUMIN 2.4* 1.8*   No results for input(s): LIPASE, AMYLASE in the last 168 hours. No results for input(s): AMMONIA in the last 168 hours. CBC: Recent Labs  Lab 09/19/17 0854 09/20/17 0426 09/21/17 0551  WBC 2.6* 4.5 6.4  NEUTROABS 2.3  --  5.5  HGB 11.6* 9.9* 10.9*  HCT 34.8* 31.0*  31.6*  MCV 85.5 88.6 83.8  PLT 102* 74* 62*   Cardiac Enzymes: No results for input(s): CKTOTAL, CKMB, CKMBINDEX, TROPONINI in the last 168 hours. BNP: Invalid input(s): POCBNP CBG: No results for input(s):  GLUCAP in the last 168 hours. D-Dimer No results for input(s): DDIMER in the last 72 hours. Hgb A1c No results for input(s): HGBA1C in the last 72 hours. Lipid Profile No results for input(s): CHOL, HDL, LDLCALC, TRIG, CHOLHDL, LDLDIRECT in the last 72 hours. Thyroid function studies No results for input(s): TSH, T4TOTAL, T3FREE, THYROIDAB in the last 72 hours.  Invalid input(s): FREET3 Anemia work up No results for input(s): VITAMINB12, FOLATE, FERRITIN, TIBC, IRON, RETICCTPCT in the last 72 hours. Urinalysis    Component Value Date/Time   COLORURINE YELLOW 08/22/2017 1401   APPEARANCEUR HAZY (A) 08/22/2017 1401   LABSPEC 1.008 08/22/2017 1401   PHURINE 5.0 08/22/2017 1401   GLUCOSEU NEGATIVE 08/22/2017 1401   HGBUR NEGATIVE 08/22/2017 1401   BILIRUBINUR NEGATIVE 08/22/2017 1401   KETONESUR NEGATIVE 08/22/2017 1401   PROTEINUR NEGATIVE 08/22/2017 1401   UROBILINOGEN 4.0 (H) 04/26/2013 0500   NITRITE NEGATIVE 08/22/2017 1401   LEUKOCYTESUR NEGATIVE 08/22/2017 1401   Sepsis Labs Invalid input(s): PROCALCITONIN,  WBC,  LACTICIDVEN Microbiology Recent Results (from the past 240 hour(s))  Culture, body fluid-bottle     Status: None (Preliminary result)   Collection Time: 09/20/17  4:15 PM  Result Value Ref Range Status   Specimen Description PERITONEAL  Final   Special Requests NONE  Final   Culture   Final    NO GROWTH < 24 HOURS Performed at [facility], [place] [address] [place], [place] [address]    Report Status PENDING  Incomplete  Gram stain     Status: None   Collection Time: 09/20/17  4:15 PM  Result Value Ref Range Status   Specimen Description PERITONEAL  Final   Special Requests NONE  Final   Gram Stain   Final    WBC PRESENT, PREDOMINANTLY MONONUCLEAR NO ORGANISMS SEEN CYTOSPIN SMEAR Performed at [facility], [place] [address] [place], [place] [address]    Report Status 09/20/2017 FINAL  Final     SIGNED:   Aline August, MD  [facility] 09/22/2017, 1:24 PM Pager: [phone]  If 7PM-7AM, please contact night-coverage [email or web address] Password [email or web address]

## 2017-09-23 NOTE — Telephone Encounter (Signed)
VM left for patient.

## 2017-09-25 LAB — CULTURE, BODY FLUID W GRAM STAIN -BOTTLE: Culture: NO GROWTH

## 2017-09-25 MED FILL — LACTULOSE 10 GM/15 ML SOLUT: 10 | 3 days supply | Qty: 500 | Fill #1

## 2017-10-26 DEATH — deceased

## 2019-09-27 IMAGING — CT CT HEAD W/O CM
3 series · 15 of 47 positions shown, 18 images · non-contrast
Comparison: Brain CT 08/14/2016.

CLINICAL DATA: Patient with left upper extremity numbness.

EXAM:
CT HEAD WITHOUT CONTRAST
TECHNIQUE: Contiguous axial images were obtained from the base of the skull
through the vertex without intravenous contrast.

[Series 2: head wo · axial · 0.42mm/px · z∈[-223,-83]mm · 9 of 34 slices shown, 12 images]
[im 3/34  brain]
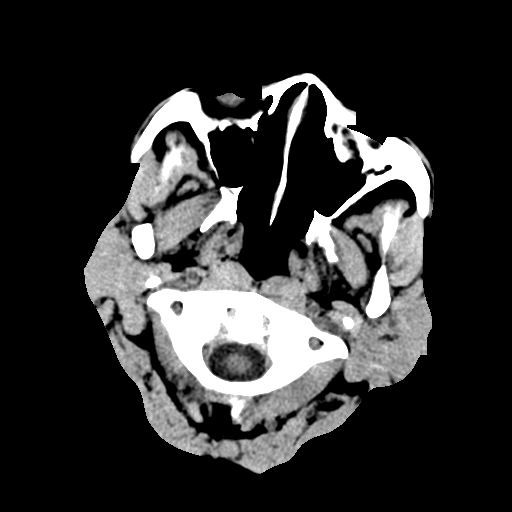
[im 3/34  bone]
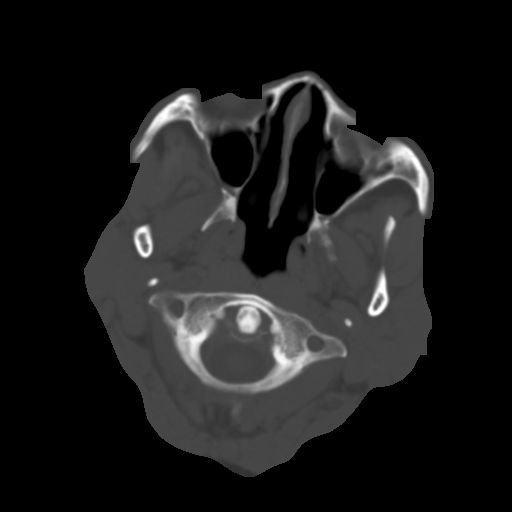
[im 6/34  brain]
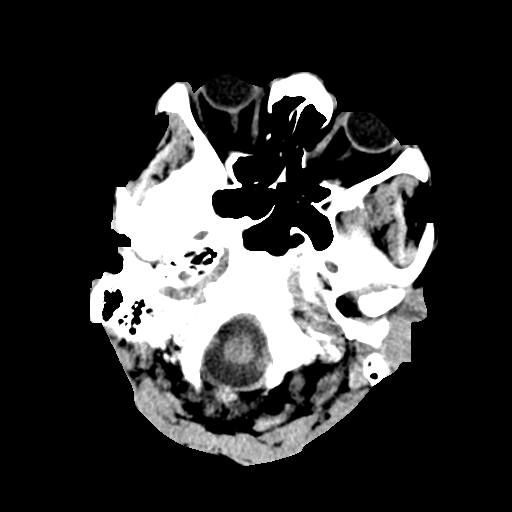
[im 10/34  brain]
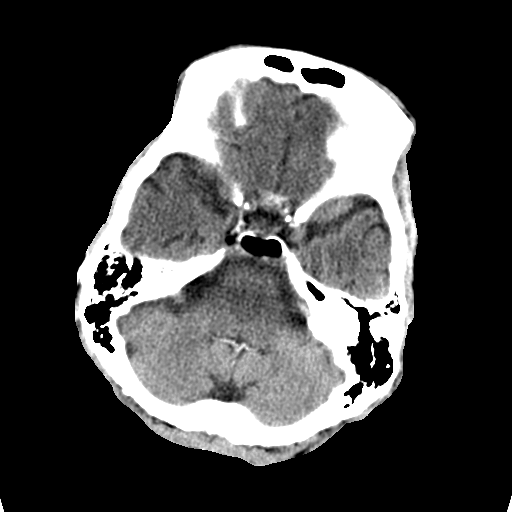
[im 13/34  brain]
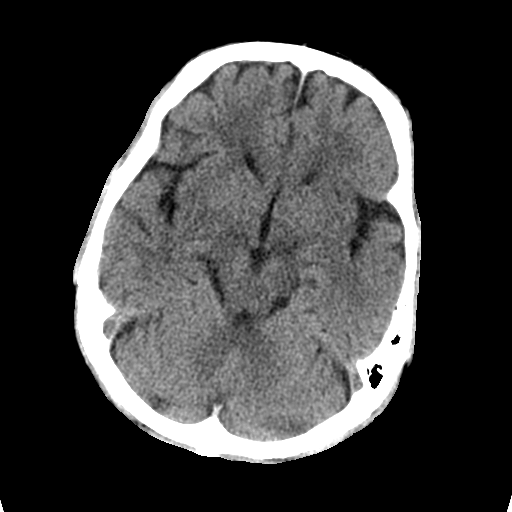
[im 18/34  brain]
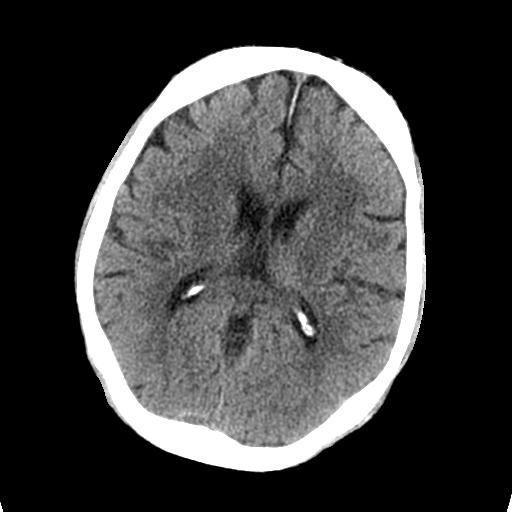
[im 18/34  bone]
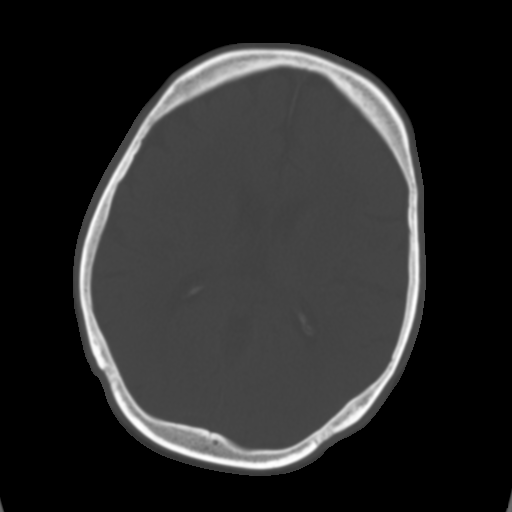
[im 21/34  brain]
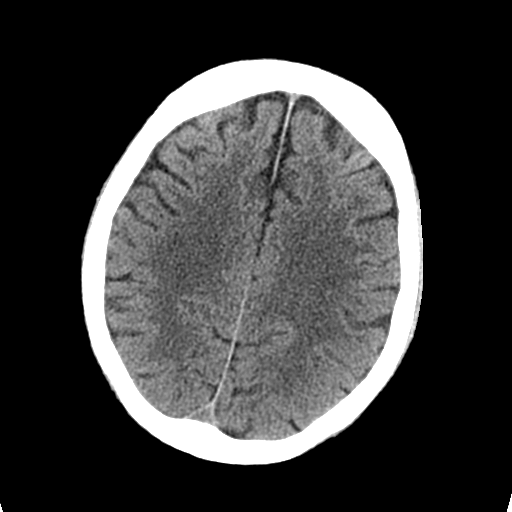
[im 24/34  brain]
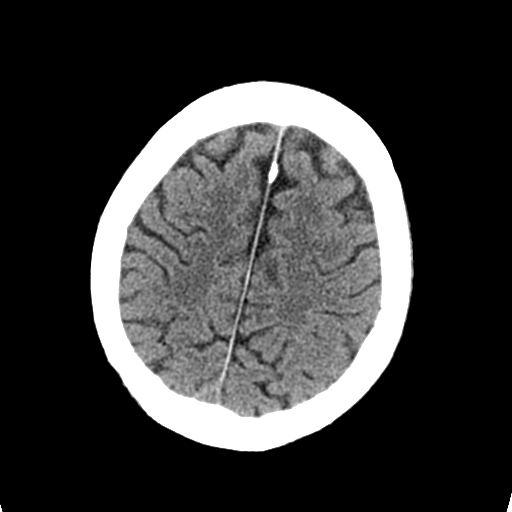
[im 28/34  brain]
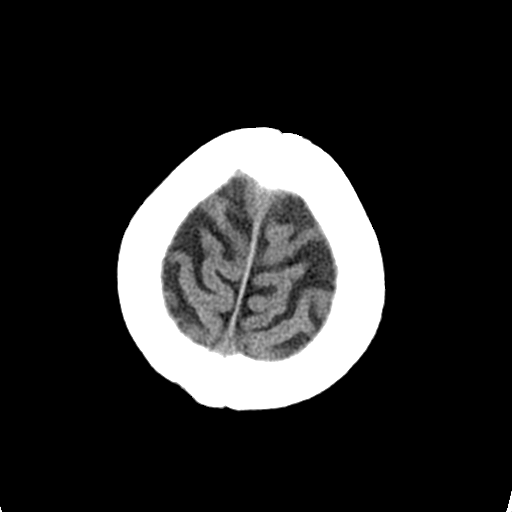
[im 31/34  brain]
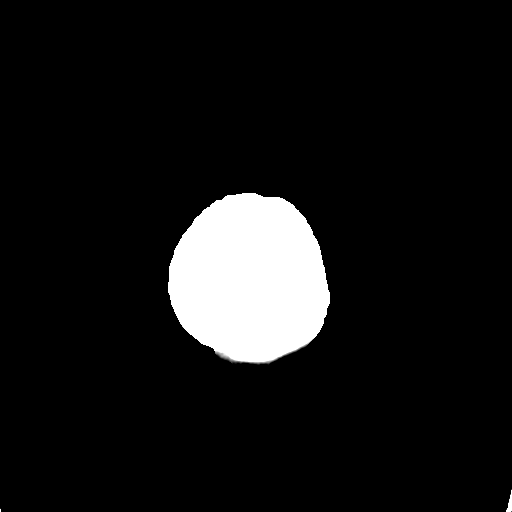
[im 31/34  bone]
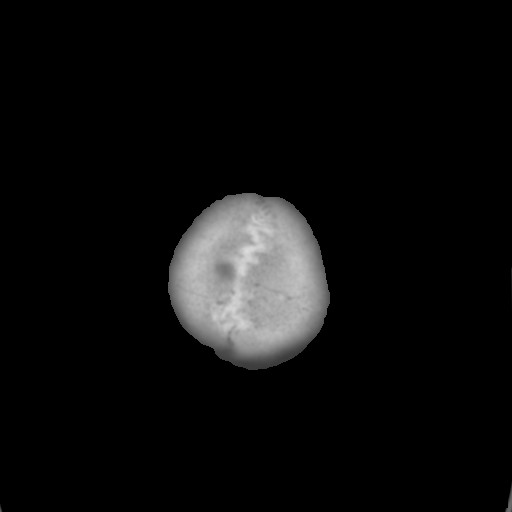

[Series 5: coronal soft tissue · coronal · 0.32mm/px · 3 of 68 slices shown]
[im 23/68  brain]
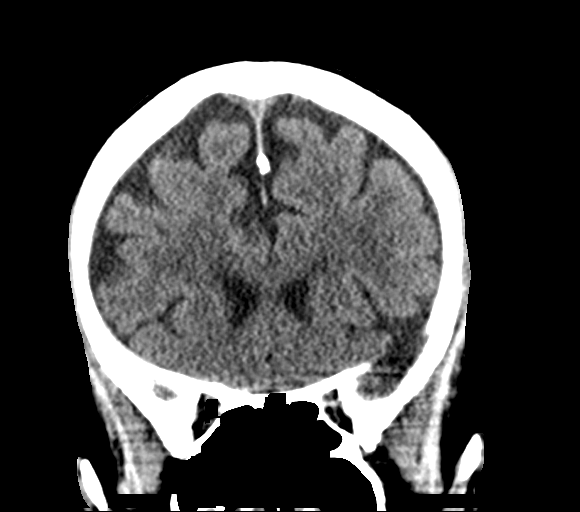
[im 30/68  brain]
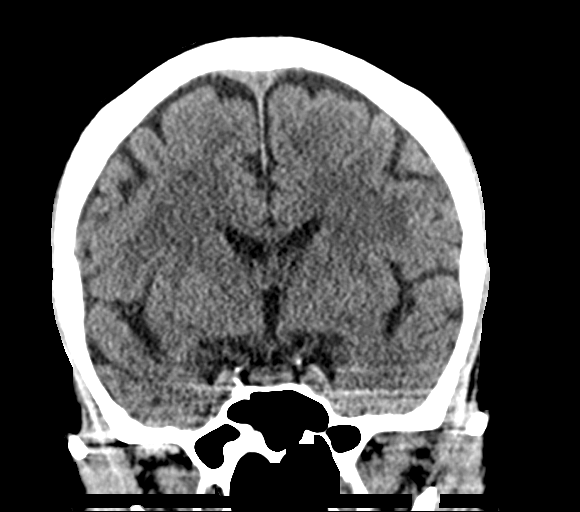
[im 38/68  brain]
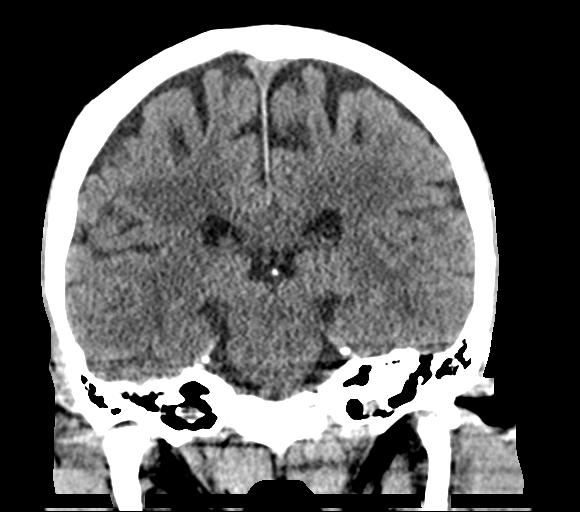

[Series 6: sagittal soft tissue · sagittal · 0.32mm/px · 3 of 53 slices shown]
[im 18/53  brain]
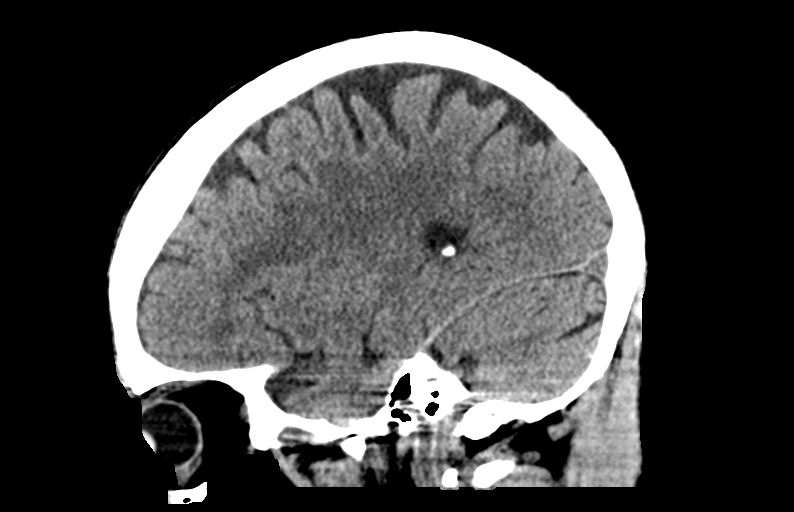
[im 27/53  brain]
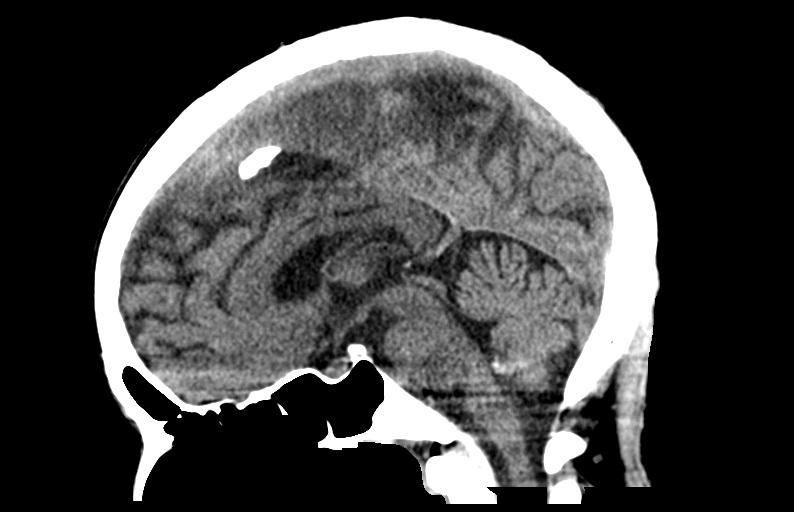
[im 35/53  brain]
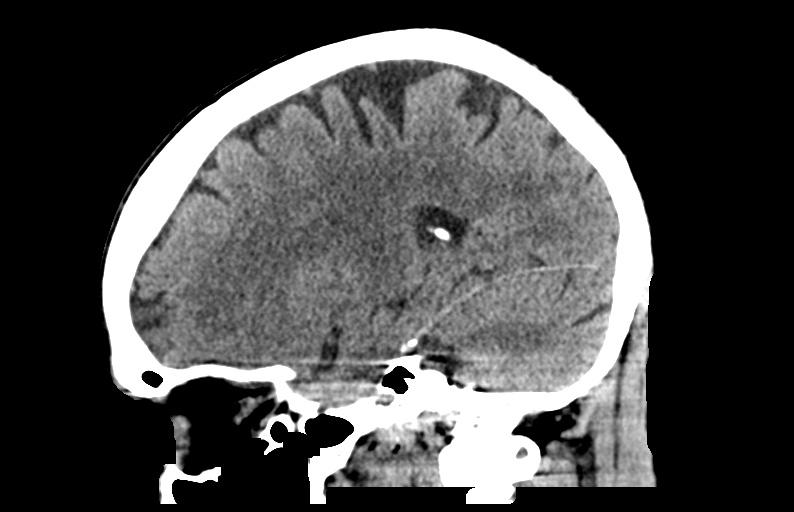

[15 of 47 positions shown; findings below may reference images not displayed]

FINDINGS: Brain: Ventricles and sulci are prominent compatible with atrophy.
Periventricular and subcortical white matter hypodensity compatible
with chronic microvascular ischemic changes. No evidence for acute
cortically based infarct, intracranial hemorrhage, mass lesion or
mass-effect.

Vascular: Unremarkable.

Skull: Intact.

Sinuses/Orbits: Paranasal sinuses are well aerated. Mastoid air
cells unremarkable. Orbits unremarkable. Chronic deformity of the
medial orbital walls bilaterally.

Other: None.
IMPRESSION: No acute intracranial process.

Atrophy and chronic microvascular ischemic changes.
